# Patient Record
Sex: Male | Born: 1945 | Marital: Married | Smoking: Never smoker
Health system: Southern US, Community
[De-identification: ages and names within clinical notes are randomized; demographics above are authoritative.]

## PROBLEM LIST (undated history)

## (undated) DIAGNOSIS — D689 Coagulation defect, unspecified: Secondary | ICD-10-CM

## (undated) DIAGNOSIS — E119 Type 2 diabetes mellitus without complications: Secondary | ICD-10-CM

## (undated) DIAGNOSIS — H9190 Unspecified hearing loss, unspecified ear: Secondary | ICD-10-CM

## (undated) DIAGNOSIS — N186 End stage renal disease: Secondary | ICD-10-CM

## (undated) DIAGNOSIS — N189 Chronic kidney disease, unspecified: Secondary | ICD-10-CM

## (undated) DIAGNOSIS — E785 Hyperlipidemia, unspecified: Secondary | ICD-10-CM

## (undated) DIAGNOSIS — E039 Hypothyroidism, unspecified: Secondary | ICD-10-CM

## (undated) DIAGNOSIS — I10 Essential (primary) hypertension: Secondary | ICD-10-CM

## (undated) DIAGNOSIS — E079 Disorder of thyroid, unspecified: Secondary | ICD-10-CM

## (undated) DIAGNOSIS — K219 Gastro-esophageal reflux disease without esophagitis: Secondary | ICD-10-CM

## (undated) HISTORY — DX: Coagulation defect, unspecified: D68.9

## (undated) HISTORY — DX: Type 2 diabetes mellitus without complications: E11.9

## (undated) HISTORY — DX: Disorder of thyroid, unspecified: E07.9

## (undated) HISTORY — DX: Hyperlipidemia, unspecified: E78.5

## (undated) HISTORY — DX: Essential (primary) hypertension: I10

---

## 2004-02-25 ENCOUNTER — Emergency Department: Payer: Self-pay | Admitting: General Practice

## 2004-08-19 ENCOUNTER — Emergency Department: Payer: Self-pay | Admitting: Emergency Medicine

## 2004-09-29 ENCOUNTER — Ambulatory Visit: Payer: Self-pay | Admitting: General Surgery

## 2004-09-30 ENCOUNTER — Ambulatory Visit: Payer: Self-pay | Admitting: Internal Medicine

## 2005-12-30 ENCOUNTER — Ambulatory Visit: Payer: Self-pay | Admitting: Internal Medicine

## 2007-12-24 ENCOUNTER — Emergency Department: Payer: Self-pay | Admitting: Emergency Medicine

## 2010-01-07 ENCOUNTER — Ambulatory Visit: Payer: Self-pay | Admitting: Podiatry

## 2012-06-27 ENCOUNTER — Ambulatory Visit: Payer: Self-pay | Admitting: Ophthalmology

## 2012-07-05 ENCOUNTER — Ambulatory Visit: Payer: Self-pay | Admitting: Ophthalmology

## 2012-07-19 ENCOUNTER — Ambulatory Visit: Payer: Self-pay | Admitting: Ophthalmology

## 2012-08-02 ENCOUNTER — Ambulatory Visit: Payer: Self-pay | Admitting: Ophthalmology

## 2013-09-11 ENCOUNTER — Inpatient Hospital Stay: Payer: Self-pay | Admitting: Internal Medicine

## 2013-09-11 LAB — CBC WITH DIFFERENTIAL/PLATELET
BASOS PCT: 0.6 %
Basophil #: 0.1 10*3/uL (ref 0.0–0.1)
EOS PCT: 1 %
Eosinophil #: 0.2 10*3/uL (ref 0.0–0.7)
HCT: 41.7 % (ref 40.0–52.0)
HGB: 13.4 g/dL (ref 13.0–18.0)
Lymphocyte #: 1.5 10*3/uL (ref 1.0–3.6)
Lymphocyte %: 9.8 %
MCH: 27.2 pg (ref 26.0–34.0)
MCHC: 32.1 g/dL (ref 32.0–36.0)
MCV: 85 fL (ref 80–100)
MONOS PCT: 3.3 %
Monocyte #: 0.5 x10 3/mm (ref 0.2–1.0)
Neutrophil #: 13.3 10*3/uL — ABNORMAL HIGH (ref 1.4–6.5)
Neutrophil %: 85.3 %
PLATELETS: 280 10*3/uL (ref 150–440)
RBC: 4.92 10*6/uL (ref 4.40–5.90)
RDW: 15.2 % — AB (ref 11.5–14.5)
WBC: 15.6 10*3/uL — AB (ref 3.8–10.6)

## 2013-09-11 LAB — TROPONIN I
Troponin-I: <0.02 ng/mL
Troponin-I: <0.02 ng/mL

## 2013-09-11 LAB — COMPREHENSIVE METABOLIC PANEL
ALT: 39 U/L (ref 12–78)
ANION GAP: 5 — AB (ref 7–16)
Albumin: 3.7 g/dL (ref 3.4–5.0)
Alkaline Phosphatase: 134 U/L — ABNORMAL HIGH
BUN: 31 mg/dL — ABNORMAL HIGH (ref 7–18)
Bilirubin,Total: 0.4 mg/dL (ref 0.2–1.0)
CALCIUM: 9.4 mg/dL (ref 8.5–10.1)
CHLORIDE: 95 mmol/L — AB (ref 98–107)
CO2: 30 mmol/L (ref 21–32)
Creatinine: 2.64 mg/dL — ABNORMAL HIGH (ref 0.60–1.30)
EGFR (Non-African Amer.): 24 — ABNORMAL LOW
GFR CALC AF AMER: 28 — AB
Glucose: 407 mg/dL — ABNORMAL HIGH (ref 65–99)
OSMOLALITY: 284 (ref 275–301)
Potassium: 4 mmol/L (ref 3.5–5.1)
SGOT(AST): 32 U/L (ref 15–37)
SODIUM: 130 mmol/L — AB (ref 136–145)
TOTAL PROTEIN: 8.1 g/dL (ref 6.4–8.2)

## 2013-09-11 LAB — URINALYSIS, COMPLETE
Bacteria: NONE SEEN
Bilirubin,UR: NEGATIVE
Blood: NEGATIVE
Ketone: NEGATIVE
LEUKOCYTE ESTERASE: NEGATIVE
NITRITE: NEGATIVE
Ph: 7 (ref 4.5–8.0)
RBC, UR: NONE SEEN /HPF (ref 0–5)
SQUAMOUS EPITHELIAL: NONE SEEN
Specific Gravity: 1.016 (ref 1.003–1.030)
WBC UR: <1 /HPF (ref 0–5)

## 2013-09-11 LAB — CK TOTAL AND CKMB (NOT AT ARMC)
CK, TOTAL: 62 U/L
CK, Total: 53 U/L
CK-MB: 1.1 ng/mL (ref 0.5–3.6)
CK-MB: 1.3 ng/mL (ref 0.5–3.6)

## 2013-09-11 LAB — HEMOGLOBIN A1C: Hemoglobin A1C: 13.1 % — ABNORMAL HIGH (ref 4.2–6.3)

## 2013-09-12 DIAGNOSIS — N179 Acute kidney failure, unspecified: Secondary | ICD-10-CM

## 2013-09-12 LAB — BASIC METABOLIC PANEL
Anion Gap: 5 — ABNORMAL LOW (ref 7–16)
BUN: 28 mg/dL — AB (ref 7–18)
Calcium, Total: 8.7 mg/dL (ref 8.5–10.1)
Chloride: 102 mmol/L (ref 98–107)
Co2: 27 mmol/L (ref 21–32)
Creatinine: 2.23 mg/dL — ABNORMAL HIGH (ref 0.60–1.30)
EGFR (African American): 34 — ABNORMAL LOW
EGFR (Non-African Amer.): 29 — ABNORMAL LOW
Glucose: 369 mg/dL — ABNORMAL HIGH (ref 65–99)
Osmolality: 289 (ref 275–301)
Potassium: 4.4 mmol/L (ref 3.5–5.1)
Sodium: 134 mmol/L — ABNORMAL LOW (ref 136–145)

## 2013-09-12 LAB — CBC WITH DIFFERENTIAL/PLATELET
BASOS ABS: 0 10*3/uL (ref 0.0–0.1)
BASOS PCT: 0.4 %
EOS PCT: 0.1 %
Eosinophil #: 0 10*3/uL (ref 0.0–0.7)
HCT: 36.5 % — AB (ref 40.0–52.0)
HGB: 12 g/dL — AB (ref 13.0–18.0)
LYMPHS PCT: 12.7 %
Lymphocyte #: 1.2 10*3/uL (ref 1.0–3.6)
MCH: 27.6 pg (ref 26.0–34.0)
MCHC: 32.8 g/dL (ref 32.0–36.0)
MCV: 84 fL (ref 80–100)
MONOS PCT: 5.1 %
Monocyte #: 0.5 x10 3/mm (ref 0.2–1.0)
Neutrophil #: 7.7 10*3/uL — ABNORMAL HIGH (ref 1.4–6.5)
Neutrophil %: 81.7 %
Platelet: 241 10*3/uL (ref 150–440)
RBC: 4.34 10*6/uL — ABNORMAL LOW (ref 4.40–5.90)
RDW: 15.4 % — ABNORMAL HIGH (ref 11.5–14.5)
WBC: 9.4 10*3/uL (ref 3.8–10.6)

## 2013-09-12 LAB — CK TOTAL AND CKMB (NOT AT ARMC)
CK, Total: 51 U/L
CK-MB: 1 ng/mL (ref 0.5–3.6)

## 2013-09-12 LAB — URIC ACID: URIC ACID: 6.4 mg/dL (ref 3.5–7.2)

## 2013-09-12 LAB — HEMOGLOBIN A1C: Hemoglobin A1C: 13.2 % — ABNORMAL HIGH (ref 4.2–6.3)

## 2013-09-12 LAB — TROPONIN I: Troponin-I: <0.02 ng/mL

## 2013-09-13 LAB — PROTEIN / CREATININE RATIO, URINE
Creatinine, Urine: 120.1 mg/dL (ref 30.0–125.0)
PROTEIN, RANDOM URINE: 131 mg/dL — AB (ref 0–12)
Protein/Creat. Ratio: 1091 mg/gCREAT — ABNORMAL HIGH (ref 0–200)

## 2013-09-13 LAB — RENAL FUNCTION PANEL
Albumin: 2.8 g/dL — ABNORMAL LOW (ref 3.4–5.0)
Anion Gap: 4 — ABNORMAL LOW (ref 7–16)
BUN: 20 mg/dL — AB (ref 7–18)
Calcium, Total: 8.3 mg/dL — ABNORMAL LOW (ref 8.5–10.1)
Chloride: 110 mmol/L — ABNORMAL HIGH (ref 98–107)
Co2: 26 mmol/L (ref 21–32)
Creatinine: 1.84 mg/dL — ABNORMAL HIGH (ref 0.60–1.30)
EGFR (Non-African Amer.): 37 — ABNORMAL LOW
GFR CALC AF AMER: 43 — AB
Glucose: 148 mg/dL — ABNORMAL HIGH (ref 65–99)
OSMOLALITY: 285 (ref 275–301)
Phosphorus: 2.6 mg/dL (ref 2.5–4.9)
Potassium: 4.1 mmol/L (ref 3.5–5.1)
Sodium: 140 mmol/L (ref 136–145)

## 2013-09-13 LAB — TSH: Thyroid Stimulating Horm: 0.525 u[IU]/mL

## 2013-09-14 LAB — BASIC METABOLIC PANEL
Anion Gap: 5 — ABNORMAL LOW (ref 7–16)
BUN: 17 mg/dL (ref 7–18)
CALCIUM: 8.7 mg/dL (ref 8.5–10.1)
CHLORIDE: 108 mmol/L — AB (ref 98–107)
Co2: 26 mmol/L (ref 21–32)
Creatinine: 1.71 mg/dL — ABNORMAL HIGH (ref 0.60–1.30)
EGFR (Non-African Amer.): 41 — ABNORMAL LOW
GFR CALC AF AMER: 47 — AB
GLUCOSE: 146 mg/dL — AB (ref 65–99)
Osmolality: 282 (ref 275–301)
Potassium: 4.3 mmol/L (ref 3.5–5.1)
SODIUM: 139 mmol/L (ref 136–145)

## 2013-09-14 LAB — UR PROT ELECTROPHORESIS, URINE RANDOM

## 2013-10-03 ENCOUNTER — Ambulatory Visit: Payer: Self-pay | Admitting: Unknown Physician Specialty

## 2013-10-14 DIAGNOSIS — K5900 Constipation, unspecified: Secondary | ICD-10-CM | POA: Insufficient documentation

## 2014-03-15 ENCOUNTER — Ambulatory Visit: Payer: Self-pay | Admitting: Unknown Physician Specialty

## 2014-05-28 ENCOUNTER — Ambulatory Visit: Payer: Self-pay | Admitting: Internal Medicine

## 2014-05-31 DIAGNOSIS — G301 Alzheimer's disease with late onset: Secondary | ICD-10-CM | POA: Insufficient documentation

## 2014-08-13 ENCOUNTER — Ambulatory Visit: Payer: Self-pay | Admitting: Internal Medicine

## 2014-09-13 DIAGNOSIS — I2699 Other pulmonary embolism without acute cor pulmonale: Secondary | ICD-10-CM | POA: Insufficient documentation

## 2014-09-14 NOTE — Op Note (Signed)
PATIENT NAME:  Joseph Mcpherson, Joseph Mcpherson MR#:  [id number] DATE OF BIRTH:  20-Mar-1946  DATE OF PROCEDURE:  08/02/2012  PREOPERATIVE DIAGNOSIS: Visually significant cataract of the right eye.   POSTOPERATIVE DIAGNOSIS: Visually significant cataract of the right eye.   OPERATIVE PROCEDURE: Cataract extraction by phacoemulsification with implant of intraocular lens to right eye.   SURGEON: Birder Robson, MD.   ANESTHESIA:  1. Managed anesthesia care.  2. Topical tetracaine drops followed by 2% Xylocaine jelly applied in the preoperative holding area.   COMPLICATIONS: None.   TECHNIQUE:  Stop and chop.    DESCRIPTION OF PROCEDURE: The patient was examined and consented in the preoperative holding area where the aforementioned topical anesthesia was applied to the right eye and then brought back to the Operating Room where the right eye was prepped and draped in the usual sterile ophthalmic fashion and a lid speculum was placed. A paracentesis was created with the side port blade and the anterior chamber was filled with viscoelastic. A near clear corneal incision was performed with the steel keratome. A continuous curvilinear capsulorrhexis was performed with a cystotome followed by the capsulorrhexis forceps. Hydrodissection and hydrodelineation were carried out with BSS on a blunt cannula. The lens was removed in a stop and chop technique and the remaining cortical material was removed with the irrigation-aspiration handpiece. The capsular bag was inflated with viscoelastic and the Tecnis ZCB00 20.0-diopter lens, serial [id number] was placed in the capsular bag without complication. The remaining viscoelastic was removed from the eye with the irrigation-aspiration handpiece. The wounds were hydrated. The anterior chamber was flushed with Miostat and the eye was inflated to physiologic pressure. 0.1 mL of cefuroxime concentration 10 mg/mL was placed in the anterior chamber. The wounds were found to be water  tight. The eye was dressed with Vigamox. The patient was given protective glasses to wear throughout the day and a shield with which to sleep tonight. The patient was also given drops with which to begin a drop regimen today and will follow-up with me in one day.     ____________________________ Livingston Diones. Porfilio, MD wlp:cc D: 08/02/2012 21:55:02 ET T: 08/02/2012 23:45:29 ET JOB#: [id number]  cc: William L. Porfilio, MD, <Dictator> Livingston Diones PORFILIO MD ELECTRONICALLY SIGNED 08/05/2012 17:10

## 2014-09-14 NOTE — Op Note (Signed)
PATIENT NAME:  Joseph Mcpherson, Joseph Mcpherson MR#:  [id number] DATE OF BIRTH:  03/23/1946  DATE OF PROCEDURE:  07/05/2012  PREOPERATIVE DIAGNOSIS: Visually significant cataract of the left eye.   POSTOPERATIVE DIAGNOSIS: Visually significant cataract of the left eye.   OPERATIVE PROCEDURE: Cataract extraction by phacoemulsification with implant of intraocular lens to left eye.   SURGEON: Birder Robson, MD.   ANESTHESIA:  1. Managed anesthesia care.  2. Topical tetracaine drops followed by 2% Xylocaine jelly applied in the preoperative holding area.   COMPLICATIONS: None.   TECHNIQUE:  Stop and chop.   DESCRIPTION OF PROCEDURE: The patient was examined and consented in the preoperative holding area where the aforementioned topical anesthesia was applied to the left eye and then brought back to the Operating Room where the left eye was prepped and draped in the usual sterile ophthalmic fashion and a lid speculum was placed. A paracentesis was created with the side port blade and the anterior chamber was filled with viscoelastic. A near clear corneal incision was performed with the steel keratome. A continuous curvilinear capsulorrhexis was performed with a cystotome followed by the capsulorrhexis forceps. Hydrodissection and hydrodelineation were carried out with BSS on a blunt cannula. The lens was removed in a stop and chop technique and the remaining cortical material was removed with the irrigation-aspiration handpiece. The capsular bag was inflated with viscoelastic and the Alcon SN60WF Tecnis ZCB00 20.0-diopter lens, serial number [id number] was placed in the capsular bag without complication. The remaining viscoelastic was removed from the eye with the irrigation-aspiration handpiece. The wounds were hydrated. The anterior chamber was flushed with Miostat and the eye was inflated to physiologic pressure. 0.1 mL of cefuroxime concentration 10 mg/mL was placed in the anterior chamber. The wounds were found to  be water tight. The eye was dressed with Vigamox. The patient was given protective glasses to wear throughout the day and a shield with which to sleep tonight. The patient was also given drops with which to begin a drop regimen today and will follow-up with me in one day.      ____________________________ Livingston Diones. Dalonte Hardage, MD wlp:ea D: 07/05/2012 22:52:51 ET T: 07/06/2012 04:12:17 ET JOB#: [id number]  cc: Avedis Bevis L. Tyeisha Dinan, MD, <Dictator> Livingston Diones Amado Andal MD ELECTRONICALLY SIGNED 07/18/2012 11:53

## 2014-09-15 NOTE — Discharge Summary (Signed)
Dates of Admission and Diagnosis:  Date of Admission 11-Sep-2013   Date of Discharge 14-Sep-2013   Admitting Diagnosis Uncontrolled DM   Final Diagnosis Uncontrolled DM Hypertensive Urgency ARF due to dehydration nausea and vomiting    Chief Complaint/History of Present Illness The patient is a 69 year old male with a history of diabetes mellitus, insulin-dependent; hypertension; hyperlipidemia presented to the Emergency Department with the complaints of dizziness. The patient was feeling well last night. Woke up this morning, started to experience dizziness. Tried to walk to the kitchen, extremely wobbly. In the kitchen, fell forward.  He started experiencing nausea. Had 2 episodes of vomiting.  We checked his blood sugars.  It was found to be 109.  As the patient continued to have these symptoms, he came to the Emergency Department.   WORKUP IN THE EMERGENCY DEPARTMENT:  Patient is found to have multiple abnormalities.  Patient's blood pressure is 200/95. Blood sugars of 409. The patient is also found to have acute renal failure with a creatinine of 2.64, sodium of 130. The patient does not know patient's baseline creatinine.  We do not have any labs from any previous visits.  CT head without contrast: No acute intracranial abnormality. The patient denies having any fever.  Patient was found to have a white blood cell count of 15.6 with a left shift; however, no obvious signs of infection are noted. Chest x-ray does not show any infiltrates. Urinalysis: Negative for any urinary tract infection. The patient is afebrile in the Emergency Department.   Allergies:  No Known Allergies:   Hepatic:  20-Apr-15 16:07   Albumin, Serum 3.7  Bilirubin, Total 0.4  Alkaline Phosphatase  134 (45-117 NOTE: New Reference Range 04/14/13)  SGPT (ALT) 39  SGOT (AST) 32  Total Protein, Serum 8.1  Cardiology:  21-Apr-15 08:15   Echo Doppler REASON FOR EXAM:     COMMENTS:     PROCEDURE: [place] - ECHO  DOPPLER COMPLETE(TRANSTHOR)  - Sep 12 2013  8:15AM   RESULT: Echocardiogram Report  Patient Name:   Joseph Mcpherson Date of Exam: 09/12/2013 Medical Rec #:  [id number]      Custom1: Date of Birth:  06-17-1945    Height:       66.0 in Patient Age:    32 years    Weight:       150.0 lb Patient Gender: M           BSA:          1.77 m??  Indications: CVA Sonographer:    Sherrie Sport RDCS Referring Phys: Monica Becton  Summary:  1. No source of CVA noted.  2. Left ventricular ejection fraction, by visual estimation, is 60 to  65%.  3. Normal global left ventricular systolic function.  4. Impaired relaxation pattern of LV diastolic filling.  5. Normal right ventricular size andsystolic function.  6. Normal RVSP 2D AND M-MODE MEASUREMENTS (normal ranges within parentheses): Left Ventricle:          Normal IVSd (2D):      1.18 cm (0.7-1.1) LVPWd (2D):     1.29 cm (0.7-1.1) Aorta/LA:                  Normal LVIDd (2D):   3.77 cm (3.4-5.7) Aortic Root (2D): 2.90 cm (2.4-3.7) LVIDs (2D):     2.37 cm           Left Atrium (2D): 3.30 cm (1.9-4.0) LV FS (2D):     37.1 %   (>  25%) LV EF (2D):     67.9 %   (>50%)                                   Right Ventricle:                               RVd (2D):        1.61 cm LV DIASTOLIC FUNCTION: MV Peak E: 0.79 m/s E/e' Ratio: 9.20 MV Peak A: 1.08 m/s Decel Time: 227 msec E/A Ratio: 0.73 SPECTRAL DOPPLER ANALYSIS (where applicable): Mitral Valve: MV P1/2 Time: 65.83 msec MV Area, PHT: 3.34 cm?? Aortic Valve: AoV Max Vel: 1.12 m/s AoV Peak PG: 5.0 mmHg AoV Mean PG: LVOT Vmax: 0.90 m/s LVOT VTI:  LVOT Diameter: 2.00 cm AoV Area, Vmax: 2.52 cm?? AoV Area, VTI:  AoV Area, Vmn: Tricuspid Valve and PA/RV Systolic Pressure: TR Max Velocity: 1.82 m/s RA  Pressure: 5 mmHg RVSP/PASP: 18.2 mmHg Pulmonic Valve: PV Max Velocity: 1.10 m/s PV Max PG: 4.8 mmHg PV Mean PG:  PHYSICIAN INTERPRETATION: Left Ventricle: The left ventricular internal cavity size was  normal. LV  posterior wall thickness was normal. No left ventricular hypertrophy.  Global LV systolic function was normal. Left ventricular ejection  fraction, by visual estimation, is 60 to 65%. Spectral Doppler shows  impaired relaxation pattern of LV diastolic filling. Right Ventricle: Normal right ventricular size, wall thickness, and  systolic function. The right ventricular size is normal. Global RV  systolic function is normal. Left Atrium: The left atrium is normal in size. Right Atrium: The right atrium is normal in size. Pericardium: There is no evidence of pericardial effusion. Mitral Valve: The mitral valve is normal in structure. Trace mitral valve  regurgitation is seen. Tricuspid Valve: The tricuspid valve is normal. Trivial tricuspid  regurgitation is visualized. The tricuspid regurgitant velocity is 1.82  m/s, and with an assumed right atrial pressure of 5 mmHg, the estimated  right ventricular systolic pressure is normal at 18.2 mmHg. Pulmonic Valve: The pulmonic valve is normal. Trace pulmonic valve  regurgitation.  [id number] Ida Rogue MD Electronically signed by [id number] Ida Rogue MD Signature Date/Time: 09/12/2013/4:50:38 PM *** Final ***  IMPRESSION: .    Verified By: Minna Merritts, M.D., MD  Routine Chem:  20-Apr-15 16:07   Glucose, Serum  407  BUN  31  Creatinine (comp)  2.64  Sodium, Serum  130  Potassium, Serum 4.0  Chloride, Serum  95  CO2, Serum 30  Calcium (Total), Serum 9.4  Anion Gap  5  Osmolality (calc) 284  eGFR (African American)  28  eGFR (Non-African American)  24 (eGFR values <58m/min/1.73 m2 may be an indication of chronic kidney disease (CKD). Calculated eGFR is useful in patients with stable renal function. The eGFR calculation will not be reliable in acutely ill patients when serum creatinine is changing rapidly. It is not useful in  patients on dialysis. The eGFR calculation may not be applicable to patients at the low  and high extremes of body sizes, pregnant women, and vegetarians.)  Hemoglobin A1c ([facility])  13.1 (The American Diabetes Association recommends that a primary goal of therapy should be <7% and that physicians should reevaluate the treatment regimen in patients with HbA1c values consistently >8%.)  23-Apr-15 05:34   Glucose, Serum  146  BUN 17  Creatinine (comp)  1.71  Sodium, Serum 139  Potassium, Serum 4.3  Chloride, Serum  108  CO2, Serum 26  Calcium (Total), Serum 8.7  Anion Gap  5  Osmolality (calc) 282  eGFR (African American)  47  eGFR (Non-African American)  41 (eGFR values <46m/min/1.73 m2 may be an indication of chronic kidney disease (CKD). Calculated eGFR is useful in patients with stable renal function. The eGFR calculation will not be reliable in acutely ill patients when serum creatinine is changing rapidly. It is not useful in  patients on dialysis. The eGFR calculation may not be applicable to patients at the low and high extremes of body sizes, pregnant women, and vegetarians.)  Cardiac:  20-Apr-15 16:07   CK, Total 62 (39-308 NOTE: NEW REFERENCE RANGE  06/26/2013)  CPK-MB, Serum 1.1 (Result(s) reported on 11 Sep 2013 at 11:02PM.)  Troponin I < 0.02 (0.00-0.05 0.05 ng/mL or less: NEGATIVE  Repeat testing in 3-6 hrs  if clinically indicated. >0.05 ng/mL: POTENTIAL  MYOCARDIAL INJURY. Repeat  testing in 3-6 hrs if  clinically indicated. NOTE: An increase or decrease  of 30% or Mcpherson on serial  testing suggests a  clinically important change)  Routine Hem:  20-Apr-15 16:07   WBC (CBC)  15.6  RBC (CBC) 4.92  Hemoglobin (CBC) 13.4  Hematocrit (CBC) 41.7  Platelet Count (CBC) 280  MCV 85  MCH 27.2  MCHC 32.1  RDW  15.2  Neutrophil % 85.3  Lymphocyte % 9.8  Monocyte % 3.3  Eosinophil % 1.0  Basophil % 0.6  Neutrophil #  13.3  Lymphocyte # 1.5  Monocyte # 0.5  Eosinophil # 0.2  Basophil # 0.1 (Result(s) reported on 11 Sep 2013 at 04:42PM.)    PERTINENT RADIOLOGY STUDIES: [place]    21-Apr-15 09:30, [place]  [place]   REASON FOR EXAM:    TIA  COMMENTS:       PROCEDURE: [place] - [place] DOPPL[place] BILATERAL  - Sep 12 2013  9:30AM     CLINICAL DATA:  TIA.    EXAM:  BILATERAL CAROTID DUPLEX ULTRASOUND    TECHNIQUE:  GPearline Cablesscale imaging, color Doppler and duplex ultrasound were  performed of bilateral carotid and vertebral arteries in the neck.    COMPARISON:  None.  FINDINGS:  Criteria: Quantification of carotid stenosis is based on velocity  parameters that correlate the residual internal carotid diameter  with NASCET-based stenosis levels, using the diameter of the distal  internal carotid lumen as the denominator for stenosis measurement.    The following velocity measurements were obtained:    RIGHT    ICA:  124/31 cm/sec    CCA:  1321/2cm/sec    SYSTOLIC ICA/CCA RATIO:  1.2  DIASTOLIC ICA/CCA RATIO:  3.6    ECA:  88 cm/sec    LEFT    ICA:  107/27 cm/sec    CCA:  924/82cm/sec    SYSTOLIC ICA/CCA RATIO:  1.1    DIASTOLIC ICA/CCA RATIO:  2.1    ECA:  95 cm/sec  RIGHT CAROTID ARTERY: Minimal calcified plaqueright carotid  bifurcation. No significant stenosis.    RIGHT VERTEBRAL ARTERY:  Patent with antegrade flow.    LEFT CAROTID ARTERY: Minimal calcified plaque left carotid  bifurcation. No significant stenosis .    LEFT VERTEBRAL ARTERY:  Patent with antegrade flow.     IMPRESSION:  1. Minimal bilateral carotid atherosclerotic vascular disease, no  flow limiting stenosis.  2. Vertebrals are patent with antegrade flow.  Electronically Signed    By: Marcello Moores  Register    On: 09/12/2013 09:58         Verified By: Osa Craver, M.D., MD    21-Apr-15 15:23, US Kidney Bilateral  US Kidney Bilateral   REASON FOR EXAM:    ARF, CKD  COMMENTS:       PROCEDURE: [place]  - US KIDNEY  - Sep 12 2013  3:23PM     CLINICAL DATA:  Acute renal  failure.    EXAM:  RENAL/URINARY TRACT ULTRASOUND COMPLETE    COMPARISON:  None.    FINDINGS:  Right Kidney:  Length: 9.3 cm. There is increased cortical echogenicity. No stone,  mass or hydronephrosis.    Left Kidney:    Length: 9.9 cm. There is increased cortical echogenicity. No stone,  mass or hydronephrosis    Bladder:    Appears normal for degree of bladder distention.     IMPRESSION:  Findings compatible with medical renal disease. Negative for  hydronephrosis or other acute abnormality.  Electronically Signed    By: Inge Rise M.D.    On: 09/12/2013 16:40         Verified By: Ramond Dial, M.D.,  CT:    20-Apr-15 23:29, CT Head Without Contrast  CT Head Without Contrast   REASON FOR EXAM:    dizziness  COMMENTS:       PROCEDURE: CT  - CT HEAD WITHOUT CONTRAST  - Sep 11 2013 11:29PM     CLINICAL DATA:  Dizziness, hyperglycemia, vomiting.    EXAM:  CT HEAD WITHOUT CONTRAST    TECHNIQUE:  Contiguous axial images were obtained from the base of the skull  through the vertex without intravenous contrast.    COMPARISON:  None.  FINDINGS:  Moderate ventriculomegaly, likely on the basis of global parenchymal  brain volume loss as there is overall commensurate enlargement of  the cerebral sulci and cerebellar folia. No intraparenchymal  hemorrhage, mass effect nor midline shift. Patchy supratentorial  white matter hypodensities are within normal range for patient's age  and though non-specific suggest sequelae of chronic small vessel  ischemic disease. No acute large vascular territory infarcts. Small  area of right mesial occipital lobe cortical/subcortical  encephalomalacia.    No abnormal extra-axial fluid collections. Probable subcentimeter  pineal cyst. Basal cisterns are patent. Moderate calcific  atherosclerosis of the carotid siphons and included vertebral  arteries.  No skull fracture. The included ocular globes and orbital  contents  are non-suspicious. While left anterior ethmoid mucosal thickening  without paranasal sinus air-fluid levels. The mastoid air cells are  well aerated.     IMPRESSION:  No acute intracranial process. 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    Moderate global brain atrophy. Mild white matter changes suggest  chronic small vessel ischemic disease.      Electronically Signed    By: Elon Alas    On: 09/12/2013 02:43         Verified By: Ricky Ala, M.D.,   Pertinent Past History:  Pertinent Past History 1. Hypertension.  2. Hyperlipidemia.  3. Diabetes mellitus, insulin-dependent, poorly controlled with a hemoglobin A1c of 13.1.  4. Gout. 5. Hypothyroidism.   Hospital Course:  Hospital Course ASSESSMENT AND PLAN: This patient is a 69 year old male with history of diabetes mellitus, poorly controlled; hypertension who comes to the Emergency Department with poorly controlled diabetes mellitus, accelerated hypertension, and renal insufficiency.  1. Dizziness. 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 feels better now. 2. Acute renal failure. continued ivf - slow improvement- appreciated Nephrology help. follow in clinic. 3. Diabetes mellitus, insulin-dependent.  poor control on multiple meds levemir, on levmir 35 untis, needs diabetic teaching, endo consult appreciated- started on Aspart 5 units TID- BS was 54 in one morning- so - decreased Levemit to 25 units at night. and finally discharged with only one time aspart 6 units every day with biggest meal- Dinner.  advised pt about keeping record of blood sugar 4 times a day and regular folow up with Dr. Gabriel Carina. 4. Hypertension, accelerated.  on verapmil bp was still elevated added clonidine- later on reasonabaly controlled. added  hydralazine. 5. Hypothyroidism. Continued the Synthroid.   Condition on Discharge Stable   Code Status:  Code Status Full Code   DISCHARGE INSTRUCTIONS HOME MEDS:  Medication Reconciliation: Patient's Home Medications at Discharge:     Medication Instructions  simvastatin 20 mg oral tablet  1 tab(s) orally once a day (at bedtime) for cholesterol   allopurinol 100 mg oral tablet  1 tab(s) orally once a day    levothyroxine 75 mcg (0.075 mg) oral tablet  1 tab(s) orally once a day (in the morning)   omeprazole 20 mg oral delayed release capsule  1 cap(s) orally once a day   metoprolol succinate 50 mg oral tablet, extended release  1 tab(s) orally once a day   verapamil 240 mg/12 hours oral tablet, extended release  1 tab(s) orally once a day   glipizide 10 mg oral tablet  1 tab(s) orally once a day (in the evening)   januvia 100 mg oral tablet  1 tab(s) orally once a day (in the evening)   levemir flextouch 100 units/ml subcutaneous solution  28 unit(s) subcutaneous once a day (in the evening)   clonidine 0.2 mg oral tablet  1 tab(s) orally 2 times a day   novolog flexpen 100 units/ml subcutaneous solution  6 unit(s) subcutaneous once a day- before dinner   amlodipine 10 mg oral tablet  1 tab(s) orally once a day   hydralazine 10 mg oral tablet  1 tab(s) orally every 12 hours    STOP TAKING THE FOLLOWING MEDICATION(S):    furosemide 20 mg oral tablet: 1 tab(s) orally once a day hydrochlorothiazide-valsartan: 1 tab(s) orally once a day hydrochlorothiazide-valsartan 12.5 mg-160 mg oral tablet: 1 tab(s) orally once a day  Physician's Instructions:  Diet Low Sodium  Carbohydrate Controlled (ADA) Diet   Activity Limitations As tolerated   Return to Work Not Applicable   Time frame for Follow Up Appointment 1-2 weeks  Dr. Gabriel Carina   Other Comments 1 week in Dr. Valda Favia clinic. 2-3 weeks appointment in Dr. Keturah Barre office. Keep a record of blood sugar daily for 3-4 times and take it  to Dr. Gabriel Carina.   Electronic Signatures: Vaughan Basta (MD)  (Signed 25-Apr-15 18:49)  Authored: ADMISSION DATE AND DIAGNOSIS, CHIEF COMPLAINT/HPI, Allergies, PERTINENT LABS, PERTINENT RADIOLOGY STUDIES, PERTINENT PAST HISTORY, HOSPITAL COURSE, DISCHARGE INSTRUCTIONS HOME MEDS, PATIENT INSTRUCTIONS   Last Updated: 25-Apr-15 18:49 by Vaughan Basta (MD)

## 2014-09-15 NOTE — H&P (Signed)
PATIENT NAMEMAMON, Joseph Mcpherson MR#:  [id number] DATE OF BIRTH:  August 16, 1945  DATE OF ADMISSION:  09/11/2013  PRIMARY CARE PHYSICIAN: Dr. [name].   REFERRING PHYSICIAN: Dr. Harvest Dark.   CHIEF COMPLAINT: Dizziness.   HISTORY OF PRESENT ILLNESS: The patient is a 69 year old male with a history of diabetes mellitus, insulin-dependent; hypertension; hyperlipidemia presented to the Emergency Department with the complaints of dizziness. The patient was feeling well last night. Woke up this morning, started to experience dizziness. Tried to walk to the kitchen, extremely wobbly. In the kitchen, fell forward.  He started experiencing nausea. Had 2 episodes of vomiting.  We checked his blood sugars.  It was found to be 109.  As the patient continued to have these symptoms, he came to the Emergency Department.   WORKUP IN THE EMERGENCY DEPARTMENT:  Patient is found to have multiple abnormalities.  Patient's blood pressure is 200/95. Blood sugars of 409. The patient is also found to have acute renal failure with a creatinine of 2.64, sodium of 130. The patient does not know patient's baseline creatinine.  We do not have any labs from any previous visits.  CT head without contrast: No acute intracranial abnormality. The patient denies having any fever.  Patient was found to have a white blood cell count of 15.6 with a left shift; however, no obvious signs of infection are noted. Chest x-ray does not show any infiltrates. Urinalysis: Negative for any urinary tract infection. The patient is afebrile in the Emergency Department.   PAST MEDICAL HISTORY:  1. Hypertension.  2. Hyperlipidemia.  3. Diabetes mellitus, insulin-dependent, poorly controlled with a hemoglobin A1c of 13.1.  4. Gout. 5. Hypothyroidism.   ALLERGIES:  No known drug allergies.   HOME MEDICATIONS:  1. Verapamil 240 mg every 12 hours once a day.  2. Simvastatin 20 mg once a day.  3. Omeprazole 20 mg once a day. 4. Toprol-XL 50 mg  once a day.  5. Levothyroxine 75 mg once a day. 6. Levemir 15 units at bedtime.  7. Januvia 100 mg once a day.  8. Hydrochlorothiazide losartan 12.5/  mg once a day.  9. Glipizide 10 mg once a day.  10. Lasix 20 mg once a day.  11. Allopurinol 100 mg once a day.   SOCIAL HISTORY: No history of smoking. Drank alcohol in the past. Denies using any illicit drugs. Married, lives with his wife.   FAMILY HISTORY: Diabetes mellitus and heart problems.   REVIEW OF SYSTEMS:  CONSTITUTIONAL: Denies any generalized weakness.  EYES: No change in vision.  ENT: No change in hearing. RESPIRATORY: No cough, shortness of breath.  CARDIOVASCULAR: No chest pain, palpations.  GASTROINTESTINAL: Was nauseated this morning. No diarrhea. GENITOURINARY:  No dysuria or hematuria.  ENDOCRINE: Diagnosis of diabetes mellitus.  HEMATOLOGIC: No easy bruising or bleeding.  SKIN: No rashes or lesions.  MUSCULOSKELETAL: No joint pains and aches.  NEUROLOGIC: No weakness or numbness in any part of the body. Experienced dizziness.   PHYSICAL EXAMINATION:  GENERAL: This is a well-built and well-nourished age-appropriate male lying down in the bed, not in distress.  VITAL SIGNS: Temperature 97.6, pulse 98, blood pressure 183/99, respiratory rate of 16, oxygen saturation is 97% on room air.  HEENT: Head normocephalic, atraumatic.  No scleral icterus. Conjunctivae normal. Pupils equal and react to light. Extraocular movements are intact. Mucous membranes are moist. No pharyngeal erythema.  NECK: Supple. No lymphadenopathy. No JVD. No carotid bruit.  CHEST: Has no focal tenderness.  LUNGS: Bilaterally clear to auscultation.  HEART: S1, S2 regular. No murmurs are heard.  ABDOMEN: Bowel sounds present. Soft, nontender, nondistended. No hepatosplenomegaly.  EXTREMITIES: No pedal edema. Pulses 2+.  SKIN: No rash or lesions.  MUSCULOSKELETAL: Good range of motion in all the extremities.  NEUROLOGICAL:  The patient is  alert, oriented to place, person, and time. Cranial nerves II-XII are intact.  Motor 5/5 in upper and lower extremities.   LABORATORY DATA: UA negative for nitrites and leukocyte esterase. Chest x-ray, PA and lateral, no acute cardiopulmonary disease. Glucose 409, hemoglobin A1c 13.1. CMP: BUN 31 and a creatinine of 2.64. Sodium of 130, WBC 15.6, hemoglobin 13.4, platelet count of 280,000. Troponin is less than 0.02.   ASSESSMENT AND PLAN: This patient is a 69 year old male with history of diabetes mellitus, poorly controlled; hypertension who comes to the Emergency Department with poorly controlled diabetes mellitus, accelerated hypertension, and renal insufficiency.  1. Dizziness. Considering the patient's risk factors, we will rule out a stroke with a CT head without contrast and MRI of the brain.  We will also obtain carotid Dopplers and echocardiogram. Keep the patient on aspirin.  2. Acute renal failure. We do not have the patient's baseline.  We will hold hydrochlorothiazide and losartan. Continue with IV fluids and followup.  3. Diabetes mellitus, insulin-dependent. The patient has poorly controlled diabetes mellitus. Hemoglobin A1c is 13.1.  We are concerned about noncompliant with the medication. We will hold all oral medications for now.  4. Hypertension, accelerated. Again concern about patient maybe having poorly controlled diabetes mellitus. Continue with Verapamil.  Also added Norvasc.  We had to hold Lasix and valsartan. We will also keep the patient on p.r.n. medications for systolic blood pressure greater than 160 with labetalol and hydralazine.  5. Hypothyroidism. Continue the Synthroid. Check the TSH level.  6. Keep the patient on deep vein thrombosis prophylaxis with heparin.   TIME SPENT: 50 minutes.     ____________________________ Monica Becton, MD pv:dd D: 09/11/2013 22:43:54 ET T: 09/11/2013 23:50:37 ET JOB#: [id number]  cc: Monica Becton, MD, <Dictator> [name], MD  Monica Becton MD ELECTRONICALLY SIGNED 09/21/2013 21:27

## 2014-09-15 NOTE — Consult Note (Signed)
Allergies:  No Known Allergies:   Assessment/Plan:  Assessment/Plan 69 yo M admitted yesterday with dizziness and nausea seen in consultation for uncontrolled diabetes.  He was seen, examined, and chart reviewed. Interview pt and family at bedside.   Hgb A1c is 13%. Sugars yesterday in the 400s, better today and in the 170-210 range. No acute complaints.  A/ Chronically severely uncontrolled diabetes  P/ Agree with current diabetes regimen. In addition to home meds, NovoLog 6 units tid AC + SSI was added today. Needs diabetes education. No changes recommended at this time. Will follow along with you and arrange for out-patient follow-up.   Case Discussed With patient, family   Electronic Signatures: Judi Cong (MD)  (Signed 21-Apr-15 17:06)  Authored: ALLERGIES, Assessment/Plan   Last Updated: 21-Apr-15 17:06 by Judi Cong (MD)

## 2014-09-15 NOTE — Consult Note (Signed)
PATIENT NAME:  Joseph Mcpherson, Joseph Mcpherson MR#:  [id number] DATE OF BIRTH:  10/30/1945  DATE OF CONSULTATION:  09/12/2013  REFERRING PHYSICIAN:  Shreyang H. Posey Pronto, MD CONSULTING PHYSICIAN:  A. Lavone Orn, MD  CHIEF COMPLAINT: Uncontrolled diabetes.   HISTORY OF PRESENT ILLNESS: This is a 69 year old male with type 2 diabetes, hypertension, hyperlipidemia, gout, who presented yesterday to the ED with dizziness. The patient also has nausea with vomiting, was found to have elevated blood pressure of 200/95, elevated blood sugar of 409, and acute renal failure with a creatinine of 2.64 and hyponatremia with a sodium of 130. He had mild leukocytosis with a WBC of 15.6. No abnormalities noted on chest x-ray. The patient was admitted and initiated on IV fluids. His diuretic was held. His outpatient medications for diabetes including Januvia, glipizide, and Lantus were continued. Today, NovoLog 6 units t.i.d. in addition to NovoLog sliding scale were added. He was found to have a hemoglobin A1c of 13.1% consistent with uncontrolled diabetes. Blood pressure agents were adjusted. Today, blood sugars are much improved and in the 170 to 213 range. The patient's family at the bedside were interviewed and provided greater than 50% of the history. They indicate he has not been eating well. Only thing to eat most of the day today was Jell-O. The patient denies poor appetite. He reports nausea is improved. He reports when he is resting, he has no further dizziness.   He claims to have had a history of diabetes for at least 20 years. Diabetes is managed by his primary care provider, Dr. Lavera Guise, although he admits he has not been in to see Dr. Lavera Guise in quite some time. Outpatient diabetes medications include Levemir 35 units at bedtime, Januvia 100 mg daily, glipizide 10 mg daily. He does not check blood sugars. He does have a glucometer. He denies numbness or tingling in the feet. He denies blurred vision. He denies polyuria or  increased thirst. He denies recent weight loss.   PAST MEDICAL HISTORY: 1.  Type 2 diabetes.  2.  Hypertension.  3.  Hyperlipidemia.  4.  Gout.  5.  Hypothyroidism.   ALLERGIES: No known drug allergies.   CURRENT MEDICATIONS: 1.  Levemir 35 units at bedtime.  2.  NovoLog 6 units t.i.d. a.c.  3.  Allopurinol 100 mg daily.  4.  Amlodipine 10 mg daily.  5.  Metoprolol ER 50 mg daily.  6.  Verapamil ER 240 mg daily.  7.  Aspirin 81 mg daily.  8.  Heparin 5000 units subcutaneous q.8 hours.  9.  Glipizide 10 mg daily.  10.  0.9% normal saline at 100 mL/hour.   SOCIAL HISTORY: The patient lives with his wife. Denies use of tobacco and alcohol.   FAMILY HISTORY: Positive for diabetes.   REVIEW OF SYSTEMS:    GENERAL: Denies weight loss. Denies fever.  HEENT: Denies blurred vision. Denies sore throat.  NECK: Denies neck pain. Denies dysphagia.  CARDIAC: Denies chest pain or palpitation.  PULMONARY: Denies cough or shortness of breath.  ABDOMEN: Nausea has largely resolved. Denies recent change in bowel habits. Reports good appetite.  EXTREMITIES: Denies leg swelling. Reports no lower extremity weakness.  SKIN: Denies rash or recent skin changes. ENDOCRINE: Denies heat or cold intolerance.  NEUROLOGIC: Again, he has had dizziness. Denies recent falls.  HEMATOLOGIC: Denies easy bruisability or recent bleeding.   PHYSICAL EXAMINATION: VITAL SIGNS: Height 65.9 inches, weight 153.3 pounds. Temperature 98.2, pulse 61, respiratory rate 20, blood pressure 137/81.  GENERAL: This  is an [place] male lying in bed in no acute distress, appears stated age.  HEENT: EOMI. Oropharynx is clear. No proptosis or lid lag. Mucous membranes moist.  NECK: Supple. No thyromegaly. No neck tenderness to palpation.  CARDIAC: Regular rate and rhythm. No carotid bruit.  LUNGS: Clear to auscultation bilaterally. No wheeze. Good inspiratory effort.  ABDOMEN: Diffusely soft, nontender.  EXTREMITIES: No  peripheral edema is present.  SKIN: No rash or dermatopathy is present. A bilateral barefoot exam shows no calluses or lesions. No ulcerations.  NEUROLOGIC: No sensory deficits noted.  PSYCHIATRIC: Alert and oriented, calm and cooperative.   LABORATORY DATA: Glucose 369, BUN 28, creatinine 2.2, sodium 134, potassium 4.4, chloride 102, bicarbonate 27, eGFR 34. Hemoglobin A1c 13.2%. Troponin I less than 0.02 x 3. Hematocrit 36.5%. WBC 9.4, platelets 241.   ASSESSMENT: A 69 year old male with uncontrolled diabetes and renal insufficiency.   RECOMMENDATIONS: 1.  Agree with current medications to include Januvia, glipizide, Lantus, and NovoLog insulin. We may need to consider reducing Januvia to be renally dosed if his eGFR does not improve. No changes are to be made at this time. I suspect the addition of mealtime insulin will be very beneficial. We will need further time and resumption of normal eating habits to see if his regimen needs to be adjusted further.  2.  Agree that patient and  family would benefit from diabetes education. The clinical diabetes educators have been consulted.  3.  Recommended to patient that he monitor his blood sugars regularly. Recommend blood sugar checks 3 times daily. Discussed blood sugar goals.  4.  Will follow along with you and will arrange for outpatient clinic followup as well.   Thank you for the kind request for consultation.   ____________________________ A. Lavone Orn, MD ams:jcm D: 09/12/2013 17:16:16 ET T: 09/12/2013 18:53:24 ET JOB#: [id number]  cc: A. Lavone Orn, MD, <Dictator> Sherlon Handing MD ELECTRONICALLY SIGNED 09/21/2013 12:58

## 2014-11-19 DIAGNOSIS — Z794 Long term (current) use of insulin: Secondary | ICD-10-CM | POA: Insufficient documentation

## 2014-11-19 DIAGNOSIS — E119 Type 2 diabetes mellitus without complications: Secondary | ICD-10-CM | POA: Insufficient documentation

## 2014-11-27 DIAGNOSIS — I10 Essential (primary) hypertension: Secondary | ICD-10-CM | POA: Insufficient documentation

## 2014-12-28 LAB — HM COLONOSCOPY

## 2015-03-13 DIAGNOSIS — R4189 Other symptoms and signs involving cognitive functions and awareness: Secondary | ICD-10-CM | POA: Insufficient documentation

## 2015-03-13 DIAGNOSIS — R29818 Other symptoms and signs involving the nervous system: Secondary | ICD-10-CM | POA: Insufficient documentation

## 2015-03-13 DIAGNOSIS — E1140 Type 2 diabetes mellitus with diabetic neuropathy, unspecified: Secondary | ICD-10-CM | POA: Insufficient documentation

## 2015-03-13 DIAGNOSIS — E1142 Type 2 diabetes mellitus with diabetic polyneuropathy: Secondary | ICD-10-CM | POA: Insufficient documentation

## 2015-03-13 DIAGNOSIS — F0150 Vascular dementia without behavioral disturbance: Secondary | ICD-10-CM | POA: Insufficient documentation

## 2015-03-19 DIAGNOSIS — H9190 Unspecified hearing loss, unspecified ear: Secondary | ICD-10-CM | POA: Insufficient documentation

## 2015-03-19 DIAGNOSIS — IMO0001 Reserved for inherently not codable concepts without codable children: Secondary | ICD-10-CM | POA: Insufficient documentation

## 2015-03-19 DIAGNOSIS — H9193 Unspecified hearing loss, bilateral: Secondary | ICD-10-CM | POA: Insufficient documentation

## 2015-04-15 ENCOUNTER — Ambulatory Visit
Admission: RE | Admit: 2015-04-15 | Discharge: 2015-04-15 | Disposition: A | Discharge status: Home / Self Care | Payer: Medicare Other | Source: Ambulatory Visit | Attending: Cardiology | Admitting: Cardiology

## 2015-04-15 ENCOUNTER — Ambulatory Visit
Admission: RE | Admit: 2015-04-15 | Discharge: 2015-04-15 | Disposition: A | Discharge status: Home / Self Care | Payer: Medicare Other | Source: Ambulatory Visit | Attending: Internal Medicine | Admitting: Internal Medicine

## 2015-04-15 ENCOUNTER — Other Ambulatory Visit: Payer: Self-pay | Admitting: Internal Medicine

## 2015-04-15 DIAGNOSIS — R1084 Generalized abdominal pain: Secondary | ICD-10-CM | POA: Insufficient documentation

## 2015-06-04 DIAGNOSIS — K5909 Other constipation: Secondary | ICD-10-CM | POA: Diagnosis not present

## 2015-06-04 DIAGNOSIS — R109 Unspecified abdominal pain: Secondary | ICD-10-CM | POA: Diagnosis not present

## 2015-06-11 DIAGNOSIS — H903 Sensorineural hearing loss, bilateral: Secondary | ICD-10-CM | POA: Diagnosis not present

## 2015-06-14 DIAGNOSIS — R05 Cough: Secondary | ICD-10-CM | POA: Diagnosis not present

## 2015-06-14 DIAGNOSIS — E1151 Type 2 diabetes mellitus with diabetic peripheral angiopathy without gangrene: Secondary | ICD-10-CM | POA: Diagnosis not present

## 2015-06-14 DIAGNOSIS — E119 Type 2 diabetes mellitus without complications: Secondary | ICD-10-CM | POA: Diagnosis not present

## 2015-06-14 DIAGNOSIS — E8881 Metabolic syndrome: Secondary | ICD-10-CM | POA: Diagnosis not present

## 2015-07-14 DIAGNOSIS — E039 Hypothyroidism, unspecified: Secondary | ICD-10-CM | POA: Insufficient documentation

## 2015-07-15 DIAGNOSIS — E1122 Type 2 diabetes mellitus with diabetic chronic kidney disease: Secondary | ICD-10-CM | POA: Diagnosis not present

## 2015-07-15 DIAGNOSIS — E039 Hypothyroidism, unspecified: Secondary | ICD-10-CM | POA: Diagnosis not present

## 2015-07-15 DIAGNOSIS — E1165 Type 2 diabetes mellitus with hyperglycemia: Secondary | ICD-10-CM | POA: Diagnosis not present

## 2015-07-15 DIAGNOSIS — E782 Mixed hyperlipidemia: Secondary | ICD-10-CM | POA: Insufficient documentation

## 2015-07-15 DIAGNOSIS — E1142 Type 2 diabetes mellitus with diabetic polyneuropathy: Secondary | ICD-10-CM | POA: Diagnosis not present

## 2015-07-15 DIAGNOSIS — Z794 Long term (current) use of insulin: Secondary | ICD-10-CM | POA: Diagnosis not present

## 2015-07-15 DIAGNOSIS — I129 Hypertensive chronic kidney disease with stage 1 through stage 4 chronic kidney disease, or unspecified chronic kidney disease: Secondary | ICD-10-CM | POA: Diagnosis not present

## 2015-07-15 DIAGNOSIS — I10 Essential (primary) hypertension: Secondary | ICD-10-CM | POA: Diagnosis not present

## 2015-07-15 DIAGNOSIS — N183 Chronic kidney disease, stage 3 (moderate): Secondary | ICD-10-CM | POA: Diagnosis not present

## 2015-08-27 DIAGNOSIS — G301 Alzheimer's disease with late onset: Secondary | ICD-10-CM | POA: Diagnosis not present

## 2015-08-27 DIAGNOSIS — R69 Illness, unspecified: Secondary | ICD-10-CM | POA: Diagnosis not present

## 2016-01-02 DIAGNOSIS — M159 Polyosteoarthritis, unspecified: Secondary | ICD-10-CM | POA: Diagnosis not present

## 2016-01-02 DIAGNOSIS — E8881 Metabolic syndrome: Secondary | ICD-10-CM | POA: Diagnosis not present

## 2016-01-02 DIAGNOSIS — E669 Obesity, unspecified: Secondary | ICD-10-CM | POA: Diagnosis not present

## 2016-01-02 DIAGNOSIS — E1140 Type 2 diabetes mellitus with diabetic neuropathy, unspecified: Secondary | ICD-10-CM | POA: Diagnosis not present

## 2016-01-17 DIAGNOSIS — G301 Alzheimer's disease with late onset: Secondary | ICD-10-CM | POA: Diagnosis not present

## 2016-01-17 DIAGNOSIS — R69 Illness, unspecified: Secondary | ICD-10-CM | POA: Diagnosis not present

## 2016-02-17 DIAGNOSIS — N2581 Secondary hyperparathyroidism of renal origin: Secondary | ICD-10-CM | POA: Diagnosis not present

## 2016-02-17 DIAGNOSIS — N189 Chronic kidney disease, unspecified: Secondary | ICD-10-CM | POA: Diagnosis not present

## 2016-02-24 DIAGNOSIS — R69 Illness, unspecified: Secondary | ICD-10-CM | POA: Diagnosis not present

## 2016-02-24 DIAGNOSIS — I129 Hypertensive chronic kidney disease with stage 1 through stage 4 chronic kidney disease, or unspecified chronic kidney disease: Secondary | ICD-10-CM | POA: Diagnosis not present

## 2016-02-24 DIAGNOSIS — D631 Anemia in chronic kidney disease: Secondary | ICD-10-CM | POA: Diagnosis not present

## 2016-02-24 DIAGNOSIS — E1140 Type 2 diabetes mellitus with diabetic neuropathy, unspecified: Secondary | ICD-10-CM | POA: Diagnosis not present

## 2016-02-24 DIAGNOSIS — N184 Chronic kidney disease, stage 4 (severe): Secondary | ICD-10-CM | POA: Diagnosis not present

## 2016-02-24 DIAGNOSIS — N2581 Secondary hyperparathyroidism of renal origin: Secondary | ICD-10-CM | POA: Diagnosis not present

## 2016-02-27 DIAGNOSIS — E1122 Type 2 diabetes mellitus with diabetic chronic kidney disease: Secondary | ICD-10-CM | POA: Diagnosis not present

## 2016-02-27 DIAGNOSIS — E1142 Type 2 diabetes mellitus with diabetic polyneuropathy: Secondary | ICD-10-CM | POA: Diagnosis not present

## 2016-02-27 DIAGNOSIS — Z794 Long term (current) use of insulin: Secondary | ICD-10-CM | POA: Diagnosis not present

## 2016-02-27 DIAGNOSIS — N183 Chronic kidney disease, stage 3 (moderate): Secondary | ICD-10-CM | POA: Diagnosis not present

## 2016-02-27 DIAGNOSIS — I129 Hypertensive chronic kidney disease with stage 1 through stage 4 chronic kidney disease, or unspecified chronic kidney disease: Secondary | ICD-10-CM | POA: Diagnosis not present

## 2016-02-27 DIAGNOSIS — E1165 Type 2 diabetes mellitus with hyperglycemia: Secondary | ICD-10-CM | POA: Diagnosis not present

## 2016-02-27 DIAGNOSIS — E039 Hypothyroidism, unspecified: Secondary | ICD-10-CM | POA: Diagnosis not present

## 2016-02-27 DIAGNOSIS — E782 Mixed hyperlipidemia: Secondary | ICD-10-CM | POA: Diagnosis not present

## 2016-03-16 ENCOUNTER — Encounter (INDEPENDENT_AMBULATORY_CARE_PROVIDER_SITE_OTHER): Payer: Self-pay | Admitting: Vascular Surgery

## 2016-03-16 ENCOUNTER — Ambulatory Visit (INDEPENDENT_AMBULATORY_CARE_PROVIDER_SITE_OTHER): Payer: Medicare HMO | Admitting: Vascular Surgery

## 2016-03-16 DIAGNOSIS — M79604 Pain in right leg: Secondary | ICD-10-CM

## 2016-03-16 DIAGNOSIS — E782 Mixed hyperlipidemia: Secondary | ICD-10-CM | POA: Diagnosis not present

## 2016-03-16 DIAGNOSIS — I739 Peripheral vascular disease, unspecified: Secondary | ICD-10-CM | POA: Diagnosis not present

## 2016-03-16 DIAGNOSIS — R519 Headache, unspecified: Secondary | ICD-10-CM | POA: Insufficient documentation

## 2016-03-16 DIAGNOSIS — E0849 Diabetes mellitus due to underlying condition with other diabetic neurological complication: Secondary | ICD-10-CM

## 2016-03-16 DIAGNOSIS — N184 Chronic kidney disease, stage 4 (severe): Secondary | ICD-10-CM | POA: Diagnosis not present

## 2016-03-16 DIAGNOSIS — M79609 Pain in unspecified limb: Secondary | ICD-10-CM | POA: Insufficient documentation

## 2016-03-16 DIAGNOSIS — M79605 Pain in left leg: Secondary | ICD-10-CM | POA: Diagnosis not present

## 2016-03-16 DIAGNOSIS — E119 Type 2 diabetes mellitus without complications: Secondary | ICD-10-CM

## 2016-03-16 DIAGNOSIS — E1169 Type 2 diabetes mellitus with other specified complication: Secondary | ICD-10-CM | POA: Diagnosis not present

## 2016-03-16 DIAGNOSIS — N185 Chronic kidney disease, stage 5: Secondary | ICD-10-CM | POA: Insufficient documentation

## 2016-03-16 DIAGNOSIS — E785 Hyperlipidemia, unspecified: Secondary | ICD-10-CM

## 2016-03-16 DIAGNOSIS — I10 Essential (primary) hypertension: Secondary | ICD-10-CM | POA: Diagnosis not present

## 2016-03-16 DIAGNOSIS — E0840 Diabetes mellitus due to underlying condition with diabetic neuropathy, unspecified: Secondary | ICD-10-CM | POA: Insufficient documentation

## 2016-03-16 NOTE — Progress Notes (Deleted)
[facility] Admission History & Physical  MRN : [id number]  Joseph Mcpherson is a 70 y.o. (1946-02-24) male who presents with chief complaint of  Chief Complaint  Patient presents with  . New Evaluation    Burning sensation in both feet  .  History of Present Illness: ***  Current Meds  Medication Sig  . allopurinol (ZYLOPRIM) 100 MG tablet once.  Marland Kitchen aspirin EC 81 MG tablet Take by mouth.  . Cholecalciferol (VITAMIN D3) 5000 units TABS Take by mouth.  . diltiazem (CARDIZEM SR) 60 MG 12 hr capsule TAKE 1 CAPSULE(60 MG) BY MOUTH DAILY  . docusate sodium (COLACE) 100 MG capsule Take by mouth.  . donepezil (ARICEPT) 10 MG tablet Take by mouth.  . hydrALAZINE (APRESOLINE) 100 MG tablet   . insulin aspart protamine- aspart (NOVOLOG MIX 70/30) (70-30) 100 UNIT/ML injection Inject into the skin.  Marland Kitchen insulin detemir (LEVEMIR) 100 UNIT/ML injection Inject into the skin at bedtime.  Marland Kitchen levothyroxine (SYNTHROID, LEVOTHROID) 112 MCG tablet TAKE 1 TABLET(112 MCG) BY MOUTH DAILY  . metoprolol succinate (TOPROL-XL) 50 MG 24 hr tablet   . omeprazole (PRILOSEC) 20 MG capsule Take by mouth.  . simvastatin (ZOCOR) 20 MG tablet Take by mouth.  . sitaGLIPtin (JANUVIA) 100 MG tablet Take by mouth.    Past Medical History:  Diagnosis Date  . Diabetes mellitus without complication ([place])   . Hypertension     No past surgical history on file.  Social History Social History  Substance Use Topics  . Smoking status: Never Smoker  . Smokeless tobacco: Never Used  . Alcohol use No    Family History Family History  Problem Relation Age of Onset  . Diabetes Mother   . Hyperlipidemia Mother   . Hypertension Father   ***  No Known Allergies   REVIEW OF SYSTEMS (Negative unless checked)  Constitutional: [] Weight loss  [] Fever  [] Chills Cardiac: [] Chest pain   [] Chest pressure   [] Palpitations   [] Shortness of breath when laying flat   [] Shortness of breath with  exertion. Vascular:  [] Pain in legs with walking   [] Pain in legs at rest  [] History of DVT   [] Phlebitis   [] Swelling in legs   [] Varicose veins   [] Non-healing ulcers Pulmonary:   [] Uses home oxygen   [] Productive cough   [] Hemoptysis   [] Wheeze  [] COPD   [] Asthma Neurologic:  [] Dizziness   [] Seizures   [] History of stroke   [] History of TIA  [] Aphasia   [] Vissual changes   [] Weakness or numbness in arm   [] Weakness or numbness in leg Musculoskeletal:   [] Joint swelling   [] Joint pain   [] Low back pain Hematologic:  [] Easy bruising  [] Easy bleeding   [] Hypercoagulable state   [] Anemic Gastrointestinal:  [] Diarrhea   [] Vomiting  [] Gastroesophageal reflux/heartburn   [] Difficulty swallowing. Genitourinary:  [] Chronic kidney disease   [] Difficult urination  [] Frequent urination   [] Blood in urine Skin:  [] Rashes   [] Ulcers  Psychological:  [] History of anxiety   []  History of major depression.  Physical Examination  Vitals:   03/16/16 0935  BP: (!) 167/69  Pulse: 66  Resp: 17  Weight: 151 lb (68.5 kg)  Height: 5' 3.5" (1.613 m)   Body mass index is 26.33 kg/m. Gen: WD/WN, NAD Head: [place]/AT, No temporalis wasting.  Ear/Nose/Throat: Hearing grossly intact, nares w/o erythema or drainage, poor dentition Eyes: PER, EOMI, sclera nonicteric.  Neck: Supple, no masses.  No bruit or JVD.  Pulmonary:  Good air movement, clear to auscultation bilaterally, no use of accessory muscles.  Cardiac: RRR, normal S1, S2, no Murmurs. Vascular: *** Vessel Right Left  Radial Palpable Palpable  Ulnar Palpable Palpable  Brachial Palpable Palpable  Carotid Palpable Palpable  Femoral Palpable Palpable  Popliteal Palpable Palpable  PT Palpable Palpable  DP Palpable Palpable   Gastrointestinal: soft, non-distended. No guarding/no peritoneal signs.  Musculoskeletal: M/S 5/5 throughout.  No deformity or atrophy.  Neurologic: CN 2-12 intact. Pain and light touch intact in extremities.  Symmetrical.  Speech  is fluent. Motor exam as listed above. Psychiatric: Judgment intact, Mood & affect appropriate for pt's clinical situation. Dermatologic: No rashes or ulcers noted.  No changes consistent with cellulitis. Lymph : No Cervical lymphadenopathy, no lichenification or skin changes of chronic lymphedema.  CBC Lab Results  Component Value Date   WBC 9.4 09/12/2013   HGB 12.0 (L) 09/12/2013   HCT 36.5 (L) 09/12/2013   MCV 84 09/12/2013   PLT 241 09/12/2013    BMET    Component Value Date/Time   NA 139 09/14/2013 0534   K 4.3 09/14/2013 0534   CL 108 (H) 09/14/2013 0534   CO2 26 09/14/2013 0534   GLUCOSE 146 (H) 09/14/2013 0534   BUN 17 09/14/2013 0534   CREATININE 1.71 (H) 09/14/2013 0534   CALCIUM 8.7 09/14/2013 0534   GFRNONAA 41 (L) 09/14/2013 0534   GFRAA 47 (L) 09/14/2013 0534   CrCl cannot be calculated (Patient's most recent lab result is older than the maximum 21 days allowed.).  COAG No results found for: INR, PROTIME  Radiology No results found.  Outside Studies/Documentation *** pages of outside documents were reviewed.  They showed ***.  Assessment/Plan 1. Pain in both lower extremities ***  2. PAD (peripheral artery disease) (HCC) ***  3. Chronic renal insufficiency, stage 4 (severe) (HCC) ***  4. Essential hypertension ***  5. Type 2 diabetes mellitus with other specified complication, without long-term current use of insulin (HCC) ***  6. 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

## 2016-04-20 DIAGNOSIS — N2581 Secondary hyperparathyroidism of renal origin: Secondary | ICD-10-CM | POA: Diagnosis not present

## 2016-04-20 DIAGNOSIS — N184 Chronic kidney disease, stage 4 (severe): Secondary | ICD-10-CM | POA: Diagnosis not present

## 2016-04-23 DIAGNOSIS — E1121 Type 2 diabetes mellitus with diabetic nephropathy: Secondary | ICD-10-CM | POA: Diagnosis not present

## 2016-04-23 DIAGNOSIS — N184 Chronic kidney disease, stage 4 (severe): Secondary | ICD-10-CM | POA: Diagnosis not present

## 2016-04-23 DIAGNOSIS — H903 Sensorineural hearing loss, bilateral: Secondary | ICD-10-CM | POA: Diagnosis not present

## 2016-04-23 DIAGNOSIS — R69 Illness, unspecified: Secondary | ICD-10-CM | POA: Diagnosis not present

## 2016-04-23 DIAGNOSIS — I10 Essential (primary) hypertension: Secondary | ICD-10-CM | POA: Diagnosis not present

## 2016-04-23 DIAGNOSIS — Z7189 Other specified counseling: Secondary | ICD-10-CM | POA: Diagnosis not present

## 2016-04-23 DIAGNOSIS — G308 Other Alzheimer's disease: Secondary | ICD-10-CM | POA: Diagnosis not present

## 2016-04-23 DIAGNOSIS — R800 Isolated proteinuria: Secondary | ICD-10-CM | POA: Diagnosis not present

## 2016-04-23 DIAGNOSIS — Z01818 Encounter for other preprocedural examination: Secondary | ICD-10-CM | POA: Diagnosis not present

## 2016-04-27 DIAGNOSIS — I129 Hypertensive chronic kidney disease with stage 1 through stage 4 chronic kidney disease, or unspecified chronic kidney disease: Secondary | ICD-10-CM | POA: Diagnosis not present

## 2016-04-27 DIAGNOSIS — E1129 Type 2 diabetes mellitus with other diabetic kidney complication: Secondary | ICD-10-CM | POA: Diagnosis not present

## 2016-04-27 DIAGNOSIS — D631 Anemia in chronic kidney disease: Secondary | ICD-10-CM | POA: Diagnosis not present

## 2016-04-27 DIAGNOSIS — N2581 Secondary hyperparathyroidism of renal origin: Secondary | ICD-10-CM | POA: Diagnosis not present

## 2016-04-27 DIAGNOSIS — N184 Chronic kidney disease, stage 4 (severe): Secondary | ICD-10-CM | POA: Diagnosis not present

## 2016-04-30 ENCOUNTER — Ambulatory Visit (INDEPENDENT_AMBULATORY_CARE_PROVIDER_SITE_OTHER): Payer: Medicare Other | Admitting: Vascular Surgery

## 2016-04-30 ENCOUNTER — Encounter (INDEPENDENT_AMBULATORY_CARE_PROVIDER_SITE_OTHER): Payer: Medicare Other

## 2016-05-01 NOTE — Progress Notes (Signed)
MRN : [id number]  Joseph Mcpherson is a 70 y.o. ([birth year]) male who presents with chief complaint of  Chief Complaint  Patient presents with  . New Evaluation    Burning sensation in both feet  .  History of Present Illness: The patient is seen for evaluation of painful lower extremities. Patient notes the pain is variable and not always associated with activity.  The pain is somewhat consistent day to day occurring on most days. The patient notes the pain also occurs with standing and routinely seems worse as the day wears on. The pain has been progressive over the past several years. The patient states these symptoms are causing  a profound negative impact on quality of life and daily activities.  He also notes he has worsening renal dysfunction.  He denies uremic symptoms  The patient denies rest pain or dangling of an extremity off the side of the bed during the night for relief. No open wounds or sores at this time. No history of DVT or phlebitis. No prior interventions or surgeries.  There is a  history of back problems and DJD of the lumbar and sacral spine.    Current Meds  Medication Sig  . allopurinol (ZYLOPRIM) 100 MG tablet once.  Marland Kitchen aspirin EC 81 MG tablet Take by mouth.  . Cholecalciferol (VITAMIN D3) 5000 units TABS Take by mouth.  . diltiazem (CARDIZEM SR) 60 MG 12 hr capsule TAKE 1 CAPSULE(60 MG) BY MOUTH DAILY  . docusate sodium (COLACE) 100 MG capsule Take by mouth.  . donepezil (ARICEPT) 10 MG tablet Take by mouth.  . hydrALAZINE (APRESOLINE) 100 MG tablet   . insulin aspart protamine- aspart (NOVOLOG MIX 70/30) (70-30) 100 UNIT/ML injection Inject into the skin.  Marland Kitchen insulin detemir (LEVEMIR) 100 UNIT/ML injection Inject into the skin at bedtime.  Marland Kitchen levothyroxine (SYNTHROID, LEVOTHROID) 112 MCG tablet TAKE 1 TABLET(112 MCG) BY MOUTH DAILY  . metoprolol succinate (TOPROL-XL) 50 MG 24 hr tablet   . omeprazole (PRILOSEC) 20 MG capsule Take by mouth.  . simvastatin  (ZOCOR) 20 MG tablet Take by mouth.  . sitaGLIPtin (JANUVIA) 100 MG tablet Take by mouth.    Past Medical History:  Diagnosis Date  . Diabetes mellitus without complication ([place])   . Hypertension     No past surgical history on file.  Social History Social History  Substance Use Topics  . Smoking status: Never Smoker  . Smokeless tobacco: Never Used  . Alcohol use No    Family History Family History  Problem Relation Age of Onset  . Diabetes Mother   . Hyperlipidemia Mother   . Hypertension Father   No family history of bleeding/clotting disorders, porphyria or autoimmune disease   No Known Allergies   REVIEW OF SYSTEMS (Negative unless checked)  Constitutional: [] Weight loss  [] Fever  [] Chills Cardiac: [] Chest pain   [] Chest pressure   [] Palpitations   [] Shortness of breath when laying flat   [] Shortness of breath with exertion. Vascular:  [] Pain in legs with walking   [x] Pain in legs at rest  [] History of DVT   [] Phlebitis   [x] Swelling in legs   [] Varicose veins   [] Non-healing ulcers Pulmonary:   [] Uses home oxygen   [] Productive cough   [] Hemoptysis   [] Wheeze  [] COPD   [] Asthma Neurologic:  [] Dizziness   [] Seizures   [] History of stroke   [] History of TIA  [] Aphasia   [] Vissual changes   [] Weakness or numbness in arm   [x] Weakness or  numbness in leg Musculoskeletal:   [] Joint swelling   [] Joint pain   [] Low back pain Hematologic:  [] Easy bruising  [] Easy bleeding   [] Hypercoagulable state   [] Anemic Gastrointestinal:  [] Diarrhea   [] Vomiting  [] Gastroesophageal reflux/heartburn   [] Difficulty swallowing. Genitourinary:  [x] Chronic kidney disease   [] Difficult urination  [] Frequent urination   [] Blood in urine Skin:  [] Rashes   [] Ulcers  Psychological:  [] History of anxiety   []  History of major depression.  Physical Examination  Vitals:   03/16/16 0935  BP: (!) 167/69  Pulse: 66  Resp: 17  Weight: 151 lb (68.5 kg)  Height: 5' 3.5" (1.613 m)   Body mass  index is 26.33 kg/m. Gen: WD/WN, NAD Head: [place]/AT, No temporalis wasting.  Ear/Nose/Throat: Hearing grossly intact, nares w/o erythema or drainage, poor dentition Eyes: PER, EOMI, sclera nonicteric.  Neck: Supple, no masses.  No bruit or JVD.  Pulmonary:  Good air movement, clear to auscultation bilaterally, no use of accessory muscles.  Cardiac: RRR, normal S1, S2, no Murmurs. Vascular: 2+ pitting edema bilaterally with mild venous changes to the ankles Vessel Right Left  Radial Palpable Palpable  Ulnar Palpable Palpable  Brachial Palpable Palpable  Carotid Palpable Palpable  Femoral Palpable Palpable  Popliteal Not alpable Not Palpable  PT Not Palpable Not Palpable  DP Not Palpable Not Palpable   Gastrointestinal: soft, non-distended. No guarding/no peritoneal signs.  Musculoskeletal: M/S 5/5 throughout.  No deformity or atrophy.  Neurologic: CN 2-12 intact. Pain and light touch intact in extremities.  Symmetrical.  Speech is fluent. Motor exam as listed above. Psychiatric: Judgment intact, Mood & affect appropriate for pt's clinical situation. Dermatologic: No rashes or ulcers noted.  No changes consistent with cellulitis. Lymph : No Cervical lymphadenopathy, no lichenification or skin changes of chronic lymphedema.  CBC Lab Results  Component Value Date   WBC 9.4 09/12/2013   HGB 12.0 (L) 09/12/2013   HCT 36.5 (L) 09/12/2013   MCV 84 09/12/2013   PLT 241 09/12/2013    BMET    Component Value Date/Time   NA 139 09/14/2013 0534   K 4.3 09/14/2013 0534   CL 108 (H) 09/14/2013 0534   CO2 26 09/14/2013 0534   GLUCOSE 146 (H) 09/14/2013 0534   BUN 17 09/14/2013 0534   CREATININE 1.71 (H) 09/14/2013 0534   CALCIUM 8.7 09/14/2013 0534   GFRNONAA 41 (L) 09/14/2013 0534   GFRAA 47 (L) 09/14/2013 0534   CrCl cannot be calculated (Patient's most recent lab result is older than the maximum 21 days allowed.).  COAG No results found for: INR, PROTIME  Radiology No results  found.  Assessment/Plan 1. Pain in both lower extremities  Recommend:  The patient has atypical pain symptoms for pure atherosclerotic disease. However, on physical exam there is evidence of mixed venous and arterial disease, given the diminished pulses and the edema associated with venous changes of the legs.  Noninvasive studies including ABI's and arterial ultrasound of the legs will be obtained and the patient will follow up with me to review these studies.  The patient should continue walking and begin a more formal exercise program. The patient should continue his antiplatelet therapy and aggressive treatment of the lipid abnormalities.  The patient should begin wearing graduated compression socks 15-20 mmHg strength to control edema.   - VAS [place] LOWER EXTREMITY VENOUS (DVT); Future  2. PAD (peripheral artery disease) ([place]) See #1 - VAS [place] ABI WITH/WO TBI; Future  3. Chronic renal insufficiency, stage  4 (severe) (HCC) Given his renal disease vein mapping will be arranged and if a fistula is possible further discussion with the patient will be made - VAS [place] UPPER EXTREMITY VENOUS DUPLEX; Future  4. Essential hypertension Continue antihypertensive medications as already ordered and reviewed, no changes at this time.  5. Type 2 diabetes mellitus with other specified complication, without long-term current use of insulin (HCC) Continue Hypoglycemic medications as already ordered and reviewed, no changes at this time.  6. Mixed hyperlipidemia Continue statin as ordered and reviewed, no changes at this time  7. 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

## 2016-06-09 ENCOUNTER — Ambulatory Visit (INDEPENDENT_AMBULATORY_CARE_PROVIDER_SITE_OTHER): Payer: Medicare HMO | Admitting: Cardiovascular Disease

## 2016-06-09 ENCOUNTER — Encounter: Payer: Self-pay | Admitting: Cardiovascular Disease

## 2016-06-09 VITALS — BP 168/60 | HR 81 | Ht 64.0 in | Wt 147.2 lb

## 2016-06-09 DIAGNOSIS — E0849 Diabetes mellitus due to underlying condition with other diabetic neurological complication: Secondary | ICD-10-CM

## 2016-06-09 DIAGNOSIS — E1169 Type 2 diabetes mellitus with other specified complication: Secondary | ICD-10-CM | POA: Diagnosis not present

## 2016-06-09 DIAGNOSIS — E782 Mixed hyperlipidemia: Secondary | ICD-10-CM

## 2016-06-09 DIAGNOSIS — N184 Chronic kidney disease, stage 4 (severe): Secondary | ICD-10-CM

## 2016-06-09 DIAGNOSIS — R9431 Abnormal electrocardiogram [ECG] [EKG]: Secondary | ICD-10-CM

## 2016-06-09 DIAGNOSIS — I10 Essential (primary) hypertension: Secondary | ICD-10-CM

## 2016-06-09 DIAGNOSIS — R079 Chest pain, unspecified: Secondary | ICD-10-CM

## 2016-06-09 MED ORDER — AMLODIPINE BESYLATE 10 MG PO TABS: 10.0000 mg | ORAL_TABLET | Freq: Every day | ORAL | 11 refills | Status: DC

## 2016-06-09 NOTE — Patient Instructions (Addendum)
We will obtain the EKG from 08/2014 from [facility]   Medication Instructions:   Please restart amlodipine one a day for blood rpessure Please monitor blood pressure at home  Goal for Blood pressure <135 on top  Number for diabetes Bottom <90  Labwork:  No new labs needed  Testing/Procedures:  We will order a CT coronary calcium score for chest pain There is a one-time fee of $150 due at the time of your procedure [address], [address] [place], [place]  I recommend watching educational videos on topics of interest to you at:       [email or web address]  Enter code: HEARTCARE    Follow-Up: It was a pleasure seeing you in the office today. Please call us if you have new issues that need to be addressed before your next appt.  [phone]  Your physician wants you to follow-up in: as needed  If you need a refill on your cardiac medications before your next appointment, please call your pharmacy.    Coronary Calcium Scan A coronary calcium scan is an imaging test used to look for deposits of calcium and other fatty materials (plaques) in the inner lining of the blood vessels of your heart (coronary arteries). These deposits of calcium and plaques can partly clog and narrow the coronary arteries without producing any symptoms or warning signs. This puts you at risk for a heart attack. This test can detect these deposits before symptoms develop.  LET [facility] CARE PROVIDER KNOW ABOUT:  Any allergies you have.  All medicines you are taking, including vitamins, herbs, eye drops, creams, and over-the-counter medicines.  Previous problems you or members of your family have had with the use of anesthetics.  Any blood disorders you have.  Previous surgeries you have had.  Medical conditions you have.  Possibility of pregnancy, if this applies. RISKS AND COMPLICATIONS Generally, this is a safe procedure. However, as with any procedure, complications can occur. This test  involves the use of radiation. Radiation exposure can be dangerous to a pregnant woman and her unborn baby. If you are pregnant, you should not have this procedure done.  BEFORE THE PROCEDURE There is no special preparation for the procedure. PROCEDURE  You will need to undress and put on a hospital gown. You will need to remove any jewelry around your neck or chest.  Sticky electrodes are placed on your chest and are connected to an electrocardiogram (EKG or electrocardiography) machine to recorda tracing of the electrical activity of your heart.  A CT scanner will take pictures of your heart. During this time, you will be asked to lie still and hold your breath for 2-3 seconds while a picture is being taken of your heart. AFTER THE PROCEDURE   You will be allowed to get dressed.  You can return to your normal activities after the scan is done. This information is not intended to replace advice given to you by your health care provider. Make sure you discuss any questions you have with your health care provider. Document Released: 11/07/2007 Document Revised: 05/16/2013 Document Reviewed: 01/16/2013 Elsevier Interactive Patient Education  2017 [employer].

## 2016-06-09 NOTE — Progress Notes (Addendum)
Cardiology Office Note  Date:  06/09/2016   ID:  Joseph Mcpherson, DOB 02-05-1946, MRN [id number]  PCP:  Joseph Mcpherson   Chief Complaint  Patient presents with  . OTHER    C/o chest pain, sob and burning of feet at night. Meds reviewed verbally with pt.    HPI:  Joseph Mcpherson is a pleasant 71 year old gentleman who presents by referral/consultation from Joseph Mcpherson for evaluation of chest pain. Primary care physician is Joseph Mcpherson Notes indicating history of chronic renal failure, DVT and PE, long history of poor control diabetes type 2, essential hypertension, dementia, neuropathy  For the past 3 months he reports having pain under his left lower ribs, upper epigastric area. Pain is present when he sleeps at night, also when he is sitting at rest. Sometimes presents when he is moving. Some reproducible component when he pushes under the ribs on the left, sometimes makes it feel better.  Pain is 5 out of 10, constant at times. The last short or long periods of time. Denies any change in his bowel movements, denies constipation or diarrhea. Denies any pain on deep inspiration or when he moves his upper body. Denies any association with heavy exertion.  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   EKG on today's visit shows normal sinus rhythm with rate 81 bpm, ST and T wave abnormality V5, V6, 2, 3, aVF. Previous EKG from [facility] 2016 has been requested. No ST or T-wave abnormality noted on the report from [facility] indicating possible new abnormality in the past 2 years  PMH:   has a past medical history  of Clotting disorder ([place]); Diabetes mellitus without complication ([place]); Hyperlipidemia; Hypertension; and Thyroid disease.  PSH:   History reviewed. No pertinent surgical history.  Current Outpatient Prescriptions  Medication Sig Dispense Refill  . allopurinol (ZYLOPRIM) 100 MG tablet Take 100 mg by mouth daily.     Marland Kitchen aspirin EC 81 MG tablet Take by mouth.    . Cholecalciferol (VITAMIN D3) 5000 units TABS Take by mouth.    . docusate sodium (COLACE) 100 MG capsule Take 100 mg by mouth daily.     Marland Kitchen donepezil (ARICEPT) 10 MG tablet Take 10 mg by mouth at bedtime.     . furosemide (LASIX) 20 MG tablet Take 20 mg by mouth daily.    Marland Kitchen gabapentin (NEURONTIN) 100 MG capsule Take 100 mg by mouth at bedtime.    . hydrALAZINE (APRESOLINE) 100 MG tablet Take 100 mg by mouth 3 (three) times daily.     . insulin aspart protamine- aspart (NOVOLOG MIX 70/30) (70-30) 100 UNIT/ML injection Inject into the skin.    Marland Kitchen insulin detemir (LEVEMIR) 100 UNIT/ML injection Inject into the skin at bedtime.    Marland Kitchen levothyroxine (SYNTHROID, LEVOTHROID) 112 MCG tablet TAKE 1 TABLET(112 MCG) BY MOUTH DAILY    . metoprolol succinate (TOPROL-XL) 50 MG 24 hr tablet Take 50 mg by mouth daily.     Marland Kitchen omeprazole (PRILOSEC) 20 MG capsule Take 20 mg by mouth every 12 (twelve) hours.     . simvastatin (ZOCOR) 20 MG tablet Take 20 mg by mouth daily.     . sitaGLIPtin (  JANUVIA) 100 MG tablet Take 100 mg by mouth daily.      No current facility-administered medications for this visit.      Allergies:   Patient has no known allergies.   Social History:  The patient  reports that he has never smoked. He has never used smokeless tobacco. He reports that he does not drink alcohol or use drugs.   Family History:   family history includes Diabetes in his mother; Hyperlipidemia in his mother; Hypertension in his father.    Review of Systems: Review of Systems  Constitutional: Negative.   Respiratory: Negative.   Cardiovascular:  Positive for chest pain.       Pain under left side lower ribs  Gastrointestinal: Negative.   Musculoskeletal: Negative.   Neurological: Negative.   Psychiatric/Behavioral: Negative.   All other systems reviewed and are negative.    PHYSICAL EXAM: VS:  BP (!) 168/60 (BP Location: Right Arm, Patient Position: Sitting, Cuff Size: Normal)   Pulse 81   Ht 5\' 4"  (1.626 m)   Wt 147 lb 4 oz (66.8 kg)   BMI 25.28 kg/m  , BMI Body mass index is 25.28 kg/m. GEN: Well nourished, well developed, in no acute distress  HEENT: normal  Neck: no JVD, carotid bruits, or masses Cardiac: RRR; no murmurs, rubs, or gallops,no edema  Respiratory:  clear to auscultation bilaterally, normal work of breathing GI: soft, nontender, nondistended, + BS MS: no deformity or atrophy  Skin: warm and dry, no rash Neuro:  Strength and sensation are intact Psych: euthymic mood, full affect    Recent Labs: No results found for requested labs within last 8760 hours.    Lipid Panel No results found for: CHOL, HDL, LDLCALC, TRIG    Wt Readings from Last 3 Encounters:  06/09/16 147 lb 4 oz (66.8 kg)  03/16/16 151 lb (68.5 kg)       ASSESSMENT AND PLAN:  Chest pain, unspecified type - Plan: EKG 12-Lead Somewhat atypical pain left side lower ribs, upper epigastric area on the left. Not associated with exertion, pain going over bumps in the car is atypical. He does have abnormal EKG, with new change compared to 2 years ago He does have risk factors for coronary disease including long history of diabetes, hyperlipidemia, after long discussion with patient and family in the room, CT coronary calcium score has been ordered for risk medication, also to potentially image the area in question.  If no clear etiology determined, potentially could order left upper quadrant ultrasound  Essential hypertension Review of previous notes from endocrinology indicate he was taking diltiazem and amlodipine. He does not have  either of these medications with him today, only metoprolol and hydralazine. Most notes reviewed show systolic pressure typically greater than 160. Recommended he start amlodipine 10 mg daily, close to monitor blood pressure at home. Family will help him as he has trouble putting the blood pressure cuff in place  Mixed hyperlipidemia Encouraged him to stay on his simvastatin  Type 2 diabetes mellitus with other specified complication, without long-term current use of insulin ([place]) Stressed importance of aggressive diabetes control. 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 He has neuropathy, takes low-dose gabapentin. Recommended he talk with primary care  about increasing his gabapentin if symptoms get worse  Abnormal EKG Findings concerning for inferolateral ischemia He is asymptomatic apart from atypical left lower rib pain Plan as above with CT coronary calcium scoring, stress test if calcium scores elevated  Alzheimer's Followed by neurology Patient was conversant, appropriate on today's visit    Total encounter time more than 60 minutes  Greater than 50% was spent in counseling and coordination of care with the patient  Disposition:   F/U  6 months   Orders Placed This Encounter  Procedures  . EKG 12-Lead     Signed, Esmond Plants, M.D., Ph.D. 06/09/2016  [place], [place]

## 2016-06-11 ENCOUNTER — Inpatient Hospital Stay: Admission: RE | Admit: 2016-06-11 | Payer: Medicare HMO | Source: Ambulatory Visit

## 2016-06-17 ENCOUNTER — Ambulatory Visit (INDEPENDENT_AMBULATORY_CARE_PROVIDER_SITE_OTHER)
Admission: RE | Admit: 2016-06-17 | Discharge: 2016-06-17 | Disposition: A | Discharge status: Home / Self Care | Payer: Self-pay | Source: Ambulatory Visit | Attending: Cardiovascular Disease | Admitting: Cardiovascular Disease

## 2016-06-17 DIAGNOSIS — E782 Mixed hyperlipidemia: Secondary | ICD-10-CM

## 2016-06-17 DIAGNOSIS — R079 Chest pain, unspecified: Secondary | ICD-10-CM

## 2016-06-17 DIAGNOSIS — I10 Essential (primary) hypertension: Secondary | ICD-10-CM

## 2016-06-17 DIAGNOSIS — E1169 Type 2 diabetes mellitus with other specified complication: Secondary | ICD-10-CM

## 2016-06-17 DIAGNOSIS — E0849 Diabetes mellitus due to underlying condition with other diabetic neurological complication: Secondary | ICD-10-CM

## 2016-06-17 DIAGNOSIS — R9431 Abnormal electrocardiogram [ECG] [EKG]: Secondary | ICD-10-CM

## 2016-06-17 DIAGNOSIS — N184 Chronic kidney disease, stage 4 (severe): Secondary | ICD-10-CM

## 2016-06-19 ENCOUNTER — Other Ambulatory Visit: Payer: Self-pay

## 2016-06-19 DIAGNOSIS — R931 Abnormal findings on diagnostic imaging of heart and coronary circulation: Secondary | ICD-10-CM

## 2016-06-19 NOTE — Progress Notes (Signed)
METO

## 2016-07-02 ENCOUNTER — Encounter
Admission: RE | Admit: 2016-07-02 | Discharge: 2016-07-02 | Disposition: A | Discharge status: Home / Self Care | Payer: Medicare HMO | Source: Ambulatory Visit | Attending: Cardiovascular Disease | Admitting: Cardiovascular Disease

## 2016-07-02 DIAGNOSIS — R931 Abnormal findings on diagnostic imaging of heart and coronary circulation: Secondary | ICD-10-CM | POA: Insufficient documentation

## 2016-07-02 LAB — NM MYOCAR MULTI W/SPECT W/WALL MOTION / EF
CHL CUP MPHR: 150 {beats}/min
CHL CUP NUCLEAR SDS: 0
CHL CUP NUCLEAR SRS: 0
CHL CUP NUCLEAR SSS: 1
CHL CUP STRESS STAGE 1 GRADE: 0 %
CHL CUP STRESS STAGE 1 HR: 75 {beats}/min
CHL CUP STRESS STAGE 3 GRADE: 0 %
CHL CUP STRESS STAGE 3 SPEED: 0 mph
CHL CUP STRESS STAGE 4 HR: 89 {beats}/min
CSEPED: 0 min
CSEPEDS: 0 s
CSEPHR: 67 %
CSEPPMHR: 63 %
Estimated workload: 1 METS
LV dias vol: 74 mL (ref 62–150)
LVSYSVOL: 34 mL
Peak HR: 95 {beats}/min
Rest HR: 72 {beats}/min
Stage 1 Speed: 0 mph
Stage 2 Grade: 0 %
Stage 2 HR: 75 {beats}/min
Stage 2 Speed: 0 mph
Stage 3 HR: 95 {beats}/min
Stage 4 DBP: 61 mmHg
Stage 4 Grade: 0 %
Stage 4 SBP: 154 mmHg
Stage 4 Speed: 0 mph
TID: 1

## 2016-07-02 MED ORDER — REGADENOSON 0.4 MG/5ML IV SOLN
0.4000 mg | Freq: Once | INTRAVENOUS | Status: AC
  Administered 2016-07-02: 0.4 mg via INTRAVENOUS

## 2016-07-02 MED ORDER — TECHNETIUM TC 99M TETROFOSMIN IV KIT
32.9400 | PACK | Freq: Once | INTRAVENOUS | Status: AC | PRN
  Administered 2016-07-02: 32.94 via INTRAVENOUS

## 2016-07-02 MED ORDER — TECHNETIUM TC 99M TETROFOSMIN IV KIT
12.0000 | PACK | Freq: Once | INTRAVENOUS | Status: AC | PRN
  Administered 2016-07-02: 12.55 via INTRAVENOUS

## 2016-07-03 ENCOUNTER — Telehealth: Payer: Self-pay | Admitting: Cardiovascular Disease

## 2016-07-03 NOTE — Telephone Encounter (Signed)
Patient daughter would like the results from yesterdays stress test.  Please call.

## 2016-07-03 NOTE — Telephone Encounter (Signed)
Test has not been resulted yet. Awaiting MD review and recommendations.

## 2016-07-06 ENCOUNTER — Encounter: Payer: Self-pay | Admitting: Cardiovascular Disease

## 2016-07-24 DIAGNOSIS — E1365 Other specified diabetes mellitus with hyperglycemia: Secondary | ICD-10-CM | POA: Diagnosis not present

## 2016-07-24 DIAGNOSIS — H35049 Retinal micro-aneurysms, unspecified, unspecified eye: Secondary | ICD-10-CM | POA: Diagnosis not present

## 2016-07-24 DIAGNOSIS — E11319 Type 2 diabetes mellitus with unspecified diabetic retinopathy without macular edema: Secondary | ICD-10-CM | POA: Diagnosis not present

## 2016-07-24 DIAGNOSIS — E1140 Type 2 diabetes mellitus with diabetic neuropathy, unspecified: Secondary | ICD-10-CM | POA: Diagnosis not present

## 2016-07-28 DIAGNOSIS — N2581 Secondary hyperparathyroidism of renal origin: Secondary | ICD-10-CM | POA: Diagnosis not present

## 2016-07-28 DIAGNOSIS — D631 Anemia in chronic kidney disease: Secondary | ICD-10-CM | POA: Diagnosis not present

## 2016-07-28 DIAGNOSIS — E1129 Type 2 diabetes mellitus with other diabetic kidney complication: Secondary | ICD-10-CM | POA: Diagnosis not present

## 2016-07-28 DIAGNOSIS — N184 Chronic kidney disease, stage 4 (severe): Secondary | ICD-10-CM | POA: Diagnosis not present

## 2016-07-28 DIAGNOSIS — I129 Hypertensive chronic kidney disease with stage 1 through stage 4 chronic kidney disease, or unspecified chronic kidney disease: Secondary | ICD-10-CM | POA: Diagnosis not present

## 2016-07-28 DIAGNOSIS — M7541 Impingement syndrome of right shoulder: Secondary | ICD-10-CM | POA: Diagnosis not present

## 2016-07-28 DIAGNOSIS — N189 Chronic kidney disease, unspecified: Secondary | ICD-10-CM | POA: Diagnosis not present

## 2016-08-06 ENCOUNTER — Telehealth: Payer: Self-pay | Admitting: Cardiovascular Disease

## 2016-08-06 NOTE — Telephone Encounter (Signed)
Rec'd records forward 28 pages to Dr. Ida Rogue

## 2016-08-10 ENCOUNTER — Ambulatory Visit
Admission: RE | Admit: 2016-08-10 | Discharge: 2016-08-10 | Disposition: A | Discharge status: Home / Self Care | Payer: Medicare HMO | Source: Ambulatory Visit | Attending: Nephrology | Admitting: Nephrology

## 2016-08-10 DIAGNOSIS — D631 Anemia in chronic kidney disease: Secondary | ICD-10-CM | POA: Insufficient documentation

## 2016-08-10 DIAGNOSIS — N189 Chronic kidney disease, unspecified: Secondary | ICD-10-CM | POA: Diagnosis present

## 2016-08-10 MED ORDER — SODIUM CHLORIDE FLUSH 0.9 % IV SOLN
INTRAVENOUS | Status: AC
  Administered 2016-08-10: 10 mL
  Filled 2016-08-10: qty 10

## 2016-08-10 MED ORDER — SODIUM CHLORIDE 0.9 % IV SOLN
510.0000 mg | Freq: Once | INTRAVENOUS | Status: AC
  Administered 2016-08-10: 510 mg via INTRAVENOUS
  Filled 2016-08-10: qty 17

## 2016-08-17 ENCOUNTER — Ambulatory Visit
Admission: RE | Admit: 2016-08-17 | Discharge: 2016-08-17 | Disposition: A | Discharge status: Home / Self Care | Payer: Medicare HMO | Source: Ambulatory Visit | Attending: Nephrology | Admitting: Nephrology

## 2016-08-17 DIAGNOSIS — N189 Chronic kidney disease, unspecified: Secondary | ICD-10-CM | POA: Insufficient documentation

## 2016-08-17 DIAGNOSIS — D631 Anemia in chronic kidney disease: Secondary | ICD-10-CM | POA: Diagnosis not present

## 2016-08-17 MED ORDER — SODIUM CHLORIDE FLUSH 0.9 % IV SOLN
INTRAVENOUS | Status: AC
  Administered 2016-08-17: 15:00:00
  Filled 2016-08-17: qty 10

## 2016-08-17 MED ORDER — SODIUM CHLORIDE 0.9 % IV SOLN
510.0000 mg | Freq: Once | INTRAVENOUS | Status: AC
  Administered 2016-08-17: 510 mg via INTRAVENOUS
  Filled 2016-08-17: qty 17

## 2016-08-26 NOTE — Telephone Encounter (Signed)
error 

## 2016-10-21 DIAGNOSIS — N184 Chronic kidney disease, stage 4 (severe): Secondary | ICD-10-CM | POA: Diagnosis not present

## 2016-10-21 DIAGNOSIS — N2581 Secondary hyperparathyroidism of renal origin: Secondary | ICD-10-CM | POA: Diagnosis not present

## 2016-10-21 DIAGNOSIS — N189 Chronic kidney disease, unspecified: Secondary | ICD-10-CM | POA: Diagnosis not present

## 2016-10-23 DIAGNOSIS — E669 Obesity, unspecified: Secondary | ICD-10-CM | POA: Diagnosis not present

## 2016-10-23 DIAGNOSIS — E11319 Type 2 diabetes mellitus with unspecified diabetic retinopathy without macular edema: Secondary | ICD-10-CM | POA: Diagnosis not present

## 2016-10-23 DIAGNOSIS — H35049 Retinal micro-aneurysms, unspecified, unspecified eye: Secondary | ICD-10-CM | POA: Diagnosis not present

## 2016-10-23 DIAGNOSIS — M159 Polyosteoarthritis, unspecified: Secondary | ICD-10-CM | POA: Diagnosis not present

## 2016-10-27 DIAGNOSIS — E1129 Type 2 diabetes mellitus with other diabetic kidney complication: Secondary | ICD-10-CM | POA: Diagnosis not present

## 2016-10-27 DIAGNOSIS — I120 Hypertensive chronic kidney disease with stage 5 chronic kidney disease or end stage renal disease: Secondary | ICD-10-CM | POA: Diagnosis not present

## 2016-10-27 DIAGNOSIS — N185 Chronic kidney disease, stage 5: Secondary | ICD-10-CM | POA: Diagnosis not present

## 2016-10-27 DIAGNOSIS — N2581 Secondary hyperparathyroidism of renal origin: Secondary | ICD-10-CM | POA: Diagnosis not present

## 2016-10-27 DIAGNOSIS — D631 Anemia in chronic kidney disease: Secondary | ICD-10-CM | POA: Diagnosis not present

## 2016-10-29 ENCOUNTER — Encounter (INDEPENDENT_AMBULATORY_CARE_PROVIDER_SITE_OTHER): Payer: Self-pay | Admitting: Vascular Surgery

## 2016-10-29 ENCOUNTER — Ambulatory Visit (INDEPENDENT_AMBULATORY_CARE_PROVIDER_SITE_OTHER): Payer: Medicare HMO | Admitting: Vascular Surgery

## 2016-10-29 VITALS — BP 126/65 | HR 72 | Resp 16 | Wt 144.0 lb

## 2016-10-29 DIAGNOSIS — I10 Essential (primary) hypertension: Secondary | ICD-10-CM | POA: Diagnosis not present

## 2016-10-29 DIAGNOSIS — E782 Mixed hyperlipidemia: Secondary | ICD-10-CM

## 2016-10-29 DIAGNOSIS — I739 Peripheral vascular disease, unspecified: Secondary | ICD-10-CM | POA: Diagnosis not present

## 2016-10-29 DIAGNOSIS — N185 Chronic kidney disease, stage 5: Secondary | ICD-10-CM | POA: Diagnosis not present

## 2016-10-29 DIAGNOSIS — E1169 Type 2 diabetes mellitus with other specified complication: Secondary | ICD-10-CM | POA: Diagnosis not present

## 2016-10-29 NOTE — Progress Notes (Signed)
MRN : [id number]  Joseph Mcpherson is a 71 y.o. (20-Jul-1945) male who presents with chief complaint of  Chief Complaint  Patient presents with  . Follow-up  .  History of Present Illness: The patient is seen for evaluation for dialysis access. The patient has chronic renal insufficiency stage V secondary to hypertension and diabetes. The patient's most recent creatinine clearance is about 15. The patient volume status has not yet become an issue. Patient's blood pressures been relatively well controlled. There are mild uremic symptoms which appear to be relatively well tolerated at this time. The patient is right-handed.  The patient has been considering the various methods of dialysis and wishes to proceed with hemodialysis and therefore creation of AV access.  The patient denies amaurosis fugax or recent TIA symptoms. There are no recent neurological changes noted. The patient denies claudication symptoms or rest pain symptoms. The patient denies history of DVT, PE or superficial thrombophlebitis. The patient denies recent episodes of angina or shortness of breath.   Current Meds  Medication Sig  . allopurinol (ZYLOPRIM) 100 MG tablet Take 100 mg by mouth daily.   Marland Kitchen amLODipine (NORVASC) 10 MG tablet Take 1 tablet (10 mg total) by mouth daily.  Marland Kitchen aspirin EC 81 MG tablet Take by mouth.  . Cholecalciferol (VITAMIN D3) 5000 units TABS Take by mouth.  . colchicine 0.6 MG tablet Take 0.6 mg by mouth 2 (two) times daily.  Marland Kitchen docusate sodium (COLACE) 100 MG capsule Take 100 mg by mouth daily.   Marland Kitchen donepezil (ARICEPT) 10 MG tablet Take 10 mg by mouth at bedtime.   . furosemide (LASIX) 20 MG tablet Take 20 mg by mouth daily.  Marland Kitchen gabapentin (NEURONTIN) 100 MG capsule Take 100 mg by mouth at bedtime.  . hydrALAZINE (APRESOLINE) 100 MG tablet Take 100 mg by mouth 3 (three) times daily.   . insulin aspart protamine- aspart (NOVOLOG MIX 70/30) (70-30) 100 UNIT/ML injection Inject into the skin.  Marland Kitchen  insulin detemir (LEVEMIR) 100 UNIT/ML injection Inject into the skin at bedtime.  Marland Kitchen levothyroxine (SYNTHROID, LEVOTHROID) 112 MCG tablet TAKE 1 TABLET(112 MCG) BY MOUTH DAILY  . metoprolol succinate (TOPROL-XL) 50 MG 24 hr tablet Take 50 mg by mouth daily.   . Omega-3 Fatty Acids (FISH OIL) 1000 MG CAPS Take by mouth daily.  Marland Kitchen omeprazole (PRILOSEC) 20 MG capsule Take 20 mg by mouth every 12 (twelve) hours.   . sitaGLIPtin (JANUVIA) 100 MG tablet Take 100 mg by mouth daily.   . vitamin B-12 (CYANOCOBALAMIN) 1000 MCG tablet Take 1,000 mcg by mouth daily.    Past Medical History:  Diagnosis Date  . Clotting disorder ([place])   . Diabetes mellitus without complication ([place])   . Hyperlipidemia   . Hypertension   . Thyroid disease     No past surgical history on file.  Social History Social History  Substance Use Topics  . Smoking status: Never Smoker  . Smokeless tobacco: Never Used  . Alcohol use No    Family History Family History  Problem Relation Age of Onset  . Diabetes Mother   . Hyperlipidemia Mother   . Hypertension Father     No Known Allergies   REVIEW OF SYSTEMS (Negative unless checked)  Constitutional: [] Weight loss  [] Fever  [] Chills Cardiac: [] Chest pain   [] Chest pressure   [] Palpitations   [] Shortness of breath when laying flat   [] Shortness of breath with exertion. Vascular:  [x] Pain in legs with walking   [] Pain  in legs at rest  [] History of DVT   [] Phlebitis   [x] Swelling in legs   [] Varicose veins   [] Non-healing ulcers Pulmonary:   [] Uses home oxygen   [] Productive cough   [] Hemoptysis   [] Wheeze  [] COPD   [] Asthma Neurologic:  [] Dizziness   [] Seizures   [] History of stroke   [] History of TIA  [] Aphasia   [] Vissual changes   [] Weakness or numbness in arm   [] Weakness or numbness in leg Musculoskeletal:   [] Joint swelling   [] Joint pain   [] Low back pain Hematologic:  [] Easy bruising  [] Easy bleeding   [] Hypercoagulable state   [] Anemic Gastrointestinal:   [] Diarrhea   [] Vomiting  [] Gastroesophageal reflux/heartburn   [] Difficulty swallowing. Genitourinary:  [x] Chronic kidney disease   [] Difficult urination  [] Frequent urination   [] Blood in urine Skin:  [] Rashes   [] Ulcers  Psychological:  [] History of anxiety   []  History of major depression.  Physical Examination  Vitals:   10/29/16 0922  BP: 126/65  Pulse: 72  Resp: 16  Weight: 144 lb (65.3 kg)   Body mass index is 27.21 kg/m. Gen: WD/WN, NAD Head: [place]/AT, No temporalis wasting.  Ear/Nose/Throat: Hearing grossly intact, nares w/o erythema or drainage Eyes: PER, EOMI, sclera nonicteric.  Neck: Supple, no large masses.   Pulmonary:  Good air movement, no audible wheezing bilaterally, no use of accessory muscles.  Cardiac: RRR, no JVD Vascular: 1+ pitting edema bilaterally Vessel Right Left  Radial Palpable Palpable  Ulnar Palpable Palpable  Brachial Palpable Palpable  PT Not Palpable Not Palpable  DP Not Palpable Not Palpable  Gastrointestinal: Non-distended. No guarding/no peritoneal signs.  Musculoskeletal: M/S 5/5 throughout.  No deformity or atrophy.  Neurologic: CN 2-12 intact. Symmetrical.  Speech is fluent. Motor exam as listed above. Psychiatric: Judgment intact, Mood & affect appropriate for pt's clinical situation. Dermatologic: No rashes or ulcers noted.  No changes consistent with cellulitis. Lymph : No lichenification or skin changes of chronic lymphedema.  CBC Lab Results  Component Value Date   WBC 9.4 09/12/2013   HGB 12.0 (L) 09/12/2013   HCT 36.5 (L) 09/12/2013   MCV 84 09/12/2013   PLT 241 09/12/2013    BMET    Component Value Date/Time   NA 139 09/14/2013 0534   K 4.3 09/14/2013 0534   CL 108 (H) 09/14/2013 0534   CO2 26 09/14/2013 0534   GLUCOSE 146 (H) 09/14/2013 0534   BUN 17 09/14/2013 0534   CREATININE 1.71 (H) 09/14/2013 0534   CALCIUM 8.7 09/14/2013 0534   GFRNONAA 41 (L) 09/14/2013 0534   GFRAA 47 (L) 09/14/2013 0534   CrCl  cannot be calculated (Patient's most recent lab result is older than the maximum 21 days allowed.).  COAG No results found for: INR, PROTIME  Radiology No results found.  Assessment/Plan 1. Chronic renal insufficiency, stage 5 (HCC) Recommend:  At this time the patient does not have appropriate extremity access for dialysis  Patient should have vein mapping to see if a fistula is an option.  Per Dr Serita Grit note if he will require a graft then we will hold off for now  The risks, benefits and alternative therapies were reviewed in detail with the patient.  All questions were answered.  The patient agrees to proceed with surgery.   - VAS [place] UPPER EXTREMITY VEIN MAPPING; Future  2. PAD (peripheral artery disease) (HCC)  Recommend:  The patient has evidence of atherosclerosis of the lower extremities with claudication.  The patient does  not voice lifestyle limiting changes at this point in time.  Noninvasive studies do not suggest clinically significant change.  No invasive studies, angiography or surgery at this time The patient should continue walking and begin a more formal exercise program.  The patient should continue antiplatelet therapy and aggressive treatment of the lipid abnormalities  No changes in the patient's medications at this time  The patient should continue wearing graduated compression socks 10-15 mmHg strength to control the mild edema.    3. Essential hypertension Continue antihypertensive medications as already ordered, these medications have been reviewed and there are no changes at this time.    4. Type 2 diabetes mellitus with other specified complication, without long-term current use of insulin (HCC) Continue hypoglycemic medications as already ordered, these medications have been reviewed and there are no changes at this time.  Hgb A1C to be monitored as already arranged by primary service   5. Mixed hyperlipidemia Continue statin as ordered and  reviewed, no changes at this time     Hortencia Pilar, MD  10/29/2016 12:54 PM

## 2016-11-03 DIAGNOSIS — E1140 Type 2 diabetes mellitus with diabetic neuropathy, unspecified: Secondary | ICD-10-CM | POA: Diagnosis not present

## 2016-11-03 DIAGNOSIS — T17308A Unspecified foreign body in larynx causing other injury, initial encounter: Secondary | ICD-10-CM | POA: Diagnosis not present

## 2016-11-03 DIAGNOSIS — E119 Type 2 diabetes mellitus without complications: Secondary | ICD-10-CM | POA: Diagnosis not present

## 2016-11-04 DIAGNOSIS — R69 Illness, unspecified: Secondary | ICD-10-CM | POA: Diagnosis not present

## 2016-11-10 DIAGNOSIS — R69 Illness, unspecified: Secondary | ICD-10-CM | POA: Diagnosis not present

## 2016-11-10 DIAGNOSIS — M7541 Impingement syndrome of right shoulder: Secondary | ICD-10-CM | POA: Diagnosis not present

## 2016-11-10 DIAGNOSIS — M5412 Radiculopathy, cervical region: Secondary | ICD-10-CM | POA: Diagnosis not present

## 2016-12-07 DIAGNOSIS — M5412 Radiculopathy, cervical region: Secondary | ICD-10-CM | POA: Diagnosis not present

## 2016-12-07 DIAGNOSIS — M7541 Impingement syndrome of right shoulder: Secondary | ICD-10-CM | POA: Diagnosis not present

## 2016-12-10 ENCOUNTER — Ambulatory Visit (INDEPENDENT_AMBULATORY_CARE_PROVIDER_SITE_OTHER): Payer: Medicare HMO

## 2016-12-10 ENCOUNTER — Encounter (INDEPENDENT_AMBULATORY_CARE_PROVIDER_SITE_OTHER): Payer: Self-pay | Admitting: Vascular Surgery

## 2016-12-10 ENCOUNTER — Ambulatory Visit (INDEPENDENT_AMBULATORY_CARE_PROVIDER_SITE_OTHER): Payer: Medicare HMO | Admitting: Vascular Surgery

## 2016-12-10 VITALS — BP 134/64 | HR 70 | Resp 16 | Wt 145.0 lb

## 2016-12-10 DIAGNOSIS — N2889 Other specified disorders of kidney and ureter: Secondary | ICD-10-CM

## 2016-12-10 DIAGNOSIS — I739 Peripheral vascular disease, unspecified: Secondary | ICD-10-CM

## 2016-12-10 DIAGNOSIS — N185 Chronic kidney disease, stage 5: Secondary | ICD-10-CM

## 2016-12-10 DIAGNOSIS — E1169 Type 2 diabetes mellitus with other specified complication: Secondary | ICD-10-CM

## 2016-12-10 DIAGNOSIS — I10 Essential (primary) hypertension: Secondary | ICD-10-CM | POA: Diagnosis not present

## 2016-12-10 DIAGNOSIS — E782 Mixed hyperlipidemia: Secondary | ICD-10-CM

## 2016-12-13 NOTE — Progress Notes (Signed)
MRN : [id number]  Joseph Mcpherson is a 71 y.o. (December 21, 1945) male who presents with chief complaint of  Chief Complaint  Patient presents with  . ultrasound follow up  .  History of Present Illness: The patient is seen for evaluation for dialysis access. The patient has chronic renal insufficiency stage IV secondary to hypertension and diabetes. The patient's most recent creatinine clearance is less than 35. The patient volume status has not yet become an issue. Patient's blood pressures been relatively well controlled. There are no uremic symptoms which appear to be relatively well tolerated at this time. The patient is right-handed.  Venous mapping shows there is no vein adequate for a fistula  Current Meds  Medication Sig  . allopurinol (ZYLOPRIM) 100 MG tablet Take 100 mg by mouth daily.   Marland Kitchen amLODipine (NORVASC) 10 MG tablet Take 1 tablet (10 mg total) by mouth daily.  Marland Kitchen aspirin EC 81 MG tablet Take by mouth.  . Cholecalciferol (VITAMIN D3) 5000 units TABS Take by mouth.  . colchicine 0.6 MG tablet Take 0.6 mg by mouth 2 (two) times daily.  Marland Kitchen docusate sodium (COLACE) 100 MG capsule Take 100 mg by mouth daily.   Marland Kitchen donepezil (ARICEPT) 10 MG tablet Take 10 mg by mouth at bedtime.   . furosemide (LASIX) 20 MG tablet Take 20 mg by mouth daily.  Marland Kitchen gabapentin (NEURONTIN) 100 MG capsule Take 100 mg by mouth at bedtime.  . hydrALAZINE (APRESOLINE) 100 MG tablet Take 100 mg by mouth 3 (three) times daily.   . insulin aspart protamine- aspart (NOVOLOG MIX 70/30) (70-30) 100 UNIT/ML injection Inject into the skin.  Marland Kitchen insulin detemir (LEVEMIR) 100 UNIT/ML injection Inject into the skin at bedtime.  Marland Kitchen levothyroxine (SYNTHROID, LEVOTHROID) 112 MCG tablet TAKE 1 TABLET(112 MCG) BY MOUTH DAILY  . metoprolol succinate (TOPROL-XL) 50 MG 24 hr tablet Take 50 mg by mouth daily.   . Omega-3 Fatty Acids (FISH OIL) 1000 MG CAPS Take by mouth daily.  Marland Kitchen omeprazole (PRILOSEC) 20 MG capsule Take 20 mg by mouth  every 12 (twelve) hours.   . simvastatin (ZOCOR) 20 MG tablet Take 20 mg by mouth daily.   . sitaGLIPtin (JANUVIA) 100 MG tablet Take 100 mg by mouth daily.   . vitamin B-12 (CYANOCOBALAMIN) 1000 MCG tablet Take 1,000 mcg by mouth daily.    Past Medical History:  Diagnosis Date  . Clotting disorder ([place])   . Diabetes mellitus without complication ([place])   . Hyperlipidemia   . Hypertension   . Thyroid disease     No past surgical history on file.  Social History Social History  Substance Use Topics  . Smoking status: Never Smoker  . Smokeless tobacco: Never Used  . Alcohol use No    Family History Family History  Problem Relation Age of Onset  . Diabetes Mother   . Hyperlipidemia Mother   . Hypertension Father     No Known Allergies   REVIEW OF SYSTEMS (Negative unless checked)  Constitutional: [] Weight loss  [] Fever  [] Chills Cardiac: [] Chest pain   [] Chest pressure   [] Palpitations   [] Shortness of breath when laying flat   [] Shortness of breath with exertion. Vascular:  [] Pain in legs with walking   [] Pain in legs at rest  [] History of DVT   [] Phlebitis   [] Swelling in legs   [] Varicose veins   [] Non-healing ulcers Pulmonary:   [] Uses home oxygen   [] Productive cough   [] Hemoptysis   [] Wheeze  [] COPD   []   Asthma Neurologic:  [] Dizziness   [] Seizures   [] History of stroke   [] History of TIA  [] Aphasia   [] Vissual changes   [] Weakness or numbness in arm   [] Weakness or numbness in leg Musculoskeletal:   [] Joint swelling   [] Joint pain   [] Low back pain Hematologic:  [] Easy bruising  [] Easy bleeding   [] Hypercoagulable state   [] Anemic Gastrointestinal:  [] Diarrhea   [] Vomiting  [] Gastroesophageal reflux/heartburn   [] Difficulty swallowing. Genitourinary:  [x] Chronic kidney disease   [] Difficult urination  [] Frequent urination   [] Blood in urine Skin:  [] Rashes   [] Ulcers  Psychological:  [] History of anxiety   []  History of major depression.  Physical  Examination  Vitals:   12/10/16 1637  BP: 134/64  Pulse: 70  Resp: 16  Weight: 145 lb (65.8 kg)   Body mass index is 27.4 kg/m. Gen: WD/WN, NAD Head: /AT, No temporalis wasting.  Ear/Nose/Throat: Hearing grossly intact, nares w/o erythema or drainage Eyes: PER, EOMI, sclera nonicteric.  Neck: Supple, no large masses.   Pulmonary:  Good air movement, no audible wheezing bilaterally, no use of accessory muscles.  Cardiac: RRR, no JVD Vascular:  Vessel Right Left  Radial Palpable Palpable  Ulnar Palpable Palpable  Brachial Palpable Palpable  Carotid Palpable Palpable  Femoral Palpable Palpable  Popliteal Not Palpable Not Palpable  PT Not Palpable Not Palpable  DP Not Palpable Not Palpable  Gastrointestinal: Non-distended. No guarding/no peritoneal signs.  Musculoskeletal: M/S 5/5 throughout.  No deformity or atrophy.  Neurologic: CN 2-12 intact. Symmetrical.  Speech is fluent. Motor exam as listed above. Psychiatric: Judgment intact, Mood & affect appropriate for pt's clinical situation. Dermatologic: No rashes or ulcers noted.  No changes consistent with cellulitis. Lymph : No lichenification or skin changes of chronic lymphedema.  CBC Lab Results  Component Value Date   WBC 9.4 09/12/2013   HGB 12.0 (L) 09/12/2013   HCT 36.5 (L) 09/12/2013   MCV 84 09/12/2013   PLT 241 09/12/2013    BMET    Component Value Date/Time   NA 139 09/14/2013 0534   K 4.3 09/14/2013 0534   CL 108 (H) 09/14/2013 0534   CO2 26 09/14/2013 0534   GLUCOSE 146 (H) 09/14/2013 0534   BUN 17 09/14/2013 0534   CREATININE 1.71 (H) 09/14/2013 0534   CALCIUM 8.7 09/14/2013 0534   GFRNONAA 41 (L) 09/14/2013 0534   GFRAA 47 (L) 09/14/2013 0534   CrCl cannot be calculated (Patient's most recent lab result is older than the maximum 21 days allowed.).  COAG No results found for: INR, PROTIME  Radiology No results found.   Assessment/Plan 1. Chronic renal insufficiency, stage 5 (HCC) No  surgery for now we will continue to follow his renal function and when he nears dialysis we will plan for a left  Arm brachial axillary graft  2. PAD (peripheral artery disease) (HCC)  Recommend:  The patient has evidence of atherosclerosis of the lower extremities with claudication.  The patient does not voice lifestyle limiting changes at this point in time.  Noninvasive studies do not suggest clinically significant change.  No invasive studies, angiography or surgery at this time The patient should continue walking and begin a more formal exercise program.  The patient should continue antiplatelet therapy and aggressive treatment of the lipid abnormalities  No changes in the patient's medications at this time  The patient should continue wearing graduated compression socks 10-15 mmHg strength to control the mild edema.    3. Essential hypertension  Continue antihypertensive medications as already ordered, these medications have been reviewed and there are no changes at this time.   4. Type 2 diabetes mellitus with other specified complication, without long-term current use of insulin (HCC) Continue hypoglycemic medications as already ordered, these medications have been reviewed and there are no changes at this time.  Hgb A1C to be monitored as already arranged by primary service   5. Mixed hyperlipidemia Continue statin as ordered and reviewed, no changes at this time     Hortencia Pilar, MD  12/13/2016 2:17 PM

## 2016-12-15 DIAGNOSIS — E11319 Type 2 diabetes mellitus with unspecified diabetic retinopathy without macular edema: Secondary | ICD-10-CM | POA: Diagnosis not present

## 2016-12-15 DIAGNOSIS — L03031 Cellulitis of right toe: Secondary | ICD-10-CM | POA: Diagnosis not present

## 2016-12-15 DIAGNOSIS — M159 Polyosteoarthritis, unspecified: Secondary | ICD-10-CM | POA: Diagnosis not present

## 2016-12-15 DIAGNOSIS — H35049 Retinal micro-aneurysms, unspecified, unspecified eye: Secondary | ICD-10-CM | POA: Diagnosis not present

## 2016-12-17 DIAGNOSIS — M25511 Pain in right shoulder: Secondary | ICD-10-CM | POA: Diagnosis not present

## 2016-12-17 DIAGNOSIS — M5412 Radiculopathy, cervical region: Secondary | ICD-10-CM | POA: Diagnosis not present

## 2016-12-22 DIAGNOSIS — M5412 Radiculopathy, cervical region: Secondary | ICD-10-CM | POA: Diagnosis not present

## 2016-12-22 DIAGNOSIS — M25511 Pain in right shoulder: Secondary | ICD-10-CM | POA: Diagnosis not present

## 2017-03-12 DIAGNOSIS — D631 Anemia in chronic kidney disease: Secondary | ICD-10-CM | POA: Diagnosis not present

## 2017-03-12 DIAGNOSIS — N2581 Secondary hyperparathyroidism of renal origin: Secondary | ICD-10-CM | POA: Diagnosis not present

## 2017-03-12 DIAGNOSIS — N185 Chronic kidney disease, stage 5: Secondary | ICD-10-CM | POA: Diagnosis not present

## 2017-03-15 DIAGNOSIS — I10 Essential (primary) hypertension: Secondary | ICD-10-CM | POA: Diagnosis not present

## 2017-03-15 DIAGNOSIS — E119 Type 2 diabetes mellitus without complications: Secondary | ICD-10-CM | POA: Diagnosis not present

## 2017-03-15 DIAGNOSIS — Z23 Encounter for immunization: Secondary | ICD-10-CM | POA: Diagnosis not present

## 2017-03-15 DIAGNOSIS — E8881 Metabolic syndrome: Secondary | ICD-10-CM | POA: Diagnosis not present

## 2017-03-16 DIAGNOSIS — E1129 Type 2 diabetes mellitus with other diabetic kidney complication: Secondary | ICD-10-CM | POA: Diagnosis not present

## 2017-03-16 DIAGNOSIS — I120 Hypertensive chronic kidney disease with stage 5 chronic kidney disease or end stage renal disease: Secondary | ICD-10-CM | POA: Diagnosis not present

## 2017-03-16 DIAGNOSIS — N2581 Secondary hyperparathyroidism of renal origin: Secondary | ICD-10-CM | POA: Diagnosis not present

## 2017-03-16 DIAGNOSIS — N185 Chronic kidney disease, stage 5: Secondary | ICD-10-CM | POA: Diagnosis not present

## 2017-03-16 DIAGNOSIS — D631 Anemia in chronic kidney disease: Secondary | ICD-10-CM | POA: Diagnosis not present

## 2017-03-22 DIAGNOSIS — R69 Illness, unspecified: Secondary | ICD-10-CM | POA: Diagnosis not present

## 2017-03-29 DIAGNOSIS — R69 Illness, unspecified: Secondary | ICD-10-CM | POA: Diagnosis not present

## 2017-05-07 ENCOUNTER — Other Ambulatory Visit: Payer: Self-pay | Admitting: Cardiovascular Disease

## 2017-06-02 DIAGNOSIS — R69 Illness, unspecified: Secondary | ICD-10-CM | POA: Diagnosis not present

## 2017-08-02 DIAGNOSIS — D631 Anemia in chronic kidney disease: Secondary | ICD-10-CM | POA: Diagnosis not present

## 2017-08-02 DIAGNOSIS — N185 Chronic kidney disease, stage 5: Secondary | ICD-10-CM | POA: Diagnosis not present

## 2017-08-10 DIAGNOSIS — R42 Dizziness and giddiness: Secondary | ICD-10-CM | POA: Diagnosis not present

## 2017-08-10 DIAGNOSIS — E119 Type 2 diabetes mellitus without complications: Secondary | ICD-10-CM | POA: Diagnosis not present

## 2017-08-10 DIAGNOSIS — E8881 Metabolic syndrome: Secondary | ICD-10-CM | POA: Diagnosis not present

## 2017-08-10 DIAGNOSIS — R0602 Shortness of breath: Secondary | ICD-10-CM | POA: Diagnosis not present

## 2017-08-10 DIAGNOSIS — E1140 Type 2 diabetes mellitus with diabetic neuropathy, unspecified: Secondary | ICD-10-CM | POA: Diagnosis not present

## 2017-08-12 DIAGNOSIS — R5381 Other malaise: Secondary | ICD-10-CM | POA: Diagnosis not present

## 2017-08-12 DIAGNOSIS — E119 Type 2 diabetes mellitus without complications: Secondary | ICD-10-CM | POA: Diagnosis not present

## 2017-08-12 DIAGNOSIS — E7849 Other hyperlipidemia: Secondary | ICD-10-CM | POA: Diagnosis not present

## 2017-08-12 DIAGNOSIS — I10 Essential (primary) hypertension: Secondary | ICD-10-CM | POA: Diagnosis not present

## 2017-08-17 DIAGNOSIS — J00 Acute nasopharyngitis [common cold]: Secondary | ICD-10-CM | POA: Diagnosis not present

## 2017-08-17 DIAGNOSIS — E1140 Type 2 diabetes mellitus with diabetic neuropathy, unspecified: Secondary | ICD-10-CM | POA: Diagnosis not present

## 2017-08-17 DIAGNOSIS — R42 Dizziness and giddiness: Secondary | ICD-10-CM | POA: Diagnosis not present

## 2017-08-17 DIAGNOSIS — R0602 Shortness of breath: Secondary | ICD-10-CM | POA: Diagnosis not present

## 2017-08-23 DIAGNOSIS — N185 Chronic kidney disease, stage 5: Secondary | ICD-10-CM | POA: Diagnosis not present

## 2017-08-23 DIAGNOSIS — N2581 Secondary hyperparathyroidism of renal origin: Secondary | ICD-10-CM | POA: Diagnosis not present

## 2017-08-23 DIAGNOSIS — I120 Hypertensive chronic kidney disease with stage 5 chronic kidney disease or end stage renal disease: Secondary | ICD-10-CM | POA: Diagnosis not present

## 2017-08-23 DIAGNOSIS — D631 Anemia in chronic kidney disease: Secondary | ICD-10-CM | POA: Diagnosis not present

## 2017-08-25 ENCOUNTER — Other Ambulatory Visit: Payer: Self-pay

## 2017-08-25 DIAGNOSIS — N185 Chronic kidney disease, stage 5: Secondary | ICD-10-CM

## 2017-08-28 DIAGNOSIS — R69 Illness, unspecified: Secondary | ICD-10-CM | POA: Diagnosis not present

## 2017-08-30 ENCOUNTER — Encounter: Payer: Self-pay | Admitting: Podiatry

## 2017-08-30 ENCOUNTER — Ambulatory Visit (INDEPENDENT_AMBULATORY_CARE_PROVIDER_SITE_OTHER): Payer: Medicare HMO | Admitting: Podiatry

## 2017-08-30 VITALS — BP 184/68 | HR 65

## 2017-08-30 DIAGNOSIS — L600 Ingrowing nail: Secondary | ICD-10-CM

## 2017-08-30 DIAGNOSIS — M79675 Pain in left toe(s): Secondary | ICD-10-CM | POA: Diagnosis not present

## 2017-08-30 DIAGNOSIS — B351 Tinea unguium: Secondary | ICD-10-CM

## 2017-08-30 DIAGNOSIS — L03032 Cellulitis of left toe: Secondary | ICD-10-CM | POA: Diagnosis not present

## 2017-08-30 NOTE — Progress Notes (Addendum)
This patient presents to the office with chief complaint of a painful ingrown toenail on the outside border of the left big toe.  Patient states that this outside border is painful walking and wearing his shoes.  He says he had the same problem with the big toe on the right foot and surgery was performed.patient states that this pain has been increasing in severity and he presents the office today for definitive evaluation and treatment of this painful  ingrown toenail. This patient is type 2 diabetic.  General Appearance  Alert, conversant and in no acute stress.  Vascular  Dorsalis pedis and posterior tibial  pulses are palpable  bilaterally.  Capillary return is within normal limits  bilaterally. Temperature is within normal limits  bilaterally.  Neurologic  Senn-Weinstein monofilament wire test within normal limits  bilaterally. Muscle power within normal limits bilaterally.  Nails  Marked incurvation noted along the lateral border of the left great toe.  There is evidence of desquamation noted along the lateral border of the left great toe.  Thick disfigured discolored hallux  toenails bilaterally.  Orthopedic  No limitations of motion of motion feet .  No crepitus or effusions noted.  HAV  B/L  Skin  normotropic skin with no porokeratosis noted bilaterally.  No signs of infections or ulcers noted.   Ingrown Toenail lateral border left great toenail.  Onychomycosis  Hallux  B/L   IE>  Nail surgery.  Treatment options and alternatives discussed.  Recommended permanent phenol matrixectomy and patient agreed.  Left hallux  was prepped with alcohol and a toe block of 3cc of 2% lidocaine plain was administered in a digital toe block. .  The toe was then prepped with betadine solution .  The offending nail border was then excised and matrix tissue exposed.  Phenol was then applied to the matrix tissue followed by an alcohol wash.  Antibiotic ointment and a dry sterile dressing was applied.  The  patient was dispensed instructions for aftercare.  RTC 10 days.     Gardiner Barefoot DPM

## 2017-09-06 ENCOUNTER — Ambulatory Visit: Payer: Medicare HMO | Admitting: Podiatry

## 2017-09-22 ENCOUNTER — Ambulatory Visit (HOSPITAL_COMMUNITY)
Admission: RE | Admit: 2017-09-22 | Discharge: 2017-09-22 | Disposition: A | Discharge status: Home / Self Care | Payer: Medicare HMO | Source: Ambulatory Visit | Attending: Vascular Surgery | Admitting: Vascular Surgery

## 2017-09-22 ENCOUNTER — Other Ambulatory Visit: Payer: Self-pay

## 2017-09-22 ENCOUNTER — Ambulatory Visit (INDEPENDENT_AMBULATORY_CARE_PROVIDER_SITE_OTHER): Payer: Medicare HMO | Admitting: Vascular Surgery

## 2017-09-22 ENCOUNTER — Encounter: Payer: Self-pay | Admitting: Vascular Surgery

## 2017-09-22 VITALS — BP 150/66 | HR 58 | Temp 97.0°F | Resp 16 | Ht 63.0 in | Wt 143.0 lb

## 2017-09-22 DIAGNOSIS — N185 Chronic kidney disease, stage 5: Secondary | ICD-10-CM | POA: Insufficient documentation

## 2017-09-22 DIAGNOSIS — Z01818 Encounter for other preprocedural examination: Secondary | ICD-10-CM | POA: Diagnosis not present

## 2017-09-22 NOTE — H&P (View-Only) (Signed)
Requested by:  Elmarie Shiley, MD [place], [place] [address]  Reason for consultation: New access   History of Present Illness   Joseph Mcpherson is a 72 y.o. ([birth year]) male who presents for evaluation for permanent access.  The patient is right hand dominant.  The patient has not had previous access procedures.  Previous central venous cannulation procedures include: none.  The patient has never had a PPM placed.  Renal disease is felt to be related his diabetes.  Past Medical History:  Diagnosis Date  . Clotting disorder ([place])   . Diabetes mellitus without complication ([place])   . Hyperlipidemia   . Hypertension   . Thyroid disease    Past Surgical History: none noted   Social History   Socioeconomic History  . Marital status: Married    Spouse name: Not on file  . Number of children: Not on file  . Years of education: Not on file  . Highest education level: Not on file  Occupational History  . Not on file  Social Needs  . Financial resource strain: Not on file  . Food insecurity:    Worry: Not on file    Inability: Not on file  . Transportation needs:    Medical: Not on file    Non-medical: Not on file  Tobacco Use  . Smoking status: Never Smoker  . Smokeless tobacco: Never Used  Substance and Sexual Activity  . Alcohol use: No  . Drug use: No  . Sexual activity: Not on file  Lifestyle  . Physical activity:    Days per week: Not on file    Minutes per session: Not on file  . Stress: Not on file  Relationships  . Social connections:    Talks on phone: Not on file    Gets together: Not on file    Attends religious service: Not on file    Active member of club or organization: Not on file    Attends meetings of clubs or organizations: Not on file    Relationship status: Not on file  . Intimate partner violence:    Fear of current or ex partner: Not on file    Emotionally abused: Not on file    Physically abused: Not on file    Forced sexual activity:  Not on file  Other Topics Concern  . Not on file  Social History Narrative  . Not on file    Family History  Problem Relation Age of Onset  . Diabetes Mother   . Hyperlipidemia Mother   . Hypertension Father     Current Outpatient Medications  Medication Sig Dispense Refill  . allopurinol (ZYLOPRIM) 100 MG tablet Take 100 mg by mouth daily.     Marland Kitchen amLODipine (NORVASC) 10 MG tablet TAKE 1 TABLET (10 MG TOTAL) BY MOUTH DAILY. 30 tablet 6  . aspirin EC 81 MG tablet Take by mouth.    . Cholecalciferol (VITAMIN D3) 5000 units TABS Take by mouth.    . donepezil (ARICEPT) 10 MG tablet Take 10 mg by mouth at bedtime.     . furosemide (LASIX) 20 MG tablet Take 20 mg by mouth daily.    Marland Kitchen gabapentin (NEURONTIN) 100 MG capsule Take 100 mg by mouth at bedtime.    . hydrALAZINE (APRESOLINE) 100 MG tablet Take 100 mg by mouth 3 (three) times daily.     . insulin aspart protamine- aspart (NOVOLOG MIX 70/30) (70-30) 100 UNIT/ML injection Inject into the skin.    Marland Kitchen  insulin detemir (LEVEMIR) 100 UNIT/ML injection Inject into the skin at bedtime.    Marland Kitchen levothyroxine (SYNTHROID, LEVOTHROID) 112 MCG tablet TAKE 1 TABLET(112 MCG) BY MOUTH DAILY    . metoprolol succinate (TOPROL-XL) 50 MG 24 hr tablet Take 50 mg by mouth daily.     . Omega-3 Fatty Acids (FISH OIL) 1000 MG CAPS Take by mouth daily.    Marland Kitchen omeprazole (PRILOSEC) 20 MG capsule Take 20 mg by mouth every 12 (twelve) hours.     . simvastatin (ZOCOR) 20 MG tablet Take 20 mg by mouth daily.     . sitaGLIPtin (JANUVIA) 100 MG tablet Take 100 mg by mouth daily.     . vitamin B-12 (CYANOCOBALAMIN) 1000 MCG tablet Take 1,000 mcg by mouth daily.    . colchicine 0.6 MG tablet Take 0.6 mg by mouth 2 (two) times daily.    Marland Kitchen docusate sodium (COLACE) 100 MG capsule Take 100 mg by mouth daily.      No current facility-administered medications for this visit.     No Known Allergies  REVIEW OF SYSTEMS (negative unless checked):   Cardiac:  []  Chest pain  or chest pressure? []  Shortness of breath upon activity? []  Shortness of breath when lying flat? []  Irregular heart rhythm?  Vascular:  []  Pain in calf, thigh, or hip brought on by walking? []  Pain in feet at night that wakes you up from your sleep? []  Blood clot in your veins? [x]  Leg swelling?  Pulmonary:  []  Oxygen at home? []  Productive cough? []  Wheezing?  Neurologic:  []  Sudden weakness in arms or legs? []  Sudden numbness in arms or legs? []  Sudden onset of difficult speaking or slurred speech? []  Temporary loss of vision in one eye? []  Problems with dizziness?  Gastrointestinal:  []  Blood in stool? []  Vomited blood?  Genitourinary:  []  Burning when urinating? []  Blood in urine?  Psychiatric:  []  Major depression  Hematologic:  []  Bleeding problems? []  Problems with blood clotting?  Dermatologic:  []  Rashes or ulcers?  Constitutional:  []  Fever or chills?  Ear/Nose/Throat:  []  Change in hearing? []  Nose bleeds? []  Sore throat?  Musculoskeletal:  []  Back pain? []  Joint pain? []  Muscle pain?   Physical Examination     Vitals:   09/22/17 1309 09/22/17 1312  BP: (!) 144/66 (!) 150/66  Pulse: (!) 58 (!) 58  Resp: 16   Temp: (!) 97 F (36.1 C)   TempSrc: Oral   SpO2: 100%   Weight: 143 lb (64.9 kg)   Height: 5\' 3"  (1.6 m)    Body mass index is 25.33 kg/m.  General Alert, O x 3, WD, NAD  Head [place]/AT,    Ear/Nose/ Throat Hearing grossly intact, nares without erythema or drainage, oropharynx without Erythema or Exudate, Mallampati score: 3,   Eyes PERRLA, EOMI,    Neck Supple, mid-line trachea,    Pulmonary Sym exp, good B air movt, CTA B  Cardiac RRR, Nl S1, S2, no Murmurs, No rubs, No S3,S4  Vascular Vessel Right Left  Radial Palpable Palpable  Brachial Palpable Palpable  Carotid Palpable, No Bruit Palpable, No Bruit  Aorta Not palpable N/A  Femoral Palpable Palpable  Popliteal Not palpable Not palpable  PT Not palpable Not  palpable  DP Palpable Palpable    Gastro- intestinal soft, non-distended, non-tender to palpation, No guarding or rebound, no HSM, no masses, no CVAT B, No palpable prominent aortic pulse,    Musculo- skeletal M/S 5/5 throughout  ,  Extremities without ischemic changes  , No edema present, No visible varicosities , No Lipodermatosclerosis present, R brachial vein: 2.5-3.0 mm, Distal cephalic vein appears to drain into a cubital vein: 3.0 mm  Neurologic Cranial nerves grossly intact , Pain and light touch intact in extremities , Motor exam as listed above  Psychiatric Judgement intact, Mood & affect appropriate for pt's clinical situation  Dermatologic See M/S exam for extremity exam, No rashes otherwise noted  Lymphatic  Palpable lymph nodes: None     Non-invasive Vascular Imaging   BUE Vein Mapping  (Date: 09/22/2017):   R arm: acceptable vein conduits include marginal upper arm cephalic  L arm: acceptable vein conduits include none  BUE Doppler (Date: 09/22/2017):   R arm:   Brachial: bi, 3.8 mm  Radial: bi, 1.8 mm  Ulnar: bi, 1.4 mm  Diffuse plaque is present in all arteries  L arm:   Brachial: bi, 4.3 mm  Radial: bi, 1.8 mm  Ulnar: bi, 1.3 mm  Diffuse plaque is present in all arteries   Outside Studies/Documentation   8 pages of outside documents were reviewed including: outpatient nephrology chart.   Medical Decision Making   Deagen Krass is a 72 y.o. male who presents with chronic kidney disease stage V   Based on vein mapping and examination, this patient's permanent access options include: R BC AVF vs R 1st stage BrVT.  I would first proceed with right brachiocephalic arteriovenous fistula .  I had an extensive discussion with this patient in regards to the nature of access surgery, including risk, benefits, and alternatives.    The patient is aware that the risks of access surgery include but are not limited to: bleeding, infection, steal syndrome,  nerve damage, ischemic monomelic neuropathy, failure of access to mature, complications related to venous hypertension, and possible need for additional access procedures in the future.  The patient has NOT agreed to proceed with the above procedure.  Patient wants to talk it over with his kids prior to proceeding.   Adele Barthel, MD, FACS [facility] Office: [phone] Pager: [phone]  09/22/2017, 1:17 PM

## 2017-09-22 NOTE — Progress Notes (Addendum)
Requested by:  Elmarie Shiley, MD [place], [place] [address]  Reason for consultation: New access   History of Present Illness   Joseph Mcpherson is a 72 y.o. (09/06/1945) male who presents for evaluation for permanent access.  The patient is right hand dominant.  The patient has not had previous access procedures.  Previous central venous cannulation procedures include: none.  The patient has never had a PPM placed.  Renal disease is felt to be related his diabetes.  Past Medical History:  Diagnosis Date  . Clotting disorder ([place])   . Diabetes mellitus without complication ([place])   . Hyperlipidemia   . Hypertension   . Thyroid disease    Past Surgical History: none noted   Social History   Socioeconomic History  . Marital status: Married    Spouse name: Not on file  . Number of children: Not on file  . Years of education: Not on file  . Highest education level: Not on file  Occupational History  . Not on file  Social Needs  . Financial resource strain: Not on file  . Food insecurity:    Worry: Not on file    Inability: Not on file  . Transportation needs:    Medical: Not on file    Non-medical: Not on file  Tobacco Use  . Smoking status: Never Smoker  . Smokeless tobacco: Never Used  Substance and Sexual Activity  . Alcohol use: No  . Drug use: No  . Sexual activity: Not on file  Lifestyle  . Physical activity:    Days per week: Not on file    Minutes per session: Not on file  . Stress: Not on file  Relationships  . Social connections:    Talks on phone: Not on file    Gets together: Not on file    Attends religious service: Not on file    Active member of club or organization: Not on file    Attends meetings of clubs or organizations: Not on file    Relationship status: Not on file  . Intimate partner violence:    Fear of current or ex partner: Not on file    Emotionally abused: Not on file    Physically abused: Not on file    Forced sexual activity:  Not on file  Other Topics Concern  . Not on file  Social History Narrative  . Not on file    Family History  Problem Relation Age of Onset  . Diabetes Mother   . Hyperlipidemia Mother   . Hypertension Father     Current Outpatient Medications  Medication Sig Dispense Refill  . allopurinol (ZYLOPRIM) 100 MG tablet Take 100 mg by mouth daily.     Marland Kitchen amLODipine (NORVASC) 10 MG tablet TAKE 1 TABLET (10 MG TOTAL) BY MOUTH DAILY. 30 tablet 6  . aspirin EC 81 MG tablet Take by mouth.    . Cholecalciferol (VITAMIN D3) 5000 units TABS Take by mouth.    . donepezil (ARICEPT) 10 MG tablet Take 10 mg by mouth at bedtime.     . furosemide (LASIX) 20 MG tablet Take 20 mg by mouth daily.    Marland Kitchen gabapentin (NEURONTIN) 100 MG capsule Take 100 mg by mouth at bedtime.    . hydrALAZINE (APRESOLINE) 100 MG tablet Take 100 mg by mouth 3 (three) times daily.     . insulin aspart protamine- aspart (NOVOLOG MIX 70/30) (70-30) 100 UNIT/ML injection Inject into the skin.    Marland Kitchen  insulin detemir (LEVEMIR) 100 UNIT/ML injection Inject into the skin at bedtime.    Marland Kitchen levothyroxine (SYNTHROID, LEVOTHROID) 112 MCG tablet TAKE 1 TABLET(112 MCG) BY MOUTH DAILY    . metoprolol succinate (TOPROL-XL) 50 MG 24 hr tablet Take 50 mg by mouth daily.     . Omega-3 Fatty Acids (FISH OIL) 1000 MG CAPS Take by mouth daily.    Marland Kitchen omeprazole (PRILOSEC) 20 MG capsule Take 20 mg by mouth every 12 (twelve) hours.     . simvastatin (ZOCOR) 20 MG tablet Take 20 mg by mouth daily.     . sitaGLIPtin (JANUVIA) 100 MG tablet Take 100 mg by mouth daily.     . vitamin B-12 (CYANOCOBALAMIN) 1000 MCG tablet Take 1,000 mcg by mouth daily.    . colchicine 0.6 MG tablet Take 0.6 mg by mouth 2 (two) times daily.    Marland Kitchen docusate sodium (COLACE) 100 MG capsule Take 100 mg by mouth daily.      No current facility-administered medications for this visit.     No Known Allergies  REVIEW OF SYSTEMS (negative unless checked):   Cardiac:  []  Chest pain  or chest pressure? []  Shortness of breath upon activity? []  Shortness of breath when lying flat? []  Irregular heart rhythm?  Vascular:  []  Pain in calf, thigh, or hip brought on by walking? []  Pain in feet at night that wakes you up from your sleep? []  Blood clot in your veins? [x]  Leg swelling?  Pulmonary:  []  Oxygen at home? []  Productive cough? []  Wheezing?  Neurologic:  []  Sudden weakness in arms or legs? []  Sudden numbness in arms or legs? []  Sudden onset of difficult speaking or slurred speech? []  Temporary loss of vision in one eye? []  Problems with dizziness?  Gastrointestinal:  []  Blood in stool? []  Vomited blood?  Genitourinary:  []  Burning when urinating? []  Blood in urine?  Psychiatric:  []  Major depression  Hematologic:  []  Bleeding problems? []  Problems with blood clotting?  Dermatologic:  []  Rashes or ulcers?  Constitutional:  []  Fever or chills?  Ear/Nose/Throat:  []  Change in hearing? []  Nose bleeds? []  Sore throat?  Musculoskeletal:  []  Back pain? []  Joint pain? []  Muscle pain?   Physical Examination     Vitals:   09/22/17 1309 09/22/17 1312  BP: (!) 144/66 (!) 150/66  Pulse: (!) 58 (!) 58  Resp: 16   Temp: (!) 97 F (36.1 C)   TempSrc: Oral   SpO2: 100%   Weight: 143 lb (64.9 kg)   Height: 5\' 3"  (1.6 m)    Body mass index is 25.33 kg/m.  General Alert, O x 3, WD, NAD  Head [place]/AT,    Ear/Nose/ Throat Hearing grossly intact, nares without erythema or drainage, oropharynx without Erythema or Exudate, Mallampati score: 3,   Eyes PERRLA, EOMI,    Neck Supple, mid-line trachea,    Pulmonary Sym exp, good B air movt, CTA B  Cardiac RRR, Nl S1, S2, no Murmurs, No rubs, No S3,S4  Vascular Vessel Right Left  Radial Palpable Palpable  Brachial Palpable Palpable  Carotid Palpable, No Bruit Palpable, No Bruit  Aorta Not palpable N/A  Femoral Palpable Palpable  Popliteal Not palpable Not palpable  PT Not palpable Not  palpable  DP Palpable Palpable    Gastro- intestinal soft, non-distended, non-tender to palpation, No guarding or rebound, no HSM, no masses, no CVAT B, No palpable prominent aortic pulse,    Musculo- skeletal M/S 5/5 throughout  ,  Extremities without ischemic changes  , No edema present, No visible varicosities , No Lipodermatosclerosis present, R brachial vein: 2.5-3.0 mm, Distal cephalic vein appears to drain into a cubital vein: 3.0 mm  Neurologic Cranial nerves grossly intact , Pain and light touch intact in extremities , Motor exam as listed above  Psychiatric Judgement intact, Mood & affect appropriate for pt's clinical situation  Dermatologic See M/S exam for extremity exam, No rashes otherwise noted  Lymphatic  Palpable lymph nodes: None     Non-invasive Vascular Imaging   BUE Vein Mapping  (Date: 09/22/2017):   R arm: acceptable vein conduits include marginal upper arm cephalic  L arm: acceptable vein conduits include none  BUE Doppler (Date: 09/22/2017):   R arm:   Brachial: bi, 3.8 mm  Radial: bi, 1.8 mm  Ulnar: bi, 1.4 mm  Diffuse plaque is present in all arteries  L arm:   Brachial: bi, 4.3 mm  Radial: bi, 1.8 mm  Ulnar: bi, 1.3 mm  Diffuse plaque is present in all arteries   Outside Studies/Documentation   8 pages of outside documents were reviewed including: outpatient nephrology chart.   Medical Decision Making   Joseph Mcpherson is a 72 y.o. male who presents with chronic kidney disease stage V   Based on vein mapping and examination, this patient's permanent access options include: R BC AVF vs R 1st stage BrVT.  I would first proceed with right brachiocephalic arteriovenous fistula .  I had an extensive discussion with this patient in regards to the nature of access surgery, including risk, benefits, and alternatives.    The patient is aware that the risks of access surgery include but are not limited to: bleeding, infection, steal syndrome,  nerve damage, ischemic monomelic neuropathy, failure of access to mature, complications related to venous hypertension, and possible need for additional access procedures in the future.  The patient has NOT agreed to proceed with the above procedure.  Patient wants to talk it over with his kids prior to proceeding.   Adele Barthel, MD, FACS [facility] Office: [phone] Pager: [phone]  09/22/2017, 1:17 PM

## 2017-09-22 NOTE — Progress Notes (Signed)
Vitals:   09/22/17 1309  BP: (!) 144/66  Pulse: (!) 58  Resp: 16  Temp: (!) 97 F (36.1 C)  TempSrc: Oral  SpO2: 100%  Weight: 143 lb (64.9 kg)  Height: 5\' 3"  (1.6 m)

## 2017-09-24 ENCOUNTER — Other Ambulatory Visit: Payer: Self-pay | Admitting: *Deleted

## 2017-10-21 ENCOUNTER — Other Ambulatory Visit: Payer: Self-pay

## 2017-10-21 ENCOUNTER — Encounter (HOSPITAL_COMMUNITY): Payer: Self-pay

## 2017-10-21 NOTE — Progress Notes (Signed)
PCP - Dr. Posey Pronto Cardiologist - patient denies Endocrinologist - Dr. Ralene Cork  Chest x-ray - n/a EKG - DOS Stress Test - 07/12/2016 ECHO - 09/14/2014 Cardiac Cath - patient denies  Sleep Study - patient denies CPAP -   Fasting Blood Sugar - unsure Checks Blood Sugar 0 times a day  Aspirin Instructions: patient's last dose was 10/11/2017  Anesthesia review: n/a  Patient denies shortness of breath, fever, cough and chest pain at PAT appointment   Patient verbalized understanding of instructions that were given to them at the PAT appointment. Patient was also instructed that they will need to review over the PAT instructions again at home before surgery.

## 2017-10-22 ENCOUNTER — Telehealth: Payer: Self-pay | Admitting: Vascular Surgery

## 2017-10-22 ENCOUNTER — Encounter (HOSPITAL_COMMUNITY)
Admission: RE | Disposition: A | Discharge status: Home / Self Care | Payer: Self-pay | Source: Ambulatory Visit | Attending: Vascular Surgery

## 2017-10-22 ENCOUNTER — Ambulatory Visit (HOSPITAL_COMMUNITY)
Admission: RE | Admit: 2017-10-22 | Discharge: 2017-10-22 | Disposition: A | Discharge status: Home / Self Care | Payer: Medicare HMO | Source: Ambulatory Visit | Attending: Vascular Surgery | Admitting: Vascular Surgery

## 2017-10-22 ENCOUNTER — Ambulatory Visit (HOSPITAL_COMMUNITY): Payer: Medicare HMO | Admitting: Certified Registered"

## 2017-10-22 ENCOUNTER — Encounter (HOSPITAL_COMMUNITY): Payer: Self-pay | Admitting: *Deleted

## 2017-10-22 DIAGNOSIS — Z794 Long term (current) use of insulin: Secondary | ICD-10-CM | POA: Diagnosis not present

## 2017-10-22 DIAGNOSIS — Z79899 Other long term (current) drug therapy: Secondary | ICD-10-CM | POA: Diagnosis not present

## 2017-10-22 DIAGNOSIS — E785 Hyperlipidemia, unspecified: Secondary | ICD-10-CM | POA: Insufficient documentation

## 2017-10-22 DIAGNOSIS — I120 Hypertensive chronic kidney disease with stage 5 chronic kidney disease or end stage renal disease: Secondary | ICD-10-CM | POA: Diagnosis not present

## 2017-10-22 DIAGNOSIS — N184 Chronic kidney disease, stage 4 (severe): Secondary | ICD-10-CM | POA: Insufficient documentation

## 2017-10-22 DIAGNOSIS — Z7982 Long term (current) use of aspirin: Secondary | ICD-10-CM | POA: Diagnosis not present

## 2017-10-22 DIAGNOSIS — I129 Hypertensive chronic kidney disease with stage 1 through stage 4 chronic kidney disease, or unspecified chronic kidney disease: Secondary | ICD-10-CM | POA: Insufficient documentation

## 2017-10-22 DIAGNOSIS — E1151 Type 2 diabetes mellitus with diabetic peripheral angiopathy without gangrene: Secondary | ICD-10-CM | POA: Diagnosis not present

## 2017-10-22 DIAGNOSIS — E1122 Type 2 diabetes mellitus with diabetic chronic kidney disease: Secondary | ICD-10-CM | POA: Insufficient documentation

## 2017-10-22 DIAGNOSIS — N186 End stage renal disease: Secondary | ICD-10-CM | POA: Diagnosis not present

## 2017-10-22 HISTORY — DX: Hypothyroidism, unspecified: E03.9

## 2017-10-22 HISTORY — DX: Chronic kidney disease, unspecified: N18.9

## 2017-10-22 HISTORY — PX: AV FISTULA PLACEMENT: SHX1204

## 2017-10-22 HISTORY — DX: Gastro-esophageal reflux disease without esophagitis: K21.9

## 2017-10-22 LAB — POCT I-STAT 4, (NA,K, GLUC, HGB,HCT)
Glucose, Bld: 113 mg/dL — ABNORMAL HIGH (ref 65–99)
HEMATOCRIT: 24 % — AB (ref 39.0–52.0)
HEMOGLOBIN: 8.2 g/dL — AB (ref 13.0–17.0)
POTASSIUM: 4.3 mmol/L (ref 3.5–5.1)
Sodium: 137 mmol/L (ref 135–145)

## 2017-10-22 LAB — GLUCOSE, CAPILLARY
Glucose-Capillary: 105 mg/dL — ABNORMAL HIGH (ref 65–99)
Glucose-Capillary: 123 mg/dL — ABNORMAL HIGH (ref 65–99)

## 2017-10-22 SURGERY — ARTERIOVENOUS (AV) FISTULA CREATION
Anesthesia: Monitor Anesthesia Care | Site: Arm Upper | Laterality: Right

## 2017-10-22 MED ORDER — FENTANYL CITRATE (PF) 100 MCG/2ML IJ SOLN
INTRAMUSCULAR | Status: DC | PRN
  Administered 2017-10-22: 50 ug via INTRAVENOUS

## 2017-10-22 MED ORDER — LIDOCAINE HCL (PF) 1 % IJ SOLN
INTRAMUSCULAR | Status: AC
  Filled 2017-10-22: qty 30

## 2017-10-22 MED ORDER — CEFAZOLIN SODIUM-DEXTROSE 2-4 GM/100ML-% IV SOLN
2.0000 g | INTRAVENOUS | Status: AC
  Administered 2017-10-22: 2 g via INTRAVENOUS
  Filled 2017-10-22: qty 100

## 2017-10-22 MED ORDER — ONDANSETRON HCL 4 MG/2ML IJ SOLN
INTRAMUSCULAR | Status: DC | PRN
  Administered 2017-10-22: 4 mg via INTRAVENOUS

## 2017-10-22 MED ORDER — PROTAMINE SULFATE 10 MG/ML IV SOLN
INTRAVENOUS | Status: AC
  Filled 2017-10-22: qty 5

## 2017-10-22 MED ORDER — 0.9 % SODIUM CHLORIDE (POUR BTL) OPTIME
TOPICAL | Status: DC | PRN
  Administered 2017-10-22: 1000 mL

## 2017-10-22 MED ORDER — OXYCODONE-ACETAMINOPHEN 5-325 MG PO TABS: 1.0000 | ORAL_TABLET | Freq: Four times a day (QID) | ORAL | 0 refills | Status: DC | PRN

## 2017-10-22 MED ORDER — SODIUM CHLORIDE 0.9 % IV SOLN
INTRAVENOUS | Status: DC
  Administered 2017-10-22: 07:00:00 via INTRAVENOUS

## 2017-10-22 MED ORDER — PROPOFOL 500 MG/50ML IV EMUL
INTRAVENOUS | Status: DC | PRN
  Administered 2017-10-22: 75 ug/kg/min via INTRAVENOUS

## 2017-10-22 MED ORDER — CHLORHEXIDINE GLUCONATE 4 % EX LIQD: 60.0000 mL | Freq: Once | CUTANEOUS | Status: DC

## 2017-10-22 MED ORDER — DEXAMETHASONE SODIUM PHOSPHATE 10 MG/ML IJ SOLN
INTRAMUSCULAR | Status: DC | PRN
  Administered 2017-10-22: 5 mg via INTRAVENOUS

## 2017-10-22 MED ORDER — FENTANYL CITRATE (PF) 250 MCG/5ML IJ SOLN
INTRAMUSCULAR | Status: AC
  Filled 2017-10-22: qty 5

## 2017-10-22 MED ORDER — LIDOCAINE 2% (20 MG/ML) 5 ML SYRINGE
INTRAMUSCULAR | Status: AC
  Filled 2017-10-22: qty 5

## 2017-10-22 MED ORDER — PROPOFOL 10 MG/ML IV BOLUS
INTRAVENOUS | Status: DC | PRN
  Administered 2017-10-22: 10 mg via INTRAVENOUS
  Administered 2017-10-22: 20 mg via INTRAVENOUS

## 2017-10-22 MED ORDER — MIDAZOLAM HCL 2 MG/2ML IJ SOLN
INTRAMUSCULAR | Status: AC
  Filled 2017-10-22: qty 2

## 2017-10-22 MED ORDER — PROPOFOL 1000 MG/100ML IV EMUL
INTRAVENOUS | Status: AC
  Filled 2017-10-22: qty 100

## 2017-10-22 MED ORDER — SODIUM CHLORIDE 0.9 % IV SOLN
INTRAVENOUS | Status: DC | PRN
  Administered 2017-10-22: 07:00:00

## 2017-10-22 MED ORDER — LIDOCAINE HCL (PF) 1 % IJ SOLN
INTRAMUSCULAR | Status: DC | PRN
  Administered 2017-10-22: 30 mL

## 2017-10-22 MED ORDER — MIDAZOLAM HCL 5 MG/5ML IJ SOLN
INTRAMUSCULAR | Status: DC | PRN
  Administered 2017-10-22 (×2): 1 mg via INTRAVENOUS

## 2017-10-22 MED ORDER — ONDANSETRON HCL 4 MG/2ML IJ SOLN
INTRAMUSCULAR | Status: AC
  Filled 2017-10-22: qty 2

## 2017-10-22 MED ORDER — SODIUM CHLORIDE 0.9 % IV SOLN
INTRAVENOUS | Status: AC
  Filled 2017-10-22: qty 1.2

## 2017-10-22 MED ORDER — PROPOFOL 10 MG/ML IV BOLUS
INTRAVENOUS | Status: AC
  Filled 2017-10-22: qty 20

## 2017-10-22 SURGICAL SUPPLY — 30 items
ARMBAND PINK RESTRICT EXTREMIT (MISCELLANEOUS) ×4 IMPLANT
CANISTER SUCT 3000ML PPV (MISCELLANEOUS) ×2 IMPLANT
CLIP VESOCCLUDE MED 6/CT (CLIP) ×2 IMPLANT
CLIP VESOCCLUDE SM WIDE 6/CT (CLIP) ×2 IMPLANT
COVER PROBE W GEL 5X96 (DRAPES) ×2 IMPLANT
DECANTER SPIKE VIAL GLASS SM (MISCELLANEOUS) ×2 IMPLANT
DERMABOND ADVANCED (GAUZE/BANDAGES/DRESSINGS) ×1
DERMABOND ADVANCED .7 DNX12 (GAUZE/BANDAGES/DRESSINGS) ×1 IMPLANT
ELECT REM PT RETURN 9FT ADLT (ELECTROSURGICAL) ×2
ELECTRODE REM PT RTRN 9FT ADLT (ELECTROSURGICAL) ×1 IMPLANT
GLOVE BIO SURGEON STRL SZ7 (GLOVE) ×2 IMPLANT
GLOVE BIOGEL PI IND STRL 6.5 (GLOVE) ×2 IMPLANT
GLOVE BIOGEL PI IND STRL 7.5 (GLOVE) ×1 IMPLANT
GLOVE BIOGEL PI INDICATOR 6.5 (GLOVE) ×2
GLOVE BIOGEL PI INDICATOR 7.5 (GLOVE) ×1
GLOVE ECLIPSE 6.5 STRL STRAW (GLOVE) ×2 IMPLANT
GOWN STRL REUS W/ TWL LRG LVL3 (GOWN DISPOSABLE) ×4 IMPLANT
GOWN STRL REUS W/TWL LRG LVL3 (GOWN DISPOSABLE) ×4
KIT BASIN OR (CUSTOM PROCEDURE TRAY) ×2 IMPLANT
KIT TURNOVER KIT B (KITS) ×2 IMPLANT
NS IRRIG 1000ML POUR BTL (IV SOLUTION) ×2 IMPLANT
PACK CV ACCESS (CUSTOM PROCEDURE TRAY) ×2 IMPLANT
PAD ARMBOARD 7.5X6 YLW CONV (MISCELLANEOUS) ×4 IMPLANT
SUT MNCRL AB 4-0 PS2 18 (SUTURE) ×2 IMPLANT
SUT PROLENE 7 0 BV 1 (SUTURE) ×2 IMPLANT
SUT VIC AB 3-0 SH 27 (SUTURE) ×1
SUT VIC AB 3-0 SH 27X BRD (SUTURE) ×1 IMPLANT
TOWEL GREEN STERILE (TOWEL DISPOSABLE) ×2 IMPLANT
UNDERPAD 30X30 (UNDERPADS AND DIAPERS) ×2 IMPLANT
WATER STERILE IRR 1000ML POUR (IV SOLUTION) ×2 IMPLANT

## 2017-10-22 NOTE — Discharge Instructions (Signed)
° °  [facility] ° °Discharge Instructions ° °AV Fistula or Graft Surgery for Dialysis Access ° °Please refer to the following instructions for your post-procedure care. Your surgeon or physician assistant will discuss any changes with you. ° °Activity ° °You may drive the day following your surgery, if you are comfortable and no longer taking prescription pain medication. Resume full activity as the soreness in your incision resolves. ° °Bathing/Showering ° °You may shower after you go home. Keep your incision dry for 48 hours. Do not soak in a bathtub, hot tub, or swim until the incision heals completely. You may not shower if you have a hemodialysis catheter. ° °Incision Care ° °Clean your incision with mild soap and water after 48 hours. Pat the area dry with a clean towel. You do not need a bandage unless otherwise instructed. Do not apply any ointments or creams to your incision. You may have skin glue on your incision. Do not peel it off. It will come off on its own in about one week. Your arm may swell a bit after surgery. To reduce swelling use pillows to elevate your arm so it is above your heart. Your doctor will tell you if you need to lightly wrap your arm with an ACE bandage. ° °Diet ° °Resume your normal diet. There are not special food restrictions following this procedure. In order to heal from your surgery, it is CRITICAL to get adequate nutrition. Your body requires vitamins, minerals, and protein. Vegetables are the best source of vitamins and minerals. Vegetables also provide the perfect balance of protein. Processed food has little nutritional value, so try to avoid this. ° °Medications ° °Resume taking all of your medications. If your incision is causing pain, you may take over-the counter pain relievers such as acetaminophen (Tylenol). If you were prescribed a stronger pain medication, please be aware these medications can cause nausea and constipation. Prevent  nausea by taking the medication with a snack or meal. Avoid constipation by drinking plenty of fluids and eating foods with high amount of fiber, such as fruits, vegetables, and grains. Do not take Tylenol if you are taking prescription pain medications. ° ° ° ° °Follow up °Your surgeon may want to see you in the office following your access surgery. If so, this will be arranged at the time of your surgery. ° °Please call us immediately for any of the following conditions: ° °Increased pain, redness, drainage (pus) from your incision site °Fever of 101 degrees or higher °Severe or worsening pain at your incision site °Hand pain or numbness. ° °Reduce your risk of vascular disease: ° °Stop smoking. If you would like help, call [facility] at 1-800-QUIT-NOW (1-800-784-8669) or [facility] at 336-586-4000 ° °Manage your cholesterol °Maintain a desired weight °Control your diabetes °Keep your blood pressure down ° °Dialysis ° °It will take several weeks to several months for your new dialysis access to be ready for use. Your surgeon will determine when it is OK to use it. Your nephrologist will continue to direct your dialysis. You can continue to use your Permcath until your new access is ready for use. ° °If you have any questions, please call the office at [phone]. ° °

## 2017-10-22 NOTE — Telephone Encounter (Signed)
sch appt lvm 12/01/17 130pm p/o PA s/p R BC AVF placement per stf msg

## 2017-10-22 NOTE — Transfer of Care (Signed)
Immediate Anesthesia Transfer of Care Note  Patient: Joseph Mcpherson  Procedure(s) Performed: ARTERIOVENOUS (AV) BRACHIOCEPHALIC FISTULA CREATION RIGHT UPPER ARM (Right Arm Upper)  Patient Location: PACU  Anesthesia Type:MAC  Level of Consciousness: sedated  Airway & Oxygen Therapy: Patient Spontanous Breathing and Patient connected to nasal cannula oxygen  Post-op Assessment: Report given to RN, Post -op Vital signs reviewed and stable and Patient moving all extremities  Post vital signs: Reviewed and stable  Last Vitals:  Vitals Value Taken Time  BP 120/55 10/22/2017  8:47 AM  Temp    Pulse 50 10/22/2017  8:47 AM  Resp 11 10/22/2017  8:47 AM  SpO2 100 % 10/22/2017  8:47 AM  Vitals shown include unvalidated device data.  Last Pain:  Vitals:   10/22/17 0604  TempSrc:   PainSc: 0-No pain      Patients Stated Pain Goal: 3 (94/80/16 5537)  Complications: No apparent anesthesia complications

## 2017-10-22 NOTE — Anesthesia Postprocedure Evaluation (Signed)
Anesthesia Post Note  Patient: Abisai Deer  Procedure(s) Performed: ARTERIOVENOUS (AV) BRACHIOCEPHALIC FISTULA CREATION RIGHT UPPER ARM (Right Arm Upper)     Patient location during evaluation: PACU Anesthesia Type: MAC Level of consciousness: awake and alert Pain management: pain level controlled Vital Signs Assessment: post-procedure vital signs reviewed and stable Respiratory status: spontaneous breathing, nonlabored ventilation, respiratory function stable and patient connected to nasal cannula oxygen Cardiovascular status: stable and blood pressure returned to baseline Postop Assessment: no apparent nausea or vomiting Anesthetic complications: no    Last Vitals:  Vitals:   10/22/17 1005 10/22/17 1014  BP:  (!) 153/67  Pulse:  60  Resp:  14  Temp: 36.6 C   SpO2:  99%    Last Pain:  Vitals:   10/22/17 1014  TempSrc:   PainSc: 0-No pain                 Leonel Mccollum DAVID

## 2017-10-22 NOTE — Anesthesia Preprocedure Evaluation (Addendum)
Anesthesia Evaluation  Patient identified by MRN, date of birth, ID band Patient awake    Reviewed: Allergy & Precautions, NPO status , Patient's Chart, lab work & pertinent test results  Airway Mallampati: II  TM Distance: >3 FB Neck ROM: Full    Dental  (+) Teeth Intact, Dental Advisory Given   Pulmonary    Pulmonary exam normal        Cardiovascular hypertension, Pt. on medications Normal cardiovascular exam     Neuro/Psych    GI/Hepatic   Endo/Other  diabetes, Type 2, Insulin Dependent, Oral Hypoglycemic Agents  Renal/GU ESRFRenal disease     Musculoskeletal   Abdominal   Peds  Hematology   Anesthesia Other Findings   Reproductive/Obstetrics                            Anesthesia Physical Anesthesia Plan  ASA: III  Anesthesia Plan: MAC   Post-op Pain Management:    Induction: Intravenous  PONV Risk Score and Plan: 1 and Ondansetron and Treatment may vary due to age or medical condition  Airway Management Planned: Nasal Cannula and Simple Face Mask  Additional Equipment: None  Intra-op Plan:   Post-operative Plan:   Informed Consent: I have reviewed the patients History and Physical, chart, labs and discussed the procedure including the risks, benefits and alternatives for the proposed anesthesia with the patient or authorized representative who has indicated his/her understanding and acceptance.   Dental advisory given  Plan Discussed with: CRNA and Surgeon  Anesthesia Plan Comments:        Anesthesia Quick Evaluation

## 2017-10-22 NOTE — Progress Notes (Signed)
Dr. Ermalene Postin made aware of pt's current BP and showed EKG tracing. Pt did take all of his home BP meds this AM.  Denies any headache, N/V. 212/57-->201/53-->197/59. No new orders given.

## 2017-10-22 NOTE — Interval H&P Note (Signed)
   History and Physical Update  The patient was interviewed and re-examined.  The patient's previous History and Physical has been reviewed and is unchanged from my consult.  There is no change in the plan of care: R BC AVF vs staged BrVT.   Risk, benefits, and alternatives to access surgery were discussed.    The patient is aware the risks include but are not limited to: bleeding, infection, steal syndrome, nerve damage, ischemic monomelic neuropathy, thrombosis, failure to mature, complications related to venous hypertension, need for additional procedures, death and stroke.    The patient agrees to proceed forward with the procedure.   Adele Barthel, MD, FACS [facility] [place] Office: [phone] Pager: [phone]  10/22/2017, 7:05 AM

## 2017-10-22 NOTE — Anesthesia Procedure Notes (Signed)
Procedure Name: MAC Date/Time: 10/22/2017 7:45 AM Performed by: Moshe Salisbury, CRNA Pre-anesthesia Checklist: Patient identified, Emergency Drugs available, Suction available, Patient being monitored and Timeout performed Patient Re-evaluated:Patient Re-evaluated prior to induction Oxygen Delivery Method: Nasal cannula

## 2017-10-22 NOTE — Op Note (Signed)
OPERATIVE NOTE   PROCEDURE: right brachiocephalic arteriovenous fistula placement  PRE-OPERATIVE DIAGNOSIS: chronic kidney disease stage IV  POST-OPERATIVE DIAGNOSIS: same as above   SURGEON: Adele Barthel, MD  ASSISTANT(S): Laurence Slate, PAC   ANESTHESIA: local and MAC  ESTIMATED BLOOD LOSS: 50 cc  FINDING(S): 1.  Cephalic vein: 3 mm, acceptable 2.  Brachial artery: 3 mm, thickened arterial wall 3.  Venous outflow: palpable thrill  4.  Radial flow: palpable radial pulse  SPECIMEN(S):  none  INDICATIONS:   Joseph Mcpherson is a 72 y.o. male who presents with chronic kidney disease stage IV.  The patient is scheduled for right brachiocephalic arteriovenous fistula placement.  The patient is aware the risks include but are not limited to: bleeding, infection, steal syndrome, nerve damage, ischemic monomelic neuropathy, failure to mature, and need for additional procedures.  The patient is aware of the risks of the procedure and elects to proceed forward.   DESCRIPTION: After full informed written consent was obtained from the patient, the patient was brought back to the operating room and placed supine upon the operating table.  Prior to induction, the patient received IV antibiotics.   After obtaining adequate anesthesia, the patient was then prepped and draped in the standard fashion for a right arm access procedure.  I turned my attention first to identifying the patient's cephalic vein and brachial artery.  Using SonoSite guidance, the location of these vessels were marked out on the skin.     At this point, I injected local anesthetic to obtain a field block of the antecubitum.  In total, I injected about 5 mL of 1% lidocaine without epinephrine.  I made a transverse incision at the level of the antecubitum and dissected through the subcutaneous tissue and fascia to gain exposure of the brachial artery.  This was noted to be 3 mm in diameter externally.  This was dissected out  proximally and distally and controlled with vessel loops .  I then dissected out the cephalic vein.  This was noted to be 3 mm in diameter externally.  The distal segment of the vein was ligated with a  2-0 silk, and the vein was transected.  The proximal segment was interrogated with serial dilators.  The vein accepted up to a 3 mm dilator without any difficulty.  I then instilled the heparinized saline into the vein and clamped it.  At this point, I reset my exposure of the brachial artery and placed the artery under tension proximally and distally.  I made an arteriotomy with a #11 blade, and then I extended the arteriotomy with a Potts scissor.  I injected heparinized saline proximal and distal to this arteriotomy.  The vein was then sewn to the artery in an end-to-side configuration with a running stitch of 7-0 Prolene.  Prior to completing this anastomosis, I allowed the vein and artery to backbleed.  There was no evidence of clot from any vessels.  I completed the anastomosis in the usual fashion and then released all vessel loops and clamps.    There was a palpable thrill in the venous outflow, and there was a palpable radial pulse.  At this point, I irrigated out the surgical wound.  There was no further active bleeding.  The subcutaneous tissue was reapproximated with a running stitch of 3-0 Vicryl.  The skin was then reapproximated with a running subcuticular stitch of 4-0 Vicryl.  The skin was then cleaned, dried, and reinforced with Dermabond.  The patient tolerated  this procedure well.    COMPLICATIONS: none  CONDITION: stable   Adele Barthel, MD, [facility] [place] Office: [phone] Pager: [phone]  10/22/2017, 8:31 AM

## 2017-10-23 ENCOUNTER — Encounter (HOSPITAL_COMMUNITY): Payer: Self-pay | Admitting: Vascular Surgery

## 2017-10-31 DIAGNOSIS — R0600 Dyspnea, unspecified: Secondary | ICD-10-CM | POA: Diagnosis not present

## 2017-10-31 DIAGNOSIS — R748 Abnormal levels of other serum enzymes: Secondary | ICD-10-CM | POA: Diagnosis not present

## 2017-10-31 DIAGNOSIS — I21A1 Myocardial infarction type 2: Secondary | ICD-10-CM | POA: Diagnosis not present

## 2017-10-31 DIAGNOSIS — I129 Hypertensive chronic kidney disease with stage 1 through stage 4 chronic kidney disease, or unspecified chronic kidney disease: Secondary | ICD-10-CM | POA: Diagnosis not present

## 2017-10-31 DIAGNOSIS — I509 Heart failure, unspecified: Secondary | ICD-10-CM | POA: Diagnosis not present

## 2017-10-31 DIAGNOSIS — I132 Hypertensive heart and chronic kidney disease with heart failure and with stage 5 chronic kidney disease, or end stage renal disease: Secondary | ICD-10-CM | POA: Diagnosis not present

## 2017-10-31 DIAGNOSIS — R0689 Other abnormalities of breathing: Secondary | ICD-10-CM | POA: Diagnosis not present

## 2017-10-31 DIAGNOSIS — R7989 Other specified abnormal findings of blood chemistry: Secondary | ICD-10-CM | POA: Diagnosis not present

## 2017-10-31 DIAGNOSIS — D729 Disorder of white blood cells, unspecified: Secondary | ICD-10-CM | POA: Diagnosis not present

## 2017-10-31 DIAGNOSIS — E1142 Type 2 diabetes mellitus with diabetic polyneuropathy: Secondary | ICD-10-CM | POA: Diagnosis not present

## 2017-10-31 DIAGNOSIS — N189 Chronic kidney disease, unspecified: Secondary | ICD-10-CM | POA: Diagnosis not present

## 2017-10-31 DIAGNOSIS — N185 Chronic kidney disease, stage 5: Secondary | ICD-10-CM | POA: Diagnosis not present

## 2017-10-31 DIAGNOSIS — R0602 Shortness of breath: Secondary | ICD-10-CM | POA: Diagnosis not present

## 2017-10-31 DIAGNOSIS — I5033 Acute on chronic diastolic (congestive) heart failure: Secondary | ICD-10-CM | POA: Diagnosis not present

## 2017-10-31 DIAGNOSIS — E1121 Type 2 diabetes mellitus with diabetic nephropathy: Secondary | ICD-10-CM | POA: Diagnosis not present

## 2017-10-31 DIAGNOSIS — J9601 Acute respiratory failure with hypoxia: Secondary | ICD-10-CM | POA: Diagnosis not present

## 2017-10-31 DIAGNOSIS — E1122 Type 2 diabetes mellitus with diabetic chronic kidney disease: Secondary | ICD-10-CM | POA: Diagnosis not present

## 2017-10-31 DIAGNOSIS — J984 Other disorders of lung: Secondary | ICD-10-CM | POA: Diagnosis not present

## 2017-10-31 DIAGNOSIS — I519 Heart disease, unspecified: Secondary | ICD-10-CM | POA: Diagnosis not present

## 2017-10-31 DIAGNOSIS — D631 Anemia in chronic kidney disease: Secondary | ICD-10-CM | POA: Diagnosis not present

## 2017-10-31 DIAGNOSIS — J156 Pneumonia due to other aerobic Gram-negative bacteria: Secondary | ICD-10-CM | POA: Diagnosis not present

## 2017-10-31 DIAGNOSIS — J90 Pleural effusion, not elsewhere classified: Secondary | ICD-10-CM | POA: Diagnosis not present

## 2017-10-31 DIAGNOSIS — J811 Chronic pulmonary edema: Secondary | ICD-10-CM | POA: Diagnosis not present

## 2017-10-31 DIAGNOSIS — D649 Anemia, unspecified: Secondary | ICD-10-CM | POA: Diagnosis not present

## 2017-10-31 DIAGNOSIS — E872 Acidosis: Secondary | ICD-10-CM | POA: Diagnosis not present

## 2017-10-31 DIAGNOSIS — R918 Other nonspecific abnormal finding of lung field: Secondary | ICD-10-CM | POA: Diagnosis not present

## 2017-10-31 DIAGNOSIS — J189 Pneumonia, unspecified organism: Secondary | ICD-10-CM | POA: Diagnosis not present

## 2017-10-31 DIAGNOSIS — R0609 Other forms of dyspnea: Secondary | ICD-10-CM | POA: Diagnosis not present

## 2017-10-31 DIAGNOSIS — I10 Essential (primary) hypertension: Secondary | ICD-10-CM | POA: Diagnosis not present

## 2017-10-31 DIAGNOSIS — J9600 Acute respiratory failure, unspecified whether with hypoxia or hypercapnia: Secondary | ICD-10-CM | POA: Diagnosis not present

## 2017-10-31 DIAGNOSIS — Z9889 Other specified postprocedural states: Secondary | ICD-10-CM | POA: Diagnosis not present

## 2017-10-31 DIAGNOSIS — I340 Nonrheumatic mitral (valve) insufficiency: Secondary | ICD-10-CM | POA: Diagnosis not present

## 2017-10-31 DIAGNOSIS — I361 Nonrheumatic tricuspid (valve) insufficiency: Secondary | ICD-10-CM | POA: Diagnosis not present

## 2017-11-10 DIAGNOSIS — I120 Hypertensive chronic kidney disease with stage 5 chronic kidney disease or end stage renal disease: Secondary | ICD-10-CM | POA: Diagnosis not present

## 2017-11-10 DIAGNOSIS — D631 Anemia in chronic kidney disease: Secondary | ICD-10-CM | POA: Diagnosis not present

## 2017-11-10 DIAGNOSIS — N2581 Secondary hyperparathyroidism of renal origin: Secondary | ICD-10-CM | POA: Diagnosis not present

## 2017-11-10 DIAGNOSIS — N185 Chronic kidney disease, stage 5: Secondary | ICD-10-CM | POA: Diagnosis not present

## 2017-12-01 ENCOUNTER — Other Ambulatory Visit: Payer: Self-pay

## 2017-12-01 ENCOUNTER — Ambulatory Visit (INDEPENDENT_AMBULATORY_CARE_PROVIDER_SITE_OTHER): Payer: Self-pay | Admitting: Physician Assistant

## 2017-12-01 VITALS — BP 155/66 | HR 71 | Temp 100.0°F | Resp 14 | Ht 64.0 in | Wt 145.0 lb

## 2017-12-01 DIAGNOSIS — N390 Urinary tract infection, site not specified: Secondary | ICD-10-CM | POA: Diagnosis not present

## 2017-12-01 DIAGNOSIS — N185 Chronic kidney disease, stage 5: Secondary | ICD-10-CM

## 2017-12-01 DIAGNOSIS — D631 Anemia in chronic kidney disease: Secondary | ICD-10-CM | POA: Diagnosis not present

## 2017-12-01 NOTE — Progress Notes (Signed)
    Postoperative Access Visit   History of Present Illness   Joseph Mcpherson is a 72 y.o. year old male who presents for postoperative follow-up for: right brachiocephalic arteriovenous fistula by Dr. Bridgett Larsson (Date: 10/22/17).  The patient's wounds are healed.  The patient denies steal symptoms.  The patient is  able to complete their activities of daily living.  He is not yet on hemodialysis however is complaining of feeling the urge to pee however only urinating very little when he tries to go to the bathroom.  He also has had some BLE edema and lack of appetite.  He denies SOB, confusion, or other uremic symptoms.     Physical Examination   Vitals:   12/01/17 1325 12/01/17 1329  BP: (!) 166/65 (!) 155/66  Pulse: 71 71  Resp: 14   Temp: 100 F (37.8 C)   TempSrc: Oral   SpO2: 100%   Weight: 145 lb (65.8 kg)   Height: 5\' 4"  (1.626 m)    Body mass index is 24.89 kg/m.  right arm Incision is healed, hand grip is 5/5, sensation in digits is intact, palpable thrill, bruit can be auscultated   Ultrasound performed at bedside and demonstrates a patent R arm brachiocephalic fistula ranging in diameter from 0.48cm near axilla to 0.53cm in distal upper arm.  Fistula is less than 0.6cm throughout length of upper arm    Medical Decision Making   Joseph Mcpherson is a 72 y.o. year old male who presents s/p right brachiocephalic arteriovenous fistula   The patient's access will be ready for use at the 12 week mark (01/14/18)  I recommended patient and wife to call Nephrologist to discuss decrease urine output and swelling BLE and they agreed to do so; I do not believe that the patient is uremic based on our office visit today  If patient is started on dialysis before 12 week mark he will need Lynn placed   Dagoberto Ligas PA-C [facility] [place] Office: [phone]

## 2017-12-01 NOTE — Progress Notes (Signed)
Vitals:   12/01/17 1325  BP: (!) 166/65  Pulse: 71  Resp: 14  Temp: 100 F (37.8 C)  TempSrc: Oral  SpO2: 100%  Weight: 145 lb (65.8 kg)  Height: 5\' 4"  (1.626 m)

## 2017-12-02 ENCOUNTER — Other Ambulatory Visit: Payer: Self-pay | Admitting: Cardiovascular Disease

## 2017-12-13 ENCOUNTER — Encounter (INDEPENDENT_AMBULATORY_CARE_PROVIDER_SITE_OTHER): Payer: Medicare HMO

## 2017-12-13 ENCOUNTER — Ambulatory Visit (INDEPENDENT_AMBULATORY_CARE_PROVIDER_SITE_OTHER): Payer: Medicare HMO | Admitting: Vascular Surgery

## 2017-12-14 DIAGNOSIS — R0602 Shortness of breath: Secondary | ICD-10-CM | POA: Diagnosis not present

## 2017-12-14 DIAGNOSIS — E8881 Metabolic syndrome: Secondary | ICD-10-CM | POA: Diagnosis not present

## 2017-12-14 DIAGNOSIS — Z0000 Encounter for general adult medical examination without abnormal findings: Secondary | ICD-10-CM | POA: Diagnosis not present

## 2017-12-14 DIAGNOSIS — R0989 Other specified symptoms and signs involving the circulatory and respiratory systems: Secondary | ICD-10-CM | POA: Diagnosis not present

## 2017-12-14 DIAGNOSIS — R413 Other amnesia: Secondary | ICD-10-CM | POA: Diagnosis not present

## 2017-12-20 DIAGNOSIS — R0602 Shortness of breath: Secondary | ICD-10-CM | POA: Diagnosis not present

## 2017-12-20 DIAGNOSIS — R413 Other amnesia: Secondary | ICD-10-CM | POA: Diagnosis not present

## 2017-12-20 DIAGNOSIS — M159 Polyosteoarthritis, unspecified: Secondary | ICD-10-CM | POA: Diagnosis not present

## 2017-12-20 DIAGNOSIS — R0989 Other specified symptoms and signs involving the circulatory and respiratory systems: Secondary | ICD-10-CM | POA: Diagnosis not present

## 2017-12-22 ENCOUNTER — Ambulatory Visit
Admission: RE | Admit: 2017-12-22 | Discharge: 2017-12-22 | Disposition: A | Discharge status: Home / Self Care | Payer: Medicare HMO | Source: Ambulatory Visit | Attending: Internal Medicine | Admitting: Internal Medicine

## 2017-12-22 ENCOUNTER — Encounter (HOSPITAL_COMMUNITY): Payer: Medicare HMO

## 2017-12-22 ENCOUNTER — Ambulatory Visit: Payer: Medicare HMO

## 2017-12-22 DIAGNOSIS — Z0000 Encounter for general adult medical examination without abnormal findings: Secondary | ICD-10-CM | POA: Diagnosis not present

## 2017-12-22 DIAGNOSIS — E11319 Type 2 diabetes mellitus with unspecified diabetic retinopathy without macular edema: Secondary | ICD-10-CM | POA: Diagnosis not present

## 2017-12-22 DIAGNOSIS — R413 Other amnesia: Secondary | ICD-10-CM | POA: Diagnosis not present

## 2017-12-22 DIAGNOSIS — E669 Obesity, unspecified: Secondary | ICD-10-CM | POA: Diagnosis not present

## 2017-12-22 DIAGNOSIS — H35049 Retinal micro-aneurysms, unspecified, unspecified eye: Secondary | ICD-10-CM | POA: Diagnosis not present

## 2017-12-22 MED ORDER — SODIUM CHLORIDE FLUSH 0.9 % IV SOLN
INTRAVENOUS | Status: AC
  Filled 2017-12-22: qty 10

## 2017-12-22 MED ORDER — SODIUM CHLORIDE 0.9 % IV SOLN
510.0000 mg | Freq: Once | INTRAVENOUS | Status: DC
  Filled 2017-12-22: qty 17

## 2017-12-22 MED ORDER — PENTAFLUOROPROP-TETRAFLUOROETH EX AERO
INHALATION_SPRAY | CUTANEOUS | Status: DC
Start: 2017-12-22 — End: 2017-12-23
  Filled 2017-12-22: qty 103.5

## 2017-12-22 NOTE — Progress Notes (Signed)
Unable to obtain IV access on patient. Stacy at Dr Serita Grit office notified. Patient is going out of town tomorrow 12/23/17 and will return 01/11/18. Marzetta Board will speak with Dr Posey Pronto and inform patient of next action. Patient notified of this plan.

## 2017-12-23 ENCOUNTER — Ambulatory Visit: Payer: Medicare HMO

## 2018-01-07 ENCOUNTER — Other Ambulatory Visit: Payer: Self-pay | Admitting: Cardiovascular Disease

## 2018-01-19 ENCOUNTER — Other Ambulatory Visit: Payer: Self-pay | Admitting: Internal Medicine

## 2018-01-19 DIAGNOSIS — R69 Illness, unspecified: Secondary | ICD-10-CM | POA: Diagnosis not present

## 2018-01-19 DIAGNOSIS — E1140 Type 2 diabetes mellitus with diabetic neuropathy, unspecified: Secondary | ICD-10-CM | POA: Diagnosis not present

## 2018-01-19 DIAGNOSIS — R0602 Shortness of breath: Secondary | ICD-10-CM

## 2018-01-19 DIAGNOSIS — M159 Polyosteoarthritis, unspecified: Secondary | ICD-10-CM | POA: Diagnosis not present

## 2018-01-19 DIAGNOSIS — H35049 Retinal micro-aneurysms, unspecified, unspecified eye: Secondary | ICD-10-CM | POA: Diagnosis not present

## 2018-01-19 DIAGNOSIS — E11319 Type 2 diabetes mellitus with unspecified diabetic retinopathy without macular edema: Secondary | ICD-10-CM | POA: Diagnosis not present

## 2018-01-19 DIAGNOSIS — E8881 Metabolic syndrome: Secondary | ICD-10-CM | POA: Diagnosis not present

## 2018-01-19 DIAGNOSIS — E669 Obesity, unspecified: Secondary | ICD-10-CM | POA: Diagnosis not present

## 2018-01-21 ENCOUNTER — Ambulatory Visit
Admission: RE | Admit: 2018-01-21 | Discharge: 2018-01-21 | Disposition: A | Discharge status: Home / Self Care | Payer: Medicare HMO | Source: Ambulatory Visit | Attending: Internal Medicine | Admitting: Internal Medicine

## 2018-01-21 DIAGNOSIS — J9811 Atelectasis: Secondary | ICD-10-CM | POA: Insufficient documentation

## 2018-01-21 DIAGNOSIS — E119 Type 2 diabetes mellitus without complications: Secondary | ICD-10-CM | POA: Insufficient documentation

## 2018-01-21 DIAGNOSIS — I10 Essential (primary) hypertension: Secondary | ICD-10-CM | POA: Insufficient documentation

## 2018-01-21 DIAGNOSIS — J90 Pleural effusion, not elsewhere classified: Secondary | ICD-10-CM | POA: Insufficient documentation

## 2018-01-21 DIAGNOSIS — R0602 Shortness of breath: Secondary | ICD-10-CM

## 2018-01-26 DIAGNOSIS — D631 Anemia in chronic kidney disease: Secondary | ICD-10-CM | POA: Diagnosis not present

## 2018-01-26 DIAGNOSIS — N185 Chronic kidney disease, stage 5: Secondary | ICD-10-CM | POA: Diagnosis not present

## 2018-02-03 ENCOUNTER — Other Ambulatory Visit: Payer: Self-pay | Admitting: Cardiovascular Disease

## 2018-02-03 DIAGNOSIS — N185 Chronic kidney disease, stage 5: Secondary | ICD-10-CM | POA: Diagnosis not present

## 2018-02-04 ENCOUNTER — Other Ambulatory Visit: Payer: Self-pay | Admitting: Cardiovascular Disease

## 2018-02-04 ENCOUNTER — Telehealth: Payer: Self-pay | Admitting: Cardiovascular Disease

## 2018-02-04 DIAGNOSIS — R69 Illness, unspecified: Secondary | ICD-10-CM | POA: Diagnosis not present

## 2018-02-04 NOTE — Telephone Encounter (Signed)
-----   Message from Anselm Pancoast, [place] sent at 02/04/2018  3:50 PM EDT ----- Please contact patient for a follow up. The patient was told to follow up as needed but we need to see at least once a year to refill meds.  Thanks, Ivin Booty

## 2018-02-04 NOTE — Telephone Encounter (Signed)
No ans no vm   °

## 2018-02-07 DIAGNOSIS — I120 Hypertensive chronic kidney disease with stage 5 chronic kidney disease or end stage renal disease: Secondary | ICD-10-CM | POA: Diagnosis not present

## 2018-02-07 DIAGNOSIS — N185 Chronic kidney disease, stage 5: Secondary | ICD-10-CM | POA: Diagnosis not present

## 2018-02-07 DIAGNOSIS — D631 Anemia in chronic kidney disease: Secondary | ICD-10-CM | POA: Diagnosis not present

## 2018-02-07 DIAGNOSIS — N2581 Secondary hyperparathyroidism of renal origin: Secondary | ICD-10-CM | POA: Diagnosis not present

## 2018-02-08 NOTE — Telephone Encounter (Signed)
No ans no vm   °

## 2018-02-09 DIAGNOSIS — L299 Pruritus, unspecified: Secondary | ICD-10-CM | POA: Insufficient documentation

## 2018-02-09 DIAGNOSIS — D631 Anemia in chronic kidney disease: Secondary | ICD-10-CM | POA: Insufficient documentation

## 2018-02-09 DIAGNOSIS — N189 Chronic kidney disease, unspecified: Secondary | ICD-10-CM | POA: Insufficient documentation

## 2018-02-09 DIAGNOSIS — N2581 Secondary hyperparathyroidism of renal origin: Secondary | ICD-10-CM | POA: Insufficient documentation

## 2018-02-09 DIAGNOSIS — D509 Iron deficiency anemia, unspecified: Secondary | ICD-10-CM | POA: Insufficient documentation

## 2018-02-09 DIAGNOSIS — N186 End stage renal disease: Secondary | ICD-10-CM | POA: Insufficient documentation

## 2018-02-11 NOTE — Telephone Encounter (Signed)
Patient asked for a call back Monday  Will call him than

## 2018-02-12 DIAGNOSIS — N186 End stage renal disease: Secondary | ICD-10-CM | POA: Diagnosis not present

## 2018-02-12 DIAGNOSIS — N2581 Secondary hyperparathyroidism of renal origin: Secondary | ICD-10-CM | POA: Diagnosis not present

## 2018-02-12 DIAGNOSIS — D689 Coagulation defect, unspecified: Secondary | ICD-10-CM | POA: Diagnosis not present

## 2018-02-15 DIAGNOSIS — D689 Coagulation defect, unspecified: Secondary | ICD-10-CM | POA: Diagnosis not present

## 2018-02-15 DIAGNOSIS — N2581 Secondary hyperparathyroidism of renal origin: Secondary | ICD-10-CM | POA: Diagnosis not present

## 2018-02-15 DIAGNOSIS — N186 End stage renal disease: Secondary | ICD-10-CM | POA: Diagnosis not present

## 2018-02-17 DIAGNOSIS — N2581 Secondary hyperparathyroidism of renal origin: Secondary | ICD-10-CM | POA: Diagnosis not present

## 2018-02-17 DIAGNOSIS — N186 End stage renal disease: Secondary | ICD-10-CM | POA: Diagnosis not present

## 2018-02-17 DIAGNOSIS — D689 Coagulation defect, unspecified: Secondary | ICD-10-CM | POA: Diagnosis not present

## 2018-02-19 ENCOUNTER — Other Ambulatory Visit: Payer: Self-pay | Admitting: Nephrology

## 2018-02-19 ENCOUNTER — Other Ambulatory Visit
Admission: RE | Admit: 2018-02-19 | Discharge: 2018-02-19 | Disposition: A | Discharge status: Home / Self Care | Payer: Medicare HMO | Source: Ambulatory Visit | Attending: Nephrology | Admitting: Nephrology

## 2018-02-19 DIAGNOSIS — E875 Hyperkalemia: Secondary | ICD-10-CM | POA: Diagnosis not present

## 2018-02-19 DIAGNOSIS — N2581 Secondary hyperparathyroidism of renal origin: Secondary | ICD-10-CM | POA: Diagnosis not present

## 2018-02-19 DIAGNOSIS — D689 Coagulation defect, unspecified: Secondary | ICD-10-CM | POA: Diagnosis not present

## 2018-02-19 DIAGNOSIS — N186 End stage renal disease: Secondary | ICD-10-CM | POA: Diagnosis not present

## 2018-02-19 LAB — BASIC METABOLIC PANEL
Anion gap: 8 (ref 5–15)
BUN: 30 mg/dL — ABNORMAL HIGH (ref 8–23)
CO2: 27 mmol/L (ref 22–32)
Calcium: 8.2 mg/dL — ABNORMAL LOW (ref 8.9–10.3)
Chloride: 106 mmol/L (ref 98–111)
Creatinine, Ser: 7.05 mg/dL — ABNORMAL HIGH (ref 0.61–1.24)
GFR calc Af Amer: 8 mL/min — ABNORMAL LOW (ref >60)
GFR calc non Af Amer: 7 mL/min — ABNORMAL LOW (ref >60)
Glucose, Bld: 133 mg/dL — ABNORMAL HIGH (ref 70–99)
Potassium: 4.6 mmol/L (ref 3.5–5.1)
Sodium: 141 mmol/L (ref 135–145)

## 2018-02-21 DIAGNOSIS — T82898A Other specified complication of vascular prosthetic devices, implants and grafts, initial encounter: Secondary | ICD-10-CM | POA: Diagnosis not present

## 2018-02-21 DIAGNOSIS — E1122 Type 2 diabetes mellitus with diabetic chronic kidney disease: Secondary | ICD-10-CM | POA: Diagnosis not present

## 2018-02-21 DIAGNOSIS — Z992 Dependence on renal dialysis: Secondary | ICD-10-CM | POA: Diagnosis not present

## 2018-02-21 DIAGNOSIS — N186 End stage renal disease: Secondary | ICD-10-CM | POA: Diagnosis not present

## 2018-02-22 DIAGNOSIS — D689 Coagulation defect, unspecified: Secondary | ICD-10-CM | POA: Diagnosis not present

## 2018-02-22 DIAGNOSIS — D631 Anemia in chronic kidney disease: Secondary | ICD-10-CM | POA: Diagnosis not present

## 2018-02-22 DIAGNOSIS — E1129 Type 2 diabetes mellitus with other diabetic kidney complication: Secondary | ICD-10-CM | POA: Diagnosis not present

## 2018-02-22 DIAGNOSIS — N2581 Secondary hyperparathyroidism of renal origin: Secondary | ICD-10-CM | POA: Diagnosis not present

## 2018-02-22 DIAGNOSIS — N186 End stage renal disease: Secondary | ICD-10-CM | POA: Diagnosis not present

## 2018-02-22 DIAGNOSIS — D509 Iron deficiency anemia, unspecified: Secondary | ICD-10-CM | POA: Diagnosis not present

## 2018-02-22 DIAGNOSIS — E1140 Type 2 diabetes mellitus with diabetic neuropathy, unspecified: Secondary | ICD-10-CM | POA: Diagnosis not present

## 2018-02-22 NOTE — Telephone Encounter (Signed)
No answer no vm

## 2018-02-24 DIAGNOSIS — D509 Iron deficiency anemia, unspecified: Secondary | ICD-10-CM | POA: Diagnosis not present

## 2018-02-24 DIAGNOSIS — D689 Coagulation defect, unspecified: Secondary | ICD-10-CM | POA: Diagnosis not present

## 2018-02-24 DIAGNOSIS — E1140 Type 2 diabetes mellitus with diabetic neuropathy, unspecified: Secondary | ICD-10-CM | POA: Diagnosis not present

## 2018-02-24 DIAGNOSIS — D631 Anemia in chronic kidney disease: Secondary | ICD-10-CM | POA: Diagnosis not present

## 2018-02-24 DIAGNOSIS — N2581 Secondary hyperparathyroidism of renal origin: Secondary | ICD-10-CM | POA: Diagnosis not present

## 2018-02-24 DIAGNOSIS — N186 End stage renal disease: Secondary | ICD-10-CM | POA: Diagnosis not present

## 2018-02-24 DIAGNOSIS — E1129 Type 2 diabetes mellitus with other diabetic kidney complication: Secondary | ICD-10-CM | POA: Diagnosis not present

## 2018-02-26 DIAGNOSIS — N186 End stage renal disease: Secondary | ICD-10-CM | POA: Diagnosis not present

## 2018-02-26 DIAGNOSIS — D689 Coagulation defect, unspecified: Secondary | ICD-10-CM | POA: Diagnosis not present

## 2018-02-26 DIAGNOSIS — D509 Iron deficiency anemia, unspecified: Secondary | ICD-10-CM | POA: Diagnosis not present

## 2018-02-26 DIAGNOSIS — D631 Anemia in chronic kidney disease: Secondary | ICD-10-CM | POA: Diagnosis not present

## 2018-02-26 DIAGNOSIS — E1129 Type 2 diabetes mellitus with other diabetic kidney complication: Secondary | ICD-10-CM | POA: Diagnosis not present

## 2018-02-26 DIAGNOSIS — N2581 Secondary hyperparathyroidism of renal origin: Secondary | ICD-10-CM | POA: Diagnosis not present

## 2018-02-26 DIAGNOSIS — E1140 Type 2 diabetes mellitus with diabetic neuropathy, unspecified: Secondary | ICD-10-CM | POA: Diagnosis not present

## 2018-02-28 DIAGNOSIS — D689 Coagulation defect, unspecified: Secondary | ICD-10-CM | POA: Diagnosis not present

## 2018-02-28 DIAGNOSIS — D509 Iron deficiency anemia, unspecified: Secondary | ICD-10-CM | POA: Diagnosis not present

## 2018-02-28 DIAGNOSIS — N186 End stage renal disease: Secondary | ICD-10-CM | POA: Diagnosis not present

## 2018-02-28 DIAGNOSIS — D631 Anemia in chronic kidney disease: Secondary | ICD-10-CM | POA: Diagnosis not present

## 2018-02-28 DIAGNOSIS — N2581 Secondary hyperparathyroidism of renal origin: Secondary | ICD-10-CM | POA: Diagnosis not present

## 2018-02-28 DIAGNOSIS — E1140 Type 2 diabetes mellitus with diabetic neuropathy, unspecified: Secondary | ICD-10-CM | POA: Diagnosis not present

## 2018-02-28 DIAGNOSIS — E1129 Type 2 diabetes mellitus with other diabetic kidney complication: Secondary | ICD-10-CM | POA: Diagnosis not present

## 2018-03-01 ENCOUNTER — Encounter: Payer: Self-pay | Admitting: Cardiovascular Disease

## 2018-03-01 NOTE — Telephone Encounter (Signed)
Sent letter

## 2018-03-02 DIAGNOSIS — D689 Coagulation defect, unspecified: Secondary | ICD-10-CM | POA: Diagnosis not present

## 2018-03-02 DIAGNOSIS — N2581 Secondary hyperparathyroidism of renal origin: Secondary | ICD-10-CM | POA: Diagnosis not present

## 2018-03-02 DIAGNOSIS — D631 Anemia in chronic kidney disease: Secondary | ICD-10-CM | POA: Diagnosis not present

## 2018-03-02 DIAGNOSIS — N186 End stage renal disease: Secondary | ICD-10-CM | POA: Diagnosis not present

## 2018-03-02 DIAGNOSIS — E1129 Type 2 diabetes mellitus with other diabetic kidney complication: Secondary | ICD-10-CM | POA: Diagnosis not present

## 2018-03-02 DIAGNOSIS — E1140 Type 2 diabetes mellitus with diabetic neuropathy, unspecified: Secondary | ICD-10-CM | POA: Diagnosis not present

## 2018-03-02 DIAGNOSIS — D509 Iron deficiency anemia, unspecified: Secondary | ICD-10-CM | POA: Diagnosis not present

## 2018-03-04 DIAGNOSIS — D509 Iron deficiency anemia, unspecified: Secondary | ICD-10-CM | POA: Diagnosis not present

## 2018-03-04 DIAGNOSIS — N2581 Secondary hyperparathyroidism of renal origin: Secondary | ICD-10-CM | POA: Diagnosis not present

## 2018-03-04 DIAGNOSIS — D631 Anemia in chronic kidney disease: Secondary | ICD-10-CM | POA: Diagnosis not present

## 2018-03-04 DIAGNOSIS — E1129 Type 2 diabetes mellitus with other diabetic kidney complication: Secondary | ICD-10-CM | POA: Diagnosis not present

## 2018-03-04 DIAGNOSIS — E1140 Type 2 diabetes mellitus with diabetic neuropathy, unspecified: Secondary | ICD-10-CM | POA: Diagnosis not present

## 2018-03-04 DIAGNOSIS — N186 End stage renal disease: Secondary | ICD-10-CM | POA: Diagnosis not present

## 2018-03-04 DIAGNOSIS — D689 Coagulation defect, unspecified: Secondary | ICD-10-CM | POA: Diagnosis not present

## 2018-03-07 DIAGNOSIS — D631 Anemia in chronic kidney disease: Secondary | ICD-10-CM | POA: Diagnosis not present

## 2018-03-07 DIAGNOSIS — T82858A Stenosis of vascular prosthetic devices, implants and grafts, initial encounter: Secondary | ICD-10-CM | POA: Diagnosis not present

## 2018-03-07 DIAGNOSIS — E1129 Type 2 diabetes mellitus with other diabetic kidney complication: Secondary | ICD-10-CM | POA: Diagnosis not present

## 2018-03-07 DIAGNOSIS — E1140 Type 2 diabetes mellitus with diabetic neuropathy, unspecified: Secondary | ICD-10-CM | POA: Diagnosis not present

## 2018-03-07 DIAGNOSIS — Z992 Dependence on renal dialysis: Secondary | ICD-10-CM | POA: Diagnosis not present

## 2018-03-07 DIAGNOSIS — D509 Iron deficiency anemia, unspecified: Secondary | ICD-10-CM | POA: Diagnosis not present

## 2018-03-07 DIAGNOSIS — I871 Compression of vein: Secondary | ICD-10-CM | POA: Diagnosis not present

## 2018-03-07 DIAGNOSIS — N186 End stage renal disease: Secondary | ICD-10-CM | POA: Diagnosis not present

## 2018-03-07 DIAGNOSIS — N2581 Secondary hyperparathyroidism of renal origin: Secondary | ICD-10-CM | POA: Diagnosis not present

## 2018-03-07 DIAGNOSIS — D689 Coagulation defect, unspecified: Secondary | ICD-10-CM | POA: Diagnosis not present

## 2018-03-09 DIAGNOSIS — E1129 Type 2 diabetes mellitus with other diabetic kidney complication: Secondary | ICD-10-CM | POA: Diagnosis not present

## 2018-03-09 DIAGNOSIS — N186 End stage renal disease: Secondary | ICD-10-CM | POA: Diagnosis not present

## 2018-03-09 DIAGNOSIS — D509 Iron deficiency anemia, unspecified: Secondary | ICD-10-CM | POA: Diagnosis not present

## 2018-03-09 DIAGNOSIS — D689 Coagulation defect, unspecified: Secondary | ICD-10-CM | POA: Diagnosis not present

## 2018-03-09 DIAGNOSIS — N2581 Secondary hyperparathyroidism of renal origin: Secondary | ICD-10-CM | POA: Diagnosis not present

## 2018-03-09 DIAGNOSIS — E1140 Type 2 diabetes mellitus with diabetic neuropathy, unspecified: Secondary | ICD-10-CM | POA: Diagnosis not present

## 2018-03-09 DIAGNOSIS — D631 Anemia in chronic kidney disease: Secondary | ICD-10-CM | POA: Diagnosis not present

## 2018-03-11 DIAGNOSIS — D631 Anemia in chronic kidney disease: Secondary | ICD-10-CM | POA: Diagnosis not present

## 2018-03-11 DIAGNOSIS — D509 Iron deficiency anemia, unspecified: Secondary | ICD-10-CM | POA: Diagnosis not present

## 2018-03-11 DIAGNOSIS — N186 End stage renal disease: Secondary | ICD-10-CM | POA: Diagnosis not present

## 2018-03-11 DIAGNOSIS — E1140 Type 2 diabetes mellitus with diabetic neuropathy, unspecified: Secondary | ICD-10-CM | POA: Diagnosis not present

## 2018-03-11 DIAGNOSIS — D689 Coagulation defect, unspecified: Secondary | ICD-10-CM | POA: Diagnosis not present

## 2018-03-11 DIAGNOSIS — E1129 Type 2 diabetes mellitus with other diabetic kidney complication: Secondary | ICD-10-CM | POA: Diagnosis not present

## 2018-03-11 DIAGNOSIS — N2581 Secondary hyperparathyroidism of renal origin: Secondary | ICD-10-CM | POA: Diagnosis not present

## 2018-03-14 DIAGNOSIS — N2581 Secondary hyperparathyroidism of renal origin: Secondary | ICD-10-CM | POA: Diagnosis not present

## 2018-03-14 DIAGNOSIS — I871 Compression of vein: Secondary | ICD-10-CM | POA: Diagnosis not present

## 2018-03-14 DIAGNOSIS — D509 Iron deficiency anemia, unspecified: Secondary | ICD-10-CM | POA: Diagnosis not present

## 2018-03-14 DIAGNOSIS — E1129 Type 2 diabetes mellitus with other diabetic kidney complication: Secondary | ICD-10-CM | POA: Diagnosis not present

## 2018-03-14 DIAGNOSIS — Z992 Dependence on renal dialysis: Secondary | ICD-10-CM | POA: Diagnosis not present

## 2018-03-14 DIAGNOSIS — N186 End stage renal disease: Secondary | ICD-10-CM | POA: Diagnosis not present

## 2018-03-14 DIAGNOSIS — D689 Coagulation defect, unspecified: Secondary | ICD-10-CM | POA: Diagnosis not present

## 2018-03-14 DIAGNOSIS — T82858A Stenosis of vascular prosthetic devices, implants and grafts, initial encounter: Secondary | ICD-10-CM | POA: Diagnosis not present

## 2018-03-14 DIAGNOSIS — E1140 Type 2 diabetes mellitus with diabetic neuropathy, unspecified: Secondary | ICD-10-CM | POA: Diagnosis not present

## 2018-03-14 DIAGNOSIS — D631 Anemia in chronic kidney disease: Secondary | ICD-10-CM | POA: Diagnosis not present

## 2018-03-16 DIAGNOSIS — D631 Anemia in chronic kidney disease: Secondary | ICD-10-CM | POA: Diagnosis not present

## 2018-03-16 DIAGNOSIS — N186 End stage renal disease: Secondary | ICD-10-CM | POA: Diagnosis not present

## 2018-03-16 DIAGNOSIS — N2581 Secondary hyperparathyroidism of renal origin: Secondary | ICD-10-CM | POA: Diagnosis not present

## 2018-03-16 DIAGNOSIS — E1129 Type 2 diabetes mellitus with other diabetic kidney complication: Secondary | ICD-10-CM | POA: Diagnosis not present

## 2018-03-16 DIAGNOSIS — D509 Iron deficiency anemia, unspecified: Secondary | ICD-10-CM | POA: Diagnosis not present

## 2018-03-16 DIAGNOSIS — E1140 Type 2 diabetes mellitus with diabetic neuropathy, unspecified: Secondary | ICD-10-CM | POA: Diagnosis not present

## 2018-03-16 DIAGNOSIS — D689 Coagulation defect, unspecified: Secondary | ICD-10-CM | POA: Diagnosis not present

## 2018-03-18 DIAGNOSIS — E1140 Type 2 diabetes mellitus with diabetic neuropathy, unspecified: Secondary | ICD-10-CM | POA: Diagnosis not present

## 2018-03-18 DIAGNOSIS — D631 Anemia in chronic kidney disease: Secondary | ICD-10-CM | POA: Diagnosis not present

## 2018-03-18 DIAGNOSIS — N2581 Secondary hyperparathyroidism of renal origin: Secondary | ICD-10-CM | POA: Diagnosis not present

## 2018-03-18 DIAGNOSIS — N186 End stage renal disease: Secondary | ICD-10-CM | POA: Diagnosis not present

## 2018-03-18 DIAGNOSIS — E1129 Type 2 diabetes mellitus with other diabetic kidney complication: Secondary | ICD-10-CM | POA: Diagnosis not present

## 2018-03-18 DIAGNOSIS — D689 Coagulation defect, unspecified: Secondary | ICD-10-CM | POA: Diagnosis not present

## 2018-03-18 DIAGNOSIS — D509 Iron deficiency anemia, unspecified: Secondary | ICD-10-CM | POA: Diagnosis not present

## 2018-03-21 DIAGNOSIS — N2581 Secondary hyperparathyroidism of renal origin: Secondary | ICD-10-CM | POA: Diagnosis not present

## 2018-03-21 DIAGNOSIS — N186 End stage renal disease: Secondary | ICD-10-CM | POA: Diagnosis not present

## 2018-03-21 DIAGNOSIS — D509 Iron deficiency anemia, unspecified: Secondary | ICD-10-CM | POA: Diagnosis not present

## 2018-03-21 DIAGNOSIS — E1129 Type 2 diabetes mellitus with other diabetic kidney complication: Secondary | ICD-10-CM | POA: Diagnosis not present

## 2018-03-21 DIAGNOSIS — D689 Coagulation defect, unspecified: Secondary | ICD-10-CM | POA: Diagnosis not present

## 2018-03-21 DIAGNOSIS — E1140 Type 2 diabetes mellitus with diabetic neuropathy, unspecified: Secondary | ICD-10-CM | POA: Diagnosis not present

## 2018-03-21 DIAGNOSIS — D631 Anemia in chronic kidney disease: Secondary | ICD-10-CM | POA: Diagnosis not present

## 2018-03-23 DIAGNOSIS — E1140 Type 2 diabetes mellitus with diabetic neuropathy, unspecified: Secondary | ICD-10-CM | POA: Diagnosis not present

## 2018-03-23 DIAGNOSIS — D689 Coagulation defect, unspecified: Secondary | ICD-10-CM | POA: Diagnosis not present

## 2018-03-23 DIAGNOSIS — N186 End stage renal disease: Secondary | ICD-10-CM | POA: Diagnosis not present

## 2018-03-23 DIAGNOSIS — N2581 Secondary hyperparathyroidism of renal origin: Secondary | ICD-10-CM | POA: Diagnosis not present

## 2018-03-23 DIAGNOSIS — D631 Anemia in chronic kidney disease: Secondary | ICD-10-CM | POA: Diagnosis not present

## 2018-03-23 DIAGNOSIS — E1129 Type 2 diabetes mellitus with other diabetic kidney complication: Secondary | ICD-10-CM | POA: Diagnosis not present

## 2018-03-23 DIAGNOSIS — D509 Iron deficiency anemia, unspecified: Secondary | ICD-10-CM | POA: Diagnosis not present

## 2018-03-24 DIAGNOSIS — Z992 Dependence on renal dialysis: Secondary | ICD-10-CM | POA: Diagnosis not present

## 2018-03-24 DIAGNOSIS — N186 End stage renal disease: Secondary | ICD-10-CM | POA: Diagnosis not present

## 2018-03-24 DIAGNOSIS — E1122 Type 2 diabetes mellitus with diabetic chronic kidney disease: Secondary | ICD-10-CM | POA: Diagnosis not present

## 2018-03-25 DIAGNOSIS — D509 Iron deficiency anemia, unspecified: Secondary | ICD-10-CM | POA: Diagnosis not present

## 2018-03-25 DIAGNOSIS — N186 End stage renal disease: Secondary | ICD-10-CM | POA: Diagnosis not present

## 2018-03-25 DIAGNOSIS — N2581 Secondary hyperparathyroidism of renal origin: Secondary | ICD-10-CM | POA: Diagnosis not present

## 2018-03-25 DIAGNOSIS — E1140 Type 2 diabetes mellitus with diabetic neuropathy, unspecified: Secondary | ICD-10-CM | POA: Diagnosis not present

## 2018-03-25 DIAGNOSIS — E1129 Type 2 diabetes mellitus with other diabetic kidney complication: Secondary | ICD-10-CM | POA: Diagnosis not present

## 2018-03-25 DIAGNOSIS — D689 Coagulation defect, unspecified: Secondary | ICD-10-CM | POA: Diagnosis not present

## 2018-03-25 DIAGNOSIS — D631 Anemia in chronic kidney disease: Secondary | ICD-10-CM | POA: Diagnosis not present

## 2018-04-01 DIAGNOSIS — E113393 Type 2 diabetes mellitus with moderate nonproliferative diabetic retinopathy without macular edema, bilateral: Secondary | ICD-10-CM | POA: Diagnosis not present

## 2018-04-12 DIAGNOSIS — Z992 Dependence on renal dialysis: Secondary | ICD-10-CM | POA: Diagnosis not present

## 2018-04-12 DIAGNOSIS — N186 End stage renal disease: Secondary | ICD-10-CM | POA: Diagnosis not present

## 2018-04-12 DIAGNOSIS — I871 Compression of vein: Secondary | ICD-10-CM | POA: Diagnosis not present

## 2018-04-12 DIAGNOSIS — T82858A Stenosis of vascular prosthetic devices, implants and grafts, initial encounter: Secondary | ICD-10-CM | POA: Diagnosis not present

## 2018-04-23 DIAGNOSIS — Z992 Dependence on renal dialysis: Secondary | ICD-10-CM | POA: Diagnosis not present

## 2018-04-23 DIAGNOSIS — N186 End stage renal disease: Secondary | ICD-10-CM | POA: Diagnosis not present

## 2018-04-23 DIAGNOSIS — E1122 Type 2 diabetes mellitus with diabetic chronic kidney disease: Secondary | ICD-10-CM | POA: Diagnosis not present

## 2018-04-25 DIAGNOSIS — N186 End stage renal disease: Secondary | ICD-10-CM | POA: Diagnosis not present

## 2018-04-25 DIAGNOSIS — D509 Iron deficiency anemia, unspecified: Secondary | ICD-10-CM | POA: Diagnosis not present

## 2018-04-25 DIAGNOSIS — D689 Coagulation defect, unspecified: Secondary | ICD-10-CM | POA: Diagnosis not present

## 2018-04-25 DIAGNOSIS — N2581 Secondary hyperparathyroidism of renal origin: Secondary | ICD-10-CM | POA: Diagnosis not present

## 2018-04-25 DIAGNOSIS — E1140 Type 2 diabetes mellitus with diabetic neuropathy, unspecified: Secondary | ICD-10-CM | POA: Diagnosis not present

## 2018-04-25 DIAGNOSIS — E1129 Type 2 diabetes mellitus with other diabetic kidney complication: Secondary | ICD-10-CM | POA: Diagnosis not present

## 2018-04-27 DIAGNOSIS — D509 Iron deficiency anemia, unspecified: Secondary | ICD-10-CM | POA: Diagnosis not present

## 2018-04-27 DIAGNOSIS — N2581 Secondary hyperparathyroidism of renal origin: Secondary | ICD-10-CM | POA: Diagnosis not present

## 2018-04-27 DIAGNOSIS — N186 End stage renal disease: Secondary | ICD-10-CM | POA: Diagnosis not present

## 2018-04-27 DIAGNOSIS — E1129 Type 2 diabetes mellitus with other diabetic kidney complication: Secondary | ICD-10-CM | POA: Diagnosis not present

## 2018-04-27 DIAGNOSIS — E1140 Type 2 diabetes mellitus with diabetic neuropathy, unspecified: Secondary | ICD-10-CM | POA: Diagnosis not present

## 2018-04-27 DIAGNOSIS — D689 Coagulation defect, unspecified: Secondary | ICD-10-CM | POA: Diagnosis not present

## 2018-04-28 DIAGNOSIS — E11319 Type 2 diabetes mellitus with unspecified diabetic retinopathy without macular edema: Secondary | ICD-10-CM | POA: Diagnosis not present

## 2018-04-28 DIAGNOSIS — Z0000 Encounter for general adult medical examination without abnormal findings: Secondary | ICD-10-CM | POA: Diagnosis not present

## 2018-04-28 DIAGNOSIS — H35049 Retinal micro-aneurysms, unspecified, unspecified eye: Secondary | ICD-10-CM | POA: Diagnosis not present

## 2018-04-28 DIAGNOSIS — M159 Polyosteoarthritis, unspecified: Secondary | ICD-10-CM | POA: Diagnosis not present

## 2018-04-28 DIAGNOSIS — R0602 Shortness of breath: Secondary | ICD-10-CM | POA: Diagnosis not present

## 2018-04-29 DIAGNOSIS — E1129 Type 2 diabetes mellitus with other diabetic kidney complication: Secondary | ICD-10-CM | POA: Diagnosis not present

## 2018-04-29 DIAGNOSIS — D689 Coagulation defect, unspecified: Secondary | ICD-10-CM | POA: Diagnosis not present

## 2018-04-29 DIAGNOSIS — E1140 Type 2 diabetes mellitus with diabetic neuropathy, unspecified: Secondary | ICD-10-CM | POA: Diagnosis not present

## 2018-04-29 DIAGNOSIS — N2581 Secondary hyperparathyroidism of renal origin: Secondary | ICD-10-CM | POA: Diagnosis not present

## 2018-04-29 DIAGNOSIS — N186 End stage renal disease: Secondary | ICD-10-CM | POA: Diagnosis not present

## 2018-04-29 DIAGNOSIS — D509 Iron deficiency anemia, unspecified: Secondary | ICD-10-CM | POA: Diagnosis not present

## 2018-05-02 DIAGNOSIS — E1140 Type 2 diabetes mellitus with diabetic neuropathy, unspecified: Secondary | ICD-10-CM | POA: Diagnosis not present

## 2018-05-02 DIAGNOSIS — D509 Iron deficiency anemia, unspecified: Secondary | ICD-10-CM | POA: Diagnosis not present

## 2018-05-02 DIAGNOSIS — N186 End stage renal disease: Secondary | ICD-10-CM | POA: Diagnosis not present

## 2018-05-02 DIAGNOSIS — E1129 Type 2 diabetes mellitus with other diabetic kidney complication: Secondary | ICD-10-CM | POA: Diagnosis not present

## 2018-05-02 DIAGNOSIS — N2581 Secondary hyperparathyroidism of renal origin: Secondary | ICD-10-CM | POA: Diagnosis not present

## 2018-05-02 DIAGNOSIS — D689 Coagulation defect, unspecified: Secondary | ICD-10-CM | POA: Diagnosis not present

## 2018-05-03 DIAGNOSIS — Z452 Encounter for adjustment and management of vascular access device: Secondary | ICD-10-CM | POA: Diagnosis not present

## 2018-05-04 DIAGNOSIS — D689 Coagulation defect, unspecified: Secondary | ICD-10-CM | POA: Diagnosis not present

## 2018-05-04 DIAGNOSIS — N2581 Secondary hyperparathyroidism of renal origin: Secondary | ICD-10-CM | POA: Diagnosis not present

## 2018-05-04 DIAGNOSIS — E1129 Type 2 diabetes mellitus with other diabetic kidney complication: Secondary | ICD-10-CM | POA: Diagnosis not present

## 2018-05-04 DIAGNOSIS — E1140 Type 2 diabetes mellitus with diabetic neuropathy, unspecified: Secondary | ICD-10-CM | POA: Diagnosis not present

## 2018-05-04 DIAGNOSIS — D509 Iron deficiency anemia, unspecified: Secondary | ICD-10-CM | POA: Diagnosis not present

## 2018-05-04 DIAGNOSIS — N186 End stage renal disease: Secondary | ICD-10-CM | POA: Diagnosis not present

## 2018-05-06 DIAGNOSIS — D689 Coagulation defect, unspecified: Secondary | ICD-10-CM | POA: Diagnosis not present

## 2018-05-06 DIAGNOSIS — N186 End stage renal disease: Secondary | ICD-10-CM | POA: Diagnosis not present

## 2018-05-06 DIAGNOSIS — E1129 Type 2 diabetes mellitus with other diabetic kidney complication: Secondary | ICD-10-CM | POA: Diagnosis not present

## 2018-05-06 DIAGNOSIS — N2581 Secondary hyperparathyroidism of renal origin: Secondary | ICD-10-CM | POA: Diagnosis not present

## 2018-05-06 DIAGNOSIS — D509 Iron deficiency anemia, unspecified: Secondary | ICD-10-CM | POA: Diagnosis not present

## 2018-05-06 DIAGNOSIS — E1140 Type 2 diabetes mellitus with diabetic neuropathy, unspecified: Secondary | ICD-10-CM | POA: Diagnosis not present

## 2018-05-09 DIAGNOSIS — N186 End stage renal disease: Secondary | ICD-10-CM | POA: Diagnosis not present

## 2018-05-09 DIAGNOSIS — D689 Coagulation defect, unspecified: Secondary | ICD-10-CM | POA: Diagnosis not present

## 2018-05-09 DIAGNOSIS — N2581 Secondary hyperparathyroidism of renal origin: Secondary | ICD-10-CM | POA: Diagnosis not present

## 2018-05-09 DIAGNOSIS — D509 Iron deficiency anemia, unspecified: Secondary | ICD-10-CM | POA: Diagnosis not present

## 2018-05-09 DIAGNOSIS — E1129 Type 2 diabetes mellitus with other diabetic kidney complication: Secondary | ICD-10-CM | POA: Diagnosis not present

## 2018-05-09 DIAGNOSIS — E1140 Type 2 diabetes mellitus with diabetic neuropathy, unspecified: Secondary | ICD-10-CM | POA: Diagnosis not present

## 2018-05-11 DIAGNOSIS — D689 Coagulation defect, unspecified: Secondary | ICD-10-CM | POA: Diagnosis not present

## 2018-05-11 DIAGNOSIS — E1129 Type 2 diabetes mellitus with other diabetic kidney complication: Secondary | ICD-10-CM | POA: Diagnosis not present

## 2018-05-11 DIAGNOSIS — D509 Iron deficiency anemia, unspecified: Secondary | ICD-10-CM | POA: Diagnosis not present

## 2018-05-11 DIAGNOSIS — E1140 Type 2 diabetes mellitus with diabetic neuropathy, unspecified: Secondary | ICD-10-CM | POA: Diagnosis not present

## 2018-05-11 DIAGNOSIS — N2581 Secondary hyperparathyroidism of renal origin: Secondary | ICD-10-CM | POA: Diagnosis not present

## 2018-05-11 DIAGNOSIS — N186 End stage renal disease: Secondary | ICD-10-CM | POA: Diagnosis not present

## 2018-05-13 DIAGNOSIS — E1129 Type 2 diabetes mellitus with other diabetic kidney complication: Secondary | ICD-10-CM | POA: Diagnosis not present

## 2018-05-13 DIAGNOSIS — E1140 Type 2 diabetes mellitus with diabetic neuropathy, unspecified: Secondary | ICD-10-CM | POA: Diagnosis not present

## 2018-05-13 DIAGNOSIS — N2581 Secondary hyperparathyroidism of renal origin: Secondary | ICD-10-CM | POA: Diagnosis not present

## 2018-05-13 DIAGNOSIS — D689 Coagulation defect, unspecified: Secondary | ICD-10-CM | POA: Diagnosis not present

## 2018-05-13 DIAGNOSIS — D509 Iron deficiency anemia, unspecified: Secondary | ICD-10-CM | POA: Diagnosis not present

## 2018-05-13 DIAGNOSIS — N186 End stage renal disease: Secondary | ICD-10-CM | POA: Diagnosis not present

## 2018-05-15 DIAGNOSIS — E1140 Type 2 diabetes mellitus with diabetic neuropathy, unspecified: Secondary | ICD-10-CM | POA: Diagnosis not present

## 2018-05-15 DIAGNOSIS — N186 End stage renal disease: Secondary | ICD-10-CM | POA: Diagnosis not present

## 2018-05-15 DIAGNOSIS — D689 Coagulation defect, unspecified: Secondary | ICD-10-CM | POA: Diagnosis not present

## 2018-05-15 DIAGNOSIS — D509 Iron deficiency anemia, unspecified: Secondary | ICD-10-CM | POA: Diagnosis not present

## 2018-05-15 DIAGNOSIS — E1129 Type 2 diabetes mellitus with other diabetic kidney complication: Secondary | ICD-10-CM | POA: Diagnosis not present

## 2018-05-15 DIAGNOSIS — N2581 Secondary hyperparathyroidism of renal origin: Secondary | ICD-10-CM | POA: Diagnosis not present

## 2018-05-17 DIAGNOSIS — E1129 Type 2 diabetes mellitus with other diabetic kidney complication: Secondary | ICD-10-CM | POA: Diagnosis not present

## 2018-05-17 DIAGNOSIS — N2581 Secondary hyperparathyroidism of renal origin: Secondary | ICD-10-CM | POA: Diagnosis not present

## 2018-05-17 DIAGNOSIS — D689 Coagulation defect, unspecified: Secondary | ICD-10-CM | POA: Diagnosis not present

## 2018-05-17 DIAGNOSIS — E1140 Type 2 diabetes mellitus with diabetic neuropathy, unspecified: Secondary | ICD-10-CM | POA: Diagnosis not present

## 2018-05-17 DIAGNOSIS — N186 End stage renal disease: Secondary | ICD-10-CM | POA: Diagnosis not present

## 2018-05-17 DIAGNOSIS — D509 Iron deficiency anemia, unspecified: Secondary | ICD-10-CM | POA: Diagnosis not present

## 2018-05-20 DIAGNOSIS — D509 Iron deficiency anemia, unspecified: Secondary | ICD-10-CM | POA: Diagnosis not present

## 2018-05-20 DIAGNOSIS — D689 Coagulation defect, unspecified: Secondary | ICD-10-CM | POA: Diagnosis not present

## 2018-05-20 DIAGNOSIS — N186 End stage renal disease: Secondary | ICD-10-CM | POA: Diagnosis not present

## 2018-05-20 DIAGNOSIS — E1140 Type 2 diabetes mellitus with diabetic neuropathy, unspecified: Secondary | ICD-10-CM | POA: Diagnosis not present

## 2018-05-20 DIAGNOSIS — E1129 Type 2 diabetes mellitus with other diabetic kidney complication: Secondary | ICD-10-CM | POA: Diagnosis not present

## 2018-05-20 DIAGNOSIS — N2581 Secondary hyperparathyroidism of renal origin: Secondary | ICD-10-CM | POA: Diagnosis not present

## 2018-05-23 DIAGNOSIS — N186 End stage renal disease: Secondary | ICD-10-CM | POA: Diagnosis not present

## 2018-05-23 DIAGNOSIS — N2581 Secondary hyperparathyroidism of renal origin: Secondary | ICD-10-CM | POA: Diagnosis not present

## 2018-05-23 DIAGNOSIS — E1140 Type 2 diabetes mellitus with diabetic neuropathy, unspecified: Secondary | ICD-10-CM | POA: Diagnosis not present

## 2018-05-23 DIAGNOSIS — D509 Iron deficiency anemia, unspecified: Secondary | ICD-10-CM | POA: Diagnosis not present

## 2018-05-23 DIAGNOSIS — D689 Coagulation defect, unspecified: Secondary | ICD-10-CM | POA: Diagnosis not present

## 2018-05-23 DIAGNOSIS — E1129 Type 2 diabetes mellitus with other diabetic kidney complication: Secondary | ICD-10-CM | POA: Diagnosis not present

## 2018-05-24 DIAGNOSIS — E1129 Type 2 diabetes mellitus with other diabetic kidney complication: Secondary | ICD-10-CM | POA: Diagnosis not present

## 2018-05-24 DIAGNOSIS — E1140 Type 2 diabetes mellitus with diabetic neuropathy, unspecified: Secondary | ICD-10-CM | POA: Diagnosis not present

## 2018-05-24 DIAGNOSIS — N2581 Secondary hyperparathyroidism of renal origin: Secondary | ICD-10-CM | POA: Diagnosis not present

## 2018-05-24 DIAGNOSIS — E1122 Type 2 diabetes mellitus with diabetic chronic kidney disease: Secondary | ICD-10-CM | POA: Diagnosis not present

## 2018-05-24 DIAGNOSIS — N186 End stage renal disease: Secondary | ICD-10-CM | POA: Diagnosis not present

## 2018-05-24 DIAGNOSIS — D509 Iron deficiency anemia, unspecified: Secondary | ICD-10-CM | POA: Diagnosis not present

## 2018-05-24 DIAGNOSIS — Z992 Dependence on renal dialysis: Secondary | ICD-10-CM | POA: Diagnosis not present

## 2018-05-24 DIAGNOSIS — D689 Coagulation defect, unspecified: Secondary | ICD-10-CM | POA: Diagnosis not present

## 2018-05-27 DIAGNOSIS — N2581 Secondary hyperparathyroidism of renal origin: Secondary | ICD-10-CM | POA: Diagnosis not present

## 2018-05-27 DIAGNOSIS — E1129 Type 2 diabetes mellitus with other diabetic kidney complication: Secondary | ICD-10-CM | POA: Diagnosis not present

## 2018-05-27 DIAGNOSIS — N186 End stage renal disease: Secondary | ICD-10-CM | POA: Diagnosis not present

## 2018-05-27 DIAGNOSIS — E1140 Type 2 diabetes mellitus with diabetic neuropathy, unspecified: Secondary | ICD-10-CM | POA: Diagnosis not present

## 2018-05-27 DIAGNOSIS — D689 Coagulation defect, unspecified: Secondary | ICD-10-CM | POA: Diagnosis not present

## 2018-05-27 DIAGNOSIS — D631 Anemia in chronic kidney disease: Secondary | ICD-10-CM | POA: Diagnosis not present

## 2018-06-13 DIAGNOSIS — N186 End stage renal disease: Secondary | ICD-10-CM | POA: Diagnosis not present

## 2018-06-24 DIAGNOSIS — Z992 Dependence on renal dialysis: Secondary | ICD-10-CM | POA: Diagnosis not present

## 2018-06-24 DIAGNOSIS — N186 End stage renal disease: Secondary | ICD-10-CM | POA: Diagnosis not present

## 2018-06-24 DIAGNOSIS — E1122 Type 2 diabetes mellitus with diabetic chronic kidney disease: Secondary | ICD-10-CM | POA: Diagnosis not present

## 2018-06-27 DIAGNOSIS — E1140 Type 2 diabetes mellitus with diabetic neuropathy, unspecified: Secondary | ICD-10-CM | POA: Diagnosis not present

## 2018-06-27 DIAGNOSIS — D631 Anemia in chronic kidney disease: Secondary | ICD-10-CM | POA: Diagnosis not present

## 2018-06-27 DIAGNOSIS — N186 End stage renal disease: Secondary | ICD-10-CM | POA: Diagnosis not present

## 2018-06-27 DIAGNOSIS — E1129 Type 2 diabetes mellitus with other diabetic kidney complication: Secondary | ICD-10-CM | POA: Diagnosis not present

## 2018-06-27 DIAGNOSIS — D689 Coagulation defect, unspecified: Secondary | ICD-10-CM | POA: Diagnosis not present

## 2018-06-27 DIAGNOSIS — N2581 Secondary hyperparathyroidism of renal origin: Secondary | ICD-10-CM | POA: Diagnosis not present

## 2018-06-27 DIAGNOSIS — D509 Iron deficiency anemia, unspecified: Secondary | ICD-10-CM | POA: Diagnosis not present

## 2018-07-14 DIAGNOSIS — E119 Type 2 diabetes mellitus without complications: Secondary | ICD-10-CM | POA: Diagnosis not present

## 2018-07-14 DIAGNOSIS — N186 End stage renal disease: Secondary | ICD-10-CM | POA: Diagnosis not present

## 2018-07-14 DIAGNOSIS — I10 Essential (primary) hypertension: Secondary | ICD-10-CM | POA: Diagnosis not present

## 2018-07-14 DIAGNOSIS — K5909 Other constipation: Secondary | ICD-10-CM | POA: Diagnosis not present

## 2018-07-19 DIAGNOSIS — Z992 Dependence on renal dialysis: Secondary | ICD-10-CM | POA: Diagnosis not present

## 2018-07-19 DIAGNOSIS — I871 Compression of vein: Secondary | ICD-10-CM | POA: Diagnosis not present

## 2018-07-19 DIAGNOSIS — N186 End stage renal disease: Secondary | ICD-10-CM | POA: Diagnosis not present

## 2018-07-23 DIAGNOSIS — N186 End stage renal disease: Secondary | ICD-10-CM | POA: Diagnosis not present

## 2018-07-23 DIAGNOSIS — Z992 Dependence on renal dialysis: Secondary | ICD-10-CM | POA: Diagnosis not present

## 2018-07-23 DIAGNOSIS — E1122 Type 2 diabetes mellitus with diabetic chronic kidney disease: Secondary | ICD-10-CM | POA: Diagnosis not present

## 2018-07-25 DIAGNOSIS — N2581 Secondary hyperparathyroidism of renal origin: Secondary | ICD-10-CM | POA: Diagnosis not present

## 2018-07-25 DIAGNOSIS — N186 End stage renal disease: Secondary | ICD-10-CM | POA: Diagnosis not present

## 2018-07-25 DIAGNOSIS — E1129 Type 2 diabetes mellitus with other diabetic kidney complication: Secondary | ICD-10-CM | POA: Diagnosis not present

## 2018-07-25 DIAGNOSIS — D631 Anemia in chronic kidney disease: Secondary | ICD-10-CM | POA: Diagnosis not present

## 2018-07-25 DIAGNOSIS — D689 Coagulation defect, unspecified: Secondary | ICD-10-CM | POA: Diagnosis not present

## 2018-07-25 DIAGNOSIS — D509 Iron deficiency anemia, unspecified: Secondary | ICD-10-CM | POA: Diagnosis not present

## 2018-07-25 DIAGNOSIS — E1140 Type 2 diabetes mellitus with diabetic neuropathy, unspecified: Secondary | ICD-10-CM | POA: Diagnosis not present

## 2018-07-27 DIAGNOSIS — N186 End stage renal disease: Secondary | ICD-10-CM | POA: Diagnosis not present

## 2018-07-27 DIAGNOSIS — D509 Iron deficiency anemia, unspecified: Secondary | ICD-10-CM | POA: Diagnosis not present

## 2018-07-27 DIAGNOSIS — E1140 Type 2 diabetes mellitus with diabetic neuropathy, unspecified: Secondary | ICD-10-CM | POA: Diagnosis not present

## 2018-07-27 DIAGNOSIS — E1129 Type 2 diabetes mellitus with other diabetic kidney complication: Secondary | ICD-10-CM | POA: Diagnosis not present

## 2018-07-27 DIAGNOSIS — N2581 Secondary hyperparathyroidism of renal origin: Secondary | ICD-10-CM | POA: Diagnosis not present

## 2018-07-27 DIAGNOSIS — D631 Anemia in chronic kidney disease: Secondary | ICD-10-CM | POA: Diagnosis not present

## 2018-07-27 DIAGNOSIS — D689 Coagulation defect, unspecified: Secondary | ICD-10-CM | POA: Diagnosis not present

## 2018-07-29 DIAGNOSIS — E1140 Type 2 diabetes mellitus with diabetic neuropathy, unspecified: Secondary | ICD-10-CM | POA: Diagnosis not present

## 2018-07-29 DIAGNOSIS — N186 End stage renal disease: Secondary | ICD-10-CM | POA: Diagnosis not present

## 2018-07-29 DIAGNOSIS — N2581 Secondary hyperparathyroidism of renal origin: Secondary | ICD-10-CM | POA: Diagnosis not present

## 2018-07-29 DIAGNOSIS — D689 Coagulation defect, unspecified: Secondary | ICD-10-CM | POA: Diagnosis not present

## 2018-07-29 DIAGNOSIS — D509 Iron deficiency anemia, unspecified: Secondary | ICD-10-CM | POA: Diagnosis not present

## 2018-07-29 DIAGNOSIS — E1129 Type 2 diabetes mellitus with other diabetic kidney complication: Secondary | ICD-10-CM | POA: Diagnosis not present

## 2018-07-29 DIAGNOSIS — D631 Anemia in chronic kidney disease: Secondary | ICD-10-CM | POA: Diagnosis not present

## 2018-08-01 DIAGNOSIS — N2581 Secondary hyperparathyroidism of renal origin: Secondary | ICD-10-CM | POA: Diagnosis not present

## 2018-08-01 DIAGNOSIS — D509 Iron deficiency anemia, unspecified: Secondary | ICD-10-CM | POA: Diagnosis not present

## 2018-08-01 DIAGNOSIS — E1129 Type 2 diabetes mellitus with other diabetic kidney complication: Secondary | ICD-10-CM | POA: Diagnosis not present

## 2018-08-01 DIAGNOSIS — D689 Coagulation defect, unspecified: Secondary | ICD-10-CM | POA: Diagnosis not present

## 2018-08-01 DIAGNOSIS — N186 End stage renal disease: Secondary | ICD-10-CM | POA: Diagnosis not present

## 2018-08-01 DIAGNOSIS — E1140 Type 2 diabetes mellitus with diabetic neuropathy, unspecified: Secondary | ICD-10-CM | POA: Diagnosis not present

## 2018-08-01 DIAGNOSIS — D631 Anemia in chronic kidney disease: Secondary | ICD-10-CM | POA: Diagnosis not present

## 2018-08-03 DIAGNOSIS — N2581 Secondary hyperparathyroidism of renal origin: Secondary | ICD-10-CM | POA: Diagnosis not present

## 2018-08-03 DIAGNOSIS — E1140 Type 2 diabetes mellitus with diabetic neuropathy, unspecified: Secondary | ICD-10-CM | POA: Diagnosis not present

## 2018-08-03 DIAGNOSIS — D689 Coagulation defect, unspecified: Secondary | ICD-10-CM | POA: Diagnosis not present

## 2018-08-03 DIAGNOSIS — E1129 Type 2 diabetes mellitus with other diabetic kidney complication: Secondary | ICD-10-CM | POA: Diagnosis not present

## 2018-08-03 DIAGNOSIS — N186 End stage renal disease: Secondary | ICD-10-CM | POA: Diagnosis not present

## 2018-08-03 DIAGNOSIS — D509 Iron deficiency anemia, unspecified: Secondary | ICD-10-CM | POA: Diagnosis not present

## 2018-08-03 DIAGNOSIS — D631 Anemia in chronic kidney disease: Secondary | ICD-10-CM | POA: Diagnosis not present

## 2018-08-05 DIAGNOSIS — E1140 Type 2 diabetes mellitus with diabetic neuropathy, unspecified: Secondary | ICD-10-CM | POA: Diagnosis not present

## 2018-08-05 DIAGNOSIS — E1129 Type 2 diabetes mellitus with other diabetic kidney complication: Secondary | ICD-10-CM | POA: Diagnosis not present

## 2018-08-05 DIAGNOSIS — N2581 Secondary hyperparathyroidism of renal origin: Secondary | ICD-10-CM | POA: Diagnosis not present

## 2018-08-05 DIAGNOSIS — D689 Coagulation defect, unspecified: Secondary | ICD-10-CM | POA: Diagnosis not present

## 2018-08-05 DIAGNOSIS — N186 End stage renal disease: Secondary | ICD-10-CM | POA: Diagnosis not present

## 2018-08-05 DIAGNOSIS — D509 Iron deficiency anemia, unspecified: Secondary | ICD-10-CM | POA: Diagnosis not present

## 2018-08-05 DIAGNOSIS — D631 Anemia in chronic kidney disease: Secondary | ICD-10-CM | POA: Diagnosis not present

## 2018-08-08 DIAGNOSIS — D509 Iron deficiency anemia, unspecified: Secondary | ICD-10-CM | POA: Diagnosis not present

## 2018-08-08 DIAGNOSIS — E1129 Type 2 diabetes mellitus with other diabetic kidney complication: Secondary | ICD-10-CM | POA: Diagnosis not present

## 2018-08-08 DIAGNOSIS — D689 Coagulation defect, unspecified: Secondary | ICD-10-CM | POA: Diagnosis not present

## 2018-08-08 DIAGNOSIS — D631 Anemia in chronic kidney disease: Secondary | ICD-10-CM | POA: Diagnosis not present

## 2018-08-08 DIAGNOSIS — E1140 Type 2 diabetes mellitus with diabetic neuropathy, unspecified: Secondary | ICD-10-CM | POA: Diagnosis not present

## 2018-08-08 DIAGNOSIS — N186 End stage renal disease: Secondary | ICD-10-CM | POA: Diagnosis not present

## 2018-08-08 DIAGNOSIS — N2581 Secondary hyperparathyroidism of renal origin: Secondary | ICD-10-CM | POA: Diagnosis not present

## 2018-08-10 DIAGNOSIS — D509 Iron deficiency anemia, unspecified: Secondary | ICD-10-CM | POA: Diagnosis not present

## 2018-08-10 DIAGNOSIS — N186 End stage renal disease: Secondary | ICD-10-CM | POA: Diagnosis not present

## 2018-08-10 DIAGNOSIS — D689 Coagulation defect, unspecified: Secondary | ICD-10-CM | POA: Diagnosis not present

## 2018-08-10 DIAGNOSIS — E1140 Type 2 diabetes mellitus with diabetic neuropathy, unspecified: Secondary | ICD-10-CM | POA: Diagnosis not present

## 2018-08-10 DIAGNOSIS — N2581 Secondary hyperparathyroidism of renal origin: Secondary | ICD-10-CM | POA: Diagnosis not present

## 2018-08-10 DIAGNOSIS — D631 Anemia in chronic kidney disease: Secondary | ICD-10-CM | POA: Diagnosis not present

## 2018-08-10 DIAGNOSIS — E1129 Type 2 diabetes mellitus with other diabetic kidney complication: Secondary | ICD-10-CM | POA: Diagnosis not present

## 2018-08-12 DIAGNOSIS — N2581 Secondary hyperparathyroidism of renal origin: Secondary | ICD-10-CM | POA: Diagnosis not present

## 2018-08-12 DIAGNOSIS — E1129 Type 2 diabetes mellitus with other diabetic kidney complication: Secondary | ICD-10-CM | POA: Diagnosis not present

## 2018-08-12 DIAGNOSIS — D509 Iron deficiency anemia, unspecified: Secondary | ICD-10-CM | POA: Diagnosis not present

## 2018-08-12 DIAGNOSIS — D689 Coagulation defect, unspecified: Secondary | ICD-10-CM | POA: Diagnosis not present

## 2018-08-12 DIAGNOSIS — E1140 Type 2 diabetes mellitus with diabetic neuropathy, unspecified: Secondary | ICD-10-CM | POA: Diagnosis not present

## 2018-08-12 DIAGNOSIS — D631 Anemia in chronic kidney disease: Secondary | ICD-10-CM | POA: Diagnosis not present

## 2018-08-12 DIAGNOSIS — N186 End stage renal disease: Secondary | ICD-10-CM | POA: Diagnosis not present

## 2018-08-15 DIAGNOSIS — D509 Iron deficiency anemia, unspecified: Secondary | ICD-10-CM | POA: Diagnosis not present

## 2018-08-15 DIAGNOSIS — E1140 Type 2 diabetes mellitus with diabetic neuropathy, unspecified: Secondary | ICD-10-CM | POA: Diagnosis not present

## 2018-08-15 DIAGNOSIS — N2581 Secondary hyperparathyroidism of renal origin: Secondary | ICD-10-CM | POA: Diagnosis not present

## 2018-08-15 DIAGNOSIS — E1129 Type 2 diabetes mellitus with other diabetic kidney complication: Secondary | ICD-10-CM | POA: Diagnosis not present

## 2018-08-15 DIAGNOSIS — N186 End stage renal disease: Secondary | ICD-10-CM | POA: Diagnosis not present

## 2018-08-15 DIAGNOSIS — D631 Anemia in chronic kidney disease: Secondary | ICD-10-CM | POA: Diagnosis not present

## 2018-08-15 DIAGNOSIS — D689 Coagulation defect, unspecified: Secondary | ICD-10-CM | POA: Diagnosis not present

## 2018-08-17 DIAGNOSIS — D509 Iron deficiency anemia, unspecified: Secondary | ICD-10-CM | POA: Diagnosis not present

## 2018-08-17 DIAGNOSIS — E1140 Type 2 diabetes mellitus with diabetic neuropathy, unspecified: Secondary | ICD-10-CM | POA: Diagnosis not present

## 2018-08-17 DIAGNOSIS — E1129 Type 2 diabetes mellitus with other diabetic kidney complication: Secondary | ICD-10-CM | POA: Diagnosis not present

## 2018-08-17 DIAGNOSIS — D631 Anemia in chronic kidney disease: Secondary | ICD-10-CM | POA: Diagnosis not present

## 2018-08-17 DIAGNOSIS — N2581 Secondary hyperparathyroidism of renal origin: Secondary | ICD-10-CM | POA: Diagnosis not present

## 2018-08-17 DIAGNOSIS — D689 Coagulation defect, unspecified: Secondary | ICD-10-CM | POA: Diagnosis not present

## 2018-08-17 DIAGNOSIS — N186 End stage renal disease: Secondary | ICD-10-CM | POA: Diagnosis not present

## 2018-08-19 DIAGNOSIS — E1129 Type 2 diabetes mellitus with other diabetic kidney complication: Secondary | ICD-10-CM | POA: Diagnosis not present

## 2018-08-19 DIAGNOSIS — E1140 Type 2 diabetes mellitus with diabetic neuropathy, unspecified: Secondary | ICD-10-CM | POA: Diagnosis not present

## 2018-08-19 DIAGNOSIS — D689 Coagulation defect, unspecified: Secondary | ICD-10-CM | POA: Diagnosis not present

## 2018-08-19 DIAGNOSIS — N186 End stage renal disease: Secondary | ICD-10-CM | POA: Diagnosis not present

## 2018-08-19 DIAGNOSIS — D509 Iron deficiency anemia, unspecified: Secondary | ICD-10-CM | POA: Diagnosis not present

## 2018-08-19 DIAGNOSIS — N2581 Secondary hyperparathyroidism of renal origin: Secondary | ICD-10-CM | POA: Diagnosis not present

## 2018-08-19 DIAGNOSIS — D631 Anemia in chronic kidney disease: Secondary | ICD-10-CM | POA: Diagnosis not present

## 2018-08-22 DIAGNOSIS — E1140 Type 2 diabetes mellitus with diabetic neuropathy, unspecified: Secondary | ICD-10-CM | POA: Diagnosis not present

## 2018-08-22 DIAGNOSIS — N186 End stage renal disease: Secondary | ICD-10-CM | POA: Diagnosis not present

## 2018-08-22 DIAGNOSIS — D631 Anemia in chronic kidney disease: Secondary | ICD-10-CM | POA: Diagnosis not present

## 2018-08-22 DIAGNOSIS — D509 Iron deficiency anemia, unspecified: Secondary | ICD-10-CM | POA: Diagnosis not present

## 2018-08-22 DIAGNOSIS — N2581 Secondary hyperparathyroidism of renal origin: Secondary | ICD-10-CM | POA: Diagnosis not present

## 2018-08-22 DIAGNOSIS — D689 Coagulation defect, unspecified: Secondary | ICD-10-CM | POA: Diagnosis not present

## 2018-08-22 DIAGNOSIS — E1129 Type 2 diabetes mellitus with other diabetic kidney complication: Secondary | ICD-10-CM | POA: Diagnosis not present

## 2018-08-23 DIAGNOSIS — E1122 Type 2 diabetes mellitus with diabetic chronic kidney disease: Secondary | ICD-10-CM | POA: Diagnosis not present

## 2018-08-23 DIAGNOSIS — Z992 Dependence on renal dialysis: Secondary | ICD-10-CM | POA: Diagnosis not present

## 2018-08-23 DIAGNOSIS — N186 End stage renal disease: Secondary | ICD-10-CM | POA: Diagnosis not present

## 2018-08-24 DIAGNOSIS — N2581 Secondary hyperparathyroidism of renal origin: Secondary | ICD-10-CM | POA: Diagnosis not present

## 2018-08-24 DIAGNOSIS — E1129 Type 2 diabetes mellitus with other diabetic kidney complication: Secondary | ICD-10-CM | POA: Diagnosis not present

## 2018-08-24 DIAGNOSIS — N186 End stage renal disease: Secondary | ICD-10-CM | POA: Diagnosis not present

## 2018-08-24 DIAGNOSIS — E1140 Type 2 diabetes mellitus with diabetic neuropathy, unspecified: Secondary | ICD-10-CM | POA: Diagnosis not present

## 2018-08-24 DIAGNOSIS — D631 Anemia in chronic kidney disease: Secondary | ICD-10-CM | POA: Diagnosis not present

## 2018-08-24 DIAGNOSIS — D689 Coagulation defect, unspecified: Secondary | ICD-10-CM | POA: Diagnosis not present

## 2018-09-07 DIAGNOSIS — Z992 Dependence on renal dialysis: Secondary | ICD-10-CM | POA: Diagnosis not present

## 2018-09-07 DIAGNOSIS — T8249XA Other complication of vascular dialysis catheter, initial encounter: Secondary | ICD-10-CM | POA: Diagnosis not present

## 2018-09-07 DIAGNOSIS — N186 End stage renal disease: Secondary | ICD-10-CM | POA: Diagnosis not present

## 2018-09-16 DIAGNOSIS — N2581 Secondary hyperparathyroidism of renal origin: Secondary | ICD-10-CM | POA: Diagnosis not present

## 2018-09-16 DIAGNOSIS — E1129 Type 2 diabetes mellitus with other diabetic kidney complication: Secondary | ICD-10-CM | POA: Diagnosis not present

## 2018-09-16 DIAGNOSIS — N186 End stage renal disease: Secondary | ICD-10-CM | POA: Diagnosis not present

## 2018-09-16 DIAGNOSIS — E1140 Type 2 diabetes mellitus with diabetic neuropathy, unspecified: Secondary | ICD-10-CM | POA: Diagnosis not present

## 2018-09-16 DIAGNOSIS — D631 Anemia in chronic kidney disease: Secondary | ICD-10-CM | POA: Diagnosis not present

## 2018-09-16 DIAGNOSIS — D689 Coagulation defect, unspecified: Secondary | ICD-10-CM | POA: Diagnosis not present

## 2018-09-22 DIAGNOSIS — N186 End stage renal disease: Secondary | ICD-10-CM | POA: Diagnosis not present

## 2018-09-22 DIAGNOSIS — E1122 Type 2 diabetes mellitus with diabetic chronic kidney disease: Secondary | ICD-10-CM | POA: Diagnosis not present

## 2018-09-22 DIAGNOSIS — Z992 Dependence on renal dialysis: Secondary | ICD-10-CM | POA: Diagnosis not present

## 2018-09-23 DIAGNOSIS — E1129 Type 2 diabetes mellitus with other diabetic kidney complication: Secondary | ICD-10-CM | POA: Diagnosis not present

## 2018-09-23 DIAGNOSIS — N186 End stage renal disease: Secondary | ICD-10-CM | POA: Diagnosis not present

## 2018-09-23 DIAGNOSIS — N2581 Secondary hyperparathyroidism of renal origin: Secondary | ICD-10-CM | POA: Diagnosis not present

## 2018-09-23 DIAGNOSIS — D631 Anemia in chronic kidney disease: Secondary | ICD-10-CM | POA: Diagnosis not present

## 2018-09-23 DIAGNOSIS — D689 Coagulation defect, unspecified: Secondary | ICD-10-CM | POA: Diagnosis not present

## 2018-09-23 DIAGNOSIS — E1140 Type 2 diabetes mellitus with diabetic neuropathy, unspecified: Secondary | ICD-10-CM | POA: Diagnosis not present

## 2018-10-23 DIAGNOSIS — N186 End stage renal disease: Secondary | ICD-10-CM | POA: Diagnosis not present

## 2018-10-23 DIAGNOSIS — E1122 Type 2 diabetes mellitus with diabetic chronic kidney disease: Secondary | ICD-10-CM | POA: Diagnosis not present

## 2018-10-23 DIAGNOSIS — Z992 Dependence on renal dialysis: Secondary | ICD-10-CM | POA: Diagnosis not present

## 2018-10-24 DIAGNOSIS — D689 Coagulation defect, unspecified: Secondary | ICD-10-CM | POA: Diagnosis not present

## 2018-10-24 DIAGNOSIS — E1129 Type 2 diabetes mellitus with other diabetic kidney complication: Secondary | ICD-10-CM | POA: Diagnosis not present

## 2018-10-24 DIAGNOSIS — D509 Iron deficiency anemia, unspecified: Secondary | ICD-10-CM | POA: Diagnosis not present

## 2018-10-24 DIAGNOSIS — D631 Anemia in chronic kidney disease: Secondary | ICD-10-CM | POA: Diagnosis not present

## 2018-10-24 DIAGNOSIS — N186 End stage renal disease: Secondary | ICD-10-CM | POA: Diagnosis not present

## 2018-10-24 DIAGNOSIS — N2581 Secondary hyperparathyroidism of renal origin: Secondary | ICD-10-CM | POA: Diagnosis not present

## 2018-10-24 DIAGNOSIS — E1140 Type 2 diabetes mellitus with diabetic neuropathy, unspecified: Secondary | ICD-10-CM | POA: Diagnosis not present

## 2018-11-01 DIAGNOSIS — Z0000 Encounter for general adult medical examination without abnormal findings: Secondary | ICD-10-CM | POA: Diagnosis not present

## 2018-11-01 DIAGNOSIS — M159 Polyosteoarthritis, unspecified: Secondary | ICD-10-CM | POA: Diagnosis not present

## 2018-11-01 DIAGNOSIS — E11319 Type 2 diabetes mellitus with unspecified diabetic retinopathy without macular edema: Secondary | ICD-10-CM | POA: Diagnosis not present

## 2018-11-01 DIAGNOSIS — H35049 Retinal micro-aneurysms, unspecified, unspecified eye: Secondary | ICD-10-CM | POA: Diagnosis not present

## 2018-11-01 DIAGNOSIS — E1140 Type 2 diabetes mellitus with diabetic neuropathy, unspecified: Secondary | ICD-10-CM | POA: Diagnosis not present

## 2018-11-10 ENCOUNTER — Telehealth: Payer: Medicare HMO | Admitting: Cardiovascular Disease

## 2018-11-14 DIAGNOSIS — D631 Anemia in chronic kidney disease: Secondary | ICD-10-CM | POA: Diagnosis not present

## 2018-11-14 DIAGNOSIS — N186 End stage renal disease: Secondary | ICD-10-CM | POA: Diagnosis not present

## 2018-11-14 DIAGNOSIS — D509 Iron deficiency anemia, unspecified: Secondary | ICD-10-CM | POA: Diagnosis not present

## 2018-11-14 DIAGNOSIS — D689 Coagulation defect, unspecified: Secondary | ICD-10-CM | POA: Diagnosis not present

## 2018-11-14 DIAGNOSIS — N2581 Secondary hyperparathyroidism of renal origin: Secondary | ICD-10-CM | POA: Diagnosis not present

## 2018-11-14 DIAGNOSIS — E1140 Type 2 diabetes mellitus with diabetic neuropathy, unspecified: Secondary | ICD-10-CM | POA: Diagnosis not present

## 2018-11-14 DIAGNOSIS — E1129 Type 2 diabetes mellitus with other diabetic kidney complication: Secondary | ICD-10-CM | POA: Diagnosis not present

## 2018-11-22 DIAGNOSIS — N186 End stage renal disease: Secondary | ICD-10-CM | POA: Diagnosis not present

## 2018-11-22 DIAGNOSIS — E1122 Type 2 diabetes mellitus with diabetic chronic kidney disease: Secondary | ICD-10-CM | POA: Diagnosis not present

## 2018-11-22 DIAGNOSIS — Z992 Dependence on renal dialysis: Secondary | ICD-10-CM | POA: Diagnosis not present

## 2018-11-23 DIAGNOSIS — N186 End stage renal disease: Secondary | ICD-10-CM | POA: Diagnosis not present

## 2018-11-23 DIAGNOSIS — E1129 Type 2 diabetes mellitus with other diabetic kidney complication: Secondary | ICD-10-CM | POA: Diagnosis not present

## 2018-11-23 DIAGNOSIS — N2581 Secondary hyperparathyroidism of renal origin: Secondary | ICD-10-CM | POA: Diagnosis not present

## 2018-11-23 DIAGNOSIS — E1140 Type 2 diabetes mellitus with diabetic neuropathy, unspecified: Secondary | ICD-10-CM | POA: Diagnosis not present

## 2018-11-23 DIAGNOSIS — D509 Iron deficiency anemia, unspecified: Secondary | ICD-10-CM | POA: Diagnosis not present

## 2018-11-23 DIAGNOSIS — D631 Anemia in chronic kidney disease: Secondary | ICD-10-CM | POA: Diagnosis not present

## 2018-11-23 DIAGNOSIS — D689 Coagulation defect, unspecified: Secondary | ICD-10-CM | POA: Diagnosis not present

## 2018-12-12 DIAGNOSIS — D689 Coagulation defect, unspecified: Secondary | ICD-10-CM | POA: Diagnosis not present

## 2018-12-12 DIAGNOSIS — N186 End stage renal disease: Secondary | ICD-10-CM | POA: Diagnosis not present

## 2018-12-12 DIAGNOSIS — N2581 Secondary hyperparathyroidism of renal origin: Secondary | ICD-10-CM | POA: Diagnosis not present

## 2018-12-12 DIAGNOSIS — E1140 Type 2 diabetes mellitus with diabetic neuropathy, unspecified: Secondary | ICD-10-CM | POA: Diagnosis not present

## 2018-12-12 DIAGNOSIS — E1129 Type 2 diabetes mellitus with other diabetic kidney complication: Secondary | ICD-10-CM | POA: Diagnosis not present

## 2018-12-12 DIAGNOSIS — D631 Anemia in chronic kidney disease: Secondary | ICD-10-CM | POA: Diagnosis not present

## 2018-12-12 DIAGNOSIS — D509 Iron deficiency anemia, unspecified: Secondary | ICD-10-CM | POA: Diagnosis not present

## 2018-12-23 DIAGNOSIS — Z992 Dependence on renal dialysis: Secondary | ICD-10-CM | POA: Diagnosis not present

## 2018-12-23 DIAGNOSIS — N186 End stage renal disease: Secondary | ICD-10-CM | POA: Diagnosis not present

## 2018-12-23 DIAGNOSIS — E1122 Type 2 diabetes mellitus with diabetic chronic kidney disease: Secondary | ICD-10-CM | POA: Diagnosis not present

## 2018-12-26 DIAGNOSIS — E1140 Type 2 diabetes mellitus with diabetic neuropathy, unspecified: Secondary | ICD-10-CM | POA: Diagnosis not present

## 2018-12-26 DIAGNOSIS — D509 Iron deficiency anemia, unspecified: Secondary | ICD-10-CM | POA: Diagnosis not present

## 2018-12-26 DIAGNOSIS — D689 Coagulation defect, unspecified: Secondary | ICD-10-CM | POA: Diagnosis not present

## 2018-12-26 DIAGNOSIS — E1129 Type 2 diabetes mellitus with other diabetic kidney complication: Secondary | ICD-10-CM | POA: Diagnosis not present

## 2018-12-26 DIAGNOSIS — N2581 Secondary hyperparathyroidism of renal origin: Secondary | ICD-10-CM | POA: Diagnosis not present

## 2018-12-26 DIAGNOSIS — Z992 Dependence on renal dialysis: Secondary | ICD-10-CM | POA: Diagnosis not present

## 2018-12-26 DIAGNOSIS — N186 End stage renal disease: Secondary | ICD-10-CM | POA: Diagnosis not present

## 2018-12-27 DIAGNOSIS — K5909 Other constipation: Secondary | ICD-10-CM | POA: Diagnosis not present

## 2018-12-27 DIAGNOSIS — K219 Gastro-esophageal reflux disease without esophagitis: Secondary | ICD-10-CM | POA: Diagnosis not present

## 2018-12-27 DIAGNOSIS — R079 Chest pain, unspecified: Secondary | ICD-10-CM | POA: Diagnosis not present

## 2018-12-27 DIAGNOSIS — Z86010 Personal history of colonic polyps: Secondary | ICD-10-CM | POA: Diagnosis not present

## 2018-12-27 DIAGNOSIS — N185 Chronic kidney disease, stage 5: Secondary | ICD-10-CM | POA: Diagnosis not present

## 2018-12-27 DIAGNOSIS — K2930 Chronic superficial gastritis without bleeding: Secondary | ICD-10-CM | POA: Diagnosis not present

## 2018-12-27 DIAGNOSIS — R0789 Other chest pain: Secondary | ICD-10-CM | POA: Diagnosis not present

## 2019-01-03 DIAGNOSIS — R079 Chest pain, unspecified: Secondary | ICD-10-CM | POA: Diagnosis not present

## 2019-01-05 ENCOUNTER — Ambulatory Visit: Payer: Medicare HMO | Admitting: Internal Medicine

## 2019-01-16 DIAGNOSIS — Z992 Dependence on renal dialysis: Secondary | ICD-10-CM | POA: Diagnosis not present

## 2019-01-16 DIAGNOSIS — E1129 Type 2 diabetes mellitus with other diabetic kidney complication: Secondary | ICD-10-CM | POA: Diagnosis not present

## 2019-01-16 DIAGNOSIS — N186 End stage renal disease: Secondary | ICD-10-CM | POA: Diagnosis not present

## 2019-01-16 DIAGNOSIS — D509 Iron deficiency anemia, unspecified: Secondary | ICD-10-CM | POA: Diagnosis not present

## 2019-01-16 DIAGNOSIS — N2581 Secondary hyperparathyroidism of renal origin: Secondary | ICD-10-CM | POA: Diagnosis not present

## 2019-01-16 DIAGNOSIS — E1140 Type 2 diabetes mellitus with diabetic neuropathy, unspecified: Secondary | ICD-10-CM | POA: Diagnosis not present

## 2019-01-16 DIAGNOSIS — D689 Coagulation defect, unspecified: Secondary | ICD-10-CM | POA: Diagnosis not present

## 2019-01-19 ENCOUNTER — Ambulatory Visit (INDEPENDENT_AMBULATORY_CARE_PROVIDER_SITE_OTHER): Payer: Medicare HMO | Admitting: Nurse Practitioner

## 2019-01-19 ENCOUNTER — Ambulatory Visit (INDEPENDENT_AMBULATORY_CARE_PROVIDER_SITE_OTHER): Payer: Medicare HMO

## 2019-01-19 ENCOUNTER — Encounter (INDEPENDENT_AMBULATORY_CARE_PROVIDER_SITE_OTHER): Payer: Self-pay | Admitting: Nurse Practitioner

## 2019-01-19 ENCOUNTER — Other Ambulatory Visit (INDEPENDENT_AMBULATORY_CARE_PROVIDER_SITE_OTHER): Payer: Self-pay | Admitting: Vascular Surgery

## 2019-01-19 ENCOUNTER — Other Ambulatory Visit: Payer: Self-pay

## 2019-01-19 ENCOUNTER — Encounter (INDEPENDENT_AMBULATORY_CARE_PROVIDER_SITE_OTHER): Payer: Self-pay

## 2019-01-19 VITALS — BP 145/74 | HR 62 | Resp 10 | Ht 62.0 in | Wt 149.0 lb

## 2019-01-19 DIAGNOSIS — N186 End stage renal disease: Secondary | ICD-10-CM

## 2019-01-19 DIAGNOSIS — I10 Essential (primary) hypertension: Secondary | ICD-10-CM | POA: Diagnosis not present

## 2019-01-19 DIAGNOSIS — Z992 Dependence on renal dialysis: Secondary | ICD-10-CM

## 2019-01-19 DIAGNOSIS — R69 Illness, unspecified: Secondary | ICD-10-CM | POA: Diagnosis not present

## 2019-01-19 DIAGNOSIS — F0390 Unspecified dementia without behavioral disturbance: Secondary | ICD-10-CM

## 2019-01-19 DIAGNOSIS — E1169 Type 2 diabetes mellitus with other specified complication: Secondary | ICD-10-CM | POA: Diagnosis not present

## 2019-01-19 DIAGNOSIS — F0150 Vascular dementia without behavioral disturbance: Secondary | ICD-10-CM

## 2019-01-19 NOTE — Progress Notes (Signed)
SUBJECTIVE:  Patient ID: Joseph Mcpherson, male    DOB: 07-30-1945, 73 y.o.   MRN: [id number] Chief Complaint  Patient presents with  . New Patient (Initial Visit)    HPI  Joseph Mcpherson is a 73 y.o. male  The patient is seen for evaluation of dialysis access.  The patient was seen in our office approximately 2 years ago for evaluation of dialysis access.  At that time the patient was not quite ready for dialysis based on his renal function.  Since that time he had a right brachiocephalic AV fistula.  The patient has a history of failed brachiocephalic AV fistula.  The cephalic vein is occluded just distal to the arterial anastomosis site.  Current access is via a catheter which is functioning without issue.    The patient denies amaurosis fugax or recent TIA symptoms. There are no recent neurological changes noted. The patient denies claudication symptoms or rest pain symptoms. The patient denies history of DVT, PE or superficial thrombophlebitis. The patient denies recent episodes of angina or shortness of breath.   Based on noninvasive studies the patient currently has access for a left brachial axillary graft placement. Past Medical History:  Diagnosis Date  . Chronic kidney disease   . Clotting disorder ([place])    per daughter, "when patient travels, his feet swell"  . Diabetes mellitus without complication ([place])   . GERD (gastroesophageal reflux disease)   . Hyperlipidemia   . Hypertension   . Hypothyroidism   . Thyroid disease     Past Surgical History:  Procedure Laterality Date  . AV FISTULA PLACEMENT Right 10/22/2017   Procedure: ARTERIOVENOUS (AV) BRACHIOCEPHALIC FISTULA CREATION RIGHT UPPER ARM;  Surgeon: Conrad Riddle, MD;  Location: [facility];  Service: Vascular;  Laterality: Right;    Social History   Socioeconomic History  . Marital status: Married    Spouse name: Not on file  . Number of children: Not on file  . Years of education: Not on file  . Highest education  level: Not on file  Occupational History  . Not on file  Social Needs  . Financial resource strain: Not on file  . Food insecurity    Worry: Not on file    Inability: Not on file  . Transportation needs    Medical: Not on file    Non-medical: Not on file  Tobacco Use  . Smoking status: Never Smoker  . Smokeless tobacco: Current User    Types: Snuff  Substance and Sexual Activity  . Alcohol use: No  . Drug use: No  . Sexual activity: Not on file  Lifestyle  . Physical activity    Days per week: Not on file    Minutes per session: Not on file  . Stress: Not on file  Relationships  . Social Herbalist on phone: Not on file    Gets together: Not on file    Attends religious service: Not on file    Active member of club or organization: Not on file    Attends meetings of clubs or organizations: Not on file    Relationship status: Not on file  . Intimate partner violence    Fear of current or ex partner: Not on file    Emotionally abused: Not on file    Physically abused: Not on file    Forced sexual activity: Not on file  Other Topics Concern  . Not on file  Social History Narrative  .  Not on file    Family History  Problem Relation Age of Onset  . Diabetes Mother   . Hyperlipidemia Mother   . Hypertension Father     No Known Allergies   Review of Systems   Review of Systems: Negative Unless Checked Constitutional: [] Weight loss  [] Fever  [] Chills Cardiac: [] Chest pain   []  Atrial Fibrillation  [] Palpitations   [] Shortness of breath when laying flat   [] Shortness of breath with exertion. [] Shortness of breath at rest Vascular:  [] Pain in legs with walking   [] Pain in legs with standing [] Pain in legs when laying flat   [] Claudication    [] Pain in feet when laying flat    [] History of DVT   [] Phlebitis   [x] Swelling in legs   [] Varicose veins   [] Non-healing ulcers Pulmonary:   [] Uses home oxygen   [] Productive cough   [] Hemoptysis   [] Wheeze  [] COPD    [] Asthma Neurologic:  [] Dizziness   [] Seizures  [] Blackouts [] History of stroke   [] History of TIA  [] Aphasia   [] Temporary Blindness   [] Weakness or numbness in arm   [] Weakness or numbness in leg Musculoskeletal:   [] Joint swelling   [] Joint pain   [] Low back pain  []  History of Knee Replacement [] Arthritis [] back Surgeries  []  Spinal Stenosis    Hematologic:  [] Easy bruising  [] Easy bleeding   [] Hypercoagulable state   [x] Anemic Gastrointestinal:  [] Diarrhea   [] Vomiting  [x] Gastroesophageal reflux/heartburn   [] Difficulty swallowing. [] Abdominal pain Genitourinary:  [x] Chronic kidney disease   [] Difficult urination  [] Anuric   [] Blood in urine [] Frequent urination  [] Burning with urination   [] Hematuria Skin:  [] Rashes   [] Ulcers [] Wounds Psychological:  [] History of anxiety   []  History of major depression  [x]  Memory Difficulties      OBJECTIVE:   Physical Exam  BP (!) 145/74 (BP Location: Left Arm, Patient Position: Sitting, Cuff Size: Normal)   Pulse 62   Resp 10   Ht 5\' 2"  (1.575 m)   Wt 149 lb (67.6 kg)   BMI 27.25 kg/m   Gen: WD/WN, NAD Head: /AT, No temporalis wasting.  Ear/Nose/Throat: Hearing grossly intact, nares w/o erythema or drainage Eyes: PER, EOMI, sclera nonicteric.  Neck: Supple, no masses.  No JVD.  Pulmonary:  Good air movement, no use of accessory muscles.  Cardiac: RRR Vascular:  Vessel Right Left  Radial Palpable Palpable   Gastrointestinal: soft, non-distended. No guarding/no peritoneal signs.  Musculoskeletal: M/S 5/5 throughout.  No deformity or atrophy.  Neurologic: Pain and light touch intact in extremities.  Symmetrical.  Speech is fluent. Motor exam as listed above. Psychiatric: Judgment intact, Mood & affect appropriate for pt's clinical situation. Dermatologic: No Venous rashes. No Ulcers Noted.  No changes consistent with cellulitis. Lymph : No Cervical lymphadenopathy, no lichenification or skin changes of chronic lymphedema.        ASSESSMENT AND PLAN:  1. ESRD on dialysis [facility]) Recommend:  At this time the patient does not have appropriate extremity access for dialysis  Patient should have a left brachial axillary graft placement.  The risks, benefits and alternative therapies were reviewed in detail with the patient.  All questions were answered.  The patient agrees to proceed with surgery.    2. Type 2 diabetes mellitus with other specified complication, without long-term current use of insulin (HCC) Continue hypoglycemic medications as already ordered, these medications have been reviewed and there are no changes at this time.  Hgb A1C to be monitored as  already arranged by primary service   3. Major neurocognitive disorder [facility]) Patient readily admitted that he had difficulty with understanding.  His wife is present in order to help consent and make decisions about his medical care.  The patient's wife does consent as well.  4. Essential hypertension Continue antihypertensive medications as already ordered, these medications have been reviewed and there are no changes at this time.    Current Outpatient Medications on File Prior to Visit  Medication Sig Dispense Refill  . aspirin EC 81 MG tablet Take 81 mg by mouth daily.     . B Complex-C-Folic Acid (RENA-VITE RX) 1 MG TABS TAKE 1 TABLET BY MOUTH EVERY DAY IN THE MORNING    . docusate sodium (COLACE) 100 MG capsule Take 200 mg by mouth daily.     Marland Kitchen donepezil (ARICEPT) 10 MG tablet Take 10 mg by mouth at bedtime.     . gabapentin (NEURONTIN) 100 MG capsule Take 100 mg by mouth at bedtime.    . hydrALAZINE (APRESOLINE) 50 MG tablet Take 100 mg by mouth 2 (two) times daily.     . insulin aspart (NOVOLOG FLEXPEN) 100 UNIT/ML FlexPen Inject 10 Units into the skin 2 (two) times daily.     . insulin detemir (LEVEMIR) 100 UNIT/ML injection Inject 20 Units into the skin at bedtime.     Marland Kitchen levothyroxine (SYNTHROID, LEVOTHROID) 137 MCG tablet Take 137 mcg by mouth  daily before breakfast.    . losartan (COZAAR) 25 MG tablet TAKE 1 TABLET BY MOUTH EVERY DAY AT BEDTIME AS DIRECTED    . metoprolol succinate (TOPROL-XL) 50 MG 24 hr tablet Take 50 mg by mouth daily.     Marland Kitchen omeprazole (PRILOSEC) 20 MG capsule Take 20 mg by mouth daily.     . simvastatin (ZOCOR) 20 MG tablet Take 20 mg by mouth daily.     . sitaGLIPtin (JANUVIA) 50 MG tablet Take 50 mg by mouth daily.     Marland Kitchen amLODipine (NORVASC) 10 MG tablet TAKE 1 TABLET BY MOUTH EVERY DAY (Patient not taking: Reported on 01/19/2019) 90 tablet 0  . Cholecalciferol (VITAMIN D3) 5000 units TABS Take 1 tablet by mouth daily.     . furosemide (LASIX) 40 MG tablet Take 40 mg by mouth daily.     . Omega-3 Fatty Acids (FISH OIL) 1000 MG CAPS Take by mouth daily.    Marland Kitchen oxyCODONE-acetaminophen (PERCOCET/ROXICET) 5-325 MG tablet Take 1 tablet by mouth every 6 (six) hours as needed. (Patient not taking: Reported on 01/19/2019) 6 tablet 0   No current facility-administered medications on file prior to visit.     There are no Patient Instructions on file for this visit. No follow-ups on file.   Kris Hartmann, NP  This note was completed with Sales executive.  Any errors are purely unintentional.

## 2019-01-20 ENCOUNTER — Encounter (INDEPENDENT_AMBULATORY_CARE_PROVIDER_SITE_OTHER): Payer: Self-pay

## 2019-01-20 ENCOUNTER — Telehealth (INDEPENDENT_AMBULATORY_CARE_PROVIDER_SITE_OTHER): Payer: Self-pay

## 2019-01-20 NOTE — Telephone Encounter (Signed)
Spoke with the patients relative due to language barrier to schedule the patient for a surgery with Dr. Delana Meyer. Patient is now scheduled for 02/03/2019 surgery. Patient will do his pre-op on 01/26/2019 at 11:30 am and his Covid testing on 01/31/2019 between 12:30-2:30 pm at the [facility]. Pre-surgical instructions will be mailed to the patient.

## 2019-01-23 DIAGNOSIS — E1122 Type 2 diabetes mellitus with diabetic chronic kidney disease: Secondary | ICD-10-CM | POA: Diagnosis not present

## 2019-01-23 DIAGNOSIS — N186 End stage renal disease: Secondary | ICD-10-CM | POA: Diagnosis not present

## 2019-01-23 DIAGNOSIS — Z992 Dependence on renal dialysis: Secondary | ICD-10-CM | POA: Diagnosis not present

## 2019-01-25 ENCOUNTER — Other Ambulatory Visit (INDEPENDENT_AMBULATORY_CARE_PROVIDER_SITE_OTHER): Payer: Self-pay | Admitting: Nurse Practitioner

## 2019-01-25 DIAGNOSIS — N186 End stage renal disease: Secondary | ICD-10-CM | POA: Diagnosis not present

## 2019-01-25 DIAGNOSIS — Z992 Dependence on renal dialysis: Secondary | ICD-10-CM | POA: Diagnosis not present

## 2019-01-25 DIAGNOSIS — D689 Coagulation defect, unspecified: Secondary | ICD-10-CM | POA: Diagnosis not present

## 2019-01-25 DIAGNOSIS — E1140 Type 2 diabetes mellitus with diabetic neuropathy, unspecified: Secondary | ICD-10-CM | POA: Diagnosis not present

## 2019-01-25 DIAGNOSIS — D631 Anemia in chronic kidney disease: Secondary | ICD-10-CM | POA: Diagnosis not present

## 2019-01-25 DIAGNOSIS — E1129 Type 2 diabetes mellitus with other diabetic kidney complication: Secondary | ICD-10-CM | POA: Diagnosis not present

## 2019-01-25 DIAGNOSIS — N2581 Secondary hyperparathyroidism of renal origin: Secondary | ICD-10-CM | POA: Diagnosis not present

## 2019-01-26 ENCOUNTER — Encounter
Admission: RE | Admit: 2019-01-26 | Discharge: 2019-01-26 | Disposition: A | Discharge status: Home / Self Care | Payer: Medicare HMO | Source: Ambulatory Visit | Attending: Vascular Surgery | Admitting: Vascular Surgery

## 2019-01-26 ENCOUNTER — Other Ambulatory Visit: Payer: Self-pay

## 2019-01-26 DIAGNOSIS — Z0184 Encounter for antibody response examination: Secondary | ICD-10-CM | POA: Insufficient documentation

## 2019-01-26 DIAGNOSIS — Z01812 Encounter for preprocedural laboratory examination: Secondary | ICD-10-CM | POA: Insufficient documentation

## 2019-01-26 DIAGNOSIS — Z01810 Encounter for preprocedural cardiovascular examination: Secondary | ICD-10-CM | POA: Insufficient documentation

## 2019-01-26 LAB — CBC WITH DIFFERENTIAL/PLATELET
Abs Immature Granulocytes: 0.14 10*3/uL — ABNORMAL HIGH (ref 0.00–0.07)
Basophils Absolute: 0.1 10*3/uL (ref 0.0–0.1)
Basophils Relative: 0 %
Eosinophils Absolute: 0.3 10*3/uL (ref 0.0–0.5)
Eosinophils Relative: 2 %
HCT: 37.6 % — ABNORMAL LOW (ref 39.0–52.0)
Hemoglobin: 12.4 g/dL — ABNORMAL LOW (ref 13.0–17.0)
Immature Granulocytes: 1 %
Lymphocytes Relative: 14 %
Lymphs Abs: 1.9 10*3/uL (ref 0.7–4.0)
MCH: 30.9 pg (ref 26.0–34.0)
MCHC: 33 g/dL (ref 30.0–36.0)
MCV: 93.8 fL (ref 80.0–100.0)
Monocytes Absolute: 0.7 10*3/uL (ref 0.1–1.0)
Monocytes Relative: 5 %
Neutro Abs: 10.9 10*3/uL — ABNORMAL HIGH (ref 1.7–7.7)
Neutrophils Relative %: 78 %
Platelets: 241 10*3/uL (ref 150–400)
RBC: 4.01 MIL/uL — ABNORMAL LOW (ref 4.22–5.81)
RDW: 13.2 % (ref 11.5–15.5)
WBC: 14 10*3/uL — ABNORMAL HIGH (ref 4.0–10.5)
nRBC: 0 % (ref 0.0–0.2)

## 2019-01-26 LAB — BASIC METABOLIC PANEL
Anion gap: 18 — ABNORMAL HIGH (ref 5–15)
BUN: 34 mg/dL — ABNORMAL HIGH (ref 8–23)
CO2: 24 mmol/L (ref 22–32)
Calcium: 9.6 mg/dL (ref 8.9–10.3)
Chloride: 94 mmol/L — ABNORMAL LOW (ref 98–111)
Creatinine, Ser: 6.7 mg/dL — ABNORMAL HIGH (ref 0.61–1.24)
GFR calc Af Amer: 9 mL/min — ABNORMAL LOW (ref >60)
GFR calc non Af Amer: 7 mL/min — ABNORMAL LOW (ref >60)
Glucose, Bld: 299 mg/dL — ABNORMAL HIGH (ref 70–99)
Potassium: 4.4 mmol/L (ref 3.5–5.1)
Sodium: 136 mmol/L (ref 135–145)

## 2019-01-26 LAB — TYPE AND SCREEN
ABO/RH(D): B POS
Antibody Screen: NEGATIVE

## 2019-01-26 LAB — PROTIME-INR
INR: 0.9 (ref 0.8–1.2)
Prothrombin Time: 12.4 seconds (ref 11.4–15.2)

## 2019-01-26 LAB — APTT: aPTT: 30 seconds (ref 24–36)

## 2019-01-26 NOTE — Patient Instructions (Signed)
Your procedure is scheduled on: Friday 02/03/19  Report to [facility]. To find out your arrival time please call [phone] between 1PM - 3PM on Thursday 02/02/19.   Remember: Instructions that are not followed completely may result in serious medical risk, up to and including death, or upon the discretion of your surgeon and anesthesiologist your surgery may need to be rescheduled.      _X__ 1. Do not eat food after midnight the night before your procedure.                 No gum chewing or hard candies. You may drink water up to 2 hours                 before you are scheduled to arrive for your surgery- DO NOT drink water                 liquids within 2 hours of the start of your surgery.                   __X__2.  On the morning of surgery brush your teeth with toothpaste and water, you may rinse your mouth with mouthwash if you wish.  Do not swallow any toothpaste or mouthwash.       _X__ 3.  Do Not Smoke or use e-cigarettes For 24 Hours Prior to Your Surgery.                 Do not use any chewable tobacco products for at least 6 hours prior to                 surgery.    __X__4.  Notify your doctor if there is any change in your medical condition      (cold, fever, infections).     Do not wear jewelry, make-up, hairpins, clips or nail polish. Do not wear lotions, powders, or perfumes.  Do not shave 48 hours prior to surgery. Men may shave face and neck. Do not bring valuables to the hospital.     [facility] is not responsible for any belongings or valuables.   Contacts, dentures/partials or body piercings may not be worn into surgery. Bring a case for your contacts, glasses or hearing aids, a denture cup will be supplied. .   Patients discharged the day of surgery will not be allowed to drive home.      __X__ Take these medicines the morning of surgery with A SIP OF WATER:     1. levothyroxine (SYNTHROID,  LEVOTHROID) 137 MCG tablet  2. metoprolol succinate (TOPROL-XL) 50 MG 24 hr tablet  3. omeprazole (PRILOSEC) 40 MG capsule       __X__ Use CHG Soap as directed     __X__ Take 1/2 of usual insulin dose the night before surgery. No insulin the morning          of surgery.   __X__ Stop Blood Thinners Coumadin/Plavix/Xarelto/Pleta/Pradaxa/Eliquis/Effient/Aspirin   __X__ Stop Anti-inflammatories 7 days before surgery such as Advil, Ibuprofen, Motrin, BC or Goodies Powder, Naprosyn, Naproxen, Aleve, Aspirin, Meloxicam. May take Tylenol if needed for pain or discomfort.   __X__ Stop the following herbal supplements: fish oil.

## 2019-01-27 NOTE — Pre-Procedure Instructions (Signed)
Met B & CBC sent to Dr. Delana Meyer and Anesthesia for review.  Also requested H&P from Dr. Delana Meyer.

## 2019-01-31 ENCOUNTER — Other Ambulatory Visit
Admission: RE | Admit: 2019-01-31 | Discharge: 2019-01-31 | Disposition: A | Discharge status: Home / Self Care | Payer: Medicare HMO | Source: Ambulatory Visit | Attending: Vascular Surgery | Admitting: Vascular Surgery

## 2019-01-31 ENCOUNTER — Other Ambulatory Visit: Payer: Self-pay

## 2019-01-31 DIAGNOSIS — Z20828 Contact with and (suspected) exposure to other viral communicable diseases: Secondary | ICD-10-CM | POA: Diagnosis not present

## 2019-01-31 DIAGNOSIS — Z01812 Encounter for preprocedural laboratory examination: Secondary | ICD-10-CM | POA: Insufficient documentation

## 2019-02-01 LAB — SARS CORONAVIRUS 2 (TAT 6-24 HRS): SARS Coronavirus 2: NEGATIVE

## 2019-02-02 MED ORDER — CEFAZOLIN SODIUM-DEXTROSE 1-4 GM/50ML-% IV SOLN
1.0000 g | INTRAVENOUS | Status: AC
  Administered 2019-02-03: 1 g via INTRAVENOUS

## 2019-02-03 ENCOUNTER — Encounter
Admission: RE | Disposition: A | Discharge status: Home / Self Care | Payer: Self-pay | Source: Home / Self Care | Attending: Vascular Surgery

## 2019-02-03 ENCOUNTER — Ambulatory Visit
Admission: RE | Admit: 2019-02-03 | Discharge: 2019-02-03 | Disposition: A | Discharge status: Home / Self Care | Payer: Medicare HMO | Attending: Vascular Surgery | Admitting: Vascular Surgery

## 2019-02-03 ENCOUNTER — Ambulatory Visit: Payer: Medicare HMO | Admitting: Certified Registered Nurse Anesthetist

## 2019-02-03 ENCOUNTER — Other Ambulatory Visit: Payer: Self-pay

## 2019-02-03 ENCOUNTER — Encounter: Payer: Self-pay | Admitting: *Deleted

## 2019-02-03 DIAGNOSIS — Z7982 Long term (current) use of aspirin: Secondary | ICD-10-CM | POA: Insufficient documentation

## 2019-02-03 DIAGNOSIS — E1122 Type 2 diabetes mellitus with diabetic chronic kidney disease: Secondary | ICD-10-CM | POA: Insufficient documentation

## 2019-02-03 DIAGNOSIS — Z79890 Hormone replacement therapy (postmenopausal): Secondary | ICD-10-CM | POA: Insufficient documentation

## 2019-02-03 DIAGNOSIS — Z992 Dependence on renal dialysis: Secondary | ICD-10-CM | POA: Insufficient documentation

## 2019-02-03 DIAGNOSIS — F0390 Unspecified dementia without behavioral disturbance: Secondary | ICD-10-CM | POA: Insufficient documentation

## 2019-02-03 DIAGNOSIS — Z794 Long term (current) use of insulin: Secondary | ICD-10-CM | POA: Insufficient documentation

## 2019-02-03 DIAGNOSIS — N186 End stage renal disease: Secondary | ICD-10-CM | POA: Diagnosis not present

## 2019-02-03 DIAGNOSIS — K219 Gastro-esophageal reflux disease without esophagitis: Secondary | ICD-10-CM | POA: Diagnosis not present

## 2019-02-03 DIAGNOSIS — E039 Hypothyroidism, unspecified: Secondary | ICD-10-CM | POA: Insufficient documentation

## 2019-02-03 DIAGNOSIS — E1151 Type 2 diabetes mellitus with diabetic peripheral angiopathy without gangrene: Secondary | ICD-10-CM | POA: Insufficient documentation

## 2019-02-03 DIAGNOSIS — E785 Hyperlipidemia, unspecified: Secondary | ICD-10-CM | POA: Insufficient documentation

## 2019-02-03 DIAGNOSIS — Z79899 Other long term (current) drug therapy: Secondary | ICD-10-CM | POA: Insufficient documentation

## 2019-02-03 DIAGNOSIS — I120 Hypertensive chronic kidney disease with stage 5 chronic kidney disease or end stage renal disease: Secondary | ICD-10-CM | POA: Diagnosis not present

## 2019-02-03 DIAGNOSIS — N185 Chronic kidney disease, stage 5: Secondary | ICD-10-CM | POA: Diagnosis not present

## 2019-02-03 DIAGNOSIS — E782 Mixed hyperlipidemia: Secondary | ICD-10-CM | POA: Diagnosis not present

## 2019-02-03 HISTORY — PX: AV FISTULA PLACEMENT: SHX1204

## 2019-02-03 LAB — POCT I-STAT 4, (NA,K, GLUC, HGB,HCT)
Glucose, Bld: 213 mg/dL — ABNORMAL HIGH (ref 70–99)
HCT: 30 % — ABNORMAL LOW (ref 39.0–52.0)
Hemoglobin: 10.2 g/dL — ABNORMAL LOW (ref 13.0–17.0)
Potassium: 4.1 mmol/L (ref 3.5–5.1)
Sodium: 134 mmol/L — ABNORMAL LOW (ref 135–145)

## 2019-02-03 LAB — GLUCOSE, CAPILLARY
Glucose-Capillary: 275 mg/dL — ABNORMAL HIGH (ref 70–99)
Glucose-Capillary: 271 mg/dL — ABNORMAL HIGH (ref 70–99)

## 2019-02-03 LAB — ABO/RH: ABO/RH(D): B POS

## 2019-02-03 SURGERY — INSERTION OF ARTERIOVENOUS (AV) GORE-TEX GRAFT ARM
Anesthesia: General | Laterality: Left

## 2019-02-03 MED ORDER — DEXAMETHASONE SODIUM PHOSPHATE 10 MG/ML IJ SOLN
INTRAMUSCULAR | Status: AC
  Filled 2019-02-03: qty 1

## 2019-02-03 MED ORDER — SODIUM CHLORIDE 0.9 % IV SOLN
INTRAVENOUS | Status: DC
  Administered 2019-02-03: 09:00:00 via INTRAVENOUS

## 2019-02-03 MED ORDER — BUPIVACAINE LIPOSOME 1.3 % IJ SUSP
INTRAMUSCULAR | Status: AC
  Filled 2019-02-03: qty 20

## 2019-02-03 MED ORDER — SUCCINYLCHOLINE CHLORIDE 20 MG/ML IJ SOLN
INTRAMUSCULAR | Status: DC | PRN
  Administered 2019-02-03: 100 mg via INTRAVENOUS

## 2019-02-03 MED ORDER — HYDROCODONE-ACETAMINOPHEN 5-325 MG PO TABS: 1.0000 | ORAL_TABLET | Freq: Four times a day (QID) | ORAL | 0 refills | Status: DC | PRN

## 2019-02-03 MED ORDER — ONDANSETRON HCL 4 MG/2ML IJ SOLN
INTRAMUSCULAR | Status: DC | PRN
  Administered 2019-02-03: 4 mg via INTRAVENOUS

## 2019-02-03 MED ORDER — ROCURONIUM BROMIDE 100 MG/10ML IV SOLN
INTRAVENOUS | Status: DC | PRN
  Administered 2019-02-03: 40 mg via INTRAVENOUS

## 2019-02-03 MED ORDER — GLYCOPYRROLATE 0.2 MG/ML IJ SOLN
INTRAMUSCULAR | Status: DC | PRN
  Administered 2019-02-03: 0.2 mg via INTRAVENOUS

## 2019-02-03 MED ORDER — HEPARIN SODIUM (PORCINE) 5000 UNIT/ML IJ SOLN
INTRAMUSCULAR | Status: AC
  Filled 2019-02-03: qty 1

## 2019-02-03 MED ORDER — EPHEDRINE SULFATE 50 MG/ML IJ SOLN
INTRAMUSCULAR | Status: DC | PRN
  Administered 2019-02-03 (×6): 5 mg via INTRAVENOUS

## 2019-02-03 MED ORDER — BUPIVACAINE HCL (PF) 0.5 % IJ SOLN
INTRAMUSCULAR | Status: AC
  Filled 2019-02-03: qty 30

## 2019-02-03 MED ORDER — BUPIVACAINE LIPOSOME 1.3 % IJ SUSP
INTRAMUSCULAR | Status: DC | PRN
  Administered 2019-02-03: 35 mL

## 2019-02-03 MED ORDER — PROPOFOL 10 MG/ML IV BOLUS
INTRAVENOUS | Status: DC | PRN
  Administered 2019-02-03: 120 mg via INTRAVENOUS

## 2019-02-03 MED ORDER — PHENYLEPHRINE HCL (PRESSORS) 10 MG/ML IV SOLN
INTRAVENOUS | Status: AC
  Filled 2019-02-03: qty 1

## 2019-02-03 MED ORDER — LIDOCAINE HCL (CARDIAC) PF 100 MG/5ML IV SOSY
PREFILLED_SYRINGE | INTRAVENOUS | Status: DC | PRN
  Administered 2019-02-03: 100 mg via INTRAVENOUS

## 2019-02-03 MED ORDER — FENTANYL CITRATE (PF) 100 MCG/2ML IJ SOLN
INTRAMUSCULAR | Status: DC | PRN
  Administered 2019-02-03: 50 ug via INTRAVENOUS

## 2019-02-03 MED ORDER — ONDANSETRON HCL 4 MG/2ML IJ SOLN: 4.0000 mg | Freq: Once | INTRAMUSCULAR | Status: DC | PRN

## 2019-02-03 MED ORDER — FENTANYL CITRATE (PF) 100 MCG/2ML IJ SOLN
INTRAMUSCULAR | Status: AC
  Filled 2019-02-03: qty 2

## 2019-02-03 MED ORDER — EPHEDRINE SULFATE 50 MG/ML IJ SOLN
INTRAMUSCULAR | Status: AC
  Filled 2019-02-03: qty 1

## 2019-02-03 MED ORDER — PROPOFOL 10 MG/ML IV BOLUS
INTRAVENOUS | Status: AC
  Filled 2019-02-03: qty 20

## 2019-02-03 MED ORDER — CEFAZOLIN SODIUM-DEXTROSE 1-4 GM/50ML-% IV SOLN
INTRAVENOUS | Status: AC
  Filled 2019-02-03: qty 50

## 2019-02-03 MED ORDER — DEXAMETHASONE SODIUM PHOSPHATE 10 MG/ML IJ SOLN
INTRAMUSCULAR | Status: DC | PRN
  Administered 2019-02-03: 4 mg via INTRAVENOUS

## 2019-02-03 MED ORDER — CHLORHEXIDINE GLUCONATE CLOTH 2 % EX PADS: 6.0000 | MEDICATED_PAD | Freq: Once | CUTANEOUS | Status: DC

## 2019-02-03 MED ORDER — LIDOCAINE HCL (PF) 2 % IJ SOLN
INTRAMUSCULAR | Status: AC
  Filled 2019-02-03: qty 10

## 2019-02-03 MED ORDER — ONDANSETRON HCL 4 MG/2ML IJ SOLN
INTRAMUSCULAR | Status: AC
  Filled 2019-02-03: qty 2

## 2019-02-03 MED ORDER — SUGAMMADEX SODIUM 200 MG/2ML IV SOLN
INTRAVENOUS | Status: DC | PRN
  Administered 2019-02-03: 200 mg via INTRAVENOUS

## 2019-02-03 MED ORDER — FENTANYL CITRATE (PF) 100 MCG/2ML IJ SOLN: 25.0000 ug | INTRAMUSCULAR | Status: DC | PRN

## 2019-02-03 MED ORDER — SUCCINYLCHOLINE CHLORIDE 20 MG/ML IJ SOLN
INTRAMUSCULAR | Status: AC
  Filled 2019-02-03: qty 1

## 2019-02-03 SURGICAL SUPPLY — 57 items
APPLIER CLIP 11 MED OPEN (CLIP)
APPLIER CLIP 9.375 SM OPEN (CLIP) ×2
BAG COUNTER SPONGE EZ (MISCELLANEOUS) ×2 IMPLANT
BAG DECANTER FOR FLEXI CONT (MISCELLANEOUS) ×2 IMPLANT
BLADE SURG SZ11 CARB STEEL (BLADE) ×2 IMPLANT
BOOT SUTURE AID YELLOW STND (SUTURE) ×2 IMPLANT
BRUSH SCRUB EZ  4% CHG (MISCELLANEOUS) ×1
BRUSH SCRUB EZ 4% CHG (MISCELLANEOUS) ×1 IMPLANT
CANISTER SUCT 1200ML W/VALVE (MISCELLANEOUS) ×2 IMPLANT
CHLORAPREP W/TINT 26 (MISCELLANEOUS) ×2 IMPLANT
CLIP APPLIE 11 MED OPEN (CLIP) IMPLANT
CLIP APPLIE 9.375 SM OPEN (CLIP) ×1 IMPLANT
COVER WAND RF STERILE (DRAPES) IMPLANT
DERMABOND ADVANCED (GAUZE/BANDAGES/DRESSINGS) ×1
DERMABOND ADVANCED .7 DNX12 (GAUZE/BANDAGES/DRESSINGS) ×1 IMPLANT
DRESSING SURGICEL FIBRLLR 1X2 (HEMOSTASIS) ×1 IMPLANT
DRSG SURGICEL FIBRILLAR 1X2 (HEMOSTASIS) ×2
ELECT CAUTERY BLADE 6.4 (BLADE) ×2 IMPLANT
ELECT REM PT RETURN 9FT ADLT (ELECTROSURGICAL) ×2
ELECTRODE REM PT RTRN 9FT ADLT (ELECTROSURGICAL) ×1 IMPLANT
GLOVE BIO SURGEON STRL SZ7 (GLOVE) ×2 IMPLANT
GLOVE INDICATOR 7.5 STRL GRN (GLOVE) ×2 IMPLANT
GLOVE SURG SYN 8.0 (GLOVE) ×2 IMPLANT
GOWN STRL REUS W/ TWL LRG LVL3 (GOWN DISPOSABLE) ×2 IMPLANT
GOWN STRL REUS W/ TWL XL LVL3 (GOWN DISPOSABLE) ×1 IMPLANT
GOWN STRL REUS W/TWL LRG LVL3 (GOWN DISPOSABLE) ×2
GOWN STRL REUS W/TWL XL LVL3 (GOWN DISPOSABLE) ×1
GRAFT PROPATEN STD WALL 4 7X45 (Vascular Products) ×2 IMPLANT
IV NS 500ML (IV SOLUTION) ×1
IV NS 500ML BAXH (IV SOLUTION) ×1 IMPLANT
KIT TURNOVER KIT A (KITS) ×2 IMPLANT
LABEL OR SOLS (LABEL) ×2 IMPLANT
LOOP RED MAXI  1X406MM (MISCELLANEOUS) ×1
LOOP VESSEL MAXI 1X406 RED (MISCELLANEOUS) ×1 IMPLANT
LOOP VESSEL MINI 0.8X406 BLUE (MISCELLANEOUS) ×2 IMPLANT
LOOPS BLUE MINI 0.8X406MM (MISCELLANEOUS) ×2
NEEDLE FILTER BLUNT 18X 1/2SAF (NEEDLE) ×1
NEEDLE FILTER BLUNT 18X1 1/2 (NEEDLE) ×1 IMPLANT
NS IRRIG 500ML POUR BTL (IV SOLUTION) ×2 IMPLANT
PACK EXTREMITY ARMC (MISCELLANEOUS) ×2 IMPLANT
PAD PREP 24X41 OB/GYN DISP (PERSONAL CARE ITEMS) ×2 IMPLANT
STOCKINETTE STRL 4IN 9604848 (GAUZE/BANDAGES/DRESSINGS) ×2 IMPLANT
SUT GTX CV-6 30 (SUTURE) ×4 IMPLANT
SUT MNCRL+ 5-0 UNDYED PC-3 (SUTURE) ×1 IMPLANT
SUT MONOCRYL 5-0 (SUTURE) ×1
SUT PROLENE 6 0 BV (SUTURE) ×4 IMPLANT
SUT SILK 2 0 (SUTURE) ×1
SUT SILK 2 0 SH (SUTURE) ×2 IMPLANT
SUT SILK 2-0 18XBRD TIE 12 (SUTURE) ×1 IMPLANT
SUT SILK 3 0 (SUTURE) ×1
SUT SILK 3-0 18XBRD TIE 12 (SUTURE) ×1 IMPLANT
SUT SILK 4 0 (SUTURE) ×1
SUT SILK 4-0 18XBRD TIE 12 (SUTURE) ×1 IMPLANT
SUT VIC AB 3-0 SH 27 (SUTURE) ×2
SUT VIC AB 3-0 SH 27X BRD (SUTURE) ×2 IMPLANT
SYR 20ML LL LF (SYRINGE) ×2 IMPLANT
SYR 3ML LL SCALE MARK (SYRINGE) ×2 IMPLANT

## 2019-02-03 NOTE — Anesthesia Preprocedure Evaluation (Signed)
Anesthesia Evaluation  Patient identified by MRN, date of birth, ID band Patient awake    Reviewed: Allergy & Precautions, H&P , NPO status , Patient's Chart, lab work & pertinent test results, reviewed documented beta blocker date and time   History of Anesthesia Complications Negative for: history of anesthetic complications  Airway Mallampati: III  TM Distance: >3 FB Neck ROM: full    Dental  (+) Dental Advidsory Given, Teeth Intact   Pulmonary neg pulmonary ROS,    Pulmonary exam normal        Cardiovascular Exercise Tolerance: Good hypertension, (-) angina+ Peripheral Vascular Disease  (-) Past MI and (-) Cardiac Stents Normal cardiovascular exam(-) dysrhythmias (-) Valvular Problems/Murmurs     Neuro/Psych PSYCHIATRIC DISORDERS Dementia negative neurological ROS     GI/Hepatic Neg liver ROS, GERD  ,  Endo/Other  diabetesHypothyroidism   Renal/GU ESRF and DialysisRenal disease  negative genitourinary   Musculoskeletal   Abdominal   Peds  Hematology negative hematology ROS (+)   Anesthesia Other Findings Past Medical History: No date: Chronic kidney disease No date: Clotting disorder (HCC)     Comment:  per daughter, "when patient travels, his feet swell" No date: Diabetes mellitus without complication (HCC) No date: GERD (gastroesophageal reflux disease) No date: Hyperlipidemia No date: Hypertension No date: Hypothyroidism No date: Thyroid disease   Reproductive/Obstetrics negative OB ROS                             Anesthesia Physical Anesthesia Plan  ASA: IV  Anesthesia Plan: General   Post-op Pain Management:    Induction: Intravenous  PONV Risk Score and Plan: 2 and Ondansetron, Dexamethasone, Midazolam and Treatment may vary due to age or medical condition  Airway Management Planned: LMA and Oral ETT  Additional Equipment:   Intra-op Plan:   Post-operative  Plan: Extubation in OR  Informed Consent: I have reviewed the patients History and Physical, chart, labs and discussed the procedure including the risks, benefits and alternatives for the proposed anesthesia with the patient or authorized representative who has indicated his/her understanding and acceptance.     Dental Advisory Given  Plan Discussed with: Anesthesiologist, CRNA and Surgeon  Anesthesia Plan Comments:         Anesthesia Quick Evaluation

## 2019-02-03 NOTE — Discharge Instructions (Signed)

## 2019-02-03 NOTE — Transfer of Care (Signed)
Immediate Anesthesia Transfer of Care Note  Patient: Joseph Mcpherson  Procedure(s) Performed: INSERTION OF ARTERIOVENOUS (AV) GORE-TEX GRAFT ARM (BRACHIAL AXILLARY ) (Left )  Patient Location: PACU  Anesthesia Type:General  Level of Consciousness: awake, drowsy and patient cooperative  Airway & Oxygen Therapy: Patient Spontanous Breathing and Patient connected to face mask oxygen  Post-op Assessment: Report given to RN and Post -op Vital signs reviewed and stable  Post vital signs: Reviewed and stable  Last Vitals:  Vitals Value Taken Time  BP 129/81 02/03/19 1233  Temp 36.2 C 02/03/19 1232  Pulse 73 02/03/19 1234  Resp 20 02/03/19 1234  SpO2 100 % 02/03/19 1234  Vitals shown include unvalidated device data.  Last Pain:  Vitals:   02/03/19 0841  TempSrc: Temporal  PainSc: 0-No pain         Complications: No apparent anesthesia complications

## 2019-02-03 NOTE — Anesthesia Postprocedure Evaluation (Signed)
Anesthesia Post Note  Patient: Joseph Mcpherson  Procedure(s) Performed: INSERTION OF ARTERIOVENOUS (AV) GORE-TEX GRAFT ARM (BRACHIAL AXILLARY ) (Left )  Patient location during evaluation: PACU Anesthesia Type: General Level of consciousness: awake and alert Pain management: pain level controlled Vital Signs Assessment: post-procedure vital signs reviewed and stable Respiratory status: spontaneous breathing, nonlabored ventilation, respiratory function stable and patient connected to nasal cannula oxygen Cardiovascular status: blood pressure returned to baseline and stable Postop Assessment: no apparent nausea or vomiting Anesthetic complications: no     Last Vitals:  Vitals:   02/03/19 1331 02/03/19 1351  BP: (!) 153/44 (!) 143/40  Pulse: 71 70  Resp: 16 16  Temp: (!) 36.1 C   SpO2: 100% 100%    Last Pain:  Vitals:   02/03/19 1351  TempSrc:   PainSc: 0-No pain                 Martha Clan

## 2019-02-03 NOTE — Op Note (Signed)
OPERATIVE NOTE   PROCEDURE: left brachial axillary arteriovenous graft placement  PRE-OPERATIVE DIAGNOSIS: End Stage Renal Disease  POST-OPERATIVE DIAGNOSIS: End Stage Renal Disease  SURGEON: Hortencia Pilar  ASSISTANT(S): Ms. Hezzie Bump  ANESTHESIA: general  ESTIMATED BLOOD LOSS: <50 cc  FINDING(S): 4 mm brachial artery with a 6 mm axillary vein  SPECIMEN(S):  none  INDICATIONS:   Joseph Mcpherson is a 73 y.o. male who presents with end stage renal disease.  The patient is scheduled for left brachial axillary AV graft placement.  The patient is aware the risks include but are not limited to: bleeding, infection, steal syndrome, nerve damage, ischemic monomelic neuropathy, failure to mature, and need for additional procedures.  The patient is aware of the risks of the procedure and elects to proceed forward.  DESCRIPTION: After full informed written consent was obtained from the patient, the patient was brought back to the operating room and placed supine upon the operating table.  Prior to induction, the patient received IV antibiotics.   After obtaining adequate anesthesia, the patient was then prepped and draped in the standard fashion for a left arm access procedure.   A first assistant was required to provide a safe and appropriate environment for executing the surgery.  The assistant was integral in providing retraction, exposure, running suture providing suction and in the closing process.   A linear incision was then created along the medial border of the biceps muscle just proximal to the antecubital crease and the brachial artery which was exposed through. The brachial artery was then looped proximally and distally with Silastic Vesseloops. Side branches were controlled with 4-0 silk ties.  Attention was then turned to the exposure of the axillary vein. Linear incision was then created medial to the proximal portion of the biceps at the level of the anterior axillary  crease. The axillary vein was exposed and again looped proximally and distally with Silastic vessel loops. Associated tributaries were also controlled with Silastic Vesseloops.  The Gore tunneler was then delivered onto the field and a subcutaneous path was made from the arterial incision to the venous incision. A 4-7 tapered PTFE propatent graft by Simeon Craft was then pulled through the subcutaneous tunnel. The arterial 4 mm portion was then approximated to the brachial artery. Brachial artery was controlled proximally and distally with the Silastic Vesseloops. Arteriotomy was made with an 11 blade scalpel and extended with Potts scissors and a 6-0 Prolene stay suture was placed. End graft to side brachial artery anastomosis was then fashioned with running CV 6 suture. Flushing maneuvers were performed suture line was hemostatic and the graft was then assessed for proper position and ease of future cannulation. Heparinized saline was infused into the vein and the graft was clamped with a vascular clamp. With the graft pressurized it was approximated to the axillary vein in its native bed and then marked with a surgical marker. The vein was then delivered into the surgical field and controlled with the Silastic vessel loops. Venotomy was then made with an 11 blade scalpel and extended with Potts scissors and a 6-0 Prolene suture was used as stay suture. The the graft was then sewn to the vein in an end graft to side vein fashion using running CV 6 suture.  Flushing maneuvers were performed and the artery was allowed to forward and back bleed.  Flow was then established through the AV graft  There was good  thrill in the venous outflow, and there was 1+ palpable  radial pulse.  At this point, I irrigated out the surgical wounds.  There was no further active bleeding.  The subcutaneous tissue was reapproximated with a running stitch of 3-0 Vicryl.  The skin was then reapproximated with a running subcuticular stitch of  4-0 Vicryl.  The skin was then cleaned, dried, and reinforced with Dermabond.    The patient tolerated this procedure well.   COMPLICATIONS: None  CONDITION: Joseph Mcpherson [facility]  Office: [phone]   02/03/2019, 12:19 PM

## 2019-02-03 NOTE — Anesthesia Post-op Follow-up Note (Signed)
Anesthesia QCDR form completed.        

## 2019-02-03 NOTE — H&P (Signed)
[facility] History & Physical Update  The patient was interviewed and re-examined.  The patient's previous History and Physical has been reviewed and is unchanged.  There is no change in the plan of care. We plan to proceed with the scheduled procedure.  Hortencia Pilar, MD  02/03/2019, 9:53 AM

## 2019-02-03 NOTE — Anesthesia Procedure Notes (Addendum)
Procedure Name: Intubation Date/Time: 02/03/2019 10:23 AM Performed by: Lowry Bowl, CRNA Pre-anesthesia Checklist: Patient identified, Emergency Drugs available, Suction available and Patient being monitored Patient Re-evaluated:Patient Re-evaluated prior to induction Oxygen Delivery Method: Circle system utilized Preoxygenation: Pre-oxygenation with 100% oxygen Induction Type: IV induction and Cricoid Pressure applied Ventilation: Mask ventilation without difficulty Laryngoscope Size: McGraph and 4 Grade View: Grade II Tube type: Oral Tube size: 7.5 mm Number of attempts: 1 Airway Equipment and Method: Stylet and Video-laryngoscopy Placement Confirmation: ETT inserted through vocal cords under direct vision,  positive ETCO2 and breath sounds checked- equal and bilateral Secured at: 22 cm Tube secured with: Tape Dental Injury: Teeth and Oropharynx as per pre-operative assessment

## 2019-02-09 DIAGNOSIS — E119 Type 2 diabetes mellitus without complications: Secondary | ICD-10-CM | POA: Diagnosis not present

## 2019-02-09 DIAGNOSIS — Z992 Dependence on renal dialysis: Secondary | ICD-10-CM | POA: Diagnosis not present

## 2019-02-09 DIAGNOSIS — M159 Polyosteoarthritis, unspecified: Secondary | ICD-10-CM | POA: Diagnosis not present

## 2019-02-09 DIAGNOSIS — N186 End stage renal disease: Secondary | ICD-10-CM | POA: Diagnosis not present

## 2019-02-09 DIAGNOSIS — Z23 Encounter for immunization: Secondary | ICD-10-CM | POA: Diagnosis not present

## 2019-02-17 DIAGNOSIS — E1129 Type 2 diabetes mellitus with other diabetic kidney complication: Secondary | ICD-10-CM | POA: Diagnosis not present

## 2019-02-17 DIAGNOSIS — Z992 Dependence on renal dialysis: Secondary | ICD-10-CM | POA: Diagnosis not present

## 2019-02-17 DIAGNOSIS — E1140 Type 2 diabetes mellitus with diabetic neuropathy, unspecified: Secondary | ICD-10-CM | POA: Diagnosis not present

## 2019-02-17 DIAGNOSIS — N2581 Secondary hyperparathyroidism of renal origin: Secondary | ICD-10-CM | POA: Diagnosis not present

## 2019-02-17 DIAGNOSIS — N186 End stage renal disease: Secondary | ICD-10-CM | POA: Diagnosis not present

## 2019-02-17 DIAGNOSIS — D689 Coagulation defect, unspecified: Secondary | ICD-10-CM | POA: Diagnosis not present

## 2019-02-17 DIAGNOSIS — D631 Anemia in chronic kidney disease: Secondary | ICD-10-CM | POA: Diagnosis not present

## 2019-02-22 ENCOUNTER — Other Ambulatory Visit (INDEPENDENT_AMBULATORY_CARE_PROVIDER_SITE_OTHER): Payer: Self-pay | Admitting: Vascular Surgery

## 2019-02-22 DIAGNOSIS — Z992 Dependence on renal dialysis: Secondary | ICD-10-CM | POA: Diagnosis not present

## 2019-02-22 DIAGNOSIS — Z9889 Other specified postprocedural states: Secondary | ICD-10-CM

## 2019-02-22 DIAGNOSIS — E1122 Type 2 diabetes mellitus with diabetic chronic kidney disease: Secondary | ICD-10-CM | POA: Diagnosis not present

## 2019-02-22 DIAGNOSIS — Z95828 Presence of other vascular implants and grafts: Secondary | ICD-10-CM

## 2019-02-22 DIAGNOSIS — N186 End stage renal disease: Secondary | ICD-10-CM | POA: Diagnosis not present

## 2019-02-23 ENCOUNTER — Encounter: Payer: Self-pay | Admitting: Podiatry

## 2019-02-23 ENCOUNTER — Other Ambulatory Visit: Payer: Self-pay

## 2019-02-23 ENCOUNTER — Ambulatory Visit: Payer: Medicare HMO | Admitting: Podiatry

## 2019-02-23 ENCOUNTER — Ambulatory Visit (INDEPENDENT_AMBULATORY_CARE_PROVIDER_SITE_OTHER): Payer: Medicare HMO

## 2019-02-23 ENCOUNTER — Ambulatory Visit (INDEPENDENT_AMBULATORY_CARE_PROVIDER_SITE_OTHER): Payer: Medicare HMO | Admitting: Nurse Practitioner

## 2019-02-23 DIAGNOSIS — L600 Ingrowing nail: Secondary | ICD-10-CM | POA: Diagnosis not present

## 2019-02-23 DIAGNOSIS — M79675 Pain in left toe(s): Secondary | ICD-10-CM

## 2019-02-23 DIAGNOSIS — B351 Tinea unguium: Secondary | ICD-10-CM

## 2019-02-23 MED ORDER — CEPHALEXIN 500 MG PO CAPS: 500.0000 mg | ORAL_CAPSULE | Freq: Two times a day (BID) | ORAL | 0 refills | Status: DC

## 2019-02-23 NOTE — Progress Notes (Signed)
This patient presents the office for diabetic foot exam as well as stating he has pain on the inside and outside borders of the big toe left foot.  He says that the ingrown toenails have been painful for the last 3 to 5 months when he wear shoes.  He previously was seen 1 year ago and had nail surgery for the correction of ingrowing toenails inside and outside borders  right great toe.  He says they have healed well and no pain is noted.  Patient presents the office today with his wife for an evaluation and treatment.  He presents the office today for diabetic foot exam and requests nail surgery for the removal of the inside and outside borders of the left big toe. His wife says his sugar was 98 this morning.  Vascular  Dorsalis pedis and posterior tibial pulses are palpable  B/L.  Capillary return  WNL.  Temperature gradient is  WNL.  Skin turgor  WNL  Sensorium  Senn Weinstein monofilament wire  WNL. Normal tactile sensation.  Nail Exam  Patient has normal nails with no evidence of bacterial or fungal infection 2-5  B/L.  Marked incurvation noted medial and lateral borders left great toe.  Examination of right hallux reveals absence of medial and lateral borders right great toenail.  Orthopedic  Exam  Muscle tone and muscle strength  WNL.  No limitations of motion feet  B/L.  No crepitus or joint effusion noted.  Foot type is unremarkable and digits show no abnormalities.  Bony prominences are unremarkable.  Skin  No open lesions.  Normal skin texture and turgor.  Ingrown Toenail Medial and lateral border left great toe.  Diabetic foot exam.  ROV.  Diabetic exam reveals no vascular or neurological pathology.  Nail surgery.  Treatment options and alternatives discussed.  Recommended permanent phenol matrixectomy and patient agreed.  Left hallux  was prepped with alcohol and a toe block of 3cc of 2% lidocaine plain was administered in a digital toe block. .  The toe was then prepped with betadine  solution . A tourniquet was applied to toe. The offending nail borders were  then excised and matrix tissue exposed.  Phenol was then applied to the matrix tissue followed by an alcohol wash.  Antibiotic ointment and a dry sterile dressing was applied.  The patient was dispensed instructions for aftercare. Prescribe cephalexin prophlactically.  RTC 1 week.     Gardiner Barefoot DPM

## 2019-02-23 NOTE — Patient Instructions (Signed)

## 2019-02-24 DIAGNOSIS — D689 Coagulation defect, unspecified: Secondary | ICD-10-CM | POA: Diagnosis not present

## 2019-02-24 DIAGNOSIS — E1129 Type 2 diabetes mellitus with other diabetic kidney complication: Secondary | ICD-10-CM | POA: Diagnosis not present

## 2019-02-24 DIAGNOSIS — D509 Iron deficiency anemia, unspecified: Secondary | ICD-10-CM | POA: Diagnosis not present

## 2019-02-24 DIAGNOSIS — D631 Anemia in chronic kidney disease: Secondary | ICD-10-CM | POA: Diagnosis not present

## 2019-02-24 DIAGNOSIS — N186 End stage renal disease: Secondary | ICD-10-CM | POA: Diagnosis not present

## 2019-02-24 DIAGNOSIS — N2581 Secondary hyperparathyroidism of renal origin: Secondary | ICD-10-CM | POA: Diagnosis not present

## 2019-02-24 DIAGNOSIS — E1140 Type 2 diabetes mellitus with diabetic neuropathy, unspecified: Secondary | ICD-10-CM | POA: Diagnosis not present

## 2019-02-24 DIAGNOSIS — Z992 Dependence on renal dialysis: Secondary | ICD-10-CM | POA: Diagnosis not present

## 2019-03-06 ENCOUNTER — Telehealth (INDEPENDENT_AMBULATORY_CARE_PROVIDER_SITE_OTHER): Payer: Self-pay

## 2019-03-07 ENCOUNTER — Encounter (INDEPENDENT_AMBULATORY_CARE_PROVIDER_SITE_OTHER): Payer: Medicare HMO

## 2019-03-07 ENCOUNTER — Ambulatory Visit (INDEPENDENT_AMBULATORY_CARE_PROVIDER_SITE_OTHER): Payer: Medicare HMO | Admitting: Nurse Practitioner

## 2019-03-07 NOTE — Telephone Encounter (Signed)
Patient call and left a message for a return call. I attempted to contact the patient and a message was left for a return call.

## 2019-03-09 ENCOUNTER — Ambulatory Visit: Payer: Medicare HMO | Admitting: Podiatry

## 2019-03-09 ENCOUNTER — Encounter: Payer: Self-pay | Admitting: Podiatry

## 2019-03-09 ENCOUNTER — Other Ambulatory Visit: Payer: Self-pay

## 2019-03-09 DIAGNOSIS — Z09 Encounter for follow-up examination after completed treatment for conditions other than malignant neoplasm: Secondary | ICD-10-CM

## 2019-03-09 NOTE — Progress Notes (Signed)
This patient returns to the office following nail surgery two weeks  ago.  The patient says toe has been soaked and bandaged as directed.  There has been improvement of the toe since the surgery has been performed. The patient presents for continued evaluation and treatment. Patient says he has had only one painful day in two weeks.  GENERAL APPEARANCE: Alert, conversant. Appropriately groomed. No acute distress.  VASCULAR: Pedal pulses palpable at  [facility] and PT bilateral.  Capillary refill time is immediate to all digits,  Normal temperature gradient.    NEUROLOGIC: sensation is normal to 5.07 monofilament at 5/5 sites bilateral.  Light touch is intact bilateral, Muscle strength normal.  MUSCULOSKELETAL: acceptable muscle strength, tone and stability bilateral.  Intrinsic muscluature intact bilateral.  Rectus appearance of foot and digits noted bilateral.   DERMATOLOGIC: skin color, texture, and turgor are within normal limits.  No preulcerative lesions or ulcers  are seen, no interdigital maceration noted.   NAILS  There is necrotic tissue along the nail groove  In the absence of redness swelling and pain.  DX  S/p nail surgery  ROV  Home instructions were discussed.  Patient to call the office if there are any questions or concerns.   Gardiner Barefoot DPM

## 2019-03-17 DIAGNOSIS — E113393 Type 2 diabetes mellitus with moderate nonproliferative diabetic retinopathy without macular edema, bilateral: Secondary | ICD-10-CM | POA: Diagnosis not present

## 2019-03-25 DIAGNOSIS — Z992 Dependence on renal dialysis: Secondary | ICD-10-CM | POA: Diagnosis not present

## 2019-03-25 DIAGNOSIS — N186 End stage renal disease: Secondary | ICD-10-CM | POA: Diagnosis not present

## 2019-03-25 DIAGNOSIS — E1122 Type 2 diabetes mellitus with diabetic chronic kidney disease: Secondary | ICD-10-CM | POA: Diagnosis not present

## 2019-03-27 DIAGNOSIS — D631 Anemia in chronic kidney disease: Secondary | ICD-10-CM | POA: Diagnosis not present

## 2019-03-27 DIAGNOSIS — E1140 Type 2 diabetes mellitus with diabetic neuropathy, unspecified: Secondary | ICD-10-CM | POA: Diagnosis not present

## 2019-03-27 DIAGNOSIS — D509 Iron deficiency anemia, unspecified: Secondary | ICD-10-CM | POA: Diagnosis not present

## 2019-03-27 DIAGNOSIS — D689 Coagulation defect, unspecified: Secondary | ICD-10-CM | POA: Diagnosis not present

## 2019-03-27 DIAGNOSIS — N2581 Secondary hyperparathyroidism of renal origin: Secondary | ICD-10-CM | POA: Diagnosis not present

## 2019-03-27 DIAGNOSIS — E1129 Type 2 diabetes mellitus with other diabetic kidney complication: Secondary | ICD-10-CM | POA: Diagnosis not present

## 2019-03-27 DIAGNOSIS — Z992 Dependence on renal dialysis: Secondary | ICD-10-CM | POA: Diagnosis not present

## 2019-03-27 DIAGNOSIS — N186 End stage renal disease: Secondary | ICD-10-CM | POA: Diagnosis not present

## 2019-04-04 ENCOUNTER — Telehealth (INDEPENDENT_AMBULATORY_CARE_PROVIDER_SITE_OTHER): Payer: Self-pay

## 2019-04-04 ENCOUNTER — Other Ambulatory Visit (INDEPENDENT_AMBULATORY_CARE_PROVIDER_SITE_OTHER): Payer: Self-pay | Admitting: Nurse Practitioner

## 2019-04-04 NOTE — Telephone Encounter (Signed)
Patient had surgery on 02/03/2019 and Sheryl called to see when they start cannulating  the patient for dialysis. I spoke with Eulogio Ditch NP and she recommended for the patient to reschedule missed appointment for his follow up after procedure . Sheryl was made aware with information and will inform the patient to call the office.

## 2019-04-18 DIAGNOSIS — N2581 Secondary hyperparathyroidism of renal origin: Secondary | ICD-10-CM | POA: Diagnosis not present

## 2019-04-18 DIAGNOSIS — E1140 Type 2 diabetes mellitus with diabetic neuropathy, unspecified: Secondary | ICD-10-CM | POA: Diagnosis not present

## 2019-04-18 DIAGNOSIS — D631 Anemia in chronic kidney disease: Secondary | ICD-10-CM | POA: Diagnosis not present

## 2019-04-18 DIAGNOSIS — Z992 Dependence on renal dialysis: Secondary | ICD-10-CM | POA: Diagnosis not present

## 2019-04-18 DIAGNOSIS — D689 Coagulation defect, unspecified: Secondary | ICD-10-CM | POA: Diagnosis not present

## 2019-04-18 DIAGNOSIS — N186 End stage renal disease: Secondary | ICD-10-CM | POA: Diagnosis not present

## 2019-04-18 DIAGNOSIS — E1129 Type 2 diabetes mellitus with other diabetic kidney complication: Secondary | ICD-10-CM | POA: Diagnosis not present

## 2019-04-18 DIAGNOSIS — D509 Iron deficiency anemia, unspecified: Secondary | ICD-10-CM | POA: Diagnosis not present

## 2019-04-24 DIAGNOSIS — N186 End stage renal disease: Secondary | ICD-10-CM | POA: Diagnosis not present

## 2019-04-24 DIAGNOSIS — E1122 Type 2 diabetes mellitus with diabetic chronic kidney disease: Secondary | ICD-10-CM | POA: Diagnosis not present

## 2019-04-24 DIAGNOSIS — Z992 Dependence on renal dialysis: Secondary | ICD-10-CM | POA: Diagnosis not present

## 2019-04-26 DIAGNOSIS — E1129 Type 2 diabetes mellitus with other diabetic kidney complication: Secondary | ICD-10-CM | POA: Diagnosis not present

## 2019-04-26 DIAGNOSIS — D689 Coagulation defect, unspecified: Secondary | ICD-10-CM | POA: Diagnosis not present

## 2019-04-26 DIAGNOSIS — E1140 Type 2 diabetes mellitus with diabetic neuropathy, unspecified: Secondary | ICD-10-CM | POA: Diagnosis not present

## 2019-04-26 DIAGNOSIS — N2581 Secondary hyperparathyroidism of renal origin: Secondary | ICD-10-CM | POA: Diagnosis not present

## 2019-04-26 DIAGNOSIS — D631 Anemia in chronic kidney disease: Secondary | ICD-10-CM | POA: Diagnosis not present

## 2019-04-26 DIAGNOSIS — N186 End stage renal disease: Secondary | ICD-10-CM | POA: Diagnosis not present

## 2019-04-26 DIAGNOSIS — Z992 Dependence on renal dialysis: Secondary | ICD-10-CM | POA: Diagnosis not present

## 2019-04-26 DIAGNOSIS — D509 Iron deficiency anemia, unspecified: Secondary | ICD-10-CM | POA: Diagnosis not present

## 2019-04-27 ENCOUNTER — Encounter (INDEPENDENT_AMBULATORY_CARE_PROVIDER_SITE_OTHER): Payer: Medicare HMO

## 2019-04-27 ENCOUNTER — Ambulatory Visit (INDEPENDENT_AMBULATORY_CARE_PROVIDER_SITE_OTHER): Payer: Medicare HMO | Admitting: Nurse Practitioner

## 2019-04-27 ENCOUNTER — Other Ambulatory Visit: Payer: Self-pay

## 2019-04-27 ENCOUNTER — Ambulatory Visit (INDEPENDENT_AMBULATORY_CARE_PROVIDER_SITE_OTHER): Payer: Medicare HMO | Admitting: Vascular Surgery

## 2019-04-27 ENCOUNTER — Encounter (INDEPENDENT_AMBULATORY_CARE_PROVIDER_SITE_OTHER): Payer: Self-pay | Admitting: Vascular Surgery

## 2019-04-27 VITALS — BP 130/73 | HR 65 | Resp 18 | Ht 66.0 in | Wt 150.0 lb

## 2019-04-27 DIAGNOSIS — T829XXS Unspecified complication of cardiac and vascular prosthetic device, implant and graft, sequela: Secondary | ICD-10-CM

## 2019-04-27 DIAGNOSIS — N186 End stage renal disease: Secondary | ICD-10-CM

## 2019-04-27 DIAGNOSIS — T829XXA Unspecified complication of cardiac and vascular prosthetic device, implant and graft, initial encounter: Secondary | ICD-10-CM | POA: Insufficient documentation

## 2019-04-27 NOTE — Progress Notes (Signed)
Patient ID: Joseph Mcpherson, male   DOB: 26-Jul-1945, 73 y.o.   MRN: [id number]  Chief Complaint  Patient presents with  . Follow-up    HPI Joseph Mcpherson is a 73 y.o. male.    The patient returns to the office for followup of their dialysis access. The function of the access has been stable.  The patient denies hand pain or other symptoms consistent with steal phenomena.  No significant arm swelling.  The patient denies redness or swelling at the access site. The patient denies fever or chills at home or while on dialysis.  The patient denies amaurosis fugax or recent TIA symptoms. There are no recent neurological changes noted. The patient denies claudication symptoms or rest pain symptoms. The patient denies history of DVT, PE or superficial thrombophlebitis. The patient denies recent episodes of angina or shortness of breath.      Past Medical History:  Diagnosis Date  . Chronic kidney disease   . Clotting disorder ([place])    per daughter, "when patient travels, his feet swell"  . Diabetes mellitus without complication ([place])   . GERD (gastroesophageal reflux disease)   . Hyperlipidemia   . Hypertension   . Hypothyroidism   . Thyroid disease     Past Surgical History:  Procedure Laterality Date  . AV FISTULA PLACEMENT Right 10/22/2017   Procedure: ARTERIOVENOUS (AV) BRACHIOCEPHALIC FISTULA CREATION RIGHT UPPER ARM;  Surgeon: Conrad Hiddenite, MD;  Location: [facility];  Service: Vascular;  Laterality: Right;  . AV FISTULA PLACEMENT Left 02/03/2019   Procedure: INSERTION OF ARTERIOVENOUS (AV) GORE-TEX GRAFT ARM (BRACHIAL AXILLARY );  Surgeon: [name], MD;  Location: [facility];  Service: Vascular;  Laterality: Left;      No Known Allergies  Current Outpatient Medications  Medication Sig Dispense Refill  . aspirin EC 81 MG tablet Take 81 mg by mouth daily.     . B Complex-C-Folic Acid (RENA-VITE RX) 1 MG TABS Take 1 tablet by mouth daily.     . Cholecalciferol (VITAMIN D3)  5000 units TABS Take 5,000 Units by mouth daily.     Marland Kitchen docusate sodium (COLACE) 100 MG capsule Take 100 mg by mouth 2 (two) times daily.     Marland Kitchen donepezil (ARICEPT) 10 MG tablet Take 10 mg by mouth at bedtime.     . gabapentin (NEURONTIN) 300 MG capsule Take 300 mg by mouth at bedtime.     . hydrALAZINE (APRESOLINE) 50 MG tablet Take 50 mg by mouth 3 (three) times daily.     Marland Kitchen HYDROcodone-acetaminophen (NORCO) 5-325 MG tablet Take 1-2 tablets by mouth every 6 (six) hours as needed for moderate pain or severe pain. 25 tablet 0  . insulin aspart (NOVOLOG FLEXPEN) 100 UNIT/ML FlexPen Inject 8 Units into the skin 2 (two) times daily.     . insulin detemir (LEVEMIR) 100 UNIT/ML injection Inject 16 Units into the skin at bedtime.     Marland Kitchen levothyroxine (SYNTHROID, LEVOTHROID) 137 MCG tablet Take 137 mcg by mouth daily before breakfast.    . lidocaine-prilocaine (EMLA) cream EMLA 2.5-2.5%    . losartan (COZAAR) 25 MG tablet Take 25 mg by mouth every evening.     . metoprolol succinate (TOPROL-XL) 50 MG 24 hr tablet Take 50 mg by mouth daily.     Marland Kitchen omeprazole (PRILOSEC) 40 MG capsule Take 40 mg by mouth daily.     . simvastatin (ZOCOR) 20 MG tablet Take 20 mg by mouth every evening.     Marland Kitchen  sitaGLIPtin (JANUVIA) 50 MG tablet Take 50 mg by mouth every evening.      No current facility-administered medications for this visit.         Physical Exam BP 130/73 (BP Location: Right Arm)   Pulse 65   Resp 18   Ht 5\' 6"  (1.676 m)   Wt 150 lb (68 kg)   BMI 24.21 kg/m  Gen:  WD/WN, NAD Skin: incision C/D/I; left arm good thrill good bruit     Assessment/Plan: 1. End stage renal disease ([place]) At the present time the patient has adequate dialysis access.  Continue hemodialysis as ordered without interruption.  Avoid nephrotoxic medications and dehydration.  Further plans per nephrology  2. Complication of vascular access for dialysis, sequela Recommend:  The patient is doing well and currently  has adequate dialysis access. The patient's dialysis center is not reporting any access issues. Flow pattern is stable when compared to the prior ultrasound.  The patient should have a duplex ultrasound of the dialysis access in 6 months. The patient will follow-up with me in the office after each ultrasound    - DIALYSIS ACCESS (AVF; Future      Hortencia Pilar 04/27/2019, 10:58 AM   This note was created with Dragon medical transcription system.  Any errors from dictation are unintentional.

## 2019-05-04 ENCOUNTER — Inpatient Hospital Stay: Payer: Medicare HMO

## 2019-05-04 ENCOUNTER — Other Ambulatory Visit: Payer: Self-pay

## 2019-05-04 ENCOUNTER — Emergency Department: Payer: Medicare HMO

## 2019-05-04 ENCOUNTER — Inpatient Hospital Stay
Admission: EM | Admit: 2019-05-04 | Discharge: 2019-05-06 | DRG: 064 | Disposition: A | Discharge status: Home Health | Payer: Medicare HMO | Attending: Internal Medicine | Admitting: Internal Medicine

## 2019-05-04 ENCOUNTER — Encounter: Payer: Self-pay | Admitting: Intensive Care

## 2019-05-04 DIAGNOSIS — Z79890 Hormone replacement therapy (postmenopausal): Secondary | ICD-10-CM

## 2019-05-04 DIAGNOSIS — Z794 Long term (current) use of insulin: Secondary | ICD-10-CM

## 2019-05-04 DIAGNOSIS — F0280 Dementia in other diseases classified elsewhere without behavioral disturbance: Secondary | ICD-10-CM | POA: Diagnosis present

## 2019-05-04 DIAGNOSIS — Z992 Dependence on renal dialysis: Secondary | ICD-10-CM | POA: Diagnosis not present

## 2019-05-04 DIAGNOSIS — E039 Hypothyroidism, unspecified: Secondary | ICD-10-CM | POA: Diagnosis present

## 2019-05-04 DIAGNOSIS — E785 Hyperlipidemia, unspecified: Secondary | ICD-10-CM | POA: Diagnosis present

## 2019-05-04 DIAGNOSIS — Z833 Family history of diabetes mellitus: Secondary | ICD-10-CM | POA: Diagnosis not present

## 2019-05-04 DIAGNOSIS — Z8349 Family history of other endocrine, nutritional and metabolic diseases: Secondary | ICD-10-CM | POA: Diagnosis not present

## 2019-05-04 DIAGNOSIS — Z20828 Contact with and (suspected) exposure to other viral communicable diseases: Secondary | ICD-10-CM | POA: Diagnosis present

## 2019-05-04 DIAGNOSIS — Z8673 Personal history of transient ischemic attack (TIA), and cerebral infarction without residual deficits: Secondary | ICD-10-CM | POA: Diagnosis not present

## 2019-05-04 DIAGNOSIS — E782 Mixed hyperlipidemia: Secondary | ICD-10-CM | POA: Diagnosis present

## 2019-05-04 DIAGNOSIS — H9193 Unspecified hearing loss, bilateral: Secondary | ICD-10-CM | POA: Diagnosis present

## 2019-05-04 DIAGNOSIS — D631 Anemia in chronic kidney disease: Secondary | ICD-10-CM | POA: Diagnosis not present

## 2019-05-04 DIAGNOSIS — I120 Hypertensive chronic kidney disease with stage 5 chronic kidney disease or end stage renal disease: Secondary | ICD-10-CM | POA: Diagnosis present

## 2019-05-04 DIAGNOSIS — G301 Alzheimer's disease with late onset: Secondary | ICD-10-CM | POA: Diagnosis present

## 2019-05-04 DIAGNOSIS — I639 Cerebral infarction, unspecified: Secondary | ICD-10-CM | POA: Diagnosis not present

## 2019-05-04 DIAGNOSIS — R29702 NIHSS score 2: Secondary | ICD-10-CM | POA: Diagnosis present

## 2019-05-04 DIAGNOSIS — N186 End stage renal disease: Secondary | ICD-10-CM | POA: Diagnosis not present

## 2019-05-04 DIAGNOSIS — R42 Dizziness and giddiness: Secondary | ICD-10-CM | POA: Diagnosis not present

## 2019-05-04 DIAGNOSIS — N2581 Secondary hyperparathyroidism of renal origin: Secondary | ICD-10-CM | POA: Diagnosis not present

## 2019-05-04 DIAGNOSIS — Z7982 Long term (current) use of aspirin: Secondary | ICD-10-CM | POA: Diagnosis not present

## 2019-05-04 DIAGNOSIS — H518 Other specified disorders of binocular movement: Secondary | ICD-10-CM | POA: Diagnosis not present

## 2019-05-04 DIAGNOSIS — IMO0001 Reserved for inherently not codable concepts without codable children: Secondary | ICD-10-CM

## 2019-05-04 DIAGNOSIS — E1122 Type 2 diabetes mellitus with diabetic chronic kidney disease: Secondary | ICD-10-CM | POA: Diagnosis present

## 2019-05-04 DIAGNOSIS — Z8249 Family history of ischemic heart disease and other diseases of the circulatory system: Secondary | ICD-10-CM | POA: Diagnosis not present

## 2019-05-04 DIAGNOSIS — R419 Unspecified symptoms and signs involving cognitive functions and awareness: Secondary | ICD-10-CM | POA: Diagnosis present

## 2019-05-04 DIAGNOSIS — E119 Type 2 diabetes mellitus without complications: Secondary | ICD-10-CM | POA: Diagnosis not present

## 2019-05-04 DIAGNOSIS — H532 Diplopia: Secondary | ICD-10-CM | POA: Diagnosis present

## 2019-05-04 DIAGNOSIS — R29818 Other symptoms and signs involving the nervous system: Secondary | ICD-10-CM | POA: Diagnosis present

## 2019-05-04 DIAGNOSIS — E1151 Type 2 diabetes mellitus with diabetic peripheral angiopathy without gangrene: Secondary | ICD-10-CM | POA: Diagnosis not present

## 2019-05-04 DIAGNOSIS — R4189 Other symptoms and signs involving cognitive functions and awareness: Secondary | ICD-10-CM | POA: Diagnosis present

## 2019-05-04 DIAGNOSIS — I6389 Other cerebral infarction: Secondary | ICD-10-CM | POA: Diagnosis not present

## 2019-05-04 DIAGNOSIS — I63233 Cerebral infarction due to unspecified occlusion or stenosis of bilateral carotid arteries: Secondary | ICD-10-CM | POA: Diagnosis not present

## 2019-05-04 DIAGNOSIS — Z03818 Encounter for observation for suspected exposure to other biological agents ruled out: Secondary | ICD-10-CM | POA: Diagnosis not present

## 2019-05-04 DIAGNOSIS — K219 Gastro-esophageal reflux disease without esophagitis: Secondary | ICD-10-CM | POA: Diagnosis present

## 2019-05-04 DIAGNOSIS — H9190 Unspecified hearing loss, unspecified ear: Secondary | ICD-10-CM | POA: Diagnosis present

## 2019-05-04 LAB — BASIC METABOLIC PANEL
Anion gap: 15 (ref 5–15)
BUN: 35 mg/dL — ABNORMAL HIGH (ref 8–23)
CO2: 26 mmol/L (ref 22–32)
Calcium: 9 mg/dL (ref 8.9–10.3)
Chloride: 95 mmol/L — ABNORMAL LOW (ref 98–111)
Creatinine, Ser: 7.42 mg/dL — ABNORMAL HIGH (ref 0.61–1.24)
GFR calc Af Amer: 8 mL/min — ABNORMAL LOW (ref >60)
GFR calc non Af Amer: 7 mL/min — ABNORMAL LOW (ref >60)
Glucose, Bld: 190 mg/dL — ABNORMAL HIGH (ref 70–99)
Potassium: 5.2 mmol/L — ABNORMAL HIGH (ref 3.5–5.1)
Sodium: 136 mmol/L (ref 135–145)

## 2019-05-04 LAB — CBC WITH DIFFERENTIAL/PLATELET
Abs Immature Granulocytes: 0.08 10*3/uL — ABNORMAL HIGH (ref 0.00–0.07)
Basophils Absolute: 0.1 10*3/uL (ref 0.0–0.1)
Basophils Relative: 0 %
Eosinophils Absolute: 0.2 10*3/uL (ref 0.0–0.5)
Eosinophils Relative: 1 %
HCT: 35.7 % — ABNORMAL LOW (ref 39.0–52.0)
Hemoglobin: 11.5 g/dL — ABNORMAL LOW (ref 13.0–17.0)
Immature Granulocytes: 1 %
Lymphocytes Relative: 16 %
Lymphs Abs: 2.3 10*3/uL (ref 0.7–4.0)
MCH: 30.9 pg (ref 26.0–34.0)
MCHC: 32.2 g/dL (ref 30.0–36.0)
MCV: 96 fL (ref 80.0–100.0)
Monocytes Absolute: 1 10*3/uL (ref 0.1–1.0)
Monocytes Relative: 7 %
Neutro Abs: 10.5 10*3/uL — ABNORMAL HIGH (ref 1.7–7.7)
Neutrophils Relative %: 75 %
Platelets: 228 10*3/uL (ref 150–400)
RBC: 3.72 MIL/uL — ABNORMAL LOW (ref 4.22–5.81)
RDW: 13.6 % (ref 11.5–15.5)
WBC: 14.2 10*3/uL — ABNORMAL HIGH (ref 4.0–10.5)
nRBC: 0 % (ref 0.0–0.2)

## 2019-05-04 LAB — CREATININE, SERUM
Creatinine, Ser: 7.68 mg/dL — ABNORMAL HIGH (ref 0.61–1.24)
GFR calc Af Amer: 7 mL/min — ABNORMAL LOW (ref >60)
GFR calc non Af Amer: 6 mL/min — ABNORMAL LOW (ref >60)

## 2019-05-04 LAB — CBC
HCT: 33.2 % — ABNORMAL LOW (ref 39.0–52.0)
Hemoglobin: 10.8 g/dL — ABNORMAL LOW (ref 13.0–17.0)
MCH: 30.9 pg (ref 26.0–34.0)
MCHC: 32.5 g/dL (ref 30.0–36.0)
MCV: 95.1 fL (ref 80.0–100.0)
Platelets: 236 10*3/uL (ref 150–400)
RBC: 3.49 MIL/uL — ABNORMAL LOW (ref 4.22–5.81)
RDW: 13.7 % (ref 11.5–15.5)
WBC: 11.8 10*3/uL — ABNORMAL HIGH (ref 4.0–10.5)
nRBC: 0 % (ref 0.0–0.2)

## 2019-05-04 LAB — GLUCOSE, CAPILLARY
Glucose-Capillary: 154 mg/dL — ABNORMAL HIGH (ref 70–99)
Glucose-Capillary: 130 mg/dL — ABNORMAL HIGH (ref 70–99)

## 2019-05-04 MED ORDER — HYDRALAZINE HCL 50 MG PO TABS
50.0000 mg | ORAL_TABLET | Freq: Three times a day (TID) | ORAL | Status: DC
  Administered 2019-05-05 – 2019-05-06 (×5): 50 mg via ORAL
  Filled 2019-05-04 (×5): qty 1

## 2019-05-04 MED ORDER — SIMVASTATIN 20 MG PO TABS
40.0000 mg | ORAL_TABLET | Freq: Every day | ORAL | Status: DC
  Administered 2019-05-05 (×2): 40 mg via ORAL
  Filled 2019-05-04 (×2): qty 4

## 2019-05-04 MED ORDER — VITAMIN D 25 MCG (1000 UNIT) PO TABS
5000.0000 [IU] | ORAL_TABLET | Freq: Every day | ORAL | Status: DC
  Administered 2019-05-05 – 2019-05-06 (×2): 5000 [IU] via ORAL
  Filled 2019-05-04 (×2): qty 5

## 2019-05-04 MED ORDER — ASPIRIN 81 MG PO CHEW
324.0000 mg | CHEWABLE_TABLET | Freq: Once | ORAL | Status: AC
  Administered 2019-05-05: 324 mg via ORAL
  Filled 2019-05-04: qty 4

## 2019-05-04 MED ORDER — STROKE: EARLY STAGES OF RECOVERY BOOK
Freq: Once | Status: AC
  Administered 2019-05-05

## 2019-05-04 MED ORDER — RENA-VITE PO TABS
1.0000 | ORAL_TABLET | Freq: Every day | ORAL | Status: DC
  Administered 2019-05-05 – 2019-05-06 (×2): 1 via ORAL
  Filled 2019-05-04 (×2): qty 1

## 2019-05-04 MED ORDER — DOCUSATE SODIUM 100 MG PO CAPS
100.0000 mg | ORAL_CAPSULE | Freq: Two times a day (BID) | ORAL | Status: DC
  Administered 2019-05-05 – 2019-05-06 (×4): 100 mg via ORAL
  Filled 2019-05-04 (×4): qty 1

## 2019-05-04 MED ORDER — CLOPIDOGREL BISULFATE 75 MG PO TABS
75.0000 mg | ORAL_TABLET | Freq: Every day | ORAL | Status: DC
  Administered 2019-05-05 (×2): 75 mg via ORAL
  Filled 2019-05-04 (×2): qty 1

## 2019-05-04 MED ORDER — LEVOTHYROXINE SODIUM 137 MCG PO TABS
137.0000 ug | ORAL_TABLET | Freq: Every day | ORAL | Status: DC
  Administered 2019-05-05 – 2019-05-06 (×2): 137 ug via ORAL
  Filled 2019-05-04 (×2): qty 1

## 2019-05-04 MED ORDER — ACETAMINOPHEN 160 MG/5ML PO SOLN
650.0000 mg | ORAL | Status: DC | PRN
  Filled 2019-05-04: qty 20.3

## 2019-05-04 MED ORDER — INSULIN DETEMIR 100 UNIT/ML ~~LOC~~ SOLN
16.0000 [IU] | Freq: Every day | SUBCUTANEOUS | Status: DC
  Administered 2019-05-05 – 2019-05-06 (×2): 16 [IU] via SUBCUTANEOUS
  Filled 2019-05-04 (×4): qty 0.16

## 2019-05-04 MED ORDER — INSULIN ASPART 100 UNIT/ML ~~LOC~~ SOLN
0.0000 [IU] | SUBCUTANEOUS | Status: DC
  Administered 2019-05-05: 21:00:00 1 [IU] via SUBCUTANEOUS
  Administered 2019-05-05: 05:00:00 2 [IU] via SUBCUTANEOUS
  Administered 2019-05-05: 17:00:00 1 [IU] via SUBCUTANEOUS
  Filled 2019-05-04 (×3): qty 1

## 2019-05-04 MED ORDER — SEVELAMER CARBONATE 2.4 G PO PACK
2.4000 g | PACK | Freq: Three times a day (TID) | ORAL | Status: DC
  Administered 2019-05-05 – 2019-05-06 (×4): 2.4 g via ORAL
  Filled 2019-05-04 (×6): qty 1

## 2019-05-04 MED ORDER — LOSARTAN POTASSIUM 25 MG PO TABS
25.0000 mg | ORAL_TABLET | Freq: Every evening | ORAL | Status: DC
  Administered 2019-05-05: 25 mg via ORAL
  Filled 2019-05-04: qty 1

## 2019-05-04 MED ORDER — ACETAMINOPHEN 650 MG RE SUPP: 650.0000 mg | RECTAL | Status: DC | PRN

## 2019-05-04 MED ORDER — PANTOPRAZOLE SODIUM 40 MG PO TBEC
40.0000 mg | DELAYED_RELEASE_TABLET | Freq: Every day | ORAL | Status: DC
  Administered 2019-05-05 – 2019-05-06 (×2): 40 mg via ORAL
  Filled 2019-05-04 (×2): qty 1

## 2019-05-04 MED ORDER — ACETAMINOPHEN 325 MG PO TABS: 650.0000 mg | ORAL_TABLET | ORAL | Status: DC | PRN

## 2019-05-04 MED ORDER — HEPARIN SODIUM (PORCINE) 5000 UNIT/ML IJ SOLN
5000.0000 [IU] | Freq: Three times a day (TID) | INTRAMUSCULAR | Status: DC
  Administered 2019-05-05 – 2019-05-06 (×4): 5000 [IU] via SUBCUTANEOUS
  Filled 2019-05-04 (×5): qty 1

## 2019-05-04 MED ORDER — SENNOSIDES-DOCUSATE SODIUM 8.6-50 MG PO TABS
1.0000 | ORAL_TABLET | Freq: Every evening | ORAL | Status: DC | PRN
  Administered 2019-05-05: 21:00:00 1 via ORAL
  Filled 2019-05-04: qty 1

## 2019-05-04 MED ORDER — DONEPEZIL HCL 5 MG PO TABS
10.0000 mg | ORAL_TABLET | Freq: Every day | ORAL | Status: DC
  Administered 2019-05-05 (×2): 10 mg via ORAL
  Filled 2019-05-04 (×2): qty 2

## 2019-05-04 MED ORDER — METOPROLOL SUCCINATE ER 50 MG PO TB24
50.0000 mg | ORAL_TABLET | Freq: Every day | ORAL | Status: DC
  Administered 2019-05-05 – 2019-05-06 (×2): 50 mg via ORAL
  Filled 2019-05-04 (×2): qty 1

## 2019-05-04 NOTE — ED Provider Notes (Signed)
[facility] Emergency Department Provider Note   ____________________________________________   First MD Initiated Contact with Patient 05/04/19 1854     (approximate)  I have reviewed the triage vital signs and the nursing notes.   HISTORY  Chief Complaint Diplopia (started 05/03/19)    HPI Joseph Mcpherson is a 73 y.o. male patient reports yesterday he had onset of diplopia and trouble focusing.  Today he and his son went to see the eye doctor who sent him here.  MRI here shows no acute stroke.  Patient reports his symptoms are completely resolved.  His blood pressure was high is coming down slowly current reading is 350 systolic.         Past Medical History:  Diagnosis Date  . Chronic kidney disease   . Clotting disorder ([place])    per daughter, "when patient travels, his feet swell"  . Diabetes mellitus without complication ([place])   . GERD (gastroesophageal reflux disease)   . Hyperlipidemia   . Hypertension   . Hypothyroidism   . Thyroid disease     Patient Active Problem List   Diagnosis Date Noted  . Complication of vascular access for dialysis 04/27/2019  . Surgery follow-up examination 03/09/2019  . End stage renal disease ([name]) 02/09/2018  . Anemia in chronic kidney disease 02/09/2018  . Iron deficiency anemia, unspecified 02/09/2018  . Pruritus, unspecified 02/09/2018  . Secondary hyperparathyroidism of renal origin ([name]) 02/09/2018  . Hypertensive chronic kidney disease with stage 1 through stage 4 chronic kidney disease, or unspecified chronic kidney disease 03/16/2016  . Peripheral vascular disease ([place]) 03/16/2016  . Headache, unspecified 03/16/2016  . Diabetic neuropathy associated with diabetes mellitus due to underlying condition ([name]) 03/16/2016  . Mixed hyperlipidemia 07/15/2015  . Acquired hypothyroidism 07/14/2015  . Hearing loss associated with syndrome of both ears 03/19/2015  . Type 2 diabetes mellitus with diabetic  neuropathy, unspecified ([name]) 03/13/2015  . Major neurocognitive disorder ([name]) 03/13/2015  . Essential hypertension 11/27/2014  . Type 2 diabetes mellitus with other diabetic kidney complication ([name]) 09/38/1829  . Pulmonary embolism ([name]) 09/13/2014  . 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[name]) 05/31/2014  . Constipation 10/14/2013    Past Surgical History:  Procedure Laterality Date  . AV FISTULA PLACEMENT Right 10/22/2017   Procedure: ARTERIOVENOUS (AV) BRACHIOCEPHALIC FISTULA CREATION RIGHT UPPER ARM;  Surgeon: Conrad Crook, MD;  Location: [place];  Service: Vascular;  Laterality: Right;  . AV FISTULA PLACEMENT Left 02/03/2019   Procedure: INSERTION OF ARTERIOVENOUS (AV) GORE-TEX GRAFT ARM (BRACHIAL AXILLARY );  Surgeon: [name], MD;  Location: [facility];  Service: Vascular;  Laterality: Left;    Prior to Admission medications   Medication Sig Start Date End Date Taking? Authorizing Provider  aspirin EC 81 MG tablet Take 81 mg by mouth daily.    Yes [provider]  B Complex-C-Folic Acid (RENA-VITE RX) 1 MG TABS Take 1 tablet by mouth daily.  12/07/18  Yes [provider]  Cholecalciferol (VITAMIN D3) 5000 units TABS Take 5,000 Units by mouth daily.    Yes [provider]  docusate sodium (COLACE) 100 MG capsule Take 100 mg by mouth 2 (two) times daily.    Yes [provider]  donepezil (ARICEPT) 10 MG tablet Take 10 mg by mouth at bedtime.  09/26/15  Yes [provider]  gabapentin (NEURONTIN) 300 MG capsule Take 300 mg by mouth at bedtime.    Yes [provider]  hydrALAZINE (APRESOLINE) 50  MG tablet Take 50 mg by mouth 3 (three) times daily.  12/15/15  Yes [provider]  insulin aspart (NOVOLOG FLEXPEN) 100 UNIT/ML FlexPen Inject 8 Units into the skin 2 (two) times daily.    Yes [provider]  insulin detemir (LEVEMIR) 100 UNIT/ML injection Inject 16 Units into the skin at bedtime.    Yes  [provider]  levothyroxine (SYNTHROID, LEVOTHROID) 137 MCG tablet Take 137 mcg by mouth daily before breakfast.   Yes [provider]  losartan (COZAAR) 25 MG tablet Take 25 mg by mouth every evening.  12/11/18  Yes [provider]  metoprolol succinate (TOPROL-XL) 50 MG 24 hr tablet Take 50 mg by mouth daily.  01/07/16  Yes [provider]  omeprazole (PRILOSEC) 40 MG capsule Take 40 mg by mouth daily.    Yes [provider]  sevelamer carbonate (RENVELA) 2.4 g PACK Take 2.4 g by mouth 3 (three) times daily. 04/26/19  Yes [provider]  simvastatin (ZOCOR) 20 MG tablet Take 20 mg by mouth every evening.    Yes [provider]  sitaGLIPtin (JANUVIA) 50 MG tablet Take 50 mg by mouth every evening.    Yes [provider]  HYDROcodone-acetaminophen (NORCO) 5-325 MG tablet Take 1-2 tablets by mouth every 6 (six) hours as needed for moderate pain or severe pain. Patient not taking: Reported on 05/04/2019 02/03/19   Schnier, [name], MD    Allergies Patient has no known allergies.  Family History  Problem Relation Age of Onset  . Diabetes Mother   . Hyperlipidemia Mother   . Hypertension Father     Social History Social History   Tobacco Use  . Smoking status: Never Smoker  . Smokeless tobacco: Former Systems developer    Types: Snuff  Substance Use Topics  . Alcohol use: No  . Drug use: No    Review of Systems  Constitutional: No fever/chills Eyes: No visual changes. ENT: No sore throat. Cardiovascular: Denies chest pain. Respiratory: Denies shortness of breath. Gastrointestinal: No abdominal pain.  No nausea, no vomiting.  No diarrhea.  No constipation. Genitourinary: Negative for dysuria. Musculoskeletal: Negative for back pain. Skin: Negative for rash. Neurological: Negative for headaches, focal weakness   ____________________________________________   PHYSICAL EXAM:  VITAL SIGNS: ED Triage Vitals  [05/04/19 1354]  Enc Vitals Group     BP (!) 152/50     Pulse Rate (!) 59     Resp 16     Temp 98.3 F (36.8 C)     Temp Source Oral     SpO2 100 %     Weight 154 lb (69.9 kg)     Height 5\' 4"  (1.626 m)     Head Circumference      Peak Flow      Pain Score 0     Pain Loc      Pain Edu?      Excl. in [employer]?     Constitutional: Alert and oriented. Well appearing and in no acute distress. Eyes: Conjunctivae are normal. PERRL. EOMI. Head: Atraumatic. Nose: No congestion/rhinnorhea. Mouth/Throat: Mucous membranes are moist.  Oropharynx non-erythematous. Neck: No stridor.  Cardiovascular: Normal rate, regular rhythm. Grossly normal heart sounds.  Good peripheral circulation. Respiratory: Normal respiratory effort.  No retractions. Lungs CTAB. Gastrointestinal: Soft and nontender. No distention. No abdominal bruits. No CVA tenderness. Musculoskeletal: No lower extremity tenderness    Neurologic:  Normal speech and language. No gross focal neurologic deficits are appreciated.  Cranial nerves II through XII appear to be intact although visual fields are not checked.  Cerebellar finger-to-nose and rapid alternating movements are slightly slow bilaterally.  There is no actual ataxia though.  Motor strength is 5/5 throughout and patient does not report any numbness. Skin:  Skin is warm, dry and intact. No rash noted.   ____________________________________________   LABS (all labs ordered are listed, but only abnormal results are displayed)  Labs Reviewed  BASIC METABOLIC PANEL - Abnormal; Notable for the following components:      Result Value   Potassium 5.2 (*)    Chloride 95 (*)    Glucose, Bld 190 (*)    BUN 35 (*)    Creatinine, Ser 7.42 (*)    GFR calc non Af Amer 7 (*)    GFR calc Af Amer 8 (*)    All other components within normal limits  CBC WITH DIFFERENTIAL/PLATELET - Abnormal; Notable for the following components:   WBC 14.2 (*)    RBC 3.72 (*)    Hemoglobin 11.5 (*)     HCT 35.7 (*)    Neutro Abs 10.5 (*)    Abs Immature Granulocytes 0.08 (*)    All other components within normal limits  SARS CORONAVIRUS 2 (TAT 6-24 HRS)   ____________________________________________  EKG   ____________________________________________  RADIOLOGY  ED MD interpretation: MRI read by radiology as acute stroke which would explain patient's symptoms.  Official radiology report(s): MR Brain Wo Contrast (neuro protocol)  Result Date: 05/04/2019 CLINICAL DATA:  Double vision EXAM: MRI HEAD AND ORBITS WITHOUT CONTRAST TECHNIQUE: Multiplanar, multiecho pulse sequences of the brain and surrounding structures were obtained without intravenous contrast. Multiplanar, multiecho pulse sequences of the orbits and surrounding structures were obtained including fat saturation techniques, without intravenous contrast administration. COMPARISON:  09/12/2013 FINDINGS: MRI HEAD FINDINGS Brain: Acute punctate infarction in the medial longitudinal fasciculus region of the dorsal pons on the left could certainly explain double vision. No other acute brain infarction. Old small 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 Mild chronic small-vessel ischemic changes affect the cerebral hemispheric white matter elsewhere. No other large vessel territory infarction. No mass lesion, hemorrhage, hydrocephalus or extra-axial collection. Vascular: Major vessels at the base of the brain show flow. Skull and upper cervical spine: Negative Other: Retention cysts of the maxillary sinuses. MRI ORBITS FINDINGS Orbits: Both globes appear normal. Previous lens implants. Optic nerves are normal. Extra-ocular muscles are normal. Orbital fat is normal. Lacrimal glands are normal. Visualized sinuses: Retention cysts as noted above. Soft tissues: Otherwise negative Limited intracranial: See results of brain study above. IMPRESSION: Acute  infarction of the medial longitudinal fasciculus region of the dorsal pons on the left which could certainly explain double vision. 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 Mild chronic small-vessel change elsewhere affecting the brain as described above. No primary orbital pathology seen. Electronically Signed   By: Nelson Chimes M.D.   On: 05/04/2019 17:12   MR ORBITS WO CONTRAST  Result Date: 05/04/2019 CLINICAL DATA:  Double vision EXAM: MRI HEAD AND ORBITS WITHOUT CONTRAST TECHNIQUE: Multiplanar, multiecho pulse sequences of the brain and surrounding structures were obtained without intravenous contrast. Multiplanar, multiecho pulse sequences of the orbits and surrounding structures were obtained including fat saturation techniques, without intravenous contrast administration. COMPARISON:  09/12/2013 FINDINGS: MRI HEAD FINDINGS Brain: Acute punctate infarction in the medial longitudinal fasciculus region of the dorsal pons on the left could certainly  explain double vision. No other acute brain infarction. Old small 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 Mild chronic small-vessel ischemic changes affect the cerebral hemispheric white matter elsewhere. No other large vessel territory infarction. No mass lesion, hemorrhage, hydrocephalus or extra-axial collection. Vascular: Major vessels at the base of the brain show flow. Skull and upper cervical spine: Negative Other: Retention cysts of the maxillary sinuses. MRI ORBITS FINDINGS Orbits: Both globes appear normal. Previous lens implants. Optic nerves are normal. Extra-ocular muscles are normal. Orbital fat is normal. Lacrimal glands are normal. Visualized sinuses: Retention cysts as noted above. Soft tissues: Otherwise negative Limited intracranial: See results of brain study above. IMPRESSION: Acute infarction of the medial longitudinal fasciculus region of the dorsal pons on the  left which could certainly explain double vision. 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 Mild chronic small-vessel change elsewhere affecting the brain as described above. No primary orbital pathology seen. Electronically Signed   By: Nelson Chimes M.D.   On: 05/04/2019 17:12    ____________________________________________   PROCEDURES  Procedure(s) performed (including Critical Care):  Procedures   ____________________________________________   INITIAL IMPRESSION / ASSESSMENT AND PLAN / ED COURSE  Patient initially wanted to go home I think I talked him into staying at least so we can complete her stroke work-up.  I would not want him to have another stroke as the MRI also showed an old stroke.     Auther Lyerly was evaluated in Emergency Department on 05/04/2019 for the symptoms described in the history of present illness. He was evaluated in the context of the global COVID-19 pandemic, which necessitated consideration that the patient might be at risk for infection with the SARS-CoV-2 virus that causes COVID-19. Institutional protocols and algorithms that pertain to the evaluation of patients at risk for COVID-19 are in a state of rapid change based on information released by regulatory bodies including the CDC and federal and state organizations. These policies and algorithms were followed during the patient's care in the ED.         ____________________________________________   FINAL CLINICAL IMPRESSION(S) / ED DIAGNOSES  Final diagnoses:  Cerebrovascular accident (CVA), unspecified mechanism [facility])     ED Discharge Orders    None       Note:  This document was prepared using Dragon voice recognition software and may include unintentional dictation errors.    Nena Polio, MD 05/04/19 [phone]

## 2019-05-04 NOTE — ED Triage Notes (Addendum)
Presents with double vision that started yesterday 05/03/19. Patient sent here by Benay Pillow MD for MRI brain. Dialysis patient M,W,F. Access on left arm. Patient hard of hearing. Wife present answering triage questions

## 2019-05-04 NOTE — ED Notes (Signed)
Pt wife out to nurses station. Asking if pt can have something to eat. Instructed to wait until swallow study has been completed. Pt continues to deny pain and follows instructions / commands without difficulty.

## 2019-05-04 NOTE — ED Notes (Signed)
Report given to [facility], 1C. Pt going to room 109 instead of 155.

## 2019-05-04 NOTE — ED Notes (Signed)
Pt resting on stretcher with wife at the bedside. Pt denies pain at this time. Pt alert and calm. Will continue to assess.

## 2019-05-04 NOTE — ED Notes (Signed)
IV team for difficult stick at bedside

## 2019-05-04 NOTE — ED Notes (Signed)
This RN apologized for delay. Pt and wife states understanding. Pt denies any needs. Will continue to monitor for further patient needs.

## 2019-05-04 NOTE — ED Notes (Signed)
Attempted to call report, placed on hold

## 2019-05-04 NOTE — ED Notes (Signed)
Pt to xray

## 2019-05-04 NOTE — H&P (Signed)
History and Physical    [name] DUK:[id number] DOB: 1946-03-02 DOA: 05/04/2019  PCP: [name], MD  Patient coming from:home I have personally briefly reviewed patient's old medical records in [place]  Chief Complaint: Double vision.  Abnormal MRI  HPI: Joseph Mcpherson is a 73 y.o. 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  He was seen at a routine visit by his vascular surgeon earlier today where he complained of a 1 day history of double vision.  An MRI was ordered stroke in the pons which probably explains the reason for his visual disturbance.  Most of the history is given by his wife at the bedside as patient is hearing impaired and has cognitive dysfunction.  She states that outside of the double vision he is in his usual state of health.  He has had no difficulty speaking, swallowing, ambulating and does not appear to be weak in the extremities nor has he complained of numbness or tingling.  He has had no headache has had no chest pain or shortness of breath. ED Course: In the emergency room his initial blood pressure was 177/64 with otherwise normal vitals.  Aspirin was ordered.  Review of Systems: As per HPI otherwise 10 point review of systems negative.  Review of systems done through conversation with wife at bedside.   Past Medical History:  Diagnosis Date  . Chronic kidney disease   . Clotting disorder ([place])    per daughter, "when patient travels, his feet swell"  . Diabetes mellitus without complication ([place])   . GERD (gastroesophageal reflux disease)   . Hyperlipidemia   . Hypertension   . Hypothyroidism   . Thyroid disease     Past Surgical History:  Procedure Laterality Date  . AV FISTULA PLACEMENT Right 10/22/2017   Procedure:  ARTERIOVENOUS (AV) BRACHIOCEPHALIC FISTULA CREATION RIGHT UPPER ARM;  Surgeon: Conrad Oketo, MD;  Location: [place];  Service: Vascular;  Laterality: Right;  . AV FISTULA PLACEMENT Left 02/03/2019   Procedure: INSERTION OF ARTERIOVENOUS (AV) GORE-TEX GRAFT ARM (BRACHIAL AXILLARY );  Surgeon: [name], MD;  Location: [facility];  Service: Vascular;  Laterality: Left;     reports that he has never smoked. He has quit using smokeless tobacco.  His smokeless tobacco use included snuff. He reports that he does not drink alcohol or use drugs.  No Known Allergies  Family History  Problem Relation Age of Onset  . Diabetes Mother   . Hyperlipidemia Mother   . Hypertension Father      Prior to Admission medications   Medication Sig Start Date End Date Taking? Authorizing Provider  aspirin EC 81 MG tablet Take 81 mg by mouth daily.    Yes [provider]  B Complex-C-Folic Acid (RENA-VITE RX) 1 MG TABS Take 1 tablet by mouth daily.  12/07/18  Yes [provider]  Cholecalciferol (VITAMIN D3) 5000 units TABS Take 5,000 Units by mouth daily.    Yes [provider]  docusate sodium (COLACE) 100 MG capsule Take 100 mg by mouth 2 (two) times daily.    Yes [provider]  donepezil (ARICEPT) 10 MG tablet Take 10 mg by mouth at bedtime.  09/26/15  Yes [provider]  gabapentin (NEURONTIN) 300 MG capsule Take 300 mg by mouth at bedtime.  Yes [provider]  hydrALAZINE (APRESOLINE) 50 MG tablet Take 50 mg by mouth 3 (three) times daily.  12/15/15  Yes [provider]  insulin aspart (NOVOLOG FLEXPEN) 100 UNIT/ML FlexPen Inject 8 Units into the skin 2 (two) times daily.    Yes [provider]  insulin detemir (LEVEMIR) 100 UNIT/ML injection Inject 16 Units into the skin at bedtime.    Yes [provider]  levothyroxine (SYNTHROID, LEVOTHROID) 137 MCG tablet Take 137 mcg by mouth daily before breakfast.   Yes [provider]  losartan (COZAAR) 25 MG tablet Take 25 mg by mouth every evening.  12/11/18  Yes [provider]  metoprolol succinate (TOPROL-XL) 50 MG 24 hr tablet Take 50 mg by mouth daily.  01/07/16  Yes [provider]  omeprazole (PRILOSEC) 40 MG capsule Take 40 mg by mouth daily.    Yes [provider]  sevelamer carbonate (RENVELA) 2.4 g PACK Take 2.4 g by mouth 3 (three) times daily. 04/26/19  Yes [provider]  simvastatin (ZOCOR) 20 MG tablet Take 20 mg by mouth every evening.    Yes [provider]  sitaGLIPtin (JANUVIA) 50 MG tablet Take 50 mg by mouth every evening.    Yes [provider]  HYDROcodone-acetaminophen (NORCO) 5-325 MG tablet Take 1-2 tablets by mouth every 6 (six) hours as needed for moderate pain or severe pain. Patient not taking: Reported on 05/04/2019 02/03/19   Schnier, [name], MD    Physical Exam: Vitals:   05/04/19 1354 05/04/19 1726  BP: (!) 152/50 (!) 182/53  Pulse: (!) 59 64  Resp: 16 18  Temp: 98.3 F (36.8 C)   TempSrc: Oral   SpO2: 100% 100%  Weight: 69.9 kg   Height: 5\' 4"  (1.626 m)     Constitutional: NAD, calm, comfortable Vitals:   05/04/19 1354 05/04/19 1726  BP: (!) 152/50 (!) 182/53  Pulse: (!) 59 64  Resp: 16 18  Temp: 98.3 F (36.8 C)   TempSrc: Oral   SpO2: 100% 100%  Weight: 69.9 kg   Height: 5\' 4"  (1.626 m)    Eyes: PERRL, lids and conjunctivae normal ENMT: Mucous membranes are moist. Posterior pharynx clear of any exudate or lesions.patient is hearing impaired Neck: normal, supple, no masses, no thyromegaly Respiratory: clear to auscultation bilaterally, no wheezing, no crackles. Normal respiratory effort. No accessory muscle use.  Cardiovascular: Regular rate and rhythm, no murmurs / rubs / gallops. No extremity edema. 2+ pedal pulses. No carotid bruits.  Abdomen: no tenderness, no masses palpated. No hepatosplenomegaly. Bowel sounds positive.  Musculoskeletal: no  clubbing / cyanosis. No joint deformity upper and lower extremities. Good ROM, no contractures. Normal muscle tone.  Skin: no rashes, lesions, ulcers. No induration Neurologic: No gross deficits appreciated.  CN 2-12 grossly intact. Sensation intact, DTR normal. Strength 5/5 in all 4.  Psychiatric: Alert and oriented x 2. Normal mood.     Labs on Admission: I have personally reviewed following labs and imaging studies  CBC: Recent Labs  Lab 05/04/19 1413  WBC 14.2*  NEUTROABS 10.5*  HGB 11.5*  HCT 35.7*  MCV 96.0  PLT 329   Basic Metabolic Panel: Recent Labs  Lab 05/04/19 1413  NA 136  K 5.2*  CL 95*  CO2 26  GLUCOSE 190*  BUN 35*  CREATININE 7.42*  CALCIUM 9.0   GFR: Estimated Creatinine Clearance: 7.4 mL/min (A) (by C-G formula based on SCr of 7.42 mg/dL (H)). Liver Function Tests:  No results for input(s): AST, ALT, ALKPHOS, BILITOT, PROT, ALBUMIN in the last 168 hours. No results for input(s): LIPASE, AMYLASE in the last 168 hours. No results for input(s): AMMONIA in the last 168 hours. Coagulation Profile: No results for input(s): INR, PROTIME in the last 168 hours. Cardiac Enzymes: No results for input(s): CKTOTAL, CKMB, CKMBINDEX, TROPONINI in the last 168 hours. BNP (last 3 results) No results for input(s): PROBNP in the last 8760 hours. HbA1C: No results for input(s): HGBA1C in the last 72 hours. CBG: No results for input(s): GLUCAP in the last 168 hours. Lipid Profile: No results for input(s): CHOL, HDL, LDLCALC, TRIG, CHOLHDL, LDLDIRECT in the last 72 hours. Thyroid Function Tests: No results for input(s): TSH, T4TOTAL, FREET4, T3FREE, THYROIDAB in the last 72 hours. Anemia Panel: No results for input(s): VITAMINB12, FOLATE, FERRITIN, TIBC, IRON, RETICCTPCT in the last 72 hours. Urine analysis:    Component Value Date/Time   COLORURINE Straw 09/11/2013 1933   APPEARANCEUR Clear 09/11/2013 1933   LABSPEC 1.016 09/11/2013 1933   PHURINE 7.0  09/11/2013 1933   GLUCOSEU >=500 09/11/2013 1933   HGBUR Negative 09/11/2013 1933   BILIRUBINUR Negative 09/11/2013 1933   KETONESUR Negative 09/11/2013 1933   PROTEINUR 100 mg/dL 09/11/2013 1933   NITRITE Negative 09/11/2013 1933   LEUKOCYTESUR Negative 09/11/2013 1933    Radiological Exams on Admission: MR Brain Wo Contrast (neuro protocol)  Result Date: 05/04/2019 CLINICAL DATA:  Double vision EXAM: MRI HEAD AND ORBITS WITHOUT CONTRAST TECHNIQUE: Multiplanar, multiecho pulse sequences of the brain and surrounding structures were obtained without intravenous contrast. Multiplanar, multiecho pulse sequences of the orbits and surrounding structures were obtained including fat saturation techniques, without intravenous contrast administration. COMPARISON:  09/12/2013 FINDINGS: MRI HEAD FINDINGS Brain: Acute punctate infarction in the medial longitudinal fasciculus region of the dorsal pons on the left could certainly explain double vision. No other acute brain infarction. Old small 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 Mild chronic small-vessel ischemic changes affect the cerebral hemispheric white matter elsewhere. No other large vessel territory infarction. No mass lesion, hemorrhage, hydrocephalus or extra-axial collection. Vascular: Major vessels at the base of the brain show flow. Skull and upper cervical spine: Negative Other: Retention cysts of the maxillary sinuses. MRI ORBITS FINDINGS Orbits: Both globes appear normal. Previous lens implants. Optic nerves are normal. Extra-ocular muscles are normal. Orbital fat is normal. Lacrimal glands are normal. Visualized sinuses: Retention cysts as noted above. Soft tissues: Otherwise negative Limited intracranial: See results of brain study above. IMPRESSION: Acute infarction of the medial longitudinal fasciculus region of the dorsal pons on the left which could  certainly explain double vision. 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 Mild chronic small-vessel change elsewhere affecting the brain as described above. No primary orbital pathology seen. Electronically Signed   By: Nelson Chimes M.D.   On: 05/04/2019 17:12   MR ORBITS WO CONTRAST  Result Date: 05/04/2019 CLINICAL DATA:  Double vision EXAM: MRI HEAD AND ORBITS WITHOUT CONTRAST TECHNIQUE: Multiplanar, multiecho pulse sequences of the brain and surrounding structures were obtained without intravenous contrast. Multiplanar, multiecho pulse sequences of the orbits and surrounding structures were obtained including fat saturation techniques, without intravenous contrast administration. COMPARISON:  09/12/2013 FINDINGS: MRI HEAD FINDINGS Brain: Acute punctate infarction in the medial longitudinal fasciculus region of the dorsal pons on the left could certainly explain double vision. No other acute brain infarction. Old small vessel infarction in the right thalamus.  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 Mild chronic small-vessel ischemic changes affect the cerebral hemispheric white matter elsewhere. No other large vessel territory infarction. No mass lesion, hemorrhage, hydrocephalus or extra-axial collection. Vascular: Major vessels at the base of the brain show flow. Skull and upper cervical spine: Negative Other: Retention cysts of the maxillary sinuses. MRI ORBITS FINDINGS Orbits: Both globes appear normal. Previous lens implants. Optic nerves are normal. Extra-ocular muscles are normal. Orbital fat is normal. Lacrimal glands are normal. Visualized sinuses: Retention cysts as noted above. Soft tissues: Otherwise negative Limited intracranial: See results of brain study above. IMPRESSION: Acute infarction of the medial longitudinal fasciculus region of the dorsal pons on the left which could certainly explain double vision. 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 Mild chronic  small-vessel change elsewhere affecting the brain as described above. No primary orbital pathology seen. Electronically Signed   By: Nelson Chimes M.D.   On: 05/04/2019 17:12     Assessment/Plan Principal Problem:   Acute CVA (cerebrovascular accident) [facility]) --Patient presented 24 hours later and outside of TPA window --MRI findings as above showing acute CVA --Patient was previously on a baby aspirin and simvastatin 20 mg daily --Add Plavix to her regimen.  Intensify simvastatin to 40 mg daily --Carotid Doppler, echocardiogram, continuous cardiac monitoring --Speech, physical and Occupational Therapy consults per routine --Patient has history of remote CVA without residual deficits he was also shown on MRI   Insulin dependent type 2 diabetes mellitus ([place]) --Oral hypoglycemics --Continue basal insulin.  Supplemental insulin coverage --A1c in the a.m.   Hypothyroidism (acquired) --Continue home levothyroxine   Hearing impaired --Increase nursing assistance for communication   Neurocognitive deficits --Continue Aricept   Hemodialysis status [facility]) --Nephrology consult for continuation of dialysis   History of CVA (cerebrovascular accident)       DVT prophylaxis: heparincode Status: full Family Communication: spoke with wife. Tamera Reason and explained plan of care. Athena Masse MD [facility]   If 7PM-7AM, please contact night-coverage [email or web address] Password [email or web address]  05/04/2019, 7:45 PM

## 2019-05-05 ENCOUNTER — Inpatient Hospital Stay
Admit: 2019-05-05 | Discharge: 2019-05-05 | Disposition: A | Discharge status: Home / Self Care | Payer: Medicare HMO | Attending: Internal Medicine | Admitting: Internal Medicine

## 2019-05-05 ENCOUNTER — Inpatient Hospital Stay: Payer: Medicare HMO

## 2019-05-05 DIAGNOSIS — N186 End stage renal disease: Secondary | ICD-10-CM

## 2019-05-05 DIAGNOSIS — I639 Cerebral infarction, unspecified: Principal | ICD-10-CM

## 2019-05-05 LAB — ECHOCARDIOGRAM COMPLETE
Height: 64 in
Weight: 2464 oz

## 2019-05-05 LAB — MRSA PCR SCREENING: MRSA by PCR: NEGATIVE

## 2019-05-05 LAB — LIPID PANEL
Cholesterol: 144 mg/dL (ref 0–200)
HDL: 29 mg/dL — ABNORMAL LOW (ref >40)
LDL Cholesterol: 55 mg/dL (ref 0–99)
Total CHOL/HDL Ratio: 5 RATIO
Triglycerides: 301 mg/dL — ABNORMAL HIGH (ref <150)
VLDL: 60 mg/dL — ABNORMAL HIGH (ref 0–40)

## 2019-05-05 LAB — GLUCOSE, CAPILLARY
Glucose-Capillary: 121 mg/dL — ABNORMAL HIGH (ref 70–99)
Glucose-Capillary: 145 mg/dL — ABNORMAL HIGH (ref 70–99)
Glucose-Capillary: 151 mg/dL — ABNORMAL HIGH (ref 70–99)
Glucose-Capillary: 176 mg/dL — ABNORMAL HIGH (ref 70–99)
Glucose-Capillary: 205 mg/dL — ABNORMAL HIGH (ref 70–99)
Glucose-Capillary: 97 mg/dL (ref 70–99)

## 2019-05-05 LAB — HEMOGLOBIN A1C
Hgb A1c MFr Bld: 6.7 % — ABNORMAL HIGH (ref 4.8–5.6)
Mean Plasma Glucose: 145.59 mg/dL

## 2019-05-05 LAB — SARS CORONAVIRUS 2 (TAT 6-24 HRS): SARS Coronavirus 2: NEGATIVE

## 2019-05-05 MED ORDER — ASPIRIN EC 325 MG PO TBEC
325.0000 mg | DELAYED_RELEASE_TABLET | Freq: Every day | ORAL | Status: DC
  Administered 2019-05-05 – 2019-05-06 (×2): 325 mg via ORAL
  Filled 2019-05-05 (×2): qty 1

## 2019-05-05 MED ORDER — CHLORHEXIDINE GLUCONATE CLOTH 2 % EX PADS
6.0000 | MEDICATED_PAD | Freq: Every day | CUTANEOUS | Status: DC
  Administered 2019-05-05 – 2019-05-06 (×2): 6 via TOPICAL

## 2019-05-05 NOTE — Progress Notes (Signed)
Pre HD    05/05/19 1200  Vital Signs  Temp (!) 97.5 F (36.4 C)  Temp Source Oral  Pulse Rate (!) 56  Pulse Rate Source Dinamap  Resp 16  BP (!) 153/83  BP Location Right Arm  BP Method Automatic  Patient Position (if appropriate) Lying  Oxygen Therapy  SpO2 100 %  O2 Device Room Air  Pain Assessment  Pain Scale 0-10  Pain Score 0  Time-Out for Hemodialysis  What Procedure? HD   Pt Identifiers(min of two) First/Last Name;MRN/Account#;Pt's DOB(use if MRN/Acct# not available  Correct Site? Yes  Correct Side? Yes  Correct Procedure? Yes  Consents Verified? Yes  Rad Studies Available? N/A  Safety Precautions Reviewed? Yes  Engineer, civil (consulting) Number [id number]  Station Number 3  UF/Alarm Test Passed  Conductivity: Meter 14  Conductivity: Machine  13.8  pH 7.2  Reverse Osmosis main  Normal Saline Lot Number [id number]  Dialyzer Lot Number [id number]  Disposable Set Lot Number [id number]  Machine Temperature 98.6 F (37 C)  Musician and Audible Yes  Blood Lines Intact and Secured Yes  Pre Treatment Patient Checks  Vascular access used during treatment Catheter  HD catheter dressing before treatment WDL  Patient is receiving dialysis in a chair  (no)  Hepatitis B Surface Antigen Results Negative  Date Hepatitis B Surface Antigen Drawn 04/26/19  Isolation Initiated  (no)  Hepatitis B Surface Antibody  (>253)  Date Hepatitis B Surface Antibody Drawn 01/25/19  Hemodialysis Consent Verified Yes  Hemodialysis Standing Orders Initiated Yes  ECG (Telemetry) Monitor On Yes  Prime Ordered Normal Saline  Length of  DialysisTreatment -hour(s) 3.5 Hour(s)  Dialysis Treatment Comments  (Na 140)  Dialyzer Elisio 17H NR  Dialysate 2K;2.5 Ca  Dialysate Flow Ordered 600  Blood Flow Rate Ordered 400 mL/min  Ultrafiltration Goal 1 Liters  Dialysis Blood Pressure Support Ordered Normal Saline  Education / Care Plan  Dialysis Education Provided Yes  Documented Education in Care  Plan Yes  Outpatient Plan of Care Reviewed and on Chart Yes  Fistula / Graft Right Upper arm Arteriovenous fistula  Placement Date/Time: 10/22/17 0815   Placed prior to admission: No  Orientation: Right  Access Location: Upper arm  Access Type: Arteriovenous fistula  Site Condition No complications  Fistula / Graft Assessment Bruit;Thrill;Present  Status Other (Comment) (assessed)  Drainage Description None  Hemodialysis Catheter Right Subclavian Double lumen Permanent (Tunneled)  No Placement Date or Time found.   Placed prior to admission: Yes  Orientation: Right  Access Location: Subclavian  Hemodialysis Catheter Type: Double lumen Permanent (Tunneled)  Site Condition No complications  Blue Lumen Status Blood return noted  Red Lumen Status Blood return noted  Purple Lumen Status N/A  Dressing Type Biopatch;Occlusive  Dressing Status Clean;Dry;Intact  Drainage Description None

## 2019-05-05 NOTE — Progress Notes (Signed)
  Post HD     05/05/19 1530  Vital Signs  Temp 98.6 F (37 C)  Temp Source Oral  Pulse Rate 64  Pulse Rate Source Dinamap  Resp 12  BP (!) 133/53  BP Location Right Arm  BP Method Automatic  Patient Position (if appropriate) Lying  Pain Assessment  Pain Scale 0-10  Post-Hemodialysis Assessment  Rinseback Volume (mL) 250 mL  KECN 74.5 V  Dialyzer Clearance Lightly streaked  Duration of HD Treatment -hour(s) 3.5 hour(s)  Hemodialysis Intake (mL) 500 mL  UF Total -Machine (mL) 1416 mL  Net UF (mL) 916 mL  Tolerated HD Treatment No (Comment) (Pt request to end tx 12 minutes early)  AVG/AVF Arterial Site Held (minutes)  (n/a)  AVG/AVF Venous Site Held (minutes)  (n/a)  Hemodialysis Catheter Right Subclavian Double lumen Permanent (Tunneled)  No Placement Date or Time found.   Placed prior to admission: Yes  Orientation: Right  Access Location: Subclavian  Hemodialysis Catheter Type: Double lumen Permanent (Tunneled)  Site Condition No complications  Blue Lumen Status Heparin locked  Red Lumen Status Heparin locked  Purple Lumen Status N/A  Catheter fill solution Heparin 1000 units/ml  Catheter fill volume (Arterial) 1.6 cc  Catheter fill volume (Venous) 1.6  Dressing Type Biopatch;Occlusive  Dressing Status Clean;Dry;Intact  Drainage Description None  Post treatment catheter status Capped and Clamped

## 2019-05-05 NOTE — Progress Notes (Signed)
*  PRELIMINARY RESULTS* Echocardiogram 2D Echocardiogram has been performed.  Sherrie Sport 05/05/2019, 9:14 AM

## 2019-05-05 NOTE — Consult Note (Signed)
Reason for Consult: blurry vision  Referring Physician: Dr. Clementeen Graham   CC: blurry vision   HPI: Joseph Mcpherson is an 73 y.o. 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  Pt states vision is worse when looking to the right side.  Pt has as stroke on L side MLF.   Past Medical History:  Diagnosis Date  . Chronic kidney disease   . Clotting disorder ([place])    per daughter, "when patient travels, his feet swell"  . Diabetes mellitus without complication ([place])   . GERD (gastroesophageal reflux disease)   . Hyperlipidemia   . Hypertension   . Hypothyroidism   . Thyroid disease     Past Surgical History:  Procedure Laterality Date  . AV FISTULA PLACEMENT Right 10/22/2017   Procedure: ARTERIOVENOUS (AV) BRACHIOCEPHALIC FISTULA CREATION RIGHT UPPER ARM;  Surgeon: Conrad [place], MD;  Location: [facility] OR;  Service: Vascular;  Laterality: Right;  . AV FISTULA PLACEMENT Left 02/03/2019   Procedure: INSERTION OF ARTERIOVENOUS (AV) GORE-TEX GRAFT ARM (BRACHIAL AXILLARY );  Surgeon: [name], MD;  Location: [facility] ORS;  Service: Vascular;  Laterality: Left;    Family History  Problem Relation Age of Onset  . Diabetes Mother   . Hyperlipidemia Mother   . Hypertension Father     Social History:  reports that he has never smoked. He has quit using smokeless tobacco.  His smokeless tobacco use included snuff. He reports that he does not drink alcohol or use drugs.  No Known Allergies  Medications: I have reviewed the patient's current medications.  ROS: History obtained from the patient  General ROS: negative for - chills, fatigue, fever, night sweats, weight gain or weight loss Psychological ROS: negative for - behavioral disorder, hallucinations, memory difficulties,  mood swings or suicidal ideation Ophthalmic ROS: negative for - blurry vision, double vision, eye pain or loss of vision ENT ROS: negative for - epistaxis, nasal discharge, oral lesions, sore throat, tinnitus or vertigo Allergy and Immunology ROS: negative for - hives or itchy/watery eyes Hematological and Lymphatic ROS: negative for - bleeding problems, bruising or swollen lymph nodes Endocrine ROS: negative for - galactorrhea, hair pattern changes, polydipsia/polyuria or temperature intolerance Respiratory ROS: negative for - cough, hemoptysis, shortness of breath or wheezing Cardiovascular ROS: negative for - chest pain, dyspnea on exertion, edema or irregular heartbeat Gastrointestinal ROS: negative for - abdominal pain, diarrhea, hematemesis, nausea/vomiting or stool incontinence Genito-Urinary ROS: negative for - dysuria, hematuria, incontinence or urinary frequency/urgency Musculoskeletal ROS: negative for - joint swelling or muscular weakness Neurological ROS: as noted in HPI Dermatological ROS: negative for rash and skin lesion changes  Physical Examination: Blood pressure (!) 127/52, pulse (!) 57, temperature 98.8 F (37.1 C), temperature source Oral, resp. rate 16, height 5\' 4"  (1.626 m), weight 69.9 kg, SpO2 98 %.   Neurological Examination   Mental Status: Alert, oriented, thought content appropriate.  Speech fluent without evidence of aphasia.  Able to follow 3 step commands without difficulty. Cranial Nerves: II: Discs flat bilaterally Monocular diplopia when looking to R side.  III,IV, VI: ptosis not present, extra-ocular motions intact bilaterally V,VII: smile symmetric, facial light touch sensation normal bilaterally VIII: hearing normal bilaterally IX,X: gag reflex present XI: bilateral shoulder shrug XII: midline tongue extension Motor: Right : Upper extremity   5/5  Left:     Upper extremity   5/5  Lower extremity   5/5     Lower extremity   5/5 Tone and  bulk:normal tone throughout; no atrophy noted Sensory: Pinprick and light touch intact throughout, bilaterally Deep Tendon Reflexes: 1+ and symmetric throughout Plantars: Right: downgoing   Left: downgoing Cerebellar: normal finger-to-nose      Laboratory Studies:   Basic Metabolic Panel: Recent Labs  Lab 05/04/19 1413 05/04/19 2135  NA 136  --   K 5.2*  --   CL 95*  --   CO2 26  --   GLUCOSE 190*  --   BUN 35*  --   CREATININE 7.42* 7.68*  CALCIUM 9.0  --     Liver Function Tests: No results for input(s): AST, ALT, ALKPHOS, BILITOT, PROT, ALBUMIN in the last 168 hours. No results for input(s): LIPASE, AMYLASE in the last 168 hours. No results for input(s): AMMONIA in the last 168 hours.  CBC: Recent Labs  Lab 05/04/19 1413 05/04/19 2135  WBC 14.2* 11.8*  NEUTROABS 10.5*  --   HGB 11.5* 10.8*  HCT 35.7* 33.2*  MCV 96.0 95.1  PLT 228 236    Cardiac Enzymes: No results for input(s): CKTOTAL, CKMB, CKMBINDEX, TROPONINI in the last 168 hours.  BNP: Invalid input(s): POCBNP  CBG: Recent Labs  Lab 05/04/19 2133 05/04/19 2239 05/05/19 0421 05/05/19 0912  GLUCAP 154* 130* 205* 56    Microbiology: Results for orders placed or performed during the hospital encounter of 05/04/19  SARS CORONAVIRUS 2 (TAT 6-24 HRS) Nasopharyngeal Nasopharyngeal Swab     Status: None   Collection Time: 05/04/19  9:35 PM   Specimen: Nasopharyngeal Swab  Result Value Ref Range Status   SARS Coronavirus 2 NEGATIVE NEGATIVE Final    Comment: (NOTE) SARS-CoV-2 target nucleic acids are NOT DETECTED. The SARS-CoV-2 RNA is generally detectable in upper and lower respiratory specimens during the acute phase of infection. Negative results do not preclude SARS-CoV-2 infection, do not rule out co-infections with other pathogens, and should not be used as the sole basis for treatment or other patient management decisions. Negative results must be combined with clinical  observations, patient history, and epidemiological information. The expected result is Negative. Fact Sheet for Patients: SugarRoll.be Fact Sheet for Healthcare Providers: https://www.woods-mathews.com/ This test is not yet approved or cleared by the [place] FDA and  has been authorized for detection and/or diagnosis of SARS-CoV-2 by FDA under an Emergency Use Authorization (EUA). This EUA will remain  in effect (meaning this test can be used) for the duration of the COVID-19 declaration under Section 56 4(b)(1) of the Act, 21 U.S.C. section 360bbb-3(b)(1), unless the authorization is terminated or revoked sooner. Performed at [facility], [place] [address] [place], [place] [address]   MRSA PCR Screening     Status: None   Collection Time: 05/05/19 12:41 AM   Specimen: Nasopharyngeal  Result Value Ref Range Status   MRSA by PCR NEGATIVE NEGATIVE Final    Comment:        The GeneXpert MRSA Assay (FDA approved for NASAL specimens only), is one component of a comprehensive MRSA colonization surveillance program. It is not intended to diagnose MRSA infection nor to guide or monitor treatment for MRSA infections. Performed at [facility], [address] [place], [place] [address]     Coagulation Studies: No results for input(s): LABPROT, INR in the last 72 hours.  Urinalysis: No results for input(s): COLORURINE,  LABSPEC, PHURINE, GLUCOSEU, HGBUR, BILIRUBINUR, KETONESUR, PROTEINUR, UROBILINOGEN, NITRITE, LEUKOCYTESUR in the last 168 hours.  Invalid input(s): APPERANCEUR  Lipid Panel:     Component Value Date/Time   CHOL 144 05/05/2019 0634   TRIG 301 (H) 05/05/2019 0634   HDL 29 (L) 05/05/2019 0634   CHOLHDL 5.0 05/05/2019 0634   VLDL 60 (H) 05/05/2019 0634   LDLCALC 55 05/05/2019 0634    HgbA1C:  Lab Results  Component Value Date   HGBA1C 13.2 (H) 09/12/2013    Urine Drug Screen:  No results found  for: LABOPIA, COCAINSCRNUR, LABBENZ, AMPHETMU, THCU, LABBARB  Alcohol Level: No results for input(s): ETH in the last 168 hours.  Other results: EKG: normal EKG, normal sinus rhythm, unchanged from previous tracings.  Imaging: DG Chest 2 View  Result Date: 05/04/2019 CLINICAL DATA:  Dizziness. EXAM: CHEST - 2 VIEW COMPARISON:  01/21/2018 FINDINGS: There is a well-positioned tunneled dialysis catheter on the right. The heart size is stable from prior study. There is no pneumothorax or significant pleural effusion. No large focal infiltrate. There is no acute osseous abnormality. IMPRESSION: No acute cardiopulmonary process. Electronically Signed   By: Constance Holster M.D.   On: 05/04/2019 20:05   MR Brain Wo Contrast (neuro protocol)  Result Date: 05/04/2019 CLINICAL DATA:  Double vision EXAM: MRI HEAD AND ORBITS WITHOUT CONTRAST TECHNIQUE: Multiplanar, multiecho pulse sequences of the brain and surrounding structures were obtained without intravenous contrast. Multiplanar, multiecho pulse sequences of the orbits and surrounding structures were obtained including fat saturation techniques, without intravenous contrast administration. COMPARISON:  09/12/2013 FINDINGS: MRI HEAD FINDINGS Brain: Acute punctate infarction in the medial longitudinal fasciculus region of the dorsal pons on the left could certainly explain double vision. No other acute brain infarction. Old small 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 Mild chronic small-vessel ischemic changes affect the cerebral hemispheric white matter elsewhere. No other large vessel territory infarction. No mass lesion, hemorrhage, hydrocephalus or extra-axial collection. Vascular: Major vessels at the base of the brain show flow. Skull and upper cervical spine: Negative Other: Retention cysts of the maxillary sinuses. MRI ORBITS FINDINGS Orbits: Both globes appear  normal. Previous lens implants. Optic nerves are normal. Extra-ocular muscles are normal. Orbital fat is normal. Lacrimal glands are normal. Visualized sinuses: Retention cysts as noted above. Soft tissues: Otherwise negative Limited intracranial: See results of brain study above. IMPRESSION: Acute infarction of the medial longitudinal fasciculus region of the dorsal pons on the left which could certainly explain double vision. 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 Mild chronic small-vessel change elsewhere affecting the brain as described above. No primary orbital pathology seen. Electronically Signed   By: Nelson Chimes M.D.   On: 05/04/2019 17:12   US Carotid Bilateral (at [facility] and [facility] only)  Result Date: 05/05/2019 CLINICAL DATA:  73 year old male with a stroke EXAM: BILATERAL CAROTID DUPLEX ULTRASOUND TECHNIQUE: Pearline Cables scale imaging, color Doppler and duplex ultrasound were performed of bilateral carotid and vertebral arteries in the neck. COMPARISON:  09/12/2013 FINDINGS: Criteria: Quantification of carotid stenosis is based on velocity parameters that correlate the residual internal carotid diameter with NASCET-based stenosis levels, using the diameter of the distal internal carotid lumen as the denominator for stenosis measurement. The following velocity measurements were obtained: RIGHT ICA:  Systolic 009 cm/sec, Diastolic 23 cm/sec CCA:  97 cm/sec SYSTOLIC ICA/CCA RATIO:  1.0 ECA:  152 cm/sec LEFT ICA:  Systolic 77 cm/sec, Diastolic 18 cm/sec CCA:  87 cm/sec SYSTOLIC ICA/CCA RATIO:  0.9 ECA:  137 cm/sec Right Brachial SBP: Not acquired Left Brachial SBP: Not acquired RIGHT CAROTID ARTERY: No significant calcifications of the right common carotid artery. Intermediate waveform maintained. Heterogeneous and partially calcified plaque at the right carotid bifurcation. No significant lumen shadowing. Low resistance waveform of the right ICA. Mild tortuosity RIGHT VERTEBRAL ARTERY: Antegrade flow with low resistance  waveform. LEFT CAROTID ARTERY: No significant calcifications of the left common carotid artery. Intermediate waveform maintained. Heterogeneous and partially calcified plaque at the left carotid bifurcation without significant lumen shadowing. Low resistance waveform of the left ICA. Mild tortuosity LEFT VERTEBRAL ARTERY:  Antegrade flow with low resistance waveform. IMPRESSION: Color duplex indicates minimal heterogeneous and calcified plaque, with no hemodynamically significant stenosis by duplex criteria in the extracranial cerebrovascular circulation. Signed, Dulcy Fanny. Dellia Nims, RPVI [facility] Electronically Signed   By: Corrie Mckusick D.O.   On: 05/05/2019 09:09   MR ORBITS WO CONTRAST  Result Date: 05/04/2019 CLINICAL DATA:  Double vision EXAM: MRI HEAD AND ORBITS WITHOUT CONTRAST TECHNIQUE: Multiplanar, multiecho pulse sequences of the brain and surrounding structures were obtained without intravenous contrast. Multiplanar, multiecho pulse sequences of the orbits and surrounding structures were obtained including fat saturation techniques, without intravenous contrast administration. COMPARISON:  09/12/2013 FINDINGS: MRI HEAD FINDINGS Brain: Acute punctate infarction in the medial longitudinal fasciculus region of the dorsal pons on the left could certainly explain double vision. No other acute brain infarction. Old small 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 Mild chronic small-vessel ischemic changes affect the cerebral hemispheric white matter elsewhere. No other large vessel territory infarction. No mass lesion, hemorrhage, hydrocephalus or extra-axial collection. Vascular: Major vessels at the base of the brain show flow. Skull and upper cervical spine: Negative Other: Retention cysts of the maxillary sinuses. MRI ORBITS FINDINGS Orbits: Both globes  appear normal. Previous lens implants. Optic nerves are normal. Extra-ocular muscles are normal. Orbital fat is normal. Lacrimal glands are normal. Visualized sinuses: Retention cysts as noted above. Soft tissues: Otherwise negative Limited intracranial: See results of brain study above. IMPRESSION: Acute infarction of the medial longitudinal fasciculus region of the dorsal pons on the left which could certainly explain double vision. 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 Mild chronic small-vessel change elsewhere affecting the brain as described above. No primary orbital pathology seen. Electronically Signed   By: Nelson Chimes M.D.   On: 05/04/2019 17:12     Assessment/Plan: 73 y.o. 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  Pt states vision is worse when looking to the right side.  Pt has as stroke on L side MLF.  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   - ASA 81 mg on admission - Increase ASA to 325 on d/c - Statin - pt/ot - d/c planning - call with questions.  05/05/2019, 10:23 AM

## 2019-05-05 NOTE — Progress Notes (Signed)
HD Initiated   05/05/19 1201  Vital Signs  Temp (!) 97.5 F (36.4 C)  Temp Source Oral  Pulse Rate (!) 56  Pulse Rate Source Dinamap  Resp 16  BP (!) 153/83  BP Location Right Arm  BP Method Automatic  Patient Position (if appropriate) Lying  Oxygen Therapy  SpO2 100 %  O2 Device Room Air  Pain Assessment  Pain Scale 0-10  Pain Score 0  During Hemodialysis Assessment  Blood Flow Rate (mL/min) 400 mL/min  Arterial Pressure (mmHg) -150 mmHg  Venous Pressure (mmHg) 120 mmHg  Transmembrane Pressure (mmHg) 70 mmHg  Ultrafiltration Rate (mL/min) 430 mL/min  Dialysate Flow Rate (mL/min) 600 ml/min  Conductivity: Machine  13.8  HD Safety Checks Performed Yes  Dialysis Fluid Bolus Normal Saline  Bolus Amount (mL) 250 mL  Intra-Hemodialysis Comments Tx initiated

## 2019-05-05 NOTE — Progress Notes (Signed)
HD Complete   05/05/19 1529  Vital Signs  Temp 98.6 F (37 C)  Temp Source Oral  Pulse Rate 66  Pulse Rate Source Dinamap  Resp 15  BP (!) 139/57  BP Location Right Arm  BP Method Automatic  Patient Position (if appropriate) Lying  Oxygen Therapy  SpO2 100 %  Pain Assessment  Pain Scale 0-10  During Hemodialysis Assessment  KECN 74.5 KECN  Dialysis Fluid Bolus Normal Saline  Bolus Amount (mL) 250 mL  Intra-Hemodialysis Comments Tx completed (End Tx now per pt statement)

## 2019-05-05 NOTE — Progress Notes (Signed)
SLP Cancellation Note  Patient Details Name: Joseph Mcpherson MRN: [id number] DOB: 04/08/1946   Cancelled treatment:       Reason Eval/Treat Not Completed: SLP screened, no needs identified, will sign off(chart reviewed; consulted NSG ) Pt denies any difficulty swallowing and is currently on a regular diet; tolerates swallowing pills w/ water per NSG. Pt converses at conversational level w/out deficits noted; pt and NSG denied any speech-language deficits. Pt was admitted for evaluation of double vision w/ No other c/o speech/swallowing deficits reported.  No further skilled ST services indicated as pt appears at his baseline. Pt agreed. NSG to reconsult if any change in status.     Orinda Kenner, [place], CCC-SLP Navaeh Kehres 05/05/2019, 1:07 PM

## 2019-05-05 NOTE — Consult Note (Signed)
[address] [place], [place] [address] Phone [phone]. Fax [phone]  Date: 05/05/2019                  Patient Name:  Joseph Mcpherson  MRN: [id number]  DOB: 1946-03-09  Age / Sex: 73 y.o., male         PCP: [name], MD                 Service Requesting Consult: IM/ Dhungel, Flonnie Overman, MD                 Reason for Consult: ESRD            History of Present Illness: Patient is a 73 y.o. male with medical problems of HTN, DM, stroke, ESRD, who was admitted to [facility] on 05/04/2019 for evaluation of double vision.  Dx with stroke Nephrology consult for ESRD mgmt Outpatient prescription Order Date/Time January 23, 2019  Frequency 3X Week  Treatment Days MonWedFri  Dialyzer 180NRe Optiflux  Treatment Time (Total Minutes) 225 min  Blood Flow Rate (mL/min) 350 mL/min  Dialysate Flow Rate Autoflow 1.5  Estimated Dry Weight 67.0 kg  Dialysate Concentrate 2.0 K, 2.5 Ca, 1.0 Mg, 100 Dextrose (N2251)  Sodium (mEq/L) 137 meq/L  Bicarb Machine Setting (mEq/L) 35 meq/L  Dialysis Access Hemodialysis-CV Catheter-Non Tunneled, Chest      Medications: Outpatient medications: Medications Prior to Admission  Medication Sig Dispense Refill Last Dose  . aspirin EC 81 MG tablet Take 81 mg by mouth daily.    05/04/2019 at Unknown time  . B Complex-C-Folic Acid (RENA-VITE RX) 1 MG TABS Take 1 tablet by mouth daily.    05/04/2019 at Unknown time  . Cholecalciferol (VITAMIN D3) 5000 units TABS Take 5,000 Units by mouth daily.    05/04/2019 at Unknown time  . docusate sodium (COLACE) 100 MG capsule Take 100 mg by mouth 2 (two) times daily.    05/04/2019 at Unknown time  . donepezil (ARICEPT) 10 MG tablet Take 10 mg by mouth at bedtime.    05/03/2019 at Unknown time  . gabapentin (NEURONTIN) 300 MG capsule Take 300 mg by mouth at bedtime.    05/03/2019 at Unknown time  . hydrALAZINE (APRESOLINE) 50 MG tablet Take 50 mg by mouth 3 (three) times daily.    05/04/2019 at Unknown time   . insulin aspart (NOVOLOG FLEXPEN) 100 UNIT/ML FlexPen Inject 8 Units into the skin 2 (two) times daily.    05/04/2019 at Unknown time  . insulin detemir (LEVEMIR) 100 UNIT/ML injection Inject 16 Units into the skin at bedtime.    05/03/2019 at Unknown time  . levothyroxine (SYNTHROID, LEVOTHROID) 137 MCG tablet Take 137 mcg by mouth daily before breakfast.   05/04/2019 at Unknown time  . losartan (COZAAR) 25 MG tablet Take 25 mg by mouth every evening.    05/04/2019 at Unknown time  . metoprolol succinate (TOPROL-XL) 50 MG 24 hr tablet Take 50 mg by mouth daily.    05/04/2019 at Unknown time  . omeprazole (PRILOSEC) 40 MG capsule Take 40 mg by mouth daily.    05/04/2019 at Unknown time  . sevelamer carbonate (RENVELA) 2.4 g PACK Take 2.4 g by mouth 3 (three) times daily.   05/04/2019 at Unknown time  . simvastatin (ZOCOR) 20 MG tablet Take 20 mg by mouth every evening.    05/03/2019 at Unknown time  . sitaGLIPtin (JANUVIA) 50 MG tablet Take 50 mg by mouth every evening.    05/03/2019  at Unknown time  . HYDROcodone-acetaminophen (NORCO) 5-325 MG tablet Take 1-2 tablets by mouth every 6 (six) hours as needed for moderate pain or severe pain. (Patient not taking: Reported on 05/04/2019) 25 tablet 0 Not Taking at Unknown time    Current medications: Current Facility-Administered Medications  Medication Dose Route Frequency Provider Last Rate Last Admin  . acetaminophen (TYLENOL) tablet 650 mg  650 mg Oral Q4H PRN Athena Masse, MD       Or  . acetaminophen (TYLENOL) 160 MG/5ML solution 650 mg  650 mg Per Tube Q4H PRN Athena Masse, MD       Or  . acetaminophen (TYLENOL) suppository 650 mg  650 mg Rectal Q4H PRN Athena Masse, MD      . Chlorhexidine Gluconate Cloth 2 % PADS 6 each  6 each Topical Q0600 Murlean Iba, MD      . cholecalciferol (VITAMIN D3) tablet 5,000 Units  5,000 Units Oral Daily Athena Masse, MD   5,000 Units at 05/05/19 [phone]  . clopidogrel (PLAVIX) tablet 75 mg  75 mg  Oral Daily Athena Masse, MD   75 mg at 05/05/19 0926  . docusate sodium (COLACE) capsule 100 mg  100 mg Oral BID Athena Masse, MD   100 mg at 05/05/19 3220  . donepezil (ARICEPT) tablet 10 mg  10 mg Oral QHS Judd Gaudier V, MD   10 mg at 05/05/19 0014  . heparin injection 5,000 Units  5,000 Units Subcutaneous Q8H Athena Masse, MD   5,000 Units at 05/05/19 2542  . hydrALAZINE (APRESOLINE) tablet 50 mg  50 mg Oral TID Athena Masse, MD   50 mg at 05/05/19 7062  . insulin aspart (novoLOG) injection 0-6 Units  0-6 Units Subcutaneous Q4H Athena Masse, MD   2 Units at 05/05/19 [phone]  . insulin detemir (LEVEMIR) injection 16 Units  16 Units Subcutaneous QHS Athena Masse, MD   16 Units at 05/05/19 0014  . levothyroxine (SYNTHROID) tablet 137 mcg  137 mcg Oral Q0600 Athena Masse, MD   137 mcg at 05/05/19 [phone]  . losartan (COZAAR) tablet 25 mg  25 mg Oral QPM Judd Gaudier V, MD      . metoprolol succinate (TOPROL-XL) 24 hr tablet 50 mg  50 mg Oral Daily Athena Masse, MD   50 mg at 05/05/19 0927  . multivitamin (RENA-VIT) tablet 1 tablet  1 tablet Oral Daily Athena Masse, MD   1 tablet at 05/05/19 [phone]  . pantoprazole (PROTONIX) EC tablet 40 mg  40 mg Oral Daily Athena Masse, MD   40 mg at 05/05/19 0929  . senna-docusate (Senokot-S) tablet 1 tablet  1 tablet Oral QHS PRN Athena Masse, MD      . sevelamer carbonate (RENVELA) powder PACK 2.4 g  2.4 g Oral TID WC Athena Masse, MD   2.4 g at 05/05/19 0928  . simvastatin (ZOCOR) tablet 40 mg  40 mg Oral q1800 Athena Masse, MD   40 mg at 05/05/19 0014      Allergies: No Known Allergies    Past Medical History: Past Medical History:  Diagnosis Date  . Chronic kidney disease   . Clotting disorder ([place])    per daughter, "when patient travels, his feet swell"  . Diabetes mellitus without complication ([place])   . GERD (gastroesophageal reflux disease)   . Hyperlipidemia   . Hypertension   . Hypothyroidism   . Thyroid  disease      Past Surgical History: Past Surgical History:  Procedure Laterality Date  . AV FISTULA PLACEMENT Right 10/22/2017   Procedure: ARTERIOVENOUS (AV) BRACHIOCEPHALIC FISTULA CREATION RIGHT UPPER ARM;  Surgeon: Conrad , MD;  Location: [facility] OR;  Service: Vascular;  Laterality: Right;  . AV FISTULA PLACEMENT Left 02/03/2019   Procedure: INSERTION OF ARTERIOVENOUS (AV) GORE-TEX GRAFT ARM (BRACHIAL AXILLARY );  Surgeon: [name], MD;  Location: [facility] ORS;  Service: Vascular;  Laterality: Left;     Family History: Family History  Problem Relation Age of Onset  . Diabetes Mother   . Hyperlipidemia Mother   . Hypertension Father      Social History: Social History   Socioeconomic History  . Marital status: Married    Spouse name: Not on file  . Number of children: Not on file  . Years of education: Not on file  . Highest education level: Not on file  Occupational History  . Not on file  Tobacco Use  . Smoking status: Never Smoker  . Smokeless tobacco: Former Systems developer    Types: Snuff  Substance and Sexual Activity  . Alcohol use: No  . Drug use: No  . Sexual activity: Not Currently  Other Topics Concern  . Not on file  Social History Narrative  . Not on file   Social Determinants of Health   Financial Resource Strain:   . Difficulty of Paying Living Expenses: Not on file  Food Insecurity:   . Worried About Charity fundraiser in the Last Year: Not on file  . Ran Out of Food in the Last Year: Not on file  Transportation Needs:   . Lack of Transportation (Medical): Not on file  . Lack of Transportation (Non-Medical): Not on file  Physical Activity:   . Days of Exercise per Week: Not on file  . Minutes of Exercise per Session: Not on file  Stress:   . Feeling of Stress : Not on file  Social Connections:   . Frequency of Communication with Friends and Family: Not on file  . Frequency of Social Gatherings with Friends and Family: Not on file  .  Attends Religious Services: Not on file  . Active Member of Clubs or Organizations: Not on file  . Attends Archivist Meetings: Not on file  . Marital Status: Not on file  Intimate Partner Violence:   . Fear of Current or Ex-Partner: Not on file  . Emotionally Abused: Not on file  . Physically Abused: Not on file  . Sexually Abused: Not on file    Gen: Denies any fevers or chills HEENT: double vision CV: No chest pain or shortness of breath Resp: No cough or sputum production GI: No nausea, vomiting or diarrhea.  No blood in the stool GU : No problems with voiding.  No hematuria.  MS: Ambulatory.  Denies any acute joint pain or swelling Derm:   No complaints Psych: No complaints Heme: No complaints Neuro: Diagnosed with stroke. Endocrine: No complaints  Vital Signs: Blood pressure (!) 118/55, pulse 60, temperature 98.5 F (36.9 C), temperature source Oral, resp. rate 16, height 5\' 4"  (1.626 m), weight 69.9 kg, SpO2 100 %.   Intake/Output Summary (Last 24 hours) at 05/05/2019 1135 Last data filed at 05/05/2019 1003 Gross per 24 hour  Intake --  Output 1 ml  Net -1 ml    Weight trends: Filed Weights   05/04/19 1354  Weight: 69.9 kg  Physical Exam: General:  No acute distress, laying in the bed  HEENT  anicteric, moist oral mucous membranes  Lungs:  Clear to auscultation bilaterally  Heart::  Regular rhythm, no rub  Abdomen:  Soft, nontender  Extremities:  No peripheral edema  Neurologic:  Alert, oriented  Skin:  No acute rashes  Access:  Left upper arm AV graft, right IJ PermCath    Lab results: Basic Metabolic Panel: Recent Labs  Lab 05/04/19 1413 05/04/19 2135  NA 136  --   K 5.2*  --   CL 95*  --   CO2 26  --   GLUCOSE 190*  --   BUN 35*  --   CREATININE 7.42* 7.68*  CALCIUM 9.0  --     Liver Function Tests: No results for input(s): AST, ALT, ALKPHOS, BILITOT, PROT, ALBUMIN in the last 168 hours. No results for input(s): LIPASE,  AMYLASE in the last 168 hours. No results for input(s): AMMONIA in the last 168 hours.  CBC: Recent Labs  Lab 05/04/19 1413 05/04/19 2135  WBC 14.2* 11.8*  NEUTROABS 10.5*  --   HGB 11.5* 10.8*  HCT 35.7* 33.2*  MCV 96.0 95.1  PLT 228 236    Cardiac Enzymes: No results for input(s): CKTOTAL, TROPONINI in the last 168 hours.  BNP: Invalid input(s): POCBNP  CBG: Recent Labs  Lab 05/04/19 2133 05/04/19 2239 05/05/19 0421 05/05/19 0912  GLUCAP 154* 130* 205* 97    Microbiology: Recent Results (from the past 720 hour(s))  SARS CORONAVIRUS 2 (TAT 6-24 HRS) Nasopharyngeal Nasopharyngeal Swab     Status: None   Collection Time: 05/04/19  9:35 PM   Specimen: Nasopharyngeal Swab  Result Value Ref Range Status   SARS Coronavirus 2 NEGATIVE NEGATIVE Final    Comment: (NOTE) SARS-CoV-2 target nucleic acids are NOT DETECTED. The SARS-CoV-2 RNA is generally detectable in upper and lower respiratory specimens during the acute phase of infection. Negative results do not preclude SARS-CoV-2 infection, do not rule out co-infections with other pathogens, and should not be used as the sole basis for treatment or other patient management decisions. Negative results must be combined with clinical observations, patient history, and epidemiological information. The expected result is Negative. Fact Sheet for Patients: SugarRoll.be Fact Sheet for Healthcare Providers: https://www.woods-mathews.com/ This test is not yet approved or cleared by the [place] FDA and  has been authorized for detection and/or diagnosis of SARS-CoV-2 by FDA under an Emergency Use Authorization (EUA). This EUA will remain  in effect (meaning this test can be used) for the duration of the COVID-19 declaration under Section 56 4(b)(1) of the Act, 21 U.S.C. section 360bbb-3(b)(1), unless the authorization is terminated or revoked sooner. Performed at [facility], [place] [address], [place], [name] [address]   MRSA PCR Screening     Status: None   Collection Time: 05/05/19 12:41 AM   Specimen: Nasopharyngeal  Result Value Ref Range Status   MRSA by PCR NEGATIVE NEGATIVE Final    Comment:        The GeneXpert MRSA Assay (FDA approved for NASAL specimens only), is one component of a comprehensive MRSA colonization surveillance program. It is not intended to diagnose MRSA infection nor to guide or monitor treatment for MRSA infections. Performed at [facility], [address] [place], [place] [address]      Coagulation Studies: No results for input(s): LABPROT, INR in the last 72 hours.  Urinalysis: No results for input(s): COLORURINE, LABSPEC, [place],  GLUCOSEU, HGBUR, BILIRUBINUR, KETONESUR, PROTEINUR, UROBILINOGEN, NITRITE, LEUKOCYTESUR in the last 72 hours.  Invalid input(s): APPERANCEUR      Imaging: DG Chest 2 View  Result Date: 05/04/2019 CLINICAL DATA:  Dizziness. EXAM: CHEST - 2 VIEW COMPARISON:  01/21/2018 FINDINGS: There is a well-positioned tunneled dialysis catheter on the right. The heart size is stable from prior study. There is no pneumothorax or significant pleural effusion. No large focal infiltrate. There is no acute osseous abnormality. IMPRESSION: No acute cardiopulmonary process. Electronically Signed   By: Constance Holster M.D.   On: 05/04/2019 20:05   MR Brain Wo Contrast (neuro protocol)  Result Date: 05/04/2019 CLINICAL DATA:  Double vision EXAM: MRI HEAD AND ORBITS WITHOUT CONTRAST TECHNIQUE: Multiplanar, multiecho pulse sequences of the brain and surrounding structures were obtained without intravenous contrast. Multiplanar, multiecho pulse sequences of the orbits and surrounding structures were obtained including fat saturation techniques, without intravenous contrast administration. COMPARISON:  09/12/2013 FINDINGS: MRI HEAD FINDINGS Brain: Acute punctate infarction in the medial  longitudinal fasciculus region of the dorsal pons on the left could certainly explain double vision. No other acute brain infarction. Old small 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 Mild chronic small-vessel ischemic changes affect the cerebral hemispheric white matter elsewhere. No other large vessel territory infarction. No mass lesion, hemorrhage, hydrocephalus or extra-axial collection. Vascular: Major vessels at the base of the brain show flow. Skull and upper cervical spine: Negative Other: Retention cysts of the maxillary sinuses. MRI ORBITS FINDINGS Orbits: Both globes appear normal. Previous lens implants. Optic nerves are normal. Extra-ocular muscles are normal. Orbital fat is normal. Lacrimal glands are normal. Visualized sinuses: Retention cysts as noted above. Soft tissues: Otherwise negative Limited intracranial: See results of brain study above. IMPRESSION: Acute infarction of the medial longitudinal fasciculus region of the dorsal pons on the left which could certainly explain double vision. 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 Mild chronic small-vessel change elsewhere affecting the brain as described above. No primary orbital pathology seen. Electronically Signed   By: Nelson Chimes M.D.   On: 05/04/2019 17:12   US Carotid Bilateral (at [facility] and [facility] only)  Result Date: 05/05/2019 CLINICAL DATA:  73 year old male with a stroke EXAM: BILATERAL CAROTID DUPLEX ULTRASOUND TECHNIQUE: Pearline Cables scale imaging, color Doppler and duplex ultrasound were performed of bilateral carotid and vertebral arteries in the neck. COMPARISON:  09/12/2013 FINDINGS: Criteria: Quantification of carotid stenosis is based on velocity parameters that correlate the residual internal carotid diameter with NASCET-based stenosis levels, using the diameter of the distal internal carotid lumen as the denominator for stenosis measurement. The following  velocity measurements were obtained: RIGHT ICA:  Systolic 751 cm/sec, Diastolic 23 cm/sec CCA:  97 cm/sec SYSTOLIC ICA/CCA RATIO:  1.0 ECA:  152 cm/sec LEFT ICA:  Systolic 77 cm/sec, Diastolic 18 cm/sec CCA:  87 cm/sec SYSTOLIC ICA/CCA RATIO:  0.9 ECA:  137 cm/sec Right Brachial SBP: Not acquired Left Brachial SBP: Not acquired RIGHT CAROTID ARTERY: No significant calcifications of the right common carotid artery. Intermediate waveform maintained. Heterogeneous and partially calcified plaque at the right carotid bifurcation. No significant lumen shadowing. Low resistance waveform of the right ICA. Mild tortuosity RIGHT VERTEBRAL ARTERY: Antegrade flow with low resistance waveform. LEFT CAROTID ARTERY: No significant calcifications of the left common carotid artery. Intermediate waveform maintained. Heterogeneous and partially calcified plaque at the left carotid bifurcation without significant lumen shadowing. Low resistance waveform of the left ICA. Mild tortuosity  LEFT VERTEBRAL ARTERY:  Antegrade flow with low resistance waveform. IMPRESSION: Color duplex indicates minimal heterogeneous and calcified plaque, with no hemodynamically significant stenosis by duplex criteria in the extracranial cerebrovascular circulation. Signed, Dulcy Fanny. Dellia Nims, RPVI [facility] Electronically Signed   By: Corrie Mckusick D.O.   On: 05/05/2019 09:09   ECHOCARDIOGRAM COMPLETE  Result Date: 05/05/2019   ECHOCARDIOGRAM REPORT   Patient Name:   DAEVION NAVARETTE Date of Exam: 05/05/2019 Medical Rec #:  [id number]   Height:       64.0 in Accession #:    [id number]  Weight:       154.0 lb Date of Birth:  1946-04-06    BSA:          1.75 m Patient Age:    61 years    BP:           127/52 mmHg Patient Gender: M           HR:           57 bpm. Exam Location:  [facility] Procedure: 2D Echo, Cardiac Doppler and Color Doppler Indications:     Stroke 434.91  History:         Patient has no  prior history of Echocardiogram examinations.                  Risk Factors:Hypertension and Diabetes.  Sonographer:     Sherrie Sport RDCS (AE) Referring Phys:  [id number] Athena Masse Diagnosing Phys: Bartholome Bill MD IMPRESSIONS  1. Left ventricular ejection fraction, by visual estimation, is 60 to 65%. The left ventricle has normal function. Left ventricular septal wall thickness was mildly increased. Mildly increased left ventricular posterior wall thickness. There is borderline left ventricular hypertrophy.  2. Global right ventricle has normal systolic function.The right ventricular size is mildly enlarged. No increase in right ventricular wall thickness.  3. Left atrial size was mildly dilated.  4. Right atrial size was mildly dilated.  5. The mitral valve is grossly normal. Trivial mitral valve regurgitation.  6. The tricuspid valve is grossly normal. Tricuspid valve regurgitation is mild.  7. The aortic valve was not well visualized. Aortic valve regurgitation is not visualized.  8. The pulmonic valve was not well visualized. Pulmonic valve regurgitation is trivial.  9. Mildly elevated pulmonary artery systolic pressure. 10. The atrial septum is grossly normal. FINDINGS  Left Ventricle: Left ventricular ejection fraction, by visual estimation, is 60 to 65%. The left ventricle has normal function. The left ventricle is not well visualized. Mildly increased left ventricular posterior wall thickness. There is borderline left ventricular hypertrophy. Left ventricular diastolic parameters were normal. Right Ventricle: The right ventricular size is mildly enlarged. No increase in right ventricular wall thickness. Global RV systolic function is has normal systolic function. The tricuspid regurgitant velocity is 2.36 m/s, and with an assumed right atrial  pressure of 10 mmHg, the estimated right ventricular systolic pressure is mildly elevated at 32.3 mmHg. Left Atrium: Left atrial size was mildly dilated. Right  Atrium: Right atrial size was mildly dilated Pericardium: There is no evidence of pericardial effusion. Mitral Valve: The mitral valve is grossly normal. Trivial mitral valve regurgitation. Tricuspid Valve: The tricuspid valve is grossly normal. Tricuspid valve regurgitation is mild. Aortic Valve: The aortic valve was not well visualized. Aortic valve regurgitation is not visualized. Aortic valve mean gradient measures 4.0 mmHg. Aortic valve peak gradient measures 6.9 mmHg. Aortic valve area, by VTI measures  2.77 cm. Pulmonic Valve: The pulmonic valve was not well visualized. Pulmonic valve regurgitation is trivial. Pulmonic regurgitation is trivial. Aorta: The aortic root is normal in size and structure. IAS/Shunts: The atrial septum is grossly normal.  LEFT VENTRICLE PLAX 2D LVIDd:         4.21 cm  Diastology LVIDs:         2.41 cm  LV e' lateral:   6.74 cm/s LV PW:         1.18 cm  LV E/e' lateral: 15.0 LV IVS:        1.06 cm  LV e' medial:    5.22 cm/s LVOT diam:     2.00 cm  LV E/e' medial:  19.3 LV SV:         59 ml LV SV Index:   32.73 LVOT Area:     3.14 cm  RIGHT VENTRICLE RV Basal diam:  2.65 cm RV S prime:     10.10 cm/s TAPSE (M-mode): 3.2 cm LEFT ATRIUM             Index       RIGHT ATRIUM          Index LA diam:        3.80 cm 2.17 cm/m  RA Area:     7.76 cm LA Vol (A2C):   41.0 ml 23.42 ml/m RA Volume:   11.70 ml 6.68 ml/m LA Vol (A4C):   47.1 ml 26.90 ml/m LA Biplane Vol: 45.6 ml 26.05 ml/m  AORTIC VALVE                   PULMONIC VALVE AV Area (Vmax):    2.35 cm    PV Vmax:        1.04 m/s AV Area (Vmean):   2.32 cm    PV Peak grad:   4.3 mmHg AV Area (VTI):     2.77 cm    RVOT Peak grad: 4 mmHg AV Vmax:           131.50 cm/s AV Vmean:          91.450 cm/s AV VTI:            0.306 m AV Peak Grad:      6.9 mmHg AV Mean Grad:      4.0 mmHg LVOT Vmax:         98.30 cm/s LVOT Vmean:        67.400 cm/s LVOT VTI:          0.269 m LVOT/AV VTI ratio: 0.88  AORTA Ao Root diam: 2.60 cm MITRAL VALVE                          TRICUSPID VALVE MV Area (PHT): 3.60 cm              TR Peak grad:   22.3 mmHg MV PHT:        61.19 msec            TR Vmax:        236.00 cm/s MV Decel Time: 211 msec MV E velocity: 101.00 cm/s 103 cm/s  SHUNTS MV A velocity: 94.10 cm/s  70.3 cm/s Systemic VTI:  0.27 m MV E/A ratio:  1.07        1.5       Systemic Diam: 2.00 cm  Bartholome Bill MD Electronically signed by Bartholome Bill MD Signature Date/Time: 05/05/2019/11:14:35 AM  Final    MR ORBITS WO CONTRAST  Result Date: 05/04/2019 CLINICAL DATA:  Double vision EXAM: MRI HEAD AND ORBITS WITHOUT CONTRAST TECHNIQUE: Multiplanar, multiecho pulse sequences of the brain and surrounding structures were obtained without intravenous contrast. Multiplanar, multiecho pulse sequences of the orbits and surrounding structures were obtained including fat saturation techniques, without intravenous contrast administration. COMPARISON:  09/12/2013 FINDINGS: MRI HEAD FINDINGS Brain: Acute punctate infarction in the medial longitudinal fasciculus region of the dorsal pons on the left could certainly explain double vision. No other acute brain infarction. Old small 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 Mild chronic small-vessel ischemic changes affect the cerebral hemispheric white matter elsewhere. No other large vessel territory infarction. No mass lesion, hemorrhage, hydrocephalus or extra-axial collection. Vascular: Major vessels at the base of the brain show flow. Skull and upper cervical spine: Negative Other: Retention cysts of the maxillary sinuses. MRI ORBITS FINDINGS Orbits: Both globes appear normal. Previous lens implants. Optic nerves are normal. Extra-ocular muscles are normal. Orbital fat is normal. Lacrimal glands are normal. Visualized sinuses: Retention cysts as noted above. Soft tissues: Otherwise negative Limited intracranial: See results of  brain study above. IMPRESSION: Acute infarction of the medial longitudinal fasciculus region of the dorsal pons on the left which could certainly explain double vision. 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 Mild chronic small-vessel change elsewhere affecting the brain as described above. No primary orbital pathology seen. Electronically Signed   By: Nelson Chimes M.D.   On: 05/04/2019 17:12      Assessment & Plan: Pt is a 73 y.o.   male with HTN, IDDM type 2, ESRD on HD, hypothyroidism,stroke, was admitted on 05/04/2019 with stroke in pons.    Order Date/Time January 23, 2019  Frequency 3X Week  Treatment Days MonWedFri  Dialyzer 180NRe Optiflux  Treatment Time (Total Minutes) 225 min  Blood Flow Rate (mL/min) 350 mL/min  Dialysate Flow Rate Autoflow 1.5  Estimated Dry Weight 67.0 kg  Dialysate Concentrate 2.0 K, 2.5 Ca, 1.0 Mg, 100 Dextrose (N2251)  Sodium (mEq/L) 137 meq/L  Bicarb Machine Setting (mEq/L) 35 meq/L  Dialysis Access Hemodialysis-CV Catheter-Non Tunneled, Chest     # ESRD, MWF Dialysis today  # AOCKD - hold EPO due to recent stroke  # Secondary HPTH Home dose of binders sevelamer 2.4 g TID   # recent stroke, pons Double vision Antiplatelet therapy as per neurology recommendations     LOS: 1 Shirl Ludington 12/11/202011:35 AM    Note: This note was prepared with Dragon dictation. Any transcription errors are unintentional

## 2019-05-05 NOTE — Progress Notes (Signed)
Post HD Assessment   05/05/19 1559  Neurological  Level of Consciousness Alert  Orientation Level Oriented X4  Respiratory  Respiratory Pattern Regular  Bilateral Breath Sounds Diminished  Cardiac  Pulse Regular  Heart Sounds S1, S2  Cardiac Rhythm NSR  Vascular  R Radial Pulse +2  L Radial Pulse +2  Integumentary  Integumentary (WDL) X  Musculoskeletal  Musculoskeletal (WDL) X  GU Assessment  Genitourinary (WDL) X  Psychosocial  Psychosocial (WDL) WDL

## 2019-05-05 NOTE — Progress Notes (Signed)
PROGRESS NOTE                                                                                                                                                                                                             Patient Demographics:    Joseph Mcpherson, is a 73 y.o. male, DOB - 04-04-1946, XNA:[id number]  Admit date - 05/04/2019   Admitting Physician Athena Masse, MD  Outpatient Primary MD for the patient is [name], MD  LOS - 1  Outpatient Specialists:  Dr. Jennings Books (neurology) Dr. Posey Pronto (renal)  Chief Complaint  Patient presents with  . 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  He was sent to the ED.  Patient denies any headache, dizziness, nausea, vomiting, weakness or numbness of his extremities, slurred speech or word finding difficulty.  Denies any chest pain, palpitations, fevers, chills, bowel or urinary symptoms.  He reports that he was fine until 2 days back. In the ED blood pressure was elevated 177/64 mmHg.  Patient given full dose aspirin.  MRI of the brain showed acute infarct of the medial longitudinal fasciculus region of the dorsal pons on the left. Telemetry neurologist was consulted by ED physician.  Patient was already out of therapeutic window for TPA and was not given.  Patient admitted to hospital service.    Subjective:   Patient denies double vision but reports impaired vision when looking on the right.   Assessment  & Plan :    Principal Problem:   Acute CVA (cerebrovascular accident) ([place]) Left medial longitudinal fasciculus stroke which contributes to his impaired  right lateral vision and double vision.  Likely contributed by small vessel disease secondary to hypertension and diabetes mellitus. Patient on baby aspirin at home, increased to full dose aspirin.  LDL of 55.  Continue statin. Seen by PT who recommends home health. Patient and family instructed that he cannot drive until vision has improved and cleared by ophthalmologist or his neurologist as outpatient. Check carotid Doppler and 2D echo.  Active Problems:   Insulin dependent type 2 diabetes mellitus (HCC) CBG stable.  Continue Levemir and sliding scale coverage.  ESRD on hemodialysis Getting scheduled dialysis today.  Essential hypertension Resume home meds, allow permissive blood pressure.  Hypothyroidism Continue Synthroid  Neurocognitive deficit Continue Aricept    Code Status : Full code  Family Communication  : Wife at bedside.  Updated son on the phone.  Disposition Plan  : Home pending work-up and clinical improvement.  Possibly tomorrow  Barriers For Discharge : Active symptoms  Consults  : Neurology  Procedures  : MRI brain, ultrasound carotid and 2D echo  DVT Prophylaxis  : Subcu heparin  Lab Results  Component Value Date   PLT 236 05/04/2019    Antibiotics    Anti-infectives (From admission, onward)   None        Objective:   Vitals:   05/05/19 1200 05/05/19 1201 05/05/19 1215 05/05/19 1230  BP: (!) 153/83 (!) 153/83 (!) 140/52 (!) 147/50  Pulse: (!) 56 (!) 56 (!) 53 (!) 56  Resp: 16 16 11 11   Temp: (!) 97.5 F (36.4 C) (!) 97.5 F (36.4 C)    TempSrc: Oral Oral    SpO2: 100% 100% 99% 100%  Weight: 68.5 kg     Height:        Wt Readings from Last 3 Encounters:  05/05/19 68.5 kg  04/27/19 68 kg  01/26/19 67.5 kg     Intake/Output Summary (Last 24 hours) at 05/05/2019 1411 Last data filed at 05/05/2019 1003 Gross per 24 hour  Intake --  Output 1 ml  Net -1 ml     Physical Exam  Gen: not in distress HEENT: moist mucosa,  supple neck Chest: clear b/l, no added sounds CVS: N S1&S2, no murmurs, rubs or gallop GI: soft, NT, ND, BS+ Musculoskeletal: warm, no edema, left upper extremity AV fistula CNS: Alert and oriented, impaired vision on the right upon lateral gauge, normal power tone and reflex in bilateral extremity, normal sensation,   Data Review:    CBC Recent Labs  Lab 05/04/19 1413 05/04/19 2135  WBC 14.2* 11.8*  HGB 11.5* 10.8*  HCT 35.7* 33.2*  PLT 228 236  MCV 96.0 95.1  MCH 30.9 30.9  MCHC 32.2 32.5  RDW 13.6 13.7  LYMPHSABS 2.3  --   MONOABS 1.0  --   EOSABS 0.2  --   BASOSABS 0.1  --     Chemistries  Recent Labs  Lab 05/04/19 1413 05/04/19 2135  NA 136  --   K 5.2*  --   CL 95*  --   CO2 26  --   GLUCOSE 190*  --   BUN 35*  --   CREATININE 7.42* 7.68*  CALCIUM 9.0  --    ------------------------------------------------------------------------------------------------------------------ Recent Labs    05/05/19 0634  CHOL 144  HDL 29*  LDLCALC 55  TRIG 301*  CHOLHDL 5.0    Lab Results  Component Value Date   HGBA1C 6.7 (H) 05/05/2019   ------------------------------------------------------------------------------------------------------------------ No results for input(s): TSH, T4TOTAL, T3FREE, THYROIDAB in the last 72 hours.  Invalid input(s): FREET3 ------------------------------------------------------------------------------------------------------------------ No results for input(s): VITAMINB12, FOLATE, FERRITIN, TIBC, IRON, RETICCTPCT in the last 72 hours.  Coagulation profile No results for input(s): INR, PROTIME in the last 168 hours.  No results for input(s): DDIMER in the last 72 hours.  Cardiac Enzymes No results for input(s): CKMB, TROPONINI, MYOGLOBIN in the last 168 hours.  Invalid input(s): CK ------------------------------------------------------------------------------------------------------------------ No results  found for:  BNP  Inpatient Medications  Scheduled Meds: . aspirin EC  325 mg Oral Daily  . Chlorhexidine Gluconate Cloth  6 each Topical Q0600  . cholecalciferol  5,000 Units Oral Daily  . docusate sodium  100 mg Oral BID  . donepezil  10 mg Oral QHS  . heparin  5,000 Units Subcutaneous Q8H  . hydrALAZINE  50 mg Oral TID  . insulin aspart  0-6 Units Subcutaneous Q4H  . insulin detemir  16 Units Subcutaneous QHS  . levothyroxine  137 mcg Oral Q0600  . losartan  25 mg Oral QPM  . metoprolol succinate  50 mg Oral Daily  . multivitamin  1 tablet Oral Daily  . pantoprazole  40 mg Oral Daily  . sevelamer carbonate  2.4 g Oral TID WC  . simvastatin  40 mg Oral q1800   Continuous Infusions: PRN Meds:.acetaminophen **OR** acetaminophen (TYLENOL) oral liquid 160 mg/5 mL **OR** acetaminophen, senna-docusate  Micro Results Recent Results (from the past 240 hour(s))  SARS CORONAVIRUS 2 (TAT 6-24 HRS) Nasopharyngeal Nasopharyngeal Swab     Status: None   Collection Time: 05/04/19  9:35 PM   Specimen: Nasopharyngeal Swab  Result Value Ref Range Status   SARS Coronavirus 2 NEGATIVE NEGATIVE Final    Comment: (NOTE) SARS-CoV-2 target nucleic acids are NOT DETECTED. The SARS-CoV-2 RNA is generally detectable in upper and lower respiratory specimens during the acute phase of infection. Negative results do not preclude SARS-CoV-2 infection, do not rule out co-infections with other pathogens, and should not be used as the sole basis for treatment or other patient management decisions. Negative results must be combined with clinical observations, patient history, and epidemiological information. The expected result is Negative. Fact Sheet for Patients: SugarRoll.be Fact Sheet for Healthcare Providers: https://www.woods-mathews.com/ This test is not yet approved or cleared by the [place] FDA and  has been authorized for detection and/or diagnosis of  SARS-CoV-2 by FDA under an Emergency Use Authorization (EUA). This EUA will remain  in effect (meaning this test can be used) for the duration of the COVID-19 declaration under Section 56 4(b)(1) of the Act, 21 U.S.C. section 360bbb-3(b)(1), unless the authorization is terminated or revoked sooner. Performed at [name], [place] [address], [place], [place] [address]   MRSA PCR Screening     Status: None   Collection Time: 05/05/19 12:41 AM   Specimen: Nasopharyngeal  Result Value Ref Range Status   MRSA by PCR NEGATIVE NEGATIVE Final    Comment:        The GeneXpert MRSA Assay (FDA approved for NASAL specimens only), is one component of a comprehensive MRSA colonization surveillance program. It is not intended to diagnose MRSA infection nor to guide or monitor treatment for MRSA infections. Performed at [facility], [address] [place], [place] [address]     Radiology Reports DG Chest 2 View  Result Date: 05/04/2019 CLINICAL DATA:  Dizziness. EXAM: CHEST - 2 VIEW COMPARISON:  01/21/2018 FINDINGS: There is a well-positioned tunneled dialysis catheter on the right. The heart size is stable from prior study. There is no pneumothorax or significant pleural effusion. No large focal infiltrate. There is no acute osseous abnormality. IMPRESSION: No acute cardiopulmonary process. Electronically Signed   By: Constance Holster M.D.   On: 05/04/2019 20:05   MR Brain Wo Contrast (neuro protocol)  Result Date: 05/04/2019 CLINICAL DATA:  Double vision EXAM: MRI HEAD AND ORBITS WITHOUT CONTRAST TECHNIQUE: Multiplanar, multiecho pulse sequences of the  brain and surrounding structures were obtained without intravenous contrast. Multiplanar, multiecho pulse sequences of the orbits and surrounding structures were obtained including fat saturation techniques, without intravenous contrast administration. COMPARISON:  09/12/2013 FINDINGS: MRI HEAD FINDINGS Brain: Acute punctate  infarction in the medial longitudinal fasciculus region of the dorsal pons on the left could certainly explain double vision. No other acute brain infarction. Old small 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 Mild chronic small-vessel ischemic changes affect the cerebral hemispheric white matter elsewhere. No other large vessel territory infarction. No mass lesion, hemorrhage, hydrocephalus or extra-axial collection. Vascular: Major vessels at the base of the brain show flow. Skull and upper cervical spine: Negative Other: Retention cysts of the maxillary sinuses. MRI ORBITS FINDINGS Orbits: Both globes appear normal. Previous lens implants. Optic nerves are normal. Extra-ocular muscles are normal. Orbital fat is normal. Lacrimal glands are normal. Visualized sinuses: Retention cysts as noted above. Soft tissues: Otherwise negative Limited intracranial: See results of brain study above. IMPRESSION: Acute infarction of the medial longitudinal fasciculus region of the dorsal pons on the left which could certainly explain double vision. 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 Mild chronic small-vessel change elsewhere affecting the brain as described above. No primary orbital pathology seen. Electronically Signed   By: Nelson Chimes M.D.   On: 05/04/2019 17:12   US Carotid Bilateral (at [facility] and [facility] only)  Result Date: 05/05/2019 CLINICAL DATA:  74 year old male with a stroke EXAM: BILATERAL CAROTID DUPLEX ULTRASOUND TECHNIQUE: Pearline Cables scale imaging, color Doppler and duplex ultrasound were performed of bilateral carotid and vertebral arteries in the neck. COMPARISON:  09/12/2013 FINDINGS: Criteria: Quantification of carotid stenosis is based on velocity parameters that correlate the residual internal carotid diameter with NASCET-based stenosis levels, using the diameter of the distal internal carotid lumen as the denominator for stenosis  measurement. The following velocity measurements were obtained: RIGHT ICA:  Systolic 782 cm/sec, Diastolic 23 cm/sec CCA:  97 cm/sec SYSTOLIC ICA/CCA RATIO:  1.0 ECA:  152 cm/sec LEFT ICA:  Systolic 77 cm/sec, Diastolic 18 cm/sec CCA:  87 cm/sec SYSTOLIC ICA/CCA RATIO:  0.9 ECA:  137 cm/sec Right Brachial SBP: Not acquired Left Brachial SBP: Not acquired RIGHT CAROTID ARTERY: No significant calcifications of the right common carotid artery. Intermediate waveform maintained. Heterogeneous and partially calcified plaque at the right carotid bifurcation. No significant lumen shadowing. Low resistance waveform of the right ICA. Mild tortuosity RIGHT VERTEBRAL ARTERY: Antegrade flow with low resistance waveform. LEFT CAROTID ARTERY: No significant calcifications of the left common carotid artery. Intermediate waveform maintained. Heterogeneous and partially calcified plaque at the left carotid bifurcation without significant lumen shadowing. Low resistance waveform of the left ICA. Mild tortuosity LEFT VERTEBRAL ARTERY:  Antegrade flow with low resistance waveform. IMPRESSION: Color duplex indicates minimal heterogeneous and calcified plaque, with no hemodynamically significant stenosis by duplex criteria in the extracranial cerebrovascular circulation. Signed, Dulcy Fanny. Dellia Nims, RPVI [facility] Radiology Electronically Signed   By: Corrie Mckusick D.O.   On: 05/05/2019 09:09   ECHOCARDIOGRAM COMPLETE  Result Date: 05/05/2019   ECHOCARDIOGRAM REPORT   Patient Name:   ASAAD GULLEY Date of Exam: 05/05/2019 Medical Rec #:  [id number]   Height:       64.0 in Accession #:    [id number]  Weight:       154.0 lb Date of Birth:  05-Oct-1945    BSA:  1.75 m Patient Age:    23 years    BP:           127/52 mmHg Patient Gender: M           HR:           57 bpm. Exam Location:  [facility] Procedure: 2D Echo, Cardiac Doppler and Color Doppler Indications:     Stroke 434.91  History:          Patient has no prior history of Echocardiogram examinations.                  Risk Factors:Hypertension and Diabetes.  Sonographer:     Sherrie Sport RDCS (AE) Referring Phys:  [id number] Athena Masse Diagnosing Phys: Bartholome Bill MD IMPRESSIONS  1. Left ventricular ejection fraction, by visual estimation, is 60 to 65%. The left ventricle has normal function. Left ventricular septal wall thickness was mildly increased. Mildly increased left ventricular posterior wall thickness. There is borderline left ventricular hypertrophy.  2. Global right ventricle has normal systolic function.The right ventricular size is mildly enlarged. No increase in right ventricular wall thickness.  3. Left atrial size was mildly dilated.  4. Right atrial size was mildly dilated.  5. The mitral valve is grossly normal. Trivial mitral valve regurgitation.  6. The tricuspid valve is grossly normal. Tricuspid valve regurgitation is mild.  7. The aortic valve was not well visualized. Aortic valve regurgitation is not visualized.  8. The pulmonic valve was not well visualized. Pulmonic valve regurgitation is trivial.  9. Mildly elevated pulmonary artery systolic pressure. 10. The atrial septum is grossly normal. FINDINGS  Left Ventricle: Left ventricular ejection fraction, by visual estimation, is 60 to 65%. The left ventricle has normal function. The left ventricle is not well visualized. Mildly increased left ventricular posterior wall thickness. There is borderline left ventricular hypertrophy. Left ventricular diastolic parameters were normal. Right Ventricle: The right ventricular size is mildly enlarged. No increase in right ventricular wall thickness. Global RV systolic function is has normal systolic function. The tricuspid regurgitant velocity is 2.36 m/s, and with an assumed right atrial  pressure of 10 mmHg, the estimated right ventricular systolic pressure is mildly elevated at 32.3 mmHg. Left Atrium: Left atrial size was  mildly dilated. Right Atrium: Right atrial size was mildly dilated Pericardium: There is no evidence of pericardial effusion. Mitral Valve: The mitral valve is grossly normal. Trivial mitral valve regurgitation. Tricuspid Valve: The tricuspid valve is grossly normal. Tricuspid valve regurgitation is mild. Aortic Valve: The aortic valve was not well visualized. Aortic valve regurgitation is not visualized. Aortic valve mean gradient measures 4.0 mmHg. Aortic valve peak gradient measures 6.9 mmHg. Aortic valve area, by VTI measures 2.77 cm. Pulmonic Valve: The pulmonic valve was not well visualized. Pulmonic valve regurgitation is trivial. Pulmonic regurgitation is trivial. Aorta: The aortic root is normal in size and structure. IAS/Shunts: The atrial septum is grossly normal.  LEFT VENTRICLE PLAX 2D LVIDd:         4.21 cm  Diastology LVIDs:         2.41 cm  LV e' lateral:   6.74 cm/s LV PW:         1.18 cm  LV E/e' lateral: 15.0 LV IVS:        1.06 cm  LV e' medial:    5.22 cm/s LVOT diam:     2.00 cm  LV E/e' medial:  19.3 LV SV:  59 ml LV SV Index:   32.73 LVOT Area:     3.14 cm  RIGHT VENTRICLE RV Basal diam:  2.65 cm RV S prime:     10.10 cm/s TAPSE (M-mode): 3.2 cm LEFT ATRIUM             Index       RIGHT ATRIUM          Index LA diam:        3.80 cm 2.17 cm/m  RA Area:     7.76 cm LA Vol (A2C):   41.0 ml 23.42 ml/m RA Volume:   11.70 ml 6.68 ml/m LA Vol (A4C):   47.1 ml 26.90 ml/m LA Biplane Vol: 45.6 ml 26.05 ml/m  AORTIC VALVE                   PULMONIC VALVE AV Area (Vmax):    2.35 cm    PV Vmax:        1.04 m/s AV Area (Vmean):   2.32 cm    PV Peak grad:   4.3 mmHg AV Area (VTI):     2.77 cm    RVOT Peak grad: 4 mmHg AV Vmax:           131.50 cm/s AV Vmean:          91.450 cm/s AV VTI:            0.306 m AV Peak Grad:      6.9 mmHg AV Mean Grad:      4.0 mmHg LVOT Vmax:         98.30 cm/s LVOT Vmean:        67.400 cm/s LVOT VTI:          0.269 m LVOT/AV VTI ratio: 0.88  AORTA Ao Root  diam: 2.60 cm MITRAL VALVE                         TRICUSPID VALVE MV Area (PHT): 3.60 cm              TR Peak grad:   22.3 mmHg MV PHT:        61.19 msec            TR Vmax:        236.00 cm/s MV Decel Time: 211 msec MV E velocity: 101.00 cm/s 103 cm/s  SHUNTS MV A velocity: 94.10 cm/s  70.3 cm/s Systemic VTI:  0.27 m MV E/A ratio:  1.07        1.5       Systemic Diam: 2.00 cm  Bartholome Bill MD Electronically signed by Bartholome Bill MD Signature Date/Time: 05/05/2019/11:14:35 AM    Final    MR ORBITS WO CONTRAST  Result Date: 05/04/2019 CLINICAL DATA:  Double vision EXAM: MRI HEAD AND ORBITS WITHOUT CONTRAST TECHNIQUE: Multiplanar, multiecho pulse sequences of the brain and surrounding structures were obtained without intravenous contrast. Multiplanar, multiecho pulse sequences of the orbits and surrounding structures were obtained including fat saturation techniques, without intravenous contrast administration. COMPARISON:  09/12/2013 FINDINGS: MRI HEAD FINDINGS Brain: Acute punctate infarction in the medial longitudinal fasciculus region of the dorsal pons on the left could certainly explain double vision. No other acute brain infarction. Old small 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 Mild chronic small-vessel ischemic changes affect the cerebral hemispheric white matter elsewhere. No other large vessel territory  infarction. No mass lesion, hemorrhage, hydrocephalus or extra-axial collection. Vascular: Major vessels at the base of the brain show flow. Skull and upper cervical spine: Negative Other: Retention cysts of the maxillary sinuses. MRI ORBITS FINDINGS Orbits: Both globes appear normal. Previous lens implants. Optic nerves are normal. Extra-ocular muscles are normal. Orbital fat is normal. Lacrimal glands are normal. Visualized sinuses: Retention cysts as noted above. Soft tissues: Otherwise negative Limited  intracranial: See results of brain study above. IMPRESSION: Acute infarction of the medial longitudinal fasciculus region of the dorsal pons on the left which could certainly explain double vision. 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 Mild chronic small-vessel change elsewhere affecting the brain as described above. No primary orbital pathology seen. Electronically Signed   By: Nelson Chimes M.D.   On: 05/04/2019 17:12    Time Spent in minutes 35   Rodarius Kichline M.D on 05/05/2019 at 2:11 PM  Between 7am to 7pm - Pager - [phone]  After 7pm go to [email or web address] - password [facility]  [facility] -  Office  [phone]

## 2019-05-05 NOTE — Progress Notes (Signed)
Pre HD Assessment   05/05/19 1159  Neurological  Level of Consciousness Alert  Orientation Level Oriented X4  Respiratory  Respiratory Pattern Regular  Bilateral Breath Sounds Diminished  Cardiac  Pulse Regular  Heart Sounds S1, S2  Cardiac Rhythm NSR  Vascular  R Radial Pulse +2  L Radial Pulse +2  Integumentary  Integumentary (WDL) X  Musculoskeletal  Musculoskeletal (WDL) X  GU Assessment  Genitourinary (WDL) X  Psychosocial  Psychosocial (WDL) WDL

## 2019-05-05 NOTE — Plan of Care (Signed)
  Problem: Education: Goal: Knowledge of disease or condition will improve Outcome: Progressing Goal: Knowledge of secondary prevention will improve Outcome: Progressing Goal: Knowledge of patient specific risk factors addressed and post discharge goals established will improve Outcome: Progressing   Problem: Self-Care: Goal: Ability to participate in self-care as condition permits will improve Outcome: Progressing   Problem: Ischemic Stroke/TIA Tissue Perfusion: Goal: Complications of ischemic stroke/TIA will be minimized Outcome: Progressing   Problem: Education: Goal: Knowledge of General Education information will improve Description: Including pain rating scale, medication(s)/side effects and non-pharmacologic comfort measures Outcome: Progressing   Problem: Nutrition: Goal: Adequate nutrition will be maintained Outcome: Progressing   Problem: Pain Managment: Goal: General experience of comfort will improve Outcome: Progressing   Problem: Safety: Goal: Ability to remain free from injury will improve Outcome: Progressing   Problem: Skin Integrity: Goal: Risk for impaired skin integrity will decrease Outcome: Progressing

## 2019-05-05 NOTE — Progress Notes (Signed)
Hemodialysis patient known at [facility] MWF 11:20, patient self transports. Please contact me with any dialysis placement concerns.  Elvera Bicker Dialysis Coordinator [phone]

## 2019-05-05 NOTE — Progress Notes (Signed)
OT Cancellation Note  Patient Details Name: Joseph Mcpherson MRN: [id number] DOB: 1946/01/11   Cancelled Treatment:    Reason Eval/Treat Not Completed: Patient at procedure or test/ unavailable. Thank you for the OT consult. Order received and chart reviewed. Pt noted to be off the floor for hemodialysis. Will hold OT evaluation at this time and re-attempt at a later time/date as available and pt medically appropriate for OT evaluation.   Shara Blazing, M.S., OTR/L Ascom: [phone] 05/05/19, 1:30 PM

## 2019-05-05 NOTE — Progress Notes (Deleted)
Pre HD  

## 2019-05-05 NOTE — Evaluation (Addendum)
Physical Therapy Evaluation Patient Details Name: Joseph Mcpherson MRN: [id number] DOB: 07-09-1945 Today's Date: 05/05/2019   History of Present Illness  From Neuro MD note: Pt is a 73 y.o. 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  Pt states vision is worse when looking to the right side.  Pt has as stroke on L side MLF.    Clinical Impression  Of note pt's Ka 5.2 with MD giving verbal order for pt OK to participate with PT without restrictions.  Pt presented with minor deficits in strength, transfers, mobility, gait, and balance, and with moderate deficits in activity tolerance. Pt reported that double vision had resolved with no other stroke-like symptoms noted during the evaluation.  Pt reported that he has had gradually progressing difficulty with ambulation community distances secondary to poor activity tolerance/fatigue and would like to be able to walk farther before needing to rest.  Pt was only able to amb a maximum of 150' this session before requiring to return to sitting with SpO2 and HR WNL. Pt also declined to attempt ascending/descending stairs this session secondary to general fatigue.   Pt will benefit from HHPT services upon discharge to safely address above deficits for decreased caregiver assistance and eventual return to PLOF.      Follow Up Recommendations Home health PT;Supervision - Intermittent    Equipment Recommendations  None recommended by PT    Recommendations for Other Services       Precautions / Restrictions Precautions Precautions: Fall Restrictions Weight Bearing Restrictions: No      Mobility  Bed Mobility Overal bed mobility: Modified Independent             General bed mobility comments: Extra time and effort and use of  bed rail required  Transfers Overall transfer level: Needs assistance Equipment used: None Transfers: Sit to/from Stand Sit to Stand: Supervision         General transfer comment: Good eccentric and concentric control  Ambulation/Gait Ambulation/Gait assistance: Supervision Gait Distance (Feet): 150 Feet Assistive device: None Gait Pattern/deviations: Step-through pattern;Decreased step length - right;Decreased step length - left;Drifts right/left Gait velocity: decreased   General Gait Details: Decreased cadence and bilateral step length with minor drifting left/right but pt able to self-correct  Stairs Stairs: (pt declined to attempt secondary to fatigue)          Wheelchair Mobility    Modified Rankin (Stroke Patients Only)       Balance Overall balance assessment: Mild deficits observed, not formally tested                                           Pertinent Vitals/Pain Pain Assessment: No/denies pain    Home Living Family/patient expects to be discharged to:: Private residence Living Arrangements: Spouse/significant other Available Help at Discharge: Family;Available 24 hours/day Type of Home: House Home Access: Stairs to enter Entrance Stairs-Rails: Right;Can reach both;Left Entrance Stairs-Number of Steps: 4 Home Layout: Two level;Able to live on main level with bedroom/bathroom Home Equipment: Grab bars - toilet;Grab bars - tub/shower      Prior Function Level of Independence: Independent         Comments: Ind amb limited community distances without an AD, no fall history,  Ind with ADLs     Hand Dominance   Dominant Hand: Right    Extremity/Trunk Assessment   Upper Extremity Assessment Upper Extremity Assessment: RUE deficits/detail;LUE deficits/detail RUE Deficits / Details: Elbow flex/ext 4+/5, grip strength WNL, finger to nose WNL RUE Sensation: WNL RUE Coordination: WNL LUE Deficits / Details: Elbow flex/ext  4+/5, grip strength WNL, finger to nose WNL LUE Sensation: WNL LUE Coordination: WNL    Lower Extremity Assessment Lower Extremity Assessment: Generalized weakness;RLE deficits/detail;LLE deficits/detail RLE Deficits / Details: RLE strength grossly 4/5 and equal left/right, no ataxia noted, light touch and proprioception grossly intact RLE Sensation: WNL RLE Coordination: WNL LLE Deficits / Details: LLE strength grossly 4/5 and equal left/right, no ataxia noted, light touch and proprioception grossly intact LLE Sensation: WNL LLE Coordination: WNL       Communication   Communication: No difficulties  Cognition Arousal/Alertness: Awake/alert Behavior During Therapy: WFL for tasks assessed/performed Overall Cognitive Status: Within Functional Limits for tasks assessed                                        General Comments      Exercises Total Joint Exercises Ankle Circles/Pumps: Strengthening;Both;10 reps Quad Sets: Strengthening;Both;10 reps Towel Squeeze: Strengthening;Both;10 reps Hip ABduction/ADduction: Strengthening;Both;10 reps Straight Leg Raises: Strengthening;Both;10 reps Long Arc Quad: Strengthening;Both;10 reps Knee Flexion: Strengthening;Both;10 reps Other Exercises Other Exercises: Static standing balance training with feet apart and together with head still and head turns with fair stability   Assessment/Plan    PT Assessment Patient needs continued PT services  PT Problem List Decreased strength;Decreased activity tolerance;Decreased balance;Decreased mobility       PT Treatment Interventions DME instruction;Gait training;Neuromuscular re-education;Stair training;Functional mobility training;Therapeutic activities;Therapeutic exercise;Balance training;Patient/family education    PT Goals (Current goals can be found in the Care Plan section)  Acute Rehab PT Goals Patient Stated Goal: To be able to walk far enough to travel PT Goal  Formulation: With patient Time For Goal Achievement: 05/18/19 Potential to Achieve Goals: Good    Frequency Min 2X/week   Barriers to discharge        Co-evaluation               AM-PAC PT "6 Clicks" Mobility  Outcome Measure Help needed turning from your back to your side while in a flat bed without using bedrails?: A Little Help needed moving from lying on your back to sitting on the side of a flat bed without using bedrails?: A Little Help needed moving to and from a bed to a chair (including a wheelchair)?: A Little Help needed standing up from a chair using your arms (e.g., wheelchair or bedside chair)?: A Little Help needed to walk in hospital room?: A Little Help needed climbing 3-5 steps with a railing? : A Little 6 Click Score: 18    End of Session Equipment Utilized During Treatment: Gait belt Activity Tolerance: Patient tolerated treatment well Patient left: with call bell/phone within reach;in chair;with chair alarm set Nurse Communication: Mobility status PT Visit Diagnosis: Unsteadiness on feet (R26.81);Difficulty in walking, not elsewhere classified (R26.2);Muscle weakness (generalized) (M62.81)    Time: 3710-6269 PT Time Calculation (min) (ACUTE ONLY): 30 min   Charges:   PT Evaluation $PT Eval Moderate Complexity: 1 Mod PT Treatments $Therapeutic Exercise: 8-22 mins        D. Scott Kweku Stankey PT, DPT 05/05/19, 11:30 AM

## 2019-05-06 DIAGNOSIS — E119 Type 2 diabetes mellitus without complications: Secondary | ICD-10-CM

## 2019-05-06 DIAGNOSIS — Z794 Long term (current) use of insulin: Secondary | ICD-10-CM

## 2019-05-06 DIAGNOSIS — Z992 Dependence on renal dialysis: Secondary | ICD-10-CM

## 2019-05-06 LAB — GLUCOSE, CAPILLARY
Glucose-Capillary: 125 mg/dL — ABNORMAL HIGH (ref 70–99)
Glucose-Capillary: 110 mg/dL — ABNORMAL HIGH (ref 70–99)
Glucose-Capillary: 135 mg/dL — ABNORMAL HIGH (ref 70–99)

## 2019-05-06 MED ORDER — ASPIRIN 325 MG PO TBEC: 325.0000 mg | DELAYED_RELEASE_TABLET | Freq: Every day | ORAL | 3 refills | Status: DC

## 2019-05-06 MED ORDER — POLYETHYLENE GLYCOL 3350 17 G PO PACK
17.0000 g | PACK | Freq: Every day | ORAL | Status: DC
  Administered 2019-05-06: 17 g via ORAL
  Filled 2019-05-06: qty 1

## 2019-05-06 MED ORDER — MAGNESIUM HYDROXIDE 400 MG/5ML PO SUSP: 30.0000 mL | Freq: Every day | ORAL | Status: DC | PRN

## 2019-05-06 NOTE — TOC Transition Note (Signed)
Transition of Care [facility]) - CM/SW Discharge Note   Patient Details  Name: Joseph Mcpherson MRN: [id number] Date of Birth: 08/25/1945  Transition of Care [facility]) CM/SW Contact:  Marshell Garfinkel, RN Phone Number: 05/06/2019, 11:36 AM   Clinical Narrative:     Spoke with wife by phone and all agree to discharge to home today followed by [facility]. Corene Cornea with [facility] updated. No further RNCM needs. He will ride in private vehicle with wife.   Final next level of care: Home w Home Health Services Barriers to Discharge: No Barriers Identified   Patient Goals and CMS Choice Patient states their goals for this hospitalization and ongoing recovery are:: Per Wife- home with home health CMS Medicare.gov Compare Post Acute Care list provided to:: Patient Represenative (must comment)    Discharge Placement                       Discharge Plan and Services                          HH Arranged: PT [place] Agency: Maish Vaya ([facility]) Date [place]: 05/06/19 Time [place]: [id number] Representative spoke with at [place]: [place] ([place]) Interventions     Readmission Risk Interventions No flowsheet data found.

## 2019-05-06 NOTE — Progress Notes (Signed)
Patient discharged home with Home Health. Prescription given to patient. All discharge instructions given and all questions answered.

## 2019-05-06 NOTE — Plan of Care (Signed)
Pt has reported that he needs to have bowel movement. MD is aware. Pt was given colace and senna during shift but still no bowel movement. Will continue to monitor.

## 2019-05-06 NOTE — Discharge Summary (Signed)
Physician Discharge Summary  Joseph Mcpherson BTD:[id number] DOB: [birth year] DOA: 05/04/2019  PCP: [name], MD  Admit date: 05/04/2019 Discharge date: 05/06/2019  Admitted From: Home Disposition: Home  Recommendations for Outpatient Follow-up:  1. Follow-up with neurologist in 2 weeks.  Patient being discharged on full dose aspirin. 2. He is instructed not to drive until he is cleared by his neurologist or outpatient ophthalmologist regarding his vision.  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  He was sent to the ED.  Patient denies any headache, dizziness, nausea, vomiting, weakness or numbness of his extremities, slurred speech or word finding difficulty.  Denies any chest pain, palpitations, fevers, chills, bowel or urinary symptoms.  He reports that he was fine until 2 days back. In the ED blood pressure was elevated 177/64 mmHg.  Patient given full dose aspirin.  MRI of the brain showed acute infarct of the medial longitudinal fasciculus region of the dorsal pons on the left.  Tele- neurologist was consulted by ED physician.  Patient was already out of therapeutic window for TPA and was not given.  Patient admitted to hospital service.  Hospital course  Principal Problem:   Acute  CVA (cerebrovascular accident) ([place]) Left medial longitudinal fasciculus stroke which contributes to his impaired right lateral vision and double vision.  Likely contributed by small vessel disease secondary to hypertension and diabetes mellitus. Patient on baby aspirin at home, increased to full dose aspirin.  LDL of 55.  Continue home dose statin.  2D echo with normal EF and no wall motion abnormality or source of cardiac emboli.  Carotid ultrasound without hemodynamically significant stenosis.  Seen by PT who recommends home health. Patient reports his vision to be better today and no diplopia.  Patient and family instructed that he cannot drive until vision has improved and cleared by ophthalmologist or his neurologist as outpatient.  I have messaged his neurologist Dr. Manuella Ghazi that he needs to be seen in the office in the next 2 weeks.   Active Problems:   Insulin dependent type 2 diabetes mellitus (HCC) BG stable.  Continue Levemir.  ESRD on hemodialysis Received scheduled dialysis on 12/11  Essential hypertension Blood pressure stable.  Home meds resumed.  Hypothyroidism Continue Synthroid  Neurocognitive deficit Continue Aricept      Family Communication  :  Updated wife and son  Disposition Plan  : Home    Discharge Instructions   Allergies as of 05/06/2019   No Known Allergies     Medication List    STOP taking these medications   HYDROcodone-acetaminophen 5-325 MG tablet Commonly known as: Norco     TAKE these medications   aspirin 325 MG EC tablet Take 1 tablet (325 mg total) by mouth daily. What changed:   medication strength  how much to take   docusate sodium 100 MG capsule Commonly known as: COLACE  Take 100 mg by mouth 2 (two) times daily.   donepezil 10 MG tablet Commonly known as: ARICEPT Take 10 mg by mouth at bedtime.   gabapentin 300 MG capsule Commonly known as: NEURONTIN Take 300 mg by mouth at bedtime.    hydrALAZINE 50 MG tablet Commonly known as: APRESOLINE Take 50 mg by mouth 3 (three) times daily.   Levemir 100 UNIT/ML injection Generic drug: insulin detemir Inject 16 Units into the skin at bedtime.   levothyroxine 137 MCG tablet Commonly known as: SYNTHROID Take 137 mcg by mouth daily before breakfast.   losartan 25 MG tablet Commonly known as: COZAAR Take 25 mg by mouth every evening.   metoprolol succinate 50 MG 24 hr tablet Commonly known as: TOPROL-XL Take 50 mg by mouth daily.   NovoLOG FlexPen 100 UNIT/ML FlexPen Generic drug: insulin aspart Inject 8 Units into the skin 2 (two) times daily.   omeprazole 40 MG capsule Commonly known as: PRILOSEC Take 40 mg by mouth daily.   Rena-Vite Rx 1 MG Tabs Take 1 tablet by mouth daily.   sevelamer carbonate 2.4 g Pack Commonly known as: RENVELA Take 2.4 g by mouth 3 (three) times daily.   simvastatin 20 MG tablet Commonly known as: ZOCOR Take 20 mg by mouth every evening.   sitaGLIPtin 50 MG tablet Commonly known as: JANUVIA Take 50 mg by mouth every evening.   Vitamin D3 125 MCG (5000 UT) Tabs Take 5,000 Units by mouth daily.      Follow-up Information    [name], MD. Schedule an appointment as soon as possible for a visit in 1 week(s).   Specialty: Internal Medicine Contact information: [place] [address] [phone]        Vladimir Crofts, MD. Schedule an appointment as soon as possible for a visit in 2 week(s).   Specialty: Neurology Contact information: [facility] [place] [address] [phone]          No Known Allergies      Procedures/Studies: DG Chest 2 View  Result Date: 05/04/2019 CLINICAL DATA:  Dizziness. EXAM: CHEST - 2 VIEW COMPARISON:  01/21/2018 FINDINGS: There is a well-positioned tunneled dialysis catheter on the right. The heart size is stable from prior study. There is no pneumothorax or  significant pleural effusion. No large focal infiltrate. There is no acute osseous abnormality. IMPRESSION: No acute cardiopulmonary process. Electronically Signed   By: Constance Holster M.D.   On: 05/04/2019 20:05   MR Brain Wo Contrast (neuro protocol)  Result Date: 05/04/2019 CLINICAL DATA:  Double vision EXAM: MRI HEAD AND ORBITS WITHOUT CONTRAST TECHNIQUE: Multiplanar, multiecho pulse sequences of the brain and surrounding structures were obtained without intravenous contrast. Multiplanar, multiecho pulse sequences of the orbits and surrounding structures were obtained including fat saturation techniques, without intravenous contrast administration. COMPARISON:  09/12/2013 FINDINGS: MRI HEAD FINDINGS Brain: Acute punctate infarction in the medial longitudinal fasciculus region of the dorsal pons on the left could certainly explain double vision. No other acute brain infarction. Old small 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 Mild chronic small-vessel ischemic changes affect the cerebral hemispheric white matter elsewhere. No other large vessel territory infarction. No mass lesion, hemorrhage, hydrocephalus or extra-axial collection. Vascular: Major vessels at the base of the brain show flow. Skull and upper cervical spine: Negative Other: Retention cysts of the maxillary sinuses. MRI ORBITS FINDINGS Orbits: Both  globes appear normal. Previous lens implants. Optic nerves are normal. Extra-ocular muscles are normal. Orbital fat is normal. Lacrimal glands are normal. Visualized sinuses: Retention cysts as noted above. Soft tissues: Otherwise negative Limited intracranial: See results of brain study above. IMPRESSION: Acute infarction of the medial longitudinal fasciculus region of the dorsal pons on the left which could certainly explain double vision. 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 Mild chronic small-vessel change  elsewhere affecting the brain as described above. No primary orbital pathology seen. Electronically Signed   By: Nelson Chimes M.D.   On: 05/04/2019 17:12   US Carotid Bilateral (at [facility] and [facility] only)  Result Date: 05/05/2019 CLINICAL DATA:  73 year old male with a stroke EXAM: BILATERAL CAROTID DUPLEX ULTRASOUND TECHNIQUE: Pearline Cables scale imaging, color Doppler and duplex ultrasound were performed of bilateral carotid and vertebral arteries in the neck. COMPARISON:  09/12/2013 FINDINGS: Criteria: Quantification of carotid stenosis is based on velocity parameters that correlate the residual internal carotid diameter with NASCET-based stenosis levels, using the diameter of the distal internal carotid lumen as the denominator for stenosis measurement. The following velocity measurements were obtained: RIGHT ICA:  Systolic 008 cm/sec, Diastolic 23 cm/sec CCA:  97 cm/sec SYSTOLIC ICA/CCA RATIO:  1.0 ECA:  152 cm/sec LEFT ICA:  Systolic 77 cm/sec, Diastolic 18 cm/sec CCA:  87 cm/sec SYSTOLIC ICA/CCA RATIO:  0.9 ECA:  137 cm/sec Right Brachial SBP: Not acquired Left Brachial SBP: Not acquired RIGHT CAROTID ARTERY: No significant calcifications of the right common carotid artery. Intermediate waveform maintained. Heterogeneous and partially calcified plaque at the right carotid bifurcation. No significant lumen shadowing. Low resistance waveform of the right ICA. Mild tortuosity RIGHT VERTEBRAL ARTERY: Antegrade flow with low resistance waveform. LEFT CAROTID ARTERY: No significant calcifications of the left common carotid artery. Intermediate waveform maintained. Heterogeneous and partially calcified plaque at the left carotid bifurcation without significant lumen shadowing. Low resistance waveform of the left ICA. Mild tortuosity LEFT VERTEBRAL ARTERY:  Antegrade flow with low resistance waveform. IMPRESSION: Color duplex indicates minimal heterogeneous and calcified plaque, with no hemodynamically significant stenosis by  duplex criteria in the extracranial cerebrovascular circulation. Signed, Dulcy Fanny. Dellia Nims, RPVI [facility] Electronically Signed   By: Corrie Mckusick D.O.   On: 05/05/2019 09:09   ECHOCARDIOGRAM COMPLETE  Result Date: 05/05/2019   ECHOCARDIOGRAM REPORT   Patient Name:   Joseph Mcpherson Date of Exam: 05/05/2019 Medical Rec #:  [id number]   Height:       64.0 in Accession #:    [id number]  Weight:       154.0 lb Date of Birth:  1945-09-19    BSA:          1.75 m Patient Age:    61 years    BP:           127/52 mmHg Patient Gender: M           HR:           57 bpm. Exam Location:  [facility] Procedure: 2D Echo, Cardiac Doppler and Color Doppler Indications:     Stroke 434.91  History:         Patient has no prior history of Echocardiogram examinations.                  Risk Factors:Hypertension and Diabetes.  Sonographer:     Sherrie Sport RDCS (AE) Referring Phys:  [id number] Athena Masse Diagnosing Phys: Bartholome Bill MD IMPRESSIONS  1. Left ventricular  ejection fraction, by visual estimation, is 60 to 65%. The left ventricle has normal function. Left ventricular septal wall thickness was mildly increased. Mildly increased left ventricular posterior wall thickness. There is borderline left ventricular hypertrophy.  2. Global right ventricle has normal systolic function.The right ventricular size is mildly enlarged. No increase in right ventricular wall thickness.  3. Left atrial size was mildly dilated.  4. Right atrial size was mildly dilated.  5. The mitral valve is grossly normal. Trivial mitral valve regurgitation.  6. The tricuspid valve is grossly normal. Tricuspid valve regurgitation is mild.  7. The aortic valve was not well visualized. Aortic valve regurgitation is not visualized.  8. The pulmonic valve was not well visualized. Pulmonic valve regurgitation is trivial.  9. Mildly elevated pulmonary artery systolic pressure. 10. The atrial septum is grossly  normal. FINDINGS  Left Ventricle: Left ventricular ejection fraction, by visual estimation, is 60 to 65%. The left ventricle has normal function. The left ventricle is not well visualized. Mildly increased left ventricular posterior wall thickness. There is borderline left ventricular hypertrophy. Left ventricular diastolic parameters were normal. Right Ventricle: The right ventricular size is mildly enlarged. No increase in right ventricular wall thickness. Global RV systolic function is has normal systolic function. The tricuspid regurgitant velocity is 2.36 m/s, and with an assumed right atrial  pressure of 10 mmHg, the estimated right ventricular systolic pressure is mildly elevated at 32.3 mmHg. Left Atrium: Left atrial size was mildly dilated. Right Atrium: Right atrial size was mildly dilated Pericardium: There is no evidence of pericardial effusion. Mitral Valve: The mitral valve is grossly normal. Trivial mitral valve regurgitation. Tricuspid Valve: The tricuspid valve is grossly normal. Tricuspid valve regurgitation is mild. Aortic Valve: The aortic valve was not well visualized. Aortic valve regurgitation is not visualized. Aortic valve mean gradient measures 4.0 mmHg. Aortic valve peak gradient measures 6.9 mmHg. Aortic valve area, by VTI measures 2.77 cm. Pulmonic Valve: The pulmonic valve was not well visualized. Pulmonic valve regurgitation is trivial. Pulmonic regurgitation is trivial. Aorta: The aortic root is normal in size and structure. IAS/Shunts: The atrial septum is grossly normal.  LEFT VENTRICLE PLAX 2D LVIDd:         4.21 cm  Diastology LVIDs:         2.41 cm  LV e' lateral:   6.74 cm/s LV PW:         1.18 cm  LV E/e' lateral: 15.0 LV IVS:        1.06 cm  LV e' medial:    5.22 cm/s LVOT diam:     2.00 cm  LV E/e' medial:  19.3 LV SV:         59 ml LV SV Index:   32.73 LVOT Area:     3.14 cm  RIGHT VENTRICLE RV Basal diam:  2.65 cm RV S prime:     10.10 cm/s TAPSE (M-mode): 3.2 cm LEFT  ATRIUM             Index       RIGHT ATRIUM          Index LA diam:        3.80 cm 2.17 cm/m  RA Area:     7.76 cm LA Vol (A2C):   41.0 ml 23.42 ml/m RA Volume:   11.70 ml 6.68 ml/m LA Vol (A4C):   47.1 ml 26.90 ml/m LA Biplane Vol: 45.6 ml 26.05 ml/m  AORTIC VALVE  PULMONIC VALVE AV Area (Vmax):    2.35 cm    PV Vmax:        1.04 m/s AV Area (Vmean):   2.32 cm    PV Peak grad:   4.3 mmHg AV Area (VTI):     2.77 cm    RVOT Peak grad: 4 mmHg AV Vmax:           131.50 cm/s AV Vmean:          91.450 cm/s AV VTI:            0.306 m AV Peak Grad:      6.9 mmHg AV Mean Grad:      4.0 mmHg LVOT Vmax:         98.30 cm/s LVOT Vmean:        67.400 cm/s LVOT VTI:          0.269 m LVOT/AV VTI ratio: 0.88  AORTA Ao Root diam: 2.60 cm MITRAL VALVE                         TRICUSPID VALVE MV Area (PHT): 3.60 cm              TR Peak grad:   22.3 mmHg MV PHT:        61.19 msec            TR Vmax:        236.00 cm/s MV Decel Time: 211 msec MV E velocity: 101.00 cm/s 103 cm/s  SHUNTS MV A velocity: 94.10 cm/s  70.3 cm/s Systemic VTI:  0.27 m MV E/A ratio:  1.07        1.5       Systemic Diam: 2.00 cm  Bartholome Bill MD Electronically signed by Bartholome Bill MD Signature Date/Time: 05/05/2019/11:14:35 AM    Final    MR ORBITS WO CONTRAST  Result Date: 05/04/2019 CLINICAL DATA:  Double vision EXAM: MRI HEAD AND ORBITS WITHOUT CONTRAST TECHNIQUE: Multiplanar, multiecho pulse sequences of the brain and surrounding structures were obtained without intravenous contrast. Multiplanar, multiecho pulse sequences of the orbits and surrounding structures were obtained including fat saturation techniques, without intravenous contrast administration. COMPARISON:  09/12/2013 FINDINGS: MRI HEAD FINDINGS Brain: Acute punctate infarction in the medial longitudinal fasciculus region of the dorsal pons on the left could certainly explain double vision. No other acute brain infarction. Old small vessel infarction in the right  thalamus. 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 Mild chronic small-vessel ischemic changes affect the cerebral hemispheric white matter elsewhere. No other large vessel territory infarction. No mass lesion, hemorrhage, hydrocephalus or extra-axial collection. Vascular: Major vessels at the base of the brain show flow. Skull and upper cervical spine: Negative Other: Retention cysts of the maxillary sinuses. MRI ORBITS FINDINGS Orbits: Both globes appear normal. Previous lens implants. Optic nerves are normal. Extra-ocular muscles are normal. Orbital fat is normal. Lacrimal glands are normal. Visualized sinuses: Retention cysts as noted above. Soft tissues: Otherwise negative Limited intracranial: See results of brain study above. IMPRESSION: Acute infarction of the medial longitudinal fasciculus region of the dorsal pons on the left which could certainly explain double vision. 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 Mild chronic small-vessel change elsewhere affecting the brain as described above. No primary orbital pathology seen. Electronically Signed   By: Nelson Chimes M.D.   On: 05/04/2019 17:12       Subjective: Reports some impaired right lateral  field of vision.  Denies diplopia.  Discharge Exam: Vitals:   05/06/19 0457 05/06/19 0744  BP: (!) 130/53 (!) 146/63  Pulse: 63 (!) 59  Resp: 20 18  Temp: 98.1 F (36.7 C) 98.8 F (37.1 C)  SpO2: 100% 100%   Vitals:   05/05/19 2114 05/06/19 0020 05/06/19 0457 05/06/19 0744  BP: (!) 139/50 (!) 147/52 (!) 130/53 (!) 146/63  Pulse: 62 61 63 (!) 59  Resp: 16 16 20 18   Temp: 98.6 F (37 C) 98.4 F (36.9 C) 98.1 F (36.7 C) 98.8 F (37.1 C)  TempSrc: Oral Oral Oral Oral  SpO2: 99% 100% 100% 100%  Weight:      Height:        General: Elderly male not in distress HEENT: Moist mucosa, supple neck Chest: Clear CVs: Normal S1-S2 GI: Soft, nondistended, nontender Musculoskeletal: Warm, no edema, left  AV fistula CNS: Alert and oriented, impaired right peripheral vision, nonfocal    The results of significant diagnostics from this hospitalization (including imaging, microbiology, ancillary and laboratory) are listed below for reference.     Microbiology: Recent Results (from the past 240 hour(s))  SARS CORONAVIRUS 2 (TAT 6-24 HRS) Nasopharyngeal Nasopharyngeal Swab     Status: None   Collection Time: 05/04/19  9:35 PM   Specimen: Nasopharyngeal Swab  Result Value Ref Range Status   SARS Coronavirus 2 NEGATIVE NEGATIVE Final    Comment: (NOTE) SARS-CoV-2 target nucleic acids are NOT DETECTED. The SARS-CoV-2 RNA is generally detectable in upper and lower respiratory specimens during the acute phase of infection. Negative results do not preclude SARS-CoV-2 infection, do not rule out co-infections with other pathogens, and should not be used as the sole basis for treatment or other patient management decisions. Negative results must be combined with clinical observations, patient history, and epidemiological information. The expected result is Negative. Fact Sheet for Patients: SugarRoll.be Fact Sheet for Healthcare Providers: https://www.woods-mathews.com/ This test is not yet approved or cleared by the [place] FDA and  has been authorized for detection and/or diagnosis of SARS-CoV-2 by FDA under an Emergency Use Authorization (EUA). This EUA will remain  in effect (meaning this test can be used) for the duration of the COVID-19 declaration under Section 56 4(b)(1) of the Act, 21 U.S.C. section 360bbb-3(b)(1), unless the authorization is terminated or revoked sooner. Performed at [facility], [place] [address], [place], [place] [address]   MRSA PCR Screening     Status: None   Collection Time: 05/05/19 12:41 AM   Specimen: Nasopharyngeal  Result Value Ref Range Status   MRSA by PCR NEGATIVE NEGATIVE Final    Comment:         The GeneXpert MRSA Assay (FDA approved for NASAL specimens only), is one component of a comprehensive MRSA colonization surveillance program. It is not intended to diagnose MRSA infection nor to guide or monitor treatment for MRSA infections. Performed at [facility], [address] [place], [place] [address]      Labs: BNP (last 3 results) No results for input(s): BNP in the last 8760 hours. Basic Metabolic Panel: Recent Labs  Lab 05/04/19 1413 05/04/19 2135  NA 136  --   K 5.2*  --   CL 95*  --   CO2 26  --   GLUCOSE 190*  --   BUN 35*  --   CREATININE 7.42* 7.68*  CALCIUM 9.0  --    Liver Function Tests: No results for input(s): AST, ALT, ALKPHOS,  BILITOT, PROT, ALBUMIN in the last 168 hours. No results for input(s): LIPASE, AMYLASE in the last 168 hours. No results for input(s): AMMONIA in the last 168 hours. CBC: Recent Labs  Lab 05/04/19 1413 05/04/19 2135  WBC 14.2* 11.8*  NEUTROABS 10.5*  --   HGB 11.5* 10.8*  HCT 35.7* 33.2*  MCV 96.0 95.1  PLT 228 236   Cardiac Enzymes: No results for input(s): CKTOTAL, CKMB, CKMBINDEX, TROPONINI in the last 168 hours. BNP: Invalid input(s): POCBNP CBG: Recent Labs  Lab 05/05/19 1642 05/05/19 2037 05/05/19 2342 05/06/19 0409 05/06/19 0743  GLUCAP 151* 176* 145* 125* 110*   D-Dimer No results for input(s): DDIMER in the last 72 hours. Hgb A1c Recent Labs    05/05/19 0634  HGBA1C 6.7*   Lipid Profile Recent Labs    05/05/19 0634  CHOL 144  HDL 29*  LDLCALC 55  TRIG 301*  CHOLHDL 5.0   Thyroid function studies No results for input(s): TSH, T4TOTAL, T3FREE, THYROIDAB in the last 72 hours.  Invalid input(s): FREET3 Anemia work up No results for input(s): VITAMINB12, FOLATE, FERRITIN, TIBC, IRON, RETICCTPCT in the last 72 hours. Urinalysis    Component Value Date/Time   COLORURINE Straw 09/11/2013 1933   APPEARANCEUR Clear 09/11/2013 1933   LABSPEC 1.016 09/11/2013 1933    PHURINE 7.0 09/11/2013 1933   GLUCOSEU >=500 09/11/2013 1933   HGBUR Negative 09/11/2013 1933   BILIRUBINUR Negative 09/11/2013 1933   KETONESUR Negative 09/11/2013 1933   PROTEINUR 100 mg/dL 09/11/2013 1933   NITRITE Negative 09/11/2013 1933   LEUKOCYTESUR Negative 09/11/2013 1933   Sepsis Labs Invalid input(s): PROCALCITONIN,  WBC,  LACTICIDVEN Microbiology Recent Results (from the past 240 hour(s))  SARS CORONAVIRUS 2 (TAT 6-24 HRS) Nasopharyngeal Nasopharyngeal Swab     Status: None   Collection Time: 05/04/19  9:35 PM   Specimen: Nasopharyngeal Swab  Result Value Ref Range Status   SARS Coronavirus 2 NEGATIVE NEGATIVE Final    Comment: (NOTE) SARS-CoV-2 target nucleic acids are NOT DETECTED. The SARS-CoV-2 RNA is generally detectable in upper and lower respiratory specimens during the acute phase of infection. Negative results do not preclude SARS-CoV-2 infection, do not rule out co-infections with other pathogens, and should not be used as the sole basis for treatment or other patient management decisions. Negative results must be combined with clinical observations, patient history, and epidemiological information. The expected result is Negative. Fact Sheet for Patients: SugarRoll.be Fact Sheet for Healthcare Providers: https://www.woods-mathews.com/ This test is not yet approved or cleared by the [place] FDA and  has been authorized for detection and/or diagnosis of SARS-CoV-2 by FDA under an Emergency Use Authorization (EUA). This EUA will remain  in effect (meaning this test can be used) for the duration of the COVID-19 declaration under Section 56 4(b)(1) of the Act, 21 U.S.C. section 360bbb-3(b)(1), unless the authorization is terminated or revoked sooner. Performed at [facility], [place] [address], [place], [place] [address]   MRSA PCR Screening     Status: None   Collection Time: 05/05/19 12:41 AM    Specimen: Nasopharyngeal  Result Value Ref Range Status   MRSA by PCR NEGATIVE NEGATIVE Final    Comment:        The GeneXpert MRSA Assay (FDA approved for NASAL specimens only), is one component of a comprehensive MRSA colonization surveillance program. It is not intended to diagnose MRSA infection nor to guide or monitor treatment for MRSA infections. Performed at [facility], [phone]  [address], [place], [place] [address]      Time coordinating discharge: 35 minutes  SIGNED:   Louellen Molder, MD  [facility] 05/06/2019, 10:36 AM Pager   If 7PM-7AM, please contact night-coverage [email or web address] Password [email or web address]

## 2019-05-06 NOTE — Consult Note (Signed)
Subjective: No new findings.    Past Medical History:  Diagnosis Date  . Chronic kidney disease   . Clotting disorder ([place])    per daughter, "when patient travels, his feet swell"  . Diabetes mellitus without complication ([place])   . GERD (gastroesophageal reflux disease)   . Hyperlipidemia   . Hypertension   . Hypothyroidism   . Thyroid disease     Past Surgical History:  Procedure Laterality Date  . AV FISTULA PLACEMENT Right 10/22/2017   Procedure: ARTERIOVENOUS (AV) BRACHIOCEPHALIC FISTULA CREATION RIGHT UPPER ARM;  Surgeon: Conrad Sunnyvale, MD;  Location: [facility];  Service: Vascular;  Laterality: Right;  . AV FISTULA PLACEMENT Left 02/03/2019   Procedure: INSERTION OF ARTERIOVENOUS (AV) GORE-TEX GRAFT ARM (BRACHIAL AXILLARY );  Surgeon: [name], MD;  Location: [facility]S;  Service: Vascular;  Laterality: Left;    Family History  Problem Relation Age of Onset  . Diabetes Mother   . Hyperlipidemia Mother   . Hypertension Father     Social History:  reports that he has never smoked. He has quit using smokeless tobacco.  His smokeless tobacco use included snuff. He reports that he does not drink alcohol or use drugs.  No Known Allergies  Medications: I have reviewed the patient's current medications.   Physical Examination: Blood pressure (!) 146/63, pulse (!) 59, temperature 98.8 F (37.1 C), temperature source Oral, resp. rate 18, height 5\' 4"  (1.626 m), weight 68.5 kg, SpO2 100 %.   Neurological Examination   Mental Status: Alert, oriented, thought content appropriate.  Speech fluent without evidence of aphasia.  Able to follow 3 step commands without difficulty. Cranial Nerves: II: Discs flat bilaterally Monocular diplopia when looking to R side.  III,IV, VI: ptosis not present, extra-ocular motions intact bilaterally V,VII: smile symmetric, facial light touch sensation normal bilaterally VIII: hearing normal bilaterally IX,X: gag reflex present XI: bilateral  shoulder shrug XII: midline tongue extension Motor: Right : Upper extremity   5/5    Left:     Upper extremity   5/5  Lower extremity   5/5     Lower extremity   5/5 Tone and bulk:normal tone throughout; no atrophy noted Sensory: Pinprick and light touch intact throughout, bilaterally Deep Tendon Reflexes: 1+ and symmetric throughout Plantars: Right: downgoing   Left: downgoing Cerebellar: normal finger-to-nose      Laboratory Studies:   Basic Metabolic Panel: Recent Labs  Lab 05/04/19 1413 05/04/19 2135  NA 136  --   K 5.2*  --   CL 95*  --   CO2 26  --   GLUCOSE 190*  --   BUN 35*  --   CREATININE 7.42* 7.68*  CALCIUM 9.0  --     Liver Function Tests: No results for input(s): AST, ALT, ALKPHOS, BILITOT, PROT, ALBUMIN in the last 168 hours. No results for input(s): LIPASE, AMYLASE in the last 168 hours. No results for input(s): AMMONIA in the last 168 hours.  CBC: Recent Labs  Lab 05/04/19 1413 05/04/19 2135  WBC 14.2* 11.8*  NEUTROABS 10.5*  --   HGB 11.5* 10.8*  HCT 35.7* 33.2*  MCV 96.0 95.1  PLT 228 236    Cardiac Enzymes: No results for input(s): CKTOTAL, CKMB, CKMBINDEX, TROPONINI in the last 168 hours.  BNP: Invalid input(s): POCBNP  CBG: Recent Labs  Lab 05/05/19 2037 05/05/19 2342 05/06/19 0409 05/06/19 0743 05/06/19 1136  GLUCAP 176* 145* 125* 110* 135*    Microbiology: Results for orders placed  or performed during the hospital encounter of 05/04/19  SARS CORONAVIRUS 2 (TAT 6-24 HRS) Nasopharyngeal Nasopharyngeal Swab     Status: None   Collection Time: 05/04/19  9:35 PM   Specimen: Nasopharyngeal Swab  Result Value Ref Range Status   SARS Coronavirus 2 NEGATIVE NEGATIVE Final    Comment: (NOTE) SARS-CoV-2 target nucleic acids are NOT DETECTED. The SARS-CoV-2 RNA is generally detectable in upper and lower respiratory specimens during the acute phase of infection. Negative results do not preclude SARS-CoV-2 infection, do not rule  out co-infections with other pathogens, and should not be used as the sole basis for treatment or other patient management decisions. Negative results must be combined with clinical observations, patient history, and epidemiological information. The expected result is Negative. Fact Sheet for Patients: SugarRoll.be Fact Sheet for Healthcare Providers: https://www.woods-mathews.com/ This test is not yet approved or cleared by the [place] FDA and  has been authorized for detection and/or diagnosis of SARS-CoV-2 by FDA under an Emergency Use Authorization (EUA). This EUA will remain  in effect (meaning this test can be used) for the duration of the COVID-19 declaration under Section 56 4(b)(1) of the Act, 21 U.S.C. section 360bbb-3(b)(1), unless the authorization is terminated or revoked sooner. Performed at [facility], [place] [address] [place], [place] [address]   MRSA PCR Screening     Status: None   Collection Time: 05/05/19 12:41 AM   Specimen: Nasopharyngeal  Result Value Ref Range Status   MRSA by PCR NEGATIVE NEGATIVE Final    Comment:        The GeneXpert MRSA Assay (FDA approved for NASAL specimens only), is one component of a comprehensive MRSA colonization surveillance program. It is not intended to diagnose MRSA infection nor to guide or monitor treatment for MRSA infections. Performed at [facility], [address] [place], [place] [address]     Coagulation Studies: No results for input(s): LABPROT, INR in the last 72 hours.  Urinalysis: No results for input(s): COLORURINE, LABSPEC, PHURINE, GLUCOSEU, HGBUR, BILIRUBINUR, KETONESUR, PROTEINUR, UROBILINOGEN, NITRITE, LEUKOCYTESUR in the last 168 hours.  Invalid input(s): APPERANCEUR  Lipid Panel:     Component Value Date/Time   CHOL 144 05/05/2019 0634   TRIG 301 (H) 05/05/2019 0634   HDL 29 (L) 05/05/2019 0634   CHOLHDL 5.0 05/05/2019 0634    VLDL 60 (H) 05/05/2019 0634   LDLCALC 55 05/05/2019 0634    HgbA1C:  Lab Results  Component Value Date   HGBA1C 6.7 (H) 05/05/2019    Urine Drug Screen:  No results found for: LABOPIA, COCAINSCRNUR, LABBENZ, AMPHETMU, THCU, LABBARB  Alcohol Level: No results for input(s): ETH in the last 168 hours.  Other results: EKG: normal EKG, normal sinus rhythm, unchanged from previous tracings.  Imaging: DG Chest 2 View  Result Date: 05/04/2019 CLINICAL DATA:  Dizziness. EXAM: CHEST - 2 VIEW COMPARISON:  01/21/2018 FINDINGS: There is a well-positioned tunneled dialysis catheter on the right. The heart size is stable from prior study. There is no pneumothorax or significant pleural effusion. No large focal infiltrate. There is no acute osseous abnormality. IMPRESSION: No acute cardiopulmonary process. Electronically Signed   By: Constance Holster M.D.   On: 05/04/2019 20:05   MR Brain Wo Contrast (neuro protocol)  Result Date: 05/04/2019 CLINICAL DATA:  Double vision EXAM: MRI HEAD AND ORBITS WITHOUT CONTRAST TECHNIQUE: Multiplanar, multiecho pulse sequences of the brain and surrounding structures were obtained without intravenous contrast. Multiplanar, multiecho pulse sequences of the orbits  and surrounding structures were obtained including fat saturation techniques, without intravenous contrast administration. COMPARISON:  09/12/2013 FINDINGS: MRI HEAD FINDINGS Brain: Acute punctate infarction in the medial longitudinal fasciculus region of the dorsal pons on the left could certainly explain double vision. No other acute brain infarction. Old small 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 Mild chronic small-vessel ischemic changes affect the cerebral hemispheric white matter elsewhere. No other large vessel territory infarction. No mass lesion, hemorrhage, hydrocephalus or extra-axial collection. Vascular: Major  vessels at the base of the brain show flow. Skull and upper cervical spine: Negative Other: Retention cysts of the maxillary sinuses. MRI ORBITS FINDINGS Orbits: Both globes appear normal. Previous lens implants. Optic nerves are normal. Extra-ocular muscles are normal. Orbital fat is normal. Lacrimal glands are normal. Visualized sinuses: Retention cysts as noted above. Soft tissues: Otherwise negative Limited intracranial: See results of brain study above. IMPRESSION: Acute infarction of the medial longitudinal fasciculus region of the dorsal pons on the left which could certainly explain double vision. 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 Mild chronic small-vessel change elsewhere affecting the brain as described above. No primary orbital pathology seen. Electronically Signed   By: Nelson Chimes M.D.   On: 05/04/2019 17:12   US Carotid Bilateral (at [facility] and [facility] only)  Result Date: 05/05/2019 CLINICAL DATA:  73 year old male with a stroke EXAM: BILATERAL CAROTID DUPLEX ULTRASOUND TECHNIQUE: Pearline Cables scale imaging, color Doppler and duplex ultrasound were performed of bilateral carotid and vertebral arteries in the neck. COMPARISON:  09/12/2013 FINDINGS: Criteria: Quantification of carotid stenosis is based on velocity parameters that correlate the residual internal carotid diameter with NASCET-based stenosis levels, using the diameter of the distal internal carotid lumen as the denominator for stenosis measurement. The following velocity measurements were obtained: RIGHT ICA:  Systolic 973 cm/sec, Diastolic 23 cm/sec CCA:  97 cm/sec SYSTOLIC ICA/CCA RATIO:  1.0 ECA:  152 cm/sec LEFT ICA:  Systolic 77 cm/sec, Diastolic 18 cm/sec CCA:  87 cm/sec SYSTOLIC ICA/CCA RATIO:  0.9 ECA:  137 cm/sec Right Brachial SBP: Not acquired Left Brachial SBP: Not acquired RIGHT CAROTID ARTERY: No significant calcifications of the right common carotid artery. Intermediate waveform maintained. Heterogeneous and partially calcified plaque at  the right carotid bifurcation. No significant lumen shadowing. Low resistance waveform of the right ICA. Mild tortuosity RIGHT VERTEBRAL ARTERY: Antegrade flow with low resistance waveform. LEFT CAROTID ARTERY: No significant calcifications of the left common carotid artery. Intermediate waveform maintained. Heterogeneous and partially calcified plaque at the left carotid bifurcation without significant lumen shadowing. Low resistance waveform of the left ICA. Mild tortuosity LEFT VERTEBRAL ARTERY:  Antegrade flow with low resistance waveform. IMPRESSION: Color duplex indicates minimal heterogeneous and calcified plaque, with no hemodynamically significant stenosis by duplex criteria in the extracranial cerebrovascular circulation. Signed, Dulcy Fanny. Dellia Nims, RPVI [facility] Electronically Signed   By: Corrie Mckusick D.O.   On: 05/05/2019 09:09   ECHOCARDIOGRAM COMPLETE  Result Date: 05/05/2019   ECHOCARDIOGRAM REPORT   Patient Name:   JAIMERE FEUTZ Date of Exam: 05/05/2019 Medical Rec #:  [id number]   Height:       64.0 in Accession #:    [id number]  Weight:       154.0 lb Date of Birth:  [birth year]    BSA:          1.75 m Patient Age:    32 years    BP:  127/52 mmHg Patient Gender: M           HR:           57 bpm. Exam Location:  [facility] Procedure: 2D Echo, Cardiac Doppler and Color Doppler Indications:     Stroke 434.91  History:         Patient has no prior history of Echocardiogram examinations.                  Risk Factors:Hypertension and Diabetes.  Sonographer:     Sherrie Sport RDCS (AE) Referring Phys:  [id number] Athena Masse Diagnosing Phys: Bartholome Bill MD IMPRESSIONS  1. Left ventricular ejection fraction, by visual estimation, is 60 to 65%. The left ventricle has normal function. Left ventricular septal wall thickness was mildly increased. Mildly increased left ventricular posterior wall thickness. There is borderline left ventricular  hypertrophy.  2. Global right ventricle has normal systolic function.The right ventricular size is mildly enlarged. No increase in right ventricular wall thickness.  3. Left atrial size was mildly dilated.  4. Right atrial size was mildly dilated.  5. The mitral valve is grossly normal. Trivial mitral valve regurgitation.  6. The tricuspid valve is grossly normal. Tricuspid valve regurgitation is mild.  7. The aortic valve was not well visualized. Aortic valve regurgitation is not visualized.  8. The pulmonic valve was not well visualized. Pulmonic valve regurgitation is trivial.  9. Mildly elevated pulmonary artery systolic pressure. 10. The atrial septum is grossly normal. FINDINGS  Left Ventricle: Left ventricular ejection fraction, by visual estimation, is 60 to 65%. The left ventricle has normal function. The left ventricle is not well visualized. Mildly increased left ventricular posterior wall thickness. There is borderline left ventricular hypertrophy. Left ventricular diastolic parameters were normal. Right Ventricle: The right ventricular size is mildly enlarged. No increase in right ventricular wall thickness. Global RV systolic function is has normal systolic function. The tricuspid regurgitant velocity is 2.36 m/s, and with an assumed right atrial  pressure of 10 mmHg, the estimated right ventricular systolic pressure is mildly elevated at 32.3 mmHg. Left Atrium: Left atrial size was mildly dilated. Right Atrium: Right atrial size was mildly dilated Pericardium: There is no evidence of pericardial effusion. Mitral Valve: The mitral valve is grossly normal. Trivial mitral valve regurgitation. Tricuspid Valve: The tricuspid valve is grossly normal. Tricuspid valve regurgitation is mild. Aortic Valve: The aortic valve was not well visualized. Aortic valve regurgitation is not visualized. Aortic valve mean gradient measures 4.0 mmHg. Aortic valve peak gradient measures 6.9 mmHg. Aortic valve area, by VTI  measures 2.77 cm. Pulmonic Valve: The pulmonic valve was not well visualized. Pulmonic valve regurgitation is trivial. Pulmonic regurgitation is trivial. Aorta: The aortic root is normal in size and structure. IAS/Shunts: The atrial septum is grossly normal.  LEFT VENTRICLE PLAX 2D LVIDd:         4.21 cm  Diastology LVIDs:         2.41 cm  LV e' lateral:   6.74 cm/s LV PW:         1.18 cm  LV E/e' lateral: 15.0 LV IVS:        1.06 cm  LV e' medial:    5.22 cm/s LVOT diam:     2.00 cm  LV E/e' medial:  19.3 LV SV:         59 ml LV SV Index:   32.73 LVOT Area:     3.14 cm  RIGHT VENTRICLE RV Basal  diam:  2.65 cm RV S prime:     10.10 cm/s TAPSE (M-mode): 3.2 cm LEFT ATRIUM             Index       RIGHT ATRIUM          Index LA diam:        3.80 cm 2.17 cm/m  RA Area:     7.76 cm LA Vol (A2C):   41.0 ml 23.42 ml/m RA Volume:   11.70 ml 6.68 ml/m LA Vol (A4C):   47.1 ml 26.90 ml/m LA Biplane Vol: 45.6 ml 26.05 ml/m  AORTIC VALVE                   PULMONIC VALVE AV Area (Vmax):    2.35 cm    PV Vmax:        1.04 m/s AV Area (Vmean):   2.32 cm    PV Peak grad:   4.3 mmHg AV Area (VTI):     2.77 cm    RVOT Peak grad: 4 mmHg AV Vmax:           131.50 cm/s AV Vmean:          91.450 cm/s AV VTI:            0.306 m AV Peak Grad:      6.9 mmHg AV Mean Grad:      4.0 mmHg LVOT Vmax:         98.30 cm/s LVOT Vmean:        67.400 cm/s LVOT VTI:          0.269 m LVOT/AV VTI ratio: 0.88  AORTA Ao Root diam: 2.60 cm MITRAL VALVE                         TRICUSPID VALVE MV Area (PHT): 3.60 cm              TR Peak grad:   22.3 mmHg MV PHT:        61.19 msec            TR Vmax:        236.00 cm/s MV Decel Time: 211 msec MV E velocity: 101.00 cm/s 103 cm/s  SHUNTS MV A velocity: 94.10 cm/s  70.3 cm/s Systemic VTI:  0.27 m MV E/A ratio:  1.07        1.5       Systemic Diam: 2.00 cm  Bartholome Bill MD Electronically signed by Bartholome Bill MD Signature Date/Time: 05/05/2019/11:14:35 AM    Final    MR ORBITS WO CONTRAST  Result  Date: 05/04/2019 CLINICAL DATA:  Double vision EXAM: MRI HEAD AND ORBITS WITHOUT CONTRAST TECHNIQUE: Multiplanar, multiecho pulse sequences of the brain and surrounding structures were obtained without intravenous contrast. Multiplanar, multiecho pulse sequences of the orbits and surrounding structures were obtained including fat saturation techniques, without intravenous contrast administration. COMPARISON:  09/12/2013 FINDINGS: MRI HEAD FINDINGS Brain: Acute punctate infarction in the medial longitudinal fasciculus region of the dorsal pons on the left could certainly explain double vision. No other acute brain infarction. Old small 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 Mild chronic small-vessel ischemic changes affect the cerebral hemispheric white matter elsewhere. No other large vessel territory infarction. No mass lesion, hemorrhage, hydrocephalus or extra-axial collection. Vascular: Major vessels at the base of the brain show flow. Skull  and upper cervical spine: Negative Other: Retention cysts of the maxillary sinuses. MRI ORBITS FINDINGS Orbits: Both globes appear normal. Previous lens implants. Optic nerves are normal. Extra-ocular muscles are normal. Orbital fat is normal. Lacrimal glands are normal. Visualized sinuses: Retention cysts as noted above. Soft tissues: Otherwise negative Limited intracranial: See results of brain study above. IMPRESSION: Acute infarction of the medial longitudinal fasciculus region of the dorsal pons on the left which could certainly explain double vision. 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 Mild chronic small-vessel change elsewhere affecting the brain as described above. No primary orbital pathology seen. Electronically Signed   By: Nelson Chimes M.D.   On: 05/04/2019 17:12     Assessment/Plan: 73 y.o. 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  Pt states vision is worse when looking to the right side.  Pt has as stroke on L side MLF.  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   - ASA 81 mg on admission - ASA full dose on D/c - Statin - d/w wife who will be doing all the driving - neurology follow up as out patient .  05/06/2019, 12:35 PM

## 2019-05-06 NOTE — Evaluation (Signed)
Occupational Therapy Evaluation Patient Details Name: Joseph Mcpherson MRN: [id number] DOB: [birth year] Today's Date: 05/06/2019    History of Present Illness From Neuro MD note: Pt is a 73 y.o. 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  Pt states vision is worse when looking to the right side.  Pt has as stroke on L side MLF.- but symptoms cleared since yesterday   Clinical Impression   Upon OT eval , pt coming out of bathroom, functional mobility Independent with supervision - no LOB and good safety. Pt while standing was able to read from L to R , no issues with tracking visually and crossing midline. No coordination issues with hands. AROM and strength in bilateral UE WFL- no pain reported - per pt getting ready to be discharge today - no OT needs     Follow Up Recommendations  No OT follow up    Equipment Recommendations       Recommendations for Other Services       Precautions / Restrictions        Mobility Bed Mobility                  Transfers                 General transfer comment: Pt independent with supervision to bathroom , in and out of bed, and at sink - no LOB    Balance                                           ADL either performed or assessed with clinical judgement   ADL Overall ADL's : Independent                                             Vision Baseline Vision/History: No visual deficits Vision Assessment?: Yes Additional Comments: had cataract surgy in past - could read for L to R - no issues - and track cross midline and up /down; no issues vision in all 4 quadrants     Perception     Praxis      Pertinent Vitals/Pain Pain Assessment: No/denies pain     Hand  Dominance Right   Extremity/Trunk Assessment Upper Extremity Assessment Upper Extremity Assessment: Overall WFL for tasks assessed           Communication Communication Communication: No difficulties   Cognition Arousal/Alertness: Awake/alert Behavior During Therapy: WFL for tasks assessed/performed Overall Cognitive Status: Within Functional Limits for tasks assessed                                     General Comments  only issue was one leg stand , tandem stand - had to hold on    Exercises     Shoulder Instructions      Home Living Family/patient expects to be discharged to:: Private residence Living Arrangements: Spouse/significant other Available Help at Discharge: Family;Available 24 hours/day Type of Home: House       Home Layout: Two level;Able to live on  main level with bedroom/bathroom Alternate Level Stairs-Number of Steps: Per pt he don't go upstairs   Bathroom Shower/Tub: Astronomer Accessibility: Yes   Home Equipment: Grab bars - toilet;Grab bars - tub/shower          Prior Functioning/Environment Level of Independence: Independent        Comments: Independent - driving        OT Problem List:        OT Treatment/Interventions:      OT Goals(Current goals can be found in the care plan section) Acute Rehab OT Goals Patient Stated Goal: go home OT Goal Formulation: With patient Time For Goal Achievement: 05/06/19 Potential to Achieve Goals: Good  OT Frequency:     Barriers to D/C:            Co-evaluation              AM-PAC OT "6 Clicks" Daily Activity     Outcome Measure Help from another person eating meals?: None Help from another person taking care of personal grooming?: None Help from another person toileting, which includes using toliet, bedpan, or urinal?: None Help from another person bathing (including washing, rinsing, drying)?: None Help from another person to put on and  taking off regular upper body clothing?: None Help from another person to put on and taking off regular lower body clothing?: None 6 Click Score: 24   End of Session Nurse Communication: Mobility status  Activity Tolerance: Patient tolerated treatment well Patient left: in bed;with call bell/phone within reach;with bed alarm set                   Time: 3790-2409 OT Time Calculation (min): 18 min Charges:  OT General Charges $OT Visit: 1 Visit OT Evaluation $OT Eval Low Complexity: 1 Low    Marvell Stavola OTR/L,CLT 05/06/2019, 12:30 PM

## 2019-05-06 NOTE — Discharge Instructions (Signed)

## 2019-05-07 DIAGNOSIS — I120 Hypertensive chronic kidney disease with stage 5 chronic kidney disease or end stage renal disease: Secondary | ICD-10-CM | POA: Diagnosis not present

## 2019-05-07 DIAGNOSIS — N186 End stage renal disease: Secondary | ICD-10-CM | POA: Diagnosis not present

## 2019-05-07 DIAGNOSIS — K219 Gastro-esophageal reflux disease without esophagitis: Secondary | ICD-10-CM | POA: Diagnosis not present

## 2019-05-07 DIAGNOSIS — E039 Hypothyroidism, unspecified: Secondary | ICD-10-CM | POA: Diagnosis not present

## 2019-05-07 DIAGNOSIS — E785 Hyperlipidemia, unspecified: Secondary | ICD-10-CM | POA: Diagnosis not present

## 2019-05-07 DIAGNOSIS — D689 Coagulation defect, unspecified: Secondary | ICD-10-CM | POA: Diagnosis not present

## 2019-05-07 DIAGNOSIS — I69398 Other sequelae of cerebral infarction: Secondary | ICD-10-CM | POA: Diagnosis not present

## 2019-05-07 DIAGNOSIS — R419 Unspecified symptoms and signs involving cognitive functions and awareness: Secondary | ICD-10-CM | POA: Diagnosis not present

## 2019-05-07 DIAGNOSIS — E1122 Type 2 diabetes mellitus with diabetic chronic kidney disease: Secondary | ICD-10-CM | POA: Diagnosis not present

## 2019-05-07 DIAGNOSIS — H532 Diplopia: Secondary | ICD-10-CM | POA: Diagnosis not present

## 2019-05-09 DIAGNOSIS — Z992 Dependence on renal dialysis: Secondary | ICD-10-CM | POA: Diagnosis not present

## 2019-05-09 DIAGNOSIS — I10 Essential (primary) hypertension: Secondary | ICD-10-CM | POA: Diagnosis not present

## 2019-05-09 DIAGNOSIS — N186 End stage renal disease: Secondary | ICD-10-CM | POA: Diagnosis not present

## 2019-05-09 DIAGNOSIS — M159 Polyosteoarthritis, unspecified: Secondary | ICD-10-CM | POA: Diagnosis not present

## 2019-05-11 DIAGNOSIS — N186 End stage renal disease: Secondary | ICD-10-CM | POA: Diagnosis not present

## 2019-05-11 DIAGNOSIS — D689 Coagulation defect, unspecified: Secondary | ICD-10-CM | POA: Diagnosis not present

## 2019-05-11 DIAGNOSIS — I120 Hypertensive chronic kidney disease with stage 5 chronic kidney disease or end stage renal disease: Secondary | ICD-10-CM | POA: Diagnosis not present

## 2019-05-11 DIAGNOSIS — E785 Hyperlipidemia, unspecified: Secondary | ICD-10-CM | POA: Diagnosis not present

## 2019-05-11 DIAGNOSIS — E039 Hypothyroidism, unspecified: Secondary | ICD-10-CM | POA: Diagnosis not present

## 2019-05-11 DIAGNOSIS — K219 Gastro-esophageal reflux disease without esophagitis: Secondary | ICD-10-CM | POA: Diagnosis not present

## 2019-05-11 DIAGNOSIS — R419 Unspecified symptoms and signs involving cognitive functions and awareness: Secondary | ICD-10-CM | POA: Diagnosis not present

## 2019-05-11 DIAGNOSIS — E1122 Type 2 diabetes mellitus with diabetic chronic kidney disease: Secondary | ICD-10-CM | POA: Diagnosis not present

## 2019-05-11 DIAGNOSIS — H532 Diplopia: Secondary | ICD-10-CM | POA: Diagnosis not present

## 2019-05-11 DIAGNOSIS — I69398 Other sequelae of cerebral infarction: Secondary | ICD-10-CM | POA: Diagnosis not present

## 2019-05-16 DIAGNOSIS — E785 Hyperlipidemia, unspecified: Secondary | ICD-10-CM | POA: Diagnosis not present

## 2019-05-16 DIAGNOSIS — I69398 Other sequelae of cerebral infarction: Secondary | ICD-10-CM | POA: Diagnosis not present

## 2019-05-16 DIAGNOSIS — G301 Alzheimer's disease with late onset: Secondary | ICD-10-CM | POA: Diagnosis not present

## 2019-05-16 DIAGNOSIS — R419 Unspecified symptoms and signs involving cognitive functions and awareness: Secondary | ICD-10-CM | POA: Diagnosis not present

## 2019-05-16 DIAGNOSIS — K219 Gastro-esophageal reflux disease without esophagitis: Secondary | ICD-10-CM | POA: Diagnosis not present

## 2019-05-16 DIAGNOSIS — N186 End stage renal disease: Secondary | ICD-10-CM | POA: Diagnosis not present

## 2019-05-16 DIAGNOSIS — I120 Hypertensive chronic kidney disease with stage 5 chronic kidney disease or end stage renal disease: Secondary | ICD-10-CM | POA: Diagnosis not present

## 2019-05-16 DIAGNOSIS — E039 Hypothyroidism, unspecified: Secondary | ICD-10-CM | POA: Diagnosis not present

## 2019-05-16 DIAGNOSIS — D689 Coagulation defect, unspecified: Secondary | ICD-10-CM | POA: Diagnosis not present

## 2019-05-16 DIAGNOSIS — R69 Illness, unspecified: Secondary | ICD-10-CM | POA: Diagnosis not present

## 2019-05-16 DIAGNOSIS — I6381 Other cerebral infarction due to occlusion or stenosis of small artery: Secondary | ICD-10-CM | POA: Diagnosis not present

## 2019-05-16 DIAGNOSIS — H532 Diplopia: Secondary | ICD-10-CM | POA: Diagnosis not present

## 2019-05-16 DIAGNOSIS — E1122 Type 2 diabetes mellitus with diabetic chronic kidney disease: Secondary | ICD-10-CM | POA: Diagnosis not present

## 2019-05-24 DIAGNOSIS — D689 Coagulation defect, unspecified: Secondary | ICD-10-CM | POA: Diagnosis not present

## 2019-05-24 DIAGNOSIS — N186 End stage renal disease: Secondary | ICD-10-CM | POA: Diagnosis not present

## 2019-05-24 DIAGNOSIS — Z992 Dependence on renal dialysis: Secondary | ICD-10-CM | POA: Diagnosis not present

## 2019-05-24 DIAGNOSIS — E1140 Type 2 diabetes mellitus with diabetic neuropathy, unspecified: Secondary | ICD-10-CM | POA: Diagnosis not present

## 2019-05-24 DIAGNOSIS — N2581 Secondary hyperparathyroidism of renal origin: Secondary | ICD-10-CM | POA: Diagnosis not present

## 2019-05-24 DIAGNOSIS — E1129 Type 2 diabetes mellitus with other diabetic kidney complication: Secondary | ICD-10-CM | POA: Diagnosis not present

## 2019-05-24 DIAGNOSIS — D509 Iron deficiency anemia, unspecified: Secondary | ICD-10-CM | POA: Diagnosis not present

## 2019-05-24 DIAGNOSIS — D631 Anemia in chronic kidney disease: Secondary | ICD-10-CM | POA: Diagnosis not present

## 2019-05-25 DIAGNOSIS — Z992 Dependence on renal dialysis: Secondary | ICD-10-CM | POA: Diagnosis not present

## 2019-05-25 DIAGNOSIS — E1122 Type 2 diabetes mellitus with diabetic chronic kidney disease: Secondary | ICD-10-CM | POA: Diagnosis not present

## 2019-05-25 DIAGNOSIS — N186 End stage renal disease: Secondary | ICD-10-CM | POA: Diagnosis not present

## 2019-05-27 DIAGNOSIS — N186 End stage renal disease: Secondary | ICD-10-CM | POA: Diagnosis not present

## 2019-05-27 DIAGNOSIS — Z992 Dependence on renal dialysis: Secondary | ICD-10-CM | POA: Diagnosis not present

## 2019-05-27 DIAGNOSIS — N2581 Secondary hyperparathyroidism of renal origin: Secondary | ICD-10-CM | POA: Diagnosis not present

## 2019-05-29 DIAGNOSIS — Z992 Dependence on renal dialysis: Secondary | ICD-10-CM | POA: Diagnosis not present

## 2019-05-29 DIAGNOSIS — N186 End stage renal disease: Secondary | ICD-10-CM | POA: Diagnosis not present

## 2019-05-29 DIAGNOSIS — N2581 Secondary hyperparathyroidism of renal origin: Secondary | ICD-10-CM | POA: Diagnosis not present

## 2019-05-31 DIAGNOSIS — N2581 Secondary hyperparathyroidism of renal origin: Secondary | ICD-10-CM | POA: Diagnosis not present

## 2019-05-31 DIAGNOSIS — N186 End stage renal disease: Secondary | ICD-10-CM | POA: Diagnosis not present

## 2019-05-31 DIAGNOSIS — Z992 Dependence on renal dialysis: Secondary | ICD-10-CM | POA: Diagnosis not present

## 2019-05-31 DIAGNOSIS — I6381 Other cerebral infarction due to occlusion or stenosis of small artery: Secondary | ICD-10-CM | POA: Insufficient documentation

## 2019-06-02 DIAGNOSIS — Z992 Dependence on renal dialysis: Secondary | ICD-10-CM | POA: Diagnosis not present

## 2019-06-02 DIAGNOSIS — N2581 Secondary hyperparathyroidism of renal origin: Secondary | ICD-10-CM | POA: Diagnosis not present

## 2019-06-02 DIAGNOSIS — N186 End stage renal disease: Secondary | ICD-10-CM | POA: Diagnosis not present

## 2019-06-05 DIAGNOSIS — N2581 Secondary hyperparathyroidism of renal origin: Secondary | ICD-10-CM | POA: Diagnosis not present

## 2019-06-05 DIAGNOSIS — N186 End stage renal disease: Secondary | ICD-10-CM | POA: Diagnosis not present

## 2019-06-05 DIAGNOSIS — Z992 Dependence on renal dialysis: Secondary | ICD-10-CM | POA: Diagnosis not present

## 2019-06-06 DIAGNOSIS — Z452 Encounter for adjustment and management of vascular access device: Secondary | ICD-10-CM | POA: Diagnosis not present

## 2019-06-07 DIAGNOSIS — Z992 Dependence on renal dialysis: Secondary | ICD-10-CM | POA: Diagnosis not present

## 2019-06-07 DIAGNOSIS — N2581 Secondary hyperparathyroidism of renal origin: Secondary | ICD-10-CM | POA: Diagnosis not present

## 2019-06-07 DIAGNOSIS — N186 End stage renal disease: Secondary | ICD-10-CM | POA: Diagnosis not present

## 2019-06-08 DIAGNOSIS — I871 Compression of vein: Secondary | ICD-10-CM | POA: Diagnosis not present

## 2019-06-08 DIAGNOSIS — Z992 Dependence on renal dialysis: Secondary | ICD-10-CM | POA: Diagnosis not present

## 2019-06-08 DIAGNOSIS — T82858A Stenosis of vascular prosthetic devices, implants and grafts, initial encounter: Secondary | ICD-10-CM | POA: Diagnosis not present

## 2019-06-08 DIAGNOSIS — E113393 Type 2 diabetes mellitus with moderate nonproliferative diabetic retinopathy without macular edema, bilateral: Secondary | ICD-10-CM | POA: Diagnosis not present

## 2019-06-08 DIAGNOSIS — N186 End stage renal disease: Secondary | ICD-10-CM | POA: Diagnosis not present

## 2019-06-09 DIAGNOSIS — N186 End stage renal disease: Secondary | ICD-10-CM | POA: Diagnosis not present

## 2019-06-09 DIAGNOSIS — Z992 Dependence on renal dialysis: Secondary | ICD-10-CM | POA: Diagnosis not present

## 2019-06-09 DIAGNOSIS — N2581 Secondary hyperparathyroidism of renal origin: Secondary | ICD-10-CM | POA: Diagnosis not present

## 2019-06-12 DIAGNOSIS — Z992 Dependence on renal dialysis: Secondary | ICD-10-CM | POA: Diagnosis not present

## 2019-06-12 DIAGNOSIS — N2581 Secondary hyperparathyroidism of renal origin: Secondary | ICD-10-CM | POA: Diagnosis not present

## 2019-06-12 DIAGNOSIS — N186 End stage renal disease: Secondary | ICD-10-CM | POA: Diagnosis not present

## 2019-06-14 DIAGNOSIS — Z992 Dependence on renal dialysis: Secondary | ICD-10-CM | POA: Diagnosis not present

## 2019-06-14 DIAGNOSIS — N186 End stage renal disease: Secondary | ICD-10-CM | POA: Diagnosis not present

## 2019-06-14 DIAGNOSIS — N2581 Secondary hyperparathyroidism of renal origin: Secondary | ICD-10-CM | POA: Diagnosis not present

## 2019-06-16 DIAGNOSIS — Z992 Dependence on renal dialysis: Secondary | ICD-10-CM | POA: Diagnosis not present

## 2019-06-16 DIAGNOSIS — N2581 Secondary hyperparathyroidism of renal origin: Secondary | ICD-10-CM | POA: Diagnosis not present

## 2019-06-16 DIAGNOSIS — N186 End stage renal disease: Secondary | ICD-10-CM | POA: Diagnosis not present

## 2019-06-19 DIAGNOSIS — N186 End stage renal disease: Secondary | ICD-10-CM | POA: Diagnosis not present

## 2019-06-19 DIAGNOSIS — N2581 Secondary hyperparathyroidism of renal origin: Secondary | ICD-10-CM | POA: Diagnosis not present

## 2019-06-19 DIAGNOSIS — Z992 Dependence on renal dialysis: Secondary | ICD-10-CM | POA: Diagnosis not present

## 2019-06-21 DIAGNOSIS — Z992 Dependence on renal dialysis: Secondary | ICD-10-CM | POA: Diagnosis not present

## 2019-06-21 DIAGNOSIS — N186 End stage renal disease: Secondary | ICD-10-CM | POA: Diagnosis not present

## 2019-06-21 DIAGNOSIS — N2581 Secondary hyperparathyroidism of renal origin: Secondary | ICD-10-CM | POA: Diagnosis not present

## 2019-06-23 DIAGNOSIS — Z992 Dependence on renal dialysis: Secondary | ICD-10-CM | POA: Diagnosis not present

## 2019-06-23 DIAGNOSIS — N2581 Secondary hyperparathyroidism of renal origin: Secondary | ICD-10-CM | POA: Diagnosis not present

## 2019-06-23 DIAGNOSIS — N186 End stage renal disease: Secondary | ICD-10-CM | POA: Diagnosis not present

## 2019-06-25 DIAGNOSIS — E1122 Type 2 diabetes mellitus with diabetic chronic kidney disease: Secondary | ICD-10-CM | POA: Diagnosis not present

## 2019-06-25 DIAGNOSIS — N186 End stage renal disease: Secondary | ICD-10-CM | POA: Diagnosis not present

## 2019-06-25 DIAGNOSIS — Z992 Dependence on renal dialysis: Secondary | ICD-10-CM | POA: Diagnosis not present

## 2019-06-26 DIAGNOSIS — N186 End stage renal disease: Secondary | ICD-10-CM | POA: Diagnosis not present

## 2019-06-26 DIAGNOSIS — N2581 Secondary hyperparathyroidism of renal origin: Secondary | ICD-10-CM | POA: Diagnosis not present

## 2019-06-26 DIAGNOSIS — Z992 Dependence on renal dialysis: Secondary | ICD-10-CM | POA: Diagnosis not present

## 2019-06-28 DIAGNOSIS — Z992 Dependence on renal dialysis: Secondary | ICD-10-CM | POA: Diagnosis not present

## 2019-06-28 DIAGNOSIS — N2581 Secondary hyperparathyroidism of renal origin: Secondary | ICD-10-CM | POA: Diagnosis not present

## 2019-06-28 DIAGNOSIS — N186 End stage renal disease: Secondary | ICD-10-CM | POA: Diagnosis not present

## 2019-06-30 DIAGNOSIS — N2581 Secondary hyperparathyroidism of renal origin: Secondary | ICD-10-CM | POA: Diagnosis not present

## 2019-06-30 DIAGNOSIS — N186 End stage renal disease: Secondary | ICD-10-CM | POA: Diagnosis not present

## 2019-06-30 DIAGNOSIS — Z992 Dependence on renal dialysis: Secondary | ICD-10-CM | POA: Diagnosis not present

## 2019-07-03 DIAGNOSIS — N186 End stage renal disease: Secondary | ICD-10-CM | POA: Diagnosis not present

## 2019-07-03 DIAGNOSIS — N2581 Secondary hyperparathyroidism of renal origin: Secondary | ICD-10-CM | POA: Diagnosis not present

## 2019-07-03 DIAGNOSIS — Z992 Dependence on renal dialysis: Secondary | ICD-10-CM | POA: Diagnosis not present

## 2019-07-05 DIAGNOSIS — Z992 Dependence on renal dialysis: Secondary | ICD-10-CM | POA: Diagnosis not present

## 2019-07-05 DIAGNOSIS — N2581 Secondary hyperparathyroidism of renal origin: Secondary | ICD-10-CM | POA: Diagnosis not present

## 2019-07-05 DIAGNOSIS — N186 End stage renal disease: Secondary | ICD-10-CM | POA: Diagnosis not present

## 2019-07-07 DIAGNOSIS — N2581 Secondary hyperparathyroidism of renal origin: Secondary | ICD-10-CM | POA: Diagnosis not present

## 2019-07-07 DIAGNOSIS — N186 End stage renal disease: Secondary | ICD-10-CM | POA: Diagnosis not present

## 2019-07-07 DIAGNOSIS — Z992 Dependence on renal dialysis: Secondary | ICD-10-CM | POA: Diagnosis not present

## 2019-07-10 DIAGNOSIS — N186 End stage renal disease: Secondary | ICD-10-CM | POA: Diagnosis not present

## 2019-07-10 DIAGNOSIS — Z992 Dependence on renal dialysis: Secondary | ICD-10-CM | POA: Diagnosis not present

## 2019-07-10 DIAGNOSIS — N2581 Secondary hyperparathyroidism of renal origin: Secondary | ICD-10-CM | POA: Diagnosis not present

## 2019-07-12 DIAGNOSIS — N186 End stage renal disease: Secondary | ICD-10-CM | POA: Diagnosis not present

## 2019-07-12 DIAGNOSIS — N2581 Secondary hyperparathyroidism of renal origin: Secondary | ICD-10-CM | POA: Diagnosis not present

## 2019-07-12 DIAGNOSIS — Z992 Dependence on renal dialysis: Secondary | ICD-10-CM | POA: Diagnosis not present

## 2019-07-14 DIAGNOSIS — Z992 Dependence on renal dialysis: Secondary | ICD-10-CM | POA: Diagnosis not present

## 2019-07-14 DIAGNOSIS — N2581 Secondary hyperparathyroidism of renal origin: Secondary | ICD-10-CM | POA: Diagnosis not present

## 2019-07-14 DIAGNOSIS — N186 End stage renal disease: Secondary | ICD-10-CM | POA: Diagnosis not present

## 2019-07-17 DIAGNOSIS — N186 End stage renal disease: Secondary | ICD-10-CM | POA: Diagnosis not present

## 2019-07-17 DIAGNOSIS — Z992 Dependence on renal dialysis: Secondary | ICD-10-CM | POA: Diagnosis not present

## 2019-07-17 DIAGNOSIS — N2581 Secondary hyperparathyroidism of renal origin: Secondary | ICD-10-CM | POA: Diagnosis not present

## 2019-07-19 DIAGNOSIS — N186 End stage renal disease: Secondary | ICD-10-CM | POA: Diagnosis not present

## 2019-07-19 DIAGNOSIS — N2581 Secondary hyperparathyroidism of renal origin: Secondary | ICD-10-CM | POA: Diagnosis not present

## 2019-07-19 DIAGNOSIS — Z992 Dependence on renal dialysis: Secondary | ICD-10-CM | POA: Diagnosis not present

## 2019-07-21 DIAGNOSIS — N186 End stage renal disease: Secondary | ICD-10-CM | POA: Diagnosis not present

## 2019-07-21 DIAGNOSIS — Z992 Dependence on renal dialysis: Secondary | ICD-10-CM | POA: Diagnosis not present

## 2019-07-21 DIAGNOSIS — N2581 Secondary hyperparathyroidism of renal origin: Secondary | ICD-10-CM | POA: Diagnosis not present

## 2019-07-23 DIAGNOSIS — Z992 Dependence on renal dialysis: Secondary | ICD-10-CM | POA: Diagnosis not present

## 2019-07-23 DIAGNOSIS — N186 End stage renal disease: Secondary | ICD-10-CM | POA: Diagnosis not present

## 2019-07-23 DIAGNOSIS — E1122 Type 2 diabetes mellitus with diabetic chronic kidney disease: Secondary | ICD-10-CM | POA: Diagnosis not present

## 2019-07-24 DIAGNOSIS — N186 End stage renal disease: Secondary | ICD-10-CM | POA: Diagnosis not present

## 2019-07-24 DIAGNOSIS — Z992 Dependence on renal dialysis: Secondary | ICD-10-CM | POA: Diagnosis not present

## 2019-07-24 DIAGNOSIS — N2581 Secondary hyperparathyroidism of renal origin: Secondary | ICD-10-CM | POA: Diagnosis not present

## 2019-07-25 DIAGNOSIS — D485 Neoplasm of uncertain behavior of skin: Secondary | ICD-10-CM | POA: Diagnosis not present

## 2019-07-25 DIAGNOSIS — B081 Molluscum contagiosum: Secondary | ICD-10-CM | POA: Diagnosis not present

## 2019-07-25 DIAGNOSIS — E119 Type 2 diabetes mellitus without complications: Secondary | ICD-10-CM | POA: Diagnosis not present

## 2019-07-26 DIAGNOSIS — N2581 Secondary hyperparathyroidism of renal origin: Secondary | ICD-10-CM | POA: Diagnosis not present

## 2019-07-26 DIAGNOSIS — N186 End stage renal disease: Secondary | ICD-10-CM | POA: Diagnosis not present

## 2019-07-26 DIAGNOSIS — Z992 Dependence on renal dialysis: Secondary | ICD-10-CM | POA: Diagnosis not present

## 2019-07-28 DIAGNOSIS — N2581 Secondary hyperparathyroidism of renal origin: Secondary | ICD-10-CM | POA: Diagnosis not present

## 2019-07-28 DIAGNOSIS — N186 End stage renal disease: Secondary | ICD-10-CM | POA: Diagnosis not present

## 2019-07-28 DIAGNOSIS — Z992 Dependence on renal dialysis: Secondary | ICD-10-CM | POA: Diagnosis not present

## 2019-07-31 DIAGNOSIS — N2581 Secondary hyperparathyroidism of renal origin: Secondary | ICD-10-CM | POA: Diagnosis not present

## 2019-07-31 DIAGNOSIS — Z992 Dependence on renal dialysis: Secondary | ICD-10-CM | POA: Diagnosis not present

## 2019-07-31 DIAGNOSIS — N186 End stage renal disease: Secondary | ICD-10-CM | POA: Diagnosis not present

## 2019-08-02 DIAGNOSIS — N186 End stage renal disease: Secondary | ICD-10-CM | POA: Diagnosis not present

## 2019-08-02 DIAGNOSIS — Z992 Dependence on renal dialysis: Secondary | ICD-10-CM | POA: Diagnosis not present

## 2019-08-02 DIAGNOSIS — N2581 Secondary hyperparathyroidism of renal origin: Secondary | ICD-10-CM | POA: Diagnosis not present

## 2019-08-03 DIAGNOSIS — B081 Molluscum contagiosum: Secondary | ICD-10-CM | POA: Diagnosis not present

## 2019-08-07 DIAGNOSIS — N2581 Secondary hyperparathyroidism of renal origin: Secondary | ICD-10-CM | POA: Diagnosis not present

## 2019-08-07 DIAGNOSIS — N186 End stage renal disease: Secondary | ICD-10-CM | POA: Diagnosis not present

## 2019-08-07 DIAGNOSIS — Z992 Dependence on renal dialysis: Secondary | ICD-10-CM | POA: Diagnosis not present

## 2019-08-09 DIAGNOSIS — Z992 Dependence on renal dialysis: Secondary | ICD-10-CM | POA: Diagnosis not present

## 2019-08-09 DIAGNOSIS — N186 End stage renal disease: Secondary | ICD-10-CM | POA: Diagnosis not present

## 2019-08-09 DIAGNOSIS — N2581 Secondary hyperparathyroidism of renal origin: Secondary | ICD-10-CM | POA: Diagnosis not present

## 2019-08-11 DIAGNOSIS — Z992 Dependence on renal dialysis: Secondary | ICD-10-CM | POA: Diagnosis not present

## 2019-08-11 DIAGNOSIS — N186 End stage renal disease: Secondary | ICD-10-CM | POA: Diagnosis not present

## 2019-08-11 DIAGNOSIS — N2581 Secondary hyperparathyroidism of renal origin: Secondary | ICD-10-CM | POA: Diagnosis not present

## 2019-08-14 DIAGNOSIS — N186 End stage renal disease: Secondary | ICD-10-CM | POA: Diagnosis not present

## 2019-08-14 DIAGNOSIS — N2581 Secondary hyperparathyroidism of renal origin: Secondary | ICD-10-CM | POA: Diagnosis not present

## 2019-08-14 DIAGNOSIS — Z992 Dependence on renal dialysis: Secondary | ICD-10-CM | POA: Diagnosis not present

## 2019-08-16 DIAGNOSIS — N2581 Secondary hyperparathyroidism of renal origin: Secondary | ICD-10-CM | POA: Diagnosis not present

## 2019-08-16 DIAGNOSIS — N186 End stage renal disease: Secondary | ICD-10-CM | POA: Diagnosis not present

## 2019-08-16 DIAGNOSIS — Z992 Dependence on renal dialysis: Secondary | ICD-10-CM | POA: Diagnosis not present

## 2019-08-18 DIAGNOSIS — Z992 Dependence on renal dialysis: Secondary | ICD-10-CM | POA: Diagnosis not present

## 2019-08-18 DIAGNOSIS — N2581 Secondary hyperparathyroidism of renal origin: Secondary | ICD-10-CM | POA: Diagnosis not present

## 2019-08-18 DIAGNOSIS — N186 End stage renal disease: Secondary | ICD-10-CM | POA: Diagnosis not present

## 2019-08-21 DIAGNOSIS — N2581 Secondary hyperparathyroidism of renal origin: Secondary | ICD-10-CM | POA: Diagnosis not present

## 2019-08-21 DIAGNOSIS — Z992 Dependence on renal dialysis: Secondary | ICD-10-CM | POA: Diagnosis not present

## 2019-08-21 DIAGNOSIS — N186 End stage renal disease: Secondary | ICD-10-CM | POA: Diagnosis not present

## 2019-08-23 DIAGNOSIS — E1122 Type 2 diabetes mellitus with diabetic chronic kidney disease: Secondary | ICD-10-CM | POA: Diagnosis not present

## 2019-08-23 DIAGNOSIS — Z992 Dependence on renal dialysis: Secondary | ICD-10-CM | POA: Diagnosis not present

## 2019-08-23 DIAGNOSIS — N186 End stage renal disease: Secondary | ICD-10-CM | POA: Diagnosis not present

## 2019-08-23 DIAGNOSIS — N2581 Secondary hyperparathyroidism of renal origin: Secondary | ICD-10-CM | POA: Diagnosis not present

## 2019-08-25 DIAGNOSIS — N186 End stage renal disease: Secondary | ICD-10-CM | POA: Diagnosis not present

## 2019-08-25 DIAGNOSIS — N2581 Secondary hyperparathyroidism of renal origin: Secondary | ICD-10-CM | POA: Diagnosis not present

## 2019-08-25 DIAGNOSIS — Z992 Dependence on renal dialysis: Secondary | ICD-10-CM | POA: Diagnosis not present

## 2019-08-28 DIAGNOSIS — Z992 Dependence on renal dialysis: Secondary | ICD-10-CM | POA: Diagnosis not present

## 2019-08-28 DIAGNOSIS — N2581 Secondary hyperparathyroidism of renal origin: Secondary | ICD-10-CM | POA: Diagnosis not present

## 2019-08-28 DIAGNOSIS — N186 End stage renal disease: Secondary | ICD-10-CM | POA: Diagnosis not present

## 2019-08-30 DIAGNOSIS — N2581 Secondary hyperparathyroidism of renal origin: Secondary | ICD-10-CM | POA: Diagnosis not present

## 2019-08-30 DIAGNOSIS — Z992 Dependence on renal dialysis: Secondary | ICD-10-CM | POA: Diagnosis not present

## 2019-08-30 DIAGNOSIS — N186 End stage renal disease: Secondary | ICD-10-CM | POA: Diagnosis not present

## 2019-09-01 DIAGNOSIS — N186 End stage renal disease: Secondary | ICD-10-CM | POA: Diagnosis not present

## 2019-09-01 DIAGNOSIS — N2581 Secondary hyperparathyroidism of renal origin: Secondary | ICD-10-CM | POA: Diagnosis not present

## 2019-09-01 DIAGNOSIS — Z992 Dependence on renal dialysis: Secondary | ICD-10-CM | POA: Diagnosis not present

## 2019-09-04 DIAGNOSIS — N186 End stage renal disease: Secondary | ICD-10-CM | POA: Diagnosis not present

## 2019-09-04 DIAGNOSIS — N2581 Secondary hyperparathyroidism of renal origin: Secondary | ICD-10-CM | POA: Diagnosis not present

## 2019-09-04 DIAGNOSIS — Z992 Dependence on renal dialysis: Secondary | ICD-10-CM | POA: Diagnosis not present

## 2019-09-05 ENCOUNTER — Encounter (INDEPENDENT_AMBULATORY_CARE_PROVIDER_SITE_OTHER): Payer: Medicare HMO

## 2019-09-05 ENCOUNTER — Other Ambulatory Visit (INDEPENDENT_AMBULATORY_CARE_PROVIDER_SITE_OTHER): Payer: Self-pay | Admitting: Vascular Surgery

## 2019-09-05 DIAGNOSIS — M79602 Pain in left arm: Secondary | ICD-10-CM

## 2019-09-05 DIAGNOSIS — T829XXA Unspecified complication of cardiac and vascular prosthetic device, implant and graft, initial encounter: Secondary | ICD-10-CM

## 2019-09-05 DIAGNOSIS — R200 Anesthesia of skin: Secondary | ICD-10-CM

## 2019-09-06 DIAGNOSIS — Z992 Dependence on renal dialysis: Secondary | ICD-10-CM | POA: Diagnosis not present

## 2019-09-06 DIAGNOSIS — N2581 Secondary hyperparathyroidism of renal origin: Secondary | ICD-10-CM | POA: Diagnosis not present

## 2019-09-06 DIAGNOSIS — N186 End stage renal disease: Secondary | ICD-10-CM | POA: Diagnosis not present

## 2019-09-07 ENCOUNTER — Ambulatory Visit (INDEPENDENT_AMBULATORY_CARE_PROVIDER_SITE_OTHER): Payer: Medicare HMO

## 2019-09-07 ENCOUNTER — Other Ambulatory Visit: Payer: Self-pay

## 2019-09-07 ENCOUNTER — Encounter (INDEPENDENT_AMBULATORY_CARE_PROVIDER_SITE_OTHER): Payer: Self-pay | Admitting: Vascular Surgery

## 2019-09-07 ENCOUNTER — Ambulatory Visit (INDEPENDENT_AMBULATORY_CARE_PROVIDER_SITE_OTHER): Payer: Medicare HMO | Admitting: Vascular Surgery

## 2019-09-07 VITALS — BP 163/73 | HR 67 | Ht 66.0 in | Wt 153.0 lb

## 2019-09-07 DIAGNOSIS — R200 Anesthesia of skin: Secondary | ICD-10-CM

## 2019-09-07 DIAGNOSIS — M79602 Pain in left arm: Secondary | ICD-10-CM

## 2019-09-07 DIAGNOSIS — E1122 Type 2 diabetes mellitus with diabetic chronic kidney disease: Secondary | ICD-10-CM | POA: Diagnosis not present

## 2019-09-07 DIAGNOSIS — T829XXA Unspecified complication of cardiac and vascular prosthetic device, implant and graft, initial encounter: Secondary | ICD-10-CM

## 2019-09-07 DIAGNOSIS — Z992 Dependence on renal dialysis: Secondary | ICD-10-CM | POA: Diagnosis not present

## 2019-09-07 DIAGNOSIS — I10 Essential (primary) hypertension: Secondary | ICD-10-CM

## 2019-09-07 DIAGNOSIS — I739 Peripheral vascular disease, unspecified: Secondary | ICD-10-CM | POA: Diagnosis not present

## 2019-09-07 DIAGNOSIS — Z794 Long term (current) use of insulin: Secondary | ICD-10-CM | POA: Diagnosis not present

## 2019-09-07 DIAGNOSIS — T829XXS Unspecified complication of cardiac and vascular prosthetic device, implant and graft, sequela: Secondary | ICD-10-CM | POA: Diagnosis not present

## 2019-09-07 DIAGNOSIS — N186 End stage renal disease: Secondary | ICD-10-CM

## 2019-09-07 DIAGNOSIS — E0849 Diabetes mellitus due to underlying condition with other diabetic neurological complication: Secondary | ICD-10-CM | POA: Diagnosis not present

## 2019-09-08 ENCOUNTER — Telehealth (INDEPENDENT_AMBULATORY_CARE_PROVIDER_SITE_OTHER): Payer: Self-pay

## 2019-09-08 DIAGNOSIS — N2581 Secondary hyperparathyroidism of renal origin: Secondary | ICD-10-CM | POA: Diagnosis not present

## 2019-09-08 DIAGNOSIS — N186 End stage renal disease: Secondary | ICD-10-CM | POA: Diagnosis not present

## 2019-09-08 DIAGNOSIS — Z992 Dependence on renal dialysis: Secondary | ICD-10-CM | POA: Diagnosis not present

## 2019-09-08 NOTE — Telephone Encounter (Signed)
I attempted to contact the patient's spouse and a message was left for a return call. 

## 2019-09-11 DIAGNOSIS — N186 End stage renal disease: Secondary | ICD-10-CM | POA: Diagnosis not present

## 2019-09-11 DIAGNOSIS — Z992 Dependence on renal dialysis: Secondary | ICD-10-CM | POA: Diagnosis not present

## 2019-09-11 DIAGNOSIS — N2581 Secondary hyperparathyroidism of renal origin: Secondary | ICD-10-CM | POA: Diagnosis not present

## 2019-09-12 ENCOUNTER — Ambulatory Visit: Payer: Medicare HMO | Admitting: Dermatology

## 2019-09-13 ENCOUNTER — Encounter (INDEPENDENT_AMBULATORY_CARE_PROVIDER_SITE_OTHER): Payer: Self-pay | Admitting: Vascular Surgery

## 2019-09-13 ENCOUNTER — Telehealth (INDEPENDENT_AMBULATORY_CARE_PROVIDER_SITE_OTHER): Payer: Self-pay

## 2019-09-13 DIAGNOSIS — N2581 Secondary hyperparathyroidism of renal origin: Secondary | ICD-10-CM | POA: Diagnosis not present

## 2019-09-13 DIAGNOSIS — Z992 Dependence on renal dialysis: Secondary | ICD-10-CM | POA: Diagnosis not present

## 2019-09-13 DIAGNOSIS — N186 End stage renal disease: Secondary | ICD-10-CM | POA: Diagnosis not present

## 2019-09-13 NOTE — Progress Notes (Signed)
MRN : [id number]  Joseph Mcpherson is a 74 y.o. (05/18/1946) male who presents with chief complaint of  Chief Complaint  Patient presents with  . Follow-up    Cold numbness during HD STEAL  .  History of Present Illness:   The patient returns to the office for follow up regarding problem with the dialysis access. Currently the patient is maintained via a left brachial axillary graft  The patient has had several failed upper extremity accesses.  The patient notes a significant increase in bleeding time after decannulation.  The patient has also been informed that there is increased recirculation.    The patient denies hand pain or other symptoms consistent with steal phenomena.  No significant arm swelling.  The patient denies redness or swelling at the access site. The patient denies fever or chills at home or while on dialysis.  The patient denies amaurosis fugax or recent TIA symptoms. There are no recent neurological changes noted. The patient denies claudication symptoms or rest pain symptoms. The patient denies history of DVT, PE or superficial thrombophlebitis. The patient denies recent episodes of angina or shortness of breath.      Current Meds  Medication Sig  . aspirin EC 325 MG EC tablet Take 1 tablet (325 mg total) by mouth daily.  . B Complex-C-Folic Acid (RENA-VITE RX) 1 MG TABS Take 1 tablet by mouth daily.   . Cholecalciferol (VITAMIN D3) 5000 units TABS Take 5,000 Units by mouth daily.   Marland Kitchen docusate sodium (COLACE) 100 MG capsule Take 100 mg by mouth 2 (two) times daily.   Marland Kitchen donepezil (ARICEPT) 10 MG tablet Take 10 mg by mouth at bedtime.   . gabapentin (NEURONTIN) 300 MG capsule Take 300 mg by mouth at bedtime.   . hydrALAZINE (APRESOLINE) 50 MG tablet Take 50 mg by mouth 3 (three) times daily.   . insulin aspart (NOVOLOG FLEXPEN) 100 UNIT/ML FlexPen Inject 8 Units into the skin 2 (two) times daily.   . insulin detemir (LEVEMIR) 100 UNIT/ML injection Inject 16 Units  into the skin at bedtime.   Marland Kitchen levothyroxine (SYNTHROID, LEVOTHROID) 137 MCG tablet Take 137 mcg by mouth daily before breakfast.  . losartan (COZAAR) 25 MG tablet Take 25 mg by mouth every evening.   . metoprolol succinate (TOPROL-XL) 50 MG 24 hr tablet Take 50 mg by mouth daily.   Marland Kitchen omeprazole (PRILOSEC) 40 MG capsule Take 40 mg by mouth daily.   . sevelamer carbonate (RENVELA) 2.4 g PACK Take 2.4 g by mouth 3 (three) times daily.  . simvastatin (ZOCOR) 20 MG tablet Take 20 mg by mouth every evening.   . sitaGLIPtin (JANUVIA) 50 MG tablet Take 50 mg by mouth every evening.     Past Medical History:  Diagnosis Date  . Chronic kidney disease   . Clotting disorder ([place])    per daughter, "when patient travels, his feet swell"  . Diabetes mellitus without complication ([place])   . GERD (gastroesophageal reflux disease)   . Hyperlipidemia   . Hypertension   . Hypothyroidism   . Thyroid disease     Past Surgical History:  Procedure Laterality Date  . AV FISTULA PLACEMENT Right 10/22/2017   Procedure: ARTERIOVENOUS (AV) BRACHIOCEPHALIC FISTULA CREATION RIGHT UPPER ARM;  Surgeon: Conrad Kalkaska, MD;  Location: [facility];  Service: Vascular;  Laterality: Right;  . AV FISTULA PLACEMENT Left 02/03/2019   Procedure: INSERTION OF ARTERIOVENOUS (AV) GORE-TEX GRAFT ARM (BRACHIAL AXILLARY );  Surgeon: [name],  MD;  Location: [facility];  Service: Vascular;  Laterality: Left;    Social History Social History   Tobacco Use  . Smoking status: Never Smoker  . Smokeless tobacco: Former Systems developer    Types: Snuff  Substance Use Topics  . Alcohol use: No  . Drug use: No    Family History Family History  Problem Relation Age of Onset  . Diabetes Mother   . Hyperlipidemia Mother   . Hypertension Father     No Known Allergies   REVIEW OF SYSTEMS (Negative unless checked)  Constitutional: [] Weight loss  [] Fever  [] Chills Cardiac: [] Chest pain   [] Chest pressure   [] Palpitations   [] Shortness  of breath when laying flat   [] Shortness of breath with exertion. Vascular:  [] Pain in legs with walking   [] Pain in legs at rest  [] History of DVT   [] Phlebitis   [] Swelling in legs   [] Varicose veins   [] Non-healing ulcers Pulmonary:   [] Uses home oxygen   [] Productive cough   [] Hemoptysis   [] Wheeze  [] COPD   [] Asthma Neurologic:  [] Dizziness   [] Seizures   [] History of stroke   [] History of TIA  [] Aphasia   [] Vissual changes   [] Weakness or numbness in arm   [] Weakness or numbness in leg Musculoskeletal:   [] Joint swelling   [] Joint pain   [] Low back pain Hematologic:  [] Easy bruising  [] Easy bleeding   [] Hypercoagulable state   [] Anemic Gastrointestinal:  [] Diarrhea   [] Vomiting  [] Gastroesophageal reflux/heartburn   [] Difficulty swallowing. Genitourinary:  [x] Chronic kidney disease   [] Difficult urination  [] Frequent urination   [] Blood in urine Skin:  [] Rashes   [] Ulcers  Psychological:  [] History of anxiety   []  History of major depression.  Physical Examination  Vitals:   09/07/19 0848  BP: (!) 163/73  Pulse: 67  Weight: 153 lb (69.4 kg)  Height: 5\' 6"  (1.676 m)   Body mass index is 24.69 kg/m. Gen: WD/WN, NAD Head: [place]/AT, No temporalis wasting.  Ear/Nose/Throat: Hearing grossly intact, nares w/o erythema or drainage Eyes: PER, EOMI, sclera nonicteric.  Neck: Supple, no large masses.   Pulmonary:  Good air movement, no audible wheezing bilaterally, no use of accessory muscles.  Cardiac: RRR, no JVD Vascular:  Left brachial axillary graft good thrill good bruit Vessel Right Left  Radial Palpable Not Palpable  Ulnar Palpable Not Palpable  Brachial Palpable Weakly Palpable  Gastrointestinal: Non-distended. No guarding/no peritoneal signs.  Musculoskeletal: M/S 5/5 throughout.  No deformity or atrophy.  Neurologic: CN 2-12 intact. Symmetrical.  Speech is fluent. Motor exam as listed above. Psychiatric: Judgment intact, Mood & affect appropriate for pt's clinical  situation. Dermatologic: No rashes or ulcers noted.  No changes consistent with cellulitis.  CBC Lab Results  Component Value Date   WBC 11.8 (H) 05/04/2019   HGB 10.8 (L) 05/04/2019   HCT 33.2 (L) 05/04/2019   MCV 95.1 05/04/2019   PLT 236 05/04/2019    BMET    Component Value Date/Time   NA 136 05/04/2019 1413   NA 139 09/14/2013 0534   K 5.2 (H) 05/04/2019 1413   K 4.3 09/14/2013 0534   CL 95 (L) 05/04/2019 1413   CL 108 (H) 09/14/2013 0534   CO2 26 05/04/2019 1413   CO2 26 09/14/2013 0534   GLUCOSE 190 (H) 05/04/2019 1413   GLUCOSE 146 (H) 09/14/2013 0534   BUN 35 (H) 05/04/2019 1413   BUN 17 09/14/2013 0534   CREATININE 7.68 (H) 05/04/2019 2135  CREATININE 1.71 (H) 09/14/2013 0534   CALCIUM 9.0 05/04/2019 1413   CALCIUM 8.7 09/14/2013 0534   GFRNONAA 6 (L) 05/04/2019 2135   GFRNONAA 41 (L) 09/14/2013 0534   GFRAA 7 (L) 05/04/2019 2135   GFRAA 47 (L) 09/14/2013 0534   CrCl cannot be calculated (Patient's most recent lab result is older than the maximum 21 days allowed.).  COAG Lab Results  Component Value Date   INR 0.9 01/26/2019    Radiology No results found.   Assessment/Plan 1. Complication of vascular access for dialysis, sequela Recommend:  The patient has evidence of left hand pain that is associated with preulcerative changes and impending tissue loss of the fingers.  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  This represents a limb threatening ischemia and places the patient at the risk for limb loss.  Patient should undergo angiography of the left upper extremity with the hope for intervention for limb salvage.  The risks and benefits as well as the alternative therapies was discussed in detail with the patient.  All questions were answered.  Patient agrees to proceed with left upper extremity angiography.  The patient will follow up with me in the office after the procedure.    2. End stage renal disease ([place]) At the  present time the patient has adequate dialysis access although his steal symptoms will need to be addressed.  Continue hemodialysis as ordered without interruption.  Avoid nephrotoxic medications and dehydration.  Further plans per nephrology  3. Peripheral vascular disease ([place]) This could be contributing to his steal angiography will be performed to ensure there is no proximal lesion that could be treated if not then banding  4. Essential hypertension Continue antihypertensive medications as already ordered, these medications have been reviewed and there are no changes at this time.   5. 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  Hgb A1C to be monitored as already arranged by primary service    Hortencia Pilar, MD  09/13/2019 12:52 PM

## 2019-09-13 NOTE — Telephone Encounter (Signed)
Spoke with the patient's wife and the patient is now scheduled for his left arm angio with Dr. Delana Meyer on 09/19/19 with a 10:00 am arrival time to the [facility]. Patient will do covid testing on 09/15/19 between 8-1 pm at the [facility]. I attempted to discuss pre-procedure instructions but was cut off. Instructions will be mailed to the patient.

## 2019-09-14 ENCOUNTER — Other Ambulatory Visit
Admission: RE | Admit: 2019-09-14 | Discharge: 2019-09-14 | Disposition: A | Discharge status: Home / Self Care | Payer: Medicare HMO | Source: Ambulatory Visit | Attending: Vascular Surgery | Admitting: Vascular Surgery

## 2019-09-14 ENCOUNTER — Other Ambulatory Visit: Payer: Self-pay

## 2019-09-14 DIAGNOSIS — G301 Alzheimer's disease with late onset: Secondary | ICD-10-CM | POA: Diagnosis not present

## 2019-09-14 DIAGNOSIS — F0280 Dementia in other diseases classified elsewhere without behavioral disturbance: Secondary | ICD-10-CM | POA: Diagnosis not present

## 2019-09-14 DIAGNOSIS — Z01812 Encounter for preprocedural laboratory examination: Secondary | ICD-10-CM | POA: Insufficient documentation

## 2019-09-14 DIAGNOSIS — Z20822 Contact with and (suspected) exposure to covid-19: Secondary | ICD-10-CM | POA: Diagnosis not present

## 2019-09-14 DIAGNOSIS — G939 Disorder of brain, unspecified: Secondary | ICD-10-CM | POA: Diagnosis not present

## 2019-09-14 DIAGNOSIS — E1142 Type 2 diabetes mellitus with diabetic polyneuropathy: Secondary | ICD-10-CM | POA: Diagnosis not present

## 2019-09-14 DIAGNOSIS — R200 Anesthesia of skin: Secondary | ICD-10-CM | POA: Diagnosis not present

## 2019-09-14 DIAGNOSIS — I6381 Other cerebral infarction due to occlusion or stenosis of small artery: Secondary | ICD-10-CM | POA: Diagnosis not present

## 2019-09-14 DIAGNOSIS — R202 Paresthesia of skin: Secondary | ICD-10-CM | POA: Diagnosis not present

## 2019-09-14 LAB — SARS CORONAVIRUS 2 (TAT 6-24 HRS): SARS Coronavirus 2: NEGATIVE

## 2019-09-15 ENCOUNTER — Other Ambulatory Visit: Admission: RE | Admit: 2019-09-15 | Payer: Medicare HMO | Source: Ambulatory Visit

## 2019-09-15 DIAGNOSIS — N186 End stage renal disease: Secondary | ICD-10-CM | POA: Diagnosis not present

## 2019-09-15 DIAGNOSIS — Z992 Dependence on renal dialysis: Secondary | ICD-10-CM | POA: Diagnosis not present

## 2019-09-15 DIAGNOSIS — N2581 Secondary hyperparathyroidism of renal origin: Secondary | ICD-10-CM | POA: Diagnosis not present

## 2019-09-18 ENCOUNTER — Other Ambulatory Visit (INDEPENDENT_AMBULATORY_CARE_PROVIDER_SITE_OTHER): Payer: Self-pay | Admitting: Nurse Practitioner

## 2019-09-18 DIAGNOSIS — Z992 Dependence on renal dialysis: Secondary | ICD-10-CM | POA: Diagnosis not present

## 2019-09-18 DIAGNOSIS — N186 End stage renal disease: Secondary | ICD-10-CM | POA: Diagnosis not present

## 2019-09-18 DIAGNOSIS — N2581 Secondary hyperparathyroidism of renal origin: Secondary | ICD-10-CM | POA: Diagnosis not present

## 2019-09-18 MED ORDER — CEFAZOLIN SODIUM-DEXTROSE 1-4 GM/50ML-% IV SOLN: 1.0000 g | Freq: Once | INTRAVENOUS | Status: AC

## 2019-09-19 ENCOUNTER — Other Ambulatory Visit: Payer: Self-pay

## 2019-09-19 ENCOUNTER — Ambulatory Visit
Admission: RE | Admit: 2019-09-19 | Discharge: 2019-09-19 | Disposition: A | Discharge status: Home / Self Care | Payer: Medicare HMO | Attending: Vascular Surgery | Admitting: Vascular Surgery

## 2019-09-19 ENCOUNTER — Encounter
Admission: RE | Disposition: A | Discharge status: Home / Self Care | Payer: Self-pay | Source: Home / Self Care | Attending: Vascular Surgery

## 2019-09-19 DIAGNOSIS — Z8249 Family history of ischemic heart disease and other diseases of the circulatory system: Secondary | ICD-10-CM | POA: Insufficient documentation

## 2019-09-19 DIAGNOSIS — Z79890 Hormone replacement therapy (postmenopausal): Secondary | ICD-10-CM | POA: Insufficient documentation

## 2019-09-19 DIAGNOSIS — E079 Disorder of thyroid, unspecified: Secondary | ICD-10-CM | POA: Diagnosis not present

## 2019-09-19 DIAGNOSIS — T82898A Other specified complication of vascular prosthetic devices, implants and grafts, initial encounter: Secondary | ICD-10-CM | POA: Diagnosis not present

## 2019-09-19 DIAGNOSIS — Z95828 Presence of other vascular implants and grafts: Secondary | ICD-10-CM | POA: Diagnosis not present

## 2019-09-19 DIAGNOSIS — I771 Stricture of artery: Secondary | ICD-10-CM | POA: Diagnosis not present

## 2019-09-19 DIAGNOSIS — E785 Hyperlipidemia, unspecified: Secondary | ICD-10-CM | POA: Insufficient documentation

## 2019-09-19 DIAGNOSIS — Z794 Long term (current) use of insulin: Secondary | ICD-10-CM | POA: Insufficient documentation

## 2019-09-19 DIAGNOSIS — M79642 Pain in left hand: Secondary | ICD-10-CM | POA: Insufficient documentation

## 2019-09-19 DIAGNOSIS — Z79899 Other long term (current) drug therapy: Secondary | ICD-10-CM | POA: Diagnosis not present

## 2019-09-19 DIAGNOSIS — N186 End stage renal disease: Secondary | ICD-10-CM

## 2019-09-19 DIAGNOSIS — E1122 Type 2 diabetes mellitus with diabetic chronic kidney disease: Secondary | ICD-10-CM | POA: Insufficient documentation

## 2019-09-19 DIAGNOSIS — Z7982 Long term (current) use of aspirin: Secondary | ICD-10-CM | POA: Diagnosis not present

## 2019-09-19 DIAGNOSIS — I70298 Other atherosclerosis of native arteries of extremities, other extremity: Secondary | ICD-10-CM | POA: Diagnosis not present

## 2019-09-19 DIAGNOSIS — Z87891 Personal history of nicotine dependence: Secondary | ICD-10-CM | POA: Diagnosis not present

## 2019-09-19 DIAGNOSIS — Z833 Family history of diabetes mellitus: Secondary | ICD-10-CM | POA: Diagnosis not present

## 2019-09-19 DIAGNOSIS — I120 Hypertensive chronic kidney disease with stage 5 chronic kidney disease or end stage renal disease: Secondary | ICD-10-CM | POA: Diagnosis not present

## 2019-09-19 DIAGNOSIS — Y832 Surgical operation with anastomosis, bypass or graft as the cause of abnormal reaction of the patient, or of later complication, without mention of misadventure at the time of the procedure: Secondary | ICD-10-CM | POA: Diagnosis not present

## 2019-09-19 DIAGNOSIS — Z992 Dependence on renal dialysis: Secondary | ICD-10-CM | POA: Diagnosis not present

## 2019-09-19 DIAGNOSIS — K219 Gastro-esophageal reflux disease without esophagitis: Secondary | ICD-10-CM | POA: Insufficient documentation

## 2019-09-19 HISTORY — PX: UPPER EXTREMITY ANGIOGRAPHY: CATH118270

## 2019-09-19 LAB — GLUCOSE, CAPILLARY
Glucose-Capillary: 106 mg/dL — ABNORMAL HIGH (ref 70–99)
Glucose-Capillary: 106 mg/dL — ABNORMAL HIGH (ref 70–99)

## 2019-09-19 LAB — POTASSIUM (ARMC VASCULAR LAB ONLY): Potassium (ARMC vascular lab): 4.4 (ref 3.5–5.1)

## 2019-09-19 SURGERY — UPPER EXTREMITY ANGIOGRAPHY
Anesthesia: Moderate Sedation | Laterality: Left

## 2019-09-19 MED ORDER — FENTANYL CITRATE (PF) 100 MCG/2ML IJ SOLN
INTRAMUSCULAR | Status: DC | PRN
  Administered 2019-09-19: 50 ug via INTRAVENOUS
  Administered 2019-09-19: 25 ug via INTRAVENOUS

## 2019-09-19 MED ORDER — SODIUM CHLORIDE 0.9 % IV SOLN: 250.0000 mL | INTRAVENOUS | Status: DC | PRN

## 2019-09-19 MED ORDER — SODIUM CHLORIDE 0.9 % IV SOLN: INTRAVENOUS | Status: DC

## 2019-09-19 MED ORDER — SODIUM CHLORIDE 0.9% FLUSH: 3.0000 mL | Freq: Two times a day (BID) | INTRAVENOUS | Status: DC

## 2019-09-19 MED ORDER — HEPARIN SODIUM (PORCINE) 1000 UNIT/ML IJ SOLN
INTRAMUSCULAR | Status: DC | PRN
  Administered 2019-09-19: 4000 [IU] via INTRAVENOUS

## 2019-09-19 MED ORDER — ONDANSETRON HCL 4 MG/2ML IJ SOLN: 4.0000 mg | Freq: Four times a day (QID) | INTRAMUSCULAR | Status: DC | PRN

## 2019-09-19 MED ORDER — NITROGLYCERIN 1 MG/10 ML FOR IR/CATH LAB
INTRA_ARTERIAL | Status: DC | PRN
  Administered 2019-09-19: 400 ug

## 2019-09-19 MED ORDER — MIDAZOLAM HCL 5 MG/5ML IJ SOLN
INTRAMUSCULAR | Status: AC
  Filled 2019-09-19: qty 5

## 2019-09-19 MED ORDER — DIPHENHYDRAMINE HCL 50 MG/ML IJ SOLN: 50.0000 mg | Freq: Once | INTRAMUSCULAR | Status: DC | PRN

## 2019-09-19 MED ORDER — HYDRALAZINE HCL 20 MG/ML IJ SOLN: 5.0000 mg | INTRAMUSCULAR | Status: DC | PRN

## 2019-09-19 MED ORDER — MIDAZOLAM HCL 2 MG/ML PO SYRP: 6.0000 mg | ORAL_SOLUTION | Freq: Once | ORAL | Status: DC

## 2019-09-19 MED ORDER — MIDAZOLAM HCL 2 MG/ML PO SYRP: 8.0000 mg | ORAL_SOLUTION | Freq: Once | ORAL | Status: DC | PRN

## 2019-09-19 MED ORDER — LABETALOL HCL 5 MG/ML IV SOLN: 10.0000 mg | INTRAVENOUS | Status: DC | PRN

## 2019-09-19 MED ORDER — CEFAZOLIN SODIUM-DEXTROSE 1-4 GM/50ML-% IV SOLN
INTRAVENOUS | Status: AC
  Administered 2019-09-19: 1 g via INTRAVENOUS
  Filled 2019-09-19: qty 50

## 2019-09-19 MED ORDER — HEPARIN SODIUM (PORCINE) 1000 UNIT/ML IJ SOLN
INTRAMUSCULAR | Status: AC
  Filled 2019-09-19: qty 1

## 2019-09-19 MED ORDER — SODIUM CHLORIDE 0.9% FLUSH: 3.0000 mL | INTRAVENOUS | Status: DC | PRN

## 2019-09-19 MED ORDER — METHYLPREDNISOLONE SODIUM SUCC 125 MG IJ SOLR: 125.0000 mg | Freq: Once | INTRAMUSCULAR | Status: DC | PRN

## 2019-09-19 MED ORDER — OXYCODONE HCL 5 MG PO TABS: 5.0000 mg | ORAL_TABLET | ORAL | Status: DC | PRN

## 2019-09-19 MED ORDER — MORPHINE SULFATE (PF) 4 MG/ML IV SOLN: 2.0000 mg | INTRAVENOUS | Status: DC | PRN

## 2019-09-19 MED ORDER — FAMOTIDINE 20 MG PO TABS: 40.0000 mg | ORAL_TABLET | Freq: Once | ORAL | Status: DC | PRN

## 2019-09-19 MED ORDER — MIDAZOLAM HCL 2 MG/2ML IJ SOLN
INTRAMUSCULAR | Status: DC | PRN
  Administered 2019-09-19: 0.5 mg via INTRAVENOUS
  Administered 2019-09-19: 2 mg via INTRAVENOUS

## 2019-09-19 MED ORDER — ACETAMINOPHEN 325 MG PO TABS: 650.0000 mg | ORAL_TABLET | ORAL | Status: DC | PRN

## 2019-09-19 MED ORDER — FENTANYL CITRATE (PF) 100 MCG/2ML IJ SOLN
INTRAMUSCULAR | Status: AC
  Filled 2019-09-19: qty 2

## 2019-09-19 MED ORDER — HYDROMORPHONE HCL 1 MG/ML IJ SOLN: 1.0000 mg | Freq: Once | INTRAMUSCULAR | Status: DC | PRN

## 2019-09-19 SURGICAL SUPPLY — 19 items
BALLN ULTRVRSE 2X300X150 (BALLOONS) ×2
BALLOON ULTRVRSE 2X300X150 (BALLOONS) ×1 IMPLANT
CATH ANGIO 5F 100CM .035 PIG (CATHETERS) ×2 IMPLANT
CATH BEACON 5 .035 100 H1 TIP (CATHETERS) ×2 IMPLANT
CATH SEEKER .035X150CM (CATHETERS) ×2 IMPLANT
COVER PROBE U/S 5X48 (MISCELLANEOUS) ×2 IMPLANT
DEVICE PRESTO INFLATION (MISCELLANEOUS) ×2 IMPLANT
DEVICE STARCLOSE SE CLOSURE (Vascular Products) ×2 IMPLANT
DEVICE TORQUE .025-.038 (MISCELLANEOUS) ×2 IMPLANT
GLIDEWIRE ANGLED SS 035X260CM (WIRE) ×2 IMPLANT
GUIDEWIRE STR TIP .014X300X8 (WIRE) ×2 IMPLANT
NEEDLE ENTRY 21GA 7CM ECHOTIP (NEEDLE) ×2 IMPLANT
PACK ANGIOGRAPHY (CUSTOM PROCEDURE TRAY) ×2 IMPLANT
SET INTRO CAPELLA COAXIAL (SET/KITS/TRAYS/PACK) ×2 IMPLANT
SHEATH BRITE TIP 5FRX11 (SHEATH) ×2 IMPLANT
SHEATH SHUTTLE SELECT 6F (SHEATH) ×2 IMPLANT
TUBING CONTRAST HIGH PRESS 72 (TUBING) ×2 IMPLANT
VALVE CHECKFLO PERFORMER (SHEATH) ×2 IMPLANT
WIRE J 3MM .035X145CM (WIRE) ×2 IMPLANT

## 2019-09-19 NOTE — Op Note (Signed)
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  Arch aortogram  2.  Left upper extremity angiography third order catheter placement with additional third order  3.  Percutaneous transluminal angioplasty of the left ulnar artery to 2 mm  4.  Star close right common femoral   Anesthesia: Conscious sedation was administered by the interventional radiology RN under my direct supervision. IV Versed plus fentanyl were utilized. Continuous ECG, pulse oximetry and blood pressure was monitored throughout the entire procedure. Conscious sedation was administered for a total of 70 minutes.  Sheath: 6 French 90 cm shuttle sheath right common femoral artery retrograde  Contrast: 55 cc   Fluoroscopy Time: 5.7 minutes  Indications: Patient presented to the office for his routine follow-up evaluation noting increasing hand pain on the left.  Duplex ultrasound of the access demonstrated a high flow volume.  Concern for steal syndrome is now raised.  Angiography of the left upper extremity was recommended to correct any flow-limiting lesions.  Risks and benefits were reviewed all questions were answered patient has agreed to proceed.  He understands that if intervention at the time of angiography does not improve his symptoms he may need surgical banding of his graft.  Procedure:  Joseph Mcpherson a 74 y.o. male who was identified and appropriate procedural time out was performed.  The patient was then placed supine on the table and prepped and draped in the usual sterile fashion.  Ultrasound was used to evaluate the right common femoral artery.  It was echolucent  and pulsatile indicating it is patent .  An ultrasound image was acquired for the permanent record.  A micropuncture needle was used to access the right common femoral artery under direct ultrasound guidance.  The microwire was then advanced under fluoroscopic guidance without difficulty followed by the micro-sheath.  A 0.035 J wire was advanced without resistance and a 5Fr sheath was placed.    Pigtail catheter J-wire then advanced into the ascending aorta and an LAO projection of the arch was obtained.  Stiff angled Glidewire was reintroduced through the pigtail and the pigtail exchanged for an H1 catheter.  Subclavian was then selected and the wire catheter combination were advanced taking pictures with hand-injection as we moved more distally.    The aortic arch is widely patent.  Bovine arch anatomy is noted.  There are no ostial stenoses noted to the great vessels.  The left subclavian is widely patent as is the axillary artery and the brachial artery.  The brachial axillary graft is widely patent with rapid flow of contrast.  There is some contrast moving distally beyond the anastomosis of the AV graft when contrast is injected proximally but it is clearly a diminished volume.  This is consistent with moderate steal syndrome.  The trifurcation anatomy was then evaluated.  At this point imaging of the trifurcation was needed we switched from the H1 catheter to a 135 cm seeker catheter.  Glidewire was reintroduced and the seeker was advanced into the radial artery this represents the initial third order catheter placement.  Hand-injection of contrast demonstrated diffuse disease but there were no hemodynamically significant lesions.  The palmar arch is patent but it is diminutive beginning at the base of the third digit.  The seeker catheter and wire were then renegotiated into the ulnar artery representing additional third order catheter placement.  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  Even with this severe disease the ulnar artery is primary source filling the arteries of the fourth and fifth digits.  Based on this finding I elected to move forward with angioplasty of the ulnar artery.  4000 units of heparin was given and the 5 French Pinnacle sheath was exchanged for a 90 cm 6 French shuttle sheath.  Shuttle sheath was positioned in the distal brachial artery seeker catheter was readvanced into the ulnar artery and the 035 wire exchanged for a V 14 wire which was negotiated out to the base of the fourth digit.  A 2 mm x 30 cm ultra versed balloon was then advanced across the lesion inflation was to 10 atm for 1 minute.  Follow-up imaging now demonstrated some spasm in both the radial and ulnar arteries.  He was given 400 mcg of nitroglycerin and imaging repeated which now showed wide patency of the ulnar artery with less than 10% residual stenosis throughout its course.  The radial artery is preserved without change.  Having successfully treated the distal disease I elected to terminate the procedure the sheath was pulled back into the right external iliac artery oblique view was obtained a Star close device deployed.  There were no immediate complications.  Disposition: Patient was taken to the recovery room in stable condition having tolerated the procedure well.    09/19/2019,5:39 PM 

## 2019-09-19 NOTE — H&P (Signed)
[facility] History & Physical Update  The patient was interviewed and re-examined.  The patient's previous History and Physical has been reviewed and is unchanged.  There is no change in the plan of care. We plan to proceed with the scheduled procedure.  Hortencia Pilar, MD  09/19/2019, 10:44 AM

## 2019-09-20 ENCOUNTER — Encounter: Payer: Self-pay | Admitting: Cardiology

## 2019-09-20 DIAGNOSIS — N186 End stage renal disease: Secondary | ICD-10-CM | POA: Diagnosis not present

## 2019-09-20 DIAGNOSIS — N2581 Secondary hyperparathyroidism of renal origin: Secondary | ICD-10-CM | POA: Diagnosis not present

## 2019-09-20 DIAGNOSIS — Z992 Dependence on renal dialysis: Secondary | ICD-10-CM | POA: Diagnosis not present

## 2019-09-22 DIAGNOSIS — N186 End stage renal disease: Secondary | ICD-10-CM | POA: Diagnosis not present

## 2019-09-22 DIAGNOSIS — E1122 Type 2 diabetes mellitus with diabetic chronic kidney disease: Secondary | ICD-10-CM | POA: Diagnosis not present

## 2019-09-22 DIAGNOSIS — N2581 Secondary hyperparathyroidism of renal origin: Secondary | ICD-10-CM | POA: Diagnosis not present

## 2019-09-22 DIAGNOSIS — Z992 Dependence on renal dialysis: Secondary | ICD-10-CM | POA: Diagnosis not present

## 2019-09-25 DIAGNOSIS — N186 End stage renal disease: Secondary | ICD-10-CM | POA: Diagnosis not present

## 2019-09-25 DIAGNOSIS — Z992 Dependence on renal dialysis: Secondary | ICD-10-CM | POA: Diagnosis not present

## 2019-09-25 DIAGNOSIS — N2581 Secondary hyperparathyroidism of renal origin: Secondary | ICD-10-CM | POA: Diagnosis not present

## 2019-09-27 DIAGNOSIS — Z992 Dependence on renal dialysis: Secondary | ICD-10-CM | POA: Diagnosis not present

## 2019-09-27 DIAGNOSIS — N186 End stage renal disease: Secondary | ICD-10-CM | POA: Diagnosis not present

## 2019-09-27 DIAGNOSIS — N2581 Secondary hyperparathyroidism of renal origin: Secondary | ICD-10-CM | POA: Diagnosis not present

## 2019-09-29 DIAGNOSIS — N2581 Secondary hyperparathyroidism of renal origin: Secondary | ICD-10-CM | POA: Diagnosis not present

## 2019-09-29 DIAGNOSIS — Z992 Dependence on renal dialysis: Secondary | ICD-10-CM | POA: Diagnosis not present

## 2019-09-29 DIAGNOSIS — N186 End stage renal disease: Secondary | ICD-10-CM | POA: Diagnosis not present

## 2019-10-02 DIAGNOSIS — N186 End stage renal disease: Secondary | ICD-10-CM | POA: Diagnosis not present

## 2019-10-02 DIAGNOSIS — Z992 Dependence on renal dialysis: Secondary | ICD-10-CM | POA: Diagnosis not present

## 2019-10-02 DIAGNOSIS — N2581 Secondary hyperparathyroidism of renal origin: Secondary | ICD-10-CM | POA: Diagnosis not present

## 2019-10-04 DIAGNOSIS — N186 End stage renal disease: Secondary | ICD-10-CM | POA: Diagnosis not present

## 2019-10-04 DIAGNOSIS — Z992 Dependence on renal dialysis: Secondary | ICD-10-CM | POA: Diagnosis not present

## 2019-10-04 DIAGNOSIS — N2581 Secondary hyperparathyroidism of renal origin: Secondary | ICD-10-CM | POA: Diagnosis not present

## 2019-10-06 DIAGNOSIS — N2581 Secondary hyperparathyroidism of renal origin: Secondary | ICD-10-CM | POA: Diagnosis not present

## 2019-10-06 DIAGNOSIS — N186 End stage renal disease: Secondary | ICD-10-CM | POA: Diagnosis not present

## 2019-10-06 DIAGNOSIS — Z992 Dependence on renal dialysis: Secondary | ICD-10-CM | POA: Diagnosis not present

## 2019-10-09 DIAGNOSIS — N186 End stage renal disease: Secondary | ICD-10-CM | POA: Diagnosis not present

## 2019-10-09 DIAGNOSIS — Z992 Dependence on renal dialysis: Secondary | ICD-10-CM | POA: Diagnosis not present

## 2019-10-09 DIAGNOSIS — N2581 Secondary hyperparathyroidism of renal origin: Secondary | ICD-10-CM | POA: Diagnosis not present

## 2019-10-11 DIAGNOSIS — N2581 Secondary hyperparathyroidism of renal origin: Secondary | ICD-10-CM | POA: Diagnosis not present

## 2019-10-11 DIAGNOSIS — N186 End stage renal disease: Secondary | ICD-10-CM | POA: Diagnosis not present

## 2019-10-11 DIAGNOSIS — Z992 Dependence on renal dialysis: Secondary | ICD-10-CM | POA: Diagnosis not present

## 2019-10-13 DIAGNOSIS — Z992 Dependence on renal dialysis: Secondary | ICD-10-CM | POA: Diagnosis not present

## 2019-10-13 DIAGNOSIS — N186 End stage renal disease: Secondary | ICD-10-CM | POA: Diagnosis not present

## 2019-10-13 DIAGNOSIS — N2581 Secondary hyperparathyroidism of renal origin: Secondary | ICD-10-CM | POA: Diagnosis not present

## 2019-10-16 ENCOUNTER — Other Ambulatory Visit: Payer: Self-pay | Admitting: *Deleted

## 2019-10-16 DIAGNOSIS — N186 End stage renal disease: Secondary | ICD-10-CM | POA: Diagnosis not present

## 2019-10-16 DIAGNOSIS — N2581 Secondary hyperparathyroidism of renal origin: Secondary | ICD-10-CM | POA: Diagnosis not present

## 2019-10-16 DIAGNOSIS — Z992 Dependence on renal dialysis: Secondary | ICD-10-CM | POA: Diagnosis not present

## 2019-10-16 MED ORDER — HYDRALAZINE HCL 50 MG PO TABS: 50.0000 mg | ORAL_TABLET | Freq: Three times a day (TID) | ORAL | 6 refills | Status: DC

## 2019-10-18 DIAGNOSIS — N186 End stage renal disease: Secondary | ICD-10-CM | POA: Diagnosis not present

## 2019-10-18 DIAGNOSIS — N2581 Secondary hyperparathyroidism of renal origin: Secondary | ICD-10-CM | POA: Diagnosis not present

## 2019-10-18 DIAGNOSIS — Z992 Dependence on renal dialysis: Secondary | ICD-10-CM | POA: Diagnosis not present

## 2019-10-20 DIAGNOSIS — N186 End stage renal disease: Secondary | ICD-10-CM | POA: Diagnosis not present

## 2019-10-20 DIAGNOSIS — Z992 Dependence on renal dialysis: Secondary | ICD-10-CM | POA: Diagnosis not present

## 2019-10-20 DIAGNOSIS — N2581 Secondary hyperparathyroidism of renal origin: Secondary | ICD-10-CM | POA: Diagnosis not present

## 2019-10-23 DIAGNOSIS — E1122 Type 2 diabetes mellitus with diabetic chronic kidney disease: Secondary | ICD-10-CM | POA: Diagnosis not present

## 2019-10-23 DIAGNOSIS — Z992 Dependence on renal dialysis: Secondary | ICD-10-CM | POA: Diagnosis not present

## 2019-10-23 DIAGNOSIS — N2581 Secondary hyperparathyroidism of renal origin: Secondary | ICD-10-CM | POA: Diagnosis not present

## 2019-10-23 DIAGNOSIS — N186 End stage renal disease: Secondary | ICD-10-CM | POA: Diagnosis not present

## 2019-10-25 DIAGNOSIS — Z992 Dependence on renal dialysis: Secondary | ICD-10-CM | POA: Diagnosis not present

## 2019-10-25 DIAGNOSIS — N2581 Secondary hyperparathyroidism of renal origin: Secondary | ICD-10-CM | POA: Diagnosis not present

## 2019-10-25 DIAGNOSIS — N186 End stage renal disease: Secondary | ICD-10-CM | POA: Diagnosis not present

## 2019-10-27 DIAGNOSIS — N2581 Secondary hyperparathyroidism of renal origin: Secondary | ICD-10-CM | POA: Diagnosis not present

## 2019-10-27 DIAGNOSIS — Z992 Dependence on renal dialysis: Secondary | ICD-10-CM | POA: Diagnosis not present

## 2019-10-27 DIAGNOSIS — N186 End stage renal disease: Secondary | ICD-10-CM | POA: Diagnosis not present

## 2019-10-30 DIAGNOSIS — Z992 Dependence on renal dialysis: Secondary | ICD-10-CM | POA: Diagnosis not present

## 2019-10-30 DIAGNOSIS — N186 End stage renal disease: Secondary | ICD-10-CM | POA: Diagnosis not present

## 2019-10-30 DIAGNOSIS — N2581 Secondary hyperparathyroidism of renal origin: Secondary | ICD-10-CM | POA: Diagnosis not present

## 2019-11-01 ENCOUNTER — Other Ambulatory Visit (INDEPENDENT_AMBULATORY_CARE_PROVIDER_SITE_OTHER): Payer: Self-pay | Admitting: Nurse Practitioner

## 2019-11-01 ENCOUNTER — Other Ambulatory Visit (INDEPENDENT_AMBULATORY_CARE_PROVIDER_SITE_OTHER): Payer: Self-pay | Admitting: Vascular Surgery

## 2019-11-01 DIAGNOSIS — Z9862 Peripheral vascular angioplasty status: Secondary | ICD-10-CM

## 2019-11-01 DIAGNOSIS — Z992 Dependence on renal dialysis: Secondary | ICD-10-CM | POA: Diagnosis not present

## 2019-11-01 DIAGNOSIS — N186 End stage renal disease: Secondary | ICD-10-CM | POA: Diagnosis not present

## 2019-11-01 DIAGNOSIS — N2581 Secondary hyperparathyroidism of renal origin: Secondary | ICD-10-CM | POA: Diagnosis not present

## 2019-11-02 ENCOUNTER — Ambulatory Visit (INDEPENDENT_AMBULATORY_CARE_PROVIDER_SITE_OTHER): Payer: Medicare HMO | Admitting: Vascular Surgery

## 2019-11-02 ENCOUNTER — Encounter (INDEPENDENT_AMBULATORY_CARE_PROVIDER_SITE_OTHER): Payer: Medicare HMO

## 2019-11-03 DIAGNOSIS — N186 End stage renal disease: Secondary | ICD-10-CM | POA: Diagnosis not present

## 2019-11-03 DIAGNOSIS — N2581 Secondary hyperparathyroidism of renal origin: Secondary | ICD-10-CM | POA: Diagnosis not present

## 2019-11-03 DIAGNOSIS — Z992 Dependence on renal dialysis: Secondary | ICD-10-CM | POA: Diagnosis not present

## 2019-11-06 DIAGNOSIS — Z992 Dependence on renal dialysis: Secondary | ICD-10-CM | POA: Diagnosis not present

## 2019-11-06 DIAGNOSIS — N2581 Secondary hyperparathyroidism of renal origin: Secondary | ICD-10-CM | POA: Diagnosis not present

## 2019-11-06 DIAGNOSIS — N186 End stage renal disease: Secondary | ICD-10-CM | POA: Diagnosis not present

## 2019-11-08 DIAGNOSIS — N2581 Secondary hyperparathyroidism of renal origin: Secondary | ICD-10-CM | POA: Diagnosis not present

## 2019-11-08 DIAGNOSIS — Z992 Dependence on renal dialysis: Secondary | ICD-10-CM | POA: Diagnosis not present

## 2019-11-08 DIAGNOSIS — N186 End stage renal disease: Secondary | ICD-10-CM | POA: Diagnosis not present

## 2019-11-10 DIAGNOSIS — Z992 Dependence on renal dialysis: Secondary | ICD-10-CM | POA: Diagnosis not present

## 2019-11-10 DIAGNOSIS — N186 End stage renal disease: Secondary | ICD-10-CM | POA: Diagnosis not present

## 2019-11-10 DIAGNOSIS — N2581 Secondary hyperparathyroidism of renal origin: Secondary | ICD-10-CM | POA: Diagnosis not present

## 2019-11-13 DIAGNOSIS — Z992 Dependence on renal dialysis: Secondary | ICD-10-CM | POA: Diagnosis not present

## 2019-11-13 DIAGNOSIS — N2581 Secondary hyperparathyroidism of renal origin: Secondary | ICD-10-CM | POA: Diagnosis not present

## 2019-11-13 DIAGNOSIS — N186 End stage renal disease: Secondary | ICD-10-CM | POA: Diagnosis not present

## 2019-11-15 DIAGNOSIS — Z992 Dependence on renal dialysis: Secondary | ICD-10-CM | POA: Diagnosis not present

## 2019-11-15 DIAGNOSIS — N2581 Secondary hyperparathyroidism of renal origin: Secondary | ICD-10-CM | POA: Diagnosis not present

## 2019-11-15 DIAGNOSIS — N186 End stage renal disease: Secondary | ICD-10-CM | POA: Diagnosis not present

## 2019-11-17 DIAGNOSIS — N2581 Secondary hyperparathyroidism of renal origin: Secondary | ICD-10-CM | POA: Diagnosis not present

## 2019-11-17 DIAGNOSIS — Z992 Dependence on renal dialysis: Secondary | ICD-10-CM | POA: Diagnosis not present

## 2019-11-17 DIAGNOSIS — N186 End stage renal disease: Secondary | ICD-10-CM | POA: Diagnosis not present

## 2019-11-20 DIAGNOSIS — N2581 Secondary hyperparathyroidism of renal origin: Secondary | ICD-10-CM | POA: Diagnosis not present

## 2019-11-20 DIAGNOSIS — N186 End stage renal disease: Secondary | ICD-10-CM | POA: Diagnosis not present

## 2019-11-20 DIAGNOSIS — Z992 Dependence on renal dialysis: Secondary | ICD-10-CM | POA: Diagnosis not present

## 2019-11-22 DIAGNOSIS — E1122 Type 2 diabetes mellitus with diabetic chronic kidney disease: Secondary | ICD-10-CM | POA: Diagnosis not present

## 2019-11-22 DIAGNOSIS — N2581 Secondary hyperparathyroidism of renal origin: Secondary | ICD-10-CM | POA: Diagnosis not present

## 2019-11-22 DIAGNOSIS — N186 End stage renal disease: Secondary | ICD-10-CM | POA: Diagnosis not present

## 2019-11-22 DIAGNOSIS — Z992 Dependence on renal dialysis: Secondary | ICD-10-CM | POA: Diagnosis not present

## 2019-11-24 DIAGNOSIS — Z992 Dependence on renal dialysis: Secondary | ICD-10-CM | POA: Diagnosis not present

## 2019-11-24 DIAGNOSIS — N2581 Secondary hyperparathyroidism of renal origin: Secondary | ICD-10-CM | POA: Diagnosis not present

## 2019-11-24 DIAGNOSIS — N186 End stage renal disease: Secondary | ICD-10-CM | POA: Diagnosis not present

## 2019-11-27 DIAGNOSIS — N2581 Secondary hyperparathyroidism of renal origin: Secondary | ICD-10-CM | POA: Diagnosis not present

## 2019-11-27 DIAGNOSIS — Z992 Dependence on renal dialysis: Secondary | ICD-10-CM | POA: Diagnosis not present

## 2019-11-27 DIAGNOSIS — N186 End stage renal disease: Secondary | ICD-10-CM | POA: Diagnosis not present

## 2019-11-29 DIAGNOSIS — N2581 Secondary hyperparathyroidism of renal origin: Secondary | ICD-10-CM | POA: Diagnosis not present

## 2019-11-29 DIAGNOSIS — N186 End stage renal disease: Secondary | ICD-10-CM | POA: Diagnosis not present

## 2019-11-29 DIAGNOSIS — Z992 Dependence on renal dialysis: Secondary | ICD-10-CM | POA: Diagnosis not present

## 2019-12-01 DIAGNOSIS — N186 End stage renal disease: Secondary | ICD-10-CM | POA: Diagnosis not present

## 2019-12-01 DIAGNOSIS — Z992 Dependence on renal dialysis: Secondary | ICD-10-CM | POA: Diagnosis not present

## 2019-12-01 DIAGNOSIS — N2581 Secondary hyperparathyroidism of renal origin: Secondary | ICD-10-CM | POA: Diagnosis not present

## 2019-12-04 DIAGNOSIS — Z992 Dependence on renal dialysis: Secondary | ICD-10-CM | POA: Diagnosis not present

## 2019-12-04 DIAGNOSIS — N2581 Secondary hyperparathyroidism of renal origin: Secondary | ICD-10-CM | POA: Diagnosis not present

## 2019-12-04 DIAGNOSIS — N186 End stage renal disease: Secondary | ICD-10-CM | POA: Diagnosis not present

## 2019-12-06 DIAGNOSIS — N2581 Secondary hyperparathyroidism of renal origin: Secondary | ICD-10-CM | POA: Diagnosis not present

## 2019-12-06 DIAGNOSIS — N186 End stage renal disease: Secondary | ICD-10-CM | POA: Diagnosis not present

## 2019-12-06 DIAGNOSIS — Z992 Dependence on renal dialysis: Secondary | ICD-10-CM | POA: Diagnosis not present

## 2019-12-08 DIAGNOSIS — Z992 Dependence on renal dialysis: Secondary | ICD-10-CM | POA: Diagnosis not present

## 2019-12-08 DIAGNOSIS — N2581 Secondary hyperparathyroidism of renal origin: Secondary | ICD-10-CM | POA: Diagnosis not present

## 2019-12-08 DIAGNOSIS — N186 End stage renal disease: Secondary | ICD-10-CM | POA: Diagnosis not present

## 2019-12-11 DIAGNOSIS — N2581 Secondary hyperparathyroidism of renal origin: Secondary | ICD-10-CM | POA: Diagnosis not present

## 2019-12-11 DIAGNOSIS — N186 End stage renal disease: Secondary | ICD-10-CM | POA: Diagnosis not present

## 2019-12-11 DIAGNOSIS — Z992 Dependence on renal dialysis: Secondary | ICD-10-CM | POA: Diagnosis not present

## 2019-12-13 DIAGNOSIS — N186 End stage renal disease: Secondary | ICD-10-CM | POA: Diagnosis not present

## 2019-12-13 DIAGNOSIS — N2581 Secondary hyperparathyroidism of renal origin: Secondary | ICD-10-CM | POA: Diagnosis not present

## 2019-12-13 DIAGNOSIS — Z992 Dependence on renal dialysis: Secondary | ICD-10-CM | POA: Diagnosis not present

## 2019-12-14 ENCOUNTER — Other Ambulatory Visit: Payer: Self-pay | Admitting: *Deleted

## 2019-12-14 MED ORDER — DOCUSATE SODIUM 100 MG PO CAPS: 100.0000 mg | ORAL_CAPSULE | Freq: Two times a day (BID) | ORAL | 6 refills | Status: DC

## 2019-12-14 MED ORDER — METOPROLOL SUCCINATE ER 50 MG PO TB24: 50.0000 mg | ORAL_TABLET | Freq: Every day | ORAL | 6 refills | Status: DC

## 2019-12-15 DIAGNOSIS — N186 End stage renal disease: Secondary | ICD-10-CM | POA: Diagnosis not present

## 2019-12-15 DIAGNOSIS — Z992 Dependence on renal dialysis: Secondary | ICD-10-CM | POA: Diagnosis not present

## 2019-12-15 DIAGNOSIS — N2581 Secondary hyperparathyroidism of renal origin: Secondary | ICD-10-CM | POA: Diagnosis not present

## 2019-12-18 DIAGNOSIS — N186 End stage renal disease: Secondary | ICD-10-CM | POA: Diagnosis not present

## 2019-12-18 DIAGNOSIS — Z992 Dependence on renal dialysis: Secondary | ICD-10-CM | POA: Diagnosis not present

## 2019-12-18 DIAGNOSIS — N2581 Secondary hyperparathyroidism of renal origin: Secondary | ICD-10-CM | POA: Diagnosis not present

## 2019-12-20 DIAGNOSIS — Z992 Dependence on renal dialysis: Secondary | ICD-10-CM | POA: Diagnosis not present

## 2019-12-20 DIAGNOSIS — N186 End stage renal disease: Secondary | ICD-10-CM | POA: Diagnosis not present

## 2019-12-20 DIAGNOSIS — N2581 Secondary hyperparathyroidism of renal origin: Secondary | ICD-10-CM | POA: Diagnosis not present

## 2019-12-22 DIAGNOSIS — N186 End stage renal disease: Secondary | ICD-10-CM | POA: Diagnosis not present

## 2019-12-22 DIAGNOSIS — N2581 Secondary hyperparathyroidism of renal origin: Secondary | ICD-10-CM | POA: Diagnosis not present

## 2019-12-22 DIAGNOSIS — Z992 Dependence on renal dialysis: Secondary | ICD-10-CM | POA: Diagnosis not present

## 2019-12-23 DIAGNOSIS — N186 End stage renal disease: Secondary | ICD-10-CM | POA: Diagnosis not present

## 2019-12-23 DIAGNOSIS — Z992 Dependence on renal dialysis: Secondary | ICD-10-CM | POA: Diagnosis not present

## 2019-12-23 DIAGNOSIS — E1122 Type 2 diabetes mellitus with diabetic chronic kidney disease: Secondary | ICD-10-CM | POA: Diagnosis not present

## 2019-12-24 DIAGNOSIS — Z0100 Encounter for examination of eyes and vision without abnormal findings: Secondary | ICD-10-CM

## 2019-12-24 DIAGNOSIS — E119 Type 2 diabetes mellitus without complications: Secondary | ICD-10-CM

## 2019-12-24 HISTORY — DX: Type 2 diabetes mellitus without complications: Z01.00

## 2019-12-24 HISTORY — DX: Type 2 diabetes mellitus without complications: E11.9

## 2019-12-25 DIAGNOSIS — N186 End stage renal disease: Secondary | ICD-10-CM | POA: Diagnosis not present

## 2019-12-25 DIAGNOSIS — Z992 Dependence on renal dialysis: Secondary | ICD-10-CM | POA: Diagnosis not present

## 2019-12-25 DIAGNOSIS — N2581 Secondary hyperparathyroidism of renal origin: Secondary | ICD-10-CM | POA: Diagnosis not present

## 2019-12-27 DIAGNOSIS — N186 End stage renal disease: Secondary | ICD-10-CM | POA: Diagnosis not present

## 2019-12-27 DIAGNOSIS — N2581 Secondary hyperparathyroidism of renal origin: Secondary | ICD-10-CM | POA: Diagnosis not present

## 2019-12-27 DIAGNOSIS — Z992 Dependence on renal dialysis: Secondary | ICD-10-CM | POA: Diagnosis not present

## 2019-12-29 DIAGNOSIS — Z992 Dependence on renal dialysis: Secondary | ICD-10-CM | POA: Diagnosis not present

## 2019-12-29 DIAGNOSIS — N186 End stage renal disease: Secondary | ICD-10-CM | POA: Diagnosis not present

## 2019-12-29 DIAGNOSIS — N2581 Secondary hyperparathyroidism of renal origin: Secondary | ICD-10-CM | POA: Diagnosis not present

## 2020-01-01 DIAGNOSIS — Z992 Dependence on renal dialysis: Secondary | ICD-10-CM | POA: Diagnosis not present

## 2020-01-01 DIAGNOSIS — N186 End stage renal disease: Secondary | ICD-10-CM | POA: Diagnosis not present

## 2020-01-01 DIAGNOSIS — N2581 Secondary hyperparathyroidism of renal origin: Secondary | ICD-10-CM | POA: Diagnosis not present

## 2020-01-03 DIAGNOSIS — Z992 Dependence on renal dialysis: Secondary | ICD-10-CM | POA: Diagnosis not present

## 2020-01-03 DIAGNOSIS — N2581 Secondary hyperparathyroidism of renal origin: Secondary | ICD-10-CM | POA: Diagnosis not present

## 2020-01-03 DIAGNOSIS — N186 End stage renal disease: Secondary | ICD-10-CM | POA: Diagnosis not present

## 2020-01-05 DIAGNOSIS — N2581 Secondary hyperparathyroidism of renal origin: Secondary | ICD-10-CM | POA: Diagnosis not present

## 2020-01-05 DIAGNOSIS — Z992 Dependence on renal dialysis: Secondary | ICD-10-CM | POA: Diagnosis not present

## 2020-01-05 DIAGNOSIS — N186 End stage renal disease: Secondary | ICD-10-CM | POA: Diagnosis not present

## 2020-01-08 DIAGNOSIS — Z992 Dependence on renal dialysis: Secondary | ICD-10-CM | POA: Diagnosis not present

## 2020-01-08 DIAGNOSIS — N2581 Secondary hyperparathyroidism of renal origin: Secondary | ICD-10-CM | POA: Diagnosis not present

## 2020-01-08 DIAGNOSIS — N186 End stage renal disease: Secondary | ICD-10-CM | POA: Diagnosis not present

## 2020-01-10 DIAGNOSIS — N2581 Secondary hyperparathyroidism of renal origin: Secondary | ICD-10-CM | POA: Diagnosis not present

## 2020-01-10 DIAGNOSIS — Z992 Dependence on renal dialysis: Secondary | ICD-10-CM | POA: Diagnosis not present

## 2020-01-10 DIAGNOSIS — N186 End stage renal disease: Secondary | ICD-10-CM | POA: Diagnosis not present

## 2020-01-12 DIAGNOSIS — Z992 Dependence on renal dialysis: Secondary | ICD-10-CM | POA: Diagnosis not present

## 2020-01-12 DIAGNOSIS — N2581 Secondary hyperparathyroidism of renal origin: Secondary | ICD-10-CM | POA: Diagnosis not present

## 2020-01-12 DIAGNOSIS — N186 End stage renal disease: Secondary | ICD-10-CM | POA: Diagnosis not present

## 2020-01-15 DIAGNOSIS — N186 End stage renal disease: Secondary | ICD-10-CM | POA: Diagnosis not present

## 2020-01-15 DIAGNOSIS — N2581 Secondary hyperparathyroidism of renal origin: Secondary | ICD-10-CM | POA: Diagnosis not present

## 2020-01-15 DIAGNOSIS — Z992 Dependence on renal dialysis: Secondary | ICD-10-CM | POA: Diagnosis not present

## 2020-01-17 DIAGNOSIS — N2581 Secondary hyperparathyroidism of renal origin: Secondary | ICD-10-CM | POA: Diagnosis not present

## 2020-01-17 DIAGNOSIS — N186 End stage renal disease: Secondary | ICD-10-CM | POA: Diagnosis not present

## 2020-01-17 DIAGNOSIS — Z992 Dependence on renal dialysis: Secondary | ICD-10-CM | POA: Diagnosis not present

## 2020-01-19 DIAGNOSIS — N186 End stage renal disease: Secondary | ICD-10-CM | POA: Diagnosis not present

## 2020-01-19 DIAGNOSIS — N2581 Secondary hyperparathyroidism of renal origin: Secondary | ICD-10-CM | POA: Diagnosis not present

## 2020-01-19 DIAGNOSIS — Z992 Dependence on renal dialysis: Secondary | ICD-10-CM | POA: Diagnosis not present

## 2020-01-22 ENCOUNTER — Other Ambulatory Visit: Payer: Self-pay | Admitting: Internal Medicine

## 2020-01-22 ENCOUNTER — Other Ambulatory Visit: Payer: Self-pay | Admitting: *Deleted

## 2020-01-22 DIAGNOSIS — Z992 Dependence on renal dialysis: Secondary | ICD-10-CM | POA: Diagnosis not present

## 2020-01-22 DIAGNOSIS — N2581 Secondary hyperparathyroidism of renal origin: Secondary | ICD-10-CM | POA: Diagnosis not present

## 2020-01-22 DIAGNOSIS — N186 End stage renal disease: Secondary | ICD-10-CM | POA: Diagnosis not present

## 2020-01-22 MED ORDER — OMEPRAZOLE 40 MG PO CPDR: 40.0000 mg | DELAYED_RELEASE_CAPSULE | Freq: Every day | ORAL | 6 refills | Status: DC

## 2020-01-23 DIAGNOSIS — Z992 Dependence on renal dialysis: Secondary | ICD-10-CM | POA: Diagnosis not present

## 2020-01-23 DIAGNOSIS — E1122 Type 2 diabetes mellitus with diabetic chronic kidney disease: Secondary | ICD-10-CM | POA: Diagnosis not present

## 2020-01-23 DIAGNOSIS — N186 End stage renal disease: Secondary | ICD-10-CM | POA: Diagnosis not present

## 2020-01-24 DIAGNOSIS — N2581 Secondary hyperparathyroidism of renal origin: Secondary | ICD-10-CM | POA: Diagnosis not present

## 2020-01-24 DIAGNOSIS — Z992 Dependence on renal dialysis: Secondary | ICD-10-CM | POA: Diagnosis not present

## 2020-01-24 DIAGNOSIS — N186 End stage renal disease: Secondary | ICD-10-CM | POA: Diagnosis not present

## 2020-01-26 DIAGNOSIS — Z992 Dependence on renal dialysis: Secondary | ICD-10-CM | POA: Diagnosis not present

## 2020-01-26 DIAGNOSIS — N2581 Secondary hyperparathyroidism of renal origin: Secondary | ICD-10-CM | POA: Diagnosis not present

## 2020-01-26 DIAGNOSIS — N186 End stage renal disease: Secondary | ICD-10-CM | POA: Diagnosis not present

## 2020-01-29 DIAGNOSIS — N2581 Secondary hyperparathyroidism of renal origin: Secondary | ICD-10-CM | POA: Diagnosis not present

## 2020-01-29 DIAGNOSIS — Z992 Dependence on renal dialysis: Secondary | ICD-10-CM | POA: Diagnosis not present

## 2020-01-29 DIAGNOSIS — N186 End stage renal disease: Secondary | ICD-10-CM | POA: Diagnosis not present

## 2020-01-31 DIAGNOSIS — Z992 Dependence on renal dialysis: Secondary | ICD-10-CM | POA: Diagnosis not present

## 2020-01-31 DIAGNOSIS — N186 End stage renal disease: Secondary | ICD-10-CM | POA: Diagnosis not present

## 2020-01-31 DIAGNOSIS — N2581 Secondary hyperparathyroidism of renal origin: Secondary | ICD-10-CM | POA: Diagnosis not present

## 2020-02-02 DIAGNOSIS — N186 End stage renal disease: Secondary | ICD-10-CM | POA: Diagnosis not present

## 2020-02-02 DIAGNOSIS — N2581 Secondary hyperparathyroidism of renal origin: Secondary | ICD-10-CM | POA: Diagnosis not present

## 2020-02-02 DIAGNOSIS — Z992 Dependence on renal dialysis: Secondary | ICD-10-CM | POA: Diagnosis not present

## 2020-02-05 DIAGNOSIS — N186 End stage renal disease: Secondary | ICD-10-CM | POA: Diagnosis not present

## 2020-02-05 DIAGNOSIS — Z992 Dependence on renal dialysis: Secondary | ICD-10-CM | POA: Diagnosis not present

## 2020-02-05 DIAGNOSIS — N2581 Secondary hyperparathyroidism of renal origin: Secondary | ICD-10-CM | POA: Diagnosis not present

## 2020-02-07 DIAGNOSIS — Z992 Dependence on renal dialysis: Secondary | ICD-10-CM | POA: Diagnosis not present

## 2020-02-07 DIAGNOSIS — N2581 Secondary hyperparathyroidism of renal origin: Secondary | ICD-10-CM | POA: Diagnosis not present

## 2020-02-07 DIAGNOSIS — N186 End stage renal disease: Secondary | ICD-10-CM | POA: Diagnosis not present

## 2020-02-09 DIAGNOSIS — N2581 Secondary hyperparathyroidism of renal origin: Secondary | ICD-10-CM | POA: Diagnosis not present

## 2020-02-09 DIAGNOSIS — Z992 Dependence on renal dialysis: Secondary | ICD-10-CM | POA: Diagnosis not present

## 2020-02-09 DIAGNOSIS — N186 End stage renal disease: Secondary | ICD-10-CM | POA: Diagnosis not present

## 2020-02-12 DIAGNOSIS — Z992 Dependence on renal dialysis: Secondary | ICD-10-CM | POA: Diagnosis not present

## 2020-02-12 DIAGNOSIS — N186 End stage renal disease: Secondary | ICD-10-CM | POA: Diagnosis not present

## 2020-02-12 DIAGNOSIS — N2581 Secondary hyperparathyroidism of renal origin: Secondary | ICD-10-CM | POA: Diagnosis not present

## 2020-02-15 DIAGNOSIS — T82858A Stenosis of vascular prosthetic devices, implants and grafts, initial encounter: Secondary | ICD-10-CM | POA: Diagnosis not present

## 2020-02-15 DIAGNOSIS — N2581 Secondary hyperparathyroidism of renal origin: Secondary | ICD-10-CM | POA: Diagnosis not present

## 2020-02-15 DIAGNOSIS — T82868A Thrombosis of vascular prosthetic devices, implants and grafts, initial encounter: Secondary | ICD-10-CM | POA: Diagnosis not present

## 2020-02-15 DIAGNOSIS — N186 End stage renal disease: Secondary | ICD-10-CM | POA: Diagnosis not present

## 2020-02-15 DIAGNOSIS — Z992 Dependence on renal dialysis: Secondary | ICD-10-CM | POA: Diagnosis not present

## 2020-02-16 DIAGNOSIS — Z992 Dependence on renal dialysis: Secondary | ICD-10-CM | POA: Diagnosis not present

## 2020-02-16 DIAGNOSIS — N186 End stage renal disease: Secondary | ICD-10-CM | POA: Diagnosis not present

## 2020-02-16 DIAGNOSIS — N2581 Secondary hyperparathyroidism of renal origin: Secondary | ICD-10-CM | POA: Diagnosis not present

## 2020-02-19 DIAGNOSIS — Z992 Dependence on renal dialysis: Secondary | ICD-10-CM | POA: Diagnosis not present

## 2020-02-19 DIAGNOSIS — N2581 Secondary hyperparathyroidism of renal origin: Secondary | ICD-10-CM | POA: Diagnosis not present

## 2020-02-19 DIAGNOSIS — N186 End stage renal disease: Secondary | ICD-10-CM | POA: Diagnosis not present

## 2020-02-21 DIAGNOSIS — N2581 Secondary hyperparathyroidism of renal origin: Secondary | ICD-10-CM | POA: Diagnosis not present

## 2020-02-21 DIAGNOSIS — N186 End stage renal disease: Secondary | ICD-10-CM | POA: Diagnosis not present

## 2020-02-21 DIAGNOSIS — Z992 Dependence on renal dialysis: Secondary | ICD-10-CM | POA: Diagnosis not present

## 2020-02-22 DIAGNOSIS — Z992 Dependence on renal dialysis: Secondary | ICD-10-CM | POA: Diagnosis not present

## 2020-02-22 DIAGNOSIS — E1122 Type 2 diabetes mellitus with diabetic chronic kidney disease: Secondary | ICD-10-CM | POA: Diagnosis not present

## 2020-02-22 DIAGNOSIS — N186 End stage renal disease: Secondary | ICD-10-CM | POA: Diagnosis not present

## 2020-02-23 DIAGNOSIS — N186 End stage renal disease: Secondary | ICD-10-CM | POA: Diagnosis not present

## 2020-02-23 DIAGNOSIS — Z992 Dependence on renal dialysis: Secondary | ICD-10-CM | POA: Diagnosis not present

## 2020-02-23 DIAGNOSIS — N2581 Secondary hyperparathyroidism of renal origin: Secondary | ICD-10-CM | POA: Diagnosis not present

## 2020-02-26 DIAGNOSIS — N186 End stage renal disease: Secondary | ICD-10-CM | POA: Diagnosis not present

## 2020-02-26 DIAGNOSIS — N2581 Secondary hyperparathyroidism of renal origin: Secondary | ICD-10-CM | POA: Diagnosis not present

## 2020-02-26 DIAGNOSIS — Z992 Dependence on renal dialysis: Secondary | ICD-10-CM | POA: Diagnosis not present

## 2020-02-28 DIAGNOSIS — N2581 Secondary hyperparathyroidism of renal origin: Secondary | ICD-10-CM | POA: Diagnosis not present

## 2020-02-28 DIAGNOSIS — N186 End stage renal disease: Secondary | ICD-10-CM | POA: Diagnosis not present

## 2020-02-28 DIAGNOSIS — Z992 Dependence on renal dialysis: Secondary | ICD-10-CM | POA: Diagnosis not present

## 2020-03-01 DIAGNOSIS — N186 End stage renal disease: Secondary | ICD-10-CM | POA: Diagnosis not present

## 2020-03-01 DIAGNOSIS — N2581 Secondary hyperparathyroidism of renal origin: Secondary | ICD-10-CM | POA: Diagnosis not present

## 2020-03-01 DIAGNOSIS — Z992 Dependence on renal dialysis: Secondary | ICD-10-CM | POA: Diagnosis not present

## 2020-03-04 DIAGNOSIS — Z992 Dependence on renal dialysis: Secondary | ICD-10-CM | POA: Diagnosis not present

## 2020-03-04 DIAGNOSIS — N186 End stage renal disease: Secondary | ICD-10-CM | POA: Diagnosis not present

## 2020-03-04 DIAGNOSIS — N2581 Secondary hyperparathyroidism of renal origin: Secondary | ICD-10-CM | POA: Diagnosis not present

## 2020-03-06 DIAGNOSIS — Z992 Dependence on renal dialysis: Secondary | ICD-10-CM | POA: Diagnosis not present

## 2020-03-06 DIAGNOSIS — N2581 Secondary hyperparathyroidism of renal origin: Secondary | ICD-10-CM | POA: Diagnosis not present

## 2020-03-06 DIAGNOSIS — N186 End stage renal disease: Secondary | ICD-10-CM | POA: Diagnosis not present

## 2020-03-08 DIAGNOSIS — N186 End stage renal disease: Secondary | ICD-10-CM | POA: Diagnosis not present

## 2020-03-08 DIAGNOSIS — Z992 Dependence on renal dialysis: Secondary | ICD-10-CM | POA: Diagnosis not present

## 2020-03-08 DIAGNOSIS — N2581 Secondary hyperparathyroidism of renal origin: Secondary | ICD-10-CM | POA: Diagnosis not present

## 2020-03-11 DIAGNOSIS — N2581 Secondary hyperparathyroidism of renal origin: Secondary | ICD-10-CM | POA: Diagnosis not present

## 2020-03-11 DIAGNOSIS — N186 End stage renal disease: Secondary | ICD-10-CM | POA: Diagnosis not present

## 2020-03-11 DIAGNOSIS — Z992 Dependence on renal dialysis: Secondary | ICD-10-CM | POA: Diagnosis not present

## 2020-03-12 ENCOUNTER — Ambulatory Visit (INDEPENDENT_AMBULATORY_CARE_PROVIDER_SITE_OTHER): Payer: Medicare HMO | Admitting: Internal Medicine

## 2020-03-12 ENCOUNTER — Other Ambulatory Visit: Payer: Self-pay

## 2020-03-12 ENCOUNTER — Encounter: Payer: Self-pay | Admitting: Internal Medicine

## 2020-03-12 VITALS — BP 158/70 | HR 78 | Ht 63.0 in | Wt 152.3 lb

## 2020-03-12 DIAGNOSIS — I10 Essential (primary) hypertension: Secondary | ICD-10-CM

## 2020-03-12 DIAGNOSIS — D631 Anemia in chronic kidney disease: Secondary | ICD-10-CM

## 2020-03-12 DIAGNOSIS — N186 End stage renal disease: Secondary | ICD-10-CM

## 2020-03-12 DIAGNOSIS — Z992 Dependence on renal dialysis: Secondary | ICD-10-CM

## 2020-03-12 DIAGNOSIS — Z0000 Encounter for general adult medical examination without abnormal findings: Secondary | ICD-10-CM | POA: Diagnosis not present

## 2020-03-12 DIAGNOSIS — E0849 Diabetes mellitus due to underlying condition with other diabetic neurological complication: Secondary | ICD-10-CM

## 2020-03-12 MED ORDER — LEVOTHYROXINE SODIUM 137 MCG PO TABS: 137.0000 ug | ORAL_TABLET | Freq: Every day | ORAL | 3 refills | Status: DC

## 2020-03-12 MED ORDER — SIMVASTATIN 20 MG PO TABS: 20.0000 mg | ORAL_TABLET | Freq: Every evening | ORAL | 3 refills | Status: DC

## 2020-03-12 MED ORDER — LOSARTAN POTASSIUM 25 MG PO TABS: 25.0000 mg | ORAL_TABLET | Freq: Every evening | ORAL | 3 refills | Status: DC

## 2020-03-12 MED ORDER — METOPROLOL SUCCINATE ER 50 MG PO TB24: 50.0000 mg | ORAL_TABLET | Freq: Every day | ORAL | 3 refills | Status: DC

## 2020-03-13 DIAGNOSIS — Z992 Dependence on renal dialysis: Secondary | ICD-10-CM | POA: Diagnosis not present

## 2020-03-13 DIAGNOSIS — N186 End stage renal disease: Secondary | ICD-10-CM | POA: Diagnosis not present

## 2020-03-13 DIAGNOSIS — N2581 Secondary hyperparathyroidism of renal origin: Secondary | ICD-10-CM | POA: Diagnosis not present

## 2020-03-14 DIAGNOSIS — I6381 Other cerebral infarction due to occlusion or stenosis of small artery: Secondary | ICD-10-CM | POA: Diagnosis not present

## 2020-03-14 DIAGNOSIS — G939 Disorder of brain, unspecified: Secondary | ICD-10-CM | POA: Diagnosis not present

## 2020-03-14 DIAGNOSIS — R202 Paresthesia of skin: Secondary | ICD-10-CM | POA: Diagnosis not present

## 2020-03-14 DIAGNOSIS — E1142 Type 2 diabetes mellitus with diabetic polyneuropathy: Secondary | ICD-10-CM | POA: Diagnosis not present

## 2020-03-14 DIAGNOSIS — R4189 Other symptoms and signs involving cognitive functions and awareness: Secondary | ICD-10-CM | POA: Diagnosis not present

## 2020-03-14 DIAGNOSIS — R200 Anesthesia of skin: Secondary | ICD-10-CM | POA: Diagnosis not present

## 2020-03-14 DIAGNOSIS — N186 End stage renal disease: Secondary | ICD-10-CM | POA: Diagnosis not present

## 2020-03-15 DIAGNOSIS — N186 End stage renal disease: Secondary | ICD-10-CM | POA: Diagnosis not present

## 2020-03-15 DIAGNOSIS — N2581 Secondary hyperparathyroidism of renal origin: Secondary | ICD-10-CM | POA: Diagnosis not present

## 2020-03-15 DIAGNOSIS — Z992 Dependence on renal dialysis: Secondary | ICD-10-CM | POA: Diagnosis not present

## 2020-03-17 ENCOUNTER — Encounter: Payer: Self-pay | Admitting: Internal Medicine

## 2020-03-17 DIAGNOSIS — Z0000 Encounter for general adult medical examination without abnormal findings: Secondary | ICD-10-CM | POA: Insufficient documentation

## 2020-03-17 NOTE — Assessment & Plan Note (Signed)
-   Today, the patient's blood pressure is well managed on metoprolol. - The patient will continue the current treatment regimen.  - I encouraged the patient to eat a low-sodium diet to help control blood pressure. - I encouraged the patient to live an active lifestyle and complete activities that increases heart rate to 85% target heart rate at least 5 times per week for one hour.

## 2020-03-17 NOTE — Progress Notes (Signed)
New Patient Office Visit  Subjective:  Patient ID: Joseph Mcpherson, male    DOB: December 15, 1945  Age: 74 y.o. MRN: [id number]  CC:  Chief Complaint  Patient presents with  . Annual Exam    HPI Patient presents for annual visit and regular checkup  Past Medical History:  Diagnosis Date  . Chronic kidney disease   . Clotting disorder ([place])    per daughter, "when patient travels, his feet swell"  . Diabetes mellitus without complication ([place])   . Diabetic eye exam ([place]) 12/2019  . GERD (gastroesophageal reflux disease)   . Hyperlipidemia   . Hypertension   . Hypothyroidism   . Thyroid disease      Current Outpatient Medications:  .  aspirin EC 325 MG EC tablet, Take 1 tablet (325 mg total) by mouth daily., Disp: 30 tablet, Rfl: 3 .  B Complex-C-Folic Acid (RENA-VITE RX) 1 MG TABS, Take 1 tablet by mouth daily. , Disp: , Rfl:  .  Calcium Acetate 668 (169 Ca) MG TABS, Take 667 mg by mouth in the morning, at noon, and at bedtime., Disp: , Rfl:  .  calcium acetate, Phos Binder, (PHOSLYRA) 667 MG/5ML SOLN, Take by mouth 3 (three) times daily with meals., Disp: , Rfl:  .  Cholecalciferol (VITAMIN D3) 5000 units TABS, Take 5,000 Units by mouth daily. , Disp: , Rfl:  .  docusate sodium (COLACE) 100 MG capsule, Take 1 capsule (100 mg total) by mouth 2 (two) times daily., Disp: 10 capsule, Rfl: 6 .  donepezil (ARICEPT) 10 MG tablet, Take 10 mg by mouth at bedtime. , Disp: , Rfl:  .  gabapentin (NEURONTIN) 300 MG capsule, Take 300 mg by mouth at bedtime. , Disp: , Rfl:  .  hydrALAZINE (APRESOLINE) 50 MG tablet, Take 1 tablet (50 mg total) by mouth 3 (three) times daily. (Patient taking differently: Take 25 mg by mouth 3 (three) times daily. ), Disp: 90 tablet, Rfl: 6 .  insulin aspart (NOVOLOG FLEXPEN) 100 UNIT/ML FlexPen, Inject 8 Units into the skin 2 (two) times daily. , Disp: , Rfl:  .  insulin detemir (LEVEMIR) 100 UNIT/ML injection, Inject 16 Units into the skin at bedtime. , Disp: , Rfl:    .  levothyroxine (SYNTHROID) 137 MCG tablet, Take 1 tablet (137 mcg total) by mouth daily., Disp: 90 tablet, Rfl: 3 .  losartan (COZAAR) 25 MG tablet, Take 1 tablet (25 mg total) by mouth every evening., Disp: 90 tablet, Rfl: 3 .  metoprolol succinate (TOPROL-XL) 50 MG 24 hr tablet, Take 1 tablet (50 mg total) by mouth daily., Disp: 90 tablet, Rfl: 3 .  omeprazole (PRILOSEC) 40 MG capsule, Take 1 capsule (40 mg total) by mouth daily., Disp: 30 capsule, Rfl: 6 .  sevelamer carbonate (RENVELA) 2.4 g PACK, Take 2.4 g by mouth 3 (three) times daily., Disp: , Rfl:  .  simvastatin (ZOCOR) 20 MG tablet, Take 1 tablet (20 mg total) by mouth every evening., Disp: 90 tablet, Rfl: 3 .  sitaGLIPtin (JANUVIA) 50 MG tablet, Take 50 mg by mouth every evening. , Disp: , Rfl:    Past Surgical History:  Procedure Laterality Date  . AV FISTULA PLACEMENT Right 10/22/2017   Procedure: ARTERIOVENOUS (AV) BRACHIOCEPHALIC FISTULA CREATION RIGHT UPPER ARM;  Surgeon: Conrad Roeland Park, MD;  Location: [facility];  Service: Vascular;  Laterality: Right;  . AV FISTULA PLACEMENT Left 02/03/2019   Procedure: INSERTION OF ARTERIOVENOUS (AV) GORE-TEX GRAFT ARM (BRACHIAL AXILLARY );  Surgeon: Hortencia Pilar  G, MD;  Location: [facility];  Service: Vascular;  Laterality: Left;  . UPPER EXTREMITY ANGIOGRAPHY Left 09/19/2019   Procedure: UPPER EXTREMITY ANGIOGRAPHY;  Surgeon: [name], MD;  Location: [facility];  Service: Cardiovascular;  Laterality: Left;    Family History  Problem Relation Age of Onset  . Diabetes Mother   . Hyperlipidemia Mother   . Hypertension Father     Social History   Socioeconomic History  . Marital status: Married    Spouse name: Not on file  . Number of children: Not on file  . Years of education: Not on file  . Highest education level: Not on file  Occupational History  . Not on file  Tobacco Use  . Smoking status: Never Smoker  . Smokeless tobacco: Former Systems developer    Types: Snuff   Vaping Use  . Vaping Use: Never used  Substance and Sexual Activity  . Alcohol use: No  . Drug use: No  . Sexual activity: Not Currently  Other Topics Concern  . Not on file  Social History Narrative  . Not on file   Social Determinants of Health   Financial Resource Strain:   . Difficulty of Paying Living Expenses: Not on file  Food Insecurity:   . Worried About Charity fundraiser in the Last Year: Not on file  . Ran Out of Food in the Last Year: Not on file  Transportation Needs:   . Lack of Transportation (Medical): Not on file  . Lack of Transportation (Non-Medical): Not on file  Physical Activity:   . Days of Exercise per Week: Not on file  . Minutes of Exercise per Session: Not on file  Stress:   . Feeling of Stress : Not on file  Social Connections:   . Frequency of Communication with Friends and Family: Not on file  . Frequency of Social Gatherings with Friends and Family: Not on file  . Attends Religious Services: Not on file  . Active Member of Clubs or Organizations: Not on file  . Attends Archivist Meetings: Not on file  . Marital Status: Not on file  Intimate Partner Violence:   . Fear of Current or Ex-Partner: Not on file  . Emotionally Abused: Not on file  . Physically Abused: Not on file  . Sexually Abused: Not on file    ROS Review of Systems  Objective:   Today's Vitals: BP (!) 158/70   Pulse 78   Ht 5\' 3"  (1.6 m)   Wt 152 lb 4.8 oz (69.1 kg)   BMI 26.98 kg/m   Physical Exam  Assessment & Plan:   Problem List Items Addressed This Visit      Cardiovascular and Mediastinum   Essential hypertension - Primary    - Today, the patient's blood pressure is well managed on metoprolol. - The patient will continue the current treatment regimen.  - I encouraged the patient to eat a low-sodium diet to help control blood pressure. - I encouraged the patient to live an active lifestyle and complete activities that increases heart rate to  85% target heart rate at least 5 times per week for one hour.          Relevant Medications   metoprolol succinate (TOPROL-XL) 50 MG 24 hr tablet   simvastatin (ZOCOR) 20 MG tablet   losartan (COZAAR) 25 MG tablet     Endocrine   Diabetic neuropathy associated with diabetes mellitus due to underlying condition ([place])  Patient goes to the eye doctor on a regular basis.      Relevant Medications   simvastatin (ZOCOR) 20 MG tablet   losartan (COZAAR) 25 MG tablet     Genitourinary   End stage renal disease (HCC)    Patient is under the care of a nephrologist.  He also takes losartan 25 mg p.o. daily        Other   Anemia in chronic kidney disease    Patient has anemia of chronic disease.      Annual physical exam    Patient is known to have diabetes with diabetic nephropathy.  His reflux is under control.  He takes statin for dyslipidemia blood pressure is slightly high today but normally it is under control.  He has a mild Alzheimer disease and take Aricept for that.         Outpatient Encounter Medications as of 03/12/2020  Medication Sig  . aspirin EC 325 MG EC tablet Take 1 tablet (325 mg total) by mouth daily.  . B Complex-C-Folic Acid (RENA-VITE RX) 1 MG TABS Take 1 tablet by mouth daily.   . Calcium Acetate 668 (169 Ca) MG TABS Take 667 mg by mouth in the morning, at noon, and at bedtime.  . calcium acetate, Phos Binder, (PHOSLYRA) 667 MG/5ML SOLN Take by mouth 3 (three) times daily with meals.  . Cholecalciferol (VITAMIN D3) 5000 units TABS Take 5,000 Units by mouth daily.   Marland Kitchen docusate sodium (COLACE) 100 MG capsule Take 1 capsule (100 mg total) by mouth 2 (two) times daily.  Marland Kitchen donepezil (ARICEPT) 10 MG tablet Take 10 mg by mouth at bedtime.   . gabapentin (NEURONTIN) 300 MG capsule Take 300 mg by mouth at bedtime.   . hydrALAZINE (APRESOLINE) 50 MG tablet Take 1 tablet (50 mg total) by mouth 3 (three) times daily. (Patient taking differently: Take 25 mg by mouth  3 (three) times daily. )  . insulin aspart (NOVOLOG FLEXPEN) 100 UNIT/ML FlexPen Inject 8 Units into the skin 2 (two) times daily.   . insulin detemir (LEVEMIR) 100 UNIT/ML injection Inject 16 Units into the skin at bedtime.   Marland Kitchen levothyroxine (SYNTHROID) 137 MCG tablet Take 1 tablet (137 mcg total) by mouth daily.  Marland Kitchen losartan (COZAAR) 25 MG tablet Take 1 tablet (25 mg total) by mouth every evening.  . metoprolol succinate (TOPROL-XL) 50 MG 24 hr tablet Take 1 tablet (50 mg total) by mouth daily.  Marland Kitchen omeprazole (PRILOSEC) 40 MG capsule Take 1 capsule (40 mg total) by mouth daily.  . sevelamer carbonate (RENVELA) 2.4 g PACK Take 2.4 g by mouth 3 (three) times daily.  . simvastatin (ZOCOR) 20 MG tablet Take 1 tablet (20 mg total) by mouth every evening.  . sitaGLIPtin (JANUVIA) 50 MG tablet Take 50 mg by mouth every evening.   . [DISCONTINUED] levothyroxine (SYNTHROID) 137 MCG tablet TAKE 1 TABLET BY MOUTH EVERY DAY  . [DISCONTINUED] losartan (COZAAR) 25 MG tablet Take 25 mg by mouth every evening.   . [DISCONTINUED] metoprolol succinate (TOPROL-XL) 50 MG 24 hr tablet Take 1 tablet (50 mg total) by mouth daily.  . [DISCONTINUED] simvastatin (ZOCOR) 20 MG tablet Take 20 mg by mouth every evening.    No facility-administered encounter medications on file as of 03/12/2020.    Follow-up: No follow-ups on file.   [name], MD

## 2020-03-17 NOTE — Assessment & Plan Note (Signed)
Patient goes to the eye doctor on a regular basis.

## 2020-03-17 NOTE — Assessment & Plan Note (Signed)
Patient has anemia of chronic disease.

## 2020-03-17 NOTE — Assessment & Plan Note (Signed)
Patient is known to have diabetes with diabetic nephropathy.  His reflux is under control.  He takes statin for dyslipidemia blood pressure is slightly high today but normally it is under control.  He has a mild Alzheimer disease and take Aricept for that.

## 2020-03-17 NOTE — Assessment & Plan Note (Signed)
Patient is under the care of a nephrologist.  He also takes losartan 25 mg p.o. daily

## 2020-03-18 DIAGNOSIS — N186 End stage renal disease: Secondary | ICD-10-CM | POA: Diagnosis not present

## 2020-03-18 DIAGNOSIS — Z992 Dependence on renal dialysis: Secondary | ICD-10-CM | POA: Diagnosis not present

## 2020-03-18 DIAGNOSIS — N2581 Secondary hyperparathyroidism of renal origin: Secondary | ICD-10-CM | POA: Diagnosis not present

## 2020-03-20 DIAGNOSIS — Z992 Dependence on renal dialysis: Secondary | ICD-10-CM | POA: Diagnosis not present

## 2020-03-20 DIAGNOSIS — N2581 Secondary hyperparathyroidism of renal origin: Secondary | ICD-10-CM | POA: Diagnosis not present

## 2020-03-20 DIAGNOSIS — N186 End stage renal disease: Secondary | ICD-10-CM | POA: Diagnosis not present

## 2020-03-22 DIAGNOSIS — N186 End stage renal disease: Secondary | ICD-10-CM | POA: Diagnosis not present

## 2020-03-22 DIAGNOSIS — N2581 Secondary hyperparathyroidism of renal origin: Secondary | ICD-10-CM | POA: Diagnosis not present

## 2020-03-22 DIAGNOSIS — Z992 Dependence on renal dialysis: Secondary | ICD-10-CM | POA: Diagnosis not present

## 2020-03-24 DIAGNOSIS — Z992 Dependence on renal dialysis: Secondary | ICD-10-CM | POA: Diagnosis not present

## 2020-03-24 DIAGNOSIS — E1122 Type 2 diabetes mellitus with diabetic chronic kidney disease: Secondary | ICD-10-CM | POA: Diagnosis not present

## 2020-03-24 DIAGNOSIS — N186 End stage renal disease: Secondary | ICD-10-CM | POA: Diagnosis not present

## 2020-03-25 DIAGNOSIS — Z992 Dependence on renal dialysis: Secondary | ICD-10-CM | POA: Diagnosis not present

## 2020-03-25 DIAGNOSIS — N186 End stage renal disease: Secondary | ICD-10-CM | POA: Diagnosis not present

## 2020-03-25 DIAGNOSIS — N2581 Secondary hyperparathyroidism of renal origin: Secondary | ICD-10-CM | POA: Diagnosis not present

## 2020-03-27 DIAGNOSIS — Z992 Dependence on renal dialysis: Secondary | ICD-10-CM | POA: Diagnosis not present

## 2020-03-27 DIAGNOSIS — N186 End stage renal disease: Secondary | ICD-10-CM | POA: Diagnosis not present

## 2020-03-27 DIAGNOSIS — N2581 Secondary hyperparathyroidism of renal origin: Secondary | ICD-10-CM | POA: Diagnosis not present

## 2020-03-29 DIAGNOSIS — N186 End stage renal disease: Secondary | ICD-10-CM | POA: Diagnosis not present

## 2020-03-29 DIAGNOSIS — N2581 Secondary hyperparathyroidism of renal origin: Secondary | ICD-10-CM | POA: Diagnosis not present

## 2020-03-29 DIAGNOSIS — Z992 Dependence on renal dialysis: Secondary | ICD-10-CM | POA: Diagnosis not present

## 2020-04-01 DIAGNOSIS — Z992 Dependence on renal dialysis: Secondary | ICD-10-CM | POA: Diagnosis not present

## 2020-04-01 DIAGNOSIS — N2581 Secondary hyperparathyroidism of renal origin: Secondary | ICD-10-CM | POA: Diagnosis not present

## 2020-04-01 DIAGNOSIS — N186 End stage renal disease: Secondary | ICD-10-CM | POA: Diagnosis not present

## 2020-04-03 DIAGNOSIS — Z992 Dependence on renal dialysis: Secondary | ICD-10-CM | POA: Diagnosis not present

## 2020-04-03 DIAGNOSIS — N186 End stage renal disease: Secondary | ICD-10-CM | POA: Diagnosis not present

## 2020-04-03 DIAGNOSIS — N2581 Secondary hyperparathyroidism of renal origin: Secondary | ICD-10-CM | POA: Diagnosis not present

## 2020-04-05 DIAGNOSIS — N186 End stage renal disease: Secondary | ICD-10-CM | POA: Diagnosis not present

## 2020-04-05 DIAGNOSIS — N2581 Secondary hyperparathyroidism of renal origin: Secondary | ICD-10-CM | POA: Diagnosis not present

## 2020-04-05 DIAGNOSIS — Z992 Dependence on renal dialysis: Secondary | ICD-10-CM | POA: Diagnosis not present

## 2020-04-08 ENCOUNTER — Other Ambulatory Visit: Payer: Self-pay | Admitting: *Deleted

## 2020-04-08 DIAGNOSIS — N2581 Secondary hyperparathyroidism of renal origin: Secondary | ICD-10-CM | POA: Diagnosis not present

## 2020-04-08 DIAGNOSIS — Z992 Dependence on renal dialysis: Secondary | ICD-10-CM | POA: Diagnosis not present

## 2020-04-08 DIAGNOSIS — N186 End stage renal disease: Secondary | ICD-10-CM | POA: Diagnosis not present

## 2020-04-08 MED ORDER — FREESTYLE LIBRE 14 DAY SENSOR MISC: 1.0000 | 3 refills | Status: DC

## 2020-04-10 DIAGNOSIS — N2581 Secondary hyperparathyroidism of renal origin: Secondary | ICD-10-CM | POA: Diagnosis not present

## 2020-04-10 DIAGNOSIS — N186 End stage renal disease: Secondary | ICD-10-CM | POA: Diagnosis not present

## 2020-04-10 DIAGNOSIS — Z992 Dependence on renal dialysis: Secondary | ICD-10-CM | POA: Diagnosis not present

## 2020-04-12 DIAGNOSIS — N2581 Secondary hyperparathyroidism of renal origin: Secondary | ICD-10-CM | POA: Diagnosis not present

## 2020-04-12 DIAGNOSIS — Z992 Dependence on renal dialysis: Secondary | ICD-10-CM | POA: Diagnosis not present

## 2020-04-12 DIAGNOSIS — N186 End stage renal disease: Secondary | ICD-10-CM | POA: Diagnosis not present

## 2020-04-13 DIAGNOSIS — Z20822 Contact with and (suspected) exposure to covid-19: Secondary | ICD-10-CM | POA: Diagnosis not present

## 2020-04-15 DIAGNOSIS — Z992 Dependence on renal dialysis: Secondary | ICD-10-CM | POA: Diagnosis not present

## 2020-04-15 DIAGNOSIS — N186 End stage renal disease: Secondary | ICD-10-CM | POA: Diagnosis not present

## 2020-04-17 DIAGNOSIS — N186 End stage renal disease: Secondary | ICD-10-CM | POA: Diagnosis not present

## 2020-04-17 DIAGNOSIS — Z992 Dependence on renal dialysis: Secondary | ICD-10-CM | POA: Diagnosis not present

## 2020-04-19 DIAGNOSIS — Z992 Dependence on renal dialysis: Secondary | ICD-10-CM | POA: Diagnosis not present

## 2020-04-19 DIAGNOSIS — N186 End stage renal disease: Secondary | ICD-10-CM | POA: Diagnosis not present

## 2020-04-22 DIAGNOSIS — Z992 Dependence on renal dialysis: Secondary | ICD-10-CM | POA: Diagnosis not present

## 2020-04-22 DIAGNOSIS — N186 End stage renal disease: Secondary | ICD-10-CM | POA: Diagnosis not present

## 2020-04-23 DIAGNOSIS — E1122 Type 2 diabetes mellitus with diabetic chronic kidney disease: Secondary | ICD-10-CM | POA: Diagnosis not present

## 2020-04-23 DIAGNOSIS — N186 End stage renal disease: Secondary | ICD-10-CM | POA: Diagnosis not present

## 2020-04-23 DIAGNOSIS — Z992 Dependence on renal dialysis: Secondary | ICD-10-CM | POA: Diagnosis not present

## 2020-04-24 DIAGNOSIS — Z992 Dependence on renal dialysis: Secondary | ICD-10-CM | POA: Diagnosis not present

## 2020-04-24 DIAGNOSIS — N186 End stage renal disease: Secondary | ICD-10-CM | POA: Diagnosis not present

## 2020-04-26 DIAGNOSIS — Z992 Dependence on renal dialysis: Secondary | ICD-10-CM | POA: Diagnosis not present

## 2020-04-26 DIAGNOSIS — N186 End stage renal disease: Secondary | ICD-10-CM | POA: Diagnosis not present

## 2020-04-29 DIAGNOSIS — N186 End stage renal disease: Secondary | ICD-10-CM | POA: Diagnosis not present

## 2020-04-29 DIAGNOSIS — Z992 Dependence on renal dialysis: Secondary | ICD-10-CM | POA: Diagnosis not present

## 2020-05-01 DIAGNOSIS — Z992 Dependence on renal dialysis: Secondary | ICD-10-CM | POA: Diagnosis not present

## 2020-05-01 DIAGNOSIS — N186 End stage renal disease: Secondary | ICD-10-CM | POA: Diagnosis not present

## 2020-05-03 DIAGNOSIS — Z992 Dependence on renal dialysis: Secondary | ICD-10-CM | POA: Diagnosis not present

## 2020-05-03 DIAGNOSIS — N186 End stage renal disease: Secondary | ICD-10-CM | POA: Diagnosis not present

## 2020-05-07 DIAGNOSIS — T82898A Other specified complication of vascular prosthetic devices, implants and grafts, initial encounter: Secondary | ICD-10-CM | POA: Diagnosis not present

## 2020-05-07 DIAGNOSIS — Z992 Dependence on renal dialysis: Secondary | ICD-10-CM | POA: Diagnosis not present

## 2020-05-07 DIAGNOSIS — T82591A Other mechanical complication of surgically created arteriovenous shunt, initial encounter: Secondary | ICD-10-CM | POA: Diagnosis not present

## 2020-05-07 DIAGNOSIS — T82868A Thrombosis of vascular prosthetic devices, implants and grafts, initial encounter: Secondary | ICD-10-CM | POA: Diagnosis not present

## 2020-05-07 DIAGNOSIS — N186 End stage renal disease: Secondary | ICD-10-CM | POA: Diagnosis not present

## 2020-05-08 DIAGNOSIS — Z992 Dependence on renal dialysis: Secondary | ICD-10-CM | POA: Diagnosis not present

## 2020-05-08 DIAGNOSIS — N186 End stage renal disease: Secondary | ICD-10-CM | POA: Diagnosis not present

## 2020-05-10 DIAGNOSIS — N186 End stage renal disease: Secondary | ICD-10-CM | POA: Diagnosis not present

## 2020-05-10 DIAGNOSIS — Z992 Dependence on renal dialysis: Secondary | ICD-10-CM | POA: Diagnosis not present

## 2020-05-13 DIAGNOSIS — N2581 Secondary hyperparathyroidism of renal origin: Secondary | ICD-10-CM | POA: Diagnosis not present

## 2020-05-13 DIAGNOSIS — N186 End stage renal disease: Secondary | ICD-10-CM | POA: Diagnosis not present

## 2020-05-13 DIAGNOSIS — Z992 Dependence on renal dialysis: Secondary | ICD-10-CM | POA: Diagnosis not present

## 2020-05-15 DIAGNOSIS — N186 End stage renal disease: Secondary | ICD-10-CM | POA: Diagnosis not present

## 2020-05-15 DIAGNOSIS — N2581 Secondary hyperparathyroidism of renal origin: Secondary | ICD-10-CM | POA: Diagnosis not present

## 2020-05-15 DIAGNOSIS — Z992 Dependence on renal dialysis: Secondary | ICD-10-CM | POA: Diagnosis not present

## 2020-05-17 DIAGNOSIS — N2581 Secondary hyperparathyroidism of renal origin: Secondary | ICD-10-CM | POA: Diagnosis not present

## 2020-05-17 DIAGNOSIS — Z992 Dependence on renal dialysis: Secondary | ICD-10-CM | POA: Diagnosis not present

## 2020-05-17 DIAGNOSIS — N186 End stage renal disease: Secondary | ICD-10-CM | POA: Diagnosis not present

## 2020-05-21 DIAGNOSIS — Z992 Dependence on renal dialysis: Secondary | ICD-10-CM | POA: Diagnosis not present

## 2020-05-21 DIAGNOSIS — N186 End stage renal disease: Secondary | ICD-10-CM | POA: Diagnosis not present

## 2020-05-21 DIAGNOSIS — T82858A Stenosis of vascular prosthetic devices, implants and grafts, initial encounter: Secondary | ICD-10-CM | POA: Diagnosis not present

## 2020-05-21 DIAGNOSIS — T82868A Thrombosis of vascular prosthetic devices, implants and grafts, initial encounter: Secondary | ICD-10-CM | POA: Diagnosis not present

## 2020-05-22 DIAGNOSIS — N2581 Secondary hyperparathyroidism of renal origin: Secondary | ICD-10-CM | POA: Diagnosis not present

## 2020-05-22 DIAGNOSIS — Z992 Dependence on renal dialysis: Secondary | ICD-10-CM | POA: Diagnosis not present

## 2020-05-22 DIAGNOSIS — N186 End stage renal disease: Secondary | ICD-10-CM | POA: Diagnosis not present

## 2020-05-23 DIAGNOSIS — N2581 Secondary hyperparathyroidism of renal origin: Secondary | ICD-10-CM | POA: Diagnosis not present

## 2020-05-23 DIAGNOSIS — N186 End stage renal disease: Secondary | ICD-10-CM | POA: Diagnosis not present

## 2020-05-23 DIAGNOSIS — Z992 Dependence on renal dialysis: Secondary | ICD-10-CM | POA: Diagnosis not present

## 2020-05-24 DIAGNOSIS — E1122 Type 2 diabetes mellitus with diabetic chronic kidney disease: Secondary | ICD-10-CM | POA: Diagnosis not present

## 2020-05-24 DIAGNOSIS — N186 End stage renal disease: Secondary | ICD-10-CM | POA: Diagnosis not present

## 2020-05-24 DIAGNOSIS — Z992 Dependence on renal dialysis: Secondary | ICD-10-CM | POA: Diagnosis not present

## 2020-05-27 DIAGNOSIS — N2581 Secondary hyperparathyroidism of renal origin: Secondary | ICD-10-CM | POA: Diagnosis not present

## 2020-05-27 DIAGNOSIS — N186 End stage renal disease: Secondary | ICD-10-CM | POA: Diagnosis not present

## 2020-05-27 DIAGNOSIS — Z992 Dependence on renal dialysis: Secondary | ICD-10-CM | POA: Diagnosis not present

## 2020-05-29 DIAGNOSIS — Z992 Dependence on renal dialysis: Secondary | ICD-10-CM | POA: Diagnosis not present

## 2020-05-29 DIAGNOSIS — N186 End stage renal disease: Secondary | ICD-10-CM | POA: Diagnosis not present

## 2020-05-29 DIAGNOSIS — N2581 Secondary hyperparathyroidism of renal origin: Secondary | ICD-10-CM | POA: Diagnosis not present

## 2020-05-31 DIAGNOSIS — N2581 Secondary hyperparathyroidism of renal origin: Secondary | ICD-10-CM | POA: Diagnosis not present

## 2020-05-31 DIAGNOSIS — N186 End stage renal disease: Secondary | ICD-10-CM | POA: Diagnosis not present

## 2020-05-31 DIAGNOSIS — Z992 Dependence on renal dialysis: Secondary | ICD-10-CM | POA: Diagnosis not present

## 2020-06-03 DIAGNOSIS — N186 End stage renal disease: Secondary | ICD-10-CM | POA: Diagnosis not present

## 2020-06-03 DIAGNOSIS — N2581 Secondary hyperparathyroidism of renal origin: Secondary | ICD-10-CM | POA: Diagnosis not present

## 2020-06-03 DIAGNOSIS — Z992 Dependence on renal dialysis: Secondary | ICD-10-CM | POA: Diagnosis not present

## 2020-06-05 DIAGNOSIS — N2581 Secondary hyperparathyroidism of renal origin: Secondary | ICD-10-CM | POA: Diagnosis not present

## 2020-06-05 DIAGNOSIS — Z992 Dependence on renal dialysis: Secondary | ICD-10-CM | POA: Diagnosis not present

## 2020-06-05 DIAGNOSIS — N186 End stage renal disease: Secondary | ICD-10-CM | POA: Diagnosis not present

## 2020-06-07 DIAGNOSIS — N2581 Secondary hyperparathyroidism of renal origin: Secondary | ICD-10-CM | POA: Diagnosis not present

## 2020-06-07 DIAGNOSIS — Z992 Dependence on renal dialysis: Secondary | ICD-10-CM | POA: Diagnosis not present

## 2020-06-07 DIAGNOSIS — N186 End stage renal disease: Secondary | ICD-10-CM | POA: Diagnosis not present

## 2020-06-10 DIAGNOSIS — N2581 Secondary hyperparathyroidism of renal origin: Secondary | ICD-10-CM | POA: Diagnosis not present

## 2020-06-10 DIAGNOSIS — Z992 Dependence on renal dialysis: Secondary | ICD-10-CM | POA: Diagnosis not present

## 2020-06-10 DIAGNOSIS — N186 End stage renal disease: Secondary | ICD-10-CM | POA: Diagnosis not present

## 2020-06-12 DIAGNOSIS — Z992 Dependence on renal dialysis: Secondary | ICD-10-CM | POA: Diagnosis not present

## 2020-06-12 DIAGNOSIS — N186 End stage renal disease: Secondary | ICD-10-CM | POA: Diagnosis not present

## 2020-06-12 DIAGNOSIS — N2581 Secondary hyperparathyroidism of renal origin: Secondary | ICD-10-CM | POA: Diagnosis not present

## 2020-06-14 DIAGNOSIS — Z992 Dependence on renal dialysis: Secondary | ICD-10-CM | POA: Diagnosis not present

## 2020-06-14 DIAGNOSIS — N186 End stage renal disease: Secondary | ICD-10-CM | POA: Diagnosis not present

## 2020-06-14 DIAGNOSIS — N2581 Secondary hyperparathyroidism of renal origin: Secondary | ICD-10-CM | POA: Diagnosis not present

## 2020-06-17 DIAGNOSIS — N186 End stage renal disease: Secondary | ICD-10-CM | POA: Diagnosis not present

## 2020-06-17 DIAGNOSIS — N2581 Secondary hyperparathyroidism of renal origin: Secondary | ICD-10-CM | POA: Diagnosis not present

## 2020-06-17 DIAGNOSIS — Z992 Dependence on renal dialysis: Secondary | ICD-10-CM | POA: Diagnosis not present

## 2020-06-19 DIAGNOSIS — Z992 Dependence on renal dialysis: Secondary | ICD-10-CM | POA: Diagnosis not present

## 2020-06-19 DIAGNOSIS — N186 End stage renal disease: Secondary | ICD-10-CM | POA: Diagnosis not present

## 2020-06-19 DIAGNOSIS — N2581 Secondary hyperparathyroidism of renal origin: Secondary | ICD-10-CM | POA: Diagnosis not present

## 2020-06-21 DIAGNOSIS — N2581 Secondary hyperparathyroidism of renal origin: Secondary | ICD-10-CM | POA: Diagnosis not present

## 2020-06-21 DIAGNOSIS — Z992 Dependence on renal dialysis: Secondary | ICD-10-CM | POA: Diagnosis not present

## 2020-06-21 DIAGNOSIS — N186 End stage renal disease: Secondary | ICD-10-CM | POA: Diagnosis not present

## 2020-06-24 DIAGNOSIS — E1122 Type 2 diabetes mellitus with diabetic chronic kidney disease: Secondary | ICD-10-CM | POA: Diagnosis not present

## 2020-06-24 DIAGNOSIS — Z992 Dependence on renal dialysis: Secondary | ICD-10-CM | POA: Diagnosis not present

## 2020-06-24 DIAGNOSIS — N186 End stage renal disease: Secondary | ICD-10-CM | POA: Diagnosis not present

## 2020-06-25 DIAGNOSIS — Z992 Dependence on renal dialysis: Secondary | ICD-10-CM | POA: Diagnosis not present

## 2020-06-25 DIAGNOSIS — N186 End stage renal disease: Secondary | ICD-10-CM | POA: Diagnosis not present

## 2020-06-25 DIAGNOSIS — N2581 Secondary hyperparathyroidism of renal origin: Secondary | ICD-10-CM | POA: Diagnosis not present

## 2020-06-27 DIAGNOSIS — N186 End stage renal disease: Secondary | ICD-10-CM | POA: Diagnosis not present

## 2020-06-27 DIAGNOSIS — Z992 Dependence on renal dialysis: Secondary | ICD-10-CM | POA: Diagnosis not present

## 2020-06-27 DIAGNOSIS — T82858A Stenosis of vascular prosthetic devices, implants and grafts, initial encounter: Secondary | ICD-10-CM | POA: Diagnosis not present

## 2020-06-27 DIAGNOSIS — T82868A Thrombosis of vascular prosthetic devices, implants and grafts, initial encounter: Secondary | ICD-10-CM | POA: Diagnosis not present

## 2020-06-28 DIAGNOSIS — N186 End stage renal disease: Secondary | ICD-10-CM | POA: Diagnosis not present

## 2020-06-28 DIAGNOSIS — N2581 Secondary hyperparathyroidism of renal origin: Secondary | ICD-10-CM | POA: Diagnosis not present

## 2020-06-28 DIAGNOSIS — Z992 Dependence on renal dialysis: Secondary | ICD-10-CM | POA: Diagnosis not present

## 2020-07-01 DIAGNOSIS — N186 End stage renal disease: Secondary | ICD-10-CM | POA: Diagnosis not present

## 2020-07-01 DIAGNOSIS — Z992 Dependence on renal dialysis: Secondary | ICD-10-CM | POA: Diagnosis not present

## 2020-07-01 DIAGNOSIS — N2581 Secondary hyperparathyroidism of renal origin: Secondary | ICD-10-CM | POA: Diagnosis not present

## 2020-07-03 DIAGNOSIS — Z992 Dependence on renal dialysis: Secondary | ICD-10-CM | POA: Diagnosis not present

## 2020-07-03 DIAGNOSIS — N186 End stage renal disease: Secondary | ICD-10-CM | POA: Diagnosis not present

## 2020-07-03 DIAGNOSIS — N2581 Secondary hyperparathyroidism of renal origin: Secondary | ICD-10-CM | POA: Diagnosis not present

## 2020-07-05 DIAGNOSIS — N186 End stage renal disease: Secondary | ICD-10-CM | POA: Diagnosis not present

## 2020-07-05 DIAGNOSIS — N2581 Secondary hyperparathyroidism of renal origin: Secondary | ICD-10-CM | POA: Diagnosis not present

## 2020-07-05 DIAGNOSIS — Z992 Dependence on renal dialysis: Secondary | ICD-10-CM | POA: Diagnosis not present

## 2020-07-08 DIAGNOSIS — Z992 Dependence on renal dialysis: Secondary | ICD-10-CM | POA: Diagnosis not present

## 2020-07-08 DIAGNOSIS — N2581 Secondary hyperparathyroidism of renal origin: Secondary | ICD-10-CM | POA: Diagnosis not present

## 2020-07-08 DIAGNOSIS — N186 End stage renal disease: Secondary | ICD-10-CM | POA: Diagnosis not present

## 2020-07-10 DIAGNOSIS — N2581 Secondary hyperparathyroidism of renal origin: Secondary | ICD-10-CM | POA: Diagnosis not present

## 2020-07-10 DIAGNOSIS — Z992 Dependence on renal dialysis: Secondary | ICD-10-CM | POA: Diagnosis not present

## 2020-07-10 DIAGNOSIS — N186 End stage renal disease: Secondary | ICD-10-CM | POA: Diagnosis not present

## 2020-07-12 DIAGNOSIS — T82858A Stenosis of vascular prosthetic devices, implants and grafts, initial encounter: Secondary | ICD-10-CM | POA: Diagnosis not present

## 2020-07-12 DIAGNOSIS — Z992 Dependence on renal dialysis: Secondary | ICD-10-CM | POA: Diagnosis not present

## 2020-07-12 DIAGNOSIS — T82868A Thrombosis of vascular prosthetic devices, implants and grafts, initial encounter: Secondary | ICD-10-CM | POA: Diagnosis not present

## 2020-07-12 DIAGNOSIS — N186 End stage renal disease: Secondary | ICD-10-CM | POA: Diagnosis not present

## 2020-07-15 DIAGNOSIS — N2581 Secondary hyperparathyroidism of renal origin: Secondary | ICD-10-CM | POA: Diagnosis not present

## 2020-07-15 DIAGNOSIS — N186 End stage renal disease: Secondary | ICD-10-CM | POA: Diagnosis not present

## 2020-07-15 DIAGNOSIS — Z992 Dependence on renal dialysis: Secondary | ICD-10-CM | POA: Diagnosis not present

## 2020-07-16 ENCOUNTER — Other Ambulatory Visit: Payer: Self-pay

## 2020-07-16 ENCOUNTER — Encounter: Payer: Self-pay | Admitting: Internal Medicine

## 2020-07-16 ENCOUNTER — Ambulatory Visit (INDEPENDENT_AMBULATORY_CARE_PROVIDER_SITE_OTHER): Payer: Medicare HMO | Admitting: Internal Medicine

## 2020-07-16 VITALS — BP 100/70 | HR 80 | Ht 64.0 in | Wt 155.9 lb

## 2020-07-16 DIAGNOSIS — G301 Alzheimer's disease with late onset: Secondary | ICD-10-CM

## 2020-07-16 DIAGNOSIS — Z794 Long term (current) use of insulin: Secondary | ICD-10-CM

## 2020-07-16 DIAGNOSIS — I120 Hypertensive chronic kidney disease with stage 5 chronic kidney disease or end stage renal disease: Secondary | ICD-10-CM

## 2020-07-16 DIAGNOSIS — E119 Type 2 diabetes mellitus without complications: Secondary | ICD-10-CM

## 2020-07-16 DIAGNOSIS — Z8673 Personal history of transient ischemic attack (TIA), and cerebral infarction without residual deficits: Secondary | ICD-10-CM

## 2020-07-16 DIAGNOSIS — I129 Hypertensive chronic kidney disease with stage 1 through stage 4 chronic kidney disease, or unspecified chronic kidney disease: Secondary | ICD-10-CM

## 2020-07-16 DIAGNOSIS — I10 Essential (primary) hypertension: Secondary | ICD-10-CM

## 2020-07-16 DIAGNOSIS — K2100 Gastro-esophageal reflux disease with esophagitis, without bleeding: Secondary | ICD-10-CM

## 2020-07-16 MED ORDER — PANTOPRAZOLE SODIUM 40 MG PO TBEC: 40.0000 mg | DELAYED_RELEASE_TABLET | Freq: Every day | ORAL | 3 refills | Status: DC

## 2020-07-16 NOTE — Progress Notes (Addendum)
Established Patient Office Visit  Subjective:  Patient ID: Joseph Mcpherson, male    DOB: 1946/01/06  Age: 75 y.o. MRN: [id number]  CC:  Chief Complaint  Patient presents with   Hypertension    HPI  Toan Mort presents for GERD he has been taking Nexium 40 mg p.o. twice a day.  I stopped the Nexium and started him on Protonix 40 mg p.o. daily.  He denies any chest pain shortness of breath.  Blood pressure is stable.  Past Medical History:  Diagnosis Date   Chronic kidney disease    Clotting disorder ([place])    per daughter, "when patient travels, his feet swell"   Diabetes mellitus without complication ([place])    Diabetic eye exam ([place]) 12/2019   GERD (gastroesophageal reflux disease)    Hyperlipidemia    Hypertension    Hypothyroidism    Thyroid disease     Past Surgical History:  Procedure Laterality Date   AV FISTULA PLACEMENT Right 10/22/2017   Procedure: ARTERIOVENOUS (AV) BRACHIOCEPHALIC FISTULA CREATION RIGHT UPPER ARM;  Surgeon: Conrad Edgewater, MD;  Location: [facility];  Service: Vascular;  Laterality: Right;   AV FISTULA PLACEMENT Left 02/03/2019   Procedure: INSERTION OF ARTERIOVENOUS (AV) GORE-TEX GRAFT ARM (BRACHIAL AXILLARY );  Surgeon: [name], MD;  Location: [facility];  Service: Vascular;  Laterality: Left;   UPPER EXTREMITY ANGIOGRAPHY Left 09/19/2019   Procedure: UPPER EXTREMITY ANGIOGRAPHY;  Surgeon: [name], MD;  Location: [facility];  Service: Cardiovascular;  Laterality: Left;    Family History  Problem Relation Age of Onset   Diabetes Mother    Hyperlipidemia Mother    Hypertension Father     Social History   Socioeconomic History   Marital status: Married    Spouse name: Not on file   Number of children: Not on file   Years of education: Not on file   Highest education level: Not on file  Occupational History   Not on file  Tobacco Use   Smoking status: Never Smoker   Smokeless tobacco: Former Systems developer     Types: Snuff  Vaping Use   Vaping Use: Never used  Substance and Sexual Activity   Alcohol use: No   Drug use: No   Sexual activity: Not Currently  Other Topics Concern   Not on file  Social History Narrative   Not on file   Social Determinants of Health   Financial Resource Strain: Not on file  Food Insecurity: Not on file  Transportation Needs: Not on file  Physical Activity: Not on file  Stress: Not on file  Social Connections: Not on file  Intimate Partner Violence: Not on file     Current Outpatient Medications:    aspirin EC 325 MG EC tablet, Take 1 tablet (325 mg total) by mouth daily., Disp: 30 tablet, Rfl: 3   B Complex-C-Folic Acid (RENA-VITE RX) 1 MG TABS, Take 1 tablet by mouth daily. , Disp: , Rfl:    Calcium Acetate 668 (169 Ca) MG TABS, Take 667 mg by mouth in the morning, at noon, and at bedtime., Disp: , Rfl:    calcium acetate, Phos Binder, (PHOSLYRA) 667 MG/5ML SOLN, Take by mouth 3 (three) times daily with meals., Disp: , Rfl:    Cholecalciferol (VITAMIN D3) 5000 units TABS, Take 5,000 Units by mouth daily. , Disp: , Rfl:    Continuous Blood Gluc Sensor (FREESTYLE LIBRE 14 DAY SENSOR) MISC, 1 each by Does not apply  route every 14 (fourteen) days., Disp: 3 each, Rfl: 3   docusate sodium (COLACE) 100 MG capsule, Take 1 capsule (100 mg total) by mouth 2 (two) times daily., Disp: 10 capsule, Rfl: 6   donepezil (ARICEPT) 10 MG tablet, Take 10 mg by mouth at bedtime. , Disp: , Rfl:    gabapentin (NEURONTIN) 300 MG capsule, Take 300 mg by mouth at bedtime. , Disp: , Rfl:    hydrALAZINE (APRESOLINE) 50 MG tablet, Take 1 tablet (50 mg total) by mouth 3 (three) times daily. (Patient taking differently: Take 25 mg by mouth 3 (three) times daily.), Disp: 90 tablet, Rfl: 6   insulin aspart (NOVOLOG) 100 UNIT/ML FlexPen, Inject 8 Units into the skin 2 (two) times daily. , Disp: , Rfl:    insulin detemir (LEVEMIR) 100 UNIT/ML injection, Inject 16 Units  into the skin at bedtime. , Disp: , Rfl:    levothyroxine (SYNTHROID) 137 MCG tablet, Take 1 tablet (137 mcg total) by mouth daily., Disp: 90 tablet, Rfl: 3   losartan (COZAAR) 25 MG tablet, Take 1 tablet (25 mg total) by mouth every evening., Disp: 90 tablet, Rfl: 3   metoprolol succinate (TOPROL-XL) 50 MG 24 hr tablet, Take 1 tablet (50 mg total) by mouth daily., Disp: 90 tablet, Rfl: 3   omeprazole (PRILOSEC) 40 MG capsule, Take 1 capsule (40 mg total) by mouth daily., Disp: 30 capsule, Rfl: 6   pantoprazole (PROTONIX) 40 MG tablet, Take 1 tablet (40 mg total) by mouth daily., Disp: 90 tablet, Rfl: 3   sevelamer carbonate (RENVELA) 2.4 g PACK, Take 2.4 g by mouth 3 (three) times daily., Disp: , Rfl:    simvastatin (ZOCOR) 20 MG tablet, Take 1 tablet (20 mg total) by mouth every evening., Disp: 90 tablet, Rfl: 3   sitaGLIPtin (JANUVIA) 50 MG tablet, Take 50 mg by mouth every evening. , Disp: , Rfl:    No Known Allergies  ROS Review of Systems  Constitutional: Negative.   HENT: Negative.   Eyes: Negative.   Respiratory: Negative.   Cardiovascular: Negative.   Gastrointestinal: Negative.   Endocrine: Negative.   Genitourinary: Negative.   Musculoskeletal: Negative.   Skin: Negative.   Allergic/Immunologic: Negative.   Neurological: Negative.   Hematological: Negative.   Psychiatric/Behavioral: Negative.   All other systems reviewed and are negative.     Objective:    Physical Exam Vitals reviewed.  Constitutional:      Appearance: Normal appearance.  HENT:     Mouth/Throat:     Mouth: Mucous membranes are moist.  Eyes:     Pupils: Pupils are equal, round, and reactive to light.  Neck:     Vascular: No carotid bruit.  Cardiovascular:     Rate and Rhythm: Normal rate and regular rhythm.     Pulses: Normal pulses.     Heart sounds: Normal heart sounds.  Pulmonary:     Effort: Pulmonary effort is normal.     Breath sounds: Normal breath sounds.  Abdominal:      General: Bowel sounds are normal.     Palpations: Abdomen is soft. There is no hepatomegaly, splenomegaly or mass.     Tenderness: There is no abdominal tenderness.     Hernia: No hernia is present.  Musculoskeletal:     Cervical back: Neck supple.     Right lower leg: No edema.     Left lower leg: No edema.  Skin:    Findings: No rash.  Neurological:  Mental Status: He is alert and oriented to person, place, and time.     Motor: No weakness.  Psychiatric:        Mood and Affect: Mood normal.        Behavior: Behavior normal.     BP 100/70    Pulse 80    Ht 5\' 4"  (1.626 m)    Wt 155 lb 14.4 oz (70.7 kg)    HC 98" (248.9 cm)    BMI 26.76 kg/m  Wt Readings from Last 3 Encounters:  07/17/20 155 lb 14.4 oz (70.7 kg)  03/12/20 152 lb 4.8 oz (69.1 kg)  09/19/19 150 lb (68 kg)     Health Maintenance Due  Topic Date Due   Hepatitis C Screening  Never done   OPHTHALMOLOGY EXAM  Never done   TETANUS/TDAP  Never done   COLONOSCOPY (Pts 45-42yrs Insurance coverage will need to be confirmed)  Never done   PNA vac Low Risk Adult (2 of 2 - PCV13) 11/19/2015   HEMOGLOBIN A1C  11/03/2019   INFLUENZA VACCINE  12/24/2019   FOOT EXAM  03/08/2020    There are no preventive care reminders to display for this patient.  Lab Results  Component Value Date   TSH 0.525 09/13/2013   Lab Results  Component Value Date   WBC 11.8 (H) 05/04/2019   HGB 10.8 (L) 05/04/2019   HCT 33.2 (L) 05/04/2019   MCV 95.1 05/04/2019   PLT 236 05/04/2019   Lab Results  Component Value Date   NA 136 05/04/2019   K 5.2 (H) 05/04/2019   CO2 26 05/04/2019   GLUCOSE 190 (H) 05/04/2019   BUN 35 (H) 05/04/2019   CREATININE 7.68 (H) 05/04/2019   BILITOT 0.4 09/11/2013   ALKPHOS 134 (H) 09/11/2013   AST 32 09/11/2013   ALT 39 09/11/2013   PROT 8.1 09/11/2013   ALBUMIN 2.8 (L) 09/13/2013   CALCIUM 9.0 05/04/2019   ANIONGAP 15 05/04/2019   Lab Results  Component Value Date   CHOL 144  05/05/2019   Lab Results  Component Value Date   HDL 29 (L) 05/05/2019   Lab Results  Component Value Date   LDLCALC 55 05/05/2019   Lab Results  Component Value Date   TRIG 301 (H) 05/05/2019   Lab Results  Component Value Date   CHOLHDL 5.0 05/05/2019   Lab Results  Component Value Date   HGBA1C 6.7 (H) 05/05/2019      Assessment & Plan:    Problem List Items Addressed This Visit      Digestive   Gastroesophageal reflux disease with esophagitis - Primary   Relevant Medications   pantoprazole (PROTONIX) 40 MG tablet     Endocrine   Insulin dependent type 2 diabetes mellitus ([place])    Patient has a 24-hour monitor to keep his blood sugar under control.  He says he follows his diet.        Nervous and Auditory   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[place])      stable.        Genitourinary   Malignant hypertensive renal disease    Patient is on dialysis and under care.  Nephrologist.        Other   History of CVA (cerebrovascular accident)    There is no recurrence of TIA.  Patient can walk without the help of a stick.       Patient reflux is not under control so he was started  on Protonix.  40 mg daily he was advised to stop taking it  Meds ordered this encounter  Medications   pantoprazole (PROTONIX) 40 MG tablet    Sig: Take 1 tablet (40 mg total) by mouth daily.    Dispense:  90 tablet    Refill:  3    Follow-up: No follow-ups on file.    [name], MD

## 2020-07-16 NOTE — Assessment & Plan Note (Signed)
Patient has a 24-hour monitor to keep his blood sugar under control.  He says he follows his diet.

## 2020-07-16 NOTE — Assessment & Plan Note (Signed)
Blood pressure stable on the present medication. 

## 2020-07-16 NOTE — Assessment & Plan Note (Addendum)
stable °

## 2020-07-16 NOTE — Assessment & Plan Note (Signed)
There is no recurrence of TIA.  Patient can walk without the help of a stick.

## 2020-07-17 ENCOUNTER — Encounter: Payer: Self-pay | Admitting: Internal Medicine

## 2020-07-17 DIAGNOSIS — N186 End stage renal disease: Secondary | ICD-10-CM | POA: Diagnosis not present

## 2020-07-17 DIAGNOSIS — Z992 Dependence on renal dialysis: Secondary | ICD-10-CM | POA: Diagnosis not present

## 2020-07-17 DIAGNOSIS — N2581 Secondary hyperparathyroidism of renal origin: Secondary | ICD-10-CM | POA: Diagnosis not present

## 2020-07-18 DIAGNOSIS — I129 Hypertensive chronic kidney disease with stage 1 through stage 4 chronic kidney disease, or unspecified chronic kidney disease: Secondary | ICD-10-CM | POA: Insufficient documentation

## 2020-07-18 DIAGNOSIS — K2100 Gastro-esophageal reflux disease with esophagitis, without bleeding: Secondary | ICD-10-CM | POA: Insufficient documentation

## 2020-07-18 NOTE — Assessment & Plan Note (Signed)
Patient is on dialysis and under care.  Nephrologist.

## 2020-07-18 NOTE — Assessment & Plan Note (Signed)
Patient is on dialysis and under the care of a nephrologist.

## 2020-07-19 DIAGNOSIS — Z992 Dependence on renal dialysis: Secondary | ICD-10-CM | POA: Diagnosis not present

## 2020-07-19 DIAGNOSIS — N2581 Secondary hyperparathyroidism of renal origin: Secondary | ICD-10-CM | POA: Diagnosis not present

## 2020-07-19 DIAGNOSIS — N186 End stage renal disease: Secondary | ICD-10-CM | POA: Diagnosis not present

## 2020-07-22 DIAGNOSIS — N2581 Secondary hyperparathyroidism of renal origin: Secondary | ICD-10-CM | POA: Diagnosis not present

## 2020-07-22 DIAGNOSIS — N186 End stage renal disease: Secondary | ICD-10-CM | POA: Diagnosis not present

## 2020-07-22 DIAGNOSIS — Z992 Dependence on renal dialysis: Secondary | ICD-10-CM | POA: Diagnosis not present

## 2020-07-22 DIAGNOSIS — E1122 Type 2 diabetes mellitus with diabetic chronic kidney disease: Secondary | ICD-10-CM | POA: Diagnosis not present

## 2020-07-24 DIAGNOSIS — Z992 Dependence on renal dialysis: Secondary | ICD-10-CM | POA: Diagnosis not present

## 2020-07-24 DIAGNOSIS — N186 End stage renal disease: Secondary | ICD-10-CM | POA: Diagnosis not present

## 2020-07-24 DIAGNOSIS — N2581 Secondary hyperparathyroidism of renal origin: Secondary | ICD-10-CM | POA: Diagnosis not present

## 2020-07-26 DIAGNOSIS — Z992 Dependence on renal dialysis: Secondary | ICD-10-CM | POA: Diagnosis not present

## 2020-07-26 DIAGNOSIS — N2581 Secondary hyperparathyroidism of renal origin: Secondary | ICD-10-CM | POA: Diagnosis not present

## 2020-07-26 DIAGNOSIS — N186 End stage renal disease: Secondary | ICD-10-CM | POA: Diagnosis not present

## 2020-07-29 DIAGNOSIS — N186 End stage renal disease: Secondary | ICD-10-CM | POA: Diagnosis not present

## 2020-07-29 DIAGNOSIS — N2581 Secondary hyperparathyroidism of renal origin: Secondary | ICD-10-CM | POA: Diagnosis not present

## 2020-07-29 DIAGNOSIS — Z992 Dependence on renal dialysis: Secondary | ICD-10-CM | POA: Diagnosis not present

## 2020-07-31 DIAGNOSIS — N186 End stage renal disease: Secondary | ICD-10-CM | POA: Diagnosis not present

## 2020-07-31 DIAGNOSIS — N2581 Secondary hyperparathyroidism of renal origin: Secondary | ICD-10-CM | POA: Diagnosis not present

## 2020-07-31 DIAGNOSIS — Z992 Dependence on renal dialysis: Secondary | ICD-10-CM | POA: Diagnosis not present

## 2020-08-02 DIAGNOSIS — N186 End stage renal disease: Secondary | ICD-10-CM | POA: Diagnosis not present

## 2020-08-02 DIAGNOSIS — Z992 Dependence on renal dialysis: Secondary | ICD-10-CM | POA: Diagnosis not present

## 2020-08-02 DIAGNOSIS — N2581 Secondary hyperparathyroidism of renal origin: Secondary | ICD-10-CM | POA: Diagnosis not present

## 2020-08-05 ENCOUNTER — Other Ambulatory Visit: Payer: Self-pay | Admitting: Internal Medicine

## 2020-08-05 DIAGNOSIS — Z992 Dependence on renal dialysis: Secondary | ICD-10-CM | POA: Diagnosis not present

## 2020-08-05 DIAGNOSIS — N186 End stage renal disease: Secondary | ICD-10-CM | POA: Diagnosis not present

## 2020-08-05 DIAGNOSIS — N2581 Secondary hyperparathyroidism of renal origin: Secondary | ICD-10-CM | POA: Diagnosis not present

## 2020-08-07 DIAGNOSIS — N2581 Secondary hyperparathyroidism of renal origin: Secondary | ICD-10-CM | POA: Diagnosis not present

## 2020-08-07 DIAGNOSIS — N186 End stage renal disease: Secondary | ICD-10-CM | POA: Diagnosis not present

## 2020-08-07 DIAGNOSIS — Z992 Dependence on renal dialysis: Secondary | ICD-10-CM | POA: Diagnosis not present

## 2020-08-09 DIAGNOSIS — Z992 Dependence on renal dialysis: Secondary | ICD-10-CM | POA: Diagnosis not present

## 2020-08-09 DIAGNOSIS — N2581 Secondary hyperparathyroidism of renal origin: Secondary | ICD-10-CM | POA: Diagnosis not present

## 2020-08-09 DIAGNOSIS — N186 End stage renal disease: Secondary | ICD-10-CM | POA: Diagnosis not present

## 2020-08-12 DIAGNOSIS — N186 End stage renal disease: Secondary | ICD-10-CM | POA: Diagnosis not present

## 2020-08-12 DIAGNOSIS — N2581 Secondary hyperparathyroidism of renal origin: Secondary | ICD-10-CM | POA: Diagnosis not present

## 2020-08-12 DIAGNOSIS — Z992 Dependence on renal dialysis: Secondary | ICD-10-CM | POA: Diagnosis not present

## 2020-08-14 ENCOUNTER — Other Ambulatory Visit: Payer: Self-pay | Admitting: Internal Medicine

## 2020-08-14 DIAGNOSIS — N2581 Secondary hyperparathyroidism of renal origin: Secondary | ICD-10-CM | POA: Diagnosis not present

## 2020-08-14 DIAGNOSIS — Z992 Dependence on renal dialysis: Secondary | ICD-10-CM | POA: Diagnosis not present

## 2020-08-14 DIAGNOSIS — N186 End stage renal disease: Secondary | ICD-10-CM | POA: Diagnosis not present

## 2020-08-16 DIAGNOSIS — N2581 Secondary hyperparathyroidism of renal origin: Secondary | ICD-10-CM | POA: Diagnosis not present

## 2020-08-16 DIAGNOSIS — N186 End stage renal disease: Secondary | ICD-10-CM | POA: Diagnosis not present

## 2020-08-16 DIAGNOSIS — Z992 Dependence on renal dialysis: Secondary | ICD-10-CM | POA: Diagnosis not present

## 2020-08-19 DIAGNOSIS — N2581 Secondary hyperparathyroidism of renal origin: Secondary | ICD-10-CM | POA: Diagnosis not present

## 2020-08-19 DIAGNOSIS — Z992 Dependence on renal dialysis: Secondary | ICD-10-CM | POA: Diagnosis not present

## 2020-08-19 DIAGNOSIS — N186 End stage renal disease: Secondary | ICD-10-CM | POA: Diagnosis not present

## 2020-08-21 DIAGNOSIS — N2581 Secondary hyperparathyroidism of renal origin: Secondary | ICD-10-CM | POA: Diagnosis not present

## 2020-08-21 DIAGNOSIS — Z992 Dependence on renal dialysis: Secondary | ICD-10-CM | POA: Diagnosis not present

## 2020-08-21 DIAGNOSIS — N186 End stage renal disease: Secondary | ICD-10-CM | POA: Diagnosis not present

## 2020-08-22 DIAGNOSIS — N186 End stage renal disease: Secondary | ICD-10-CM | POA: Diagnosis not present

## 2020-08-22 DIAGNOSIS — E1122 Type 2 diabetes mellitus with diabetic chronic kidney disease: Secondary | ICD-10-CM | POA: Diagnosis not present

## 2020-08-22 DIAGNOSIS — Z992 Dependence on renal dialysis: Secondary | ICD-10-CM | POA: Diagnosis not present

## 2020-08-23 DIAGNOSIS — Z992 Dependence on renal dialysis: Secondary | ICD-10-CM | POA: Diagnosis not present

## 2020-08-23 DIAGNOSIS — N186 End stage renal disease: Secondary | ICD-10-CM | POA: Diagnosis not present

## 2020-08-23 DIAGNOSIS — N2581 Secondary hyperparathyroidism of renal origin: Secondary | ICD-10-CM | POA: Diagnosis not present

## 2020-08-26 DIAGNOSIS — N2581 Secondary hyperparathyroidism of renal origin: Secondary | ICD-10-CM | POA: Diagnosis not present

## 2020-08-26 DIAGNOSIS — Z992 Dependence on renal dialysis: Secondary | ICD-10-CM | POA: Diagnosis not present

## 2020-08-26 DIAGNOSIS — N186 End stage renal disease: Secondary | ICD-10-CM | POA: Diagnosis not present

## 2020-08-28 DIAGNOSIS — N186 End stage renal disease: Secondary | ICD-10-CM | POA: Diagnosis not present

## 2020-08-28 DIAGNOSIS — Z992 Dependence on renal dialysis: Secondary | ICD-10-CM | POA: Diagnosis not present

## 2020-08-28 DIAGNOSIS — N2581 Secondary hyperparathyroidism of renal origin: Secondary | ICD-10-CM | POA: Diagnosis not present

## 2020-08-30 DIAGNOSIS — N186 End stage renal disease: Secondary | ICD-10-CM | POA: Diagnosis not present

## 2020-08-30 DIAGNOSIS — Z992 Dependence on renal dialysis: Secondary | ICD-10-CM | POA: Diagnosis not present

## 2020-08-30 DIAGNOSIS — N2581 Secondary hyperparathyroidism of renal origin: Secondary | ICD-10-CM | POA: Diagnosis not present

## 2020-09-02 DIAGNOSIS — N186 End stage renal disease: Secondary | ICD-10-CM | POA: Diagnosis not present

## 2020-09-02 DIAGNOSIS — Z992 Dependence on renal dialysis: Secondary | ICD-10-CM | POA: Diagnosis not present

## 2020-09-02 DIAGNOSIS — N2581 Secondary hyperparathyroidism of renal origin: Secondary | ICD-10-CM | POA: Diagnosis not present

## 2020-09-04 DIAGNOSIS — N186 End stage renal disease: Secondary | ICD-10-CM | POA: Diagnosis not present

## 2020-09-04 DIAGNOSIS — N2581 Secondary hyperparathyroidism of renal origin: Secondary | ICD-10-CM | POA: Diagnosis not present

## 2020-09-04 DIAGNOSIS — Z992 Dependence on renal dialysis: Secondary | ICD-10-CM | POA: Diagnosis not present

## 2020-09-06 DIAGNOSIS — Z992 Dependence on renal dialysis: Secondary | ICD-10-CM | POA: Diagnosis not present

## 2020-09-06 DIAGNOSIS — N2581 Secondary hyperparathyroidism of renal origin: Secondary | ICD-10-CM | POA: Diagnosis not present

## 2020-09-06 DIAGNOSIS — N186 End stage renal disease: Secondary | ICD-10-CM | POA: Diagnosis not present

## 2020-09-09 DIAGNOSIS — Z992 Dependence on renal dialysis: Secondary | ICD-10-CM | POA: Diagnosis not present

## 2020-09-09 DIAGNOSIS — N186 End stage renal disease: Secondary | ICD-10-CM | POA: Diagnosis not present

## 2020-09-09 DIAGNOSIS — N2581 Secondary hyperparathyroidism of renal origin: Secondary | ICD-10-CM | POA: Diagnosis not present

## 2020-09-10 DIAGNOSIS — G5622 Lesion of ulnar nerve, left upper limb: Secondary | ICD-10-CM | POA: Diagnosis not present

## 2020-09-11 DIAGNOSIS — N2581 Secondary hyperparathyroidism of renal origin: Secondary | ICD-10-CM | POA: Diagnosis not present

## 2020-09-11 DIAGNOSIS — N186 End stage renal disease: Secondary | ICD-10-CM | POA: Diagnosis not present

## 2020-09-11 DIAGNOSIS — Z992 Dependence on renal dialysis: Secondary | ICD-10-CM | POA: Diagnosis not present

## 2020-09-13 DIAGNOSIS — N2581 Secondary hyperparathyroidism of renal origin: Secondary | ICD-10-CM | POA: Diagnosis not present

## 2020-09-13 DIAGNOSIS — Z992 Dependence on renal dialysis: Secondary | ICD-10-CM | POA: Diagnosis not present

## 2020-09-13 DIAGNOSIS — N186 End stage renal disease: Secondary | ICD-10-CM | POA: Diagnosis not present

## 2020-09-16 DIAGNOSIS — N2581 Secondary hyperparathyroidism of renal origin: Secondary | ICD-10-CM | POA: Diagnosis not present

## 2020-09-16 DIAGNOSIS — N186 End stage renal disease: Secondary | ICD-10-CM | POA: Diagnosis not present

## 2020-09-16 DIAGNOSIS — Z992 Dependence on renal dialysis: Secondary | ICD-10-CM | POA: Diagnosis not present

## 2020-09-18 DIAGNOSIS — N2581 Secondary hyperparathyroidism of renal origin: Secondary | ICD-10-CM | POA: Diagnosis not present

## 2020-09-18 DIAGNOSIS — Z992 Dependence on renal dialysis: Secondary | ICD-10-CM | POA: Diagnosis not present

## 2020-09-18 DIAGNOSIS — N186 End stage renal disease: Secondary | ICD-10-CM | POA: Diagnosis not present

## 2020-09-20 DIAGNOSIS — Z992 Dependence on renal dialysis: Secondary | ICD-10-CM | POA: Diagnosis not present

## 2020-09-20 DIAGNOSIS — N186 End stage renal disease: Secondary | ICD-10-CM | POA: Diagnosis not present

## 2020-09-20 DIAGNOSIS — N2581 Secondary hyperparathyroidism of renal origin: Secondary | ICD-10-CM | POA: Diagnosis not present

## 2020-09-21 DIAGNOSIS — Z992 Dependence on renal dialysis: Secondary | ICD-10-CM | POA: Diagnosis not present

## 2020-09-21 DIAGNOSIS — N186 End stage renal disease: Secondary | ICD-10-CM | POA: Diagnosis not present

## 2020-09-21 DIAGNOSIS — E1122 Type 2 diabetes mellitus with diabetic chronic kidney disease: Secondary | ICD-10-CM | POA: Diagnosis not present

## 2020-09-23 DIAGNOSIS — N186 End stage renal disease: Secondary | ICD-10-CM | POA: Diagnosis not present

## 2020-09-23 DIAGNOSIS — Z992 Dependence on renal dialysis: Secondary | ICD-10-CM | POA: Diagnosis not present

## 2020-09-23 DIAGNOSIS — N2581 Secondary hyperparathyroidism of renal origin: Secondary | ICD-10-CM | POA: Diagnosis not present

## 2020-09-24 ENCOUNTER — Other Ambulatory Visit: Payer: Self-pay | Admitting: Internal Medicine

## 2020-09-25 DIAGNOSIS — Z992 Dependence on renal dialysis: Secondary | ICD-10-CM | POA: Diagnosis not present

## 2020-09-25 DIAGNOSIS — N186 End stage renal disease: Secondary | ICD-10-CM | POA: Diagnosis not present

## 2020-09-25 DIAGNOSIS — N2581 Secondary hyperparathyroidism of renal origin: Secondary | ICD-10-CM | POA: Diagnosis not present

## 2020-09-30 DIAGNOSIS — T82868A Thrombosis of vascular prosthetic devices, implants and grafts, initial encounter: Secondary | ICD-10-CM | POA: Diagnosis not present

## 2020-09-30 DIAGNOSIS — N186 End stage renal disease: Secondary | ICD-10-CM | POA: Diagnosis not present

## 2020-09-30 DIAGNOSIS — Z992 Dependence on renal dialysis: Secondary | ICD-10-CM | POA: Diagnosis not present

## 2020-09-30 DIAGNOSIS — T82858A Stenosis of vascular prosthetic devices, implants and grafts, initial encounter: Secondary | ICD-10-CM | POA: Diagnosis not present

## 2020-10-01 DIAGNOSIS — N2581 Secondary hyperparathyroidism of renal origin: Secondary | ICD-10-CM | POA: Diagnosis not present

## 2020-10-01 DIAGNOSIS — N186 End stage renal disease: Secondary | ICD-10-CM | POA: Diagnosis not present

## 2020-10-01 DIAGNOSIS — Z992 Dependence on renal dialysis: Secondary | ICD-10-CM | POA: Diagnosis not present

## 2020-10-02 DIAGNOSIS — Z992 Dependence on renal dialysis: Secondary | ICD-10-CM | POA: Diagnosis not present

## 2020-10-02 DIAGNOSIS — N186 End stage renal disease: Secondary | ICD-10-CM | POA: Diagnosis not present

## 2020-10-02 DIAGNOSIS — N2581 Secondary hyperparathyroidism of renal origin: Secondary | ICD-10-CM | POA: Diagnosis not present

## 2020-10-04 DIAGNOSIS — N2581 Secondary hyperparathyroidism of renal origin: Secondary | ICD-10-CM | POA: Diagnosis not present

## 2020-10-04 DIAGNOSIS — Z992 Dependence on renal dialysis: Secondary | ICD-10-CM | POA: Diagnosis not present

## 2020-10-04 DIAGNOSIS — N186 End stage renal disease: Secondary | ICD-10-CM | POA: Diagnosis not present

## 2020-10-07 DIAGNOSIS — N2581 Secondary hyperparathyroidism of renal origin: Secondary | ICD-10-CM | POA: Diagnosis not present

## 2020-10-07 DIAGNOSIS — Z992 Dependence on renal dialysis: Secondary | ICD-10-CM | POA: Diagnosis not present

## 2020-10-07 DIAGNOSIS — N186 End stage renal disease: Secondary | ICD-10-CM | POA: Diagnosis not present

## 2020-10-09 DIAGNOSIS — N186 End stage renal disease: Secondary | ICD-10-CM | POA: Diagnosis not present

## 2020-10-09 DIAGNOSIS — Z992 Dependence on renal dialysis: Secondary | ICD-10-CM | POA: Diagnosis not present

## 2020-10-09 DIAGNOSIS — N2581 Secondary hyperparathyroidism of renal origin: Secondary | ICD-10-CM | POA: Diagnosis not present

## 2020-10-11 DIAGNOSIS — N2581 Secondary hyperparathyroidism of renal origin: Secondary | ICD-10-CM | POA: Diagnosis not present

## 2020-10-11 DIAGNOSIS — N186 End stage renal disease: Secondary | ICD-10-CM | POA: Diagnosis not present

## 2020-10-11 DIAGNOSIS — Z992 Dependence on renal dialysis: Secondary | ICD-10-CM | POA: Diagnosis not present

## 2020-10-14 DIAGNOSIS — Z992 Dependence on renal dialysis: Secondary | ICD-10-CM | POA: Diagnosis not present

## 2020-10-14 DIAGNOSIS — N2581 Secondary hyperparathyroidism of renal origin: Secondary | ICD-10-CM | POA: Diagnosis not present

## 2020-10-14 DIAGNOSIS — N186 End stage renal disease: Secondary | ICD-10-CM | POA: Diagnosis not present

## 2020-10-16 DIAGNOSIS — Z992 Dependence on renal dialysis: Secondary | ICD-10-CM | POA: Diagnosis not present

## 2020-10-16 DIAGNOSIS — N186 End stage renal disease: Secondary | ICD-10-CM | POA: Diagnosis not present

## 2020-10-18 DIAGNOSIS — N186 End stage renal disease: Secondary | ICD-10-CM | POA: Diagnosis not present

## 2020-10-18 DIAGNOSIS — Z992 Dependence on renal dialysis: Secondary | ICD-10-CM | POA: Diagnosis not present

## 2020-10-21 DIAGNOSIS — N2581 Secondary hyperparathyroidism of renal origin: Secondary | ICD-10-CM | POA: Diagnosis not present

## 2020-10-21 DIAGNOSIS — N186 End stage renal disease: Secondary | ICD-10-CM | POA: Diagnosis not present

## 2020-10-21 DIAGNOSIS — Z992 Dependence on renal dialysis: Secondary | ICD-10-CM | POA: Diagnosis not present

## 2020-10-22 DIAGNOSIS — E1122 Type 2 diabetes mellitus with diabetic chronic kidney disease: Secondary | ICD-10-CM | POA: Diagnosis not present

## 2020-10-22 DIAGNOSIS — N186 End stage renal disease: Secondary | ICD-10-CM | POA: Diagnosis not present

## 2020-10-22 DIAGNOSIS — Z992 Dependence on renal dialysis: Secondary | ICD-10-CM | POA: Diagnosis not present

## 2020-10-23 DIAGNOSIS — Z992 Dependence on renal dialysis: Secondary | ICD-10-CM | POA: Diagnosis not present

## 2020-10-23 DIAGNOSIS — N186 End stage renal disease: Secondary | ICD-10-CM | POA: Diagnosis not present

## 2020-10-23 DIAGNOSIS — N2581 Secondary hyperparathyroidism of renal origin: Secondary | ICD-10-CM | POA: Diagnosis not present

## 2020-10-25 DIAGNOSIS — Z992 Dependence on renal dialysis: Secondary | ICD-10-CM | POA: Diagnosis not present

## 2020-10-25 DIAGNOSIS — N186 End stage renal disease: Secondary | ICD-10-CM | POA: Diagnosis not present

## 2020-10-25 DIAGNOSIS — N2581 Secondary hyperparathyroidism of renal origin: Secondary | ICD-10-CM | POA: Diagnosis not present

## 2020-10-28 ENCOUNTER — Ambulatory Visit (INDEPENDENT_AMBULATORY_CARE_PROVIDER_SITE_OTHER): Payer: Medicare HMO | Admitting: Internal Medicine

## 2020-10-28 ENCOUNTER — Other Ambulatory Visit: Payer: Self-pay

## 2020-10-28 ENCOUNTER — Encounter: Payer: Self-pay | Admitting: Internal Medicine

## 2020-10-28 VITALS — BP 156/63 | HR 63 | Ht 64.0 in | Wt 149.9 lb

## 2020-10-28 DIAGNOSIS — E0849 Diabetes mellitus due to underlying condition with other diabetic neurological complication: Secondary | ICD-10-CM

## 2020-10-28 DIAGNOSIS — K2100 Gastro-esophageal reflux disease with esophagitis, without bleeding: Secondary | ICD-10-CM | POA: Diagnosis not present

## 2020-10-28 DIAGNOSIS — E119 Type 2 diabetes mellitus without complications: Secondary | ICD-10-CM

## 2020-10-28 DIAGNOSIS — N186 End stage renal disease: Secondary | ICD-10-CM

## 2020-10-28 DIAGNOSIS — Z794 Long term (current) use of insulin: Secondary | ICD-10-CM | POA: Diagnosis not present

## 2020-10-28 DIAGNOSIS — G301 Alzheimer's disease with late onset: Secondary | ICD-10-CM | POA: Diagnosis not present

## 2020-10-28 DIAGNOSIS — T7840XA Allergy, unspecified, initial encounter: Secondary | ICD-10-CM | POA: Insufficient documentation

## 2020-10-28 DIAGNOSIS — I10 Essential (primary) hypertension: Secondary | ICD-10-CM

## 2020-10-28 DIAGNOSIS — E039 Hypothyroidism, unspecified: Secondary | ICD-10-CM | POA: Diagnosis not present

## 2020-10-28 DIAGNOSIS — N2581 Secondary hyperparathyroidism of renal origin: Secondary | ICD-10-CM | POA: Diagnosis not present

## 2020-10-28 DIAGNOSIS — E1149 Type 2 diabetes mellitus with other diabetic neurological complication: Secondary | ICD-10-CM | POA: Diagnosis not present

## 2020-10-28 DIAGNOSIS — E1122 Type 2 diabetes mellitus with diabetic chronic kidney disease: Secondary | ICD-10-CM | POA: Diagnosis not present

## 2020-10-28 DIAGNOSIS — Z992 Dependence on renal dialysis: Secondary | ICD-10-CM | POA: Diagnosis not present

## 2020-10-28 MED ORDER — LEVOCETIRIZINE DIHYDROCHLORIDE 5 MG PO TABS: 5.0000 mg | ORAL_TABLET | Freq: Every evening | ORAL | 1 refills | Status: DC

## 2020-10-28 MED ORDER — ALBUTEROL SULFATE (2.5 MG/3ML) 0.083% IN NEBU
2.5000 mg | INHALATION_SOLUTION | Freq: Four times a day (QID) | RESPIRATORY_TRACT | 1 refills | Status: DC | PRN
Start: 2020-10-28 — End: 2022-03-10

## 2020-10-28 NOTE — Assessment & Plan Note (Signed)
Stable at the present time, patient drives in the daytime only

## 2020-10-28 NOTE — Assessment & Plan Note (Signed)
Stable at the present time. 

## 2020-10-28 NOTE — Assessment & Plan Note (Signed)
-   The patient's GERD is stable on medication.  - Instructed the patient to avoid eating spicy and acidic foods, as well as foods high in fat. - Instructed the patient to avoid eating large meals or meals 2-3 hours prior to sleeping. 

## 2020-10-28 NOTE — Progress Notes (Signed)
Established Patient Office Visit  Subjective:  Patient ID: Joseph Mcpherson, male    DOB: [birth year]  Age: 75 y.o. MRN: [id number]  CC:  Chief Complaint  Patient presents with  . Follow-up    HPI  [name] presents for for cough and wheezing, he is known to have diabetic neuropathy.  He is also on dialysis.  Patient has a problem include hyperlipidemia hypertension with hypertensive cardiovascular disease and hypothyroidism  Past Medical History:  Diagnosis Date  . Chronic kidney disease   . Clotting disorder ([place])    per daughter, "when patient travels, his feet swell"  . Diabetes mellitus without complication ([place])   . Diabetic eye exam ([employer]) 12/2019  . GERD (gastroesophageal reflux disease)   . Hyperlipidemia   . Hypertension   . Hypothyroidism   . Thyroid disease     Past Surgical History:  Procedure Laterality Date  . AV FISTULA PLACEMENT Right 10/22/2017   Procedure: ARTERIOVENOUS (AV) BRACHIOCEPHALIC FISTULA CREATION RIGHT UPPER ARM;  Surgeon: Conrad Hartwell, MD;  Location: [facility];  Service: Vascular;  Laterality: Right;  . AV FISTULA PLACEMENT Left 02/03/2019   Procedure: INSERTION OF ARTERIOVENOUS (AV) GORE-TEX GRAFT ARM (BRACHIAL AXILLARY );  Surgeon: [name], MD;  Location: [facility];  Service: Vascular;  Laterality: Left;  . UPPER EXTREMITY ANGIOGRAPHY Left 09/19/2019   Procedure: UPPER EXTREMITY ANGIOGRAPHY;  Surgeon: [name], MD;  Location: [facility];  Service: Cardiovascular;  Laterality: Left;    Family History  Problem Relation Age of Onset  . Diabetes Mother   . Hyperlipidemia Mother   . Hypertension Father     Social History   Socioeconomic History  . Marital status: Married    Spouse name: Not on file  . Number of children: Not on file  . Years of education: Not on file  . Highest education level: Not on file  Occupational History  . Not on file  Tobacco Use  . Smoking status: Never Smoker  . Smokeless tobacco:  Former Systems developer    Types: Snuff  Vaping Use  . Vaping Use: Never used  Substance and Sexual Activity  . Alcohol use: No  . Drug use: No  . Sexual activity: Not Currently  Other Topics Concern  . Not on file  Social History Narrative  . Not on file   Social Determinants of Health   Financial Resource Strain: Not on file  Food Insecurity: Not on file  Transportation Needs: Not on file  Physical Activity: Not on file  Stress: Not on file  Social Connections: Not on file  Intimate Partner Violence: Not on file     Current Outpatient Medications:  .  albuterol (PROVENTIL) (2.5 MG/3ML) 0.083% nebulizer solution, Take 3 mLs (2.5 mg total) by nebulization every 6 (six) hours as needed for wheezing or shortness of breath., Disp: 150 mL, Rfl: 1 .  levocetirizine (XYZAL ALLERGY 24HR) 5 MG tablet, Take 1 tablet (5 mg total) by mouth every evening., Disp: 30 tablet, Rfl: 1 .  aspirin EC 325 MG EC tablet, Take 1 tablet (325 mg total) by mouth daily., Disp: 30 tablet, Rfl: 3 .  B Complex-C-Folic Acid (RENA-VITE RX) 1 MG TABS, Take 1 tablet by mouth daily. , Disp: , Rfl:  .  calcium acetate (PHOSLO) 667 MG capsule, TAKE 2 CAPSULES BY MOUTH 3 TIMES A DAY WITH MEAL, Disp: 180 capsule, Rfl: 4 .  Calcium Acetate 668 (169 Ca) MG TABS, Take 667 mg by  mouth in the morning, at noon, and at bedtime., Disp: , Rfl:  .  calcium acetate, Phos Binder, (PHOSLYRA) 667 MG/5ML SOLN, Take by mouth 3 (three) times daily with meals., Disp: , Rfl:  .  Cholecalciferol (VITAMIN D3) 5000 units TABS, Take 5,000 Units by mouth daily. , Disp: , Rfl:  .  Continuous Blood Gluc Sensor (FREESTYLE LIBRE 14 DAY SENSOR) MISC, 1 each by Does not apply route every 14 (fourteen) days., Disp: 3 each, Rfl: 3 .  docusate sodium (COLACE) 100 MG capsule, TAKE 1 CAPSULE BY MOUTH TWICE A DAY, Disp: 60 capsule, Rfl: 1 .  donepezil (ARICEPT) 10 MG tablet, Take 10 mg by mouth at bedtime. , Disp: , Rfl:  .  gabapentin (NEURONTIN) 300 MG capsule,  Take 300 mg by mouth at bedtime. , Disp: , Rfl:  .  hydrALAZINE (APRESOLINE) 50 MG tablet, Take 1 tablet (50 mg total) by mouth 3 (three) times daily. (Patient taking differently: Take 25 mg by mouth 3 (three) times daily.), Disp: 90 tablet, Rfl: 6 .  insulin aspart (NOVOLOG) 100 UNIT/ML FlexPen, Inject 8 Units into the skin 2 (two) times daily. , Disp: , Rfl:  .  insulin detemir (LEVEMIR) 100 UNIT/ML injection, Inject 16 Units into the skin at bedtime. , Disp: , Rfl:  .  levothyroxine (SYNTHROID) 137 MCG tablet, Take 1 tablet (137 mcg total) by mouth daily., Disp: 90 tablet, Rfl: 3 .  losartan (COZAAR) 25 MG tablet, Take 1 tablet (25 mg total) by mouth every evening., Disp: 90 tablet, Rfl: 3 .  metoprolol succinate (TOPROL-XL) 50 MG 24 hr tablet, Take 1 tablet (50 mg total) by mouth daily., Disp: 90 tablet, Rfl: 3 .  omeprazole (PRILOSEC) 40 MG capsule, TAKE 1 CAPSULE BY MOUTH EVERY DAY, Disp: 90 capsule, Rfl: 2 .  pantoprazole (PROTONIX) 40 MG tablet, Take 1 tablet (40 mg total) by mouth daily., Disp: 90 tablet, Rfl: 3 .  sevelamer carbonate (RENVELA) 2.4 g PACK, Take 2.4 g by mouth 3 (three) times daily., Disp: , Rfl:  .  simvastatin (ZOCOR) 20 MG tablet, Take 1 tablet (20 mg total) by mouth every evening., Disp: 90 tablet, Rfl: 3 .  sitaGLIPtin (JANUVIA) 50 MG tablet, Take 50 mg by mouth every evening. , Disp: , Rfl:    No Known Allergies  ROS Review of Systems  Respiratory: Positive for cough and wheezing. Negative for choking and chest tightness.   Cardiovascular: Negative for leg swelling.  Gastrointestinal: Negative for diarrhea.  Genitourinary: Negative for dysuria.  Musculoskeletal: Negative for gait problem.  Skin: Negative for color change.  Neurological: Negative for headaches.  Psychiatric/Behavioral: Negative for behavioral problems.      Objective:    Physical Exam Vitals reviewed.  Constitutional:      Appearance: Normal appearance.  HENT:     Mouth/Throat:      Mouth: Mucous membranes are moist.  Eyes:     Pupils: Pupils are equal, round, and reactive to light.  Neck:     Vascular: No carotid bruit.  Cardiovascular:     Rate and Rhythm: Normal rate and regular rhythm.     Pulses: Normal pulses.     Heart sounds: Normal heart sounds.  Pulmonary:     Effort: Pulmonary effort is normal.     Breath sounds: Normal breath sounds. No wheezing, rhonchi or rales.  Abdominal:     General: Bowel sounds are normal.     Palpations: Abdomen is soft. There is no hepatomegaly, splenomegaly or  mass.     Tenderness: There is no abdominal tenderness. There is no guarding.     Hernia: No hernia is present.  Musculoskeletal:     Cervical back: Neck supple.     Right lower leg: No edema.     Left lower leg: No edema.  Skin:    Findings: No rash.  Neurological:     Mental Status: He is alert and oriented to person, place, and time.     Motor: No weakness.  Psychiatric:        Mood and Affect: Mood normal.        Behavior: Behavior normal.     BP (!) 156/63   Pulse 63   Ht 5\' 4"  (1.626 m)   Wt 149 lb 14.4 oz (68 kg)   BMI 25.73 kg/m  Wt Readings from Last 3 Encounters:  10/28/20 149 lb 14.4 oz (68 kg)  07/17/20 155 lb 14.4 oz (70.7 kg)  03/12/20 152 lb 4.8 oz (69.1 kg)     Health Maintenance Due  Topic Date Due  . Pneumococcal Vaccine 1-53 Years old (1 of 4 - PCV13) Never done  . OPHTHALMOLOGY EXAM  Never done  . Hepatitis C Screening  Never done  . TETANUS/TDAP  Never done  . COLONOSCOPY (Pts 45-60yrs Insurance coverage will need to be confirmed)  Never done  . Zoster Vaccines- Shingrix (1 of 2) Never done  . PNA vac Low Risk Adult (2 of 2 - PCV13) 11/19/2015  . HEMOGLOBIN A1C  11/03/2019  . FOOT EXAM  03/08/2020    There are no preventive care reminders to display for this patient.  Lab Results  Component Value Date   TSH 0.525 09/13/2013   Lab Results  Component Value Date   WBC 11.8 (H) 05/04/2019   HGB 10.8 (L) 05/04/2019    HCT 33.2 (L) 05/04/2019   MCV 95.1 05/04/2019   PLT 236 05/04/2019   Lab Results  Component Value Date   NA 136 05/04/2019   K 5.2 (H) 05/04/2019   CO2 26 05/04/2019   GLUCOSE 190 (H) 05/04/2019   BUN 35 (H) 05/04/2019   CREATININE 7.68 (H) 05/04/2019   BILITOT 0.4 09/11/2013   ALKPHOS 134 (H) 09/11/2013   AST 32 09/11/2013   ALT 39 09/11/2013   PROT 8.1 09/11/2013   ALBUMIN 2.8 (L) 09/13/2013   CALCIUM 9.0 05/04/2019   ANIONGAP 15 05/04/2019   Lab Results  Component Value Date   CHOL 144 05/05/2019   Lab Results  Component Value Date   HDL 29 (L) 05/05/2019   Lab Results  Component Value Date   LDLCALC 55 05/05/2019   Lab Results  Component Value Date   TRIG 301 (H) 05/05/2019   Lab Results  Component Value Date   CHOLHDL 5.0 05/05/2019   Lab Results  Component Value Date   HGBA1C 6.7 (H) 05/05/2019      Assessment & Plan:   Problem List Items Addressed This Visit      Cardiovascular and Mediastinum   Essential hypertension - Primary    Patient blood pressure is normal patient denies any chest pain or shortness of breath there is no history of palpitation or paroxysmal nocturnal dyspnea   patient was advised to follow low-salt low-cholesterol diet    ideally I want to keep systolic blood pressure below 130 mmHg, patient was asked to check blood pressure one times a week and give me a report on that.  Patient will be follow-up  in 3 months  or earlier as needed, patient will call me back for any change in the cardiovascular symptoms Patient was advised to buy a book from local bookstore concerning blood pressure and read several chapters  every day.  This will be supplemented by some of the material we will give him from the office.  Patient should also utilize other resources like YouTube and Internet to learn more about the blood pressure and the diet.        Digestive   Gastroesophageal reflux disease with esophagitis    - The patient's GERD is stable  on medication.  - Instructed the patient to avoid eating spicy and acidic foods, as well as foods high in fat. - Instructed the patient to avoid eating large meals or meals 2-3 hours prior to sleeping.        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 albuterol (PROVENTIL) (2.5 MG/3ML) 0.083% nebulizer solution    Sig: Take 3 mLs (2.5 mg total) by nebulization every 6 (six) hours as needed for wheezing or shortness of breath.    Dispense:  150 mL    Refill:  1  . levocetirizine (XYZAL ALLERGY 24HR) 5 MG tablet    Sig: Take 1 tablet (5 mg total) by mouth every evening.    Dispense:  30 tablet    Refill:  1    Follow-up: No follow-ups on file.    [name], MD

## 2020-10-28 NOTE — Assessment & Plan Note (Signed)
Patient was advised to see the endocrine specialist for the control of his diabetes

## 2020-10-28 NOTE — Assessment & Plan Note (Signed)

## 2020-10-28 NOTE — Assessment & Plan Note (Signed)
Started on Xyzal 5 mg daily

## 2020-10-28 NOTE — Assessment & Plan Note (Signed)
Patient is on dialysis 3 times a week

## 2020-10-30 DIAGNOSIS — N186 End stage renal disease: Secondary | ICD-10-CM | POA: Diagnosis not present

## 2020-10-30 DIAGNOSIS — Z992 Dependence on renal dialysis: Secondary | ICD-10-CM | POA: Diagnosis not present

## 2020-10-30 DIAGNOSIS — N2581 Secondary hyperparathyroidism of renal origin: Secondary | ICD-10-CM | POA: Diagnosis not present

## 2020-11-01 DIAGNOSIS — N186 End stage renal disease: Secondary | ICD-10-CM | POA: Diagnosis not present

## 2020-11-01 DIAGNOSIS — N2581 Secondary hyperparathyroidism of renal origin: Secondary | ICD-10-CM | POA: Diagnosis not present

## 2020-11-01 DIAGNOSIS — Z992 Dependence on renal dialysis: Secondary | ICD-10-CM | POA: Diagnosis not present

## 2020-11-04 DIAGNOSIS — N2581 Secondary hyperparathyroidism of renal origin: Secondary | ICD-10-CM | POA: Diagnosis not present

## 2020-11-04 DIAGNOSIS — Z992 Dependence on renal dialysis: Secondary | ICD-10-CM | POA: Diagnosis not present

## 2020-11-04 DIAGNOSIS — N186 End stage renal disease: Secondary | ICD-10-CM | POA: Diagnosis not present

## 2020-11-06 DIAGNOSIS — N186 End stage renal disease: Secondary | ICD-10-CM | POA: Diagnosis not present

## 2020-11-06 DIAGNOSIS — Z992 Dependence on renal dialysis: Secondary | ICD-10-CM | POA: Diagnosis not present

## 2020-11-06 DIAGNOSIS — N2581 Secondary hyperparathyroidism of renal origin: Secondary | ICD-10-CM | POA: Diagnosis not present

## 2020-11-08 ENCOUNTER — Other Ambulatory Visit: Payer: Self-pay | Admitting: Internal Medicine

## 2020-11-08 DIAGNOSIS — N186 End stage renal disease: Secondary | ICD-10-CM | POA: Diagnosis not present

## 2020-11-08 DIAGNOSIS — N2581 Secondary hyperparathyroidism of renal origin: Secondary | ICD-10-CM | POA: Diagnosis not present

## 2020-11-08 DIAGNOSIS — Z992 Dependence on renal dialysis: Secondary | ICD-10-CM | POA: Diagnosis not present

## 2020-11-11 DIAGNOSIS — Z992 Dependence on renal dialysis: Secondary | ICD-10-CM | POA: Diagnosis not present

## 2020-11-11 DIAGNOSIS — N186 End stage renal disease: Secondary | ICD-10-CM | POA: Diagnosis not present

## 2020-11-11 DIAGNOSIS — N2581 Secondary hyperparathyroidism of renal origin: Secondary | ICD-10-CM | POA: Diagnosis not present

## 2020-11-12 ENCOUNTER — Ambulatory Visit: Payer: Medicare HMO | Admitting: Internal Medicine

## 2020-11-13 DIAGNOSIS — Z992 Dependence on renal dialysis: Secondary | ICD-10-CM | POA: Diagnosis not present

## 2020-11-13 DIAGNOSIS — N186 End stage renal disease: Secondary | ICD-10-CM | POA: Diagnosis not present

## 2020-11-13 DIAGNOSIS — N2581 Secondary hyperparathyroidism of renal origin: Secondary | ICD-10-CM | POA: Diagnosis not present

## 2020-11-15 DIAGNOSIS — Z992 Dependence on renal dialysis: Secondary | ICD-10-CM | POA: Diagnosis not present

## 2020-11-15 DIAGNOSIS — N2581 Secondary hyperparathyroidism of renal origin: Secondary | ICD-10-CM | POA: Diagnosis not present

## 2020-11-15 DIAGNOSIS — N186 End stage renal disease: Secondary | ICD-10-CM | POA: Diagnosis not present

## 2020-11-18 ENCOUNTER — Other Ambulatory Visit: Payer: Self-pay | Admitting: Internal Medicine

## 2020-11-18 DIAGNOSIS — N2581 Secondary hyperparathyroidism of renal origin: Secondary | ICD-10-CM | POA: Diagnosis not present

## 2020-11-18 DIAGNOSIS — Z992 Dependence on renal dialysis: Secondary | ICD-10-CM | POA: Diagnosis not present

## 2020-11-18 DIAGNOSIS — N186 End stage renal disease: Secondary | ICD-10-CM | POA: Diagnosis not present

## 2020-11-19 ENCOUNTER — Other Ambulatory Visit: Payer: Self-pay | Admitting: Internal Medicine

## 2020-11-20 ENCOUNTER — Other Ambulatory Visit: Payer: Self-pay | Admitting: *Deleted

## 2020-11-20 DIAGNOSIS — Z992 Dependence on renal dialysis: Secondary | ICD-10-CM | POA: Diagnosis not present

## 2020-11-20 DIAGNOSIS — N186 End stage renal disease: Secondary | ICD-10-CM | POA: Diagnosis not present

## 2020-11-20 DIAGNOSIS — N2581 Secondary hyperparathyroidism of renal origin: Secondary | ICD-10-CM | POA: Diagnosis not present

## 2020-11-21 DIAGNOSIS — Z992 Dependence on renal dialysis: Secondary | ICD-10-CM | POA: Diagnosis not present

## 2020-11-21 DIAGNOSIS — N186 End stage renal disease: Secondary | ICD-10-CM | POA: Diagnosis not present

## 2020-11-21 DIAGNOSIS — E1122 Type 2 diabetes mellitus with diabetic chronic kidney disease: Secondary | ICD-10-CM | POA: Diagnosis not present

## 2020-11-22 DIAGNOSIS — N186 End stage renal disease: Secondary | ICD-10-CM | POA: Diagnosis not present

## 2020-11-22 DIAGNOSIS — Z992 Dependence on renal dialysis: Secondary | ICD-10-CM | POA: Diagnosis not present

## 2020-11-22 DIAGNOSIS — N2581 Secondary hyperparathyroidism of renal origin: Secondary | ICD-10-CM | POA: Diagnosis not present

## 2020-11-25 DIAGNOSIS — Z992 Dependence on renal dialysis: Secondary | ICD-10-CM | POA: Diagnosis not present

## 2020-11-25 DIAGNOSIS — N186 End stage renal disease: Secondary | ICD-10-CM | POA: Diagnosis not present

## 2020-11-25 DIAGNOSIS — N2581 Secondary hyperparathyroidism of renal origin: Secondary | ICD-10-CM | POA: Diagnosis not present

## 2020-11-25 IMAGING — MR MR ORBITS W/O CM
16 series · 46 of 48 positions shown · non-contrast
Comparison: 09/12/2013

CLINICAL DATA: Double vision

EXAM:
MRI HEAD AND ORBITS WITHOUT CONTRAST
TECHNIQUE: Multiplanar, multiecho pulse sequences of the brain and surrounding
structures were obtained without intravenous contrast. Multiplanar,
multiecho pulse sequences of the orbits and surrounding structures
were obtained including fat saturation techniques, without
intravenous contrast administration.

[Series 2: cor dwi_tracew · coronal · 5.0mm · 1.31mm/px · 3 of 38 slices shown]
[im 1/38]
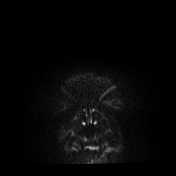
[im 19/38]
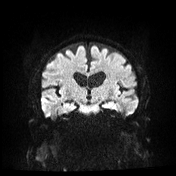
[im 38/38]
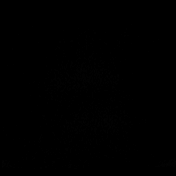

[Series 3: cor dwi_adc · coronal · 5.0mm · 1.31mm/px · 3 of 37 slices shown]
[im 1/37]
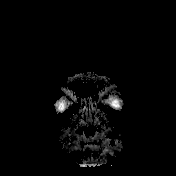
[im 19/37]
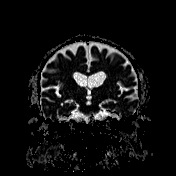
[im 37/37]
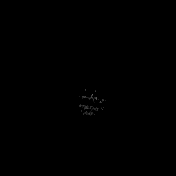

[Series 4: ax dwi_tracew · axial · 3.0mm · 1.31mm/px · z∈[-107,+42]mm · 5 of 47 slices shown]
[im 1/47]
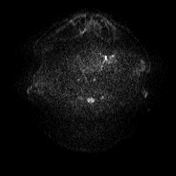
[im 12/47]
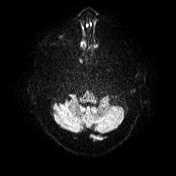
[im 24/47]
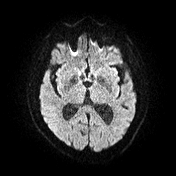
[im 35/47]
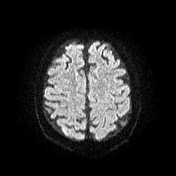
[im 47/47]
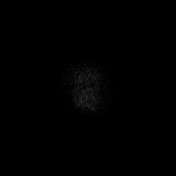

[Series 5: ax dwi_adc · axial · 3.0mm · 1.31mm/px · z∈[-107,+42]mm · 4 of 46 slices shown]
[im 1/46]
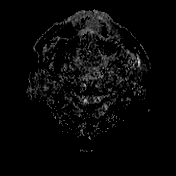
[im 16/46]
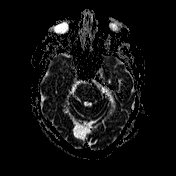
[im 31/46]
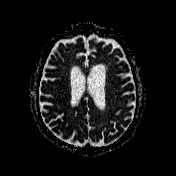
[im 46/46]
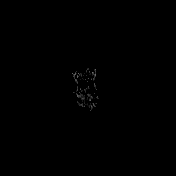

[Series 6: T1 · sagittal · 5.0mm · 0.94mm/px · 2 of 21 slices shown (1 of 4)]
[im 1/21]
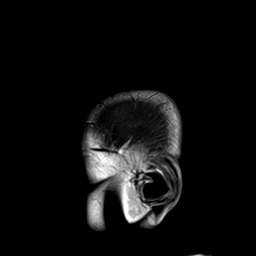
[im 21/21]
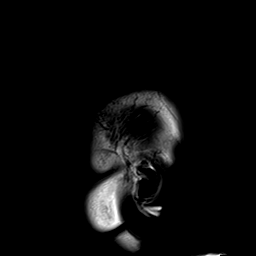

[Series 7: T2 · axial · 5.0mm · 0.45mm/px · z∈[-112,+41]mm · 3 of 27 slices shown (1 of 6)]
[im 1/27]
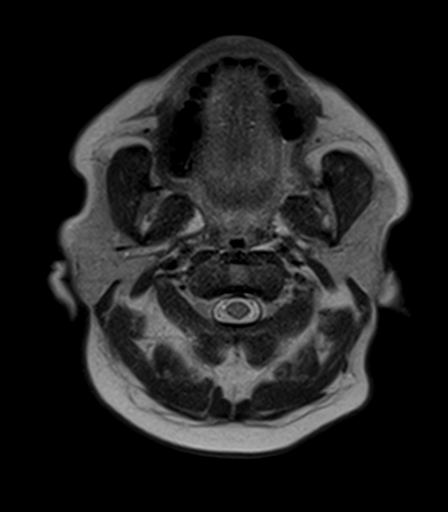
[im 14/27]
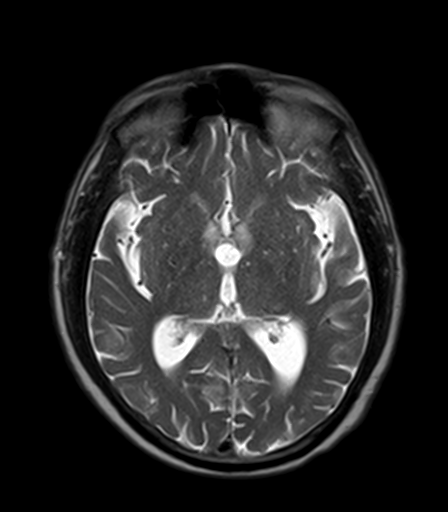
[im 27/27]
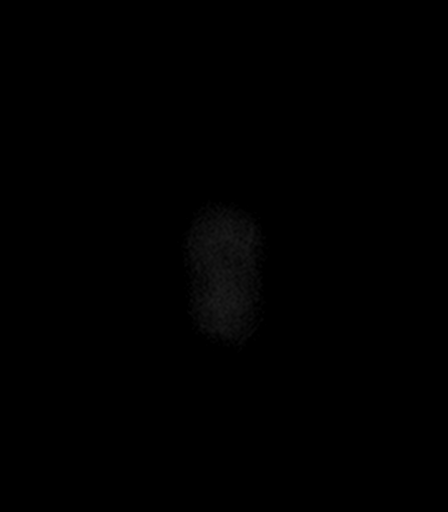

[Series 8: T2-star · axial · 5.0mm · 0.45mm/px · 1 of 27 slices shown]
[im 1/27]
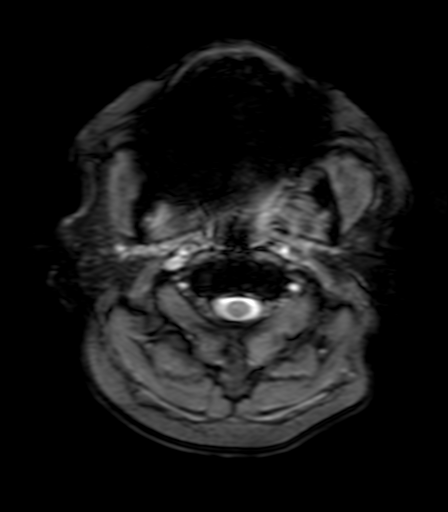

[Series 9: FLAIR · axial · 5.0mm · 1.20mm/px · z∈[-112,+41]mm · 3 of 27 slices shown]
[im 1/27]
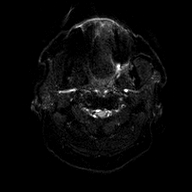
[im 14/27]
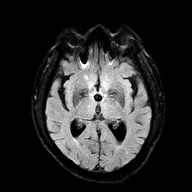
[im 27/27]
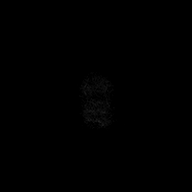

[Series 10: T1 · axial · 5.0mm · 0.90mm/px · z∈[-112,+41]mm · 3 of 27 slices shown (2 of 4)]
[im 1/27]
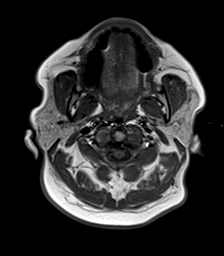
[im 14/27]
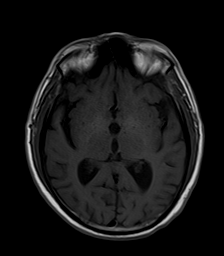
[im 27/27]
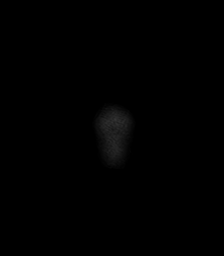

[Series 11: T2 · coronal · 5.0mm · 0.45mm/px · 3 of 31 slices shown (2 of 6)]
[im 1/31]
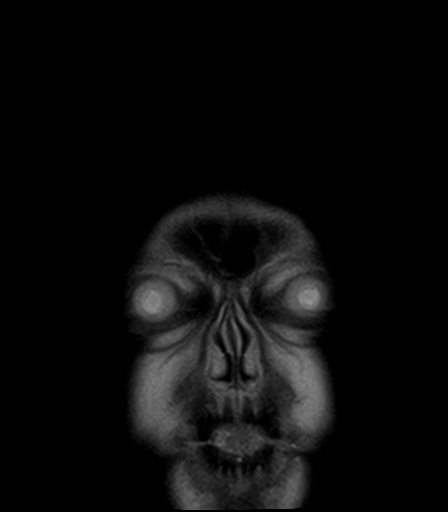
[im 16/31]
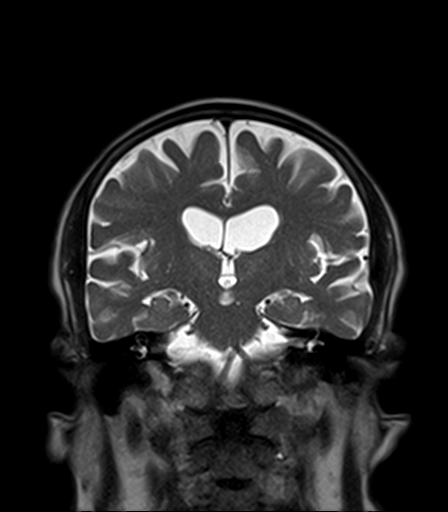
[im 31/31]
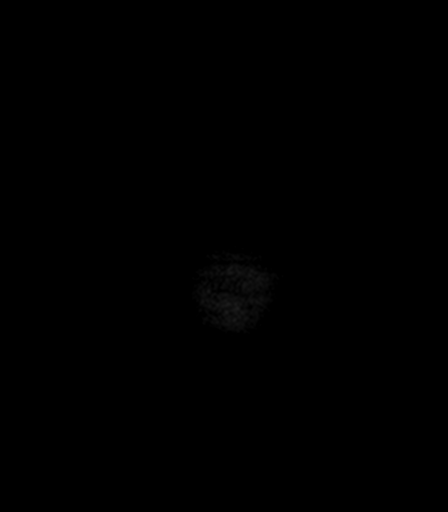

[Series 12: T2 · axial · 3.0mm · 0.78mm/px · z∈[-83,-21]mm · 2 of 17 slices shown (3 of 6)]
[im 1/17]
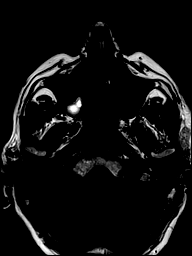
[im 17/17]
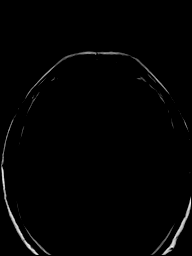

[Series 13: T2 · axial · 3.0mm · 0.78mm/px · z∈[-83,-21]mm · 2 of 17 slices shown (4 of 6)]
[im 1/17]
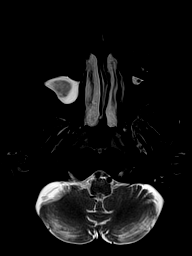
[im 17/17]
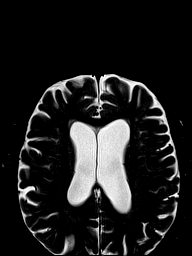

[Series 14: T2 · coronal · 3.0mm · 0.78mm/px · 3 of 35 slices shown (5 of 6)]
[im 1/35]
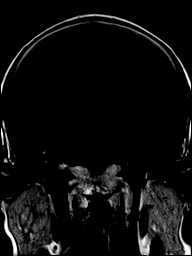
[im 18/35]
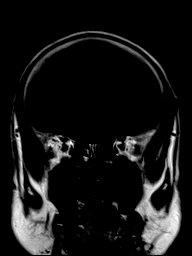
[im 35/35]
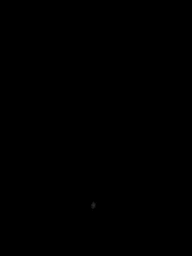

[Series 15: T2 · coronal · 3.0mm · 0.78mm/px · 3 of 35 slices shown (6 of 6)]
[im 1/35]
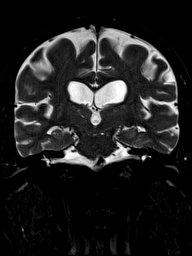
[im 18/35]
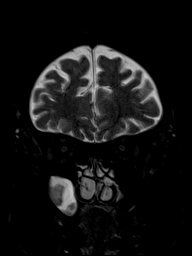
[im 35/35]
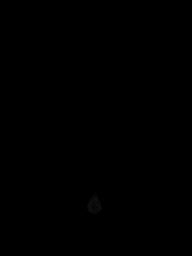

[Series 16: T1 · axial · non-contrast · 3.0mm · 0.31mm/px · z∈[-80,-16]mm · 2 of 23 slices shown (3 of 4)]
[im 1/23]
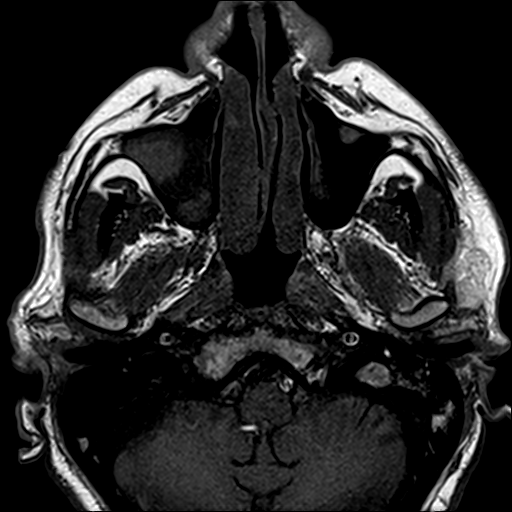
[im 23/23]
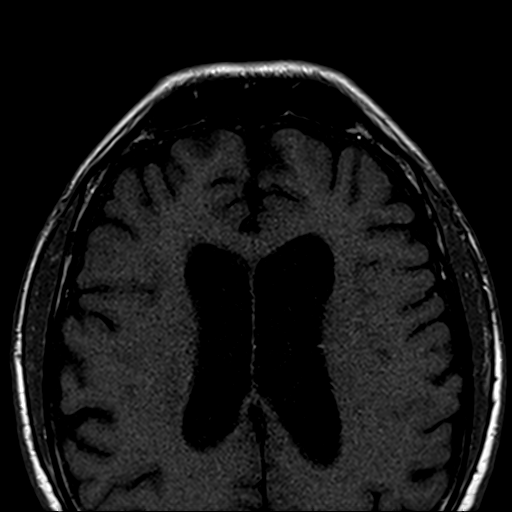

[Series 17: T1 · coronal · non-contrast · 3.0mm · 0.35mm/px · 4 of 40 slices shown (4 of 4)]
[im 1/40]
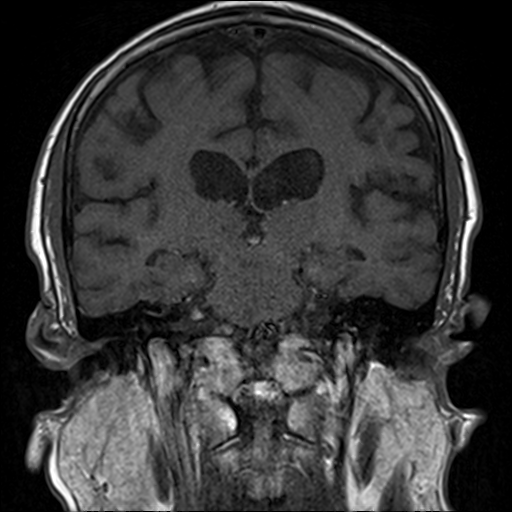
[im 14/40]
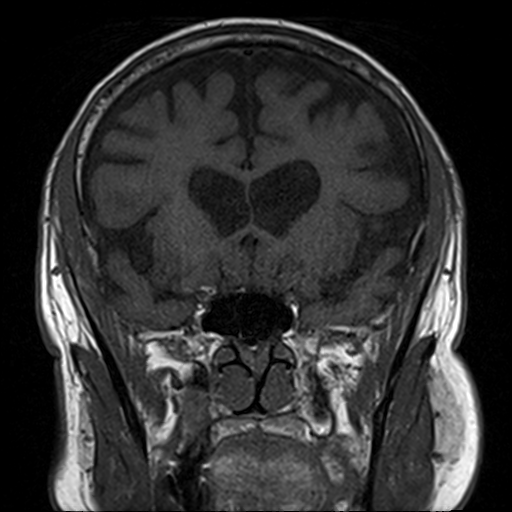
[im 27/40]
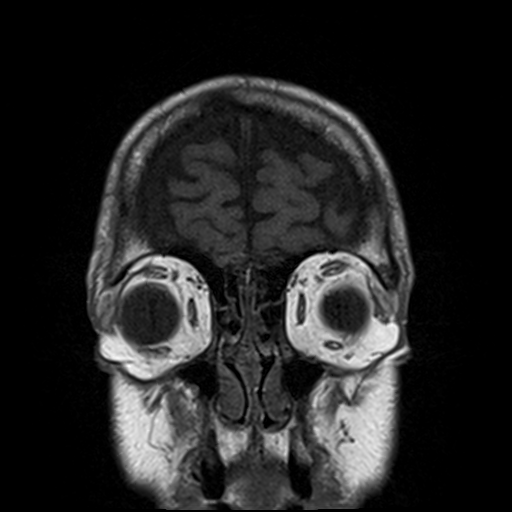
[im 40/40]
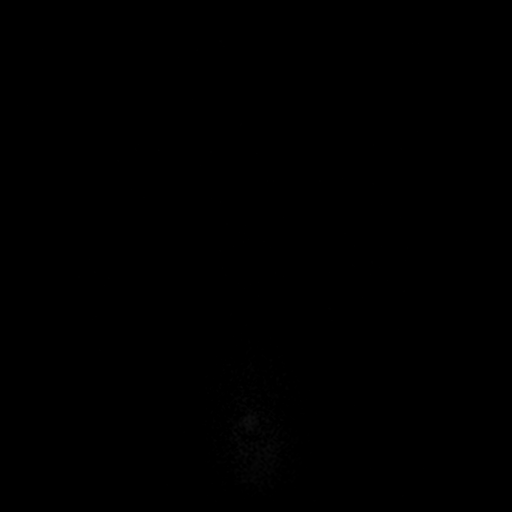

[46 of 48 positions shown; findings below may reference images not displayed]

FINDINGS: MRI HEAD FINDINGS

Brain: Acute punctate infarction in the medial longitudinal
fasciculus region of the dorsal pons on the left could certainly
explain double vision. No other acute brain infarction. Old small
vessel infarction in the right thalamus. Elsewhere, there is an old
infarction in the right occipital lobe with porencephaly. Mild
chronic small-vessel ischemic changes affect the cerebral
hemispheric white matter elsewhere. No other large vessel territory
infarction. No mass lesion, hemorrhage, hydrocephalus or extra-axial
collection.

Vascular: Major vessels at the base of the brain show flow.

Skull and upper cervical spine: Negative

Other: Retention cysts of the maxillary sinuses.

MRI ORBITS FINDINGS

Orbits: Both globes appear normal. Previous lens implants. Optic
nerves are normal. Extra-ocular muscles are normal. Orbital fat is
normal. Lacrimal glands are normal.

Visualized sinuses: Retention cysts as noted above.

Soft tissues: Otherwise negative

Limited intracranial: See results of brain study above.
IMPRESSION: Acute infarction of the medial longitudinal fasciculus region of the
dorsal pons on the left which could certainly explain double vision.

Old right occipital infarction.

Mild chronic small-vessel change elsewhere affecting the brain as
described above.

No primary orbital pathology seen.

## 2020-11-26 IMAGING — US US CAROTID DUPLEX BILAT
1 series · 13 of 24 positions shown · non-contrast
Comparison: 09/12/2013

CLINICAL DATA: 73-year-old male with a stroke

EXAM:
BILATERAL CAROTID DUPLEX ULTRASOUND
TECHNIQUE: Gray scale imaging, color Doppler and duplex ultrasound were
performed of bilateral carotid and vertebral arteries in the neck.

[Series 1: us carotid duplex bilat · 13 of 60 slices shown]
[im 1/60]
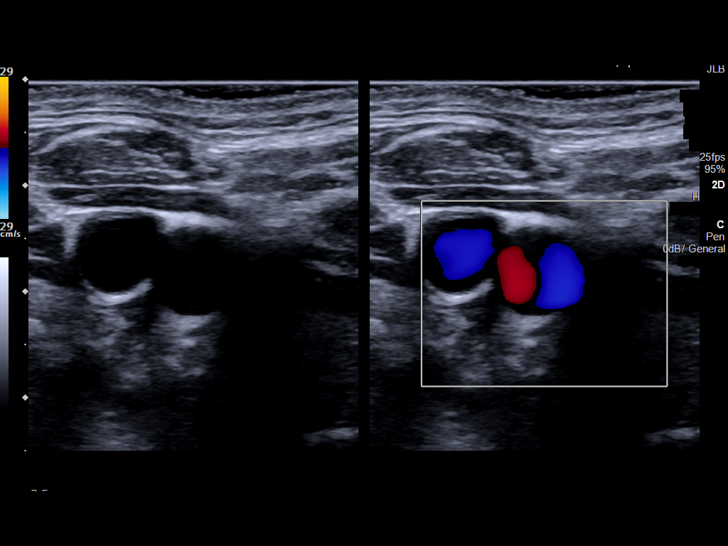
[im 6/60]
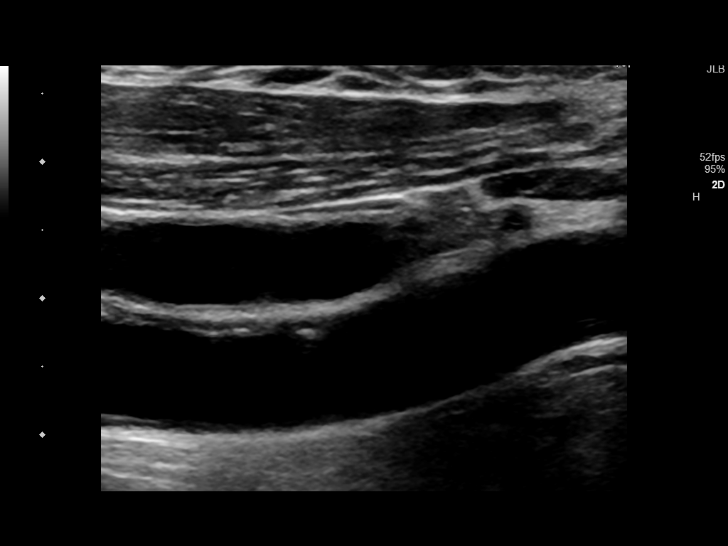
[im 11/60]
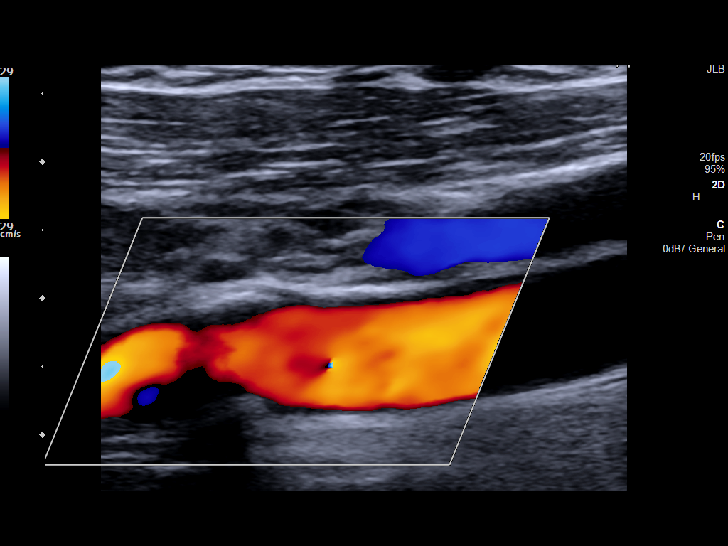
[im 16/60]
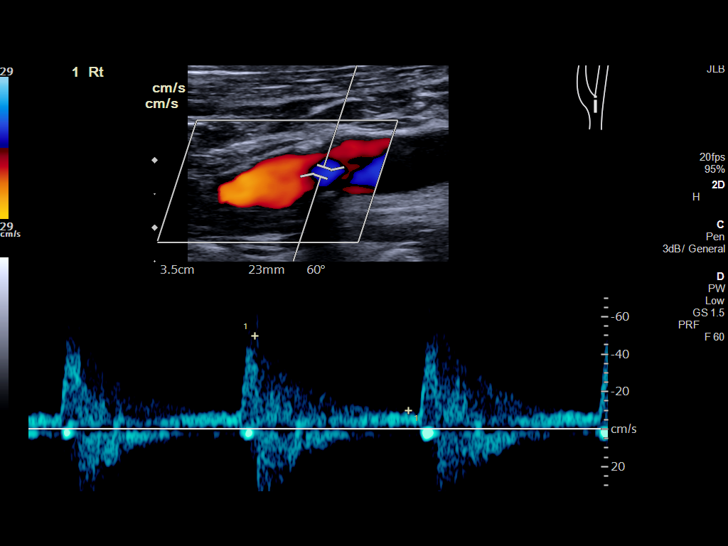
[im 21/60]
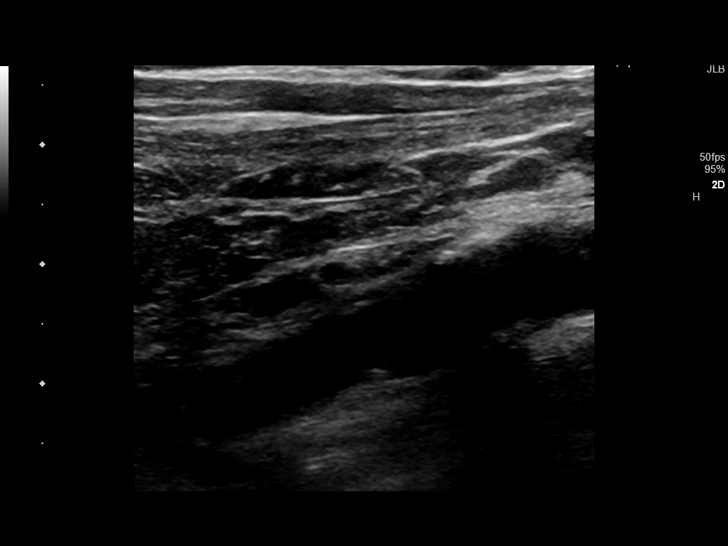
[im 26/60]
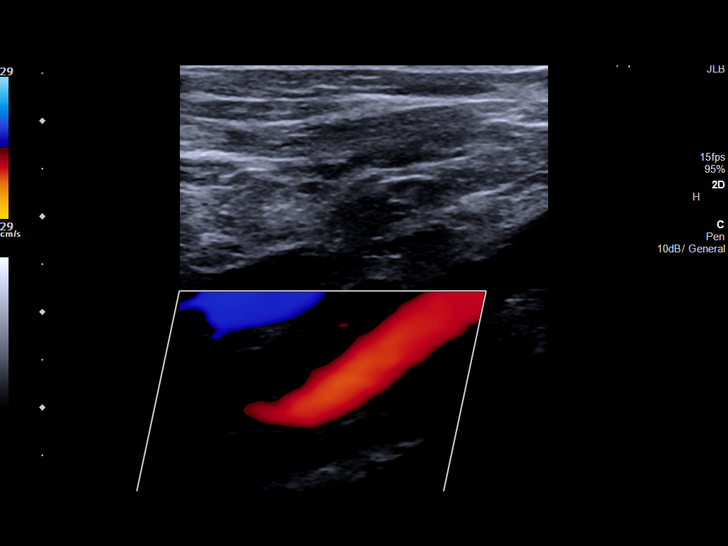
[im 31/60]
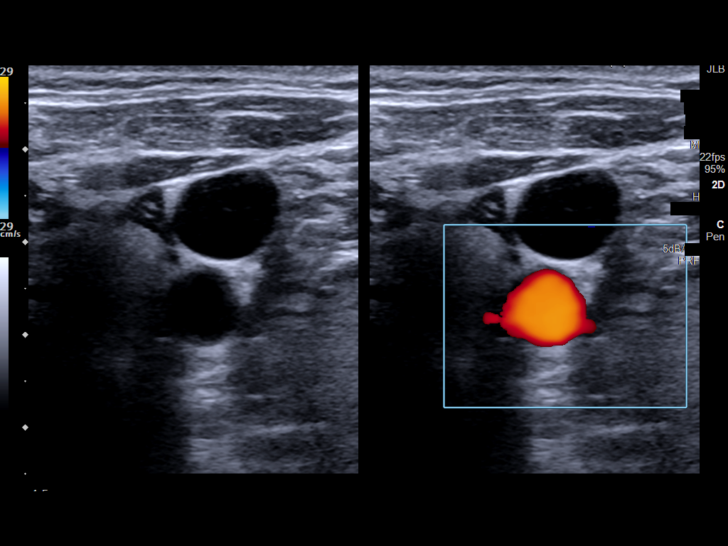
[im 34/60]
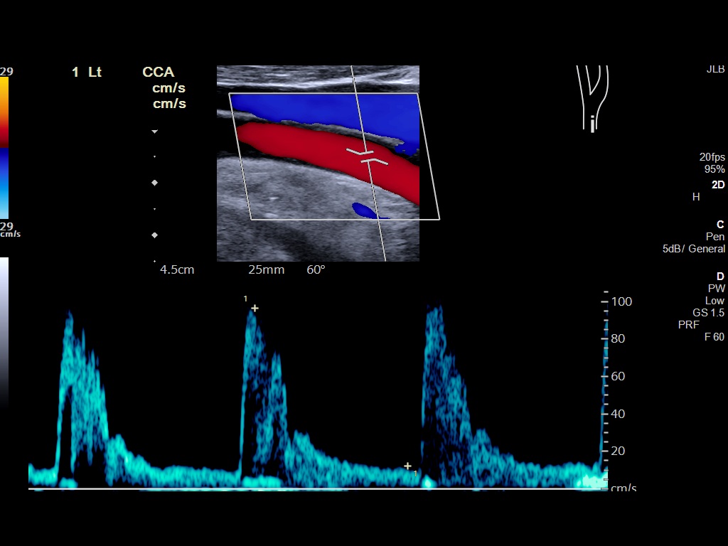
[im 39/60]
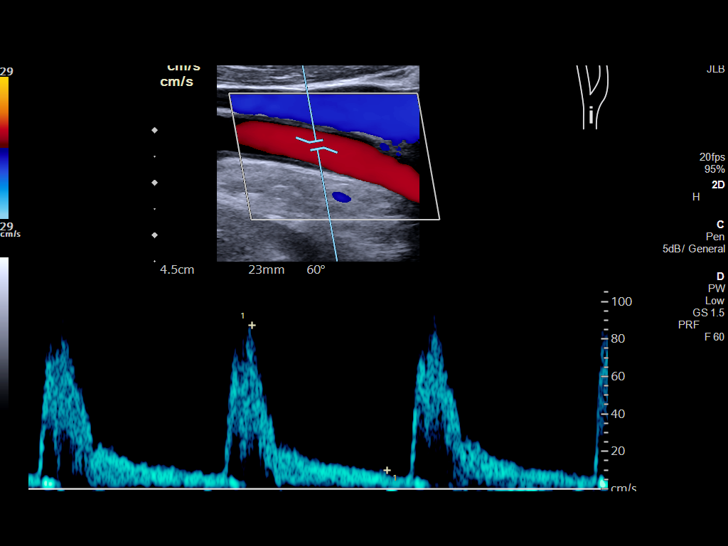
[im 44/60]
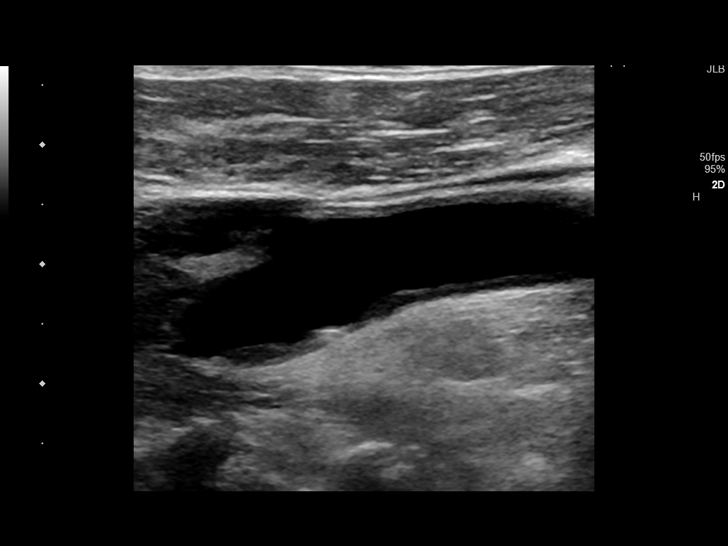
[im 49/60]
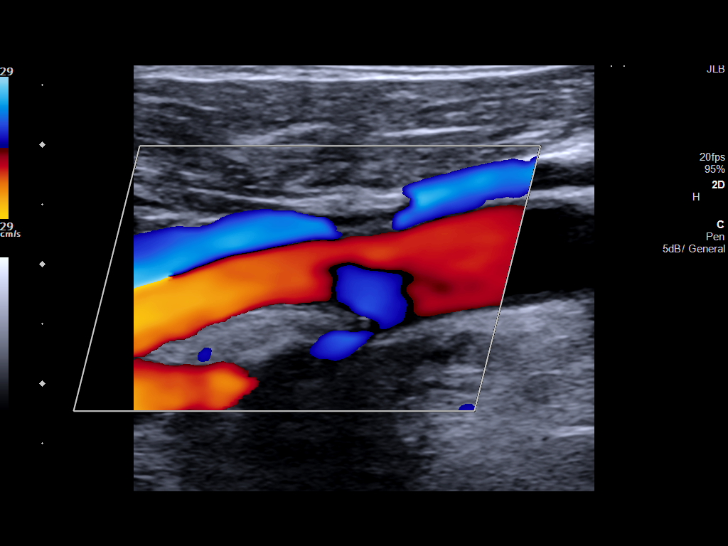
[im 54/60]
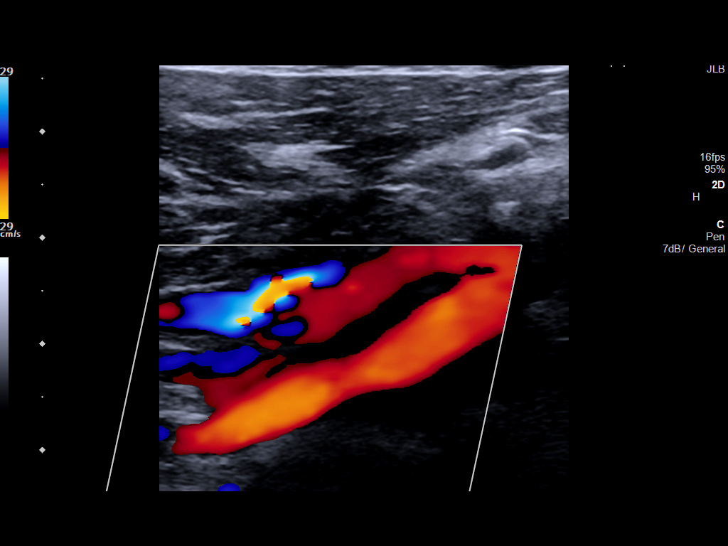
[im 60/60]
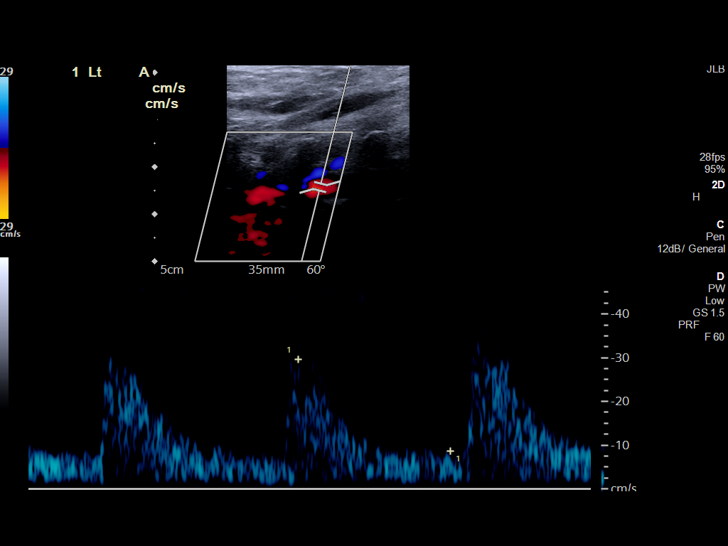

[13 of 24 positions shown; findings below may reference images not displayed]

FINDINGS: Criteria: Quantification of carotid stenosis is based on velocity
parameters that correlate the residual internal carotid diameter
with NASCET-based stenosis levels, using the diameter of the distal
internal carotid lumen as the denominator for stenosis measurement.

The following velocity measurements were obtained:

RIGHT

ICA:  Systolic 101 cm/sec, Diastolic 23 cm/sec

CCA:  97 cm/sec

SYSTOLIC ICA/CCA RATIO:

ECA:  152 cm/sec

LEFT

ICA:  Systolic 77 cm/sec, Diastolic 18 cm/sec

CCA:  87 cm/sec

SYSTOLIC ICA/CCA RATIO:

ECA:  137 cm/sec

Right Brachial SBP: Not acquired

Left Brachial SBP: Not acquired

RIGHT CAROTID ARTERY: No significant calcifications of the right
common carotid artery. Intermediate waveform maintained.
Heterogeneous and partially calcified plaque at the right carotid
bifurcation. No significant lumen shadowing. Low resistance waveform
of the right ICA. Mild tortuosity

RIGHT VERTEBRAL ARTERY: Antegrade flow with low resistance waveform.

LEFT CAROTID ARTERY: No significant calcifications of the left
common carotid artery. Intermediate waveform maintained.
Heterogeneous and partially calcified plaque at the left carotid
bifurcation without significant lumen shadowing. Low resistance
waveform of the left ICA. Mild tortuosity

LEFT VERTEBRAL ARTERY:  Antegrade flow with low resistance waveform.
IMPRESSION: Color duplex indicates minimal heterogeneous and calcified plaque,
with no hemodynamically significant stenosis by duplex criteria in
the extracranial cerebrovascular circulation.

## 2020-11-27 DIAGNOSIS — N186 End stage renal disease: Secondary | ICD-10-CM | POA: Diagnosis not present

## 2020-11-27 DIAGNOSIS — N2581 Secondary hyperparathyroidism of renal origin: Secondary | ICD-10-CM | POA: Diagnosis not present

## 2020-11-27 DIAGNOSIS — Z992 Dependence on renal dialysis: Secondary | ICD-10-CM | POA: Diagnosis not present

## 2020-11-29 DIAGNOSIS — N2581 Secondary hyperparathyroidism of renal origin: Secondary | ICD-10-CM | POA: Diagnosis not present

## 2020-11-29 DIAGNOSIS — Z992 Dependence on renal dialysis: Secondary | ICD-10-CM | POA: Diagnosis not present

## 2020-11-29 DIAGNOSIS — N186 End stage renal disease: Secondary | ICD-10-CM | POA: Diagnosis not present

## 2020-12-02 DIAGNOSIS — Z992 Dependence on renal dialysis: Secondary | ICD-10-CM | POA: Diagnosis not present

## 2020-12-02 DIAGNOSIS — N186 End stage renal disease: Secondary | ICD-10-CM | POA: Diagnosis not present

## 2020-12-02 DIAGNOSIS — N2581 Secondary hyperparathyroidism of renal origin: Secondary | ICD-10-CM | POA: Diagnosis not present

## 2020-12-04 DIAGNOSIS — Z992 Dependence on renal dialysis: Secondary | ICD-10-CM | POA: Diagnosis not present

## 2020-12-04 DIAGNOSIS — N186 End stage renal disease: Secondary | ICD-10-CM | POA: Diagnosis not present

## 2020-12-04 DIAGNOSIS — N2581 Secondary hyperparathyroidism of renal origin: Secondary | ICD-10-CM | POA: Diagnosis not present

## 2020-12-05 ENCOUNTER — Encounter: Payer: Self-pay | Admitting: Vascular Surgery

## 2020-12-05 ENCOUNTER — Ambulatory Visit (INDEPENDENT_AMBULATORY_CARE_PROVIDER_SITE_OTHER): Payer: Medicare HMO | Admitting: Vascular Surgery

## 2020-12-05 ENCOUNTER — Ambulatory Visit (HOSPITAL_COMMUNITY)
Admission: RE | Admit: 2020-12-05 | Discharge: 2020-12-05 | Disposition: A | Discharge status: Home / Self Care | Payer: Medicare HMO | Source: Ambulatory Visit | Attending: Vascular Surgery | Admitting: Vascular Surgery

## 2020-12-05 ENCOUNTER — Ambulatory Visit (INDEPENDENT_AMBULATORY_CARE_PROVIDER_SITE_OTHER)
Admission: RE | Admit: 2020-12-05 | Discharge: 2020-12-05 | Disposition: A | Discharge status: Home / Self Care | Payer: Medicare HMO | Source: Ambulatory Visit | Attending: Vascular Surgery | Admitting: Vascular Surgery

## 2020-12-05 ENCOUNTER — Other Ambulatory Visit: Payer: Self-pay

## 2020-12-05 VITALS — BP 133/51 | HR 55 | Temp 98.2°F | Resp 16 | Ht 64.0 in | Wt 149.3 lb

## 2020-12-05 DIAGNOSIS — Z992 Dependence on renal dialysis: Secondary | ICD-10-CM | POA: Diagnosis not present

## 2020-12-05 DIAGNOSIS — N186 End stage renal disease: Secondary | ICD-10-CM | POA: Insufficient documentation

## 2020-12-05 NOTE — H&P (View-Only) (Signed)
Referring Physician: Dr. Posey Pronto  Patient name: Joseph Mcpherson MRN: [id number] DOB: May 17, 1946 Sex: male  REASON FOR CONSULT: Hemodialysis access  HPI: Joseph Mcpherson is a 74 y.o. male, referred for placement of a long-term hemodialysis access.  Patient has had a previous left upper arm AV graft at [facility].  This was in 2020.  He also had a previous right brachiocephalic AV fistula by Dr. Bridgett Larsson in 2019.  He did have some mild to moderate steal symptoms in his left arm while he had a functioning left arm AV graft.  Dr. 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  The ulnar artery was the dominant runoff vessel to the left hand.  He currently has no numbness or tingling in his left hand.  Other medical problems include diabetes hyperlipidemia hypertension all of which are currently stable.  He is currently dialyzing via catheter.  Past Medical History:  Diagnosis Date   Chronic kidney disease    Clotting disorder ([place])    per daughter, "when patient travels, his feet swell"   Diabetes mellitus without complication ([place])    Diabetic eye exam ([place]) 12/2019   GERD (gastroesophageal reflux disease)    Hyperlipidemia    Hypertension    Hypothyroidism    Thyroid disease    Past Surgical History:  Procedure Laterality Date   AV FISTULA PLACEMENT Right 10/22/2017   Procedure: ARTERIOVENOUS (AV) BRACHIOCEPHALIC FISTULA CREATION RIGHT UPPER ARM;  Surgeon: Conrad St. George, MD;  Location: [facility];  Service: Vascular;  Laterality: Right;   AV FISTULA PLACEMENT Left 02/03/2019   Procedure: INSERTION OF ARTERIOVENOUS (AV) GORE-TEX GRAFT ARM (BRACHIAL AXILLARY );  Surgeon: [name], MD;  Location: [facility];  Service: Vascular;  Laterality: Left;   UPPER EXTREMITY ANGIOGRAPHY Left 09/19/2019   Procedure: UPPER EXTREMITY ANGIOGRAPHY;  Surgeon: [name], MD;   Location: [facility];  Service: Cardiovascular;  Laterality: Left;    Family History  Problem Relation Age of Onset   Diabetes Mother    Hyperlipidemia Mother    Hypertension Father     SOCIAL HISTORY: Social History   Socioeconomic History   Marital status: Married    Spouse name: Not on file   Number of children: Not on file   Years of education: Not on file   Highest education level: Not on file  Occupational History   Not on file  Tobacco Use   Smoking status: Never   Smokeless tobacco: Former    Types: Snuff  Vaping Use   Vaping Use: Never used  Substance and Sexual Activity   Alcohol use: No   Drug use: No   Sexual activity: Not Currently  Other Topics Concern   Not on file  Social History Narrative   Not on file   Social Determinants of Health   Financial Resource Strain: Not on file  Food Insecurity: Not on file  Transportation Needs: Not on file  Physical Activity: Not on file  Stress: Not on file  Social Connections: Not on file  Intimate Partner Violence: Not on file    No Known Allergies  Current Outpatient Medications  Medication Sig Dispense Refill   albuterol (PROVENTIL) (2.5 MG/3ML) 0.083% nebulizer solution Take 3 mLs (2.5 mg total) by nebulization every 6 (six) hours as needed for wheezing or shortness of breath. 150 mL 1   aspirin EC  325 MG EC tablet Take 1 tablet (325 mg total) by mouth daily. 30 tablet 3   B Complex-C-Folic Acid (RENA-VITE RX) 1 MG TABS Take 1 tablet by mouth daily.      calcium acetate (PHOSLO) 667 MG capsule TAKE 2 CAPSULES BY MOUTH 3 TIMES A DAY WITH MEAL 180 capsule 4   Calcium Acetate 668 (169 Ca) MG TABS Take 667 mg by mouth in the morning, at noon, and at bedtime.     calcium acetate, Phos Binder, (PHOSLYRA) 667 MG/5ML SOLN Take by mouth 3 (three) times daily with meals.     Cholecalciferol (VITAMIN D3) 5000 units TABS Take 5,000 Units by mouth daily.      Continuous Blood Gluc Sensor (FREESTYLE LIBRE 14  DAY SENSOR) MISC 1 EACH BY DOES NOT APPLY ROUTE EVERY 14 (FOURTEEN) DAYS. 1 each 3   docusate sodium (COLACE) 100 MG capsule TAKE 1 CAPSULE BY MOUTH TWICE A DAY 60 capsule 1   donepezil (ARICEPT) 10 MG tablet Take 10 mg by mouth at bedtime.      gabapentin (NEURONTIN) 300 MG capsule TAKE 1 CAPSULE BY MOUTH EVERY DAY AT BEDTIME AS DIRECTED 90 capsule 3   hydrALAZINE (APRESOLINE) 50 MG tablet Take 1 tablet (50 mg total) by mouth 3 (three) times daily. (Patient taking differently: Take 25 mg by mouth 3 (three) times daily.) 90 tablet 6   insulin aspart (NOVOLOG) 100 UNIT/ML FlexPen Inject 8 Units into the skin 2 (two) times daily.      insulin detemir (LEVEMIR) 100 UNIT/ML injection Inject 16 Units into the skin at bedtime.      levocetirizine (XYZAL) 5 MG tablet TAKE 1 TABLET BY MOUTH EVERY DAY IN THE EVENING 30 tablet 1   levothyroxine (SYNTHROID) 137 MCG tablet Take 1 tablet (137 mcg total) by mouth daily. 90 tablet 3   losartan (COZAAR) 25 MG tablet Take 1 tablet (25 mg total) by mouth every evening. 90 tablet 3   metoprolol succinate (TOPROL-XL) 50 MG 24 hr tablet Take 1 tablet (50 mg total) by mouth daily. 90 tablet 3   omeprazole (PRILOSEC) 40 MG capsule TAKE 1 CAPSULE BY MOUTH EVERY DAY 90 capsule 2   pantoprazole (PROTONIX) 40 MG tablet Take 1 tablet (40 mg total) by mouth daily. 90 tablet 3   sevelamer carbonate (RENVELA) 2.4 g PACK Take 2.4 g by mouth 3 (three) times daily.     simvastatin (ZOCOR) 20 MG tablet Take 1 tablet (20 mg total) by mouth every evening. 90 tablet 3   sitaGLIPtin (JANUVIA) 50 MG tablet Take 50 mg by mouth every evening.      No current facility-administered medications for this visit.    ROS:   General:  No weight loss, Fever, chills  HEENT: No recent headaches, no nasal bleeding, no visual changes, no sore throat  Neurologic: No dizziness, blackouts, seizures. No recent symptoms of stroke or mini- stroke. No recent episodes of slurred speech, or temporary  blindness.  Cardiac: No recent episodes of chest pain/pressure, no shortness of breath at rest.  No shortness of breath with exertion.  Denies history of atrial fibrillation or irregular heartbeat  Vascular: No history of rest pain in feet.  No history of claudication.  No history of non-healing ulcer, No history of DVT   Pulmonary: No home oxygen, no productive cough, no hemoptysis,  No asthma or wheezing  Musculoskeletal:  [ ]  Arthritis, [ ]  Low back pain,  [ ]  Joint pain  Hematologic:No history of  hypercoagulable state.  No history of easy bleeding.  No history of anemia  Gastrointestinal: No hematochezia or melena,  No gastroesophageal reflux, no trouble swallowing  Urinary: [X]  chronic Kidney disease, [X]  on HD - [X]  MWF or [ ]  TTHS, [ ]  Burning with urination, [ ]  Frequent urination, [ ]  Difficulty urinating;   Skin: No rashes  Psychological: No history of anxiety,  No history of depression   Physical Examination  Vitals:   12/05/20 1345  BP: (!) 133/51  Pulse: (!) 55  Resp: 16  Temp: 98.2 F (36.8 C)  TempSrc: Temporal  SpO2: 97%  Weight: 149 lb 4.8 oz (67.7 kg)  Height: 5\' 4"  (1.626 m)    Body mass index is 25.63 kg/m.  General:  Alert and oriented, no acute distress HEENT: Normal Neck: No JVD Pulmonary: Clear to auscultation bilaterally Cardiac: Regular Rate and Rhythm Skin: No rash Extremity Pulses: No palpable brachial radial ulnar pulses bilaterally, thrombosed left upper arm graft Musculoskeletal: No deformity or edema  Neurologic: Upper and lower extremity motor 5/5 and symmetric  DATA:  Duplex ultrasound of the arterial anatomy in the left and right upper extremity was performed today.  There was monophasic flow in the right brachial artery with a 4 mm diameter.  There was triphasic flow in the left brachial artery with 4 mm diameter.  There was monophasic flow in the forearm on the left side.  Vein mapping ultrasound was also performed today.  Right  basilic vein was 3 to 4 mm in diameter.  Left upper arm cephalic vein was 3 mm in diameter.  There is a portion of the vein which crosses underneath the AV graft in the upper arm.  Left basilic vein was small.  ASSESSMENT: Patient needs long-term hemodialysis access.  He has a 3 mm left cephalic vein.  He did have some mild steal previously and will have some risk of recurrent steal in this arm.  However, I believe this represents his best possible option for access currently.  I discussed with the patient and his wife today risks of steal and they will call me if he has any problems postoperatively.  Other risks including but not limited to bleeding infection not maturation of the fistula were discussed.  Additionally since the cephalic vein crosses under the pre-existing thrombosed AV graft this segment of graft may have to be removed if it inhibits fistula maturation.   PLAN: Left brachiocephalic AV fistula scheduled for Tuesday, December 10, 2020.  Potentially I may have to remove portion of the AV graft as well if it compresses the outflow of the left cephalic vein.   Ruta Hinds, MD [facility] Office: [phone]

## 2020-12-05 NOTE — Progress Notes (Signed)
Referring Physician: Dr. Posey Pronto  Patient name: Joseph Mcpherson MRN: [id number] DOB: 1946/03/03 Sex: male  REASON FOR CONSULT: Hemodialysis access  HPI: Joseph Mcpherson is a 75 y.o. male, referred for placement of a long-term hemodialysis access.  Patient has had a previous left upper arm AV graft at [facility].  This was in 2020.  He also had a previous right brachiocephalic AV fistula by Dr. Bridgett Larsson in 2019.  He did have some mild to moderate steal symptoms in his left arm while he had a functioning left arm AV graft.  Dr. 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  The ulnar artery was the dominant runoff vessel to the left hand.  He currently has no numbness or tingling in his left hand.  Other medical problems include diabetes hyperlipidemia hypertension all of which are currently stable.  He is currently dialyzing via catheter.  Past Medical History:  Diagnosis Date   Chronic kidney disease    Clotting disorder ([place])    per daughter, "when patient travels, his feet swell"   Diabetes mellitus without complication ([place])    Diabetic eye exam ([place]) 12/2019   GERD (gastroesophageal reflux disease)    Hyperlipidemia    Hypertension    Hypothyroidism    Thyroid disease    Past Surgical History:  Procedure Laterality Date   AV FISTULA PLACEMENT Right 10/22/2017   Procedure: ARTERIOVENOUS (AV) BRACHIOCEPHALIC FISTULA CREATION RIGHT UPPER ARM;  Surgeon: Conrad Henrico, MD;  Location: [facility];  Service: Vascular;  Laterality: Right;   AV FISTULA PLACEMENT Left 02/03/2019   Procedure: INSERTION OF ARTERIOVENOUS (AV) GORE-TEX GRAFT ARM (BRACHIAL AXILLARY );  Surgeon: [name], MD;  Location: [facility];  Service: Vascular;  Laterality: Left;   UPPER EXTREMITY ANGIOGRAPHY Left 09/19/2019   Procedure: UPPER EXTREMITY ANGIOGRAPHY;  Surgeon: [name], MD;   Location: [facility];  Service: Cardiovascular;  Laterality: Left;    Family History  Problem Relation Age of Onset   Diabetes Mother    Hyperlipidemia Mother    Hypertension Father     SOCIAL HISTORY: Social History   Socioeconomic History   Marital status: Married    Spouse name: Not on file   Number of children: Not on file   Years of education: Not on file   Highest education level: Not on file  Occupational History   Not on file  Tobacco Use   Smoking status: Never   Smokeless tobacco: Former    Types: Snuff  Vaping Use   Vaping Use: Never used  Substance and Sexual Activity   Alcohol use: No   Drug use: No   Sexual activity: Not Currently  Other Topics Concern   Not on file  Social History Narrative   Not on file   Social Determinants of Health   Financial Resource Strain: Not on file  Food Insecurity: Not on file  Transportation Needs: Not on file  Physical Activity: Not on file  Stress: Not on file  Social Connections: Not on file  Intimate Partner Violence: Not on file    No Known Allergies  Current Outpatient Medications  Medication Sig Dispense Refill   albuterol (PROVENTIL) (2.5 MG/3ML) 0.083% nebulizer solution Take 3 mLs (2.5 mg total) by nebulization every 6 (six) hours as needed for wheezing or shortness of breath. 150 mL 1   aspirin EC  325 MG EC tablet Take 1 tablet (325 mg total) by mouth daily. 30 tablet 3   B Complex-C-Folic Acid (RENA-VITE RX) 1 MG TABS Take 1 tablet by mouth daily.      calcium acetate (PHOSLO) 667 MG capsule TAKE 2 CAPSULES BY MOUTH 3 TIMES A DAY WITH MEAL 180 capsule 4   Calcium Acetate 668 (169 Ca) MG TABS Take 667 mg by mouth in the morning, at noon, and at bedtime.     calcium acetate, Phos Binder, (PHOSLYRA) 667 MG/5ML SOLN Take by mouth 3 (three) times daily with meals.     Cholecalciferol (VITAMIN D3) 5000 units TABS Take 5,000 Units by mouth daily.      Continuous Blood Gluc Sensor (FREESTYLE LIBRE 14  DAY SENSOR) MISC 1 EACH BY DOES NOT APPLY ROUTE EVERY 14 (FOURTEEN) DAYS. 1 each 3   docusate sodium (COLACE) 100 MG capsule TAKE 1 CAPSULE BY MOUTH TWICE A DAY 60 capsule 1   donepezil (ARICEPT) 10 MG tablet Take 10 mg by mouth at bedtime.      gabapentin (NEURONTIN) 300 MG capsule TAKE 1 CAPSULE BY MOUTH EVERY DAY AT BEDTIME AS DIRECTED 90 capsule 3   hydrALAZINE (APRESOLINE) 50 MG tablet Take 1 tablet (50 mg total) by mouth 3 (three) times daily. (Patient taking differently: Take 25 mg by mouth 3 (three) times daily.) 90 tablet 6   insulin aspart (NOVOLOG) 100 UNIT/ML FlexPen Inject 8 Units into the skin 2 (two) times daily.      insulin detemir (LEVEMIR) 100 UNIT/ML injection Inject 16 Units into the skin at bedtime.      levocetirizine (XYZAL) 5 MG tablet TAKE 1 TABLET BY MOUTH EVERY DAY IN THE EVENING 30 tablet 1   levothyroxine (SYNTHROID) 137 MCG tablet Take 1 tablet (137 mcg total) by mouth daily. 90 tablet 3   losartan (COZAAR) 25 MG tablet Take 1 tablet (25 mg total) by mouth every evening. 90 tablet 3   metoprolol succinate (TOPROL-XL) 50 MG 24 hr tablet Take 1 tablet (50 mg total) by mouth daily. 90 tablet 3   omeprazole (PRILOSEC) 40 MG capsule TAKE 1 CAPSULE BY MOUTH EVERY DAY 90 capsule 2   pantoprazole (PROTONIX) 40 MG tablet Take 1 tablet (40 mg total) by mouth daily. 90 tablet 3   sevelamer carbonate (RENVELA) 2.4 g PACK Take 2.4 g by mouth 3 (three) times daily.     simvastatin (ZOCOR) 20 MG tablet Take 1 tablet (20 mg total) by mouth every evening. 90 tablet 3   sitaGLIPtin (JANUVIA) 50 MG tablet Take 50 mg by mouth every evening.      No current facility-administered medications for this visit.    ROS:   General:  No weight loss, Fever, chills  HEENT: No recent headaches, no nasal bleeding, no visual changes, no sore throat  Neurologic: No dizziness, blackouts, seizures. No recent symptoms of stroke or mini- stroke. No recent episodes of slurred speech, or temporary  blindness.  Cardiac: No recent episodes of chest pain/pressure, no shortness of breath at rest.  No shortness of breath with exertion.  Denies history of atrial fibrillation or irregular heartbeat  Vascular: No history of rest pain in feet.  No history of claudication.  No history of non-healing ulcer, No history of DVT   Pulmonary: No home oxygen, no productive cough, no hemoptysis,  No asthma or wheezing  Musculoskeletal:  [ ]  Arthritis, [ ]  Low back pain,  [ ]  Joint pain  Hematologic:No history of  hypercoagulable state.  No history of easy bleeding.  No history of anemia  Gastrointestinal: No hematochezia or melena,  No gastroesophageal reflux, no trouble swallowing  Urinary: [X]  chronic Kidney disease, [X]  on HD - [X]  MWF or [ ]  TTHS, [ ]  Burning with urination, [ ]  Frequent urination, [ ]  Difficulty urinating;   Skin: No rashes  Psychological: No history of anxiety,  No history of depression   Physical Examination  Vitals:   12/05/20 1345  BP: (!) 133/51  Pulse: (!) 55  Resp: 16  Temp: 98.2 F (36.8 C)  TempSrc: Temporal  SpO2: 97%  Weight: 149 lb 4.8 oz (67.7 kg)  Height: 5\' 4"  (1.626 m)    Body mass index is 25.63 kg/m.  General:  Alert and oriented, no acute distress HEENT: Normal Neck: No JVD Pulmonary: Clear to auscultation bilaterally Cardiac: Regular Rate and Rhythm Skin: No rash Extremity Pulses: No palpable brachial radial ulnar pulses bilaterally, thrombosed left upper arm graft Musculoskeletal: No deformity or edema  Neurologic: Upper and lower extremity motor 5/5 and symmetric  DATA:  Duplex ultrasound of the arterial anatomy in the left and right upper extremity was performed today.  There was monophasic flow in the right brachial artery with a 4 mm diameter.  There was triphasic flow in the left brachial artery with 4 mm diameter.  There was monophasic flow in the forearm on the left side.  Vein mapping ultrasound was also performed today.  Right  basilic vein was 3 to 4 mm in diameter.  Left upper arm cephalic vein was 3 mm in diameter.  There is a portion of the vein which crosses underneath the AV graft in the upper arm.  Left basilic vein was small.  ASSESSMENT: Patient needs long-term hemodialysis access.  He has a 3 mm left cephalic vein.  He did have some mild steal previously and will have some risk of recurrent steal in this arm.  However, I believe this represents his best possible option for access currently.  I discussed with the patient and his wife today risks of steal and they will call me if he has any problems postoperatively.  Other risks including but not limited to bleeding infection not maturation of the fistula were discussed.  Additionally since the cephalic vein crosses under the pre-existing thrombosed AV graft this segment of graft may have to be removed if it inhibits fistula maturation.   PLAN: Left brachiocephalic AV fistula scheduled for Tuesday, December 10, 2020.  Potentially I may have to remove portion of the AV graft as well if it compresses the outflow of the left cephalic vein.   Ruta Hinds, MD [facility] Office: [phone]

## 2020-12-06 DIAGNOSIS — N2581 Secondary hyperparathyroidism of renal origin: Secondary | ICD-10-CM | POA: Diagnosis not present

## 2020-12-06 DIAGNOSIS — N186 End stage renal disease: Secondary | ICD-10-CM | POA: Diagnosis not present

## 2020-12-06 DIAGNOSIS — Z992 Dependence on renal dialysis: Secondary | ICD-10-CM | POA: Diagnosis not present

## 2020-12-09 ENCOUNTER — Encounter (HOSPITAL_COMMUNITY): Payer: Self-pay | Admitting: Vascular Surgery

## 2020-12-09 ENCOUNTER — Other Ambulatory Visit: Payer: Self-pay | Admitting: Internal Medicine

## 2020-12-09 DIAGNOSIS — N186 End stage renal disease: Secondary | ICD-10-CM | POA: Diagnosis not present

## 2020-12-09 DIAGNOSIS — N2581 Secondary hyperparathyroidism of renal origin: Secondary | ICD-10-CM | POA: Diagnosis not present

## 2020-12-09 DIAGNOSIS — Z992 Dependence on renal dialysis: Secondary | ICD-10-CM | POA: Diagnosis not present

## 2020-12-09 NOTE — Progress Notes (Addendum)
Mr. Denning is hard of hearing, patient asked me to speak to his wife Keath Matera. Mrs. Mckay reports that  Mr. Patient has not had any chest pain or shortness of breath.  Mr. Garringer denies having any s/s of Covid in the home, patient denies any known exposure to Covid.  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 Marik reports that Mr. Gravlin CBGs run 98-100. I gave instructions to hold Januvia tonight and to take 1/2 of Levimer  dose at hs; patient should take 8 units.at hs today.  I instructed to have patient check CBG after awaking and every 2 hours until arrival  to the hospital.  I Instructed if CBG is less than 70 to take 4 Glucose Tablets or 1 tube of Glucose Gel or 1/2 cup of a clear juice, apple or cranberry. Recheck CBG in 15 minutes if CBG is not over 70 call, pre- op desk at [phone] for further instructions.

## 2020-12-10 ENCOUNTER — Ambulatory Visit (HOSPITAL_COMMUNITY)
Admission: RE | Admit: 2020-12-10 | Discharge: 2020-12-10 | Disposition: A | Discharge status: Home / Self Care | Payer: Medicare HMO | Attending: Vascular Surgery | Admitting: Vascular Surgery

## 2020-12-10 ENCOUNTER — Ambulatory Visit (HOSPITAL_COMMUNITY): Payer: Medicare HMO | Admitting: Anesthesiology

## 2020-12-10 ENCOUNTER — Encounter (HOSPITAL_COMMUNITY)
Admission: RE | Disposition: A | Discharge status: Home / Self Care | Payer: Self-pay | Source: Home / Self Care | Attending: Vascular Surgery

## 2020-12-10 DIAGNOSIS — Z79890 Hormone replacement therapy (postmenopausal): Secondary | ICD-10-CM | POA: Diagnosis not present

## 2020-12-10 DIAGNOSIS — Z79899 Other long term (current) drug therapy: Secondary | ICD-10-CM | POA: Insufficient documentation

## 2020-12-10 DIAGNOSIS — N186 End stage renal disease: Secondary | ICD-10-CM | POA: Insufficient documentation

## 2020-12-10 DIAGNOSIS — Z7982 Long term (current) use of aspirin: Secondary | ICD-10-CM | POA: Diagnosis not present

## 2020-12-10 DIAGNOSIS — Z87891 Personal history of nicotine dependence: Secondary | ICD-10-CM | POA: Diagnosis not present

## 2020-12-10 DIAGNOSIS — Z794 Long term (current) use of insulin: Secondary | ICD-10-CM | POA: Diagnosis not present

## 2020-12-10 DIAGNOSIS — E785 Hyperlipidemia, unspecified: Secondary | ICD-10-CM | POA: Insufficient documentation

## 2020-12-10 DIAGNOSIS — E1122 Type 2 diabetes mellitus with diabetic chronic kidney disease: Secondary | ICD-10-CM | POA: Insufficient documentation

## 2020-12-10 DIAGNOSIS — N185 Chronic kidney disease, stage 5: Secondary | ICD-10-CM | POA: Diagnosis not present

## 2020-12-10 DIAGNOSIS — Z7984 Long term (current) use of oral hypoglycemic drugs: Secondary | ICD-10-CM | POA: Diagnosis not present

## 2020-12-10 DIAGNOSIS — D509 Iron deficiency anemia, unspecified: Secondary | ICD-10-CM | POA: Diagnosis not present

## 2020-12-10 DIAGNOSIS — Z992 Dependence on renal dialysis: Secondary | ICD-10-CM | POA: Insufficient documentation

## 2020-12-10 DIAGNOSIS — I120 Hypertensive chronic kidney disease with stage 5 chronic kidney disease or end stage renal disease: Secondary | ICD-10-CM | POA: Diagnosis not present

## 2020-12-10 HISTORY — DX: End stage renal disease: N18.6

## 2020-12-10 HISTORY — PX: AV FISTULA PLACEMENT: SHX1204

## 2020-12-10 HISTORY — DX: Unspecified hearing loss, unspecified ear: H91.90

## 2020-12-10 LAB — POCT I-STAT, CHEM 8
BUN: 24 mg/dL — ABNORMAL HIGH (ref 8–23)
Calcium, Ion: 0.83 mmol/L — CL (ref 1.15–1.40)
Chloride: 102 mmol/L (ref 98–111)
Creatinine, Ser: 7.5 mg/dL — ABNORMAL HIGH (ref 0.61–1.24)
Glucose, Bld: 135 mg/dL — ABNORMAL HIGH (ref 70–99)
HCT: 36 % — ABNORMAL LOW (ref 39.0–52.0)
Hemoglobin: 12.2 g/dL — ABNORMAL LOW (ref 13.0–17.0)
Potassium: 5.8 mmol/L — ABNORMAL HIGH (ref 3.5–5.1)
Sodium: 132 mmol/L — ABNORMAL LOW (ref 135–145)
TCO2: 26 mmol/L (ref 22–32)

## 2020-12-10 LAB — GLUCOSE, CAPILLARY
Glucose-Capillary: 126 mg/dL — ABNORMAL HIGH (ref 70–99)
Glucose-Capillary: 196 mg/dL — ABNORMAL HIGH (ref 70–99)

## 2020-12-10 SURGERY — ARTERIOVENOUS (AV) FISTULA CREATION
Anesthesia: Monitor Anesthesia Care | Site: Arm Upper | Laterality: Left

## 2020-12-10 MED ORDER — PROPOFOL 500 MG/50ML IV EMUL
INTRAVENOUS | Status: DC | PRN
  Administered 2020-12-10: 40 ug/kg/min via INTRAVENOUS

## 2020-12-10 MED ORDER — PROTAMINE SULFATE 10 MG/ML IV SOLN
INTRAVENOUS | Status: AC
  Filled 2020-12-10: qty 5

## 2020-12-10 MED ORDER — PROTAMINE SULFATE 10 MG/ML IV SOLN
INTRAVENOUS | Status: DC | PRN
  Administered 2020-12-10: 50 mg via INTRAVENOUS

## 2020-12-10 MED ORDER — SODIUM CHLORIDE 0.9 % IV SOLN: INTRAVENOUS | Status: DC

## 2020-12-10 MED ORDER — HEPARIN SODIUM (PORCINE) 1000 UNIT/ML IJ SOLN
INTRAMUSCULAR | Status: DC | PRN
  Administered 2020-12-10: 5000 [IU] via INTRAVENOUS

## 2020-12-10 MED ORDER — CHLORHEXIDINE GLUCONATE 0.12 % MT SOLN: 15.0000 mL | Freq: Once | OROMUCOSAL | Status: AC

## 2020-12-10 MED ORDER — CEFAZOLIN SODIUM-DEXTROSE 2-4 GM/100ML-% IV SOLN
2.0000 g | INTRAVENOUS | Status: AC
  Administered 2020-12-10: 2 g via INTRAVENOUS

## 2020-12-10 MED ORDER — FENTANYL CITRATE (PF) 100 MCG/2ML IJ SOLN
INTRAMUSCULAR | Status: AC
  Administered 2020-12-10: 50 ug via INTRAVENOUS
  Filled 2020-12-10: qty 2

## 2020-12-10 MED ORDER — PROPOFOL 10 MG/ML IV BOLUS
INTRAVENOUS | Status: AC
  Filled 2020-12-10: qty 20

## 2020-12-10 MED ORDER — LIDOCAINE HCL (PF) 1 % IJ SOLN
INTRAMUSCULAR | Status: DC | PRN
  Administered 2020-12-10: 5 mL

## 2020-12-10 MED ORDER — HEPARIN 6000 UNIT IRRIGATION SOLUTION
Status: AC
  Filled 2020-12-10: qty 500

## 2020-12-10 MED ORDER — LIDOCAINE-EPINEPHRINE (PF) 1.5 %-1:200000 IJ SOLN
INTRAMUSCULAR | Status: DC | PRN
  Administered 2020-12-10: 25 mL via PERINEURAL

## 2020-12-10 MED ORDER — CHLORHEXIDINE GLUCONATE 4 % EX LIQD: 60.0000 mL | Freq: Once | CUTANEOUS | Status: DC

## 2020-12-10 MED ORDER — PHENYLEPHRINE 40 MCG/ML (10ML) SYRINGE FOR IV PUSH (FOR BLOOD PRESSURE SUPPORT)
PREFILLED_SYRINGE | INTRAVENOUS | Status: AC
  Filled 2020-12-10: qty 10

## 2020-12-10 MED ORDER — 0.9 % SODIUM CHLORIDE (POUR BTL) OPTIME
TOPICAL | Status: DC | PRN
  Administered 2020-12-10: 1000 mL

## 2020-12-10 MED ORDER — CHLORHEXIDINE GLUCONATE 0.12 % MT SOLN
OROMUCOSAL | Status: AC
  Administered 2020-12-10: 15 mL via OROMUCOSAL
  Filled 2020-12-10: qty 15

## 2020-12-10 MED ORDER — ORAL CARE MOUTH RINSE: 15.0000 mL | Freq: Once | OROMUCOSAL | Status: AC

## 2020-12-10 MED ORDER — FENTANYL CITRATE (PF) 100 MCG/2ML IJ SOLN
50.0000 ug | Freq: Once | INTRAMUSCULAR | Status: AC
Start: 2020-12-10 — End: 2020-12-10
  Filled 2020-12-10: qty 1

## 2020-12-10 MED ORDER — CEFAZOLIN SODIUM-DEXTROSE 2-4 GM/100ML-% IV SOLN
INTRAVENOUS | Status: AC
  Filled 2020-12-10: qty 100

## 2020-12-10 MED ORDER — EPHEDRINE SULFATE 50 MG/ML IJ SOLN
INTRAMUSCULAR | Status: DC | PRN
  Administered 2020-12-10 (×5): 5 mg via INTRAVENOUS

## 2020-12-10 MED ORDER — MIDAZOLAM HCL 2 MG/2ML IJ SOLN
1.0000 mg | Freq: Once | INTRAMUSCULAR | Status: AC
  Filled 2020-12-10: qty 1

## 2020-12-10 MED ORDER — MIDAZOLAM HCL 2 MG/2ML IJ SOLN
INTRAMUSCULAR | Status: AC
  Administered 2020-12-10: 1 mg via INTRAVENOUS
  Filled 2020-12-10: qty 2

## 2020-12-10 MED ORDER — PHENYLEPHRINE HCL (PRESSORS) 10 MG/ML IV SOLN
INTRAVENOUS | Status: DC | PRN
  Administered 2020-12-10: 80 ug via INTRAVENOUS
  Administered 2020-12-10 (×2): 40 ug via INTRAVENOUS

## 2020-12-10 MED ORDER — HEPARIN 6000 UNIT IRRIGATION SOLUTION
Status: DC | PRN
  Administered 2020-12-10: 1

## 2020-12-10 MED ORDER — LIDOCAINE HCL (PF) 1 % IJ SOLN
INTRAMUSCULAR | Status: AC
  Filled 2020-12-10: qty 30

## 2020-12-10 MED ORDER — EPHEDRINE 5 MG/ML INJ
INTRAVENOUS | Status: AC
  Filled 2020-12-10: qty 5

## 2020-12-10 MED ORDER — HYDROCODONE-ACETAMINOPHEN 5-325 MG PO TABS: 1.0000 | ORAL_TABLET | Freq: Four times a day (QID) | ORAL | 0 refills | Status: DC | PRN

## 2020-12-10 MED ORDER — LIDOCAINE-EPINEPHRINE 1 %-1:100000 IJ SOLN
INTRAMUSCULAR | Status: AC
  Filled 2020-12-10: qty 1

## 2020-12-10 SURGICAL SUPPLY — 32 items
ARMBAND PINK RESTRICT EXTREMIT (MISCELLANEOUS) ×4 IMPLANT
BAG COUNTER SPONGE SURGICOUNT (BAG) ×2 IMPLANT
CANISTER SUCT 3000ML PPV (MISCELLANEOUS) ×2 IMPLANT
CANNULA VESSEL 3MM 2 BLNT TIP (CANNULA) ×2 IMPLANT
CLIP VESOCCLUDE MED 6/CT (CLIP) ×2 IMPLANT
CLIP VESOCCLUDE SM WIDE 6/CT (CLIP) ×2 IMPLANT
COVER PROBE W GEL 5X96 (DRAPES) ×2 IMPLANT
DERMABOND ADVANCED (GAUZE/BANDAGES/DRESSINGS) ×1
DERMABOND ADVANCED .7 DNX12 (GAUZE/BANDAGES/DRESSINGS) ×1 IMPLANT
DRAIN PENROSE 1/4X12 LTX STRL (WOUND CARE) ×2 IMPLANT
ELECT REM PT RETURN 9FT ADLT (ELECTROSURGICAL) ×2
ELECTRODE REM PT RTRN 9FT ADLT (ELECTROSURGICAL) ×1 IMPLANT
GAUZE 4X4 16PLY ~~LOC~~+RFID DBL (SPONGE) ×2 IMPLANT
GLOVE SURG ENC MOIS LTX SZ7.5 (GLOVE) ×2 IMPLANT
GOWN STRL REUS W/ TWL LRG LVL3 (GOWN DISPOSABLE) ×2 IMPLANT
GOWN STRL REUS W/TWL LRG LVL3 (GOWN DISPOSABLE) ×2
KIT BASIN OR (CUSTOM PROCEDURE TRAY) ×2 IMPLANT
KIT TURNOVER KIT B (KITS) ×2 IMPLANT
LOOP VESSEL MINI RED (MISCELLANEOUS) ×2 IMPLANT
NS IRRIG 1000ML POUR BTL (IV SOLUTION) ×2 IMPLANT
PACK CV ACCESS (CUSTOM PROCEDURE TRAY) ×2 IMPLANT
PAD ARMBOARD 7.5X6 YLW CONV (MISCELLANEOUS) ×4 IMPLANT
SPONGE SURGIFOAM ABS GEL 100 (HEMOSTASIS) IMPLANT
SPONGE T-LAP 18X18 ~~LOC~~+RFID (SPONGE) ×2 IMPLANT
SUT PROLENE 6 0 CC (SUTURE) ×6 IMPLANT
SUT PROLENE 7 0 BV 1 (SUTURE) IMPLANT
SUT VIC AB 3-0 SH 27 (SUTURE) ×1
SUT VIC AB 3-0 SH 27X BRD (SUTURE) ×1 IMPLANT
SUT VICRYL 4-0 PS2 18IN ABS (SUTURE) ×2 IMPLANT
TOWEL GREEN STERILE (TOWEL DISPOSABLE) ×2 IMPLANT
UNDERPAD 30X36 HEAVY ABSORB (UNDERPADS AND DIAPERS) ×2 IMPLANT
WATER STERILE IRR 1000ML POUR (IV SOLUTION) ×2 IMPLANT

## 2020-12-10 NOTE — Progress Notes (Signed)
Entered patient's room and block underway.  Pulled medications and they were administered by Sharrie Rothman, CRNA.  Patient on monitor and O2 administered.

## 2020-12-10 NOTE — Progress Notes (Signed)
Patient's iCa resulted at 0.83, notified Dr. Glennon Mac.

## 2020-12-10 NOTE — Progress Notes (Signed)
Orthopedic Tech Progress Note Patient Details:  Joseph Mcpherson [birth year] [id number]  Ortho Devices Type of Ortho Device: Arm sling Ortho Device/Splint Location: Left Arm Ortho Device/Splint Interventions: Application   Post Interventions Patient Tolerated: Well  Joseph Mcpherson Joseph Mcpherson 12/10/2020, 3:15 PM

## 2020-12-10 NOTE — Anesthesia Preprocedure Evaluation (Signed)
Anesthesia Evaluation  Patient identified by MRN, date of birth, ID band Patient awake    Reviewed: Allergy & Precautions, H&P , NPO status , Patient's Chart, lab work & pertinent test results  Airway Mallampati: II   Neck ROM: full    Dental   Pulmonary neg pulmonary ROS,    breath sounds clear to auscultation       Cardiovascular hypertension, + Peripheral Vascular Disease   Rhythm:regular Rate:Normal     Neuro/Psych  Headaches, PSYCHIATRIC DISORDERS Dementia CVA    GI/Hepatic GERD  ,  Endo/Other  diabetesHypothyroidism   Renal/GU ESRF and DialysisRenal disease     Musculoskeletal   Abdominal   Peds  Hematology   Anesthesia Other Findings   Reproductive/Obstetrics                             Anesthesia Physical Anesthesia Plan  ASA: 4  Anesthesia Plan: MAC and Regional   Post-op Pain Management:    Induction: Intravenous  PONV Risk Score and Plan: 1 and Propofol infusion and Treatment may vary due to age or medical condition  Airway Management Planned: Simple Face Mask  Additional Equipment:   Intra-op Plan:   Post-operative Plan:   Informed Consent: I have reviewed the patients History and Physical, chart, labs and discussed the procedure including the risks, benefits and alternatives for the proposed anesthesia with the patient or authorized representative who has indicated his/her understanding and acceptance.     Dental advisory given  Plan Discussed with: CRNA, Anesthesiologist and Surgeon  Anesthesia Plan Comments:         Anesthesia Quick Evaluation

## 2020-12-10 NOTE — Interval H&P Note (Signed)
History and Physical Interval Note:  12/10/2020 12:50 PM  Joseph Mcpherson  has presented today for surgery, with the diagnosis of ESRD.  The various methods of treatment have been discussed with the patient and family. After consideration of risks, benefits and other options for treatment, the patient has consented to  Procedure(s): LEFT ARM BRACHIOCEPHALIC ARTERIOVENOUS (AV) FISTULA CREATION (Left) as a surgical intervention.  The patient's history has been reviewed, patient examined, no change in status, stable for surgery.  I have reviewed the patient's chart and labs.  Questions were answered to the patient's satisfaction.     Ruta Hinds

## 2020-12-10 NOTE — Transfer of Care (Signed)
Immediate Anesthesia Transfer of Care Note  Patient: Joseph Mcpherson  Procedure(s) Performed: LEFT ARM BRACHIOCEPHALIC ARTERIOVENOUS (AV) FISTULA CREATION (Left: Arm Upper)  Patient Location: PACU  Anesthesia Type:MAC combined with regional for post-op pain  Level of Consciousness: awake, alert  and oriented  Airway & Oxygen Therapy: Patient Spontanous Breathing  Post-op Assessment: Report given to RN, Post -op Vital signs reviewed and stable and Patient moving all extremities X 4  Post vital signs: Reviewed and stable  Last Vitals:  Vitals Value Taken Time  BP 106/67 12/10/20 1447  Temp    Pulse 65 12/10/20 1449  Resp 6 12/10/20 1449  SpO2 100 % 12/10/20 1449  Vitals shown include unvalidated device data.  Last Pain:  Vitals:   12/10/20 1135  TempSrc:   PainSc: 0-No pain         Complications: No notable events documented.

## 2020-12-10 NOTE — Anesthesia Procedure Notes (Signed)
Anesthesia Regional Block: Supraclavicular block   Pre-Anesthetic Checklist: , timeout performed,  Correct Patient, Correct Site, Correct Laterality,  Correct Procedure, Correct Position, site marked,  Risks and benefits discussed,  Surgical consent,  Pre-op evaluation,  At surgeon's request and post-op pain management  Laterality: Left  Prep: chloraprep       Needles:  Injection technique: Single-shot  Needle Type: Echogenic Stimulator Needle     Needle Length: 5cm  Needle Gauge: 22     Additional Needles:   Procedures:, nerve stimulator,,, ultrasound used (permanent image in chart),,     Nerve Stimulator or Paresthesia:  Response: biceps flexion, 0.45 mA  Additional Responses:   Narrative:  Start time: 12/10/2020 12:55 PM End time: 12/10/2020 1:03 PM Injection made incrementally with aspirations every 5 mL.  Performed by: Personally  Anesthesiologist: Albertha Ghee, MD  Additional Notes: Functioning IV was confirmed and monitors were applied.  A 30mm 22ga Arrow echogenic stimulator needle was used. Sterile prep and drape,hand hygiene and sterile gloves were used.  Negative aspiration and negative test dose prior to incremental administration of local anesthetic. The patient tolerated the procedure well.  Ultrasound guidance: relevent anatomy identified, needle position confirmed, local anesthetic spread visualized around nerve(s), vascular puncture avoided.  Image printed for medical record.

## 2020-12-10 NOTE — Op Note (Signed)
Procedure: Left brachiocephalic AV fistula  Preoperative diagnosis: End-stage renal disease  Postoperative diagnosis: Same  Anesthesia: Block with local anesthesia  Assistant: Arlee Muslim, PA-C for assistance with exposure and expediting procedure  Operative findings: 3.5 mm cephalic vein  Operative details: After obtaining informed consent, the patient was taken the operating.  The patient was placed in supine position on the operating table.  After placement of a upper extremity block by the anesthesia team the left upper extremities prepped and draped in usual sterile fashion.  Local anesthesia was entered into the antecubital crease.  Ultrasound was used to identify the course of the left cephalic vein this is about 3 and half millimeters in diameter.  Transverse incision was made in the antecubital area carried down through the subcutaneous tissues down the level cephalic vein.  It was dissected free circumferentially.  It was of good quality.  Small side branches were ligated and divided between silk ties.  Next the brachial artery was dissected free in the medial portion incision.  It was fairly thickened on palpation but did have a pulse within it.  It was dissected free circumferentially and Vesseloops placed proximal distal to the planned site of arteriotomy.  The patient was then given 5000 units of intravenous heparin.  Vesseloops were used to control the artery proximally distally.  The distal cephalic vein was ligated with a 2-0 silk tie and transected.  The vein was swung over the level of the artery and controlled proximally with fine bulldog clamp.  It was gently distended with heparinized saline.  A longitudinal opening was made in the brachial artery and the vein sewn end of vein to side of artery using a running 6-0 Prolene suture.  Just prior to completion of the anastomosis it was for blood backbled and thoroughly flushed.  The anastomosis was secured clamps released there is  palpable thrill in the fistula immediately.  1 repair stitch was placed.  Hemostasis was obtained with direct pressure and 50 mg of protamine.  The subcutaneous tissues were reapproximated using a running 3-0 Vicryl suture.  The skin was closed with a 4-0 Vicryl subcuticular stitch.  Dermabond was applied.  The patient tolerated the procedure well and there were no complications.  The instrument sponge and needle counts correct in the case.  Patient was taken the recovery room in stable condition.  Ruta Hinds, MD [facility] Office: [phone]

## 2020-12-10 NOTE — Discharge Instructions (Signed)
° °  [facility] ° °Discharge Instructions ° °AV Fistula or Graft Surgery for Dialysis Access ° °Please refer to the following instructions for your post-procedure care. Your surgeon or physician assistant will discuss any changes with you. ° °Activity ° °You may drive the day following your surgery, if you are comfortable and no longer taking prescription pain medication. Resume full activity as the soreness in your incision resolves. ° °Bathing/Showering ° °You may shower after you go home. Keep your incision dry for 48 hours. Do not soak in a bathtub, hot tub, or swim until the incision heals completely. You may not shower if you have a hemodialysis catheter. ° °Incision Care ° °Clean your incision with mild soap and water after 48 hours. Pat the area dry with a clean towel. You do not need a bandage unless otherwise instructed. Do not apply any ointments or creams to your incision. You may have skin glue on your incision. Do not peel it off. It will come off on its own in about one week. Your arm may swell a bit after surgery. To reduce swelling use pillows to elevate your arm so it is above your heart. Your doctor will tell you if you need to lightly wrap your arm with an ACE bandage. ° °Diet ° °Resume your normal diet. There are not special food restrictions following this procedure. In order to heal from your surgery, it is CRITICAL to get adequate nutrition. Your body requires vitamins, minerals, and protein. Vegetables are the best source of vitamins and minerals. Vegetables also provide the perfect balance of protein. Processed food has little nutritional value, so try to avoid this. ° °Medications ° °Resume taking all of your medications. If your incision is causing pain, you may take over-the counter pain relievers such as acetaminophen (Tylenol). If you were prescribed a stronger pain medication, please be aware these medications can cause nausea and constipation. Prevent  nausea by taking the medication with a snack or meal. Avoid constipation by drinking plenty of fluids and eating foods with high amount of fiber, such as fruits, vegetables, and grains. Do not take Tylenol if you are taking prescription pain medications. ° ° ° ° °Follow up °Your surgeon may want to see you in the office following your access surgery. If so, this will be arranged at the time of your surgery. ° °Please call us immediately for any of the following conditions: ° °Increased pain, redness, drainage (pus) from your incision site °Fever of 101 degrees or higher °Severe or worsening pain at your incision site °Hand pain or numbness. ° °Reduce your risk of vascular disease: ° °Stop smoking. If you would like help, call [facility] at 1-800-QUIT-NOW (1-800-784-8669) or [place] at [phone] ° °Manage your cholesterol °Maintain a desired weight °Control your diabetes °Keep your blood pressure down ° °Dialysis ° °It will take several weeks to several months for your new dialysis access to be ready for use. Your surgeon will determine when it is OK to use it. Your nephrologist will continue to direct your dialysis. You can continue to use your Permcath until your new access is ready for use. ° °If you have any questions, please call the office at [phone]. ° °

## 2020-12-10 NOTE — Anesthesia Procedure Notes (Signed)
Procedure Name: MAC Date/Time: 12/10/2020 1:15 PM Performed by: Inda Coke, CRNA Pre-anesthesia Checklist: Patient identified, Emergency Drugs available, Suction available, Timeout performed and Patient being monitored Patient Re-evaluated:Patient Re-evaluated prior to induction Oxygen Delivery Method: Simple face mask Preoxygenation: Pre-oxygenation with 100% oxygen Induction Type: IV induction Dental Injury: Teeth and Oropharynx as per pre-operative assessment

## 2020-12-11 ENCOUNTER — Encounter (HOSPITAL_COMMUNITY): Payer: Self-pay | Admitting: Vascular Surgery

## 2020-12-11 DIAGNOSIS — N186 End stage renal disease: Secondary | ICD-10-CM | POA: Diagnosis not present

## 2020-12-11 DIAGNOSIS — N2581 Secondary hyperparathyroidism of renal origin: Secondary | ICD-10-CM | POA: Diagnosis not present

## 2020-12-11 DIAGNOSIS — Z992 Dependence on renal dialysis: Secondary | ICD-10-CM | POA: Diagnosis not present

## 2020-12-11 NOTE — Anesthesia Postprocedure Evaluation (Signed)
Anesthesia Post Note  Patient: Joseph Mcpherson  Procedure(s) Performed: LEFT ARM BRACHIOCEPHALIC ARTERIOVENOUS (AV) FISTULA CREATION (Left: Arm Upper)     Patient location during evaluation: PACU Anesthesia Type: Regional Level of consciousness: awake Pain management: pain level controlled Vital Signs Assessment: post-procedure vital signs reviewed and stable Respiratory status: spontaneous breathing, nonlabored ventilation, respiratory function stable and patient connected to nasal cannula oxygen Cardiovascular status: stable and blood pressure returned to baseline Postop Assessment: no apparent nausea or vomiting Anesthetic complications: no   No notable events documented.  Last Vitals:  Vitals:   12/10/20 1500 12/10/20 1515  BP: (!) 122/56 (!) 125/54  Pulse: 66 62  Resp: 17 13  Temp:  36.6 C  SpO2: 100% 100%    Last Pain:  Vitals:   12/10/20 1515  TempSrc:   PainSc: 0-No pain                 Arslan Kier P Tayt Moyers

## 2020-12-13 ENCOUNTER — Other Ambulatory Visit: Payer: Self-pay | Admitting: Internal Medicine

## 2020-12-13 DIAGNOSIS — N2581 Secondary hyperparathyroidism of renal origin: Secondary | ICD-10-CM | POA: Diagnosis not present

## 2020-12-13 DIAGNOSIS — Z992 Dependence on renal dialysis: Secondary | ICD-10-CM | POA: Diagnosis not present

## 2020-12-13 DIAGNOSIS — N186 End stage renal disease: Secondary | ICD-10-CM | POA: Diagnosis not present

## 2020-12-16 DIAGNOSIS — N186 End stage renal disease: Secondary | ICD-10-CM | POA: Diagnosis not present

## 2020-12-16 DIAGNOSIS — Z992 Dependence on renal dialysis: Secondary | ICD-10-CM | POA: Diagnosis not present

## 2020-12-16 DIAGNOSIS — N2581 Secondary hyperparathyroidism of renal origin: Secondary | ICD-10-CM | POA: Diagnosis not present

## 2020-12-18 DIAGNOSIS — N2581 Secondary hyperparathyroidism of renal origin: Secondary | ICD-10-CM | POA: Diagnosis not present

## 2020-12-18 DIAGNOSIS — N186 End stage renal disease: Secondary | ICD-10-CM | POA: Diagnosis not present

## 2020-12-18 DIAGNOSIS — Z992 Dependence on renal dialysis: Secondary | ICD-10-CM | POA: Diagnosis not present

## 2020-12-20 DIAGNOSIS — Z992 Dependence on renal dialysis: Secondary | ICD-10-CM | POA: Diagnosis not present

## 2020-12-20 DIAGNOSIS — N186 End stage renal disease: Secondary | ICD-10-CM | POA: Diagnosis not present

## 2020-12-20 DIAGNOSIS — N2581 Secondary hyperparathyroidism of renal origin: Secondary | ICD-10-CM | POA: Diagnosis not present

## 2020-12-22 DIAGNOSIS — N186 End stage renal disease: Secondary | ICD-10-CM | POA: Diagnosis not present

## 2020-12-22 DIAGNOSIS — Z992 Dependence on renal dialysis: Secondary | ICD-10-CM | POA: Diagnosis not present

## 2020-12-22 DIAGNOSIS — E1122 Type 2 diabetes mellitus with diabetic chronic kidney disease: Secondary | ICD-10-CM | POA: Diagnosis not present

## 2020-12-23 ENCOUNTER — Other Ambulatory Visit: Payer: Self-pay

## 2020-12-23 DIAGNOSIS — N186 End stage renal disease: Secondary | ICD-10-CM

## 2020-12-23 DIAGNOSIS — Z992 Dependence on renal dialysis: Secondary | ICD-10-CM | POA: Diagnosis not present

## 2020-12-23 DIAGNOSIS — N2581 Secondary hyperparathyroidism of renal origin: Secondary | ICD-10-CM | POA: Diagnosis not present

## 2020-12-25 DIAGNOSIS — N186 End stage renal disease: Secondary | ICD-10-CM | POA: Diagnosis not present

## 2020-12-25 DIAGNOSIS — Z992 Dependence on renal dialysis: Secondary | ICD-10-CM | POA: Diagnosis not present

## 2020-12-25 DIAGNOSIS — N2581 Secondary hyperparathyroidism of renal origin: Secondary | ICD-10-CM | POA: Diagnosis not present

## 2020-12-27 DIAGNOSIS — Z992 Dependence on renal dialysis: Secondary | ICD-10-CM | POA: Diagnosis not present

## 2020-12-27 DIAGNOSIS — N186 End stage renal disease: Secondary | ICD-10-CM | POA: Diagnosis not present

## 2020-12-27 DIAGNOSIS — N2581 Secondary hyperparathyroidism of renal origin: Secondary | ICD-10-CM | POA: Diagnosis not present

## 2020-12-30 DIAGNOSIS — N186 End stage renal disease: Secondary | ICD-10-CM | POA: Diagnosis not present

## 2020-12-30 DIAGNOSIS — N2581 Secondary hyperparathyroidism of renal origin: Secondary | ICD-10-CM | POA: Diagnosis not present

## 2020-12-30 DIAGNOSIS — Z992 Dependence on renal dialysis: Secondary | ICD-10-CM | POA: Diagnosis not present

## 2021-01-01 DIAGNOSIS — N2581 Secondary hyperparathyroidism of renal origin: Secondary | ICD-10-CM | POA: Diagnosis not present

## 2021-01-01 DIAGNOSIS — Z992 Dependence on renal dialysis: Secondary | ICD-10-CM | POA: Diagnosis not present

## 2021-01-01 DIAGNOSIS — N186 End stage renal disease: Secondary | ICD-10-CM | POA: Diagnosis not present

## 2021-01-02 ENCOUNTER — Encounter: Payer: Medicare HMO | Admitting: Vascular Surgery

## 2021-01-02 ENCOUNTER — Other Ambulatory Visit (HOSPITAL_COMMUNITY): Payer: Medicare HMO

## 2021-01-03 DIAGNOSIS — Z992 Dependence on renal dialysis: Secondary | ICD-10-CM | POA: Diagnosis not present

## 2021-01-03 DIAGNOSIS — N186 End stage renal disease: Secondary | ICD-10-CM | POA: Diagnosis not present

## 2021-01-03 DIAGNOSIS — N2581 Secondary hyperparathyroidism of renal origin: Secondary | ICD-10-CM | POA: Diagnosis not present

## 2021-01-04 ENCOUNTER — Other Ambulatory Visit: Payer: Self-pay | Admitting: Internal Medicine

## 2021-01-06 DIAGNOSIS — N2581 Secondary hyperparathyroidism of renal origin: Secondary | ICD-10-CM | POA: Diagnosis not present

## 2021-01-06 DIAGNOSIS — N186 End stage renal disease: Secondary | ICD-10-CM | POA: Diagnosis not present

## 2021-01-06 DIAGNOSIS — Z992 Dependence on renal dialysis: Secondary | ICD-10-CM | POA: Diagnosis not present

## 2021-01-07 ENCOUNTER — Other Ambulatory Visit: Payer: Self-pay | Admitting: Internal Medicine

## 2021-01-08 DIAGNOSIS — Z992 Dependence on renal dialysis: Secondary | ICD-10-CM | POA: Diagnosis not present

## 2021-01-08 DIAGNOSIS — N186 End stage renal disease: Secondary | ICD-10-CM | POA: Diagnosis not present

## 2021-01-08 DIAGNOSIS — N2581 Secondary hyperparathyroidism of renal origin: Secondary | ICD-10-CM | POA: Diagnosis not present

## 2021-01-10 DIAGNOSIS — N186 End stage renal disease: Secondary | ICD-10-CM | POA: Diagnosis not present

## 2021-01-10 DIAGNOSIS — Z992 Dependence on renal dialysis: Secondary | ICD-10-CM | POA: Diagnosis not present

## 2021-01-10 DIAGNOSIS — N2581 Secondary hyperparathyroidism of renal origin: Secondary | ICD-10-CM | POA: Diagnosis not present

## 2021-01-13 DIAGNOSIS — Z992 Dependence on renal dialysis: Secondary | ICD-10-CM | POA: Diagnosis not present

## 2021-01-13 DIAGNOSIS — N2581 Secondary hyperparathyroidism of renal origin: Secondary | ICD-10-CM | POA: Diagnosis not present

## 2021-01-13 DIAGNOSIS — N186 End stage renal disease: Secondary | ICD-10-CM | POA: Diagnosis not present

## 2021-01-15 DIAGNOSIS — Z992 Dependence on renal dialysis: Secondary | ICD-10-CM | POA: Diagnosis not present

## 2021-01-15 DIAGNOSIS — N2581 Secondary hyperparathyroidism of renal origin: Secondary | ICD-10-CM | POA: Diagnosis not present

## 2021-01-15 DIAGNOSIS — N186 End stage renal disease: Secondary | ICD-10-CM | POA: Diagnosis not present

## 2021-01-16 ENCOUNTER — Encounter (HOSPITAL_COMMUNITY): Payer: Medicare HMO

## 2021-01-17 DIAGNOSIS — N186 End stage renal disease: Secondary | ICD-10-CM | POA: Diagnosis not present

## 2021-01-17 DIAGNOSIS — Z992 Dependence on renal dialysis: Secondary | ICD-10-CM | POA: Diagnosis not present

## 2021-01-17 DIAGNOSIS — N2581 Secondary hyperparathyroidism of renal origin: Secondary | ICD-10-CM | POA: Diagnosis not present

## 2021-01-20 DIAGNOSIS — N186 End stage renal disease: Secondary | ICD-10-CM | POA: Diagnosis not present

## 2021-01-20 DIAGNOSIS — Z992 Dependence on renal dialysis: Secondary | ICD-10-CM | POA: Diagnosis not present

## 2021-01-20 DIAGNOSIS — N2581 Secondary hyperparathyroidism of renal origin: Secondary | ICD-10-CM | POA: Diagnosis not present

## 2021-01-21 ENCOUNTER — Other Ambulatory Visit: Payer: Self-pay

## 2021-01-21 ENCOUNTER — Ambulatory Visit (INDEPENDENT_AMBULATORY_CARE_PROVIDER_SITE_OTHER): Payer: Medicare HMO | Admitting: Vascular Surgery

## 2021-01-21 ENCOUNTER — Other Ambulatory Visit (HOSPITAL_COMMUNITY): Payer: Self-pay | Admitting: Vascular Surgery

## 2021-01-21 ENCOUNTER — Ambulatory Visit (INDEPENDENT_AMBULATORY_CARE_PROVIDER_SITE_OTHER)
Admission: RE | Admit: 2021-01-21 | Discharge: 2021-01-21 | Disposition: A | Discharge status: Home / Self Care | Payer: Medicare HMO | Source: Ambulatory Visit | Attending: Vascular Surgery | Admitting: Vascular Surgery

## 2021-01-21 ENCOUNTER — Ambulatory Visit (HOSPITAL_COMMUNITY)
Admission: RE | Admit: 2021-01-21 | Discharge: 2021-01-21 | Disposition: A | Discharge status: Home / Self Care | Payer: Medicare HMO | Source: Ambulatory Visit | Attending: Vascular Surgery | Admitting: Vascular Surgery

## 2021-01-21 ENCOUNTER — Encounter: Payer: Self-pay | Admitting: Vascular Surgery

## 2021-01-21 VITALS — BP 189/84 | HR 58 | Temp 97.6°F | Resp 16 | Ht 66.0 in | Wt 151.0 lb

## 2021-01-21 DIAGNOSIS — N186 End stage renal disease: Secondary | ICD-10-CM

## 2021-01-21 DIAGNOSIS — Z992 Dependence on renal dialysis: Secondary | ICD-10-CM | POA: Diagnosis not present

## 2021-01-21 NOTE — Progress Notes (Signed)
Patient name: Joseph Mcpherson MRN: [id number] DOB: 09/07/1945 Sex: male  REASON FOR VISIT: Postop after left brachiocephalic AV fistula with concern for steal  HPI: Joseph Mcpherson is a 75 y.o. male with history of end-stage renal disease that presents for postop check and evaluation for steal.  He recently underwent a left brachiocephalic AV fistula with Dr. Oneida Alar on 12/10/2020.  A steal study was ordered on follow-up although the patient is a little bit unclear as to why.  States his hand feels cool but he has no pain in the hand with no numbness in the hand and motor function is normal and intact.  Apparently has had steal before and underwent a left ulnar angioplasty at [facility] with a previous upper arm graft in the left arm.  Currently using a tunneled dialysis catheter.  Past Medical History:  Diagnosis Date   Clotting disorder ([place])    per daughter, "when patient travels, his feet swell"   Diabetes mellitus without complication ([place])    type II   Diabetic eye exam ([place]) 12/2019   ESRD (end stage renal disease) ([place])    MWF- [facility]   GERD (gastroesophageal reflux disease)    [name] (hard of hearing)    Hyperlipidemia    Hypertension    Hypothyroidism    Thyroid disease     Past Surgical History:  Procedure Laterality Date   AV FISTULA PLACEMENT Right 10/22/2017   Procedure: ARTERIOVENOUS (AV) BRACHIOCEPHALIC FISTULA CREATION RIGHT UPPER ARM;  Surgeon: Conrad Steele, MD;  Location: [facility];  Service: Vascular;  Laterality: Right;   AV FISTULA PLACEMENT Left 02/03/2019   Procedure: INSERTION OF ARTERIOVENOUS (AV) GORE-TEX GRAFT ARM (BRACHIAL AXILLARY );  Surgeon: [name], MD;  Location: [facility];  Service: Vascular;  Laterality: Left;   AV FISTULA PLACEMENT Left 12/10/2020   Procedure: LEFT ARM BRACHIOCEPHALIC ARTERIOVENOUS (AV) FISTULA CREATION;  Surgeon: [name], MD;  Location: [facility];  Service: Vascular;  Laterality: Left;   UPPER EXTREMITY ANGIOGRAPHY Left 09/19/2019    Procedure: UPPER EXTREMITY ANGIOGRAPHY;  Surgeon: [name], MD;  Location: [facility];  Service: Cardiovascular;  Laterality: Left;    Family History  Problem Relation Age of Onset   Diabetes Mother    Hyperlipidemia Mother    Hypertension Father     SOCIAL HISTORY: Social History   Tobacco Use   Smoking status: Never   Smokeless tobacco: Former    Types: Snuff  Substance Use Topics   Alcohol use: No    No Known Allergies  Current Outpatient Medications  Medication Sig Dispense Refill   albuterol (PROVENTIL) (2.5 MG/3ML) 0.083% nebulizer solution Take 3 mLs (2.5 mg total) by nebulization every 6 (six) hours as needed for wheezing or shortness of breath. 150 mL 1   aspirin EC 81 MG tablet Take 81 mg by mouth daily. Swallow whole.     B Complex-C-Folic Acid (RENA-VITE RX) 1 MG TABS Take 1 tablet by mouth daily.      calcium acetate (PHOSLO) 667 MG capsule TAKE 2 CAPSULES BY MOUTH 3 TIMES A DAY WITH MEAL (Patient taking differently: Take 1,334 mg by mouth 2 (two) times daily with a meal.) 180 capsule 4   Cholecalciferol (VITAMIN D3) 5000 units TABS Take 5,000 Units by mouth daily.      Continuous Blood Gluc Sensor (FREESTYLE LIBRE 14 DAY SENSOR) MISC 1 EACH BY DOES NOT APPLY ROUTE EVERY 14 (FOURTEEN) DAYS. 1 each 3   docusate sodium (COLACE)  100 MG capsule TAKE 1 CAPSULE BY MOUTH TWICE A DAY 60 capsule 1   donepezil (ARICEPT) 10 MG tablet Take 10 mg by mouth at bedtime.      gabapentin (NEURONTIN) 300 MG capsule TAKE 1 CAPSULE BY MOUTH EVERY DAY AT BEDTIME AS DIRECTED (Patient taking differently: Take 300 mg by mouth at bedtime.) 90 capsule 3   hydrALAZINE (APRESOLINE) 50 MG tablet Take 1 tablet (50 mg total) by mouth 3 (three) times daily. (Patient taking differently: Take 25 mg by mouth in the morning and at bedtime.) 90 tablet 6   HYDROcodone-acetaminophen (NORCO) 5-325 MG tablet Take 1 tablet by mouth every 6 (six) hours as needed for moderate pain. 10  tablet 0   insulin aspart (NOVOLOG) 100 UNIT/ML FlexPen Inject 10 Units into the skin at bedtime.     insulin detemir (LEVEMIR) 100 UNIT/ML injection Inject 20 Units into the skin at bedtime.     levocetirizine (XYZAL) 5 MG tablet TAKE 1 TABLET BY MOUTH EVERY DAY IN THE EVENING 90 tablet 1   levothyroxine (SYNTHROID) 137 MCG tablet Take 1 tablet (137 mcg total) by mouth daily. (Patient taking differently: Take 137 mcg by mouth daily before breakfast.) 90 tablet 3   losartan (COZAAR) 25 MG tablet Take 1 tablet (25 mg total) by mouth every evening. 90 tablet 3   metoprolol succinate (TOPROL-XL) 50 MG 24 hr tablet Take 1 tablet (50 mg total) by mouth daily. 90 tablet 3   Multiple Vitamins-Minerals (MULTIVITAMIN WITH MINERALS) tablet Take 1 tablet by mouth daily.     omeprazole (PRILOSEC) 40 MG capsule TAKE 1 CAPSULE BY MOUTH EVERY DAY 90 capsule 2   pantoprazole (PROTONIX) 40 MG tablet Take 1 tablet (40 mg total) by mouth daily. 90 tablet 3   sevelamer carbonate (RENVELA) 2.4 g PACK Take 2.4 g by mouth daily.     simvastatin (ZOCOR) 20 MG tablet Take 1 tablet (20 mg total) by mouth every evening. 90 tablet 3   sitaGLIPtin (JANUVIA) 50 MG tablet Take 50 mg by mouth every evening.      aspirin EC 325 MG EC tablet Take 1 tablet (325 mg total) by mouth daily. (Patient not taking: No sig reported) 30 tablet 3   No current facility-administered medications for this visit.    REVIEW OF SYSTEMS:  [X]  denotes positive finding, [ ]  denotes negative finding Cardiac  Comments:  Chest pain or chest pressure:    Shortness of breath upon exertion:    Short of breath when lying flat:    Irregular heart rhythm:        Vascular    Pain in calf, thigh, or hip brought on by ambulation:    Pain in feet at night that wakes you up from your sleep:     Blood clot in your veins:    Leg swelling:         Pulmonary    Oxygen at home:    Productive cough:     Wheezing:         Neurologic    Sudden weakness in  arms or legs:     Sudden numbness in arms or legs:     Sudden onset of difficulty speaking or slurred speech:    Temporary loss of vision in one eye:     Problems with dizziness:         Gastrointestinal    Blood in stool:     Vomited blood:  Genitourinary    Burning when urinating:     Blood in urine:        Psychiatric    Major depression:         Hematologic    Bleeding problems:    Problems with blood clotting too easily:        Skin    Rashes or ulcers:        Constitutional    Fever or chills:      PHYSICAL EXAM: Vitals:   01/21/21 0838  BP: (!) 189/84  Pulse: (!) 58  Resp: 16  Temp: 97.6 F (36.4 C)  TempSrc: Temporal  SpO2: 94%  Weight: 151 lb (68.5 kg)  Height: 5\' 6"  (1.676 m)    GENERAL: The patient is a well-nourished male, in no acute distress. The vital signs are documented above. CARDIAC: There is a regular rate and rhythm.  VASCULAR:  Brisk left radial signal Left palmar arch signal Cannot find left ulnar signal No left upper extremity tissue loss Left fistula has good thrill PULMONARY: There is good air exchange bilaterally without wheezing or rales. ABDOMEN: Soft and non-tender with normal pitched bowel sounds.  MUSCULOSKELETAL: There are no major deformities or cyanosis. NEUROLOGIC: No focal weakness or paresthesias are detected. SKIN: There are no ulcers or rashes noted. PSYCHIATRIC: The patient has a normal affect.  DATA:   Steal study today shows left second digit pressure of 61 increased to 102 mmHg with compression of the fistula  Fistula duplex shows the left brachiocephalic AVF is maturing nicely and the vein is over 6 mm with good flow volumes  Assessment/Plan:  75 year old male that underwent a left brachiocephalic AV fistula on 02/06/7828 by Dr. Oneida Alar.  Fistula has a good thrill.  Fistula duplex today shows this is maturing nicely.  Steal study does show increased second digit waveform and increased pressure with  compression of the fistula which I would expect.  Not really having any severe symptoms other than subjectively the feeling of a cool hand that he feels is very tolerable.  I would allow him to use the fistula in 3 months after it is fully mature as I discussed with him and his wife today.  If he has any ongoing issues with the left hand would offer repeat left upper extremity arteriogram to evaluate for any inflow disease given history of left ulnar angioplasty at [facility].   Marty Heck, MD [facility] Office: [phone]

## 2021-01-22 DIAGNOSIS — N2581 Secondary hyperparathyroidism of renal origin: Secondary | ICD-10-CM | POA: Diagnosis not present

## 2021-01-22 DIAGNOSIS — Z992 Dependence on renal dialysis: Secondary | ICD-10-CM | POA: Diagnosis not present

## 2021-01-22 DIAGNOSIS — E1122 Type 2 diabetes mellitus with diabetic chronic kidney disease: Secondary | ICD-10-CM | POA: Diagnosis not present

## 2021-01-22 DIAGNOSIS — N186 End stage renal disease: Secondary | ICD-10-CM | POA: Diagnosis not present

## 2021-01-23 ENCOUNTER — Other Ambulatory Visit: Payer: Self-pay

## 2021-01-23 DIAGNOSIS — K2100 Gastro-esophageal reflux disease with esophagitis, without bleeding: Secondary | ICD-10-CM

## 2021-01-23 MED ORDER — DOCUSATE SODIUM 100 MG PO CAPS: 100.0000 mg | ORAL_CAPSULE | Freq: Two times a day (BID) | ORAL | 1 refills | Status: DC

## 2021-01-24 DIAGNOSIS — Z992 Dependence on renal dialysis: Secondary | ICD-10-CM | POA: Diagnosis not present

## 2021-01-24 DIAGNOSIS — N2581 Secondary hyperparathyroidism of renal origin: Secondary | ICD-10-CM | POA: Diagnosis not present

## 2021-01-24 DIAGNOSIS — N186 End stage renal disease: Secondary | ICD-10-CM | POA: Diagnosis not present

## 2021-01-27 DIAGNOSIS — N2581 Secondary hyperparathyroidism of renal origin: Secondary | ICD-10-CM | POA: Diagnosis not present

## 2021-01-27 DIAGNOSIS — N186 End stage renal disease: Secondary | ICD-10-CM | POA: Diagnosis not present

## 2021-01-27 DIAGNOSIS — Z992 Dependence on renal dialysis: Secondary | ICD-10-CM | POA: Diagnosis not present

## 2021-01-29 DIAGNOSIS — N2581 Secondary hyperparathyroidism of renal origin: Secondary | ICD-10-CM | POA: Diagnosis not present

## 2021-01-29 DIAGNOSIS — Z992 Dependence on renal dialysis: Secondary | ICD-10-CM | POA: Diagnosis not present

## 2021-01-29 DIAGNOSIS — N186 End stage renal disease: Secondary | ICD-10-CM | POA: Diagnosis not present

## 2021-01-31 DIAGNOSIS — N2581 Secondary hyperparathyroidism of renal origin: Secondary | ICD-10-CM | POA: Diagnosis not present

## 2021-01-31 DIAGNOSIS — Z992 Dependence on renal dialysis: Secondary | ICD-10-CM | POA: Diagnosis not present

## 2021-01-31 DIAGNOSIS — N186 End stage renal disease: Secondary | ICD-10-CM | POA: Diagnosis not present

## 2021-02-03 DIAGNOSIS — Z992 Dependence on renal dialysis: Secondary | ICD-10-CM | POA: Diagnosis not present

## 2021-02-03 DIAGNOSIS — N2581 Secondary hyperparathyroidism of renal origin: Secondary | ICD-10-CM | POA: Diagnosis not present

## 2021-02-03 DIAGNOSIS — N186 End stage renal disease: Secondary | ICD-10-CM | POA: Diagnosis not present

## 2021-02-04 ENCOUNTER — Other Ambulatory Visit: Payer: Self-pay | Admitting: Internal Medicine

## 2021-02-04 ENCOUNTER — Other Ambulatory Visit: Payer: Self-pay | Admitting: *Deleted

## 2021-02-04 MED ORDER — FREESTYLE LIBRE 14 DAY SENSOR MISC
1.0000 | 3 refills | Status: DC
Start: 2021-02-04 — End: 2021-02-04

## 2021-02-05 DIAGNOSIS — N186 End stage renal disease: Secondary | ICD-10-CM | POA: Diagnosis not present

## 2021-02-05 DIAGNOSIS — N2581 Secondary hyperparathyroidism of renal origin: Secondary | ICD-10-CM | POA: Diagnosis not present

## 2021-02-05 DIAGNOSIS — Z992 Dependence on renal dialysis: Secondary | ICD-10-CM | POA: Diagnosis not present

## 2021-02-07 DIAGNOSIS — Z992 Dependence on renal dialysis: Secondary | ICD-10-CM | POA: Diagnosis not present

## 2021-02-07 DIAGNOSIS — N186 End stage renal disease: Secondary | ICD-10-CM | POA: Diagnosis not present

## 2021-02-07 DIAGNOSIS — N2581 Secondary hyperparathyroidism of renal origin: Secondary | ICD-10-CM | POA: Diagnosis not present

## 2021-02-10 DIAGNOSIS — N2581 Secondary hyperparathyroidism of renal origin: Secondary | ICD-10-CM | POA: Diagnosis not present

## 2021-02-10 DIAGNOSIS — N186 End stage renal disease: Secondary | ICD-10-CM | POA: Diagnosis not present

## 2021-02-10 DIAGNOSIS — Z992 Dependence on renal dialysis: Secondary | ICD-10-CM | POA: Diagnosis not present

## 2021-02-12 ENCOUNTER — Ambulatory Visit (INDEPENDENT_AMBULATORY_CARE_PROVIDER_SITE_OTHER): Payer: Medicare HMO | Admitting: Internal Medicine

## 2021-02-12 ENCOUNTER — Other Ambulatory Visit: Payer: Self-pay | Admitting: Internal Medicine

## 2021-02-12 ENCOUNTER — Other Ambulatory Visit: Payer: Self-pay

## 2021-02-12 ENCOUNTER — Encounter: Payer: Self-pay | Admitting: Internal Medicine

## 2021-02-12 VITALS — BP 156/65 | HR 60 | Ht 66.0 in | Wt 149.6 lb

## 2021-02-12 DIAGNOSIS — N2581 Secondary hyperparathyroidism of renal origin: Secondary | ICD-10-CM | POA: Diagnosis not present

## 2021-02-12 DIAGNOSIS — E0849 Diabetes mellitus due to underlying condition with other diabetic neurological complication: Secondary | ICD-10-CM | POA: Diagnosis not present

## 2021-02-12 DIAGNOSIS — E119 Type 2 diabetes mellitus without complications: Secondary | ICD-10-CM | POA: Diagnosis not present

## 2021-02-12 DIAGNOSIS — Z794 Long term (current) use of insulin: Secondary | ICD-10-CM | POA: Diagnosis not present

## 2021-02-12 DIAGNOSIS — I10 Essential (primary) hypertension: Secondary | ICD-10-CM

## 2021-02-12 DIAGNOSIS — Z992 Dependence on renal dialysis: Secondary | ICD-10-CM | POA: Diagnosis not present

## 2021-02-12 DIAGNOSIS — K2100 Gastro-esophageal reflux disease with esophagitis, without bleeding: Secondary | ICD-10-CM

## 2021-02-12 DIAGNOSIS — M1A3190 Chronic gout due to renal impairment, unspecified shoulder, without tophus (tophi): Secondary | ICD-10-CM | POA: Diagnosis not present

## 2021-02-12 DIAGNOSIS — E039 Hypothyroidism, unspecified: Secondary | ICD-10-CM | POA: Diagnosis not present

## 2021-02-12 DIAGNOSIS — N186 End stage renal disease: Secondary | ICD-10-CM | POA: Diagnosis not present

## 2021-02-12 LAB — GLUCOSE, POCT (MANUAL RESULT ENTRY): POC Glucose: 222 mg/dl — AB (ref 70–99)

## 2021-02-12 LAB — POCT GLYCOSYLATED HEMOGLOBIN (HGB A1C): Hemoglobin A1C: 7 % — AB (ref 4.0–5.6)

## 2021-02-12 MED ORDER — COLCHICINE 0.6 MG PO TABS
0.6000 mg | ORAL_TABLET | Freq: Every day | ORAL | 1 refills | Status: DC
Start: 2021-02-12 — End: 2021-06-16

## 2021-02-12 MED ORDER — COLCHICINE 0.6 MG PO CAPS: 0.6000 | ORAL_CAPSULE | Freq: Every day | ORAL | 4 refills | Status: DC

## 2021-02-12 MED ORDER — FREESTYLE LIBRE 14 DAY SENSOR MISC: 3 refills | Status: DC

## 2021-02-12 NOTE — Addendum Note (Signed)
Addended by: Alois Cliche on: 02/12/2021 04:06 PM   Modules accepted: Orders

## 2021-02-12 NOTE — Progress Notes (Signed)
Established Patient Office Visit  Subjective:  Patient ID: Joseph Mcpherson, male    DOB: 10/29/1945  Age: 75 y.o. MRN: [id number]  CC:  Chief Complaint  Patient presents with   Hypertension      .  Past Medical History:  Diagnosis Date   Clotting disorder ([place])    per daughter, "when patient travels, his feet swell"   Diabetes mellitus without complication ([place])    type II   Diabetic eye exam ([place]) 12/2019   ESRD (end stage renal disease) ([place])    MWF- [place]   GERD (gastroesophageal reflux disease)    [name] (hard of hearing)    Hyperlipidemia    Hypertension    Hypothyroidism    Thyroid disease     Past Surgical History:  Procedure Laterality Date   AV FISTULA PLACEMENT Right 10/22/2017   Procedure: ARTERIOVENOUS (AV) BRACHIOCEPHALIC FISTULA CREATION RIGHT UPPER ARM;  Surgeon: Conrad [place], MD;  Location: [place];  Service: Vascular;  Laterality: Right;   AV FISTULA PLACEMENT Left 02/03/2019   Procedure: INSERTION OF ARTERIOVENOUS (AV) GORE-TEX GRAFT ARM (BRACHIAL AXILLARY );  Surgeon: [name], MD;  Location: [facility];  Service: Vascular;  Laterality: Left;   AV FISTULA PLACEMENT Left 12/10/2020   Procedure: LEFT ARM BRACHIOCEPHALIC ARTERIOVENOUS (AV) FISTULA CREATION;  Surgeon: [name], MD;  Location: [place];  Service: Vascular;  Laterality: Left;   UPPER EXTREMITY ANGIOGRAPHY Left 09/19/2019   Procedure: UPPER EXTREMITY ANGIOGRAPHY;  Surgeon: [name], MD;  Location: [place] [facility];  Service: Cardiovascular;  Laterality: Left;    Family History  Problem Relation Age of Onset   Diabetes Mother    Hyperlipidemia Mother    Hypertension Father     Social History   Socioeconomic History   Marital status: Married    Spouse name: Not on file   Number of children: Not on file   Years of education: Not on file   Highest education level: Not on file  Occupational History   Not on file  Tobacco Use   Smoking status: Never    Smokeless tobacco: Former    Types: Snuff  Vaping Use   Vaping Use: Never used  Substance and Sexual Activity   Alcohol use: No   Drug use: No   Sexual activity: Not Currently  Other Topics Concern   Not on file  Social History Narrative   Not on file   Social Determinants of Health   Financial Resource Strain: Not on file  Food Insecurity: Not on file  Transportation Needs: Not on file  Physical Activity: Not on file  Stress: Not on file  Social Connections: Not on file  Intimate Partner Violence: Not on file     Current Outpatient Medications:    albuterol (PROVENTIL) (2.5 MG/3ML) 0.083% nebulizer solution, Take 3 mLs (2.5 mg total) by nebulization every 6 (six) hours as needed for wheezing or shortness of breath., Disp: 150 mL, Rfl: 1   aspirin EC 325 MG EC tablet, Take 1 tablet (325 mg total) by mouth daily., Disp: 30 tablet, Rfl: 3   aspirin EC 81 MG tablet, Take 81 mg by mouth daily. Swallow whole., Disp: , Rfl:    B Complex-C-Folic Acid (RENA-VITE RX) 1 MG TABS, Take 1 tablet by mouth daily. , Disp: , Rfl:    calcium acetate (PHOSLO) 667 MG capsule, TAKE 2 CAPSULES BY MOUTH 3 TIMES A DAY WITH MEAL (Patient taking differently: Take 1,334 mg by mouth  2 (two) times daily with a meal.), Disp: 180 capsule, Rfl: 4   Cholecalciferol (VITAMIN D3) 5000 units TABS, Take 5,000 Units by mouth daily. , Disp: , Rfl:    docusate sodium (COLACE) 100 MG capsule, Take 1 capsule (100 mg total) by mouth 2 (two) times daily., Disp: 60 capsule, Rfl: 1   donepezil (ARICEPT) 10 MG tablet, Take 10 mg by mouth at bedtime. , Disp: , Rfl:    gabapentin (NEURONTIN) 300 MG capsule, TAKE 1 CAPSULE BY MOUTH EVERY DAY AT BEDTIME AS DIRECTED (Patient taking differently: Take 300 mg by mouth at bedtime.), Disp: 90 capsule, Rfl: 3   hydrALAZINE (APRESOLINE) 50 MG tablet, Take 1 tablet (50 mg total) by mouth 3 (three) times daily. (Patient taking differently: Take 25 mg by mouth in the morning and at bedtime.),  Disp: 90 tablet, Rfl: 6   HYDROcodone-acetaminophen (NORCO) 5-325 MG tablet, Take 1 tablet by mouth every 6 (six) hours as needed for moderate pain., Disp: 10 tablet, Rfl: 0   insulin aspart (NOVOLOG) 100 UNIT/ML FlexPen, Inject 10 Units into the skin at bedtime., Disp: , Rfl:    insulin detemir (LEVEMIR) 100 UNIT/ML injection, Inject 20 Units into the skin at bedtime., Disp: , Rfl:    levocetirizine (XYZAL) 5 MG tablet, TAKE 1 TABLET BY MOUTH EVERY DAY IN THE EVENING, Disp: 90 tablet, Rfl: 1   levothyroxine (SYNTHROID) 137 MCG tablet, Take 1 tablet (137 mcg total) by mouth daily. (Patient taking differently: Take 137 mcg by mouth daily before breakfast.), Disp: 90 tablet, Rfl: 3   losartan (COZAAR) 25 MG tablet, Take 1 tablet (25 mg total) by mouth every evening., Disp: 90 tablet, Rfl: 3   metoprolol succinate (TOPROL-XL) 50 MG 24 hr tablet, Take 1 tablet (50 mg total) by mouth daily., Disp: 90 tablet, Rfl: 3   Multiple Vitamins-Minerals (MULTIVITAMIN WITH MINERALS) tablet, Take 1 tablet by mouth daily., Disp: , Rfl:    omeprazole (PRILOSEC) 40 MG capsule, TAKE 1 CAPSULE BY MOUTH EVERY DAY, Disp: 90 capsule, Rfl: 2   pantoprazole (PROTONIX) 40 MG tablet, Take 1 tablet (40 mg total) by mouth daily., Disp: 90 tablet, Rfl: 3   sevelamer carbonate (RENVELA) 2.4 g PACK, Take 2.4 g by mouth daily., Disp: , Rfl:    simvastatin (ZOCOR) 20 MG tablet, Take 1 tablet (20 mg total) by mouth every evening., Disp: 90 tablet, Rfl: 3   sitaGLIPtin (JANUVIA) 50 MG tablet, Take 50 mg by mouth every evening. , Disp: , Rfl:    Continuous Blood Gluc Sensor (FREESTYLE LIBRE 14 DAY SENSOR) MISC, APPLY EVERY 14 (FOURTEEN) DAYS., Disp: 2 each, Rfl: 3   No Known Allergies  ROS Review of Systems  Respiratory:  Positive for cough, shortness of breath and wheezing.   Cardiovascular:  Negative for chest pain and leg swelling.  Gastrointestinal:  Negative for abdominal distention, anal bleeding and blood in stool.   Genitourinary:  Negative for flank pain.  Musculoskeletal:  Positive for arthralgias and back pain.  Psychiatric/Behavioral:  Negative for sleep disturbance. The patient is nervous/anxious.      Objective:    Physical Exam Vitals reviewed.  Constitutional:      Appearance: Normal appearance.  HENT:     Mouth/Throat:     Mouth: Mucous membranes are moist.  Eyes:     Pupils: Pupils are equal, round, and reactive to light.  Neck:     Vascular: No carotid bruit.  Cardiovascular:     Rate and Rhythm: Normal rate  and regular rhythm.     Pulses: Normal pulses.     Heart sounds: Normal heart sounds.  Pulmonary:     Effort: Respiratory distress present.     Breath sounds: Wheezing and rhonchi present. No rales.  Abdominal:     General: Bowel sounds are normal.     Palpations: Abdomen is soft. There is no hepatomegaly, splenomegaly or mass.     Hernia: No hernia is present.  Musculoskeletal:     Cervical back: Neck supple.     Right lower leg: No edema.     Left lower leg: No edema.  Skin:    Findings: No rash.  Neurological:     Mental Status: He is alert and oriented to person, place, and time.     Motor: No weakness.  Psychiatric:        Mood and Affect: Mood normal.        Behavior: Behavior normal.    BP (!) 156/65   Pulse 60   Ht 5\' 6"  (1.676 m)   Wt 149 lb 9.6 oz (67.9 kg)   BMI 24.15 kg/m  Wt Readings from Last 3 Encounters:  02/12/21 149 lb 9.6 oz (67.9 kg)  01/21/21 151 lb (68.5 kg)  12/10/20 149 lb 4.8 oz (67.7 kg)     Health Maintenance Due  Topic Date Due   OPHTHALMOLOGY EXAM  Never done   Hepatitis C Screening  Never done   TETANUS/TDAP  Never done   COLONOSCOPY (Pts 45-43yrs Insurance coverage will need to be confirmed)  Never done   FOOT EXAM  03/08/2020   INFLUENZA VACCINE  12/23/2020    There are no preventive care reminders to display for this patient.  Lab Results  Component Value Date   TSH 0.525 09/13/2013   Lab Results  Component  Value Date   WBC 11.8 (H) 05/04/2019   HGB 12.2 (L) 12/10/2020   HCT 36.0 (L) 12/10/2020   MCV 95.1 05/04/2019   PLT 236 05/04/2019   Lab Results  Component Value Date   NA 132 (L) 12/10/2020   K 5.8 (H) 12/10/2020   CO2 26 05/04/2019   GLUCOSE 135 (H) 12/10/2020   BUN 24 (H) 12/10/2020   CREATININE 7.50 (H) 12/10/2020   BILITOT 0.4 09/11/2013   ALKPHOS 134 (H) 09/11/2013   AST 32 09/11/2013   ALT 39 09/11/2013   PROT 8.1 09/11/2013   ALBUMIN 2.8 (L) 09/13/2013   CALCIUM 9.0 05/04/2019   ANIONGAP 15 05/04/2019   Lab Results  Component Value Date   CHOL 144 05/05/2019   Lab Results  Component Value Date   HDL 29 (L) 05/05/2019   Lab Results  Component Value Date   LDLCALC 55 05/05/2019   Lab Results  Component Value Date   TRIG 301 (H) 05/05/2019   Lab Results  Component Value Date   CHOLHDL 5.0 05/05/2019   Lab Results  Component Value Date   HGBA1C 7.0 (A) 02/12/2021      Assessment & Plan:   Problem List Items Addressed This Visit       Endocrine   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    Dispense:  2  each    Refill:  3    Follow-up: No follow-ups on file.    [name], MD

## 2021-02-12 NOTE — Addendum Note (Signed)
Addended by: Lacretia Nicks L on: 02/12/2021 04:00 PM   Modules accepted: Orders

## 2021-02-14 DIAGNOSIS — N2581 Secondary hyperparathyroidism of renal origin: Secondary | ICD-10-CM | POA: Diagnosis not present

## 2021-02-14 DIAGNOSIS — N186 End stage renal disease: Secondary | ICD-10-CM | POA: Diagnosis not present

## 2021-02-14 DIAGNOSIS — Z992 Dependence on renal dialysis: Secondary | ICD-10-CM | POA: Diagnosis not present

## 2021-02-17 DIAGNOSIS — N186 End stage renal disease: Secondary | ICD-10-CM | POA: Diagnosis not present

## 2021-02-17 DIAGNOSIS — Z992 Dependence on renal dialysis: Secondary | ICD-10-CM | POA: Diagnosis not present

## 2021-02-17 DIAGNOSIS — N2581 Secondary hyperparathyroidism of renal origin: Secondary | ICD-10-CM | POA: Diagnosis not present

## 2021-02-19 DIAGNOSIS — Z992 Dependence on renal dialysis: Secondary | ICD-10-CM | POA: Diagnosis not present

## 2021-02-19 DIAGNOSIS — N2581 Secondary hyperparathyroidism of renal origin: Secondary | ICD-10-CM | POA: Diagnosis not present

## 2021-02-19 DIAGNOSIS — N186 End stage renal disease: Secondary | ICD-10-CM | POA: Diagnosis not present

## 2021-02-21 DIAGNOSIS — N2581 Secondary hyperparathyroidism of renal origin: Secondary | ICD-10-CM | POA: Diagnosis not present

## 2021-02-21 DIAGNOSIS — N186 End stage renal disease: Secondary | ICD-10-CM | POA: Diagnosis not present

## 2021-02-21 DIAGNOSIS — E1122 Type 2 diabetes mellitus with diabetic chronic kidney disease: Secondary | ICD-10-CM | POA: Diagnosis not present

## 2021-02-21 DIAGNOSIS — Z992 Dependence on renal dialysis: Secondary | ICD-10-CM | POA: Diagnosis not present

## 2021-02-24 ENCOUNTER — Other Ambulatory Visit: Payer: Self-pay | Admitting: Internal Medicine

## 2021-02-24 DIAGNOSIS — N2581 Secondary hyperparathyroidism of renal origin: Secondary | ICD-10-CM | POA: Diagnosis not present

## 2021-02-24 DIAGNOSIS — N186 End stage renal disease: Secondary | ICD-10-CM | POA: Diagnosis not present

## 2021-02-24 DIAGNOSIS — Z992 Dependence on renal dialysis: Secondary | ICD-10-CM | POA: Diagnosis not present

## 2021-02-26 DIAGNOSIS — Z992 Dependence on renal dialysis: Secondary | ICD-10-CM | POA: Diagnosis not present

## 2021-02-26 DIAGNOSIS — N2581 Secondary hyperparathyroidism of renal origin: Secondary | ICD-10-CM | POA: Diagnosis not present

## 2021-02-26 DIAGNOSIS — N186 End stage renal disease: Secondary | ICD-10-CM | POA: Diagnosis not present

## 2021-02-28 DIAGNOSIS — N2581 Secondary hyperparathyroidism of renal origin: Secondary | ICD-10-CM | POA: Diagnosis not present

## 2021-02-28 DIAGNOSIS — Z992 Dependence on renal dialysis: Secondary | ICD-10-CM | POA: Diagnosis not present

## 2021-02-28 DIAGNOSIS — N186 End stage renal disease: Secondary | ICD-10-CM | POA: Diagnosis not present

## 2021-03-03 ENCOUNTER — Other Ambulatory Visit: Payer: Self-pay | Admitting: Internal Medicine

## 2021-03-03 DIAGNOSIS — Z992 Dependence on renal dialysis: Secondary | ICD-10-CM | POA: Diagnosis not present

## 2021-03-03 DIAGNOSIS — N186 End stage renal disease: Secondary | ICD-10-CM | POA: Diagnosis not present

## 2021-03-03 DIAGNOSIS — N2581 Secondary hyperparathyroidism of renal origin: Secondary | ICD-10-CM | POA: Diagnosis not present

## 2021-03-05 DIAGNOSIS — Z992 Dependence on renal dialysis: Secondary | ICD-10-CM | POA: Diagnosis not present

## 2021-03-05 DIAGNOSIS — N2581 Secondary hyperparathyroidism of renal origin: Secondary | ICD-10-CM | POA: Diagnosis not present

## 2021-03-05 DIAGNOSIS — N186 End stage renal disease: Secondary | ICD-10-CM | POA: Diagnosis not present

## 2021-03-07 DIAGNOSIS — N186 End stage renal disease: Secondary | ICD-10-CM | POA: Diagnosis not present

## 2021-03-07 DIAGNOSIS — N2581 Secondary hyperparathyroidism of renal origin: Secondary | ICD-10-CM | POA: Diagnosis not present

## 2021-03-07 DIAGNOSIS — Z992 Dependence on renal dialysis: Secondary | ICD-10-CM | POA: Diagnosis not present

## 2021-03-10 DIAGNOSIS — Z992 Dependence on renal dialysis: Secondary | ICD-10-CM | POA: Diagnosis not present

## 2021-03-10 DIAGNOSIS — N2581 Secondary hyperparathyroidism of renal origin: Secondary | ICD-10-CM | POA: Diagnosis not present

## 2021-03-10 DIAGNOSIS — N186 End stage renal disease: Secondary | ICD-10-CM | POA: Diagnosis not present

## 2021-03-12 DIAGNOSIS — N186 End stage renal disease: Secondary | ICD-10-CM | POA: Diagnosis not present

## 2021-03-12 DIAGNOSIS — Z992 Dependence on renal dialysis: Secondary | ICD-10-CM | POA: Diagnosis not present

## 2021-03-12 DIAGNOSIS — N2581 Secondary hyperparathyroidism of renal origin: Secondary | ICD-10-CM | POA: Diagnosis not present

## 2021-03-14 DIAGNOSIS — Z992 Dependence on renal dialysis: Secondary | ICD-10-CM | POA: Diagnosis not present

## 2021-03-14 DIAGNOSIS — N186 End stage renal disease: Secondary | ICD-10-CM | POA: Diagnosis not present

## 2021-03-14 DIAGNOSIS — N2581 Secondary hyperparathyroidism of renal origin: Secondary | ICD-10-CM | POA: Diagnosis not present

## 2021-03-17 DIAGNOSIS — N2581 Secondary hyperparathyroidism of renal origin: Secondary | ICD-10-CM | POA: Diagnosis not present

## 2021-03-17 DIAGNOSIS — Z992 Dependence on renal dialysis: Secondary | ICD-10-CM | POA: Diagnosis not present

## 2021-03-17 DIAGNOSIS — N186 End stage renal disease: Secondary | ICD-10-CM | POA: Diagnosis not present

## 2021-03-19 DIAGNOSIS — Z992 Dependence on renal dialysis: Secondary | ICD-10-CM | POA: Diagnosis not present

## 2021-03-19 DIAGNOSIS — N2581 Secondary hyperparathyroidism of renal origin: Secondary | ICD-10-CM | POA: Diagnosis not present

## 2021-03-19 DIAGNOSIS — N186 End stage renal disease: Secondary | ICD-10-CM | POA: Diagnosis not present

## 2021-03-21 DIAGNOSIS — N2581 Secondary hyperparathyroidism of renal origin: Secondary | ICD-10-CM | POA: Diagnosis not present

## 2021-03-21 DIAGNOSIS — Z992 Dependence on renal dialysis: Secondary | ICD-10-CM | POA: Diagnosis not present

## 2021-03-21 DIAGNOSIS — N186 End stage renal disease: Secondary | ICD-10-CM | POA: Diagnosis not present

## 2021-03-24 ENCOUNTER — Other Ambulatory Visit: Payer: Self-pay | Admitting: Internal Medicine

## 2021-03-24 DIAGNOSIS — N186 End stage renal disease: Secondary | ICD-10-CM | POA: Diagnosis not present

## 2021-03-24 DIAGNOSIS — N2581 Secondary hyperparathyroidism of renal origin: Secondary | ICD-10-CM | POA: Diagnosis not present

## 2021-03-24 DIAGNOSIS — E1122 Type 2 diabetes mellitus with diabetic chronic kidney disease: Secondary | ICD-10-CM | POA: Diagnosis not present

## 2021-03-24 DIAGNOSIS — Z992 Dependence on renal dialysis: Secondary | ICD-10-CM | POA: Diagnosis not present

## 2021-03-26 DIAGNOSIS — N186 End stage renal disease: Secondary | ICD-10-CM | POA: Diagnosis not present

## 2021-03-26 DIAGNOSIS — Z992 Dependence on renal dialysis: Secondary | ICD-10-CM | POA: Diagnosis not present

## 2021-03-26 DIAGNOSIS — N2581 Secondary hyperparathyroidism of renal origin: Secondary | ICD-10-CM | POA: Diagnosis not present

## 2021-03-28 DIAGNOSIS — N2581 Secondary hyperparathyroidism of renal origin: Secondary | ICD-10-CM | POA: Diagnosis not present

## 2021-03-28 DIAGNOSIS — Z992 Dependence on renal dialysis: Secondary | ICD-10-CM | POA: Diagnosis not present

## 2021-03-28 DIAGNOSIS — N186 End stage renal disease: Secondary | ICD-10-CM | POA: Diagnosis not present

## 2021-03-31 DIAGNOSIS — N2581 Secondary hyperparathyroidism of renal origin: Secondary | ICD-10-CM | POA: Diagnosis not present

## 2021-03-31 DIAGNOSIS — Z992 Dependence on renal dialysis: Secondary | ICD-10-CM | POA: Diagnosis not present

## 2021-03-31 DIAGNOSIS — N186 End stage renal disease: Secondary | ICD-10-CM | POA: Diagnosis not present

## 2021-04-02 DIAGNOSIS — N186 End stage renal disease: Secondary | ICD-10-CM | POA: Diagnosis not present

## 2021-04-02 DIAGNOSIS — Z992 Dependence on renal dialysis: Secondary | ICD-10-CM | POA: Diagnosis not present

## 2021-04-02 DIAGNOSIS — N2581 Secondary hyperparathyroidism of renal origin: Secondary | ICD-10-CM | POA: Diagnosis not present

## 2021-04-04 DIAGNOSIS — N186 End stage renal disease: Secondary | ICD-10-CM | POA: Diagnosis not present

## 2021-04-04 DIAGNOSIS — N2581 Secondary hyperparathyroidism of renal origin: Secondary | ICD-10-CM | POA: Diagnosis not present

## 2021-04-04 DIAGNOSIS — Z992 Dependence on renal dialysis: Secondary | ICD-10-CM | POA: Diagnosis not present

## 2021-04-07 DIAGNOSIS — N186 End stage renal disease: Secondary | ICD-10-CM | POA: Diagnosis not present

## 2021-04-07 DIAGNOSIS — N2581 Secondary hyperparathyroidism of renal origin: Secondary | ICD-10-CM | POA: Diagnosis not present

## 2021-04-07 DIAGNOSIS — Z992 Dependence on renal dialysis: Secondary | ICD-10-CM | POA: Diagnosis not present

## 2021-04-09 DIAGNOSIS — N186 End stage renal disease: Secondary | ICD-10-CM | POA: Diagnosis not present

## 2021-04-09 DIAGNOSIS — Z992 Dependence on renal dialysis: Secondary | ICD-10-CM | POA: Diagnosis not present

## 2021-04-09 DIAGNOSIS — N2581 Secondary hyperparathyroidism of renal origin: Secondary | ICD-10-CM | POA: Diagnosis not present

## 2021-04-11 DIAGNOSIS — Z992 Dependence on renal dialysis: Secondary | ICD-10-CM | POA: Diagnosis not present

## 2021-04-11 DIAGNOSIS — N2581 Secondary hyperparathyroidism of renal origin: Secondary | ICD-10-CM | POA: Diagnosis not present

## 2021-04-11 DIAGNOSIS — N186 End stage renal disease: Secondary | ICD-10-CM | POA: Diagnosis not present

## 2021-04-14 DIAGNOSIS — N2581 Secondary hyperparathyroidism of renal origin: Secondary | ICD-10-CM | POA: Diagnosis not present

## 2021-04-14 DIAGNOSIS — Z992 Dependence on renal dialysis: Secondary | ICD-10-CM | POA: Diagnosis not present

## 2021-04-14 DIAGNOSIS — N186 End stage renal disease: Secondary | ICD-10-CM | POA: Diagnosis not present

## 2021-04-16 DIAGNOSIS — N2581 Secondary hyperparathyroidism of renal origin: Secondary | ICD-10-CM | POA: Diagnosis not present

## 2021-04-16 DIAGNOSIS — N186 End stage renal disease: Secondary | ICD-10-CM | POA: Diagnosis not present

## 2021-04-16 DIAGNOSIS — Z992 Dependence on renal dialysis: Secondary | ICD-10-CM | POA: Diagnosis not present

## 2021-04-19 DIAGNOSIS — N186 End stage renal disease: Secondary | ICD-10-CM | POA: Diagnosis not present

## 2021-04-19 DIAGNOSIS — N2581 Secondary hyperparathyroidism of renal origin: Secondary | ICD-10-CM | POA: Diagnosis not present

## 2021-04-19 DIAGNOSIS — Z992 Dependence on renal dialysis: Secondary | ICD-10-CM | POA: Diagnosis not present

## 2021-04-21 DIAGNOSIS — N2581 Secondary hyperparathyroidism of renal origin: Secondary | ICD-10-CM | POA: Diagnosis not present

## 2021-04-21 DIAGNOSIS — Z992 Dependence on renal dialysis: Secondary | ICD-10-CM | POA: Diagnosis not present

## 2021-04-21 DIAGNOSIS — N186 End stage renal disease: Secondary | ICD-10-CM | POA: Diagnosis not present

## 2021-04-22 ENCOUNTER — Other Ambulatory Visit: Payer: Self-pay | Admitting: Internal Medicine

## 2021-04-23 DIAGNOSIS — N2581 Secondary hyperparathyroidism of renal origin: Secondary | ICD-10-CM | POA: Diagnosis not present

## 2021-04-23 DIAGNOSIS — N186 End stage renal disease: Secondary | ICD-10-CM | POA: Diagnosis not present

## 2021-04-23 DIAGNOSIS — E1122 Type 2 diabetes mellitus with diabetic chronic kidney disease: Secondary | ICD-10-CM | POA: Diagnosis not present

## 2021-04-23 DIAGNOSIS — Z992 Dependence on renal dialysis: Secondary | ICD-10-CM | POA: Diagnosis not present

## 2021-04-25 DIAGNOSIS — N2581 Secondary hyperparathyroidism of renal origin: Secondary | ICD-10-CM | POA: Diagnosis not present

## 2021-04-25 DIAGNOSIS — N186 End stage renal disease: Secondary | ICD-10-CM | POA: Diagnosis not present

## 2021-04-25 DIAGNOSIS — Z992 Dependence on renal dialysis: Secondary | ICD-10-CM | POA: Diagnosis not present

## 2021-04-28 DIAGNOSIS — Z992 Dependence on renal dialysis: Secondary | ICD-10-CM | POA: Diagnosis not present

## 2021-04-28 DIAGNOSIS — N186 End stage renal disease: Secondary | ICD-10-CM | POA: Diagnosis not present

## 2021-04-28 DIAGNOSIS — N2581 Secondary hyperparathyroidism of renal origin: Secondary | ICD-10-CM | POA: Diagnosis not present

## 2021-04-29 ENCOUNTER — Encounter: Payer: Self-pay | Admitting: Internal Medicine

## 2021-04-29 ENCOUNTER — Other Ambulatory Visit: Payer: Self-pay

## 2021-04-29 ENCOUNTER — Ambulatory Visit (INDEPENDENT_AMBULATORY_CARE_PROVIDER_SITE_OTHER): Payer: Medicare HMO | Admitting: Internal Medicine

## 2021-04-29 VITALS — BP 133/86 | HR 87 | Ht 66.0 in | Wt 152.5 lb

## 2021-04-29 DIAGNOSIS — E039 Hypothyroidism, unspecified: Secondary | ICD-10-CM | POA: Diagnosis not present

## 2021-04-29 DIAGNOSIS — Z794 Long term (current) use of insulin: Secondary | ICD-10-CM | POA: Diagnosis not present

## 2021-04-29 DIAGNOSIS — I10 Essential (primary) hypertension: Secondary | ICD-10-CM

## 2021-04-29 DIAGNOSIS — E1140 Type 2 diabetes mellitus with diabetic neuropathy, unspecified: Secondary | ICD-10-CM

## 2021-04-29 DIAGNOSIS — K2100 Gastro-esophageal reflux disease with esophagitis, without bleeding: Secondary | ICD-10-CM | POA: Diagnosis not present

## 2021-04-29 DIAGNOSIS — E119 Type 2 diabetes mellitus without complications: Secondary | ICD-10-CM

## 2021-04-29 LAB — GLUCOSE, POCT (MANUAL RESULT ENTRY): POC Glucose: 151 mg/dl — AB (ref 70–99)

## 2021-04-29 NOTE — Progress Notes (Signed)
Established Patient Office Visit  Subjective:  Patient ID: Joseph Mcpherson, male    DOB: [birth year]  Age: 75 y.o. MRN: [id number]  CC:  Chief Complaint  Patient presents with   Follow-up    HPI  Joseph Mcpherson presents for check up  Past Medical History:  Diagnosis Date   Clotting disorder [facility])    per daughter, "when patient travels, his feet swell"   Diabetes mellitus without complication ([place])    type II   Diabetic eye exam ([place]) 12/2019   ESRD (end stage renal disease) ([place])    MWF- [place]   GERD (gastroesophageal reflux disease)    [name] (hard of hearing)    Hyperlipidemia    Hypertension    Hypothyroidism    Thyroid disease     Past Surgical History:  Procedure Laterality Date   AV FISTULA PLACEMENT Right 10/22/2017   Procedure: ARTERIOVENOUS (AV) BRACHIOCEPHALIC FISTULA CREATION RIGHT UPPER ARM;  Surgeon: Conrad [place], MD;  Location: [place];  Service: Vascular;  Laterality: Right;   AV FISTULA PLACEMENT Left 02/03/2019   Procedure: INSERTION OF ARTERIOVENOUS (AV) GORE-TEX GRAFT ARM (BRACHIAL AXILLARY );  Surgeon: [name], MD;  Location: [facility];  Service: Vascular;  Laterality: Left;   AV FISTULA PLACEMENT Left 12/10/2020   Procedure: LEFT ARM BRACHIOCEPHALIC ARTERIOVENOUS (AV) FISTULA CREATION;  Surgeon: [name], MD;  Location: [place];  Service: Vascular;  Laterality: Left;   UPPER EXTREMITY ANGIOGRAPHY Left 09/19/2019   Procedure: UPPER EXTREMITY ANGIOGRAPHY;  Surgeon: [name], MD;  Location: [place] [facility];  Service: Cardiovascular;  Laterality: Left;    Family History  Problem Relation Age of Onset   Diabetes Mother    Hyperlipidemia Mother    Hypertension Father     Social History   Socioeconomic History   Marital status: Married    Spouse name: Not on file   Number of children: Not on file   Years of education: Not on file   Highest education level: Not on file  Occupational History   Not on file  Tobacco Use    Smoking status: Never   Smokeless tobacco: Former    Types: Snuff  Vaping Use   Vaping Use: Never used  Substance and Sexual Activity   Alcohol use: No   Drug use: No   Sexual activity: Not Currently  Other Topics Concern   Not on file  Social History Narrative   Not on file   Social Determinants of Health   Financial Resource Strain: Not on file  Food Insecurity: Not on file  Transportation Needs: Not on file  Physical Activity: Not on file  Stress: Not on file  Social Connections: Not on file  Intimate Partner Violence: Not on file     Current Outpatient Medications:    albuterol (PROVENTIL) (2.5 MG/3ML) 0.083% nebulizer solution, Take 3 mLs (2.5 mg total) by nebulization every 6 (six) hours as needed for wheezing or shortness of breath., Disp: 150 mL, Rfl: 1   aspirin EC 325 MG EC tablet, Take 1 tablet (325 mg total) by mouth daily., Disp: 30 tablet, Rfl: 3   aspirin EC 81 MG tablet, Take 81 mg by mouth daily. Swallow whole., Disp: , Rfl:    B Complex-C-Folic Acid (RENA-VITE RX) 1 MG TABS, Take 1 tablet by mouth daily. , Disp: , Rfl:    BD PEN NEEDLE NANO 2ND GEN 32G X 4 MM MISC, USE AS DIRECTED, Disp: 300 each, Rfl: 3  calcium acetate (PHOSLO) 667 MG capsule, TAKE 2 CAPSULES BY MOUTH 3 TIMES A DAY WITH MEAL (Patient taking differently: Take 1,334 mg by mouth 2 (two) times daily with a meal.), Disp: 180 capsule, Rfl: 4   Cholecalciferol (VITAMIN D3) 5000 units TABS, Take 5,000 Units by mouth daily. , Disp: , Rfl:    Colchicine (MITIGARE) 0.6 MG CAPS, Take 0.6 tablets by mouth daily at 2 PM., Disp: 30 capsule, Rfl: 4   colchicine 0.6 MG tablet, Take 1 tablet (0.6 mg total) by mouth daily., Disp: 20 tablet, Rfl: 1   Continuous Blood Gluc Sensor (FREESTYLE LIBRE 14 DAY SENSOR) MISC, APPLY EVERY 14 (FOURTEEN) DAYS., Disp: 2 each, Rfl: 3   docusate sodium (COLACE) 100 MG capsule, TAKE 1 CAPSULE BY MOUTH TWICE A DAY, Disp: 60 capsule, Rfl: 1   donepezil (ARICEPT) 10 MG tablet,  TAKE 1 TABLET BY MOUTH EVERY DAY, Disp: 90 tablet, Rfl: 3   gabapentin (NEURONTIN) 300 MG capsule, TAKE 1 CAPSULE BY MOUTH EVERY DAY AT BEDTIME AS DIRECTED (Patient taking differently: Take 300 mg by mouth at bedtime.), Disp: 90 capsule, Rfl: 3   hydrALAZINE (APRESOLINE) 50 MG tablet, Take 1 tablet (50 mg total) by mouth 3 (three) times daily. (Patient taking differently: Take 25 mg by mouth in the morning and at bedtime.), Disp: 90 tablet, Rfl: 6   HYDROcodone-acetaminophen (NORCO) 5-325 MG tablet, Take 1 tablet by mouth every 6 (six) hours as needed for moderate pain., Disp: 10 tablet, Rfl: 0   insulin aspart (NOVOLOG) 100 UNIT/ML FlexPen, Inject 10 Units into the skin at bedtime., Disp: , Rfl:    insulin detemir (LEVEMIR) 100 UNIT/ML injection, Inject 20 Units into the skin at bedtime., Disp: , Rfl:    levocetirizine (XYZAL) 5 MG tablet, TAKE 1 TABLET BY MOUTH EVERY DAY IN THE EVENING, Disp: 90 tablet, Rfl: 1   levothyroxine (SYNTHROID) 137 MCG tablet, TAKE 1 TABLET BY MOUTH EVERY DAY, Disp: 90 tablet, Rfl: 3   losartan (COZAAR) 25 MG tablet, TAKE 1 TABLET BY MOUTH EVERY EVENING, Disp: 90 tablet, Rfl: 3   metoprolol succinate (TOPROL-XL) 50 MG 24 hr tablet, TAKE 1 TABLET BY MOUTH EVERY DAY, Disp: 90 tablet, Rfl: 3   Multiple Vitamins-Minerals (MULTIVITAMIN WITH MINERALS) tablet, Take 1 tablet by mouth daily., Disp: , Rfl:    omeprazole (PRILOSEC) 40 MG capsule, TAKE 1 CAPSULE BY MOUTH EVERY DAY, Disp: 90 capsule, Rfl: 2   pantoprazole (PROTONIX) 40 MG tablet, Take 1 tablet (40 mg total) by mouth daily., Disp: 90 tablet, Rfl: 3   sevelamer carbonate (RENVELA) 2.4 g PACK, Take 2.4 g by mouth daily., Disp: , Rfl:    simvastatin (ZOCOR) 20 MG tablet, TAKE 1 TABLET BY MOUTH EVERY DAY IN THE EVENING, Disp: 90 tablet, Rfl: 3   sitaGLIPtin (JANUVIA) 50 MG tablet, Take 50 mg by mouth every evening. , Disp: , Rfl:    No Known Allergies  ROS Review of Systems  Constitutional: Negative.   HENT:  Negative.    Eyes: Negative.   Respiratory: Negative.    Cardiovascular: Negative.   Gastrointestinal: Negative.   Endocrine: Negative.   Genitourinary: Negative.   Musculoskeletal: Negative.   Skin: Negative.   Allergic/Immunologic: Negative.   Neurological: Negative.   Hematological: Negative.   Psychiatric/Behavioral: Negative.    All other systems reviewed and are negative.    Objective:    Physical Exam Vitals reviewed.  Constitutional:      Appearance: Normal appearance.  HENT:  Mouth/Throat:     Mouth: Mucous membranes are moist.  Eyes:     Pupils: Pupils are equal, round, and reactive to light.  Neck:     Vascular: No carotid bruit.  Cardiovascular:     Rate and Rhythm: Normal rate and regular rhythm.     Pulses: Normal pulses.     Heart sounds: Normal heart sounds.  Pulmonary:     Effort: Pulmonary effort is normal.     Breath sounds: Normal breath sounds.  Abdominal:     General: Bowel sounds are normal.     Palpations: Abdomen is soft. There is no hepatomegaly, splenomegaly or mass.     Tenderness: There is no abdominal tenderness.     Hernia: No hernia is present.  Musculoskeletal:     Cervical back: Neck supple.     Right lower leg: No edema.     Left lower leg: No edema.  Skin:    Findings: No rash.  Neurological:     Mental Status: He is alert and oriented to person, place, and time.     Motor: No weakness.  Psychiatric:        Mood and Affect: Mood normal.        Behavior: Behavior normal.    BP 133/86   Pulse 87   Ht 5\' 6"  (1.676 m)   Wt 152 lb 8 oz (69.2 kg)   BMI 24.61 kg/m  Wt Readings from Last 3 Encounters:  04/29/21 152 lb 8 oz (69.2 kg)  02/12/21 149 lb 9.6 oz (67.9 kg)  01/21/21 151 lb (68.5 kg)     Health Maintenance Due  Topic Date Due   OPHTHALMOLOGY EXAM  Never done   Hepatitis C Screening  Never done   TETANUS/TDAP  Never done   COLONOSCOPY (Pts 45-79yrs Insurance coverage will need to be confirmed)  Never done    FOOT EXAM  03/08/2020   COVID-19 Vaccine (5 - Booster for Pfizer series) 11/21/2020   INFLUENZA VACCINE  12/23/2020    There are no preventive care reminders to display for this patient.  Lab Results  Component Value Date   TSH 0.525 09/13/2013   Lab Results  Component Value Date   WBC 11.8 (H) 05/04/2019   HGB 12.2 (L) 12/10/2020   HCT 36.0 (L) 12/10/2020   MCV 95.1 05/04/2019   PLT 236 05/04/2019   Lab Results  Component Value Date   NA 132 (L) 12/10/2020   K 5.8 (H) 12/10/2020   CO2 26 05/04/2019   GLUCOSE 135 (H) 12/10/2020   BUN 24 (H) 12/10/2020   CREATININE 7.50 (H) 12/10/2020   BILITOT 0.4 09/11/2013   ALKPHOS 134 (H) 09/11/2013   AST 32 09/11/2013   ALT 39 09/11/2013   PROT 8.1 09/11/2013   ALBUMIN 2.8 (L) 09/13/2013   CALCIUM 9.0 05/04/2019   ANIONGAP 15 05/04/2019   Lab Results  Component Value Date   CHOL 144 05/05/2019   Lab Results  Component Value Date   HDL 29 (L) 05/05/2019   Lab Results  Component Value Date   LDLCALC 55 05/05/2019   Lab Results  Component Value Date   TRIG 301 (H) 05/05/2019   Lab Results  Component Value Date   CHOLHDL 5.0 05/05/2019   Lab Results  Component Value Date   HGBA1C 7.0 (A) 02/12/2021      Assessment & Plan:   Problem List Items Addressed This Visit       Cardiovascular and Mediastinum  Essential hypertension     Patient denies any chest pain or shortness of breath there is no history of palpitation or paroxysmal nocturnal dyspnea   patient was advised to follow low-salt low-cholesterol diet    ideally I want to keep systolic blood pressure below 130 mmHg, patient was asked to check blood pressure one times a week and give me a report on that.  Patient will be follow-up in 3 months  or earlier as needed, patient will call me back for any change in the cardiovascular symptoms Patient was advised to buy a book from local bookstore concerning blood pressure and read several chapters  every day.   This will be supplemented by some of the material we will give him from the office.  Patient should also utilize other resources like YouTube and Internet to learn more about the blood pressure and the diet.        Digestive   Gastroesophageal reflux disease with esophagitis    - The patient's GERD is stable on medication.  - Instructed the patient to avoid eating spicy and acidic foods, as well as foods high in fat. - Instructed the patient to avoid eating large meals or meals 2-3 hours prior to sleeping.        Endocrine   Hypothyroidism (acquired)    Stable at the present time      Type 2 diabetes mellitus with diabetic neuropathy, unspecified ([place])    - The patient's blood sugar is labile on med. - The patient will continue the current treatment regimen.  - I encouraged the patient to regularly check blood sugar.  - I encouraged the patient to monitor diet. I encouraged the patient to eat low-carb and low-sugar to help prevent blood sugar spikes.  - I encouraged the patient to continue following their prescribed treatment plan for diabetes - I informed the patient to get help if blood sugar drops below 54mg /dL, or if suddenly have trouble thinking clearly or breathing.  Patient was advised to buy a book on diabetes from a local bookstore or from [place]  Patient should read 2 chapters every day to keep the motivation going, this is in addition to some of the materials we provided them from the office.  There are other resources on the Internet like YouTube and [employer]kip[employer] to get an education on the diabetes      Other Visit Diagnoses     Type 2 diabetes mellitus without complication, without long-term current use of insulin ([place])    -  Primary   Relevant Orders   POCT glucose (manual entry) (Completed)       No orders of the defined types were placed in this encounter.   Follow-up: No follow-ups on file.    [name], MD

## 2021-04-29 NOTE — Assessment & Plan Note (Signed)
-   The patient's GERD is stable on medication.  - Instructed the patient to avoid eating spicy and acidic foods, as well as foods high in fat. - Instructed the patient to avoid eating large meals or meals 2-3 hours prior to sleeping. 

## 2021-04-29 NOTE — Assessment & Plan Note (Signed)

## 2021-04-29 NOTE — Assessment & Plan Note (Signed)

## 2021-04-29 NOTE — Assessment & Plan Note (Signed)
Stable at the present time. 

## 2021-04-30 DIAGNOSIS — N186 End stage renal disease: Secondary | ICD-10-CM | POA: Diagnosis not present

## 2021-04-30 DIAGNOSIS — Z992 Dependence on renal dialysis: Secondary | ICD-10-CM | POA: Diagnosis not present

## 2021-04-30 DIAGNOSIS — N2581 Secondary hyperparathyroidism of renal origin: Secondary | ICD-10-CM | POA: Diagnosis not present

## 2021-05-02 DIAGNOSIS — N186 End stage renal disease: Secondary | ICD-10-CM | POA: Diagnosis not present

## 2021-05-02 DIAGNOSIS — N2581 Secondary hyperparathyroidism of renal origin: Secondary | ICD-10-CM | POA: Diagnosis not present

## 2021-05-02 DIAGNOSIS — Z992 Dependence on renal dialysis: Secondary | ICD-10-CM | POA: Diagnosis not present

## 2021-05-15 ENCOUNTER — Other Ambulatory Visit: Payer: Self-pay | Admitting: Internal Medicine

## 2021-06-09 DIAGNOSIS — N186 End stage renal disease: Secondary | ICD-10-CM | POA: Diagnosis not present

## 2021-06-09 DIAGNOSIS — Z992 Dependence on renal dialysis: Secondary | ICD-10-CM | POA: Diagnosis not present

## 2021-06-09 DIAGNOSIS — N2581 Secondary hyperparathyroidism of renal origin: Secondary | ICD-10-CM | POA: Diagnosis not present

## 2021-06-11 DIAGNOSIS — N2581 Secondary hyperparathyroidism of renal origin: Secondary | ICD-10-CM | POA: Diagnosis not present

## 2021-06-11 DIAGNOSIS — N186 End stage renal disease: Secondary | ICD-10-CM | POA: Diagnosis not present

## 2021-06-11 DIAGNOSIS — Z992 Dependence on renal dialysis: Secondary | ICD-10-CM | POA: Diagnosis not present

## 2021-06-13 DIAGNOSIS — Z992 Dependence on renal dialysis: Secondary | ICD-10-CM | POA: Diagnosis not present

## 2021-06-13 DIAGNOSIS — N2581 Secondary hyperparathyroidism of renal origin: Secondary | ICD-10-CM | POA: Diagnosis not present

## 2021-06-13 DIAGNOSIS — N186 End stage renal disease: Secondary | ICD-10-CM | POA: Diagnosis not present

## 2021-06-16 ENCOUNTER — Ambulatory Visit (INDEPENDENT_AMBULATORY_CARE_PROVIDER_SITE_OTHER): Payer: Medicare HMO | Admitting: Internal Medicine

## 2021-06-16 ENCOUNTER — Other Ambulatory Visit: Payer: Self-pay

## 2021-06-16 ENCOUNTER — Encounter: Payer: Self-pay | Admitting: Internal Medicine

## 2021-06-16 VITALS — BP 158/80 | HR 70 | Ht 66.0 in | Wt 149.9 lb

## 2021-06-16 DIAGNOSIS — N2581 Secondary hyperparathyroidism of renal origin: Secondary | ICD-10-CM | POA: Diagnosis not present

## 2021-06-16 DIAGNOSIS — E782 Mixed hyperlipidemia: Secondary | ICD-10-CM

## 2021-06-16 DIAGNOSIS — R4189 Other symptoms and signs involving cognitive functions and awareness: Secondary | ICD-10-CM

## 2021-06-16 DIAGNOSIS — E039 Hypothyroidism, unspecified: Secondary | ICD-10-CM

## 2021-06-16 DIAGNOSIS — J209 Acute bronchitis, unspecified: Secondary | ICD-10-CM | POA: Diagnosis not present

## 2021-06-16 DIAGNOSIS — R29818 Other symptoms and signs involving the nervous system: Secondary | ICD-10-CM | POA: Diagnosis not present

## 2021-06-16 DIAGNOSIS — E1140 Type 2 diabetes mellitus with diabetic neuropathy, unspecified: Secondary | ICD-10-CM | POA: Diagnosis not present

## 2021-06-16 DIAGNOSIS — J219 Acute bronchiolitis, unspecified: Secondary | ICD-10-CM | POA: Diagnosis not present

## 2021-06-16 DIAGNOSIS — Z794 Long term (current) use of insulin: Secondary | ICD-10-CM

## 2021-06-16 DIAGNOSIS — N186 End stage renal disease: Secondary | ICD-10-CM | POA: Diagnosis not present

## 2021-06-16 DIAGNOSIS — Z992 Dependence on renal dialysis: Secondary | ICD-10-CM | POA: Diagnosis not present

## 2021-06-16 MED ORDER — AZITHROMYCIN 250 MG PO TABS: ORAL_TABLET | ORAL | 0 refills | Status: AC

## 2021-06-16 NOTE — Assessment & Plan Note (Signed)
Stable at the present time. 

## 2021-06-16 NOTE — Assessment & Plan Note (Signed)

## 2021-06-16 NOTE — Progress Notes (Signed)
Established Patient Office Visit  Subjective:  Patient ID: Joseph Mcpherson, male    DOB: 11/12/1945  Age: 76 y.o. MRN: [id number]  CC:  Chief Complaint  Patient presents with   Sinusitis    Patient complains of coughing started 3 days ago. No other symptoms, no congestion, fever or sore throat.    Sinusitis Associated symptoms include congestion, coughing and a sore throat. Pertinent negatives include no chills.   Joseph Mcpherson presents for uri  Past Medical History:  Diagnosis Date   Clotting disorder [facility])    per daughter, "when patient travels, his feet swell"   Diabetes mellitus without complication ([place])    type II   Diabetic eye exam ([place]) 12/2019   ESRD (end stage renal disease) ([place])    MWF- Cut Off   GERD (gastroesophageal reflux disease)    [name] (hard of hearing)    Hyperlipidemia    Hypertension    Hypothyroidism    Thyroid disease     Past Surgical History:  Procedure Laterality Date   AV FISTULA PLACEMENT Right 10/22/2017   Procedure: ARTERIOVENOUS (AV) BRACHIOCEPHALIC FISTULA CREATION RIGHT UPPER ARM;  Surgeon: Conrad [place], MD;  Location: [place];  Service: Vascular;  Laterality: Right;   AV FISTULA PLACEMENT Left 02/03/2019   Procedure: INSERTION OF ARTERIOVENOUS (AV) GORE-TEX GRAFT ARM (BRACHIAL AXILLARY );  Surgeon: [name], MD;  Location: [facility];  Service: Vascular;  Laterality: Left;   AV FISTULA PLACEMENT Left 12/10/2020   Procedure: LEFT ARM BRACHIOCEPHALIC ARTERIOVENOUS (AV) FISTULA CREATION;  Surgeon: [name], MD;  Location: [place];  Service: Vascular;  Laterality: Left;   UPPER EXTREMITY ANGIOGRAPHY Left 09/19/2019   Procedure: UPPER EXTREMITY ANGIOGRAPHY;  Surgeon: [name], MD;  Location: [place] [facility];  Service: Cardiovascular;  Laterality: Left;    Family History  Problem Relation Age of Onset   Diabetes Mother    Hyperlipidemia Mother    Hypertension Father     Social History   Socioeconomic History    Marital status: Married    Spouse name: Not on file   Number of children: Not on file   Years of education: Not on file   Highest education level: Not on file  Occupational History   Not on file  Tobacco Use   Smoking status: Never   Smokeless tobacco: Former    Types: Snuff  Vaping Use   Vaping Use: Never used  Substance and Sexual Activity   Alcohol use: No   Drug use: No   Sexual activity: Not Currently  Other Topics Concern   Not on file  Social History Narrative   Not on file   Social Determinants of Health   Financial Resource Strain: Not on file  Food Insecurity: Not on file  Transportation Needs: Not on file  Physical Activity: Not on file  Stress: Not on file  Social Connections: Not on file  Intimate Partner Violence: Not on file     Current Outpatient Medications:    albuterol (PROVENTIL) (2.5 MG/3ML) 0.083% nebulizer solution, Take 3 mLs (2.5 mg total) by nebulization every 6 (six) hours as needed for wheezing or shortness of breath., Disp: 150 mL, Rfl: 1   aspirin EC 325 MG EC tablet, Take 1 tablet (325 mg total) by mouth daily., Disp: 30 tablet, Rfl: 3   aspirin EC 81 MG tablet, Take 81 mg by mouth daily. Swallow whole., Disp: , Rfl:    B Complex-C-Folic Acid (RENA-VITE RX) 1 MG  TABS, Take 1 tablet by mouth daily. , Disp: , Rfl:    BD PEN NEEDLE NANO 2ND GEN 32G X 4 MM MISC, USE AS DIRECTED, Disp: 300 each, Rfl: 3   calcium acetate (PHOSLO) 667 MG capsule, TAKE 2 CAPSULES BY MOUTH 3 TIMES A DAY WITH MEAL, Disp: 180 capsule, Rfl: 4   Cholecalciferol (VITAMIN D3) 5000 units TABS, Take 5,000 Units by mouth daily. , Disp: , Rfl:    Colchicine (MITIGARE) 0.6 MG CAPS, Take 0.6 tablets by mouth daily at 2 PM., Disp: 30 capsule, Rfl: 4   colchicine 0.6 MG tablet, Take 1 tablet (0.6 mg total) by mouth daily., Disp: 20 tablet, Rfl: 1   Continuous Blood Gluc Sensor (FREESTYLE LIBRE 14 DAY SENSOR) MISC, APPLY EVERY 14 (FOURTEEN) DAYS., Disp: 2 each, Rfl: 3   docusate  sodium (COLACE) 100 MG capsule, TAKE 1 CAPSULE BY MOUTH TWICE A DAY, Disp: 60 capsule, Rfl: 1   donepezil (ARICEPT) 10 MG tablet, TAKE 1 TABLET BY MOUTH EVERY DAY, Disp: 90 tablet, Rfl: 3   gabapentin (NEURONTIN) 300 MG capsule, TAKE 1 CAPSULE BY MOUTH EVERY DAY AT BEDTIME AS DIRECTED (Patient taking differently: Take 300 mg by mouth at bedtime.), Disp: 90 capsule, Rfl: 3   hydrALAZINE (APRESOLINE) 50 MG tablet, Take 1 tablet (50 mg total) by mouth 3 (three) times daily. (Patient taking differently: Take 25 mg by mouth in the morning and at bedtime.), Disp: 90 tablet, Rfl: 6   HYDROcodone-acetaminophen (NORCO) 5-325 MG tablet, Take 1 tablet by mouth every 6 (six) hours as needed for moderate pain., Disp: 10 tablet, Rfl: 0   insulin aspart (NOVOLOG) 100 UNIT/ML FlexPen, Inject 10 Units into the skin at bedtime., Disp: , Rfl:    insulin detemir (LEVEMIR) 100 UNIT/ML injection, Inject 20 Units into the skin at bedtime., Disp: , Rfl:    levocetirizine (XYZAL) 5 MG tablet, TAKE 1 TABLET BY MOUTH EVERY DAY IN THE EVENING, Disp: 90 tablet, Rfl: 1   levothyroxine (SYNTHROID) 137 MCG tablet, TAKE 1 TABLET BY MOUTH EVERY DAY, Disp: 90 tablet, Rfl: 3   losartan (COZAAR) 25 MG tablet, TAKE 1 TABLET BY MOUTH EVERY EVENING, Disp: 90 tablet, Rfl: 3   metoprolol succinate (TOPROL-XL) 50 MG 24 hr tablet, TAKE 1 TABLET BY MOUTH EVERY DAY, Disp: 90 tablet, Rfl: 3   Multiple Vitamins-Minerals (MULTIVITAMIN WITH MINERALS) tablet, Take 1 tablet by mouth daily., Disp: , Rfl:    omeprazole (PRILOSEC) 40 MG capsule, TAKE 1 CAPSULE BY MOUTH EVERY DAY, Disp: 90 capsule, Rfl: 2   pantoprazole (PROTONIX) 40 MG tablet, Take 1 tablet (40 mg total) by mouth daily., Disp: 90 tablet, Rfl: 3   sevelamer carbonate (RENVELA) 2.4 g PACK, Take 2.4 g by mouth daily., Disp: , Rfl:    simvastatin (ZOCOR) 20 MG tablet, TAKE 1 TABLET BY MOUTH EVERY DAY IN THE EVENING, Disp: 90 tablet, Rfl: 3   sitaGLIPtin (JANUVIA) 50 MG tablet, Take 50 mg by  mouth every evening. , Disp: , Rfl:    No Known Allergies  ROS Review of Systems  Constitutional:  Positive for appetite change and fatigue. Negative for chills.  HENT:  Positive for congestion and sore throat. Negative for nosebleeds.   Eyes: Negative.   Respiratory:  Positive for cough.   Cardiovascular: Negative.  Negative for chest pain.  Gastrointestinal: Negative.   Endocrine: Negative.   Genitourinary: Negative.   Musculoskeletal: Negative.   Skin: Negative.   Allergic/Immunologic: Negative.   Neurological: Negative.  Hematological: Negative.   Psychiatric/Behavioral: Negative.    All other systems reviewed and are negative.    Objective:    Physical Exam Vitals reviewed.  Constitutional:      Appearance: Normal appearance.  HENT:     Mouth/Throat:     Mouth: Mucous membranes are moist.  Eyes:     Pupils: Pupils are equal, round, and reactive to light.  Neck:     Vascular: No carotid bruit.  Cardiovascular:     Rate and Rhythm: Normal rate and regular rhythm.     Pulses: Normal pulses.     Heart sounds: Normal heart sounds.  Pulmonary:     Effort: Pulmonary effort is normal.     Breath sounds: Rhonchi present. No rales.  Chest:     Chest wall: No tenderness.  Abdominal:     General: Bowel sounds are normal.     Palpations: Abdomen is soft. There is no hepatomegaly, splenomegaly or mass.     Tenderness: There is no abdominal tenderness.     Hernia: No hernia is present.  Musculoskeletal:     Cervical back: Neck supple.     Right lower leg: No edema.     Left lower leg: No edema.  Skin:    Findings: No rash.  Neurological:     Mental Status: He is alert and oriented to person, place, and time.     Motor: No weakness.  Psychiatric:        Mood and Affect: Mood normal.        Behavior: Behavior normal.    BP (!) 158/80    Pulse 70    Ht 5\' 6"  (1.676 m)    Wt 149 lb 14.4 oz (68 kg)    BMI 24.19 kg/m  Wt Readings from Last 3 Encounters:  06/16/21  149 lb 14.4 oz (68 kg)  04/29/21 152 lb 8 oz (69.2 kg)  02/12/21 149 lb 9.6 oz (67.9 kg)     Health Maintenance Due  Topic Date Due   OPHTHALMOLOGY EXAM  Never done   Hepatitis C Screening  Never done   TETANUS/TDAP  Never done   COLONOSCOPY (Pts 45-38yrs Insurance coverage will need to be confirmed)  Never done   FOOT EXAM  03/08/2020   COVID-19 Vaccine (5 - Booster for [place] series) 11/21/2020   INFLUENZA VACCINE  12/23/2020    There are no preventive care reminders to display for this patient.  Lab Results  Component Value Date   TSH 0.525 09/13/2013   Lab Results  Component Value Date   WBC 11.8 (H) 05/04/2019   HGB 12.2 (L) 12/10/2020   HCT 36.0 (L) 12/10/2020   MCV 95.1 05/04/2019   PLT 236 05/04/2019   Lab Results  Component Value Date   NA 132 (L) 12/10/2020   K 5.8 (H) 12/10/2020   CO2 26 05/04/2019   GLUCOSE 135 (H) 12/10/2020   BUN 24 (H) 12/10/2020   CREATININE 7.50 (H) 12/10/2020   BILITOT 0.4 09/11/2013   ALKPHOS 134 (H) 09/11/2013   AST 32 09/11/2013   ALT 39 09/11/2013   PROT 8.1 09/11/2013   ALBUMIN 2.8 (L) 09/13/2013   CALCIUM 9.0 05/04/2019   ANIONGAP 15 05/04/2019   Lab Results  Component Value Date   CHOL 144 05/05/2019   Lab Results  Component Value Date   HDL 29 (L) 05/05/2019   Lab Results  Component Value Date   LDLCALC 55 05/05/2019   Lab Results  Component Value  Date   TRIG 301 (H) 05/05/2019   Lab Results  Component Value Date   CHOLHDL 5.0 05/05/2019   Lab Results  Component Value Date   HGBA1C 7.0 (A) 02/12/2021      Assessment & Plan:   Problem List Items Addressed This Visit       Respiratory   Acute bronchitis and bronchiolitis    Patient was started on Z-Pak        Endocrine   Hypothyroidism (acquired)    Stable at the present time      Type 2 diabetes mellitus with diabetic neuropathy, unspecified ([place]) - Primary    - The patient's blood sugar is labile on med. - The patient will continue  the current treatment regimen.  - I encouraged the patient to regularly check blood sugar.  - I encouraged the patient to monitor diet. I encouraged the patient to eat low-carb and low-sugar to help prevent blood sugar spikes.  - I encouraged the patient to continue following their prescribed treatment plan for diabetes - I informed the patient to get help if blood sugar drops below 54mg /dL, or if suddenly have trouble thinking clearly or breathing.  Patient was advised to buy a book on diabetes from a local bookstore or from [place]  Patient should read 2 chapters every day to keep the motivation going, this is in addition to some of the materials we provided them from the office.  There are other resources on the Internet like YouTube and wilkipedia to get an education on the diabetes        Nervous and Auditory   Neurocognitive deficits    Stable at the present time        Other   Mixed hyperlipidemia    Hypercholesterolemia  I advised the patient to follow Mediterranean diet This diet is rich in fruits vegetables and whole grain, and This diet is also rich in fish and lean meat Patient should also eat a handful of almonds or walnuts daily Recent heart study indicated that average follow-up on this kind of diet reduces the cardiovascular mortality by 50 to 70%==       No orders of the defined types were placed in this encounter.   Follow-up: No follow-ups on file.    [name], MD

## 2021-06-16 NOTE — Assessment & Plan Note (Signed)
Patient was started on Z-Pak

## 2021-06-16 NOTE — Assessment & Plan Note (Signed)
Hypercholesterolemia  I advised the patient to follow Mediterranean diet This diet is rich in fruits vegetables and whole grain, and This diet is also rich in fish and lean meat Patient should also eat a handful of almonds or walnuts daily Recent heart study indicated that average follow-up on this kind of diet reduces the cardiovascular mortality by 50 to 70%== 

## 2021-06-18 DIAGNOSIS — N186 End stage renal disease: Secondary | ICD-10-CM | POA: Diagnosis not present

## 2021-06-18 DIAGNOSIS — N2581 Secondary hyperparathyroidism of renal origin: Secondary | ICD-10-CM | POA: Diagnosis not present

## 2021-06-18 DIAGNOSIS — Z992 Dependence on renal dialysis: Secondary | ICD-10-CM | POA: Diagnosis not present

## 2021-06-19 ENCOUNTER — Ambulatory Visit (INDEPENDENT_AMBULATORY_CARE_PROVIDER_SITE_OTHER): Payer: Medicare HMO

## 2021-06-19 DIAGNOSIS — Z0000 Encounter for general adult medical examination without abnormal findings: Secondary | ICD-10-CM | POA: Diagnosis not present

## 2021-06-19 NOTE — Progress Notes (Signed)
Subjective:   Joseph Mcpherson is a 76 y.o. male who presents for Medicare Annual/Subsequent preventive examination. I discussed the limitations of evaluation and management by telemedicine and the availability of in person appointments. The patient expressed understanding and agreed to proceed.   Visit performed by audio   Patient location: Home  Provider location: Home  Review of Systems    N/A       Objective:    There were no vitals filed for this visit. There is no height or weight on file to calculate BMI.  Advanced Directives 06/19/2021 12/10/2020 05/05/2019 05/04/2019 01/26/2019 10/22/2017 09/22/2017  Does Patient Have a Medical Advance Directive? Yes Yes No Yes Yes No Yes  Type of Paramedic of [place];Living will [place];Living will - Living will [place];Living will - [place];Living will  Copy of [place] in Chart? - - - - No - copy requested - No - copy requested  Would patient like information on creating a medical advance directive? - - No - Patient declined - - No - Patient declined -    Current Medications (verified) Outpatient Encounter Medications as of 06/19/2021  Medication Sig   albuterol (PROVENTIL) (2.5 MG/3ML) 0.083% nebulizer solution Take 3 mLs (2.5 mg total) by nebulization every 6 (six) hours as needed for wheezing or shortness of breath.   aspirin EC 325 MG EC tablet Take 1 tablet (325 mg total) by mouth daily.   aspirin EC 81 MG tablet Take 81 mg by mouth daily. Swallow whole.   azithromycin (ZITHROMAX) 250 MG tablet Take 2 tablets on day 1, then 1 tablet daily on days 2 through 5   B Complex-C-Folic Acid (RENA-VITE RX) 1 MG TABS Take 1 tablet by mouth daily.    BD PEN NEEDLE NANO 2ND GEN 32G X 4 MM MISC USE AS DIRECTED   calcium acetate (PHOSLO) 667 MG capsule TAKE 2 CAPSULES BY MOUTH 3 TIMES A DAY WITH MEAL   Cholecalciferol (VITAMIN D3) 5000 units  TABS Take 5,000 Units by mouth daily.    Continuous Blood Gluc Sensor (FREESTYLE LIBRE 14 DAY SENSOR) MISC APPLY EVERY 14 (FOURTEEN) DAYS.   docusate sodium (COLACE) 100 MG capsule TAKE 1 CAPSULE BY MOUTH TWICE A DAY   donepezil (ARICEPT) 10 MG tablet TAKE 1 TABLET BY MOUTH EVERY DAY   gabapentin (NEURONTIN) 300 MG capsule TAKE 1 CAPSULE BY MOUTH EVERY DAY AT BEDTIME AS DIRECTED (Patient taking differently: Take 300 mg by mouth at bedtime.)   hydrALAZINE (APRESOLINE) 50 MG tablet Take 1 tablet (50 mg total) by mouth 3 (three) times daily. (Patient taking differently: Take 25 mg by mouth in the morning and at bedtime.)   HYDROcodone-acetaminophen (NORCO) 5-325 MG tablet Take 1 tablet by mouth every 6 (six) hours as needed for moderate pain.   insulin aspart (NOVOLOG) 100 UNIT/ML FlexPen Inject 10 Units into the skin at bedtime.   insulin detemir (LEVEMIR) 100 UNIT/ML injection Inject 20 Units into the skin at bedtime.   levocetirizine (XYZAL) 5 MG tablet TAKE 1 TABLET BY MOUTH EVERY DAY IN THE EVENING   levothyroxine (SYNTHROID) 137 MCG tablet TAKE 1 TABLET BY MOUTH EVERY DAY   losartan (COZAAR) 25 MG tablet TAKE 1 TABLET BY MOUTH EVERY EVENING   metoprolol succinate (TOPROL-XL) 50 MG 24 hr tablet TAKE 1 TABLET BY MOUTH EVERY DAY   Multiple Vitamins-Minerals (MULTIVITAMIN WITH MINERALS) tablet Take 1 tablet by mouth daily.  omeprazole (PRILOSEC) 40 MG capsule TAKE 1 CAPSULE BY MOUTH EVERY DAY   pantoprazole (PROTONIX) 40 MG tablet Take 1 tablet (40 mg total) by mouth daily.   sevelamer carbonate (RENVELA) 2.4 g PACK Take 2.4 g by mouth daily.   simvastatin (ZOCOR) 20 MG tablet TAKE 1 TABLET BY MOUTH EVERY DAY IN THE EVENING   sitaGLIPtin (JANUVIA) 50 MG tablet Take 50 mg by mouth every evening.    No facility-administered encounter medications on file as of 06/19/2021.    Allergies (verified) Patient has no known allergies.   History: Past Medical History:  Diagnosis Date   Clotting  disorder ([place])    per daughter, "when patient travels, his feet swell"   Diabetes mellitus without complication ([place])    type II   Diabetic eye exam ([place]) 12/2019   ESRD (end stage renal disease) ([place])    MWF- [place]   GERD (gastroesophageal reflux disease)    [name] (hard of hearing)    Hyperlipidemia    Hypertension    Hypothyroidism    Thyroid disease    Past Surgical History:  Procedure Laterality Date   AV FISTULA PLACEMENT Right 10/22/2017   Procedure: ARTERIOVENOUS (AV) BRACHIOCEPHALIC FISTULA CREATION RIGHT UPPER ARM;  Surgeon: Conrad Barnhart, MD;  Location: [facility];  Service: Vascular;  Laterality: Right;   AV FISTULA PLACEMENT Left 02/03/2019   Procedure: INSERTION OF ARTERIOVENOUS (AV) GORE-TEX GRAFT ARM (BRACHIAL AXILLARY );  Surgeon: [name], MD;  Location: [facility];  Service: Vascular;  Laterality: Left;   AV FISTULA PLACEMENT Left 12/10/2020   Procedure: LEFT ARM BRACHIOCEPHALIC ARTERIOVENOUS (AV) FISTULA CREATION;  Surgeon: [name], MD;  Location: [facility];  Service: Vascular;  Laterality: Left;   UPPER EXTREMITY ANGIOGRAPHY Left 09/19/2019   Procedure: UPPER EXTREMITY ANGIOGRAPHY;  Surgeon: [name], MD;  Location: [facility];  Service: Cardiovascular;  Laterality: Left;   Family History  Problem Relation Age of Onset   Diabetes Mother    Hyperlipidemia Mother    Hypertension Father    Social History   Socioeconomic History   Marital status: Married    Spouse name: Not on file   Number of children: Not on file   Years of education: 12   Highest education level: 12th grade  Occupational History   Occupation: Retired  Tobacco Use   Smoking status: Never   Smokeless tobacco: Former    Types: Chief of Staff Use: Never used  Substance and Sexual Activity   Alcohol use: No   Drug use: No   Sexual activity: Not Currently  Other Topics Concern   Not on file  Social History Narrative   Not on file   Social  Determinants of Health   Financial Resource Strain: Low Risk    Difficulty of Paying Living Expenses: Not hard at all  Food Insecurity: No Food Insecurity   Worried About Charity fundraiser in the Last Year: Never true   [place] in the Last Year: Never true  Transportation Needs: No Transportation Needs   Lack of Transportation (Medical): No   Lack of Transportation (Non-Medical): No  Physical Activity: Sufficiently Active   Days of Exercise per Week: 7 days   Minutes of Exercise per Session: 30 min  Stress: No Stress Concern Present   Feeling of Stress : Not at all  Social Connections: Socially Integrated   Frequency of Communication with Friends and Family: More than three  times a week   Frequency of Social Gatherings with Friends and Family: More than three times a week   Attends Religious Services: More than 4 times per year   Active Member of [employer] or Organizations: Yes   Attends Music therapist: More than 4 times per year   Marital Status: Married    Tobacco Counseling Counseling given: Not Answered   Clinical Intake:  Pre-visit preparation completed: No  Pain : No/denies pain     Diabetes: Yes  How often do you need to have someone help you when you read instructions, pamphlets, or other written materials from your doctor or pharmacy?: 1 - Never What is the last grade level you completed in school?: 12th grade  Diabetic? Yes  Interpreter Needed?: No (On some things, may need interpreter)  Information entered by :: Anson [place] CMA   Activities of Daily Living In your present state of health, do you have any difficulty performing the following activities: 06/19/2021 12/10/2020  Hearing? Y -  Comment Pt wears hearing aids at time -  Vision? N -  Difficulty concentrating or making decisions? N -  Walking or climbing stairs? N -  Dressing or bathing? N -  Doing errands, shopping? N N  Preparing Food and eating ? N -  Using the  Toilet? N -  In the past six months, have you accidently leaked urine? N -  Do you have problems with loss of bowel control? N -  Managing your Medications? N -  Managing your Finances? N -  Housekeeping or managing your Housekeeping? N -  Some recent data might be hidden    Patient Care Team: [name], MD as PCP - General (Internal Medicine) Minna Merritts, MD as Consulting Physician (Cardiology) Elmarie Shiley, MD as Consulting Physician (Nephrology)  Indicate any recent Medical Services you may have received from other than [facility] providers in the past year (date may be approximate).     Assessment:   This is a routine wellness examination for Joseph Mcpherson.  Hearing/Vision screen No results found.  Dietary issues and exercise activities discussed:     Goals Addressed   None    Depression Screen PHQ 2/9 Scores 06/19/2021 03/12/2020  PHQ - 2 Score 0 0    Fall Risk Fall Risk  06/19/2021 03/12/2020  Falls in the past year? 0 0  Number falls in past yr: 0 0  Injury with Fall? 0 0  Risk for fall due to : No Fall Risks -  Follow up Falls evaluation completed -    FALL RISK PREVENTION PERTAINING TO THE HOME:  Any stairs in or around the home? Yes  If so, are there any without handrails? Yes  Home free of loose throw rugs in walkways, pet beds, electrical cords, etc? Yes  Adequate lighting in your home to reduce risk of falls? Yes   ASSISTIVE DEVICES UTILIZED TO PREVENT FALLS:  Life alert? No  Use of a cane, walker or w/c? No  Grab bars in the bathroom? Yes  Shower chair or bench in shower? Yes  Elevated toilet seat or a handicapped toilet? Yes   TIMED UP AND GO:  Was the test performed? No .  Length of time to ambulate 10 feet: 0 sec.     Cognitive Function:        Immunizations Immunization History  Administered Date(s) Administered   Influenza, High Dose Seasonal PF 02/04/2018   Influenza-Unspecified 04/02/2021   PFIZER Comirnaty(Gray Top)Covid-19  Tri-[place]  Vaccine 06/27/2019, 07/20/2019   PFIZER(Purple Top)SARS-COV-2 Vaccination 02/06/2020, 09/26/2020   Pfizer Covid-19 Vaccine Bivalent Booster 5y-11y 04/02/2021   Pneumococcal Conjugate-13 03/29/2017   Pneumococcal Polysaccharide-23 11/19/2014   Zoster Recombinat (Shingrix) 02/19/2018, 08/04/2018    TDAP status: Due, Education has been provided regarding the importance of this vaccine. Advised may receive this vaccine at local pharmacy or Health Dept. Aware to provide a copy of the vaccination record if obtained from local pharmacy or Health Dept. Verbalized acceptance and understanding.  Flu Vaccine status: Up to date  Pneumococcal vaccine status: Up to date  Covid-19 vaccine status: Completed vaccines  Qualifies for Shingles Vaccine? Yes   Zostavax completed Yes   Shingrix Completed?: Yes  Screening Tests Health Maintenance  Topic Date Due   OPHTHALMOLOGY EXAM  Never done   Hepatitis C Screening  Never done   TETANUS/TDAP  Never done   FOOT EXAM  03/08/2020   HEMOGLOBIN A1C  08/12/2021   COLONOSCOPY (Pts 45-43yrs Insurance coverage will need to be confirmed)  12/27/2024   Pneumonia Vaccine 52+ Years old  Completed   INFLUENZA VACCINE  Completed   COVID-19 Vaccine  Completed   Zoster Vaccines- Shingrix  Completed   HPV VACCINES  Aged Out    Health Maintenance  Health Maintenance Due  Topic Date Due   OPHTHALMOLOGY EXAM  Never done   Hepatitis C Screening  Never done   TETANUS/TDAP  Never done   FOOT EXAM  03/08/2020    Colorectal cancer screening: No longer required.   Lung Cancer Screening: (Low Dose CT Chest recommended if Age 22-80 years, 30 pack-year currently smoking OR have quit w/in 15years.) does not qualify.   Lung Cancer Screening Referral: No  Additional Screening:  Hepatitis C Screening: does qualify; Completed No  Vision Screening: Recommended annual ophthalmology exams for early detection of glaucoma and other disorders of the eye. Is  the patient up to date with their annual eye exam?  Yes  Who is the provider or what is the name of the office in which the patient attends annual eye exams? [facility] If pt is not established with a provider, would they like to be referred to a provider to establish care? No .   Dental Screening: Recommended annual dental exams for proper oral hygiene  Community Resource Referral / Chronic Care Management: CRR required this visit?  No   CCM required this visit?  No      Plan:     I have personally reviewed and noted the following in the patients chart:   Medical and social history Use of alcohol, tobacco or illicit drugs  Current medications and supplements including opioid prescriptions. Patient is currently taking opioid prescriptions. Information provided to patient regarding non-opioid alternatives. Patient advised to discuss non-opioid treatment plan with their provider. Functional ability and status Nutritional status Physical activity Advanced directives List of other physicians Hospitalizations, surgeries, and ER visits in previous 12 months Vitals Screenings to include cognitive, depression, and falls Referrals and appointments  In addition, I have reviewed and discussed with patient certain preventive protocols, quality metrics, and best practice recommendations. A written personalized care plan for preventive services as well as general preventive health recommendations were provided to patient.    Joseph Mcpherson , Thank you for taking time to come for your Medicare Wellness Visit. I appreciate your ongoing commitment to your health goals. Please review the following plan we discussed and let me know if I can assist you in the future.  These are the goals we discussed:  Goals   None     This is a list of the screening recommended for you and due dates:  Health Maintenance  Topic Date Due   Eye exam for diabetics  Never done   Hepatitis C Screening:  USPSTF Recommendation to screen - Ages 66-79 yo.  Never done   Tetanus Vaccine  Never done   Complete foot exam   03/08/2020   Hemoglobin A1C  08/12/2021   Colon Cancer Screening  12/27/2024   Pneumonia Vaccine  Completed   Flu Shot  Completed   COVID-19 Vaccine  Completed   Zoster (Shingles) Vaccine  Completed   HPV Vaccine  Aged [address] Joseph Mcpherson, [place]   06/19/2021

## 2021-06-20 DIAGNOSIS — Z992 Dependence on renal dialysis: Secondary | ICD-10-CM | POA: Diagnosis not present

## 2021-06-20 DIAGNOSIS — N2581 Secondary hyperparathyroidism of renal origin: Secondary | ICD-10-CM | POA: Diagnosis not present

## 2021-06-20 DIAGNOSIS — N186 End stage renal disease: Secondary | ICD-10-CM | POA: Diagnosis not present

## 2021-06-20 NOTE — Progress Notes (Signed)
I have reviewed this visit and agree with the documentation.   

## 2021-06-23 DIAGNOSIS — Z992 Dependence on renal dialysis: Secondary | ICD-10-CM | POA: Diagnosis not present

## 2021-06-23 DIAGNOSIS — N186 End stage renal disease: Secondary | ICD-10-CM | POA: Diagnosis not present

## 2021-06-23 DIAGNOSIS — N2581 Secondary hyperparathyroidism of renal origin: Secondary | ICD-10-CM | POA: Diagnosis not present

## 2021-06-24 DIAGNOSIS — Z992 Dependence on renal dialysis: Secondary | ICD-10-CM | POA: Diagnosis not present

## 2021-06-24 DIAGNOSIS — N186 End stage renal disease: Secondary | ICD-10-CM | POA: Diagnosis not present

## 2021-06-24 DIAGNOSIS — E1122 Type 2 diabetes mellitus with diabetic chronic kidney disease: Secondary | ICD-10-CM | POA: Diagnosis not present

## 2021-06-25 DIAGNOSIS — N2581 Secondary hyperparathyroidism of renal origin: Secondary | ICD-10-CM | POA: Diagnosis not present

## 2021-06-25 DIAGNOSIS — N186 End stage renal disease: Secondary | ICD-10-CM | POA: Diagnosis not present

## 2021-06-25 DIAGNOSIS — Z992 Dependence on renal dialysis: Secondary | ICD-10-CM | POA: Diagnosis not present

## 2021-06-27 DIAGNOSIS — N2581 Secondary hyperparathyroidism of renal origin: Secondary | ICD-10-CM | POA: Diagnosis not present

## 2021-06-27 DIAGNOSIS — N186 End stage renal disease: Secondary | ICD-10-CM | POA: Diagnosis not present

## 2021-06-27 DIAGNOSIS — Z992 Dependence on renal dialysis: Secondary | ICD-10-CM | POA: Diagnosis not present

## 2021-06-30 DIAGNOSIS — Z992 Dependence on renal dialysis: Secondary | ICD-10-CM | POA: Diagnosis not present

## 2021-06-30 DIAGNOSIS — N2581 Secondary hyperparathyroidism of renal origin: Secondary | ICD-10-CM | POA: Diagnosis not present

## 2021-06-30 DIAGNOSIS — N186 End stage renal disease: Secondary | ICD-10-CM | POA: Diagnosis not present

## 2021-07-02 DIAGNOSIS — N186 End stage renal disease: Secondary | ICD-10-CM | POA: Diagnosis not present

## 2021-07-02 DIAGNOSIS — N2581 Secondary hyperparathyroidism of renal origin: Secondary | ICD-10-CM | POA: Diagnosis not present

## 2021-07-02 DIAGNOSIS — Z992 Dependence on renal dialysis: Secondary | ICD-10-CM | POA: Diagnosis not present

## 2021-07-04 DIAGNOSIS — Z992 Dependence on renal dialysis: Secondary | ICD-10-CM | POA: Diagnosis not present

## 2021-07-04 DIAGNOSIS — N2581 Secondary hyperparathyroidism of renal origin: Secondary | ICD-10-CM | POA: Diagnosis not present

## 2021-07-04 DIAGNOSIS — N186 End stage renal disease: Secondary | ICD-10-CM | POA: Diagnosis not present

## 2021-07-06 DIAGNOSIS — J9601 Acute respiratory failure with hypoxia: Secondary | ICD-10-CM | POA: Diagnosis not present

## 2021-07-06 DIAGNOSIS — I132 Hypertensive heart and chronic kidney disease with heart failure and with stage 5 chronic kidney disease, or end stage renal disease: Secondary | ICD-10-CM | POA: Diagnosis not present

## 2021-07-06 DIAGNOSIS — Z992 Dependence on renal dialysis: Secondary | ICD-10-CM | POA: Diagnosis not present

## 2021-07-06 DIAGNOSIS — I4891 Unspecified atrial fibrillation: Secondary | ICD-10-CM | POA: Diagnosis not present

## 2021-07-06 DIAGNOSIS — I5023 Acute on chronic systolic (congestive) heart failure: Secondary | ICD-10-CM | POA: Diagnosis not present

## 2021-07-06 DIAGNOSIS — N186 End stage renal disease: Secondary | ICD-10-CM | POA: Diagnosis not present

## 2021-07-06 DIAGNOSIS — Z951 Presence of aortocoronary bypass graft: Secondary | ICD-10-CM | POA: Diagnosis not present

## 2021-07-06 DIAGNOSIS — E785 Hyperlipidemia, unspecified: Secondary | ICD-10-CM | POA: Diagnosis not present

## 2021-07-06 DIAGNOSIS — I214 Non-ST elevation (NSTEMI) myocardial infarction: Secondary | ICD-10-CM | POA: Diagnosis not present

## 2021-07-07 DIAGNOSIS — N2581 Secondary hyperparathyroidism of renal origin: Secondary | ICD-10-CM | POA: Diagnosis not present

## 2021-07-07 DIAGNOSIS — Z992 Dependence on renal dialysis: Secondary | ICD-10-CM | POA: Diagnosis not present

## 2021-07-07 DIAGNOSIS — N186 End stage renal disease: Secondary | ICD-10-CM | POA: Diagnosis not present

## 2021-07-09 ENCOUNTER — Other Ambulatory Visit: Payer: Self-pay | Admitting: Internal Medicine

## 2021-07-09 DIAGNOSIS — Z992 Dependence on renal dialysis: Secondary | ICD-10-CM | POA: Diagnosis not present

## 2021-07-09 DIAGNOSIS — K2100 Gastro-esophageal reflux disease with esophagitis, without bleeding: Secondary | ICD-10-CM

## 2021-07-09 DIAGNOSIS — N2581 Secondary hyperparathyroidism of renal origin: Secondary | ICD-10-CM | POA: Diagnosis not present

## 2021-07-09 DIAGNOSIS — N186 End stage renal disease: Secondary | ICD-10-CM | POA: Diagnosis not present

## 2021-07-11 DIAGNOSIS — N186 End stage renal disease: Secondary | ICD-10-CM | POA: Diagnosis not present

## 2021-07-11 DIAGNOSIS — N2581 Secondary hyperparathyroidism of renal origin: Secondary | ICD-10-CM | POA: Diagnosis not present

## 2021-07-11 DIAGNOSIS — Z992 Dependence on renal dialysis: Secondary | ICD-10-CM | POA: Diagnosis not present

## 2021-07-14 DIAGNOSIS — N186 End stage renal disease: Secondary | ICD-10-CM | POA: Diagnosis not present

## 2021-07-14 DIAGNOSIS — Z992 Dependence on renal dialysis: Secondary | ICD-10-CM | POA: Diagnosis not present

## 2021-07-14 DIAGNOSIS — N2581 Secondary hyperparathyroidism of renal origin: Secondary | ICD-10-CM | POA: Diagnosis not present

## 2021-07-16 DIAGNOSIS — Z992 Dependence on renal dialysis: Secondary | ICD-10-CM | POA: Diagnosis not present

## 2021-07-16 DIAGNOSIS — N186 End stage renal disease: Secondary | ICD-10-CM | POA: Diagnosis not present

## 2021-07-16 DIAGNOSIS — N2581 Secondary hyperparathyroidism of renal origin: Secondary | ICD-10-CM | POA: Diagnosis not present

## 2021-07-17 DIAGNOSIS — I871 Compression of vein: Secondary | ICD-10-CM | POA: Diagnosis not present

## 2021-07-17 DIAGNOSIS — T82858A Stenosis of vascular prosthetic devices, implants and grafts, initial encounter: Secondary | ICD-10-CM | POA: Diagnosis not present

## 2021-07-17 DIAGNOSIS — N186 End stage renal disease: Secondary | ICD-10-CM | POA: Diagnosis not present

## 2021-07-17 DIAGNOSIS — Z992 Dependence on renal dialysis: Secondary | ICD-10-CM | POA: Diagnosis not present

## 2021-07-18 DIAGNOSIS — N2581 Secondary hyperparathyroidism of renal origin: Secondary | ICD-10-CM | POA: Diagnosis not present

## 2021-07-18 DIAGNOSIS — Z992 Dependence on renal dialysis: Secondary | ICD-10-CM | POA: Diagnosis not present

## 2021-07-18 DIAGNOSIS — N186 End stage renal disease: Secondary | ICD-10-CM | POA: Diagnosis not present

## 2021-07-21 DIAGNOSIS — N186 End stage renal disease: Secondary | ICD-10-CM | POA: Diagnosis not present

## 2021-07-21 DIAGNOSIS — Z992 Dependence on renal dialysis: Secondary | ICD-10-CM | POA: Diagnosis not present

## 2021-07-21 DIAGNOSIS — N2581 Secondary hyperparathyroidism of renal origin: Secondary | ICD-10-CM | POA: Diagnosis not present

## 2021-07-22 DIAGNOSIS — E1122 Type 2 diabetes mellitus with diabetic chronic kidney disease: Secondary | ICD-10-CM | POA: Diagnosis not present

## 2021-07-22 DIAGNOSIS — Z992 Dependence on renal dialysis: Secondary | ICD-10-CM | POA: Diagnosis not present

## 2021-07-22 DIAGNOSIS — N186 End stage renal disease: Secondary | ICD-10-CM | POA: Diagnosis not present

## 2021-07-23 DIAGNOSIS — N2581 Secondary hyperparathyroidism of renal origin: Secondary | ICD-10-CM | POA: Diagnosis not present

## 2021-07-23 DIAGNOSIS — N186 End stage renal disease: Secondary | ICD-10-CM | POA: Diagnosis not present

## 2021-07-23 DIAGNOSIS — Z992 Dependence on renal dialysis: Secondary | ICD-10-CM | POA: Diagnosis not present

## 2021-07-25 DIAGNOSIS — N186 End stage renal disease: Secondary | ICD-10-CM | POA: Diagnosis not present

## 2021-07-25 DIAGNOSIS — Z992 Dependence on renal dialysis: Secondary | ICD-10-CM | POA: Diagnosis not present

## 2021-07-25 DIAGNOSIS — N2581 Secondary hyperparathyroidism of renal origin: Secondary | ICD-10-CM | POA: Diagnosis not present

## 2021-07-26 ENCOUNTER — Other Ambulatory Visit: Payer: Self-pay | Admitting: Internal Medicine

## 2021-07-28 DIAGNOSIS — N186 End stage renal disease: Secondary | ICD-10-CM | POA: Diagnosis not present

## 2021-07-28 DIAGNOSIS — N2581 Secondary hyperparathyroidism of renal origin: Secondary | ICD-10-CM | POA: Diagnosis not present

## 2021-07-28 DIAGNOSIS — Z992 Dependence on renal dialysis: Secondary | ICD-10-CM | POA: Diagnosis not present

## 2021-07-30 DIAGNOSIS — N2581 Secondary hyperparathyroidism of renal origin: Secondary | ICD-10-CM | POA: Diagnosis not present

## 2021-07-30 DIAGNOSIS — Z992 Dependence on renal dialysis: Secondary | ICD-10-CM | POA: Diagnosis not present

## 2021-07-30 DIAGNOSIS — N186 End stage renal disease: Secondary | ICD-10-CM | POA: Diagnosis not present

## 2021-08-01 DIAGNOSIS — Z992 Dependence on renal dialysis: Secondary | ICD-10-CM | POA: Diagnosis not present

## 2021-08-01 DIAGNOSIS — N186 End stage renal disease: Secondary | ICD-10-CM | POA: Diagnosis not present

## 2021-08-01 DIAGNOSIS — N2581 Secondary hyperparathyroidism of renal origin: Secondary | ICD-10-CM | POA: Diagnosis not present

## 2021-08-04 ENCOUNTER — Ambulatory Visit: Payer: Medicare HMO | Admitting: Internal Medicine

## 2021-08-04 DIAGNOSIS — N2581 Secondary hyperparathyroidism of renal origin: Secondary | ICD-10-CM | POA: Diagnosis not present

## 2021-08-04 DIAGNOSIS — Z992 Dependence on renal dialysis: Secondary | ICD-10-CM | POA: Diagnosis not present

## 2021-08-04 DIAGNOSIS — N186 End stage renal disease: Secondary | ICD-10-CM | POA: Diagnosis not present

## 2021-08-06 DIAGNOSIS — N186 End stage renal disease: Secondary | ICD-10-CM | POA: Diagnosis not present

## 2021-08-06 DIAGNOSIS — N2581 Secondary hyperparathyroidism of renal origin: Secondary | ICD-10-CM | POA: Diagnosis not present

## 2021-08-06 DIAGNOSIS — Z992 Dependence on renal dialysis: Secondary | ICD-10-CM | POA: Diagnosis not present

## 2021-08-08 DIAGNOSIS — Z992 Dependence on renal dialysis: Secondary | ICD-10-CM | POA: Diagnosis not present

## 2021-08-08 DIAGNOSIS — N2581 Secondary hyperparathyroidism of renal origin: Secondary | ICD-10-CM | POA: Diagnosis not present

## 2021-08-08 DIAGNOSIS — N186 End stage renal disease: Secondary | ICD-10-CM | POA: Diagnosis not present

## 2021-08-11 DIAGNOSIS — Z992 Dependence on renal dialysis: Secondary | ICD-10-CM | POA: Diagnosis not present

## 2021-08-11 DIAGNOSIS — N186 End stage renal disease: Secondary | ICD-10-CM | POA: Diagnosis not present

## 2021-08-11 DIAGNOSIS — N2581 Secondary hyperparathyroidism of renal origin: Secondary | ICD-10-CM | POA: Diagnosis not present

## 2021-08-13 DIAGNOSIS — N186 End stage renal disease: Secondary | ICD-10-CM | POA: Diagnosis not present

## 2021-08-13 DIAGNOSIS — N2581 Secondary hyperparathyroidism of renal origin: Secondary | ICD-10-CM | POA: Diagnosis not present

## 2021-08-13 DIAGNOSIS — Z992 Dependence on renal dialysis: Secondary | ICD-10-CM | POA: Diagnosis not present

## 2021-08-15 ENCOUNTER — Other Ambulatory Visit: Payer: Self-pay | Admitting: Nurse Practitioner

## 2021-08-15 DIAGNOSIS — N2581 Secondary hyperparathyroidism of renal origin: Secondary | ICD-10-CM | POA: Diagnosis not present

## 2021-08-15 DIAGNOSIS — N186 End stage renal disease: Secondary | ICD-10-CM | POA: Diagnosis not present

## 2021-08-15 DIAGNOSIS — Z992 Dependence on renal dialysis: Secondary | ICD-10-CM | POA: Diagnosis not present

## 2021-08-15 MED ORDER — BENZONATATE 100 MG PO CAPS: 100.0000 mg | ORAL_CAPSULE | Freq: Two times a day (BID) | ORAL | 0 refills | Status: DC | PRN

## 2021-08-18 ENCOUNTER — Other Ambulatory Visit: Payer: Self-pay

## 2021-08-18 ENCOUNTER — Encounter: Payer: Self-pay | Admitting: Internal Medicine

## 2021-08-18 ENCOUNTER — Ambulatory Visit (INDEPENDENT_AMBULATORY_CARE_PROVIDER_SITE_OTHER): Payer: Medicare HMO | Admitting: Internal Medicine

## 2021-08-18 VITALS — BP 120/56 | HR 63 | Ht 66.0 in | Wt 145.6 lb

## 2021-08-18 DIAGNOSIS — Z794 Long term (current) use of insulin: Secondary | ICD-10-CM | POA: Diagnosis not present

## 2021-08-18 DIAGNOSIS — H90A21 Sensorineural hearing loss, unilateral, right ear, with restricted hearing on the contralateral side: Secondary | ICD-10-CM

## 2021-08-18 DIAGNOSIS — E119 Type 2 diabetes mellitus without complications: Secondary | ICD-10-CM | POA: Diagnosis not present

## 2021-08-18 DIAGNOSIS — N2581 Secondary hyperparathyroidism of renal origin: Secondary | ICD-10-CM | POA: Diagnosis not present

## 2021-08-18 DIAGNOSIS — K2100 Gastro-esophageal reflux disease with esophagitis, without bleeding: Secondary | ICD-10-CM

## 2021-08-18 DIAGNOSIS — E039 Hypothyroidism, unspecified: Secondary | ICD-10-CM

## 2021-08-18 DIAGNOSIS — G301 Alzheimer's disease with late onset: Secondary | ICD-10-CM

## 2021-08-18 DIAGNOSIS — Z992 Dependence on renal dialysis: Secondary | ICD-10-CM | POA: Diagnosis not present

## 2021-08-18 DIAGNOSIS — I10 Essential (primary) hypertension: Secondary | ICD-10-CM | POA: Diagnosis not present

## 2021-08-18 DIAGNOSIS — N186 End stage renal disease: Secondary | ICD-10-CM | POA: Diagnosis not present

## 2021-08-18 DIAGNOSIS — I739 Peripheral vascular disease, unspecified: Secondary | ICD-10-CM | POA: Diagnosis not present

## 2021-08-18 NOTE — Assessment & Plan Note (Signed)

## 2021-08-18 NOTE — Assessment & Plan Note (Signed)
Blood pressure is stable at the present time 

## 2021-08-18 NOTE — Assessment & Plan Note (Signed)
He denies any claudication ?

## 2021-08-18 NOTE — Progress Notes (Signed)
? ?Established Patient Office Visit ? ?Subjective:  ?Patient ID: Joseph Mcpherson, male    DOB: September 17, 1945  Age: 76 y.o. MRN: [id number] ? ?CC:  ?Chief Complaint  ?Patient presents with  ? Cough  ?  Patient is here today for recheck of cough. Patient has had cough for over 3 weeks. Patient has finished the antibiotic. Patient would like to have chest xray.   ? ? ?Cough ? ? ?Joseph Mcpherson presents for general checkup.  He denies any chest pain or shortness of breath.  He does not smoke does not drink.  He has been complaining of cough recently over 3 weeks ? ?Past Medical History:  ?Diagnosis Date  ? Clotting disorder ([place])   ? per daughter, "when patient travels, his feet swell"  ? Diabetes mellitus without complication ([place])   ? type II  ? Diabetic eye exam ([place]) 12/2019  ? ESRD (end stage renal disease) ([place])   ? MWF- [facility]  ? GERD (gastroesophageal reflux disease)   ? [name] (hard of hearing)   ? Hyperlipidemia   ? Hypertension   ? Hypothyroidism   ? Thyroid disease   ? ? ?Past Surgical History:  ?Procedure Laterality Date  ? AV FISTULA PLACEMENT Right 10/22/2017  ? Procedure: ARTERIOVENOUS (AV) BRACHIOCEPHALIC FISTULA CREATION RIGHT UPPER ARM;  Surgeon: Conrad Center, MD;  Location: [facility];  Service: Vascular;  Laterality: Right;  ? AV FISTULA PLACEMENT Left 02/03/2019  ? Procedure: INSERTION OF ARTERIOVENOUS (AV) GORE-TEX GRAFT ARM (BRACHIAL AXILLARY );  Surgeon: [name], MD;  Location: [facility];  Service: Vascular;  Laterality: Left;  ? AV FISTULA PLACEMENT Left 12/10/2020  ? Procedure: LEFT ARM BRACHIOCEPHALIC ARTERIOVENOUS (AV) FISTULA CREATION;  Surgeon: [name], MD;  Location: [facility];  Service: Vascular;  Laterality: Left;  ? UPPER EXTREMITY ANGIOGRAPHY Left 09/19/2019  ? Procedure: UPPER EXTREMITY ANGIOGRAPHY;  Surgeon: [name], MD;  Location: [facility];  Service: Cardiovascular;  Laterality: Left;  ? ? ?Family History  ?Problem Relation Age of Onset  ? Diabetes Mother   ?  Hyperlipidemia Mother   ? Hypertension Father   ? ? ?Social History  ? ?Socioeconomic History  ? Marital status: Married  ?  Spouse name: Not on file  ? Number of children: Not on file  ? Years of education: 74  ? Highest education level: 12th grade  ?Occupational History  ? Occupation: Retired  ?Tobacco Use  ? Smoking status: Never  ? Smokeless tobacco: Former  ?  Types: Snuff  ?Vaping Use  ? Vaping Use: Never used  ?Substance and Sexual Activity  ? Alcohol use: No  ? Drug use: No  ? Sexual activity: Not Currently  ?Other Topics Concern  ? Not on file  ?Social History Narrative  ? Not on file  ? ?Social Determinants of Health  ? ?Financial Resource Strain: Low Risk   ? Difficulty of Paying Living Expenses: Not hard at all  ?Food Insecurity: No Food Insecurity  ? Worried About Charity fundraiser in the Last Year: Never true  ? Ran Out of Food in the Last Year: Never true  ?Transportation Needs: No Transportation Needs  ? Lack of Transportation (Medical): No  ? Lack of Transportation (Non-Medical): No  ?Physical Activity: Sufficiently Active  ? Days of Exercise per Week: 7 days  ? Minutes of Exercise per Session: 30 min  ?Stress: No Stress Concern Present  ? Feeling of Stress : Not at all  ?Social Connections:  Socially Integrated  ? Frequency of Communication with Friends and Family: More than three times a week  ? Frequency of Social Gatherings with Friends and Family: More than three times a week  ? Attends Religious Services: More than 4 times per year  ? Active Member of Clubs or Organizations: Yes  ? Attends Archivist Meetings: More than 4 times per year  ? Marital Status: Married  ?Intimate Partner Violence: Not At Risk  ? Fear of Current or Ex-Partner: No  ? Emotionally Abused: No  ? Physically Abused: No  ? Sexually Abused: No  ? ? ? ?Current Outpatient Medications:  ?  albuterol (PROVENTIL) (2.5 MG/3ML) 0.083% nebulizer solution, Take 3 mLs (2.5 mg total) by nebulization every 6 (six) hours as  needed for wheezing or shortness of breath., Disp: 150 mL, Rfl: 1 ?  aspirin EC 325 MG EC tablet, Take 1 tablet (325 mg total) by mouth daily., Disp: 30 tablet, Rfl: 3 ?  aspirin EC 81 MG tablet, Take 81 mg by mouth daily. Swallow whole., Disp: , Rfl:  ?  B Complex-C-Folic Acid (RENA-VITE RX) 1 MG TABS, Take 1 tablet by mouth daily. , Disp: , Rfl:  ?  BD PEN NEEDLE NANO 2ND GEN 32G X 4 MM MISC, USE AS DIRECTED, Disp: 300 each, Rfl: 3 ?  benzonatate (TESSALON) 100 MG capsule, Take 1 capsule (100 mg total) by mouth 2 (two) times daily as needed for cough., Disp: 20 capsule, Rfl: 0 ?  calcium acetate (PHOSLO) 667 MG capsule, TAKE 2 CAPSULES BY MOUTH 3 TIMES A DAY WITH MEAL, Disp: 180 capsule, Rfl: 4 ?  Cholecalciferol (VITAMIN D3) 5000 units TABS, Take 5,000 Units by mouth daily. , Disp: , Rfl:  ?  Continuous Blood Gluc Sensor (FREESTYLE LIBRE 14 DAY SENSOR) MISC, APPLY EVERY 14 (FOURTEEN) DAYS., Disp: 2 each, Rfl: 3 ?  docusate sodium (COLACE) 100 MG capsule, TAKE 1 CAPSULE BY MOUTH TWICE A DAY, Disp: 60 capsule, Rfl: 1 ?  donepezil (ARICEPT) 10 MG tablet, TAKE 1 TABLET BY MOUTH EVERY DAY, Disp: 90 tablet, Rfl: 3 ?  gabapentin (NEURONTIN) 300 MG capsule, TAKE 1 CAPSULE BY MOUTH EVERY DAY AT BEDTIME AS DIRECTED (Patient taking differently: Take 300 mg by mouth at bedtime.), Disp: 90 capsule, Rfl: 3 ?  hydrALAZINE (APRESOLINE) 50 MG tablet, Take 1 tablet (50 mg total) by mouth 3 (three) times daily. (Patient taking differently: Take 25 mg by mouth in the morning and at bedtime.), Disp: 90 tablet, Rfl: 6 ?  HYDROcodone-acetaminophen (NORCO) 5-325 MG tablet, Take 1 tablet by mouth every 6 (six) hours as needed for moderate pain., Disp: 10 tablet, Rfl: 0 ?  insulin aspart (NOVOLOG) 100 UNIT/ML FlexPen, Inject 10 Units into the skin at bedtime., Disp: , Rfl:  ?  insulin detemir (LEVEMIR) 100 UNIT/ML injection, Inject 20 Units into the skin at bedtime., Disp: , Rfl:  ?  levocetirizine (XYZAL) 5 MG tablet, TAKE 1 TABLET BY  MOUTH EVERY DAY IN THE EVENING, Disp: 90 tablet, Rfl: 1 ?  levothyroxine (SYNTHROID) 137 MCG tablet, TAKE 1 TABLET BY MOUTH EVERY DAY, Disp: 90 tablet, Rfl: 3 ?  losartan (COZAAR) 25 MG tablet, TAKE 1 TABLET BY MOUTH EVERY EVENING, Disp: 90 tablet, Rfl: 3 ?  metoprolol succinate (TOPROL-XL) 50 MG 24 hr tablet, TAKE 1 TABLET BY MOUTH EVERY DAY, Disp: 90 tablet, Rfl: 3 ?  Multiple Vitamins-Minerals (MULTIVITAMIN WITH MINERALS) tablet, Take 1 tablet by mouth daily., Disp: , Rfl:  ?  omeprazole (PRILOSEC) 40 MG capsule, TAKE 1 CAPSULE BY MOUTH EVERY DAY, Disp: 90 capsule, Rfl: 2 ?  pantoprazole (PROTONIX) 40 MG tablet, TAKE 1 TABLET BY MOUTH EVERY DAY, Disp: 90 tablet, Rfl: 3 ?  sevelamer carbonate (RENVELA) 2.4 g PACK, Take 2.4 g by mouth daily., Disp: , Rfl:  ?  simvastatin (ZOCOR) 20 MG tablet, TAKE 1 TABLET BY MOUTH EVERY DAY IN THE EVENING, Disp: 90 tablet, Rfl: 3 ?  sitaGLIPtin (JANUVIA) 50 MG tablet, Take 50 mg by mouth every evening. , Disp: , Rfl:   ? ?No Known Allergies ? ?ROS ?Review of Systems  ?Constitutional: Negative.   ?HENT: Negative.    ?Eyes: Negative.   ?Respiratory:  Positive for cough.   ?Cardiovascular: Negative.   ?Gastrointestinal: Negative.   ?Endocrine: Negative.   ?Genitourinary: Negative.   ?Musculoskeletal: Negative.   ?Skin: Negative.   ?Allergic/Immunologic: Negative.   ?Neurological: Negative.   ?Hematological: Negative.   ?Psychiatric/Behavioral: Negative.    ?All other systems reviewed and are negative. ? ?  ?Objective:  ?  ?Physical Exam ?Vitals reviewed.  ?Constitutional:   ?   Appearance: Normal appearance.  ?HENT:  ?   Mouth/Throat:  ?   Mouth: Mucous membranes are moist.  ?Eyes:  ?   Pupils: Pupils are equal, round, and reactive to light.  ?Neck:  ?   Vascular: No carotid bruit.  ?Cardiovascular:  ?   Rate and Rhythm: Normal rate and regular rhythm.  ?   Pulses: Normal pulses.  ?   Heart sounds: Normal heart sounds.  ?Pulmonary:  ?   Effort: Pulmonary effort is normal.  ?   Breath  sounds: Normal breath sounds.  ?Abdominal:  ?   General: Bowel sounds are normal.  ?   Palpations: Abdomen is soft. There is no hepatomegaly, splenomegaly or mass.  ?   Tenderness: There is no abdominal te

## 2021-08-18 NOTE — Assessment & Plan Note (Signed)
Patient has end-stage renal disease and he is on dialysis. ?

## 2021-08-20 DIAGNOSIS — N186 End stage renal disease: Secondary | ICD-10-CM | POA: Diagnosis not present

## 2021-08-20 DIAGNOSIS — N2581 Secondary hyperparathyroidism of renal origin: Secondary | ICD-10-CM | POA: Diagnosis not present

## 2021-08-20 DIAGNOSIS — Z992 Dependence on renal dialysis: Secondary | ICD-10-CM | POA: Diagnosis not present

## 2021-08-22 DIAGNOSIS — N2581 Secondary hyperparathyroidism of renal origin: Secondary | ICD-10-CM | POA: Diagnosis not present

## 2021-08-22 DIAGNOSIS — N186 End stage renal disease: Secondary | ICD-10-CM | POA: Diagnosis not present

## 2021-08-22 DIAGNOSIS — E1122 Type 2 diabetes mellitus with diabetic chronic kidney disease: Secondary | ICD-10-CM | POA: Diagnosis not present

## 2021-08-22 DIAGNOSIS — Z992 Dependence on renal dialysis: Secondary | ICD-10-CM | POA: Diagnosis not present

## 2021-08-25 DIAGNOSIS — Z992 Dependence on renal dialysis: Secondary | ICD-10-CM | POA: Diagnosis not present

## 2021-08-25 DIAGNOSIS — N2581 Secondary hyperparathyroidism of renal origin: Secondary | ICD-10-CM | POA: Diagnosis not present

## 2021-08-25 DIAGNOSIS — N186 End stage renal disease: Secondary | ICD-10-CM | POA: Diagnosis not present

## 2021-08-27 DIAGNOSIS — Z992 Dependence on renal dialysis: Secondary | ICD-10-CM | POA: Diagnosis not present

## 2021-08-27 DIAGNOSIS — N2581 Secondary hyperparathyroidism of renal origin: Secondary | ICD-10-CM | POA: Diagnosis not present

## 2021-08-27 DIAGNOSIS — N186 End stage renal disease: Secondary | ICD-10-CM | POA: Diagnosis not present

## 2021-08-29 DIAGNOSIS — N186 End stage renal disease: Secondary | ICD-10-CM | POA: Diagnosis not present

## 2021-08-29 DIAGNOSIS — Z992 Dependence on renal dialysis: Secondary | ICD-10-CM | POA: Diagnosis not present

## 2021-08-29 DIAGNOSIS — N2581 Secondary hyperparathyroidism of renal origin: Secondary | ICD-10-CM | POA: Diagnosis not present

## 2021-09-01 DIAGNOSIS — N2581 Secondary hyperparathyroidism of renal origin: Secondary | ICD-10-CM | POA: Diagnosis not present

## 2021-09-01 DIAGNOSIS — N186 End stage renal disease: Secondary | ICD-10-CM | POA: Diagnosis not present

## 2021-09-01 DIAGNOSIS — Z992 Dependence on renal dialysis: Secondary | ICD-10-CM | POA: Diagnosis not present

## 2021-09-03 DIAGNOSIS — N2581 Secondary hyperparathyroidism of renal origin: Secondary | ICD-10-CM | POA: Diagnosis not present

## 2021-09-03 DIAGNOSIS — Z992 Dependence on renal dialysis: Secondary | ICD-10-CM | POA: Diagnosis not present

## 2021-09-03 DIAGNOSIS — N186 End stage renal disease: Secondary | ICD-10-CM | POA: Diagnosis not present

## 2021-09-05 DIAGNOSIS — Z992 Dependence on renal dialysis: Secondary | ICD-10-CM | POA: Diagnosis not present

## 2021-09-05 DIAGNOSIS — N2581 Secondary hyperparathyroidism of renal origin: Secondary | ICD-10-CM | POA: Diagnosis not present

## 2021-09-05 DIAGNOSIS — N186 End stage renal disease: Secondary | ICD-10-CM | POA: Diagnosis not present

## 2021-09-08 DIAGNOSIS — N186 End stage renal disease: Secondary | ICD-10-CM | POA: Diagnosis not present

## 2021-09-08 DIAGNOSIS — Z992 Dependence on renal dialysis: Secondary | ICD-10-CM | POA: Diagnosis not present

## 2021-09-08 DIAGNOSIS — N2581 Secondary hyperparathyroidism of renal origin: Secondary | ICD-10-CM | POA: Diagnosis not present

## 2021-09-10 DIAGNOSIS — Z992 Dependence on renal dialysis: Secondary | ICD-10-CM | POA: Diagnosis not present

## 2021-09-10 DIAGNOSIS — N2581 Secondary hyperparathyroidism of renal origin: Secondary | ICD-10-CM | POA: Diagnosis not present

## 2021-09-10 DIAGNOSIS — N186 End stage renal disease: Secondary | ICD-10-CM | POA: Diagnosis not present

## 2021-09-12 DIAGNOSIS — Z992 Dependence on renal dialysis: Secondary | ICD-10-CM | POA: Diagnosis not present

## 2021-09-12 DIAGNOSIS — N2581 Secondary hyperparathyroidism of renal origin: Secondary | ICD-10-CM | POA: Diagnosis not present

## 2021-09-12 DIAGNOSIS — N186 End stage renal disease: Secondary | ICD-10-CM | POA: Diagnosis not present

## 2021-09-15 DIAGNOSIS — Z992 Dependence on renal dialysis: Secondary | ICD-10-CM | POA: Diagnosis not present

## 2021-09-15 DIAGNOSIS — N186 End stage renal disease: Secondary | ICD-10-CM | POA: Diagnosis not present

## 2021-09-15 DIAGNOSIS — N2581 Secondary hyperparathyroidism of renal origin: Secondary | ICD-10-CM | POA: Diagnosis not present

## 2021-09-17 DIAGNOSIS — Z992 Dependence on renal dialysis: Secondary | ICD-10-CM | POA: Diagnosis not present

## 2021-09-17 DIAGNOSIS — N2581 Secondary hyperparathyroidism of renal origin: Secondary | ICD-10-CM | POA: Diagnosis not present

## 2021-09-17 DIAGNOSIS — N186 End stage renal disease: Secondary | ICD-10-CM | POA: Diagnosis not present

## 2021-09-19 DIAGNOSIS — Z992 Dependence on renal dialysis: Secondary | ICD-10-CM | POA: Diagnosis not present

## 2021-09-19 DIAGNOSIS — N186 End stage renal disease: Secondary | ICD-10-CM | POA: Diagnosis not present

## 2021-09-19 DIAGNOSIS — N2581 Secondary hyperparathyroidism of renal origin: Secondary | ICD-10-CM | POA: Diagnosis not present

## 2021-09-21 DIAGNOSIS — E1122 Type 2 diabetes mellitus with diabetic chronic kidney disease: Secondary | ICD-10-CM | POA: Diagnosis not present

## 2021-09-21 DIAGNOSIS — Z992 Dependence on renal dialysis: Secondary | ICD-10-CM | POA: Diagnosis not present

## 2021-09-21 DIAGNOSIS — N186 End stage renal disease: Secondary | ICD-10-CM | POA: Diagnosis not present

## 2021-09-22 DIAGNOSIS — Z992 Dependence on renal dialysis: Secondary | ICD-10-CM | POA: Diagnosis not present

## 2021-09-22 DIAGNOSIS — N2581 Secondary hyperparathyroidism of renal origin: Secondary | ICD-10-CM | POA: Diagnosis not present

## 2021-09-22 DIAGNOSIS — N186 End stage renal disease: Secondary | ICD-10-CM | POA: Diagnosis not present

## 2021-09-24 DIAGNOSIS — Z992 Dependence on renal dialysis: Secondary | ICD-10-CM | POA: Diagnosis not present

## 2021-09-24 DIAGNOSIS — N2581 Secondary hyperparathyroidism of renal origin: Secondary | ICD-10-CM | POA: Diagnosis not present

## 2021-09-24 DIAGNOSIS — N186 End stage renal disease: Secondary | ICD-10-CM | POA: Diagnosis not present

## 2021-09-26 DIAGNOSIS — N2581 Secondary hyperparathyroidism of renal origin: Secondary | ICD-10-CM | POA: Diagnosis not present

## 2021-09-26 DIAGNOSIS — N186 End stage renal disease: Secondary | ICD-10-CM | POA: Diagnosis not present

## 2021-09-26 DIAGNOSIS — Z992 Dependence on renal dialysis: Secondary | ICD-10-CM | POA: Diagnosis not present

## 2021-09-29 DIAGNOSIS — N2581 Secondary hyperparathyroidism of renal origin: Secondary | ICD-10-CM | POA: Diagnosis not present

## 2021-09-29 DIAGNOSIS — Z992 Dependence on renal dialysis: Secondary | ICD-10-CM | POA: Diagnosis not present

## 2021-09-29 DIAGNOSIS — N186 End stage renal disease: Secondary | ICD-10-CM | POA: Diagnosis not present

## 2021-10-01 DIAGNOSIS — Z992 Dependence on renal dialysis: Secondary | ICD-10-CM | POA: Diagnosis not present

## 2021-10-01 DIAGNOSIS — N186 End stage renal disease: Secondary | ICD-10-CM | POA: Diagnosis not present

## 2021-10-01 DIAGNOSIS — N2581 Secondary hyperparathyroidism of renal origin: Secondary | ICD-10-CM | POA: Diagnosis not present

## 2021-10-03 DIAGNOSIS — N2581 Secondary hyperparathyroidism of renal origin: Secondary | ICD-10-CM | POA: Diagnosis not present

## 2021-10-03 DIAGNOSIS — N186 End stage renal disease: Secondary | ICD-10-CM | POA: Diagnosis not present

## 2021-10-03 DIAGNOSIS — Z992 Dependence on renal dialysis: Secondary | ICD-10-CM | POA: Diagnosis not present

## 2021-10-04 ENCOUNTER — Other Ambulatory Visit: Payer: Self-pay | Admitting: Internal Medicine

## 2021-10-06 DIAGNOSIS — Z992 Dependence on renal dialysis: Secondary | ICD-10-CM | POA: Diagnosis not present

## 2021-10-06 DIAGNOSIS — N2581 Secondary hyperparathyroidism of renal origin: Secondary | ICD-10-CM | POA: Diagnosis not present

## 2021-10-06 DIAGNOSIS — N186 End stage renal disease: Secondary | ICD-10-CM | POA: Diagnosis not present

## 2021-10-08 DIAGNOSIS — N186 End stage renal disease: Secondary | ICD-10-CM | POA: Diagnosis not present

## 2021-10-08 DIAGNOSIS — Z992 Dependence on renal dialysis: Secondary | ICD-10-CM | POA: Diagnosis not present

## 2021-10-08 DIAGNOSIS — N2581 Secondary hyperparathyroidism of renal origin: Secondary | ICD-10-CM | POA: Diagnosis not present

## 2021-10-10 DIAGNOSIS — Z992 Dependence on renal dialysis: Secondary | ICD-10-CM | POA: Diagnosis not present

## 2021-10-10 DIAGNOSIS — N186 End stage renal disease: Secondary | ICD-10-CM | POA: Diagnosis not present

## 2021-10-10 DIAGNOSIS — N2581 Secondary hyperparathyroidism of renal origin: Secondary | ICD-10-CM | POA: Diagnosis not present

## 2021-10-13 DIAGNOSIS — N2581 Secondary hyperparathyroidism of renal origin: Secondary | ICD-10-CM | POA: Diagnosis not present

## 2021-10-13 DIAGNOSIS — N186 End stage renal disease: Secondary | ICD-10-CM | POA: Diagnosis not present

## 2021-10-13 DIAGNOSIS — Z992 Dependence on renal dialysis: Secondary | ICD-10-CM | POA: Diagnosis not present

## 2021-10-15 DIAGNOSIS — Z992 Dependence on renal dialysis: Secondary | ICD-10-CM | POA: Diagnosis not present

## 2021-10-15 DIAGNOSIS — N2581 Secondary hyperparathyroidism of renal origin: Secondary | ICD-10-CM | POA: Diagnosis not present

## 2021-10-15 DIAGNOSIS — N186 End stage renal disease: Secondary | ICD-10-CM | POA: Diagnosis not present

## 2021-10-17 DIAGNOSIS — N186 End stage renal disease: Secondary | ICD-10-CM | POA: Diagnosis not present

## 2021-10-17 DIAGNOSIS — N2581 Secondary hyperparathyroidism of renal origin: Secondary | ICD-10-CM | POA: Diagnosis not present

## 2021-10-17 DIAGNOSIS — Z992 Dependence on renal dialysis: Secondary | ICD-10-CM | POA: Diagnosis not present

## 2021-10-20 DIAGNOSIS — N2581 Secondary hyperparathyroidism of renal origin: Secondary | ICD-10-CM | POA: Diagnosis not present

## 2021-10-20 DIAGNOSIS — N186 End stage renal disease: Secondary | ICD-10-CM | POA: Diagnosis not present

## 2021-10-20 DIAGNOSIS — Z992 Dependence on renal dialysis: Secondary | ICD-10-CM | POA: Diagnosis not present

## 2021-10-22 DIAGNOSIS — N186 End stage renal disease: Secondary | ICD-10-CM | POA: Diagnosis not present

## 2021-10-22 DIAGNOSIS — E1122 Type 2 diabetes mellitus with diabetic chronic kidney disease: Secondary | ICD-10-CM | POA: Diagnosis not present

## 2021-10-22 DIAGNOSIS — Z992 Dependence on renal dialysis: Secondary | ICD-10-CM | POA: Diagnosis not present

## 2021-10-22 DIAGNOSIS — N2581 Secondary hyperparathyroidism of renal origin: Secondary | ICD-10-CM | POA: Diagnosis not present

## 2021-10-24 DIAGNOSIS — Z992 Dependence on renal dialysis: Secondary | ICD-10-CM | POA: Diagnosis not present

## 2021-10-24 DIAGNOSIS — N186 End stage renal disease: Secondary | ICD-10-CM | POA: Diagnosis not present

## 2021-10-24 DIAGNOSIS — N2581 Secondary hyperparathyroidism of renal origin: Secondary | ICD-10-CM | POA: Diagnosis not present

## 2021-10-27 DIAGNOSIS — Z992 Dependence on renal dialysis: Secondary | ICD-10-CM | POA: Diagnosis not present

## 2021-10-27 DIAGNOSIS — N186 End stage renal disease: Secondary | ICD-10-CM | POA: Diagnosis not present

## 2021-10-27 DIAGNOSIS — N2581 Secondary hyperparathyroidism of renal origin: Secondary | ICD-10-CM | POA: Diagnosis not present

## 2021-10-29 DIAGNOSIS — Z992 Dependence on renal dialysis: Secondary | ICD-10-CM | POA: Diagnosis not present

## 2021-10-29 DIAGNOSIS — N186 End stage renal disease: Secondary | ICD-10-CM | POA: Diagnosis not present

## 2021-10-29 DIAGNOSIS — N2581 Secondary hyperparathyroidism of renal origin: Secondary | ICD-10-CM | POA: Diagnosis not present

## 2021-10-31 DIAGNOSIS — Z992 Dependence on renal dialysis: Secondary | ICD-10-CM | POA: Diagnosis not present

## 2021-10-31 DIAGNOSIS — N186 End stage renal disease: Secondary | ICD-10-CM | POA: Diagnosis not present

## 2021-10-31 DIAGNOSIS — N2581 Secondary hyperparathyroidism of renal origin: Secondary | ICD-10-CM | POA: Diagnosis not present

## 2021-11-03 DIAGNOSIS — Z992 Dependence on renal dialysis: Secondary | ICD-10-CM | POA: Diagnosis not present

## 2021-11-03 DIAGNOSIS — N2581 Secondary hyperparathyroidism of renal origin: Secondary | ICD-10-CM | POA: Diagnosis not present

## 2021-11-03 DIAGNOSIS — N186 End stage renal disease: Secondary | ICD-10-CM | POA: Diagnosis not present

## 2021-11-05 ENCOUNTER — Other Ambulatory Visit: Payer: Self-pay | Admitting: Internal Medicine

## 2021-11-05 DIAGNOSIS — N2581 Secondary hyperparathyroidism of renal origin: Secondary | ICD-10-CM | POA: Diagnosis not present

## 2021-11-05 DIAGNOSIS — N186 End stage renal disease: Secondary | ICD-10-CM | POA: Diagnosis not present

## 2021-11-05 DIAGNOSIS — Z992 Dependence on renal dialysis: Secondary | ICD-10-CM | POA: Diagnosis not present

## 2021-11-06 ENCOUNTER — Other Ambulatory Visit: Payer: Self-pay | Admitting: Internal Medicine

## 2021-11-07 DIAGNOSIS — N2581 Secondary hyperparathyroidism of renal origin: Secondary | ICD-10-CM | POA: Diagnosis not present

## 2021-11-07 DIAGNOSIS — N186 End stage renal disease: Secondary | ICD-10-CM | POA: Diagnosis not present

## 2021-11-07 DIAGNOSIS — Z992 Dependence on renal dialysis: Secondary | ICD-10-CM | POA: Diagnosis not present

## 2021-11-10 DIAGNOSIS — N2581 Secondary hyperparathyroidism of renal origin: Secondary | ICD-10-CM | POA: Diagnosis not present

## 2021-11-10 DIAGNOSIS — N186 End stage renal disease: Secondary | ICD-10-CM | POA: Diagnosis not present

## 2021-11-10 DIAGNOSIS — Z992 Dependence on renal dialysis: Secondary | ICD-10-CM | POA: Diagnosis not present

## 2021-11-12 DIAGNOSIS — Z992 Dependence on renal dialysis: Secondary | ICD-10-CM | POA: Diagnosis not present

## 2021-11-12 DIAGNOSIS — N2581 Secondary hyperparathyroidism of renal origin: Secondary | ICD-10-CM | POA: Diagnosis not present

## 2021-11-12 DIAGNOSIS — N186 End stage renal disease: Secondary | ICD-10-CM | POA: Diagnosis not present

## 2021-11-14 DIAGNOSIS — N186 End stage renal disease: Secondary | ICD-10-CM | POA: Diagnosis not present

## 2021-11-14 DIAGNOSIS — Z992 Dependence on renal dialysis: Secondary | ICD-10-CM | POA: Diagnosis not present

## 2021-11-14 DIAGNOSIS — N2581 Secondary hyperparathyroidism of renal origin: Secondary | ICD-10-CM | POA: Diagnosis not present

## 2021-11-17 DIAGNOSIS — N2581 Secondary hyperparathyroidism of renal origin: Secondary | ICD-10-CM | POA: Diagnosis not present

## 2021-11-17 DIAGNOSIS — Z992 Dependence on renal dialysis: Secondary | ICD-10-CM | POA: Diagnosis not present

## 2021-11-17 DIAGNOSIS — N186 End stage renal disease: Secondary | ICD-10-CM | POA: Diagnosis not present

## 2021-11-19 DIAGNOSIS — Z992 Dependence on renal dialysis: Secondary | ICD-10-CM | POA: Diagnosis not present

## 2021-11-19 DIAGNOSIS — N2581 Secondary hyperparathyroidism of renal origin: Secondary | ICD-10-CM | POA: Diagnosis not present

## 2021-11-19 DIAGNOSIS — N186 End stage renal disease: Secondary | ICD-10-CM | POA: Diagnosis not present

## 2021-11-21 DIAGNOSIS — E1122 Type 2 diabetes mellitus with diabetic chronic kidney disease: Secondary | ICD-10-CM | POA: Diagnosis not present

## 2021-11-21 DIAGNOSIS — N186 End stage renal disease: Secondary | ICD-10-CM | POA: Diagnosis not present

## 2021-11-21 DIAGNOSIS — Z992 Dependence on renal dialysis: Secondary | ICD-10-CM | POA: Diagnosis not present

## 2021-11-24 DIAGNOSIS — Z992 Dependence on renal dialysis: Secondary | ICD-10-CM | POA: Diagnosis not present

## 2021-11-24 DIAGNOSIS — N2581 Secondary hyperparathyroidism of renal origin: Secondary | ICD-10-CM | POA: Diagnosis not present

## 2021-11-24 DIAGNOSIS — N186 End stage renal disease: Secondary | ICD-10-CM | POA: Diagnosis not present

## 2021-11-26 DIAGNOSIS — N186 End stage renal disease: Secondary | ICD-10-CM | POA: Diagnosis not present

## 2021-11-26 DIAGNOSIS — Z992 Dependence on renal dialysis: Secondary | ICD-10-CM | POA: Diagnosis not present

## 2021-11-26 DIAGNOSIS — N2581 Secondary hyperparathyroidism of renal origin: Secondary | ICD-10-CM | POA: Diagnosis not present

## 2021-11-28 DIAGNOSIS — N2581 Secondary hyperparathyroidism of renal origin: Secondary | ICD-10-CM | POA: Diagnosis not present

## 2021-11-28 DIAGNOSIS — Z992 Dependence on renal dialysis: Secondary | ICD-10-CM | POA: Diagnosis not present

## 2021-11-28 DIAGNOSIS — N186 End stage renal disease: Secondary | ICD-10-CM | POA: Diagnosis not present

## 2021-12-01 DIAGNOSIS — N2581 Secondary hyperparathyroidism of renal origin: Secondary | ICD-10-CM | POA: Diagnosis not present

## 2021-12-01 DIAGNOSIS — N186 End stage renal disease: Secondary | ICD-10-CM | POA: Diagnosis not present

## 2021-12-01 DIAGNOSIS — Z992 Dependence on renal dialysis: Secondary | ICD-10-CM | POA: Diagnosis not present

## 2021-12-03 DIAGNOSIS — N186 End stage renal disease: Secondary | ICD-10-CM | POA: Diagnosis not present

## 2021-12-03 DIAGNOSIS — Z992 Dependence on renal dialysis: Secondary | ICD-10-CM | POA: Diagnosis not present

## 2021-12-03 DIAGNOSIS — N2581 Secondary hyperparathyroidism of renal origin: Secondary | ICD-10-CM | POA: Diagnosis not present

## 2021-12-05 DIAGNOSIS — N2581 Secondary hyperparathyroidism of renal origin: Secondary | ICD-10-CM | POA: Diagnosis not present

## 2021-12-05 DIAGNOSIS — Z992 Dependence on renal dialysis: Secondary | ICD-10-CM | POA: Diagnosis not present

## 2021-12-05 DIAGNOSIS — N186 End stage renal disease: Secondary | ICD-10-CM | POA: Diagnosis not present

## 2021-12-08 DIAGNOSIS — N2581 Secondary hyperparathyroidism of renal origin: Secondary | ICD-10-CM | POA: Diagnosis not present

## 2021-12-08 DIAGNOSIS — Z992 Dependence on renal dialysis: Secondary | ICD-10-CM | POA: Diagnosis not present

## 2021-12-08 DIAGNOSIS — N186 End stage renal disease: Secondary | ICD-10-CM | POA: Diagnosis not present

## 2021-12-10 DIAGNOSIS — Z992 Dependence on renal dialysis: Secondary | ICD-10-CM | POA: Diagnosis not present

## 2021-12-10 DIAGNOSIS — N2581 Secondary hyperparathyroidism of renal origin: Secondary | ICD-10-CM | POA: Diagnosis not present

## 2021-12-10 DIAGNOSIS — N186 End stage renal disease: Secondary | ICD-10-CM | POA: Diagnosis not present

## 2021-12-12 DIAGNOSIS — Z992 Dependence on renal dialysis: Secondary | ICD-10-CM | POA: Diagnosis not present

## 2021-12-12 DIAGNOSIS — N2581 Secondary hyperparathyroidism of renal origin: Secondary | ICD-10-CM | POA: Diagnosis not present

## 2021-12-12 DIAGNOSIS — N186 End stage renal disease: Secondary | ICD-10-CM | POA: Diagnosis not present

## 2021-12-15 DIAGNOSIS — N186 End stage renal disease: Secondary | ICD-10-CM | POA: Diagnosis not present

## 2021-12-15 DIAGNOSIS — Z992 Dependence on renal dialysis: Secondary | ICD-10-CM | POA: Diagnosis not present

## 2021-12-15 DIAGNOSIS — N2581 Secondary hyperparathyroidism of renal origin: Secondary | ICD-10-CM | POA: Diagnosis not present

## 2021-12-17 DIAGNOSIS — Z992 Dependence on renal dialysis: Secondary | ICD-10-CM | POA: Diagnosis not present

## 2021-12-17 DIAGNOSIS — N186 End stage renal disease: Secondary | ICD-10-CM | POA: Diagnosis not present

## 2021-12-17 DIAGNOSIS — N2581 Secondary hyperparathyroidism of renal origin: Secondary | ICD-10-CM | POA: Diagnosis not present

## 2021-12-18 ENCOUNTER — Other Ambulatory Visit: Payer: Self-pay

## 2021-12-18 NOTE — Patient Outreach (Signed)
[facility]) Care Management  12/18/2021  Joseph Mcpherson 30-May-1945 [id number]   Telephone Screen    Outreach call to patient to introduce [facility] services and assess care needs as part of benefit of PCP office and insurance plan. No answer. RN CM left HIPAA compliant voicemail message along with contact info.     Plan: RN CM will make outreach attempt to patient within 4 business days. RN CM will send unsuccessful outreach letter to patient.  Enzo Montgomery, RN,BSN,CCM [place] Management Telephonic Care Management Coordinator Direct Phone: [phone] Toll Free: [phone] Fax: [phone]

## 2021-12-19 DIAGNOSIS — N2581 Secondary hyperparathyroidism of renal origin: Secondary | ICD-10-CM | POA: Diagnosis not present

## 2021-12-19 DIAGNOSIS — N186 End stage renal disease: Secondary | ICD-10-CM | POA: Diagnosis not present

## 2021-12-19 DIAGNOSIS — Z992 Dependence on renal dialysis: Secondary | ICD-10-CM | POA: Diagnosis not present

## 2021-12-22 ENCOUNTER — Other Ambulatory Visit: Payer: Self-pay

## 2021-12-22 DIAGNOSIS — N186 End stage renal disease: Secondary | ICD-10-CM | POA: Diagnosis not present

## 2021-12-22 DIAGNOSIS — Z992 Dependence on renal dialysis: Secondary | ICD-10-CM | POA: Diagnosis not present

## 2021-12-22 DIAGNOSIS — E1122 Type 2 diabetes mellitus with diabetic chronic kidney disease: Secondary | ICD-10-CM | POA: Diagnosis not present

## 2021-12-22 DIAGNOSIS — N2581 Secondary hyperparathyroidism of renal origin: Secondary | ICD-10-CM | POA: Diagnosis not present

## 2021-12-22 NOTE — Patient Outreach (Signed)
[facility]) Care Management  12/22/2021  [place] Bro[place] 19-Sep-1945 [id number]   Telephone Screen       Outreach call to patient to introduce [facility] services and assess care needs as part of benefit of PCP office and insurance plan. No answer at main number listed. Outreach to emergency contact/spouse.Marland Kitchen Spoke with spouse who was very guarded with info. She did not wish to speak at length with RN. She reported patient was doing well an they were managing. Reviewed services and she declined. Encouraged closure of care gaps as noted in EMR.      Plan: RN CM will close case.  Enzo Montgomery, RN,BSN,CCM [place] Management Telephonic Care Management Coordinator Direct Phone: [phone] Toll Free: [phone] Fax: [phone]

## 2021-12-24 DIAGNOSIS — N2581 Secondary hyperparathyroidism of renal origin: Secondary | ICD-10-CM | POA: Diagnosis not present

## 2021-12-24 DIAGNOSIS — N186 End stage renal disease: Secondary | ICD-10-CM | POA: Diagnosis not present

## 2021-12-24 DIAGNOSIS — Z992 Dependence on renal dialysis: Secondary | ICD-10-CM | POA: Diagnosis not present

## 2021-12-26 DIAGNOSIS — N186 End stage renal disease: Secondary | ICD-10-CM | POA: Diagnosis not present

## 2021-12-26 DIAGNOSIS — Z992 Dependence on renal dialysis: Secondary | ICD-10-CM | POA: Diagnosis not present

## 2021-12-26 DIAGNOSIS — N2581 Secondary hyperparathyroidism of renal origin: Secondary | ICD-10-CM | POA: Diagnosis not present

## 2021-12-29 DIAGNOSIS — N2581 Secondary hyperparathyroidism of renal origin: Secondary | ICD-10-CM | POA: Diagnosis not present

## 2021-12-29 DIAGNOSIS — N186 End stage renal disease: Secondary | ICD-10-CM | POA: Diagnosis not present

## 2021-12-29 DIAGNOSIS — Z992 Dependence on renal dialysis: Secondary | ICD-10-CM | POA: Diagnosis not present

## 2021-12-31 DIAGNOSIS — Z992 Dependence on renal dialysis: Secondary | ICD-10-CM | POA: Diagnosis not present

## 2021-12-31 DIAGNOSIS — N2581 Secondary hyperparathyroidism of renal origin: Secondary | ICD-10-CM | POA: Diagnosis not present

## 2021-12-31 DIAGNOSIS — N186 End stage renal disease: Secondary | ICD-10-CM | POA: Diagnosis not present

## 2022-01-02 DIAGNOSIS — N2581 Secondary hyperparathyroidism of renal origin: Secondary | ICD-10-CM | POA: Diagnosis not present

## 2022-01-02 DIAGNOSIS — N186 End stage renal disease: Secondary | ICD-10-CM | POA: Diagnosis not present

## 2022-01-02 DIAGNOSIS — Z992 Dependence on renal dialysis: Secondary | ICD-10-CM | POA: Diagnosis not present

## 2022-01-05 DIAGNOSIS — N186 End stage renal disease: Secondary | ICD-10-CM | POA: Diagnosis not present

## 2022-01-05 DIAGNOSIS — Z992 Dependence on renal dialysis: Secondary | ICD-10-CM | POA: Diagnosis not present

## 2022-01-05 DIAGNOSIS — N2581 Secondary hyperparathyroidism of renal origin: Secondary | ICD-10-CM | POA: Diagnosis not present

## 2022-01-07 DIAGNOSIS — N186 End stage renal disease: Secondary | ICD-10-CM | POA: Diagnosis not present

## 2022-01-07 DIAGNOSIS — Z992 Dependence on renal dialysis: Secondary | ICD-10-CM | POA: Diagnosis not present

## 2022-01-07 DIAGNOSIS — N2581 Secondary hyperparathyroidism of renal origin: Secondary | ICD-10-CM | POA: Diagnosis not present

## 2022-01-09 DIAGNOSIS — Z992 Dependence on renal dialysis: Secondary | ICD-10-CM | POA: Diagnosis not present

## 2022-01-09 DIAGNOSIS — N2581 Secondary hyperparathyroidism of renal origin: Secondary | ICD-10-CM | POA: Diagnosis not present

## 2022-01-09 DIAGNOSIS — N186 End stage renal disease: Secondary | ICD-10-CM | POA: Diagnosis not present

## 2022-01-12 DIAGNOSIS — N2581 Secondary hyperparathyroidism of renal origin: Secondary | ICD-10-CM | POA: Diagnosis not present

## 2022-01-12 DIAGNOSIS — Z992 Dependence on renal dialysis: Secondary | ICD-10-CM | POA: Diagnosis not present

## 2022-01-12 DIAGNOSIS — N186 End stage renal disease: Secondary | ICD-10-CM | POA: Diagnosis not present

## 2022-01-14 DIAGNOSIS — Z992 Dependence on renal dialysis: Secondary | ICD-10-CM | POA: Diagnosis not present

## 2022-01-14 DIAGNOSIS — N2581 Secondary hyperparathyroidism of renal origin: Secondary | ICD-10-CM | POA: Diagnosis not present

## 2022-01-14 DIAGNOSIS — N186 End stage renal disease: Secondary | ICD-10-CM | POA: Diagnosis not present

## 2022-01-18 ENCOUNTER — Encounter: Payer: Self-pay | Admitting: Intensive Care

## 2022-01-18 ENCOUNTER — Inpatient Hospital Stay
Admission: EM | Admit: 2022-01-18 | Discharge: 2022-02-02 | DRG: 871 | Disposition: A | Discharge status: Home Health | Payer: Medicare HMO | Attending: Internal Medicine | Admitting: Internal Medicine

## 2022-01-18 ENCOUNTER — Inpatient Hospital Stay: Payer: Medicare HMO

## 2022-01-18 ENCOUNTER — Emergency Department: Payer: Medicare HMO

## 2022-01-18 ENCOUNTER — Other Ambulatory Visit: Payer: Self-pay

## 2022-01-18 DIAGNOSIS — N186 End stage renal disease: Secondary | ICD-10-CM

## 2022-01-18 DIAGNOSIS — Z83438 Family history of other disorder of lipoprotein metabolism and other lipidemia: Secondary | ICD-10-CM

## 2022-01-18 DIAGNOSIS — E119 Type 2 diabetes mellitus without complications: Secondary | ICD-10-CM

## 2022-01-18 DIAGNOSIS — D649 Anemia, unspecified: Secondary | ICD-10-CM | POA: Diagnosis not present

## 2022-01-18 DIAGNOSIS — R6520 Severe sepsis without septic shock: Secondary | ICD-10-CM | POA: Diagnosis present

## 2022-01-18 DIAGNOSIS — E8722 Chronic metabolic acidosis: Secondary | ICD-10-CM | POA: Diagnosis not present

## 2022-01-18 DIAGNOSIS — I10 Essential (primary) hypertension: Secondary | ICD-10-CM | POA: Diagnosis present

## 2022-01-18 DIAGNOSIS — D631 Anemia in chronic kidney disease: Secondary | ICD-10-CM | POA: Diagnosis not present

## 2022-01-18 DIAGNOSIS — E8770 Fluid overload, unspecified: Secondary | ICD-10-CM | POA: Diagnosis present

## 2022-01-18 DIAGNOSIS — Z79890 Hormone replacement therapy (postmenopausal): Secondary | ICD-10-CM

## 2022-01-18 DIAGNOSIS — A419 Sepsis, unspecified organism: Principal | ICD-10-CM | POA: Diagnosis present

## 2022-01-18 DIAGNOSIS — E1151 Type 2 diabetes mellitus with diabetic peripheral angiopathy without gangrene: Secondary | ICD-10-CM | POA: Diagnosis present

## 2022-01-18 DIAGNOSIS — R188 Other ascites: Secondary | ICD-10-CM | POA: Diagnosis not present

## 2022-01-18 DIAGNOSIS — Z452 Encounter for adjustment and management of vascular access device: Secondary | ICD-10-CM | POA: Diagnosis not present

## 2022-01-18 DIAGNOSIS — Z720 Tobacco use: Secondary | ICD-10-CM

## 2022-01-18 DIAGNOSIS — Z1624 Resistance to multiple antibiotics: Secondary | ICD-10-CM | POA: Diagnosis present

## 2022-01-18 DIAGNOSIS — M898X8 Other specified disorders of bone, other site: Secondary | ICD-10-CM | POA: Diagnosis present

## 2022-01-18 DIAGNOSIS — E782 Mixed hyperlipidemia: Secondary | ICD-10-CM | POA: Diagnosis present

## 2022-01-18 DIAGNOSIS — Z992 Dependence on renal dialysis: Secondary | ICD-10-CM

## 2022-01-18 DIAGNOSIS — Z833 Family history of diabetes mellitus: Secondary | ICD-10-CM

## 2022-01-18 DIAGNOSIS — N189 Chronic kidney disease, unspecified: Secondary | ICD-10-CM | POA: Diagnosis present

## 2022-01-18 DIAGNOSIS — E875 Hyperkalemia: Secondary | ICD-10-CM | POA: Diagnosis present

## 2022-01-18 DIAGNOSIS — R531 Weakness: Secondary | ICD-10-CM

## 2022-01-18 DIAGNOSIS — E871 Hypo-osmolality and hyponatremia: Secondary | ICD-10-CM | POA: Diagnosis present

## 2022-01-18 DIAGNOSIS — Z20822 Contact with and (suspected) exposure to covid-19: Secondary | ICD-10-CM | POA: Diagnosis not present

## 2022-01-18 DIAGNOSIS — K810 Acute cholecystitis: Secondary | ICD-10-CM

## 2022-01-18 DIAGNOSIS — G9341 Metabolic encephalopathy: Secondary | ICD-10-CM | POA: Diagnosis present

## 2022-01-18 DIAGNOSIS — I120 Hypertensive chronic kidney disease with stage 5 chronic kidney disease or end stage renal disease: Secondary | ICD-10-CM | POA: Diagnosis present

## 2022-01-18 DIAGNOSIS — E1122 Type 2 diabetes mellitus with diabetic chronic kidney disease: Secondary | ICD-10-CM | POA: Diagnosis present

## 2022-01-18 DIAGNOSIS — A498 Other bacterial infections of unspecified site: Secondary | ICD-10-CM | POA: Diagnosis not present

## 2022-01-18 DIAGNOSIS — E11649 Type 2 diabetes mellitus with hypoglycemia without coma: Secondary | ICD-10-CM | POA: Diagnosis not present

## 2022-01-18 DIAGNOSIS — Z8249 Family history of ischemic heart disease and other diseases of the circulatory system: Secondary | ICD-10-CM

## 2022-01-18 DIAGNOSIS — Z7984 Long term (current) use of oral hypoglycemic drugs: Secondary | ICD-10-CM

## 2022-01-18 DIAGNOSIS — N2581 Secondary hyperparathyroidism of renal origin: Secondary | ICD-10-CM | POA: Diagnosis present

## 2022-01-18 DIAGNOSIS — R509 Fever, unspecified: Principal | ICD-10-CM

## 2022-01-18 DIAGNOSIS — R066 Hiccough: Secondary | ICD-10-CM | POA: Diagnosis not present

## 2022-01-18 DIAGNOSIS — Z794 Long term (current) use of insulin: Secondary | ICD-10-CM

## 2022-01-18 DIAGNOSIS — Z79899 Other long term (current) drug therapy: Secondary | ICD-10-CM

## 2022-01-18 DIAGNOSIS — H9190 Unspecified hearing loss, unspecified ear: Secondary | ICD-10-CM | POA: Diagnosis present

## 2022-01-18 DIAGNOSIS — E039 Hypothyroidism, unspecified: Secondary | ICD-10-CM | POA: Diagnosis not present

## 2022-01-18 DIAGNOSIS — Z7982 Long term (current) use of aspirin: Secondary | ICD-10-CM

## 2022-01-18 DIAGNOSIS — N281 Cyst of kidney, acquired: Secondary | ICD-10-CM | POA: Diagnosis not present

## 2022-01-18 DIAGNOSIS — K219 Gastro-esophageal reflux disease without esophagitis: Secondary | ICD-10-CM | POA: Diagnosis present

## 2022-01-18 DIAGNOSIS — B9620 Unspecified Escherichia coli [E. coli] as the cause of diseases classified elsewhere: Secondary | ICD-10-CM | POA: Diagnosis present

## 2022-01-18 DIAGNOSIS — R4182 Altered mental status, unspecified: Secondary | ICD-10-CM | POA: Diagnosis not present

## 2022-01-18 DIAGNOSIS — K8000 Calculus of gallbladder with acute cholecystitis without obstruction: Secondary | ICD-10-CM | POA: Diagnosis not present

## 2022-01-18 DIAGNOSIS — F0280 Dementia in other diseases classified elsewhere without behavioral disturbance: Secondary | ICD-10-CM | POA: Diagnosis present

## 2022-01-18 DIAGNOSIS — Z1619 Resistance to other specified beta lactam antibiotics: Secondary | ICD-10-CM | POA: Diagnosis not present

## 2022-01-18 DIAGNOSIS — K828 Other specified diseases of gallbladder: Secondary | ICD-10-CM | POA: Diagnosis not present

## 2022-01-18 DIAGNOSIS — G301 Alzheimer's disease with late onset: Secondary | ICD-10-CM | POA: Diagnosis not present

## 2022-01-18 DIAGNOSIS — E1165 Type 2 diabetes mellitus with hyperglycemia: Secondary | ICD-10-CM | POA: Diagnosis present

## 2022-01-18 DIAGNOSIS — Z8673 Personal history of transient ischemic attack (TIA), and cerebral infarction without residual deficits: Secondary | ICD-10-CM

## 2022-01-18 DIAGNOSIS — N3289 Other specified disorders of bladder: Secondary | ICD-10-CM | POA: Diagnosis not present

## 2022-01-18 DIAGNOSIS — Z91158 Patient's noncompliance with renal dialysis for other reason: Secondary | ICD-10-CM

## 2022-01-18 DIAGNOSIS — R0689 Other abnormalities of breathing: Secondary | ICD-10-CM | POA: Diagnosis not present

## 2022-01-18 LAB — COMPREHENSIVE METABOLIC PANEL
ALT: 13 U/L (ref 0–44)
AST: 11 U/L — ABNORMAL LOW (ref 15–41)
Albumin: 3.5 g/dL (ref 3.5–5.0)
Alkaline Phosphatase: 106 U/L (ref 38–126)
Anion gap: 14 (ref 5–15)
BUN: 71 mg/dL — ABNORMAL HIGH (ref 8–23)
CO2: 20 mmol/L — ABNORMAL LOW (ref 22–32)
Calcium: 9.8 mg/dL (ref 8.9–10.3)
Chloride: 94 mmol/L — ABNORMAL LOW (ref 98–111)
Creatinine, Ser: 13.8 mg/dL — ABNORMAL HIGH (ref 0.61–1.24)
GFR, Estimated: 3 mL/min — ABNORMAL LOW (ref >60)
Glucose, Bld: 233 mg/dL — ABNORMAL HIGH (ref 70–99)
Potassium: 6.7 mmol/L (ref 3.5–5.1)
Sodium: 128 mmol/L — ABNORMAL LOW (ref 135–145)
Total Bilirubin: 1.5 mg/dL — ABNORMAL HIGH (ref 0.3–1.2)
Total Protein: 8.2 g/dL — ABNORMAL HIGH (ref 6.5–8.1)

## 2022-01-18 LAB — TSH: TSH: 0.088 u[IU]/mL — ABNORMAL LOW (ref 0.350–4.500)

## 2022-01-18 LAB — CBC WITH DIFFERENTIAL/PLATELET
Abs Immature Granulocytes: 0.58 10*3/uL — ABNORMAL HIGH (ref 0.00–0.07)
Basophils Absolute: 0.1 10*3/uL (ref 0.0–0.1)
Basophils Relative: 0 %
Eosinophils Absolute: 5.8 10*3/uL — ABNORMAL HIGH (ref 0.0–0.5)
Eosinophils Relative: 17 %
HCT: 38 % — ABNORMAL LOW (ref 39.0–52.0)
Hemoglobin: 12.1 g/dL — ABNORMAL LOW (ref 13.0–17.0)
Immature Granulocytes: 2 %
Lymphocytes Relative: 3 %
Lymphs Abs: 0.9 10*3/uL (ref 0.7–4.0)
MCH: 27.6 pg (ref 26.0–34.0)
MCHC: 31.8 g/dL (ref 30.0–36.0)
MCV: 86.8 fL (ref 80.0–100.0)
Monocytes Absolute: 2.4 10*3/uL — ABNORMAL HIGH (ref 0.1–1.0)
Monocytes Relative: 7 %
Neutro Abs: 24.9 10*3/uL — ABNORMAL HIGH (ref 1.7–7.7)
Neutrophils Relative %: 71 %
Platelets: 240 10*3/uL (ref 150–400)
RBC: 4.38 MIL/uL (ref 4.22–5.81)
RDW: 16.7 % — ABNORMAL HIGH (ref 11.5–15.5)
Smear Review: NORMAL
WBC: 34.6 10*3/uL — ABNORMAL HIGH (ref 4.0–10.5)
nRBC: 0 % (ref 0.0–0.2)

## 2022-01-18 LAB — CBG MONITORING, ED
Glucose-Capillary: 347 mg/dL — ABNORMAL HIGH (ref 70–99)
Glucose-Capillary: 232 mg/dL — ABNORMAL HIGH (ref 70–99)

## 2022-01-18 LAB — RESP PANEL BY RT-PCR (FLU A&B, COVID) ARPGX2
Influenza A by PCR: NEGATIVE
Influenza B by PCR: NEGATIVE
SARS Coronavirus 2 by RT PCR: NEGATIVE

## 2022-01-18 LAB — APTT: aPTT: 35 seconds (ref 24–36)

## 2022-01-18 LAB — HEMOGLOBIN A1C
Hgb A1c MFr Bld: 8.1 % — ABNORMAL HIGH (ref 4.8–5.6)
Mean Plasma Glucose: 185.77 mg/dL

## 2022-01-18 LAB — PROTIME-INR
INR: 1.2 (ref 0.8–1.2)
Prothrombin Time: 15.2 seconds (ref 11.4–15.2)

## 2022-01-18 LAB — HEPATITIS B SURFACE ANTIGEN: Hepatitis B Surface Ag: NONREACTIVE

## 2022-01-18 LAB — LACTIC ACID, PLASMA
Lactic Acid, Venous: 1 mmol/L (ref 0.5–1.9)
Lactic Acid, Venous: 1 mmol/L (ref 0.5–1.9)

## 2022-01-18 MED ORDER — SODIUM ZIRCONIUM CYCLOSILICATE 10 G PO PACK
10.0000 g | PACK | Freq: Once | ORAL | Status: AC
Start: 2022-01-18 — End: 2022-01-18
  Administered 2022-01-18: 10 g via ORAL
  Filled 2022-01-18: qty 1

## 2022-01-18 MED ORDER — PENTAFLUOROPROP-TETRAFLUOROETH EX AERO: 1.0000 | INHALATION_SPRAY | CUTANEOUS | Status: DC | PRN

## 2022-01-18 MED ORDER — VANCOMYCIN HCL 1500 MG/300ML IV SOLN
1500.0000 mg | Freq: Once | INTRAVENOUS | Status: AC
  Administered 2022-01-18: 1500 mg via INTRAVENOUS
  Filled 2022-01-18: qty 300

## 2022-01-18 MED ORDER — VANCOMYCIN HCL IN DEXTROSE 1-5 GM/200ML-% IV SOLN: 1000.0000 mg | Freq: Once | INTRAVENOUS | Status: DC

## 2022-01-18 MED ORDER — VANCOMYCIN HCL 1500 MG/300ML IV SOLN
1500.0000 mg | Freq: Once | INTRAVENOUS | Status: DC
  Filled 2022-01-18: qty 300

## 2022-01-18 MED ORDER — LOSARTAN POTASSIUM 25 MG PO TABS
25.0000 mg | ORAL_TABLET | Freq: Every evening | ORAL | Status: DC
  Administered 2022-01-18 – 2022-01-22 (×5): 25 mg via ORAL
  Filled 2022-01-18 (×5): qty 1

## 2022-01-18 MED ORDER — SODIUM CHLORIDE 0.9% FLUSH
3.0000 mL | Freq: Two times a day (BID) | INTRAVENOUS | Status: DC
  Administered 2022-01-18 – 2022-02-02 (×28): 3 mL via INTRAVENOUS

## 2022-01-18 MED ORDER — SIMVASTATIN 20 MG PO TABS
20.0000 mg | ORAL_TABLET | Freq: Every day | ORAL | Status: DC
  Administered 2022-01-19 – 2022-02-01 (×13): 20 mg via ORAL
  Filled 2022-01-18 (×13): qty 1

## 2022-01-18 MED ORDER — ALBUTEROL SULFATE (2.5 MG/3ML) 0.083% IN NEBU: 2.5000 mg | INHALATION_SOLUTION | Freq: Four times a day (QID) | RESPIRATORY_TRACT | Status: DC | PRN

## 2022-01-18 MED ORDER — ACETAMINOPHEN 325 MG PO TABS
650.0000 mg | ORAL_TABLET | Freq: Once | ORAL | Status: AC
Start: 2022-01-18 — End: 2022-01-18
  Administered 2022-01-18: 650 mg via ORAL

## 2022-01-18 MED ORDER — DEXTROSE 50 % IV SOLN
1.0000 | Freq: Once | INTRAVENOUS | Status: AC
  Administered 2022-01-18: 50 mL via INTRAVENOUS
  Filled 2022-01-18: qty 50

## 2022-01-18 MED ORDER — INSULIN ASPART 100 UNIT/ML IV SOLN
10.0000 [IU] | Freq: Once | INTRAVENOUS | Status: AC
  Administered 2022-01-18: 10 [IU] via INTRAVENOUS
  Filled 2022-01-18: qty 0.1

## 2022-01-18 MED ORDER — ACETAMINOPHEN 325 MG PO TABS
650.0000 mg | ORAL_TABLET | Freq: Four times a day (QID) | ORAL | Status: DC | PRN
  Administered 2022-01-21 – 2022-01-26 (×3): 650 mg via ORAL
  Filled 2022-01-18 (×3): qty 2

## 2022-01-18 MED ORDER — METOPROLOL SUCCINATE ER 50 MG PO TB24
50.0000 mg | ORAL_TABLET | Freq: Every day | ORAL | Status: DC
  Administered 2022-01-20 – 2022-02-02 (×13): 50 mg via ORAL
  Filled 2022-01-18 (×13): qty 1

## 2022-01-18 MED ORDER — DONEPEZIL HCL 5 MG PO TABS
10.0000 mg | ORAL_TABLET | Freq: Every day | ORAL | Status: DC
  Administered 2022-01-19 – 2022-02-02 (×14): 10 mg via ORAL
  Filled 2022-01-18 (×14): qty 2

## 2022-01-18 MED ORDER — SEVELAMER CARBONATE 2.4 G PO PACK
2.4000 g | PACK | Freq: Every day | ORAL | Status: DC
  Administered 2022-01-20 – 2022-01-27 (×7): 2.4 g via ORAL
  Filled 2022-01-18 (×9): qty 1

## 2022-01-18 MED ORDER — INSULIN ASPART 100 UNIT/ML IJ SOLN
0.0000 [IU] | Freq: Three times a day (TID) | INTRAMUSCULAR | Status: DC
  Administered 2022-01-19 – 2022-01-20 (×2): 2 [IU] via SUBCUTANEOUS
  Administered 2022-01-20: 1 [IU] via SUBCUTANEOUS
  Administered 2022-01-20: 3 [IU] via SUBCUTANEOUS
  Administered 2022-01-22: 5 [IU] via SUBCUTANEOUS
  Administered 2022-01-22: 2 [IU] via SUBCUTANEOUS
  Administered 2022-01-22 – 2022-01-23 (×2): 3 [IU] via SUBCUTANEOUS
  Filled 2022-01-18 (×9): qty 1

## 2022-01-18 MED ORDER — ALUM & MAG HYDROXIDE-SIMETH 200-200-20 MG/5ML PO SUSP
30.0000 mL | Freq: Four times a day (QID) | ORAL | Status: DC | PRN
  Administered 2022-01-18: 30 mL via ORAL
  Filled 2022-01-18: qty 30

## 2022-01-18 MED ORDER — GABAPENTIN 300 MG PO CAPS
300.0000 mg | ORAL_CAPSULE | Freq: Every day | ORAL | Status: DC
  Administered 2022-01-18 – 2022-02-01 (×15): 300 mg via ORAL
  Filled 2022-01-18 (×15): qty 1

## 2022-01-18 MED ORDER — HYDRALAZINE HCL 50 MG PO TABS
25.0000 mg | ORAL_TABLET | Freq: Two times a day (BID) | ORAL | Status: DC
  Administered 2022-01-18 – 2022-01-23 (×9): 25 mg via ORAL
  Filled 2022-01-18 (×9): qty 1

## 2022-01-18 MED ORDER — SODIUM CHLORIDE 0.9 % IV SOLN
1.0000 g | INTRAVENOUS | Status: DC
  Administered 2022-01-19 – 2022-01-20 (×3): 1 g via INTRAVENOUS
  Filled 2022-01-18 (×4): qty 10

## 2022-01-18 MED ORDER — ASPIRIN 81 MG PO TBEC
81.0000 mg | DELAYED_RELEASE_TABLET | Freq: Every day | ORAL | Status: DC
  Administered 2022-01-18 – 2022-02-02 (×14): 81 mg via ORAL
  Filled 2022-01-18 (×14): qty 1

## 2022-01-18 MED ORDER — INSULIN DETEMIR 100 UNIT/ML ~~LOC~~ SOLN
20.0000 [IU] | Freq: Every day | SUBCUTANEOUS | Status: DC
Start: 2022-01-18 — End: 2022-01-21
  Administered 2022-01-18 – 2022-01-20 (×3): 20 [IU] via SUBCUTANEOUS
  Filled 2022-01-18 (×3): qty 0.2

## 2022-01-18 MED ORDER — METOPROLOL TARTRATE 25 MG PO TABS
25.0000 mg | ORAL_TABLET | Freq: Once | ORAL | Status: AC
  Administered 2022-01-18: 25 mg via ORAL
  Filled 2022-01-18: qty 1

## 2022-01-18 MED ORDER — LORATADINE 10 MG PO TABS
10.0000 mg | ORAL_TABLET | Freq: Every evening | ORAL | Status: DC
  Administered 2022-01-18 – 2022-02-01 (×14): 10 mg via ORAL
  Filled 2022-01-18 (×14): qty 1

## 2022-01-18 MED ORDER — HEPARIN SODIUM (PORCINE) 1000 UNIT/ML DIALYSIS: 1000.0000 [IU] | INTRAMUSCULAR | Status: DC | PRN

## 2022-01-18 MED ORDER — HEPARIN SODIUM (PORCINE) 1000 UNIT/ML IJ SOLN
INTRAMUSCULAR | Status: AC
  Filled 2022-01-18: qty 10

## 2022-01-18 MED ORDER — ACETAMINOPHEN 650 MG RE SUPP: 650.0000 mg | Freq: Four times a day (QID) | RECTAL | Status: DC | PRN

## 2022-01-18 MED ORDER — SODIUM CHLORIDE 0.9 % IV SOLN
2.0000 g | Freq: Once | INTRAVENOUS | Status: AC
  Administered 2022-01-18: 2 g via INTRAVENOUS
  Filled 2022-01-18: qty 12.5

## 2022-01-18 MED ORDER — LEVOTHYROXINE SODIUM 137 MCG PO TABS
137.0000 ug | ORAL_TABLET | Freq: Every day | ORAL | Status: DC
  Administered 2022-01-19 – 2022-02-02 (×15): 137 ug via ORAL
  Filled 2022-01-18 (×15): qty 1

## 2022-01-18 MED ORDER — ACETAMINOPHEN 325 MG PO TABS
ORAL_TABLET | ORAL | Status: AC
  Filled 2022-01-18: qty 2

## 2022-01-18 MED ORDER — ALBUTEROL SULFATE (2.5 MG/3ML) 0.083% IN NEBU
10.0000 mg | INHALATION_SOLUTION | Freq: Once | RESPIRATORY_TRACT | Status: AC
  Administered 2022-01-18: 10 mg via RESPIRATORY_TRACT
  Filled 2022-01-18: qty 12

## 2022-01-18 MED ORDER — CALCIUM ACETATE (PHOS BINDER) 667 MG PO CAPS
667.0000 mg | ORAL_CAPSULE | Freq: Three times a day (TID) | ORAL | Status: DC
  Administered 2022-01-19 – 2022-01-20 (×4): 667 mg via ORAL
  Filled 2022-01-18 (×6): qty 1

## 2022-01-18 MED ORDER — ANTICOAGULANT SODIUM CITRATE 4% (200MG/5ML) IV SOLN
5.0000 mL | Status: DC | PRN
Start: 2022-01-18 — End: 2022-01-21

## 2022-01-18 MED ORDER — HEPARIN SODIUM (PORCINE) 5000 UNIT/ML IJ SOLN
5000.0000 [IU] | Freq: Three times a day (TID) | INTRAMUSCULAR | Status: DC
  Administered 2022-01-18 – 2022-01-21 (×7): 5000 [IU] via SUBCUTANEOUS
  Filled 2022-01-18 (×7): qty 1

## 2022-01-18 MED ORDER — ALTEPLASE 2 MG IJ SOLR: 2.0000 mg | Freq: Once | INTRAMUSCULAR | Status: DC | PRN

## 2022-01-18 MED ORDER — LIDOCAINE-PRILOCAINE 2.5-2.5 % EX CREA
1.0000 | TOPICAL_CREAM | CUTANEOUS | Status: DC | PRN
Start: 2022-01-18 — End: 2022-01-21

## 2022-01-18 MED ORDER — LIDOCAINE HCL (PF) 1 % IJ SOLN: 5.0000 mL | INTRAMUSCULAR | Status: DC | PRN

## 2022-01-18 MED ORDER — HEPARIN SODIUM (PORCINE) 1000 UNIT/ML DIALYSIS: 20.0000 [IU]/kg | INTRAMUSCULAR | Status: DC | PRN

## 2022-01-18 MED ORDER — CHLORHEXIDINE GLUCONATE CLOTH 2 % EX PADS
6.0000 | MEDICATED_PAD | Freq: Every day | CUTANEOUS | Status: DC
  Administered 2022-01-19 – 2022-02-01 (×14): 6 via TOPICAL
  Filled 2022-01-18: qty 6

## 2022-01-18 MED ORDER — CALCIUM GLUCONATE 10 % IV SOLN
1.0000 g | Freq: Once | INTRAVENOUS | Status: AC
  Administered 2022-01-18: 1 g via INTRAVENOUS
  Filled 2022-01-18: qty 10

## 2022-01-18 MED ORDER — PANTOPRAZOLE SODIUM 40 MG PO TBEC
40.0000 mg | DELAYED_RELEASE_TABLET | Freq: Every day | ORAL | Status: DC
  Administered 2022-01-19 – 2022-02-02 (×14): 40 mg via ORAL
  Filled 2022-01-18 (×14): qty 1

## 2022-01-18 NOTE — ED Provider Notes (Signed)
[facility] Provider Note    Event Date/Time   First MD Initiated Contact with Patient 01/18/22 1436     (approximate)   History   Weakness   HPI  Joseph Mcpherson is a 76 y.o. male with history of hypertension, ESRD on dialysis, diabetes, and hypothyroidism who presents with generalized weakness for the last 3 days associated with fever.  To feeling weak, the patient missed his dialysis on Friday, and so his last dialysis was 4 days ago on Wednesday.  He denies any chest pain or shortness of breath, vomiting or diarrhea, or dysuria, but has had some urinary incontinence at night.    Physical Exam   Triage Vital Signs: ED Triage Vitals  Enc Vitals Group     BP 01/18/22 1133 (!) 151/69     Pulse Rate 01/18/22 1133 74     Resp 01/18/22 1133 (!) 22     Temp 01/18/22 1133 (!) 101.4 F (38.6 C)     Temp Source 01/18/22 1133 Oral     SpO2 01/18/22 1133 94 %     Weight 01/18/22 1135 145 lb (65.8 kg)     Height 01/18/22 1135 '5\' 4"'$  (1.626 m)     Head Circumference --      Peak Flow --      Pain Score 01/18/22 1135 0     Pain Loc --      Pain Edu? --      Excl. in [place]? --     Most recent vital signs: Vitals:   01/18/22 1400 01/18/22 1430  BP: (!) 159/62 (!) 156/53  Pulse: 64 66  Resp: (!) 22 16  Temp:    SpO2: 100% 98%     General: Alert, no distress. CV:  Good peripheral perfusion.  Resp:  Normal effort.  Abd:  Soft and nontender.  No distention.  Other:  No peripheral edema.   ED Results / Procedures / Treatments   Labs (all labs ordered are listed, but only abnormal results are displayed) Labs Reviewed  COMPREHENSIVE METABOLIC PANEL - Abnormal; Notable for the following components:      Result Value   Sodium 128 (*)    Potassium 6.7 (*)    Chloride 94 (*)    CO2 20 (*)    Glucose, Bld 233 (*)    BUN 71 (*)    Creatinine, Ser 13.80 (*)    Total Protein 8.2 (*)    AST 11 (*)    Total Bilirubin 1.5 (*)    GFR, Estimated 3 (*)     All other components within normal limits  CBC WITH DIFFERENTIAL/PLATELET - Abnormal; Notable for the following components:   WBC 34.6 (*)    Hemoglobin 12.1 (*)    HCT 38.0 (*)    RDW 16.7 (*)    Neutro Abs 24.9 (*)    Monocytes Absolute 2.4 (*)    Eosinophils Absolute 5.8 (*)    Abs Immature Granulocytes 0.58 (*)    All other components within normal limits  CBG MONITORING, ED - Abnormal; Notable for the following components:   Glucose-Capillary 347 (*)    All other components within normal limits  RESP PANEL BY RT-PCR (FLU A&B, COVID) [id number]  CULTURE, BLOOD (ROUTINE X 2)  CULTURE, BLOOD (ROUTINE X 2)  URINE CULTURE  LACTIC ACID, PLASMA  LACTIC ACID, PLASMA  PROTIME-INR  APTT  URINALYSIS, COMPLETE (UACMP) WITH MICROSCOPIC  HEPATITIS B SURFACE ANTIGEN  HEPATITIS B SURFACE ANTIBODY, QUANTITATIVE  HEPATITIS B CORE ANTIBODY, IGM  HEPATITIS B CORE ANTIBODY, TOTAL     EKG  ED ECG REPORT I, Arta Silence, the attending physician, personally viewed and interpreted this ECG.  Date: 01/18/2022 EKG Time: 1134 Rate: 74 Rhythm: normal sinus rhythm QRS Axis: Right axis Intervals: normal ST/T Wave abnormalities: Peaked T waves Narrative Interpretation: Peaked T waves with no prolonged QRS or evidence of acute ischemia    RADIOLOGY  Chest x-ray: I independently viewed and interpreted the images; there is no focal consolidation or edema  PROCEDURES:  Critical Care performed: Yes, see critical care procedure note(s)  .Critical Care  Performed by: Arta Silence, MD Authorized by: Arta Silence, MD   Critical care provider statement:    Critical care time (minutes):  30   Critical care was necessary to treat or prevent imminent or life-threatening deterioration of the following conditions:  Metabolic crisis   Critical care was time spent personally by me on the following activities:  Development of treatment plan with patient or surrogate, discussions with  consultants, evaluation of patient's response to treatment, examination of patient, ordering and review of laboratory studies, ordering and review of radiographic studies, ordering and performing treatments and interventions, pulse oximetry, re-evaluation of patient's condition, review of old charts and obtaining history from patient or surrogate   Care discussed with: admitting provider      MEDICATIONS ORDERED IN ED: Medications  acetaminophen (TYLENOL) 325 MG tablet (  Not Given 01/18/22 1335)  Chlorhexidine Gluconate Cloth 2 % PADS 6 each (has no administration in time range)  vancomycin (VANCOREADY) IVPB 1500 mg/300 mL (has no administration in time range)  heparin injection 5,000 Units (has no administration in time range)  sodium chloride flush (NS) 0.9 % injection 3 mL (has no administration in time range)  acetaminophen (TYLENOL) tablet 650 mg (has no administration in time range)    Or  acetaminophen (TYLENOL) suppository 650 mg (has no administration in time range)  albuterol (PROVENTIL) (2.5 MG/3ML) 0.083% nebulizer solution 2.5 mg (has no administration in time range)  acetaminophen (TYLENOL) tablet 650 mg (650 mg Oral Given 01/18/22 1158)  sodium zirconium cyclosilicate (LOKELMA) packet 10 g (10 g Oral Given 01/18/22 1431)  calcium gluconate inj 10% (1 g) URGENT USE ONLY! (1 g Intravenous Given 01/18/22 1406)  albuterol (PROVENTIL) (2.5 MG/3ML) 0.083% nebulizer solution 10 mg (10 mg Nebulization Given 01/18/22 1428)  insulin aspart (novoLOG) injection 10 Units (10 Units Intravenous Given 01/18/22 1425)    And  dextrose 50 % solution 50 mL (50 mLs Intravenous Given 01/18/22 1425)  ceFEPIme (MAXIPIME) 2 g in sodium chloride 0.9 % 100 mL IVPB (0 g Intravenous Stopped 01/18/22 1503)     IMPRESSION / MDM / [place] / ED COURSE  I reviewed the triage vital signs and the nursing notes.  76 year old male with PMH as noted above presents with generalized weakness and fever for  the last 3 days with no focal symptoms.  He missed a session of dialysis and so was last dialyzed 4 days ago.  I reviewed the past medical records.  Per the hospitalist discharge summary from 05/06/2019 the patient was admitted at that time due to acute CVA.  He has not had any inpatient admissions since that time.  Differential diagnosis includes, but is not limited to, UTI, pneumonia, viral syndrome, other acute infection, metabolic disturbance, less likely cardiac cause.  Patient's presentation is most consistent with acute presentation with potential threat to life or bodily function.  The patient is on the cardiac monitor to evaluate for evidence of arrhythmia and/or significant heart rate changes.   Labs ordered from triage revealed hyperkalemia and significant leukocytosis.  EKG shows peaked T waves.  I ordered calcium gluconate, insulin and D50, albuterol, and Lokelma to treat hyperkalemia.  Based on the fever and elevated WBC count I ordered broad-spectrum antibiotics.  Lactate is normal.  The patient is hemodynamically stable.  ----------------------------------------- 3:59 PM on 01/18/2022 -----------------------------------------  I consulted Dr. Theador Hawthorne from nephrology to arrange for urgent dialysis.  I then consulted Dr. Tamala Julian from the hospitalist service; based on her discussion he agrees to admit the patient.   FINAL CLINICAL IMPRESSION(S) / ED DIAGNOSES   Final diagnoses:  Febrile illness  Hyperkalemia     Rx / DC Orders   ED Discharge Orders     None        Note:  This document was prepared using Dragon voice recognition software and may include unintentional dictation errors.    Arta Silence, MD 01/18/22 1600

## 2022-01-18 NOTE — Progress Notes (Signed)
Joseph Mcpherson  MRN: [id number]  DOB/AGE: [birth year] 76 y.o.  Primary Care Physician:Masoud, Viann Shove, MD  Admit date: 01/18/2022  Chief Complaint:  Chief Complaint  Patient presents with   Weakness    S-Pt presented on  01/18/2022 with  Chief Complaint  Patient presents with   Weakness  .   Patient is a 76 year old Asian male with a past medical history of hypertension, end-stage renal disease, on hemodialysis on Monday Wednesday Friday schedule, diabetes mellitus, CVA who was brought to the ER with chief complaint of weakness.  History of present illness date backs to past 3 to 4 days when patient started having weakness, it was generalized, progressively getting worse.  Patient is on Monday Wednesday Friday schedule.  Patient went to his dialysis unit on Friday but left without getting his dialysis due to weakness.    Patient was brought to the ER today as he was febrile and weak at home. Upon evaluation in the ER patient was found to have a temperature of 101.4 degrees and was tachypneic. Patient was found to have elevated white blood cell count at 34.6 and hyperkalemia with potassium of 6.7 Patient was admitted for further care. Nephrology was consulted for comanagement of dialysis patient Patient is known to our practice to previous admissions.  Patient was earlier admitted at [facility] in December 2020  Patient offers no complaint of chest pain/shortness of breath Patient offers no complaint of nausea/vomiting/diarrhea.  Patient wife was present in the room did give a history of decreased appetite. Patient is on renal replacement therapy as an outpatient at [facility] under Dr. Lenna Sciara. Napierala's care.   Medications  acetaminophen       [START ON 01/19/2022] Chlorhexidine Gluconate Cloth  6 each Topical Q0600   heparin  5,000 Units Subcutaneous Q8H   heparin sodium (porcine)       sodium chloride flush  3 mL Intravenous Q12H          YJE:HUDJS from the symptoms mentioned above,there are no other symptoms referable to all systems reviewed.  Physical Exam: Vital signs in last 24 hours: Temp:  [99 F (37.2 C)-101.4 F (38.6 C)] 99 F (37.2 C) (08/27 1617) Pulse Rate:  [64-78] 72 (08/27 1700) Resp:  [16-23] 16 (08/27 1700) BP: (112-177)/(53-76) 112/60 (08/27 1700) SpO2:  [94 %-100 %] 100 % (08/27 1700) Weight:  [65.8 kg] 65.8 kg (08/27 1135) Weight change:  Last BM Date : 01/18/22  Intake/Output from previous day: No intake/output data recorded. No intake/output data recorded.   Physical Exam:  General- Patient is lethargic but arousable, follows commands, responds appropriately  Resp- No acute REsp distress, CTA B/L NO Rhonchi  CVS- S1S2 regular in rate and rhythm  GIT- BS+, soft, Non tender , Non distended  EXT- No LE Edema,  No Cyanosis  Access- R-tunneled cath   Lab Results:  CBC  Recent Labs    01/18/22 1151  WBC 34.6*  HGB 12.1*  HCT 38.0*  PLT 240    BMET  Recent Labs    01/18/22 1151  NA 128*  K 6.7*  CL 94*  CO2 20*  GLUCOSE 233*  BUN 71*  CREATININE 13.80*  CALCIUM 9.8      Most recent Creatinine trend  Lab Results  Component Value Date   CREATININE 13.80 (H) 01/18/2022   CREATININE 7.50 (H) 12/10/2020   CREATININE 7.68 (H) 05/04/2019      MICRO   Recent Results (from the past 240 hour(s))  Resp Panel by RT-PCR (Flu A&B, Covid) Anterior Nasal Swab     Status: None   Collection Time: 01/18/22 11:51 AM   Specimen: Anterior Nasal Swab  Result Value Ref Range Status   SARS Coronavirus 2 by RT PCR NEGATIVE NEGATIVE Final    Comment: (NOTE) SARS-CoV-2 target nucleic acids are NOT DETECTED.  The SARS-CoV-2 RNA is generally detectable in upper respiratory specimens during the acute phase of infection. The lowest concentration of SARS-CoV-2 viral copies this assay can detect is 138 copies/mL. A negative result does not preclude  SARS-Cov-2 infection and should not be used as the sole basis for treatment or other patient management decisions. A negative result may occur with  improper specimen collection/handling, submission of specimen other than nasopharyngeal swab, presence of viral mutation(s) within the areas targeted by this assay, and inadequate number of viral copies(<138 copies/mL). A negative result must be combined with clinical observations, patient history, and epidemiological information. The expected result is Negative.  Fact Sheet for Patients:  EntrepreneurPulse.com.au  Fact Sheet for Healthcare Providers:  IncredibleEmployment.be  This test is no t yet approved or cleared by the [place] FDA and  has been authorized for detection and/or diagnosis of SARS-CoV-2 by FDA under an Emergency Use Authorization (EUA). This EUA will remain  in effect (meaning this test can be used) for the duration of the COVID-19 declaration under Section 564(b)(1) of the Act, 21 U.S.C.section 360bbb-3(b)(1), unless the authorization is terminated  or revoked sooner.       Influenza A by PCR NEGATIVE NEGATIVE Final   Influenza B by PCR NEGATIVE NEGATIVE Final    Comment: (NOTE) The Xpert Xpress SARS-CoV-2/FLU/RSV plus assay is intended as an aid in the diagnosis of influenza from Nasopharyngeal swab specimens and should not be used as a sole basis for treatment. Nasal washings and aspirates are unacceptable for Xpert Xpress SARS-CoV-2/FLU/RSV testing.  Fact Sheet for Patients: EntrepreneurPulse.com.au  Fact Sheet for Healthcare Providers: IncredibleEmployment.be  This test is not yet approved or cleared by the [place] FDA and has been authorized for detection and/or diagnosis of SARS-CoV-2 by FDA under an Emergency Use Authorization (EUA). This EUA will remain in effect (meaning this test can be used) for the duration of  the COVID-19 declaration under Section 564(b)(1) of the Act, 21 U.S.C. section 360bbb-3(b)(1), unless the authorization is terminated or revoked.  Performed at [facility], [address], [place], [place] [address]          Impression:    Outpatient treatment schedule ESRD d/t diabetic nephropathy, started HD 01/2018. Followed by Dr. Posey Pronto. Dialyzer:            180NRe Optiflux  Dialysate:           2.0 K, 2.5 Ca, 1.0 Mg, 100 Dextrose ([id number])   Prescribed Time:     3:30  Avg BFR:             350            Avg DFR:             800  EDW (kg):            69.70    1)Renal    End-stage renal disease Patient is on hemodialysis Patient is a Monday Wednesday Friday schedule. Patient missed his Friday dialysis Patient will be dialyzed emergently today as patient is hyperkalemic  2)HTN   Blood pressure on the higher side Dialysis treatment today should help  3)Anemia of chronic disease     Latest Ref Rng & Units 01/18/2022   11:51 AM 12/10/2020   12:13 PM 05/04/2019    9:35 PM  CBC  WBC 4.0 - 10.5 K/uL 34.6   11.8   Hemoglobin 13.0 - 17.0 g/dL 12.1  12.2  10.8   Hematocrit 39.0 - 52.0 % 38.0  36.0  33.2   Platelets 150 - 400 K/uL 240   236        HGb at goal (9--11)   4) Secondary hyperparathyroidism -CKD Mineral-Bone Disorder    Lab Results  Component Value Date   CALCIUM 9.8 01/18/2022   CAION 0.83 (LL) 12/10/2020   PHOS 2.6 09/13/2013    Patient is on phosphate binders calcium acetate as an outpatient  5)Sepsis Patient is tachypneic, elevated white blood cell count.  Patient is on broad-spectrum antibiotics Patient source of sepsis currently not clear Blood cultures have been drawn    6) Electrolytes      Latest Ref Rng & Units 01/18/2022   11:51 AM 12/10/2020   12:13 PM 05/04/2019    9:35 PM  BMP  Glucose 70 - 99 mg/dL 233  135    BUN 8 - 23 mg/dL 71  24    Creatinine 0.61 - 1.24 mg/dL 13.80  7.50  7.68   Sodium 135 - 145  mmol/L 128  132    Potassium 3.5 - 5.1 mmol/L 6.7  5.8    Chloride 98 - 111 mmol/L 94  102    CO2 22 - 32 mmol/L 20     Calcium 8.9 - 10.3 mg/dL 9.8        Sodium Hyponatremia Patient is hyponatremic secondary to ESRD Inability to get rid of free water Renal placement therapy should help   Potassium Hyperkalemia Patient is hyperkalemic secondary to ESRD There are issues with adherence We will dialyze patient emergently today We will follow    7)Chronic metabolic acidosis Patient bicarb is minimally on the lower side This is most likely secondary to ESRD Dialysis treatment should help   8)Diabetes mellitus Patient being followed by the primary team    Plan:   Patient will need emergent dialysis. I discussed the case with the patient, patient's spouse, ER team and patient's hospitalist team I later reached out to the dialysis nurse as patient had to be dialyzed emergently. Patient was given stabilizing measures with calcium gluconate, insulin D50, albuterol and Lokelma before the initiation replacement therapy.    I spent more than 45 minutes of critical care time  Addendum Patient was later seen on dialysis    Jesseka Drinkard s Jazmon Kos 01/18/2022, 5:25 PM

## 2022-01-18 NOTE — H&P (Addendum)
History and Physical    Patient: Joseph Mcpherson FGH:[id number] DOB: 06/20/1945 DOA: 01/18/2022 DOS: the patient was seen and examined on 01/18/2022 PCP: [name], MD  Patient coming from: Home  Chief Complaint:  Chief Complaint  Patient presents with   Weakness   HPI: Joseph Mcpherson is a 76 y.o. 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  History is mostly obtained from the patient's wife as he is lethargic and difficult to arouse.  Apparently he had been having fevers up to 101 F for which she had gone to dialysis 2 days ago, but was not dialyzed and returned home.  They have been giving him Tylenol for fever symptoms.  There is no reports of any cough, shortness of breath, nausea, vomiting, diarrhea, or dysuria symptoms.  He still makes small amount of urine at baseline.  One of the patient's family members had checked him and he was negative for COVID with an at-home test.  His wife notes that he had slept all day yesterday and was so weak he was unable to stand or ambulate without assistance.   He was noted to have some urinary incontinence prior to arrival.    Upon admission into the patient was noted to be febrile up to 101.4 F with tachypnea and vital signs otherwise stable.  Labs significant for WBC 34.6, hemoglobin 12.1, sodium 128, potassium 6.7, BUN 71, creatinine 13.8, glucose 233, total bilirubin 1.5, and lactic acid 1.  Chest x-ray noted double lumen right central line prominent cardiac silhouette without focal infiltrate appreciated.  Blood cultures were obtained and patient was given empiric antibiotics of vancomycin and cefepime.  Patient was also given 1 g of calcium gluconate, albuterol 10 mg neb, 10 units of insulin, amp of D50,  and Lokelma 10 g p.o..  While in the emergency department patient had complained of some burning in lower abdominal pain.  Review of  Systems: As mentioned in the history of present illness. All other systems reviewed and are negative. Past Medical History:  Diagnosis Date   Clotting disorder ([place])    per daughter, "when patient travels, his feet swell"   Diabetes mellitus without complication ([place])    type II   Diabetic eye exam ([place]) 12/2019   ESRD (end stage renal disease) ([place])    MWF- [facility]   GERD (gastroesophageal reflux disease)    [name] (hard of hearing)    Hyperlipidemia    Hypertension    Hypothyroidism    Thyroid disease    Past Surgical History:  Procedure Laterality Date   AV FISTULA PLACEMENT Right 10/22/2017   Procedure: ARTERIOVENOUS (AV) BRACHIOCEPHALIC FISTULA CREATION RIGHT UPPER ARM;  Surgeon: Conrad [place], MD;  Location: [facility];  Service: Vascular;  Laterality: Right;   AV FISTULA PLACEMENT Left 02/03/2019   Procedure: INSERTION OF ARTERIOVENOUS (AV) GORE-TEX GRAFT ARM (BRACHIAL AXILLARY );  Surgeon: [name], MD;  Location: [facility];  Service: Vascular;  Laterality: Left;   AV FISTULA PLACEMENT Left 12/10/2020   Procedure: LEFT ARM BRACHIOCEPHALIC ARTERIOVENOUS (AV) FISTULA CREATION;  Surgeon: [name], MD;  Location: [facility];  Service: Vascular;  Laterality: Left;   UPPER EXTREMITY ANGIOGRAPHY Left 09/19/2019   Procedure: UPPER EXTREMITY ANGIOGRAPHY;  Surgeon: [name], MD;  Location: [facility];  Service: Cardiovascular;  Laterality: Left;   Social History:  reports that he has never  smoked. His smokeless tobacco use includes snuff. He reports that he does not drink alcohol and does not use drugs.  No Known Allergies  Family History  Problem Relation Age of Onset   Diabetes Mother    Hyperlipidemia Mother    Hypertension Father     Prior to Admission medications   Medication Sig Start Date End Date Taking? Authorizing Provider  albuterol (PROVENTIL) (2.5 MG/3ML) 0.083% nebulizer solution Take 3 mLs (2.5 mg total) by nebulization every 6 (six) hours  as needed for wheezing or shortness of breath. 10/28/20   [name], MD  aspirin EC 325 MG EC tablet Take 1 tablet (325 mg total) by mouth daily. 05/06/19   Dhungel, Flonnie Overman, MD  aspirin EC 81 MG tablet Take 81 mg by mouth daily. Swallow whole.    [provider]  B Complex-C-Folic Acid (RENA-VITE RX) 1 MG TABS Take 1 tablet by mouth daily.  12/07/18   [provider]  BD PEN NEEDLE NANO 2ND GEN 32G X 4 MM MISC USE AS DIRECTED 02/24/21   [name], MD  benzonatate (TESSALON) 100 MG capsule Take 1 capsule (100 mg total) by mouth 2 (two) times daily as needed for cough. 08/15/21   Theresia Lo, NP  calcium acetate (PHOSLO) 667 MG capsule TAKE 2 CAPSULES BY MOUTH 3 TIMES A DAY WITH MEAL 05/19/21   [name], MD  Cholecalciferol (VITAMIN D3) 5000 units TABS Take 5,000 Units by mouth daily.     [provider]  Continuous Blood Gluc Sensor (FREESTYLE LIBRE 14 DAY SENSOR) MISC APPLY EVERY 14 (FOURTEEN) DAYS. 11/07/21   [name], MD  docusate sodium (COLACE) 100 MG capsule TAKE 1 CAPSULE BY MOUTH TWICE A DAY 10/06/21   [name], MD  donepezil (ARICEPT) 10 MG tablet TAKE 1 TABLET BY MOUTH EVERY DAY 04/22/21   [name], MD  gabapentin (NEURONTIN) 300 MG capsule TAKE 1 CAPSULE BY MOUTH EVERY DAY AT BEDTIME AS DIRECTED 11/05/21   [name], MD  hydrALAZINE (APRESOLINE) 50 MG tablet Take 1 tablet (50 mg total) by mouth 3 (three) times daily. Patient taking differently: Take 25 mg by mouth in the morning and at bedtime. 10/16/19   [name], MD  HYDROcodone-acetaminophen (NORCO) 5-325 MG tablet Take 1 tablet by mouth every 6 (six) hours as needed for moderate pain. 12/10/20   Dagoberto Ligas, PA-C  insulin aspart (NOVOLOG) 100 UNIT/ML FlexPen Inject 10 Units into the skin at bedtime.    [provider]  insulin detemir (LEVEMIR) 100 UNIT/ML injection Inject 20 Units into the skin at bedtime.    [provider]  levocetirizine (XYZAL) 5  MG tablet TAKE 1 TABLET BY MOUTH EVERY DAY IN THE EVENING 01/07/21   [name], MD  levothyroxine (SYNTHROID) 137 MCG tablet TAKE 1 TABLET BY MOUTH EVERY DAY 03/24/21   [name], MD  losartan (COZAAR) 25 MG tablet TAKE 1 TABLET BY MOUTH EVERY EVENING 04/22/21   [name], MD  metoprolol succinate (TOPROL-XL) 50 MG 24 hr tablet TAKE 1 TABLET BY MOUTH EVERY DAY 04/22/21   [name], MD  Multiple Vitamins-Minerals (MULTIVITAMIN WITH MINERALS) tablet Take 1 tablet by mouth daily.    [provider]  omeprazole (PRILOSEC) 40 MG capsule TAKE 1 CAPSULE BY MOUTH EVERY DAY 08/14/20   [name], MD  pantoprazole (PROTONIX) 40 MG tablet TAKE 1 TABLET BY MOUTH EVERY DAY 07/09/21   [name], MD  sevelamer carbonate (RENVELA) 2.4 g PACK Take 2.4 g by  mouth daily. 04/26/19   [provider]  simvastatin (ZOCOR) 20 MG tablet TAKE 1 TABLET BY MOUTH EVERY DAY IN THE EVENING 03/03/21   [name], MD  sitaGLIPtin (JANUVIA) 50 MG tablet Take 50 mg by mouth every evening.     [provider]    Physical Exam: Vitals:   01/18/22 1133 01/18/22 1135 01/18/22 1356  BP: (!) 151/69    Pulse: 74    Resp: (!) 22    Temp: (!) 101.4 F (38.6 C)  99.2 F (37.3 C)  TempSrc: Oral  Oral  SpO2: 94%    Weight:  65.8 kg   Height:  '5\' 4"'$  (1.626 m)    Exam  Constitutional: Elderly male appears ill and lethargic. Eyes: PERRL, lids and conjunctivae normal ENMT: Mucous membranes are moist.    Neck: normal, supple  Respiratory: clear to auscultation bilaterally, no wheezing, no crackles.   Cardiovascular: Regular rate and rhythm.  Hemodialysis catheter of the right chest wall.  No extremity edema.  Fistula present of the left upper extremity without palpable thrill. Abdomen: no tenderness, no masses palpated.  Bowel sounds positive.  Musculoskeletal: no clubbing / cyanosis. No joint deformity upper and lower extremities. Good ROM, no contractures. Normal muscle tone.   Skin: no rashes, lesions, ulcers. No induration Neurologic: CN 2-12 grossly intact. Sensation intact, DTR normal. Strength 5/5 in all 4.  Psychiatric: Normal judgment and insight. Alert and oriented x 3. Normal mood.   Data Reviewed:  EKG reveals sinus rhythm at 74 bpm with rightward axis and signs of T wave peaking.  Reviewed labs, imaging, and pertinent records as noted above in HPI.  Assessment and Plan: Sepsis, unknown source On admission patient noted to be febrile up to 101.4 F with mild tachypnea, and labs significant for WBC 34.6 with lactic acid reassuring at 1.  Chest x-ray showed prominent cardiac silhouette without  acute abnormality.  No clear source noted at this time. -Admit to a progressive bed -Follow-up blood and urine culture  -Continue vancomycin and cefepime -Continue Tylenol as needed for fever  Hyperkalemia Acute.  Initial potassium 6.7 with signs of T wave peaking noted on EKG.  Patient has been given Lokelma 10g p.o., albuterol 10 mg neb, calcium gluconate 1 g IV, amp of D50, and 10 units of insulin. -Continue to monitor and treat accordingly  ESRD on HD Patient normally dialyzes Monday, Wednesday, Friday, but missed dialysis Friday due to not feeling well.   -HD per nephrology -Appreciate nephrology consultative services, any further recommendations  Weakness Acute.  Patient noted to be grossly weak needing assistance to ambulate. -Neurochecks -Check CT scan of the brain   -PT/OT to evaluate and treat -May warrant further work-up if medically warranted  Essential hypertension Home blood pressure medication regimen includes hydralazine 25 mg twice daily, losartan 25 mg daily, and metoprolol 50 mg daily. -Continue current medication regimen  Hyponatremia Acute on chronic.  Sodium 128 on admission.  Suspect patient likely at least partially fluid overloaded from recent missed dialysis. -Continue to monitor   Anemia chronic kidney disease Hemoglobin  12.9 g/dL.   -Continue to monitor    Alzheimer's disease -Delirium precautions -Continue donepezil  Insulin-dependent diabetes mellitus type 2 Home medication regimen includes Januvia 50 mg daily, NovoLog 10 units subcu nightly, and Levemir 20 units nightly. -Hypoglycemic protocol -Continue Levemir 20 units nightly -CBGs before every meal with sensitive SSI -Adjust regimen as needed  Hypothyroidism -Check TSH -Continue levothyroxine and adjust dosage as needed  Mixed hyperlipidemia Home medication regimen includes simvastatin 20 mg daily. -Continue statin  History of CVA -Continue statin and aspirin  GERD -Continue Protonix  DVT prophylaxis: Heparin Advance Care Planning:   Code Status: Full Code  Consults: Nephrology  Family Communication: Wife updated at bedside  Severity of Illness: The appropriate patient status for this patient is INPATIENT. Inpatient status is judged to be reasonable and necessary in order to provide the required intensity of service to ensure the patient's safety. The patient's presenting symptoms, physical exam findings, and initial radiographic and laboratory data in the context of their chronic comorbidities is felt to place them at high risk for further clinical deterioration. Furthermore, it is not anticipated that the patient will be medically stable for discharge from the hospital within 2 midnights of admission.   * I certify that at the point of admission it is my clinical judgment that the patient will require inpatient hospital care spanning beyond 2 midnights from the point of admission due to high intensity of service, high risk for further deterioration and high frequency of surveillance required.*  Author: Norval Morton, MD 01/18/2022 2:31 PM  For on call review [email or web address].

## 2022-01-18 NOTE — Progress Notes (Addendum)
Pharmacy Antibiotic Note  Joseph Mcpherson is a 76 y.o. male admitted on 01/18/2022 with sepsis. Past medical history includes ESRD on HD MWF, T2DM. Pharmacy has been consulted for vancomycin and cefepime dosing.  Cefepime 2 grams x 1 given prior to HD. Then, received 3 hours of HD.  Plan: Give vancomycin 1500 mg post-HD. Then, once HD plan clear, start maintenance vancomycin 750 mg QHD post-HD doses.   Cefepime 1 gram every 24 hours  Monitor nephrology plans for dialysis schedule, cultures, MRSA PCR result, and de-escalation.  Height: '5\' 4"'$  (162.6 cm) Weight: 65.8 kg (145 lb) IBW/kg (Calculated) : 59.2  Temp (24hrs), Avg:100.3 F (37.9 C), Min:99.2 F (37.3 C), Max:101.4 F (38.6 C)  Recent Labs  Lab 01/18/22 1151 01/18/22 1456  WBC 34.6*  --   CREATININE 13.80*  --   LATICACIDVEN 1.0 1.0    Estimated Creatinine Clearance: 3.8 mL/min (A) (by C-G formula based on SCr of 13.8 mg/dL (H)).    No Known Allergies  Antimicrobials this admission: cefepime 8/27 >>  vancomycin 8/27 >>   Dose adjustments this admission: N/a  Microbiology results: 8/27 BCx: in process 8/27 Ucx (In and out cath): to be collected  8/27 MRSA PCR: ordered for 8/28 AM  Thank you for allowing pharmacy to be a part of this patient's care.  Glean Salvo, PharmD Clinical Pharmacist  01/18/2022 5:41 PM

## 2022-01-18 NOTE — ED Triage Notes (Addendum)
Pt in via EMS from home. EMS reports pt has not been feeling well since Friday. Pt missed his dialysis appt Friday. Pt also with fever since Friday. Pt also urinated on self and generally weak. CBG 214

## 2022-01-18 NOTE — Progress Notes (Signed)
Hd tx of 3hrs completed. 69L total vol processed. No complications. Report given to kendall, rn. ER-33. Total uf removed: 2012m Post hd v/s: 98.1 133/83(97) 73 18 100%

## 2022-01-18 NOTE — ED Provider Triage Note (Addendum)
Emergency Medicine Provider Triage Evaluation Note  Joseph Mcpherson, a 76 y.o. male  was evaluated in triage.  Pt complains of fever since Friday.  Patient who presents with his wife via EMS from home, presents with several days of fatigue, poor oral intake, and subjective intermittent fevers.  Patient missed dialysis on Friday secondary to his symptom onset.  His wife knows urinary incontinence and altered mental status.  Review of Systems  Positive: Fevers, AMS Negative: NVD  Physical Exam  BP (!) 151/69 (BP Location: Left Arm)   Pulse 74   Temp (!) 101.4 F (38.6 C) (Oral)   Resp (!) 22   Ht '5\' 4"'$  (1.626 m)   Wt 65.8 kg   SpO2 94%   BMI 24.89 kg/m  Gen:   Awake, no distress   Resp:  Normal effort  MSK:   Moves extremities without difficulty  CVS:  RRR  Medical Decision Making  Medically screening exam initiated at 11:42 AM.  Appropriate orders placed.  Kayce Betty was informed that the remainder of the evaluation will be completed by another provider, this initial triage assessment does not replace that evaluation, and the importance of remaining in the ED until their evaluation is complete.  Patient with history of hypertension, and ESRD on MWF dialysis, presents to the ED with onset of fever since Friday.  Patient has had poor oral intake, extreme malaise, and altered mental status.  Patient presents with vital signs and history concerning for evolving sepsis.  Sepsis protocol was initiated in triage.   Melvenia Needles, PA-C 01/18/22 1147    Melvenia Needles, [place] 01/18/22 1148

## 2022-01-18 NOTE — Progress Notes (Signed)
PHARMACY -  BRIEF ANTIBIOTIC NOTE   Pharmacy has received consult(s) for vancomycin and cefepime from an ED provider.  The patient's profile has been reviewed for ht/wt/allergies/indication/available labs.    One time order(s) placed for vancomycin 1500 mg x 1 and cefepime 2 grams x 1  Further antibiotics/pharmacy consults should be ordered by admitting physician if indicated.                       Thank you,  Glean Salvo, PharmD Clinical Pharmacist  01/18/2022 2:13 PM

## 2022-01-19 DIAGNOSIS — A419 Sepsis, unspecified organism: Secondary | ICD-10-CM | POA: Diagnosis not present

## 2022-01-19 LAB — RENAL FUNCTION PANEL
Albumin: 3.1 g/dL — ABNORMAL LOW (ref 3.5–5.0)
Anion gap: 14 (ref 5–15)
BUN: 46 mg/dL — ABNORMAL HIGH (ref 8–23)
CO2: 27 mmol/L (ref 22–32)
Calcium: 8.8 mg/dL — ABNORMAL LOW (ref 8.9–10.3)
Chloride: 93 mmol/L — ABNORMAL LOW (ref 98–111)
Creatinine, Ser: 9.09 mg/dL — ABNORMAL HIGH (ref 0.61–1.24)
GFR, Estimated: 6 mL/min — ABNORMAL LOW (ref >60)
Glucose, Bld: 214 mg/dL — ABNORMAL HIGH (ref 70–99)
Phosphorus: 4.3 mg/dL (ref 2.5–4.6)
Potassium: 4.3 mmol/L (ref 3.5–5.1)
Sodium: 134 mmol/L — ABNORMAL LOW (ref 135–145)

## 2022-01-19 LAB — CBC
HCT: 33.9 % — ABNORMAL LOW (ref 39.0–52.0)
Hemoglobin: 10.7 g/dL — ABNORMAL LOW (ref 13.0–17.0)
MCH: 27.2 pg (ref 26.0–34.0)
MCHC: 31.6 g/dL (ref 30.0–36.0)
MCV: 86 fL (ref 80.0–100.0)
Platelets: 211 10*3/uL (ref 150–400)
RBC: 3.94 MIL/uL — ABNORMAL LOW (ref 4.22–5.81)
RDW: 16.7 % — ABNORMAL HIGH (ref 11.5–15.5)
WBC: 22.8 10*3/uL — ABNORMAL HIGH (ref 4.0–10.5)
nRBC: 0 % (ref 0.0–0.2)

## 2022-01-19 LAB — CBG MONITORING, ED
Glucose-Capillary: 168 mg/dL — ABNORMAL HIGH (ref 70–99)
Glucose-Capillary: 168 mg/dL — ABNORMAL HIGH (ref 70–99)

## 2022-01-19 LAB — GLUCOSE, CAPILLARY
Glucose-Capillary: 106 mg/dL — ABNORMAL HIGH (ref 70–99)
Glucose-Capillary: 175 mg/dL — ABNORMAL HIGH (ref 70–99)

## 2022-01-19 LAB — HEPATITIS B CORE ANTIBODY, IGM: Hep B C IgM: NONREACTIVE

## 2022-01-19 LAB — HEPATITIS B CORE ANTIBODY, TOTAL: Hep B Core Total Ab: REACTIVE — AB

## 2022-01-19 MED ORDER — VANCOMYCIN VARIABLE DOSE PER UNSTABLE RENAL FUNCTION (PHARMACIST DOSING): Status: DC

## 2022-01-19 MED ORDER — VANCOMYCIN HCL 750 MG/150ML IV SOLN
750.0000 mg | INTRAVENOUS | Status: DC
  Administered 2022-01-19: 750 mg via INTRAVENOUS
  Filled 2022-01-19: qty 150

## 2022-01-19 NOTE — Progress Notes (Signed)
Hd tx of 3.5hrs completed. 84L total vol processed. No complications. Report given to [facility], rn.  Total uf removed: 2070m Post hd wt: 68.8kgs Post hd v/s: 97.8 128/74(87) 72 18 100%

## 2022-01-19 NOTE — Evaluation (Signed)
Physical Therapy Evaluation Patient Details Name: Joseph Mcpherson MRN: [id number] DOB: [birth year] Today's Date: 01/19/2022  History of Present Illness  Joseph Mcpherson is a 76 y.o. 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  History is mostly obtained from the patient's wife as he is lethargic and difficult to arouse.   Clinical Impression   Pt admitted with above diagnosis. Pt received upright in bed agreeable to PT. Per son in room pt is [facility]. At baseline is independent with mobility and ADL's/IADL's. To date, pt able to exit bed with increased time and bed features without physical assist. Fair seated balance noted with ability to stand minguard and doff boxers and don gown with PT assist. Able to ambulate ~60' in hallway without AD and minguard. Variable step lengths noted due to unsteadiness but no needed external support from PT needed for correcting mild balance deficits. Pt seated EoB with consistent posterior lean maintaining difficulty to return to neutral seated posture. Pt returned to upright in bed with all needs in reach. Pt currently with functional limitations due to the deficits listed below (see PT Problem List). Pt will benefit from skilled PT to increase their independence and safety with mobility to allow discharge to the venue listed below.       Recommendations for follow up therapy are one component of a multi-disciplinary discharge planning process, led by the attending physician.  Recommendations may be updated based on patient status, additional functional criteria and insurance authorization.  Follow Up Recommendations Home health PT      Assistance Recommended at Discharge Frequent or constant Supervision/Assistance  Patient can return home with the following  A little help with walking and/or transfers;A little help with bathing/dressing/bathroom;Assistance  with cooking/housework;Assist for transportation;Help with stairs or ramp for entrance    Equipment Recommendations Rolling walker (2 wheels)  Recommendations for Other Services       Functional Status Assessment Patient has had a recent decline in their functional status and demonstrates the ability to make significant improvements in function in a reasonable and predictable amount of time.     Precautions / Restrictions Precautions Precautions: Fall Restrictions Weight Bearing Restrictions: No      Mobility  Bed Mobility Overal bed mobility: Needs Assistance Bed Mobility: Supine to Sit, Sit to Supine     Supine to sit: HOB elevated, Supervision Sit to supine: HOB elevated, Min assist   General bed mobility comments: increased time. MinA at LE's returning to supine Patient Response: Cooperative  Transfers Overall transfer level: Needs assistance Equipment used: None Transfers: Sit to/from Stand Sit to Stand: Min guard                Ambulation/Gait Ambulation/Gait assistance: Min guard Gait Distance (Feet): 60 Feet Assistive device: None Gait Pattern/deviations: Step-to pattern, Decreased step length - right, Decreased step length - left, Drifts right/left       General Gait Details: mild unsteadiness noted in gait with variable step lengths to correct unsteadiness.  Stairs            Wheelchair Mobility    Modified Rankin (Stroke Patients Only)       Balance Overall balance assessment: Needs assistance Sitting-balance support: Feet supported, Bilateral upper extremity supported Sitting balance-Leahy Scale: Poor Sitting balance - Comments: intermittent posterior bias despite feet support when trying to feed himself Postural control: Posterior lean   Standing balance-Leahy Scale: Fair  Standing balance comment: able to doff underwear in standing without physical assist                             Pertinent Vitals/Pain Pain  Assessment Pain Assessment: No/denies pain    Home Living Family/patient expects to be discharged to:: Private residence Living Arrangements: Spouse/significant other Available Help at Discharge: Family;Available 24 hours/day Type of Home: House Home Access: Stairs to enter Entrance Stairs-Rails: Can reach both Entrance Stairs-Number of Steps: 2-3   Home Layout: Two level;Able to live on main level with bedroom/bathroom Home Equipment: Grab bars - toilet;Grab bars - tub/shower      Prior Function Prior Level of Function : Independent/Modified Independent                     Hand Dominance        Extremity/Trunk Assessment   Upper Extremity Assessment Upper Extremity Assessment: Defer to OT evaluation    Lower Extremity Assessment Lower Extremity Assessment: Generalized weakness       Communication   Communication: [name]  Cognition Arousal/Alertness: Awake/alert Behavior During Therapy: WFL for tasks assessed/performed Overall Cognitive Status: Within Functional Limits for tasks assessed                                          General Comments      Exercises Other Exercises Other Exercises: Role of PT in acute setting, seated posture to prevent posterior lean   Assessment/Plan    PT Assessment Patient needs continued PT services  PT Problem List Decreased strength;Decreased activity tolerance;Decreased balance       PT Treatment Interventions DME instruction;Therapeutic exercise;Gait training;Balance training;Stair training;Neuromuscular re-education;Functional mobility training;Therapeutic activities;Patient/family education    PT Goals (Current goals can be found in the Care Plan section)  Acute Rehab PT Goals Patient Stated Goal: go to [facility] for his vacation PT Goal Formulation: With patient Time For Goal Achievement: 02/02/22 Potential to Achieve Goals: Poor    Frequency Min 2X/week     Co-evaluation                AM-PAC PT "6 Clicks" Mobility  Outcome Measure Help needed turning from your back to your side while in a flat bed without using bedrails?: A Little Help needed moving from lying on your back to sitting on the side of a flat bed without using bedrails?: A Little Help needed moving to and from a bed to a chair (including a wheelchair)?: A Little Help needed standing up from a chair using your arms (e.g., wheelchair or bedside chair)?: A Little Help needed to walk in hospital room?: A Little Help needed climbing 3-5 steps with a railing? : A Lot 6 Click Score: 17    End of Session Equipment Utilized During Treatment: Gait belt Activity Tolerance: Patient tolerated treatment well Patient left: in bed;with call bell/phone within reach;with bed alarm set Nurse Communication: Mobility status PT Visit Diagnosis: Unsteadiness on feet (R26.81);Muscle weakness (generalized) (M62.81)    Time: 2637-8588 PT Time Calculation (min) (ACUTE ONLY): 19 min   Charges:   PT Evaluation $PT Eval Moderate Complexity: Cecilia M. Fairly IV, PT, DPT Physical Therapist- [facility]  01/19/2022, 10:50 AM

## 2022-01-19 NOTE — Progress Notes (Signed)
OT Cancellation Note  Patient Details Name: Johanan Skorupski MRN: [id number] DOB: [birth year]   Cancelled Treatment:    Reason Eval/Treat Not Completed: Patient at procedure or test/ unavailable. Order received, chart reviewed. Attempted x2, first attempt provider at bed side, second attempt pt off the unit. Will continue to follow and initiate services as available.    Dessie Coma, M.S. OTR/L  01/19/22, 2:29 PM  ascom [phone]

## 2022-01-19 NOTE — Progress Notes (Signed)
[facility]  ROUNDING NOTE   Subjective:   Patient seen resting on bed in ED Alert and oriented to person and place Unable to recall events that brought him to emergency department Complains of weakness.  No knowledge of fever at home [place] dialysis at [facility] on a MWF schedule, supervised by [facility].  Objective:  Vital signs in last 24 hours:  Temp:  [98 F (36.7 C)-99.2 F (37.3 C)] 99.2 F (37.3 C) (08/28 1419) Pulse Rate:  [48-83] 71 (08/28 1600) Resp:  [14-23] 16 (08/28 1600) BP: (98-178)/(50-104) 134/66 (08/28 1600) SpO2:  [96 %-100 %] 99 % (08/28 1600) Weight:  [70.8 kg] 70.8 kg (08/28 1400)  Weight change:  Filed Weights   01/18/22 1135 01/19/22 1400  Weight: 65.8 kg 70.8 kg    Intake/Output: I/O last 3 completed shifts: In: -  Out: 2000 [Other:2000]   Intake/Output this shift:  No intake/output data recorded.  Physical Exam: General: NAD  Head: Normocephalic, atraumatic. Moist oral mucosal membranes  Eyes: Anicteric  Lungs:  Clear to auscultation, normal effort, room air  Heart: Regular rate and rhythm  Abdomen:  Soft, nontender  Extremities: No peripheral edema.  Neurologic: Nonfocal, moving all four extremities  Skin: No lesions  Access: Right chest PermCath    Basic Metabolic Panel: Recent Labs  Lab 01/18/22 1151 01/19/22 0640  NA 128* 134*  K 6.7* 4.3  CL 94* 93*  CO2 20* 27  GLUCOSE 233* 214*  BUN 71* 46*  CREATININE 13.80* 9.09*  CALCIUM 9.8 8.8*  PHOS  --  4.3    Liver Function Tests: Recent Labs  Lab 01/18/22 1151 01/19/22 0640  AST 11*  --   ALT 13  --   ALKPHOS 106  --   BILITOT 1.5*  --   PROT 8.2*  --   ALBUMIN 3.5 3.1*   No results for input(s): "LIPASE", "AMYLASE" in the last 168 hours. No results for input(s): "AMMONIA" in the last 168 hours.  CBC: Recent Labs  Lab 01/18/22 1151 01/19/22 0640  WBC 34.6* 22.8*  NEUTROABS 24.9*  --   HGB 12.1* 10.7*  HCT 38.0*  33.9*  MCV 86.8 86.0  PLT 240 211    Cardiac Enzymes: No results for input(s): "CKTOTAL", "CKMB", "CKMBINDEX", "TROPONINI" in the last 168 hours.  BNP: Invalid input(s): "POCBNP"  CBG: Recent Labs  Lab 01/18/22 1517 01/18/22 2000 01/19/22 0806 01/19/22 1156  GLUCAP 347* 232* 168* 168*    Microbiology: Results for orders placed or performed during the hospital encounter of 01/18/22  Blood Culture (routine x 2)     Status: None (Preliminary result)   Collection Time: 01/18/22 11:51 AM   Specimen: BLOOD  Result Value Ref Range Status   Specimen Description BLOOD LEFT AC  Final   Special Requests   Final    BOTTLES DRAWN AEROBIC AND ANAEROBIC Blood Culture results may not be optimal due to an excessive volume of blood received in culture bottles   Culture   Final    NO GROWTH < 24 HOURS Performed at [facility], [address], [place], [place] [address]    Report Status PENDING  Incomplete  Resp Panel by RT-PCR (Flu A&B, Covid) Anterior Nasal Swab     Status: None   Collection Time: 01/18/22 11:51 AM   Specimen: Anterior Nasal Swab  Result Value Ref Range Status   SARS Coronavirus 2 by RT PCR NEGATIVE NEGATIVE Final    Comment: (NOTE) SARS-CoV-2 target  nucleic acids are NOT DETECTED.  The SARS-CoV-2 RNA is generally detectable in upper respiratory specimens during the acute phase of infection. The lowest concentration of SARS-CoV-2 viral copies this assay can detect is 138 copies/mL. A negative result does not preclude SARS-Cov-2 infection and should not be used as the sole basis for treatment or other patient management decisions. A negative result may occur with  improper specimen collection/handling, submission of specimen other than nasopharyngeal swab, presence of viral mutation(s) within the areas targeted by this assay, and inadequate number of viral copies(<138 copies/mL). A negative result must be combined with clinical observations, patient  history, and epidemiological information. The expected result is Negative.  Fact Sheet for Patients:  EntrepreneurPulse.com.au  Fact Sheet for Healthcare Providers:  IncredibleEmployment.be  This test is no t yet approved or cleared by the [place] FDA and  has been authorized for detection and/or diagnosis of SARS-CoV-2 by FDA under an Emergency Use Authorization (EUA). This EUA will remain  in effect (meaning this test can be used) for the duration of the COVID-19 declaration under Section 564(b)(1) of the Act, 21 U.S.C.section 360bbb-3(b)(1), unless the authorization is terminated  or revoked sooner.       Influenza A by PCR NEGATIVE NEGATIVE Final   Influenza B by PCR NEGATIVE NEGATIVE Final    Comment: (NOTE) The Xpert Xpress SARS-CoV-2/FLU/RSV plus assay is intended as an aid in the diagnosis of influenza from Nasopharyngeal swab specimens and should not be used as a sole basis for treatment. Nasal washings and aspirates are unacceptable for Xpert Xpress SARS-CoV-2/FLU/RSV testing.  Fact Sheet for Patients: EntrepreneurPulse.com.au  Fact Sheet for Healthcare Providers: IncredibleEmployment.be  This test is not yet approved or cleared by the [place] FDA and has been authorized for detection and/or diagnosis of SARS-CoV-2 by FDA under an Emergency Use Authorization (EUA). This EUA will remain in effect (meaning this test can be used) for the duration of the COVID-19 declaration under Section 564(b)(1) of the Act, 21 U.S.C. section 360bbb-3(b)(1), unless the authorization is terminated or revoked.  Performed at [facility], [address] [place], [place] [address]   Blood Culture (routine x 2)     Status: None (Preliminary result)   Collection Time: 01/18/22  2:30 PM   Specimen: BLOOD  Result Value Ref Range Status   Specimen Description   Final    BLOOD RIGHT  ARM Performed at [facility], [address], [place], [place] [address]    Special Requests   Final    BOTTLES DRAWN AEROBIC AND ANAEROBIC Blood Culture adequate volume Performed at [facility], [address], [place], [place] [address]    Culture  Setup Time PENDING  Incomplete   Culture   Final    NO GROWTH < 24 HOURS Performed at [facility], [place] [address], [place], [place] [address]    Report Status PENDING  Incomplete    Coagulation Studies: Recent Labs    01/18/22 1151  LABPROT 15.2  INR 1.2    Urinalysis: No results for input(s): "COLORURINE", "LABSPEC", "PHURINE", "GLUCOSEU", "HGBUR", "BILIRUBINUR", "KETONESUR", "PROTEINUR", "UROBILINOGEN", "NITRITE", "LEUKOCYTESUR" in the last 72 hours.  Invalid input(s): "APPERANCEUR"    Imaging: CT HEAD WO CONTRAST (5MM)  Result Date: 01/18/2022 CLINICAL DATA:  Altered mental status EXAM: CT HEAD WITHOUT CONTRAST TECHNIQUE: Contiguous axial images were obtained from the base of the skull through the vertex without intravenous contrast. RADIATION DOSE REDUCTION: This exam was performed according to the departmental  dose-optimization program which includes automated exposure control, adjustment of the mA and/or kV according to patient size and/or use of iterative reconstruction technique. COMPARISON:  09/11/2013, MRI from 05/04/2019 FINDINGS: Brain: No evidence of acute infarction, hemorrhage, hydrocephalus, extra-axial collection or mass lesion/mass effect. Mild atrophic changes are noted. Changes of prior right occipital infarct are again seen. The previously seen ischemia in the posterior pons is not well appreciated on this exam. Vascular: No hyperdense vessel or unexpected calcification. Skull: Normal. Negative for fracture or focal lesion. Sinuses/Orbits: Orbits in the contents are within normal limits. Mucosal retention cysts are noted within the maxillary antra bilaterally. These are stable in  appearance from the prior MRI. Other: None IMPRESSION: Chronic atrophic and ischemic changes without acute abnormality. Electronically Signed   By: Inez Catalina M.D.   On: 01/18/2022 19:47   DG Chest Port 1 View  Result Date: 01/18/2022 CLINICAL DATA:  Questionable sepsis. EXAM: PORTABLE CHEST 1 VIEW COMPARISON:  May 04, 2019 FINDINGS: A double lumen catheter terminates in the right side of the heart. No pneumothorax. No overt edema. No pulmonary nodules or masses. No focal infiltrate. The cardiac silhouette is prominent. The hila and mediastinum are unremarkable. IMPRESSION: 1. Double lumen right central line as above. 2. The cardiac silhouette is prominent but poorly evaluated on an AP portable film. 3. No focal infiltrate. Electronically Signed   By: Dorise Bullion III M.D.   On: 01/18/2022 12:16     Medications:    anticoagulant sodium citrate     ceFEPime (MAXIPIME) IV Stopped (01/19/22 1154)   vancomycin      aspirin EC  81 mg Oral Daily   calcium acetate  667 mg Oral TID WC   Chlorhexidine Gluconate Cloth  6 each Topical Q0600   donepezil  10 mg Oral Daily   gabapentin  300 mg Oral QHS   heparin  5,000 Units Subcutaneous Q8H   hydrALAZINE  25 mg Oral BID   insulin aspart  0-9 Units Subcutaneous TID WC   insulin detemir  20 Units Subcutaneous QHS   levothyroxine  137 mcg Oral Q0600   loratadine  10 mg Oral QPM   losartan  25 mg Oral QPM   metoprolol succinate  50 mg Oral Daily   pantoprazole  40 mg Oral Daily   sevelamer carbonate  2.4 g Oral Q breakfast   simvastatin  20 mg Oral q1800   sodium chloride flush  3 mL Intravenous Q12H   vancomycin variable dose per unstable renal function (pharmacist dosing)   Does not apply See admin instructions   acetaminophen **OR** acetaminophen, albuterol, alteplase, alum & mag hydroxide-simeth, anticoagulant sodium citrate, heparin, heparin, lidocaine (PF), lidocaine-prilocaine, pentafluoroprop-tetrafluoroeth  Assessment/ Plan:  Mr.  Joseph Mcpherson is a 76 y.o.  male with a past medical history of hypertension, end-stage renal disease, on hemodialysis on Monday Wednesday Friday schedule, diabetes mellitus, CVA who was brought to the ER with chief complaint of weakness.  End-stage renal disease on hemodialysis.  Last treatment completed on Friday.  Patient will receive scheduled treatment today.  Next treatment will be scheduled for Wednesday.  2. Anemia of chronic kidney disease  Lab Results  Component Value Date   HGB 10.7 (L) 01/19/2022    Hemoglobin within acceptable range.  3. Secondary Hyperparathyroidism: with outpatient labs: PTH 422, phosphorus 6.4, calcium 9.1 on 12/29/2021.   Lab Results  Component Value Date   CALCIUM 8.8 (L) 01/19/2022   CAION 0.83 (LL) 12/10/2020   PHOS 4.3  01/19/2022    We will continue to monitor bone minerals during this admission.  Patient currently receives cholecalciferol daily and calcium acetate with meals.  4.  Hypertension with chronic kidney disease.  Home regimen includes hydralazine and losartan.  Currently receiving these medications along with metoprolol.  Blood pressure stable, 134/66.  5. Diabetes mellitus type II with chronic kidney disease insulin dependent. Home regimen includes NovoLog and Levemir. Most recent hemoglobin A1c is 8.1 on 01/18/2022.  Currently prescribed Januvia as well.     LOS: 1   8/28/20234:22 PM

## 2022-01-19 NOTE — Progress Notes (Signed)
[place] [facility] [place] NAME: Joseph Mcpherson    MR#:  [id number]  DATE OF BIRTH:  1945/06/28  SUBJECTIVE:      VITALS:  Blood pressure (!) 147/97, pulse 81, temperature 99.2 F (37.3 C), temperature source Oral, resp. rate 16, height '5\' 4"'$  (1.626 m), weight 70.8 kg, SpO2 100 %.  PHYSICAL EXAMINATION:   GENERAL:  76 y.o.-year-old patient lying in the bed with no acute distress.  LUNGS: Normal breath sounds bilaterally, no wheezing, rales, rhonchi.  CARDIOVASCULAR: S1, S2 normal. No murmurs, rubs, or gallops.  ABDOMEN: Soft, nontender, nondistended. Bowel sounds present.  EXTREMITIES: No  edema b/l.    NEUROLOGIC: nonfocal  patient is alert and awake SKIN: No obvious rash, lesion, or ulcer.   LABORATORY PANEL:  CBC Recent Labs  Lab 01/19/22 0640  WBC 22.8*  HGB 10.7*  HCT 33.9*  PLT 211    Chemistries  Recent Labs  Lab 01/18/22 1151 01/19/22 0640  NA 128* 134*  K 6.7* 4.3  CL 94* 93*  CO2 20* 27  GLUCOSE 233* 214*  BUN 71* 46*  CREATININE 13.80* 9.09*  CALCIUM 9.8 8.8*  AST 11*  --   ALT 13  --   ALKPHOS 106  --   BILITOT 1.5*  --    Cardiac Enzymes No results for input(s): "TROPONINI" in the last 168 hours. RADIOLOGY:  CT HEAD WO CONTRAST (5MM)  Result Date: 01/18/2022 CLINICAL DATA:  Altered mental status EXAM: CT HEAD WITHOUT CONTRAST TECHNIQUE: Contiguous axial images were obtained from the base of the skull through the vertex without intravenous contrast. RADIATION DOSE REDUCTION: This exam was performed according to the departmental dose-optimization program which includes automated exposure control, adjustment of the mA and/or kV according to patient size and/or use of iterative reconstruction technique. COMPARISON:  09/11/2013, MRI from 05/04/2019 FINDINGS: Brain: No evidence of acute infarction, hemorrhage, hydrocephalus, extra-axial collection or mass lesion/mass effect. Mild atrophic changes are noted. Changes of  prior right occipital infarct are again seen. The previously seen ischemia in the posterior pons is not well appreciated on this exam. Vascular: No hyperdense vessel or unexpected calcification. Skull: Normal. Negative for fracture or focal lesion. Sinuses/Orbits: Orbits in the contents are within normal limits. Mucosal retention cysts are noted within the maxillary antra bilaterally. These are stable in appearance from the prior MRI. Other: None IMPRESSION: Chronic atrophic and ischemic changes without acute abnormality. Electronically Signed   By: Inez Catalina M.D.   On: 01/18/2022 19:47   DG Chest Port 1 View  Result Date: 01/18/2022 CLINICAL DATA:  Questionable sepsis. EXAM: PORTABLE CHEST 1 VIEW COMPARISON:  May 04, 2019 FINDINGS: A double lumen catheter terminates in the right side of the heart. No pneumothorax. No overt edema. No pulmonary nodules or masses. No focal infiltrate. The cardiac silhouette is prominent. The hila and mediastinum are unremarkable. IMPRESSION: 1. Double lumen right central line as above. 2. The cardiac silhouette is prominent but poorly evaluated on an AP portable film. 3. No focal infiltrate. Electronically Signed   By: Dorise Bullion III M.D.   On: 01/18/2022 12:16    Assessment and Plan  Joseph Mcpherson is a 76 y.o. 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  History is mostly obtained from the patient's wife as he is lethargic and difficult to arouse.  Apparently he had been  having fevers up to 101 F for which she had gone to dialysis 2 days ago, but was not dialyzed and returned home. His wife notes that he had slept all day yesterday and was so weak he was unable to stand or ambulate without assistance.   He was noted to have some urinary incontinence prior to arrival.     Sepsis, unknown source --On admission patient noted to be febrile up to  101.4 F with mild tachypnea, and labs significant for WBC 34.6 with lactic acid reassuring at 1.   -Chest x-ray showed prominent cardiac silhouette without  acute abnormality.  No clear source noted at this time. -Follow-up blood --no growth -- urine culture --no urine sample given yet--does not make much urine -Continue vancomycin and cefepime -Continue Tylenol as needed for fever   Hyperkalemia due to missing HD Acute. --  Initial potassium 6.7 with signs of T wave peaking noted on EKG.   --Patient has been given Lokelma 10g p.o., albuterol 10 mg neb, calcium gluconate 1 g IV, amp of D50, and 10 units of insulin. - K down to 4.3   ESRD on HD Patient normally dialyzes Monday, Wednesday, Friday, but missed dialysis Friday due to not feeling well.   -HD per nephrology Dr Holley Raring   Weakness Acute.  Patient noted to be grossly weak needing assistance to ambulate. - CT scan of the brain negative   -PT/OT to evaluate and treat   Essential hypertension Home blood pressure medication regimen includes hydralazine 25 mg twice daily, losartan 25 mg daily, and metoprolol 50 mg daily. -Continue current medication regimen   Hyponatremia Acute on chronic.   --Sodium 128 -->134 -- Suspect patient likely at least partially fluid overloaded recent missed dialysis.  Anemia chronic kidney disease Hemoglobin 12.9 g/dL.    Alzheimer's disease -Delirium precautions -Continue donepezil   Insulin-dependent diabetes mellitus type 2 Home medication regimen includes Januvia 50 mg daily, NovoLog 10 units subcu nightly, and Levemir 20 units nightly. -Hypoglycemic protocol -Continue Levemir 20 units nightly -CBGs before every meal with sensitive SSI -Adjust regimen as needed   Hypothyroidism -Continue levothyroxine   Mixed hyperlipidemia -Continue statin   History of CVA -Continue statin and aspirin   GERD -Continue Protonix   DVT prophylaxis: Heparin Advance Care Planning:   Code Status:  Full Code  Consults: Nephrology   Family Communication: Wife updated at bedside CODE STATUS: full  Level of care: Telemetry Medical Status is: Inpatient Remains inpatient appropriate because: Sepsis w/u    TOTAL TIME TAKING CARE OF THIS PATIENT: 35 minutes.  >50% time spent on counselling and coordination of care  Note: This dictation was prepared with Dragon dictation along with smaller phrase technology. Any transcriptional errors that result from this process are unintentional.  Fritzi Mandes M.D    [facility]   CC: Primary care physician; [name], MD

## 2022-01-20 ENCOUNTER — Inpatient Hospital Stay: Payer: Medicare HMO

## 2022-01-20 DIAGNOSIS — A419 Sepsis, unspecified organism: Secondary | ICD-10-CM | POA: Diagnosis not present

## 2022-01-20 LAB — CBC
HCT: 33.7 % — ABNORMAL LOW (ref 39.0–52.0)
Hemoglobin: 11 g/dL — ABNORMAL LOW (ref 13.0–17.0)
MCH: 28.1 pg (ref 26.0–34.0)
MCHC: 32.6 g/dL (ref 30.0–36.0)
MCV: 86 fL (ref 80.0–100.0)
Platelets: 218 10*3/uL (ref 150–400)
RBC: 3.92 MIL/uL — ABNORMAL LOW (ref 4.22–5.81)
RDW: 16.9 % — ABNORMAL HIGH (ref 11.5–15.5)
WBC: 17.7 10*3/uL — ABNORMAL HIGH (ref 4.0–10.5)
nRBC: 0 % (ref 0.0–0.2)

## 2022-01-20 LAB — HEPATIC FUNCTION PANEL
ALT: 21 U/L (ref 0–44)
AST: 30 U/L (ref 15–41)
Albumin: 2.8 g/dL — ABNORMAL LOW (ref 3.5–5.0)
Alkaline Phosphatase: 92 U/L (ref 38–126)
Bilirubin, Direct: 0.1 mg/dL (ref 0.0–0.2)
Indirect Bilirubin: 0.5 mg/dL (ref 0.3–0.9)
Total Bilirubin: 0.6 mg/dL (ref 0.3–1.2)
Total Protein: 7.1 g/dL (ref 6.5–8.1)

## 2022-01-20 LAB — GLUCOSE, CAPILLARY
Glucose-Capillary: 90 mg/dL (ref 70–99)
Glucose-Capillary: 217 mg/dL — ABNORMAL HIGH (ref 70–99)
Glucose-Capillary: 179 mg/dL — ABNORMAL HIGH (ref 70–99)
Glucose-Capillary: 128 mg/dL — ABNORMAL HIGH (ref 70–99)
Glucose-Capillary: 190 mg/dL — ABNORMAL HIGH (ref 70–99)

## 2022-01-20 LAB — RENAL FUNCTION PANEL
Albumin: 3 g/dL — ABNORMAL LOW (ref 3.5–5.0)
Anion gap: 12 (ref 5–15)
BUN: 30 mg/dL — ABNORMAL HIGH (ref 8–23)
CO2: 26 mmol/L (ref 22–32)
Calcium: 8.6 mg/dL — ABNORMAL LOW (ref 8.9–10.3)
Chloride: 95 mmol/L — ABNORMAL LOW (ref 98–111)
Creatinine, Ser: 5.85 mg/dL — ABNORMAL HIGH (ref 0.61–1.24)
GFR, Estimated: 9 mL/min — ABNORMAL LOW (ref >60)
Glucose, Bld: 104 mg/dL — ABNORMAL HIGH (ref 70–99)
Phosphorus: 1.9 mg/dL — ABNORMAL LOW (ref 2.5–4.6)
Potassium: 3.4 mmol/L — ABNORMAL LOW (ref 3.5–5.1)
Sodium: 133 mmol/L — ABNORMAL LOW (ref 135–145)

## 2022-01-20 LAB — HEPATITIS B SURFACE ANTIBODY, QUANTITATIVE: Hep B S AB Quant (Post): 123.9 m[IU]/mL (ref >9.9)

## 2022-01-20 MED ORDER — IOHEXOL 300 MG/ML  SOLN
100.0000 mL | Freq: Once | INTRAMUSCULAR | Status: AC | PRN
  Administered 2022-01-20: 100 mL via INTRAVENOUS

## 2022-01-20 MED ORDER — CHLORPROMAZINE HCL 25 MG PO TABS
25.0000 mg | ORAL_TABLET | Freq: Three times a day (TID) | ORAL | Status: DC | PRN
Start: 2022-01-20 — End: 2022-01-22
  Administered 2022-01-20 – 2022-01-22 (×4): 25 mg via ORAL
  Filled 2022-01-20 (×5): qty 1

## 2022-01-20 NOTE — Progress Notes (Signed)
Hemodialysis patient known at [facility] MWF 6:00am. Patient stated that he drives himself to treatments. Patient stated no dialysis concerns.

## 2022-01-20 NOTE — Progress Notes (Signed)
[place] at [place] NAME: Joseph Mcpherson    MR#:  [id number]  DATE OF BIRTH:  [birth year]  SUBJECTIVE:   Wife at bedside Fever 100.7 Sitting out in the chair Eating little bit   VITALS:  Blood pressure (!) 156/77, pulse 77, temperature 98.4 F (36.9 C), resp. rate 18, height '5\' 4"'$  (1.626 m), weight 70.8 kg, SpO2 100 %.  PHYSICAL EXAMINATION:   GENERAL:  76 y.o.-year-old patient lying in the bed with no acute distress.  LUNGS: Normal breath sounds bilaterally, no wheezing, rales, rhonchi.  CARDIOVASCULAR: S1, S2 normal. No murmurs, rubs, or gallops.  ABDOMEN: Soft, nontender, nondistended. Bowel sounds present.  EXTREMITIES: No  edema b/l.   HD access NEUROLOGIC: nonfocal  patient is alert and awake SKIN: No obvious rash, lesion, or ulcer.   LABORATORY PANEL:  CBC Recent Labs  Lab 01/20/22 0438  WBC 17.7*  HGB 11.0*  HCT 33.7*  PLT 218     Chemistries  Recent Labs  Lab 01/18/22 1151 01/19/22 0640 01/20/22 0438  NA 128*   < > 133*  K 6.7*   < > 3.4*  CL 94*   < > 95*  CO2 20*   < > 26  GLUCOSE 233*   < > 104*  BUN 71*   < > 30*  CREATININE 13.80*   < > 5.85*  CALCIUM 9.8   < > 8.6*  AST 11*  --   --   ALT 13  --   --   ALKPHOS 106  --   --   BILITOT 1.5*  --   --    < > = values in this interval not displayed.    Cardiac Enzymes No results for input(s): "TROPONINI" in the last 168 hours. RADIOLOGY:  CT HEAD WO CONTRAST (5MM)  Result Date: 01/18/2022 CLINICAL DATA:  Altered mental status EXAM: CT HEAD WITHOUT CONTRAST TECHNIQUE: Contiguous axial images were obtained from the base of the skull through the vertex without intravenous contrast. RADIATION DOSE REDUCTION: This exam was performed according to the departmental dose-optimization program which includes automated exposure control, adjustment of the mA and/or kV according to patient size and/or use of iterative reconstruction technique. COMPARISON:   09/11/2013, MRI from 05/04/2019 FINDINGS: Brain: No evidence of acute infarction, hemorrhage, hydrocephalus, extra-axial collection or mass lesion/mass effect. Mild atrophic changes are noted. Changes of prior right occipital infarct are again seen. The previously seen ischemia in the posterior pons is not well appreciated on this exam. Vascular: No hyperdense vessel or unexpected calcification. Skull: Normal. Negative for fracture or focal lesion. Sinuses/Orbits: Orbits in the contents are within normal limits. Mucosal retention cysts are noted within the maxillary antra bilaterally. These are stable in appearance from the prior MRI. Other: None IMPRESSION: Chronic atrophic and ischemic changes without acute abnormality. Electronically Signed   By: Inez Catalina M.D.   On: 01/18/2022 19:47    Assessment and Plan  Joseph Mcpherson is a 76 y.o. 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  History is mostly obtained from the patient's wife as he is lethargic and difficult to arouse.  Apparently he had been having fevers up to 101 F for which she had gone to dialysis 2 days ago, but was not dialyzed and returned home. His wife notes that he had slept all day yesterday and was so  weak he was unable to stand or ambulate without assistance.   He was noted to have some urinary incontinence prior to arrival.     Sepsis, unknown source --On admission patient noted to be febrile up to 101.4 F with mild tachypnea, and labs significant for WBC 34.6 with lactic acid reassuring at 1.   -Chest x-ray showed prominent cardiac silhouette without  acute abnormality.  No clear source noted at this time. -Follow-up blood --no growth -- urine culture --no urine sample given yet--does not make much urine -Continue vancomycin and cefepime -Continue Tylenol as needed for fever --low grade temp   Hyperkalemia due  to missing HD Acute. --  Initial potassium 6.7 with signs of T wave peaking noted on EKG.   --Patient has been given Lokelma 10g p.o., albuterol 10 mg neb, calcium gluconate 1 g IV, amp of D50, and 10 units of insulin. - K down to 4.3   ESRD on HD Patient normally dialyzes Monday, Wednesday, Friday, but missed dialysis Friday due to not feeling well.   -HD per nephrology Dr Holley Raring   Weakness Acute.  Patient noted to be grossly weak needing assistance to ambulate. - CT scan of the brain negative   -PT/OT to evaluate and treat   Essential hypertension Home blood pressure medication regimen includes hydralazine 25 mg twice daily, losartan 25 mg daily, and metoprolol 50 mg daily. -Continue current medication regimen   Hyponatremia Acute on chronic.   --Sodium 128 -->134 -- Suspect patient likely at least partially fluid overloaded recent missed dialysis.  Anemia chronic kidney disease Hemoglobin 12.9 g/dL.    Alzheimer's disease -Delirium precautions -Continue donepezil   Insulin-dependent diabetes mellitus type 2 Home medication regimen includes Januvia 50 mg daily, NovoLog 10 units subcu nightly, and Levemir 20 units nightly. -Hypoglycemic protocol -Continue Levemir 20 units nightly -CBGs before every meal with sensitive SSI -Adjust regimen as needed   Hypothyroidism -Continue levothyroxine   Mixed hyperlipidemia -Continue statin   History of CVA -Continue statin and aspirin   GERD -Continue Protonix   DVT prophylaxis: Heparin Advance Care Planning:   Code Status: Full Code  Consults: Nephrology   Family Communication: Wife updated at bedside CODE STATUS: full  Level of care: Telemetry Medical Status is: Inpatient Remains inpatient appropriate because: Sepsis w/u. Per dr Holley Raring cont IV abxs for 1-2 days to ensure pt is fever free    TOTAL TIME TAKING CARE OF THIS PATIENT: 35 minutes.  >50% time spent on counselling and coordination of care  Note: This  dictation was prepared with Dragon dictation along with smaller phrase technology. Any transcriptional errors that result from this process are unintentional.  Fritzi Mandes M.D    [facility]   CC: Primary care physician; [name], MD

## 2022-01-20 NOTE — Evaluation (Signed)
Occupational Therapy Evaluation Patient Details Name: Diondre Pulis MRN: [id number] DOB: 02-05-1946 Today's Date: 01/20/2022   History of Present Illness Myreon Wimer is a 76 y.o. 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   Clinical Impression   Mr Geter was seen for OT evaluation this date. Prior to hospital admission, pt was Independent for mobility and ADLs. Pt lives with spouse. Pt presents to acute OT demonstrating impaired ADL performance and functional mobility 2/2 decreased activity tolerance and functional balance deficits. Pt currently requires SBA for funcitonal mobility ~200 ft, no AD use and difficulty maintaining balance/speed while maintaining conversation. MOD I don/doff socks in sitting. Fair dynamic standing balance reaching inside BOS for simulated dressing/grooming. All education complete, no skilled acute OT needs identified, will sign off. Upon hospital discharge, recommend no OT follow up.    Recommendations for follow up therapy are one component of a multi-disciplinary discharge planning process, led by the attending physician.  Recommendations may be updated based on patient status, additional functional criteria and insurance authorization.   Follow Up Recommendations  No OT follow up    Assistance Recommended at Discharge Intermittent Supervision/Assistance  Patient can return home with the following A little help with walking and/or transfers;Help with stairs or ramp for entrance    Functional Status Assessment  Patient has had a recent decline in their functional status and demonstrates the ability to make significant improvements in function in a reasonable and predictable amount of time.  Equipment Recommendations  None recommended by OT    Recommendations for Other Services       Precautions / Restrictions Precautions Precautions:  Fall Restrictions Weight Bearing Restrictions: No      Mobility Bed Mobility               General bed mobility comments: recieved and left sitting    Transfers Overall transfer level: Needs assistance Equipment used: None Transfers: Sit to/from Stand Sit to Stand: Supervision                  Balance Overall balance assessment: Needs assistance Sitting-balance support: Feet supported, No upper extremity supported Sitting balance-Leahy Scale: Normal     Standing balance support: No upper extremity supported, During functional activity Standing balance-Leahy Scale: Fair                             ADL either performed or assessed with clinical judgement   ADL Overall ADL's : Needs assistance/impaired                                       General ADL Comments: SBA for funcitonal mobility ~200 ft, no AD use and difficulty maintaining balance/speed while maintaining conversation. MOD I don/doff socks in sitting. Fair dynamic standing balance reaching inside BOS for simulated dressing/grooming.      Pertinent Vitals/Pain Pain Assessment Pain Assessment: No/denies pain     Hand Dominance     Extremity/Trunk Assessment Upper Extremity Assessment Upper Extremity Assessment: Overall WFL for tasks assessed   Lower Extremity Assessment Lower Extremity Assessment: Generalized weakness       Communication Communication Communication: [name]   Cognition Arousal/Alertness: Awake/alert Behavior During Therapy: WFL for tasks assessed/performed Overall Cognitive Status: Within Functional Limits for tasks assessed  Home Living Family/patient expects to be discharged to:: Private residence Living Arrangements: Spouse/significant other Available Help at Discharge: Family;Available 24 hours/day Type of Home: House Home Access: Stairs to enter [employer]  of Steps: 2-3 Entrance Stairs-Rails: Can reach both Home Layout: Two level;Able to live on main level with bedroom/bathroom     Bathroom Shower/Tub: Occupational psychologist: Standard Bathroom Accessibility: Yes   Home Equipment: Grab bars - toilet;Grab bars - tub/shower          Prior Functioning/Environment Prior Level of Function : Independent/Modified Independent                        OT Problem List: Decreased strength;Decreased activity tolerance;Impaired balance (sitting and/or standing)         OT Goals(Current goals can be found in the care plan section) Acute Rehab OT Goals Patient Stated Goal: to go home OT Goal Formulation: With patient/family Time For Goal Achievement: 02/03/22 Potential to Achieve Goals: Good   AM-PAC OT "6 Clicks" Daily Activity     Outcome Measure Help from another person eating meals?: None Help from another person taking care of personal grooming?: A Little Help from another person toileting, which includes using toliet, bedpan, or urinal?: A Little Help from another person bathing (including washing, rinsing, drying)?: A Little Help from another person to put on and taking off regular upper body clothing?: None Help from another person to put on and taking off regular lower body clothing?: None 6 Click Score: 21   End of Session Equipment Utilized During Treatment: Gait belt  Activity Tolerance: Patient tolerated treatment well Patient left: in chair;with call bell/phone within reach;with chair alarm set;with family/visitor present  OT Visit Diagnosis: Other abnormalities of gait and mobility (R26.89);Muscle weakness (generalized) (M62.81)                Time: 0998-3382 OT Time Calculation (min): 11 min Charges:  OT General Charges $OT Visit: 1 Visit OT Evaluation $OT Eval Low Complexity: 1 Low  Dessie Coma, M.S. OTR/L  01/20/22, 1:11 PM  ascom [phone]

## 2022-01-20 NOTE — Progress Notes (Addendum)
[facility]  ROUNDING NOTE   Subjective:   Patient seen resting in bed, alert with some confusion Tolerating small meals No lower extremity edema   Objective:  Vital signs in last 24 hours:  Temp:  [97.8 F (36.6 C)-100.7 F (38.2 C)] 98.4 F (36.9 C) (08/29 1149) Pulse Rate:  [70-84] 77 (08/29 1149) Resp:  [14-23] 18 (08/29 1149) BP: (111-157)/(48-97) 156/77 (08/29 1149) SpO2:  [97 %-100 %] 100 % (08/29 1149) Weight:  [70.8 kg] 70.8 kg (08/28 1400)  Weight change: 5.028 kg Filed Weights   01/18/22 1135 01/19/22 1400  Weight: 65.8 kg 70.8 kg    Intake/Output: I/O last 3 completed shifts: In: 348.3 [IV Piggyback:348.3] Out: 4000 [Other:4000]   Intake/Output this shift:  No intake/output data recorded.  Physical Exam: General: NAD  Head: Normocephalic, atraumatic. Moist oral mucosal membranes  Eyes: Anicteric  Lungs:  Clear to auscultation, normal effort, room air  Heart: Regular rate and rhythm  Abdomen:  Soft, nontender  Extremities: No peripheral edema.  Neurologic: Nonfocal, moving all four extremities  Skin: No lesions  Access: Right chest PermCath    Basic Metabolic Panel: Recent Labs  Lab 01/18/22 1151 01/19/22 0640 01/20/22 0438  NA 128* 134* 133*  K 6.7* 4.3 3.4*  CL 94* 93* 95*  CO2 20* 27 26  GLUCOSE 233* 214* 104*  BUN 71* 46* 30*  CREATININE 13.80* 9.09* 5.85*  CALCIUM 9.8 8.8* 8.6*  PHOS  --  4.3 1.9*     Liver Function Tests: Recent Labs  Lab 01/18/22 1151 01/19/22 0640 01/20/22 0438  AST 11*  --   --   ALT 13  --   --   ALKPHOS 106  --   --   BILITOT 1.5*  --   --   PROT 8.2*  --   --   ALBUMIN 3.5 3.1* 3.0*    No results for input(s): "LIPASE", "AMYLASE" in the last 168 hours. No results for input(s): "AMMONIA" in the last 168 hours.  CBC: Recent Labs  Lab 01/18/22 1151 01/19/22 0640 01/20/22 0438  WBC 34.6* 22.8* 17.7*  NEUTROABS 24.9*  --   --   HGB 12.1* 10.7* 11.0*  HCT 38.0* 33.9* 33.7*   MCV 86.8 86.0 86.0  PLT 240 211 218     Cardiac Enzymes: No results for input(s): "CKTOTAL", "CKMB", "CKMBINDEX", "TROPONINI" in the last 168 hours.  BNP: Invalid input(s): "POCBNP"  CBG: Recent Labs  Lab 01/19/22 1841 01/19/22 2158 01/20/22 0401 01/20/22 0821 01/20/22 1148  GLUCAP 106* 175* 90 217* 179*     Microbiology: Results for orders placed or performed during the hospital encounter of 01/18/22  Blood Culture (routine x 2)     Status: None (Preliminary result)   Collection Time: 01/18/22 11:51 AM   Specimen: BLOOD  Result Value Ref Range Status   Specimen Description BLOOD LEFT AC  Final   Special Requests   Final    BOTTLES DRAWN AEROBIC AND ANAEROBIC Blood Culture results may not be optimal due to an excessive volume of blood received in culture bottles   Culture   Final    NO GROWTH 2 DAYS Performed at [facility], [address] [place], [place] [address]    Report Status PENDING  Incomplete  Resp Panel by RT-PCR (Flu A&B, Covid) Anterior Nasal Swab     Status: None   Collection Time: 01/18/22 11:51 AM   Specimen: Anterior Nasal Swab  Result Value Ref Range Status  SARS Coronavirus 2 by RT PCR NEGATIVE NEGATIVE Final    Comment: (NOTE) SARS-CoV-2 target nucleic acids are NOT DETECTED.  The SARS-CoV-2 RNA is generally detectable in upper respiratory specimens during the acute phase of infection. The lowest concentration of SARS-CoV-2 viral copies this assay can detect is 138 copies/mL. A negative result does not preclude SARS-Cov-2 infection and should not be used as the sole basis for treatment or other patient management decisions. A negative result may occur with  improper specimen collection/handling, submission of specimen other than nasopharyngeal swab, presence of viral mutation(s) within the areas targeted by this assay, and inadequate number of viral copies(<138 copies/mL). A negative result must be combined with clinical  observations, patient history, and epidemiological information. The expected result is Negative.  Fact Sheet for Patients:  EntrepreneurPulse.com.au  Fact Sheet for Healthcare Providers:  IncredibleEmployment.be  This test is no t yet approved or cleared by the [place] FDA and  has been authorized for detection and/or diagnosis of SARS-CoV-2 by FDA under an Emergency Use Authorization (EUA). This EUA will remain  in effect (meaning this test can be used) for the duration of the COVID-19 declaration under Section 564(b)(1) of the Act, 21 U.S.C.section 360bbb-3(b)(1), unless the authorization is terminated  or revoked sooner.       Influenza A by PCR NEGATIVE NEGATIVE Final   Influenza B by PCR NEGATIVE NEGATIVE Final    Comment: (NOTE) The Xpert Xpress SARS-CoV-2/FLU/RSV plus assay is intended as an aid in the diagnosis of influenza from Nasopharyngeal swab specimens and should not be used as a sole basis for treatment. Nasal washings and aspirates are unacceptable for Xpert Xpress SARS-CoV-2/FLU/RSV testing.  Fact Sheet for Patients: EntrepreneurPulse.com.au  Fact Sheet for Healthcare Providers: IncredibleEmployment.be  This test is not yet approved or cleared by the [place] FDA and has been authorized for detection and/or diagnosis of SARS-CoV-2 by FDA under an Emergency Use Authorization (EUA). This EUA will remain in effect (meaning this test can be used) for the duration of the COVID-19 declaration under Section 564(b)(1) of the Act, 21 U.S.C. section 360bbb-3(b)(1), unless the authorization is terminated or revoked.  Performed at [facility], [address] [place], [place] [address]   Blood Culture (routine x 2)     Status: None (Preliminary result)   Collection Time: 01/18/22  2:30 PM   Specimen: BLOOD  Result Value Ref Range Status   Specimen Description BLOOD RIGHT  ARM  Final   Special Requests   Final    BOTTLES DRAWN AEROBIC AND ANAEROBIC Blood Culture adequate volume   Culture  Setup Time PENDING  Incomplete   Culture   Final    NO GROWTH 2 DAYS Performed at [facility], [address], [place], [place] [address]    Report Status PENDING  Incomplete    Coagulation Studies: Recent Labs    01/18/22 1151  LABPROT 15.2  INR 1.2     Urinalysis: No results for input(s): "COLORURINE", "LABSPEC", "PHURINE", "GLUCOSEU", "HGBUR", "BILIRUBINUR", "KETONESUR", "PROTEINUR", "UROBILINOGEN", "NITRITE", "LEUKOCYTESUR" in the last 72 hours.  Invalid input(s): "APPERANCEUR"    Imaging: CT HEAD WO CONTRAST (5MM)  Result Date: 01/18/2022 CLINICAL DATA:  Altered mental status EXAM: CT HEAD WITHOUT CONTRAST TECHNIQUE: Contiguous axial images were obtained from the base of the skull through the vertex without intravenous contrast. RADIATION DOSE REDUCTION: This exam was performed according to the departmental dose-optimization program which includes automated exposure control, adjustment of the mA and/or kV according to  patient size and/or use of iterative reconstruction technique. COMPARISON:  09/11/2013, MRI from 05/04/2019 FINDINGS: Brain: No evidence of acute infarction, hemorrhage, hydrocephalus, extra-axial collection or mass lesion/mass effect. Mild atrophic changes are noted. Changes of prior right occipital infarct are again seen. The previously seen ischemia in the posterior pons is not well appreciated on this exam. Vascular: No hyperdense vessel or unexpected calcification. Skull: Normal. Negative for fracture or focal lesion. Sinuses/Orbits: Orbits in the contents are within normal limits. Mucosal retention cysts are noted within the maxillary antra bilaterally. These are stable in appearance from the prior MRI. Other: None IMPRESSION: Chronic atrophic and ischemic changes without acute abnormality. Electronically Signed   By: Inez Catalina M.D.    On: 01/18/2022 19:47     Medications:    anticoagulant sodium citrate     ceFEPime (MAXIPIME) IV 1 g (01/19/22 2153)   vancomycin 750 mg (01/19/22 2032)    aspirin EC  81 mg Oral Daily   calcium acetate  667 mg Oral TID WC   Chlorhexidine Gluconate Cloth  6 each Topical Q0600   donepezil  10 mg Oral Daily   gabapentin  300 mg Oral QHS   heparin  5,000 Units Subcutaneous Q8H   hydrALAZINE  25 mg Oral BID   insulin aspart  0-9 Units Subcutaneous TID WC   insulin detemir  20 Units Subcutaneous QHS   levothyroxine  137 mcg Oral Q0600   loratadine  10 mg Oral QPM   losartan  25 mg Oral QPM   metoprolol succinate  50 mg Oral Daily   pantoprazole  40 mg Oral Daily   sevelamer carbonate  2.4 g Oral Q breakfast   simvastatin  20 mg Oral q1800   sodium chloride flush  3 mL Intravenous Q12H   vancomycin variable dose per unstable renal function (pharmacist dosing)   Does not apply See admin instructions   acetaminophen **OR** acetaminophen, albuterol, alteplase, alum & mag hydroxide-simeth, anticoagulant sodium citrate, heparin, heparin, lidocaine (PF), lidocaine-prilocaine, pentafluoroprop-tetrafluoroeth  Assessment/ Plan:  [name] is a 76 y.o.  male with a past medical history of hypertension, end-stage renal disease, on hemodialysis on Monday Wednesday Friday schedule, diabetes mellitus, CVA who was brought to the ER with chief complaint of weakness.  End-stage renal disease on hemodialysis.    Next dialysis treatment scheduled for Wednesday.  2. Anemia of chronic kidney disease  Lab Results  Component Value Date   HGB 11.0 (L) 01/20/2022    Hemoglobin at goal.  3. Secondary Hyperparathyroidism: with outpatient labs: PTH 422, phosphorus 6.4, calcium 9.1 on 12/29/2021.   Lab Results  Component Value Date   CALCIUM 8.6 (L) 01/20/2022   CAION 0.83 (LL) 12/10/2020   PHOS 1.9 (L) 01/20/2022    Calcium remains within acceptable goal however phosphorus decreased.  We will  hold calcium acetate.  4.  Hypertension with chronic kidney disease.  Home regimen includes hydralazine and losartan.  Currently receiving these medications along with metoprolol.  Blood pressure 156/77  5. Diabetes mellitus type II with chronic kidney disease insulin dependent. Home regimen includes NovoLog and Levemir. Most recent hemoglobin A1c is 8.1 on 01/18/2022.  Currently prescribed Januvia as well.   Glucose remains elevated at times.   LOS: 2   8/29/20231:51 PM

## 2022-01-20 NOTE — Plan of Care (Signed)

## 2022-01-20 NOTE — Progress Notes (Signed)
Mobility Specialist - Progress Note   01/20/22 1100  Mobility  Activity Stood at bedside;Transferred from bed to chair  Level of Assistance Moderate assist, patient does 50-74%  Assistive Device None  Distance Ambulated (ft) 10 ft  Activity Response Tolerated well  $Mobility charge 1 Mobility   Pt semi-supine in bed on RA upon arrival. Pt able to perform pericare, don gown, and scoot EOB ModA. PT STS and transfers to recliner with MinA and extra time. Pt needs VC for safety awareness. Pt left in recliner with needs in reach, family in room, and chair alarm on.   Gretchen Short  Mobility Specialist  01/20/22 11:12 AM

## 2022-01-21 ENCOUNTER — Inpatient Hospital Stay: Payer: Medicare HMO | Admitting: Radiology

## 2022-01-21 ENCOUNTER — Encounter: Payer: Self-pay | Admitting: Internal Medicine

## 2022-01-21 DIAGNOSIS — K810 Acute cholecystitis: Secondary | ICD-10-CM

## 2022-01-21 DIAGNOSIS — A419 Sepsis, unspecified organism: Secondary | ICD-10-CM | POA: Diagnosis not present

## 2022-01-21 HISTORY — PX: IR PERC CHOLECYSTOSTOMY: IMG2326

## 2022-01-21 LAB — RENAL FUNCTION PANEL
Albumin: 2.5 g/dL — ABNORMAL LOW (ref 3.5–5.0)
Anion gap: 13 (ref 5–15)
BUN: 44 mg/dL — ABNORMAL HIGH (ref 8–23)
CO2: 23 mmol/L (ref 22–32)
Calcium: 8.5 mg/dL — ABNORMAL LOW (ref 8.9–10.3)
Chloride: 89 mmol/L — ABNORMAL LOW (ref 98–111)
Creatinine, Ser: 7.39 mg/dL — ABNORMAL HIGH (ref 0.61–1.24)
GFR, Estimated: 7 mL/min — ABNORMAL LOW (ref >60)
Glucose, Bld: 107 mg/dL — ABNORMAL HIGH (ref 70–99)
Phosphorus: 2.3 mg/dL — ABNORMAL LOW (ref 2.5–4.6)
Potassium: 4.3 mmol/L (ref 3.5–5.1)
Sodium: 125 mmol/L — ABNORMAL LOW (ref 135–145)

## 2022-01-21 LAB — GLUCOSE, CAPILLARY
Glucose-Capillary: 54 mg/dL — ABNORMAL LOW (ref 70–99)
Glucose-Capillary: 172 mg/dL — ABNORMAL HIGH (ref 70–99)
Glucose-Capillary: 89 mg/dL (ref 70–99)
Glucose-Capillary: 77 mg/dL (ref 70–99)
Glucose-Capillary: 121 mg/dL — ABNORMAL HIGH (ref 70–99)

## 2022-01-21 MED ORDER — FENTANYL CITRATE (PF) 100 MCG/2ML IJ SOLN
INTRAMUSCULAR | Status: AC | PRN
  Administered 2022-01-21: 25 ug via INTRAVENOUS

## 2022-01-21 MED ORDER — METRONIDAZOLE 500 MG/100ML IV SOLN
500.0000 mg | Freq: Two times a day (BID) | INTRAVENOUS | Status: DC
  Administered 2022-01-21 – 2022-01-27 (×13): 500 mg via INTRAVENOUS
  Filled 2022-01-21 (×15): qty 100

## 2022-01-21 MED ORDER — MIDAZOLAM HCL 2 MG/2ML IJ SOLN
INTRAMUSCULAR | Status: AC
  Filled 2022-01-21: qty 2

## 2022-01-21 MED ORDER — HEPARIN SODIUM (PORCINE) 5000 UNIT/ML IJ SOLN
5000.0000 [IU] | Freq: Three times a day (TID) | INTRAMUSCULAR | Status: DC
  Administered 2022-01-21 – 2022-02-02 (×35): 5000 [IU] via SUBCUTANEOUS
  Filled 2022-01-21 (×36): qty 1

## 2022-01-21 MED ORDER — IOHEXOL 350 MG/ML SOLN
4.0000 mL | Freq: Once | INTRAVENOUS | Status: AC | PRN
  Administered 2022-01-21: 4 mL

## 2022-01-21 MED ORDER — MORPHINE SULFATE (PF) 2 MG/ML IV SOLN
1.0000 mg | Freq: Once | INTRAVENOUS | Status: AC
  Administered 2022-01-21: 1 mg via INTRAVENOUS
  Filled 2022-01-21: qty 1

## 2022-01-21 MED ORDER — FENTANYL CITRATE (PF) 100 MCG/2ML IJ SOLN
INTRAMUSCULAR | Status: AC
  Filled 2022-01-21: qty 2

## 2022-01-21 MED ORDER — SODIUM CHLORIDE 0.9 % IV SOLN
2.0000 g | INTRAVENOUS | Status: DC
  Administered 2022-01-21 – 2022-01-27 (×7): 2 g via INTRAVENOUS
  Filled 2022-01-21 (×5): qty 20
  Filled 2022-01-21 (×2): qty 2

## 2022-01-21 MED ORDER — LIDOCAINE HCL 1 % IJ SOLN
INTRAMUSCULAR | Status: AC
  Administered 2022-01-21: 10 mL
  Filled 2022-01-21: qty 20

## 2022-01-21 MED ORDER — DEXTROSE 50 % IV SOLN
INTRAVENOUS | Status: AC
  Administered 2022-01-21: 50 mL
  Filled 2022-01-21: qty 50

## 2022-01-21 NOTE — Progress Notes (Signed)
Physical Therapy Treatment Patient Details Name: Joseph Mcpherson MRN: [id number] DOB: Jun 26, 1945 Today's Date: 01/21/2022   History of Present Illness Joseph Mcpherson is a 76 y.o. 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    PT Comments    Pt received upright in bed agreeable to PT. Pt slow moving but able to perform all functional mobility with supervision. Pt progressing in tolerance for activity demonstrating LE therex with reports of fatigue during but able to complete ambulation of total ~160' with step through gait with intermittent variable step lengths and light SUE support on guard rail in hallway for steadying. Standing rest break at sink performed for pt to wash face with warm water without evidence of imbalance. Pt returning to supine in bed stating confusion about his care. Pt aware he is in [place] in the hospital but states he does not know why besides feeling weak and tired. Pt educated to advocate for himself and ask questions about his medical care when confused or if he has general questions to understand his situation. Referred to attending MD and RN for updates. Aware that he does have a procedure coming up. All needs in reach, deferred stairs this date as RN stated transport team will be by soon to escort pt to procedure. D/c recs remain appropriate.   Recommendations for follow up therapy are one component of a multi-disciplinary discharge planning process, led by the attending physician.  Recommendations may be updated based on patient status, additional functional criteria and insurance authorization.  Follow Up Recommendations  Home health PT     Assistance Recommended at Discharge Frequent or constant Supervision/Assistance  Patient can return home with the following A little help with walking and/or transfers;A little help with  bathing/dressing/bathroom;Assistance with cooking/housework;Assist for transportation;Help with stairs or ramp for entrance   Equipment Recommendations  Rolling walker (2 wheels)    Recommendations for Other Services       Precautions / Restrictions Precautions Precautions: Fall Restrictions Weight Bearing Restrictions: No     Mobility  Bed Mobility Overal bed mobility: Needs Assistance Bed Mobility: Supine to Sit, Sit to Supine     Supine to sit: HOB elevated, Supervision Sit to supine: Supervision, HOB elevated     Patient Response: Cooperative  Transfers Overall transfer level: Needs assistance Equipment used: None Transfers: Sit to/from Stand Sit to Stand: Min guard                Ambulation/Gait Ambulation/Gait assistance: Min guard, Supervision Gait Distance (Feet): 160 Feet Assistive device: None Gait Pattern/deviations: Step-to pattern, Decreased step length - right, Decreased step length - left, Drifts right/left       General Gait Details: Decreased unsteadiness this date. INtermittent use of SUE on guard rail in hallway for gentle steadying. Progressed from minguard to supervision by end of bout.   Stairs             Wheelchair Mobility    Modified Rankin (Stroke Patients Only)       Balance Overall balance assessment: Needs assistance Sitting-balance support: Feet supported, Bilateral upper extremity supported, No upper extremity supported Sitting balance-Leahy Scale: Good Sitting balance - Comments: no posterior bias this date. performing seated therex without LOB     Standing balance-Leahy Scale: Fair  Cognition Arousal/Alertness: Awake/alert Behavior During Therapy: WFL for tasks assessed/performed Overall Cognitive Status: Within Functional Limits for tasks assessed                                 General Comments: Reports confusion with hospital stay due to prior  CVA causing cognitive deficits        Exercises General Exercises - Lower Extremity Ankle Circles/Pumps: AROM, Strengthening, Both, 10 reps, Supine Long Arc Quad: AROM, Strengthening, Both, 10 reps, Seated Hip Flexion/Marching: AROM, Strengthening, Both, 10 reps, Seated Other Exercises Other Exercises: educating pt to ask questions about his medical care if he is confused or not understanding his care.    General Comments        Pertinent Vitals/Pain Pain Assessment Pain Assessment: No/denies pain    Home Living                          Prior Function            PT Goals (current goals can now be found in the care plan section) Acute Rehab PT Goals Patient Stated Goal: go to [facility] for his vacation PT Goal Formulation: With patient Time For Goal Achievement: 02/02/22 Potential to Achieve Goals: Poor Progress towards PT goals: Progressing toward goals    Frequency    Min 2X/week      PT Plan Current plan remains appropriate    Co-evaluation              AM-PAC PT "6 Clicks" Mobility   Outcome Measure  Help needed turning from your back to your side while in a flat bed without using bedrails?: A Little Help needed moving from lying on your back to sitting on the side of a flat bed without using bedrails?: A Little Help needed moving to and from a bed to a chair (including a wheelchair)?: A Little Help needed standing up from a chair using your arms (e.g., wheelchair or bedside chair)?: A Little Help needed to walk in hospital room?: A Little Help needed climbing 3-5 steps with a railing? : A Lot 6 Click Score: 17    End of Session Equipment Utilized During Treatment: Gait belt Activity Tolerance: Patient tolerated treatment well Patient left: in bed;with call bell/phone within reach;with bed alarm set Nurse Communication: Mobility status PT Visit Diagnosis: Unsteadiness on feet (R26.81);Muscle weakness (generalized) (M62.81)     Time:  1610-9604 PT Time Calculation (min) (ACUTE ONLY): 23 min  Charges:  $Therapeutic Exercise: 23-37 mins                     Joseph Mcpherson M. Fairly IV, PT, DPT Physical Therapist- [facility]  01/21/2022, 10:31 AM

## 2022-01-21 NOTE — Progress Notes (Signed)
Patient clinically stable post 10FR chole tube placement per Dr Serafina Royals, local anesthetic along with Fentanyl 25 mcg IV used for procedure secondarily to sbp. Awake/alert and oriented post procedure. Report given at bedside to care nurse post procedure/recovery.

## 2022-01-21 NOTE — H&P (Signed)
Chief Complaint: Patient was seen in consultation today for acute cholecystitis  at the request of Edison Simon, [place]  Referring Physician(s): Edison Simon, PA  Supervising Physician: Ruthann Cancer  Patient Status: [place] - In-pt  History of Present Illness: Joseph Mcpherson is a 76 y.o. male who presented to [facility] ED 01/18/2022 complaining of fever and weakness for approximately 48 hours PTA with Tmax of 101.  Patient is ESRD on HD and was unable to attend dialysis due to illness.  ED work-up showed WBC 34.6, hemoglobin 12.1, sodium 128, potassium 6.7, BUN of 71 and creatinine of 13.8.  CT CAP was concerning for acute cholecystitis.  Due to patient's comorbidities and current condition he is not a surgical candidate.  Patient has been referred to IR by Edison Simon, PA, for percutaneous cholecystostomy tube placement.  Procedure was approved by Dr. Serafina Royals, [place].  Past Medical History:  Diagnosis Date   Clotting disorder ([place])    per daughter, "when patient travels, his feet swell"   Diabetes mellitus without complication ([place])    type II   Diabetic eye exam ([place]) 12/2019   ESRD (end stage renal disease) ([place])    MWF- [place]   GERD (gastroesophageal reflux disease)    [name] (hard of hearing)    Hyperlipidemia    Hypertension    Hypothyroidism    Thyroid disease     Past Surgical History:  Procedure Laterality Date   AV FISTULA PLACEMENT Right 10/22/2017   Procedure: ARTERIOVENOUS (AV) BRACHIOCEPHALIC FISTULA CREATION RIGHT UPPER ARM;  Surgeon: Conrad [place], MD;  Location: [place];  Service: Vascular;  Laterality: Right;   AV FISTULA PLACEMENT Left 02/03/2019   Procedure: INSERTION OF ARTERIOVENOUS (AV) GORE-TEX GRAFT ARM (BRACHIAL AXILLARY );  Surgeon: [name], MD;  Location: [facility];  Service: Vascular;  Laterality: Left;   AV FISTULA PLACEMENT Left 12/10/2020   Procedure: LEFT ARM BRACHIOCEPHALIC ARTERIOVENOUS (AV) FISTULA CREATION;  Surgeon: [name], MD;   Location: [place];  Service: Vascular;  Laterality: Left;   UPPER EXTREMITY ANGIOGRAPHY Left 09/19/2019   Procedure: UPPER EXTREMITY ANGIOGRAPHY;  Surgeon: [name], MD;  Location: [place] [facility] LAB;  Service: Cardiovascular;  Laterality: Left;    Allergies: Patient has no known allergies.  Medications: Prior to Admission medications   Medication Sig Start Date End Date Taking? Authorizing Provider  aspirin EC 81 MG tablet Take 81 mg by mouth daily. Swallow whole.   Yes [provider]  B Complex-C-Folic Acid (RENA-VITE RX) 1 MG TABS Take 1 tablet by mouth daily.  12/07/18  Yes [provider]  calcium acetate (PHOSLO) 667 MG capsule TAKE 2 CAPSULES BY MOUTH 3 TIMES A DAY WITH MEAL 05/19/21  Yes Masoud, Viann Shove, MD  Cholecalciferol (VITAMIN D3) 5000 units TABS Take 5,000 Units by mouth daily.    Yes [provider]  docusate sodium (COLACE) 100 MG capsule TAKE 1 CAPSULE BY MOUTH TWICE A DAY 10/06/21  Yes Masoud, Viann Shove, MD  donepezil (ARICEPT) 10 MG tablet TAKE 1 TABLET BY MOUTH EVERY DAY 04/22/21  Yes Masoud, Viann Shove, MD  gabapentin (NEURONTIN) 300 MG capsule TAKE 1 CAPSULE BY MOUTH EVERY DAY AT BEDTIME AS DIRECTED 11/05/21  Yes Masoud, Viann Shove, MD  hydrALAZINE (APRESOLINE) 50 MG tablet Take 1 tablet (50 mg total) by mouth 3 (three) times daily. Patient taking differently: Take 25 mg by mouth in the morning and at bedtime. 10/16/19  Yes Masoud, Viann Shove, MD  insulin aspart (NOVOLOG) 100 UNIT/ML FlexPen  Inject 10 Units into the skin at bedtime.   Yes [provider]  insulin detemir (LEVEMIR) 100 UNIT/ML injection Inject 20 Units into the skin at bedtime.   Yes [provider]  levocetirizine (XYZAL) 5 MG tablet TAKE 1 TABLET BY MOUTH EVERY DAY IN THE EVENING 01/07/21  Yes Masoud, Viann Shove, MD  levothyroxine (SYNTHROID) 137 MCG tablet TAKE 1 TABLET BY MOUTH EVERY DAY 03/24/21  Yes Masoud, Viann Shove, MD  losartan (COZAAR) 25 MG tablet TAKE 1 TABLET BY MOUTH  EVERY EVENING 04/22/21  Yes Masoud, Viann Shove, MD  metoprolol succinate (TOPROL-XL) 50 MG 24 hr tablet TAKE 1 TABLET BY MOUTH EVERY DAY 04/22/21  Yes Masoud, Viann Shove, MD  Multiple Vitamins-Minerals (MULTIVITAMIN WITH MINERALS) tablet Take 1 tablet by mouth daily.   Yes [provider]  pantoprazole (PROTONIX) 40 MG tablet TAKE 1 TABLET BY MOUTH EVERY DAY 07/09/21  Yes Masoud, Viann Shove, MD  sevelamer carbonate (RENVELA) 2.4 g PACK Take 2.4 g by mouth daily. 04/26/19  Yes [provider]  simvastatin (ZOCOR) 20 MG tablet TAKE 1 TABLET BY MOUTH EVERY DAY IN THE EVENING 03/03/21  Yes Masoud, Viann Shove, MD  sitaGLIPtin (JANUVIA) 50 MG tablet Take 50 mg by mouth every evening.    Yes [provider]  albuterol (PROVENTIL) (2.5 MG/3ML) 0.083% nebulizer solution Take 3 mLs (2.5 mg total) by nebulization every 6 (six) hours as needed for wheezing or shortness of breath. 10/28/20   [name], MD  BD PEN NEEDLE NANO 2ND GEN 32G X 4 MM MISC USE AS DIRECTED 02/24/21   [name], MD  Continuous Blood Gluc Sensor (FREESTYLE LIBRE 14 DAY SENSOR) MISC APPLY EVERY 14 (FOURTEEN) DAYS. 11/07/21   [name], MD     Family History  Problem Relation Age of Onset   Diabetes Mother    Hyperlipidemia Mother    Hypertension Father     Social History   Socioeconomic History   Marital status: Married    Spouse name: Not on file   Number of children: Not on file   Years of education: 12   Highest education level: 12th grade  Occupational History   Occupation: Retired  Tobacco Use   Smoking status: Never   Smokeless tobacco: Current    Types: Snuff  Vaping Use   Vaping Use: Never used  Substance and Sexual Activity   Alcohol use: No   Drug use: No   Sexual activity: Not Currently  Other Topics Concern   Not on file  Social History Narrative   Not on file   Social Determinants of Health   Financial Resource Strain: Low Risk  (06/19/2021)   Overall Financial Resource Strain (CARDIA)     Difficulty of Paying Living Expenses: Not hard at all  Food Insecurity: No Food Insecurity (06/19/2021)   Hunger Vital Sign    Worried About Running Out of Food in the Last Year: Never true    [place] in the Last Year: Never true  Transportation Needs: No Transportation Needs (06/19/2021)   PRAPARE - Hydrologist (Medical): No    Lack of Transportation (Non-Medical): No  Physical Activity: Sufficiently Active (06/19/2021)   Exercise Vital Sign    Days of Exercise per Week: 7 days    Minutes of Exercise per Session: 30 min  Stress: No Stress Concern Present (06/19/2021)   [place]    Feeling of Stress : Not at all  Social Connections: Socially Integrated (06/19/2021)   Social Connection and Isolation Panel [NHANES]    Frequency of Communication with Friends and Family: More than three times a week    Frequency of Social Gatherings with Friends and Family: More than three times a week    Attends Religious Services: More than 4 times per year    Active Member of [employer] or Organizations: Yes    Attends Music therapist: More than 4 times per year    Marital Status: Married     Review of Systems: A 12 point ROS discussed and pertinent positives are indicated in the HPI above.  All other systems are negative.  Review of Systems  Unable to perform ROS: Acuity of condition    Vital Signs: BP (!) 148/55 (BP Location: Right Arm)   Pulse 63   Temp 97.7 F (36.5 C)   Resp 17   Ht '5\' 4"'$  (1.626 m)   Wt 156 lb 1.4 oz (70.8 kg)   SpO2 99%   BMI 26.79 kg/m    Physical Exam Vitals reviewed.  Constitutional:      General: He is not in acute distress.    Appearance: He is ill-appearing.  HENT:     Head: Normocephalic and atraumatic.     Mouth/Throat:     Mouth: Mucous membranes are dry.     Pharynx: Oropharynx is clear.  Eyes:     Extraocular Movements: Extraocular  movements intact.     Pupils: Pupils are equal, round, and reactive to light.  Cardiovascular:     Rate and Rhythm: Normal rate and regular rhythm.     Pulses: Normal pulses.     Heart sounds: Normal heart sounds.  Pulmonary:     Effort: Pulmonary effort is normal. No respiratory distress.     Breath sounds: Normal breath sounds.  Abdominal:     General: Bowel sounds are normal. There is no distension.     Palpations: Abdomen is soft.     Tenderness: There is no abdominal tenderness. There is no guarding.  Musculoskeletal:     Right lower leg: No edema.     Left lower leg: No edema.  Skin:    General: Skin is warm and dry.  Neurological:     Mental Status: He is alert. He is disoriented.  Psychiatric:        Mood and Affect: Mood normal.     Imaging: CT CHEST ABDOMEN PELVIS W CONTRAST  Result Date: 01/20/2022 CLINICAL DATA:  Sepsis, fever of undetermined source. EXAM: CT CHEST, ABDOMEN, AND PELVIS WITH CONTRAST TECHNIQUE: Multidetector CT imaging of the chest, abdomen and pelvis was performed following the standard protocol during bolus administration of intravenous contrast. RADIATION DOSE REDUCTION: This exam was performed according to the departmental dose-optimization program which includes automated exposure control, adjustment of the mA and/or kV according to patient size and/or use of iterative reconstruction technique. CONTRAST:  157m OMNIPAQUE IOHEXOL 300 MG/ML  SOLN COMPARISON:  None Available. FINDINGS: CT CHEST FINDINGS Cardiovascular: No significant vascular findings. Normal heart size. No pericardial effusion. Large-bore venous catheter in the RIGHT atrium Mediastinum/Nodes: No axillary or supraclavicular adenopathy. No mediastinal or hilar adenopathy. No pericardial fluid. Esophagus normal. Lungs/Pleura: No suspicious pulmonary nodules. Normal pleural. Airways normal. No pneumonia. Musculoskeletal: No aggressive osseous lesion. CT ABDOMEN AND PELVIS FINDINGS Hepatobiliary:  The gallbladder wall is thickened and hazy. Small amount of pericholecystic fluid. There is irregular gallbladder wall enhancement. There is haziness within the peritoneal fat adjacent  the gallbladder fundus. The gallbladder is mildly distended at 4.6 cm. No radiodense gallstones are evident. Common bile duct is normal caliber. No intrahepatic duct dilatation. Pancreas: Pancreas is normal. No ductal dilatation. No pancreatic inflammation. Spleen: Normal spleen Adrenals/urinary tract: Adrenal glands normal. Bilateral renal cortical thinning. Benign-appearing cysts in the kidneys. Ureters bladder normal. The bladder is thick-walled but predominantly collapsed. Stomach/Bowel: Stomach, small bowel, appendix, and cecum are normal. The colon and rectosigmoid colon are normal. Vascular/Lymphatic: Abdominal aorta is normal caliber with atherosclerotic calcification. There is no retroperitoneal or periportal lymphadenopathy. No pelvic lymphadenopathy. Reproductive: Unremarkable Other: No free fluid. Musculoskeletal: No aggressive osseous lesion. IMPRESSION: Chest Impression: 1. No evidence of pulmonary infection. Abdomen / Pelvis Impression: 1. Gallbladder wall thickening with pericholecystic fluid and irregular gallbladder wall enhancement. Findings are concerning for ACUTE CHOLECYSTITIS. No radiodense gallstones are noted (potential acalculous cholecystitis versus non radiodense gallstones). 2. Bilateral renal cortical thinning.  Benign bilateral renal cysts. 3. Bladder wall thickening. These results will be called to the ordering clinician or representative by the Radiologist Assistant, and communication documented in the PACS or [employer]. Electronically Signed   By: Suzy Bouchard M.D.   On: 01/20/2022 21:06   CT HEAD WO CONTRAST (5MM)  Result Date: 01/18/2022 CLINICAL DATA:  Altered mental status EXAM: CT HEAD WITHOUT CONTRAST TECHNIQUE: Contiguous axial images were obtained from the base of the skull  through the vertex without intravenous contrast. RADIATION DOSE REDUCTION: This exam was performed according to the departmental dose-optimization program which includes automated exposure control, adjustment of the mA and/or kV according to patient size and/or use of iterative reconstruction technique. COMPARISON:  09/11/2013, MRI from 05/04/2019 FINDINGS: Brain: No evidence of acute infarction, hemorrhage, hydrocephalus, extra-axial collection or mass lesion/mass effect. Mild atrophic changes are noted. Changes of prior right occipital infarct are again seen. The previously seen ischemia in the posterior pons is not well appreciated on this exam. Vascular: No hyperdense vessel or unexpected calcification. Skull: Normal. Negative for fracture or focal lesion. Sinuses/Orbits: Orbits in the contents are within normal limits. Mucosal retention cysts are noted within the maxillary antra bilaterally. These are stable in appearance from the prior MRI. Other: None IMPRESSION: Chronic atrophic and ischemic changes without acute abnormality. Electronically Signed   By: Inez Catalina M.D.   On: 01/18/2022 19:47   DG Chest Port 1 View  Result Date: 01/18/2022 CLINICAL DATA:  Questionable sepsis. EXAM: PORTABLE CHEST 1 VIEW COMPARISON:  May 04, 2019 FINDINGS: A double lumen catheter terminates in the right side of the heart. No pneumothorax. No overt edema. No pulmonary nodules or masses. No focal infiltrate. The cardiac silhouette is prominent. The hila and mediastinum are unremarkable. IMPRESSION: 1. Double lumen right central line as above. 2. The cardiac silhouette is prominent but poorly evaluated on an AP portable film. 3. No focal infiltrate. Electronically Signed   By: Dorise Bullion III M.D.   On: 01/18/2022 12:16    Labs:  CBC: Recent Labs    01/18/22 1151 01/19/22 0640 01/20/22 0438  WBC 34.6* 22.8* 17.7*  HGB 12.1* 10.7* 11.0*  HCT 38.0* 33.9* 33.7*  PLT 240 211 218    COAGS: Recent Labs     01/18/22 1151  INR 1.2  APTT 35    BMP: Recent Labs    01/18/22 1151 01/19/22 0640 01/20/22 0438 01/21/22 0458  NA 128* 134* 133* 125*  K 6.7* 4.3 3.4* 4.3  CL 94* 93* 95* 89*  CO2 20* '27 26 23  '$ GLUCOSE  233* 214* 104* 107*  BUN 71* 46* 30* 44*  CALCIUM 9.8 8.8* 8.6* 8.5*  CREATININE 13.80* 9.09* 5.85* 7.39*  GFRNONAA 3* 6* 9* 7*    LIVER FUNCTION TESTS: Recent Labs    01/18/22 1151 01/19/22 0640 01/20/22 0438 01/21/22 0458  BILITOT 1.5*  --  0.6  --   AST 11*  --  30  --   ALT 13  --  21  --   ALKPHOS 106  --  92  --   PROT 8.2*  --  7.1  --   ALBUMIN 3.5 3.1* 2.8*  3.0* 2.5*    TUMOR MARKERS: No results for input(s): "AFPTM", "CEA", "CA199", "CHROMGRNA" in the last 8760 hours.  Assessment and Plan:  76 year old male with PMH of ESRD on HD and altered mental status referred to IR for percutaneous cholecystostomy drain placement for acute cholecystitis.  Pt sleeping in bed with wife at bedside. He responds to verbal stimuli but is sleepy. He is in no distress.  Wife states pt is NPO per order.  No thinners on MAR.   Risks and benefits discussed with the patient's wife and daughter including, but not limited to bleeding, infection, gallbladder perforation, bile leak, sepsis or even death.  All of the patient's wife's and daughter's questions were answered, patient is agreeable to proceed. Consent signed and in chart.   Thank you for this interesting consult.  I greatly enjoyed meeting Rochell Mabie and look forward to participating in their care.  A copy of this report was sent to the requesting provider on this date.  Electronically Signed: Tyson Alias, NP 01/21/2022, 9:35 AM   I spent a total of 20 minutes in face to face in clinical consultation, greater than 50% of which was counseling/coordinating care for acute cholecystitis.

## 2022-01-21 NOTE — Progress Notes (Addendum)
Hypoglycemia event.   1102: 54 blood sugar. Patient Asymptomatic. Patient is NPO for procedure. MD notified.   0848: per hypoglycemia protocol, D50 25g administered IV. Will continue to monitor.   Recheck 172. No further actions required.

## 2022-01-21 NOTE — Progress Notes (Signed)
PROGRESS NOTE    Joseph Mcpherson  QMV:[id number] DOB: 07/05/1945 DOA: 01/18/2022 PCP: [name], MD  [id number]  LOS: 3 days   Brief hospital course: No notes on file  Assessment & Plan: Joseph Mcpherson is a 76 y.o. 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  History is mostly obtained from the patient's wife as he is lethargic and difficult to arouse.  Apparently he had been having fevers up to 101 F for which she had gone to dialysis 2 days ago, but was not dialyzed and returned home. His wife notes that he had slept all day yesterday and was so weak he was unable to stand or ambulate without assistance.   He was noted to have some urinary incontinence prior to arrival.     Acute cholecystitis --dx by CT c/a/p.  Pt denied abdominal pain. --GenSurg consulted, rec chole tube --started on vanc/cefepime on presentation. Plan: --IR for chole tube today --switch abx to ceftriaxone and flagyl today  Sepsis --On admission patient noted to be febrile up to 101.4 F with mild tachypnea, and labs significant for WBC 34.6 with lactic acid reassuring at 1.  Source acute cholecystitis   Hyperkalemia due to missing HD --  Initial potassium 6.7 with signs of T wave peaking noted on EKG.   --Patient has been given Lokelma 10g p.o., albuterol 10 mg neb, calcium gluconate 1 g IV, amp of D50, and 10 units of insulin. --HD per nephrology   ESRD on HD MWF Patient normally dialyzes Monday, Wednesday, Friday, but missed dialysis Friday due to not feeling well.   -HD per nephrology Dr Holley Raring   Weakness Patient noted to be grossly weak needing assistance to ambulate. - CT scan of the brain negative   -PT/OT to evaluate and treat   Essential hypertension Home blood pressure medication regimen includes hydralazine 25 mg twice daily, losartan 25 mg daily, and metoprolol 50 mg  daily. -Continue current regimen   Hyponatremia, acute --Suspect patient likely at least partially fluid overloaded recent missed dialysis. --monitor Na   Anemia chronic kidney disease   Alzheimer's disease -Delirium precautions -Continue donepezil   Insulin-dependent diabetes mellitus type 2 Home medication regimen includes Januvia 50 mg daily, NovoLog 10 units subcu nightly, and Levemir 20 units nightly. --hypoglycemic today due to NPO Plan: --hold long-acting insulin --SSI   Hypothyroidism -Continue levothyroxine   Mixed hyperlipidemia -Continue statin   History of CVA -Continue statin and aspirin   GERD -Continue Protonix   DVT prophylaxis: Heparin SQ Code Status: Full code  Family Communication: wife updated at bedside today Level of care: Telemetry Medical Dispo:   The patient is from: home Anticipated d/c is to: home Anticipated d/c date is: 2-3 days   Subjective and Interval History:  Per wife, pt was more alert today.  Pt denied ab pain.  Going for chole tube today.   Objective: Vitals:   01/21/22 1600 01/21/22 1630 01/21/22 1700 01/21/22 1730  BP: (!) 122/97 (!) 170/56 (!) 175/81 (!) 185/71  Pulse: (!) 52 (!) 57 (!) 58 (!) 56  Resp: '13 11 16 15  '$ Temp:      TempSrc:      SpO2: 100% 100% 100% 100%  Weight:      Height:        Intake/Output Summary (Last 24 hours) at 01/21/2022 1751 Last data filed at 01/20/2022 1900 Gross per 24  hour  Intake 240 ml  Output --  Net 240 ml   Filed Weights   01/18/22 1135 01/19/22 1400  Weight: 65.8 kg 70.8 kg    Examination:   Constitutional: NAD, lethargic HEENT: conjunctivae and lids normal, EOMI CV: No cyanosis.   RESP: normal respiratory effort, on RA Extremities: No effusions, edema in BLE SKIN: warm, dry   Data Reviewed: I have personally reviewed labs and imaging studies  Time spent: 50 minutes  Enzo Bi, MD [facility] If 7PM-7AM, please contact night-coverage 01/21/2022,  5:51 PM

## 2022-01-21 NOTE — Care Management Important Message (Signed)
Important Message  Patient Details  Name: Joseph Mcpherson MRN: [id number] Date of Birth: [birth year]   Medicare Important Message Given:  N/A - LOS <3 / Initial given by admissions     Joseph Mcpherson 01/21/2022, 8:40 AM

## 2022-01-21 NOTE — Progress Notes (Signed)
Hd tx of 3.5hrs completed. 83.5L total vol processed. No complications. Report given to jacqueline ciriello, rn. Total uf removed: 1558m Post hd wt: 69.5kgs Post hd v/s : 97.8 164/74(93) 61 18 100%

## 2022-01-21 NOTE — TOC Initial Note (Signed)
Transition of Care [facility]) - Initial/Assessment Note    Patient Details  Name: Joseph Mcpherson MRN: [id number] Date of Birth: [birth year]  [facility]) CM/SW Contact:    Laurena Slimmer, RN Phone Number: 01/21/2022, 4:19 PM  Clinical Narrative:                 RW requested. Previously active with [facility]. Referral sent to [facility].         Patient Goals and CMS Choice        Expected Discharge Plan and Services                                                Prior Living Arrangements/Services                       Activities of Daily Living      Permission Sought/Granted                  Emotional Assessment              Admission diagnosis:  Hyperkalemia [E87.5] Febrile illness [R50.9] Sepsis ([place]) [A41.9] Patient Active Problem List   Diagnosis Date Noted   Sepsis ([place]) 01/18/2022   Hyperkalemia 01/18/2022   Generalized weakness 01/18/2022   Hyponatremia 01/18/2022   Acute bronchitis and bronchiolitis 06/16/2021   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   Allergies 10/28/2020   Gastroesophageal reflux disease with esophagitis 07/18/2020   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   Annual physical exam 03/17/2020   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   Acute CVA (cerebrovascular accident) ([place]) 05/04/2019   Hemodialysis status ([place]) 05/04/2019   History of CVA (cerebrovascular accident) 35/32/9924   Complication of vascular access for dialysis 04/27/2019   Surgery follow-up examination 03/09/2019   ESRD (end stage renal disease) on dialysis ([place]) 02/09/2018   Anemia in chronic kidney disease 02/09/2018   Iron deficiency anemia, unspecified 02/09/2018   Pruritus, unspecified 02/09/2018   Secondary hyperparathyroidism of renal origin ([place]) 02/09/2018   Peripheral vascular disease ([place]) 03/16/2016   Headache, unspecified 03/16/2016   Diabetic neuropathy associated  with diabetes mellitus due to underlying condition ([place]) 03/16/2016   Mixed hyperlipidemia 07/15/2015   Hypothyroidism 07/14/2015   Hearing impaired 03/19/2015   Type 2 diabetes mellitus with diabetic neuropathy, unspecified ([place]) 03/13/2015   Neurocognitive deficits 03/13/2015   Essential hypertension 11/27/2014   Insulin dependent type 2 diabetes mellitus ([place]) 11/19/2014   Pulmonary embolism ([place]) 09/13/2014   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[place]) 05/31/2014   Constipation 10/14/2013   PCP:  [name], MD Pharmacy:   [id number] [place]Phone: [phone]: [phone]    Social Determinants of Health (SDOH) Interventions    Readmission Risk Interventions     No data to display

## 2022-01-21 NOTE — Inpatient Diabetes Management (Signed)
Inpatient Diabetes Program Recommendations  AACE/ADA: New Consensus Statement on Inpatient Glycemic Control (2015)  Target Ranges:  Prepandial:   less than 140 mg/dL      Peak postprandial:   less than 180 mg/dL (1-2 hours)      Critically ill patients:  140 - 180 mg/dL   Lab Results  Component Value Date   GLUCAP 172 (H) 01/21/2022   HGBA1C 8.1 (H) 01/18/2022    Review of Glycemic Control  Latest Reference Range & Units 01/20/22 22:54 01/21/22 08:29 01/21/22 09:43  Glucose-Capillary 70 - 99 mg/dL 190 (H)  Semglee 20 units  54 (L) 172 (H)  (H): Data is abnormally high (L): Data is abnormally low  Diabetes history: DM2  Outpatient Diabetes medications:  Lantus 20 units QHS, Novolog 10 units QHS, Januvia 50 mg QD  Current orders for Inpatient glycemic control: Novolog 0-9 units TID  Inpatient Diabetes Program Recommendations:    Basal insulin has been discontinued.  Please consider adding back 50% of home dose tonight:  Semglee 10 units QHS.  Will continue to follow while inpatient.  Thank you, Reche Dixon, MSN, [place] Diabetes Coordinator Inpatient Diabetes Program [phone] (team pager from 8a-5p)

## 2022-01-21 NOTE — Plan of Care (Signed)
  Problem: Education: Goal: Ability to describe self-care measures that may prevent or decrease complications (Diabetes Survival Skills Education) will improve Outcome: Progressing Goal: Individualized Educational Video(s) Outcome: Progressing   Problem: Coping: Goal: Ability to adjust to condition or change in health will improve Outcome: Progressing   

## 2022-01-21 NOTE — Procedures (Signed)
Interventional Radiology Procedure Note  Procedure: Image guided percutaneous cholecystostomy tube placement  Findings: Please refer to procedural dictation for full description. 10.2 Fr transhepatic cholecystostomy tube to bag drainage.  Translucent, purulent fluid aspirated, sample sent for culture.  Complications: None immediate  Estimated Blood Loss: < 5 mL  Recommendations: Keep to bag drainage. Follow up cultures.  Ruthann Cancer, MD Pager: [phone]

## 2022-01-21 NOTE — Consult Note (Signed)
[facility] SURGICAL CONSULTATION NOTE (initial) - cpt: 96283   HISTORY OF PRESENT ILLNESS (HPI):  76 y.o. male presented to [facility] ED initially on 08/27 for fever and weakness at home. Patient's wife reported symptoms had persisted for the course of the last 48 hours prior to presentation. T-max 101F at home. They tried tylenol at home without any improvements at home. They attempted to go to dialysis but could not have runs secondary to fevers. Work up in the ED revealed WBC 34.6, hemoglobin 12.1, sodium 128, potassium 6.7, BUN 71, creatinine 13.8, glucose 233, total bilirubin 1.5, and lactic acid 1. He was started empirically on vancomycin and cefepime. He continued to have fevers over the course of the admission. CT Chest/Abdomen/Pelvis was obtained and was concerning for acute cholecystitis.   This morning, he continues to be altered, non-contributory. He does not endorse pain but is having hiccups. Otherwise, unable to obtain further history.  Surgery is consulted by hospitalist physician Dr. Fritzi Mandes, MD in this context for evaluation and management of acute cholecystitis.  PAST MEDICAL HISTORY (PMH):  Past Medical History:  Diagnosis Date   Clotting disorder ([place])    per daughter, "when patient travels, his feet swell"   Diabetes mellitus without complication ([place])    type II   Diabetic eye exam ([place]) 12/2019   ESRD (end stage renal disease) ([place])    MWF- [place]   GERD (gastroesophageal reflux disease)    [name] (hard of hearing)    Hyperlipidemia    Hypertension    Hypothyroidism    Thyroid disease      PAST SURGICAL HISTORY ([place]):  Past Surgical History:  Procedure Laterality Date   AV FISTULA PLACEMENT Right 10/22/2017   Procedure: ARTERIOVENOUS (AV) BRACHIOCEPHALIC FISTULA CREATION RIGHT UPPER ARM;  Surgeon: Conrad Natalia, MD;  Location: [facility];  Service: Vascular;  Laterality: Right;   AV FISTULA PLACEMENT Left 02/03/2019   Procedure: INSERTION OF  ARTERIOVENOUS (AV) GORE-TEX GRAFT ARM (BRACHIAL AXILLARY );  Surgeon: [name], MD;  Location: [facility];  Service: Vascular;  Laterality: Left;   AV FISTULA PLACEMENT Left 12/10/2020   Procedure: LEFT ARM BRACHIOCEPHALIC ARTERIOVENOUS (AV) FISTULA CREATION;  Surgeon: [name], MD;  Location: [facility];  Service: Vascular;  Laterality: Left;   UPPER EXTREMITY ANGIOGRAPHY Left 09/19/2019   Procedure: UPPER EXTREMITY ANGIOGRAPHY;  Surgeon: [name], MD;  Location: [facility];  Service: Cardiovascular;  Laterality: Left;     MEDICATIONS:  Prior to Admission medications   Medication Sig Start Date End Date Taking? Authorizing Provider  aspirin EC 81 MG tablet Take 81 mg by mouth daily. Swallow whole.   Yes [provider]  B Complex-C-Folic Acid (RENA-VITE RX) 1 MG TABS Take 1 tablet by mouth daily.  12/07/18  Yes [provider]  calcium acetate (PHOSLO) 667 MG capsule TAKE 2 CAPSULES BY MOUTH 3 TIMES A DAY WITH MEAL 05/19/21  Yes Masoud, Viann Shove, MD  Cholecalciferol (VITAMIN D3) 5000 units TABS Take 5,000 Units by mouth daily.    Yes [provider]  docusate sodium (COLACE) 100 MG capsule TAKE 1 CAPSULE BY MOUTH TWICE A DAY 10/06/21  Yes Masoud, Viann Shove, MD  donepezil (ARICEPT) 10 MG tablet TAKE 1 TABLET BY MOUTH EVERY DAY 04/22/21  Yes Masoud, Viann Shove, MD  gabapentin (NEURONTIN) 300 MG capsule TAKE 1 CAPSULE BY MOUTH EVERY DAY AT BEDTIME AS DIRECTED 11/05/21  Yes Masoud, Viann Shove, MD  hydrALAZINE (APRESOLINE) 50 MG tablet Take  1 tablet (50 mg total) by mouth 3 (three) times daily. Patient taking differently: Take 25 mg by mouth in the morning and at bedtime. 10/16/19  Yes Masoud, Viann Shove, MD  insulin aspart (NOVOLOG) 100 UNIT/ML FlexPen Inject 10 Units into the skin at bedtime.   Yes [provider]  insulin detemir (LEVEMIR) 100 UNIT/ML injection Inject 20 Units into the skin at bedtime.   Yes [provider]  levocetirizine (XYZAL)  5 MG tablet TAKE 1 TABLET BY MOUTH EVERY DAY IN THE EVENING 01/07/21  Yes Masoud, Viann Shove, MD  levothyroxine (SYNTHROID) 137 MCG tablet TAKE 1 TABLET BY MOUTH EVERY DAY 03/24/21  Yes Masoud, Viann Shove, MD  losartan (COZAAR) 25 MG tablet TAKE 1 TABLET BY MOUTH EVERY EVENING 04/22/21  Yes Masoud, Viann Shove, MD  metoprolol succinate (TOPROL-XL) 50 MG 24 hr tablet TAKE 1 TABLET BY MOUTH EVERY DAY 04/22/21  Yes Masoud, Viann Shove, MD  Multiple Vitamins-Minerals (MULTIVITAMIN WITH MINERALS) tablet Take 1 tablet by mouth daily.   Yes [provider]  pantoprazole (PROTONIX) 40 MG tablet TAKE 1 TABLET BY MOUTH EVERY DAY 07/09/21  Yes Masoud, Viann Shove, MD  sevelamer carbonate (RENVELA) 2.4 g PACK Take 2.4 g by mouth daily. 04/26/19  Yes [provider]  simvastatin (ZOCOR) 20 MG tablet TAKE 1 TABLET BY MOUTH EVERY DAY IN THE EVENING 03/03/21  Yes Masoud, Viann Shove, MD  sitaGLIPtin (JANUVIA) 50 MG tablet Take 50 mg by mouth every evening.    Yes [provider]  albuterol (PROVENTIL) (2.5 MG/3ML) 0.083% nebulizer solution Take 3 mLs (2.5 mg total) by nebulization every 6 (six) hours as needed for wheezing or shortness of breath. 10/28/20   [name], MD  BD PEN NEEDLE NANO 2ND GEN 32G X 4 MM MISC USE AS DIRECTED 02/24/21   [name], MD  Continuous Blood Gluc Sensor (FREESTYLE LIBRE 14 DAY SENSOR) MISC APPLY EVERY 14 (FOURTEEN) DAYS. 11/07/21   [name], MD     ALLERGIES:  No Known Allergies   SOCIAL HISTORY:  Social History   Socioeconomic History   Marital status: Married    Spouse name: Not on file   Number of children: Not on file   Years of education: 12   Highest education level: 12th grade  Occupational History   Occupation: Retired  Tobacco Use   Smoking status: Never   Smokeless tobacco: Current    Types: Snuff  Vaping Use   Vaping Use: Never used  Substance and Sexual Activity   Alcohol use: No   Drug use: No   Sexual activity: Not Currently  Other Topics Concern    Not on file  Social History Narrative   Not on file   Social Determinants of Health   Financial Resource Strain: Low Risk  (06/19/2021)   Overall Financial Resource Strain (CARDIA)    Difficulty of Paying Living Expenses: Not hard at all  Food Insecurity: No Food Insecurity (06/19/2021)   Hunger Vital Sign    Worried About Running Out of Food in the Last Year: Never true    [place] in the Last Year: Never true  Transportation Needs: No Transportation Needs (06/19/2021)   PRAPARE - Hydrologist (Medical): No    Lack of Transportation (Non-Medical): No  Physical Activity: Sufficiently Active (06/19/2021)   Exercise Vital Sign    Days of Exercise per Week: 7 days    Minutes of Exercise per Session: 30 min  Stress: No Stress Concern Present (06/19/2021)  [place]    Feeling of Stress : Not at all  Social Connections: Socially Integrated (06/19/2021)   Social Connection and Isolation Panel [NHANES]    Frequency of Communication with Friends and Family: More than three times a week    Frequency of Social Gatherings with Friends and Family: More than three times a week    Attends Religious Services: More than 4 times per year    Active Member of [employer] or Organizations: Yes    Attends Music therapist: More than 4 times per year    Marital Status: Married  Human resources officer Violence: Not At Risk (06/19/2021)   Humiliation, Afraid, Rape, and Kick questionnaire    Fear of Current or Ex-Partner: No    Emotionally Abused: No    Physically Abused: No    Sexually Abused: No     FAMILY HISTORY:  Family History  Problem Relation Age of Onset   Diabetes Mother    Hyperlipidemia Mother    Hypertension Father       REVIEW OF SYSTEMS:  Review of Systems  Unable to perform ROS: Mental acuity  Constitutional:  Positive for fever. Negative for chills.  All other systems reviewed  and are negative.   VITAL SIGNS:  Temp:  [97.8 F (36.6 C)-100.7 F (38.2 C)] 98.7 F (37.1 C) (08/30 0449) Pulse Rate:  [58-83] 63 (08/30 0449) Resp:  [15-20] 20 (08/30 0449) BP: (120-174)/(48-77) 167/69 (08/30 0449) SpO2:  [97 %-100 %] 100 % (08/30 0449)     Height: '5\' 4"'$  (162.6 cm) Weight: 70.8 kg BMI (Calculated): 26.78   INTAKE/OUTPUT:  08/29 0701 - 08/30 0700 In: 240 [P.O.:240] Out: -   PHYSICAL EXAM:  Physical Exam Vitals and nursing note reviewed. Exam conducted with a chaperone present.  Constitutional:      General: He is not in acute distress.    Appearance: Normal appearance. He is not ill-appearing.     Comments: Patient resting in bed; communicative, altered  Eyes:     General: No scleral icterus.    Conjunctiva/sclera: Conjunctivae normal.  Cardiovascular:     Rate and Rhythm: Normal rate and regular rhythm.  Pulmonary:     Effort: Pulmonary effort is normal. No respiratory distress.     Breath sounds: Normal breath sounds.  Abdominal:     General: Abdomen is flat.     Palpations: Abdomen is soft.     Tenderness: There is no abdominal tenderness. There is no guarding or rebound.     Comments: Abdomen is soft, he does not appear tender but is not overly reliable, non-distended, no rebound/guarding   Genitourinary:    Comments: Deferred Musculoskeletal:     Right lower leg: No edema.     Left lower leg: No edema.  Skin:    General: Skin is warm and dry.     Coloration: Skin is not pale.     Findings: No erythema.  Neurological:     Mental Status: He is alert.     Comments: He is in NAD; communicative, appears confused/altered  Psychiatric:     Comments: Unable to reliably assess       Labs:     Latest Ref Rng & Units 01/20/2022    4:38 AM 01/19/2022    6:40 AM 01/18/2022   11:51 AM  CBC  WBC 4.0 - 10.5 K/uL 17.7  22.8  34.6   Hemoglobin 13.0 - 17.0 g/dL 11.0  10.7  12.1   Hematocrit 39.0 - 52.0 % 33.7  33.9  38.0   Platelets 150 - 400 K/uL  218  211  240       Latest Ref Rng & Units 01/20/2022    4:38 AM 01/19/2022    6:40 AM 01/18/2022   11:51 AM  CMP  Glucose 70 - 99 mg/dL 104  214  233   BUN 8 - 23 mg/dL 30  46  71   Creatinine 0.61 - 1.24 mg/dL 5.85  9.09  13.80   Sodium 135 - 145 mmol/L 133  134  128   Potassium 3.5 - 5.1 mmol/L 3.4  4.3  6.7   Chloride 98 - 111 mmol/L 95  93  94   CO2 22 - 32 mmol/L '26  27  20   '$ Calcium 8.9 - 10.3 mg/dL 8.6  8.8  9.8   Total Protein 6.5 - 8.1 g/dL 7.1   8.2   Total Bilirubin 0.3 - 1.2 mg/dL 0.6   1.5   Alkaline Phos 38 - 126 U/L 92   106   AST 15 - 41 U/L 30   11   ALT 0 - 44 U/L 21   13      Imaging studies:   CT Chest/Abdomen/Pelvis (01/20/2022) personally reviewed which shows thickened and irregular gallbladder, surrounding inflammation, appearance is consistent with cholecystitis, and radiologist report reviewed below: IMPRESSION: Chest Impression:   1. No evidence of pulmonary infection.   Abdomen / Pelvis Impression:   1. Gallbladder wall thickening with pericholecystic fluid and irregular gallbladder wall enhancement. Findings are concerning for ACUTE CHOLECYSTITIS. No radiodense gallstones are noted (potential acalculous cholecystitis versus non radiodense gallstones). 2. Bilateral renal cortical thinning.  Benign bilateral renal cysts. 3. Bladder wall thickening.   Assessment/Plan: (ICD-10's: K81.0) 76 y.o. male with fever and leukocytosis, initially unknown source, but found to have inflammation and irregularity of gallbladder on CT yesterday, complicated by deconditioning, ESRD on HD, AMS   - Given comorbid conditions, AMS, deconditioning, he is a sub-optimal surgical candidate. As such, case was discussed with interventional radiology who are in agreement to place cholecystostomy tube   - NPO for IR procedure - IVF support - Continue IV Abx - Monitor abdominal examination - Monitor leukocytosis   - DVT prophylaxis; hold for IR procedure   - Further  management per primary service; we will follow   All of the above findings and recommendations were discussed with the medical team. No family at bedside.   Thank you for the opportunity to participate in this patient's care.   -- Edison Simon, PA-C [facility] 01/21/2022, 7:11 AM M-F: 7am - 4pm

## 2022-01-21 NOTE — Progress Notes (Addendum)
[facility]  ROUNDING NOTE   Subjective:   Patient seen resting quietly in bed, no family at bedside Patient currently n.p.o. awaiting surgical evaluation Denies abdominal pain Denies nausea and vomiting Patient seen later in morning with wife at bedside IR planning to place percutaneous drain.   Objective:  Vital signs in last 24 hours:  Temp:  [97.7 F (36.5 C)-98.8 F (37.1 C)] 98.8 F (37.1 C) (08/30 1209) Pulse Rate:  [58-63] 62 (08/30 1350) Resp:  [14-21] 16 (08/30 1402) BP: (84-174)/(36-74) 105/62 (08/30 1350) SpO2:  [96 %-100 %] 99 % (08/30 1350)  Weight change:  Filed Weights   01/18/22 1135 01/19/22 1400  Weight: 65.8 kg 70.8 kg    Intake/Output: I/O last 3 completed shifts: In: 588.3 [P.O.:240; IV Piggyback:348.3] Out: -    Intake/Output this shift:  No intake/output data recorded.  Physical Exam: General: NAD  Head: Normocephalic, atraumatic. Moist oral mucosal membranes  Eyes: Anicteric  Lungs:  Clear to auscultation, normal effort, room air  Heart: Regular rate and rhythm  Abdomen:  Soft, nontender  Extremities: No peripheral edema.  Neurologic: Nonfocal, moving all four extremities  Skin: No lesions  Access: Right chest PermCath    Basic Metabolic Panel: Recent Labs  Lab 01/18/22 1151 01/19/22 0640 01/20/22 0438 01/21/22 0458  NA 128* 134* 133* 125*  K 6.7* 4.3 3.4* 4.3  CL 94* 93* 95* 89*  CO2 20* '27 26 23  '$ GLUCOSE 233* 214* 104* 107*  BUN 71* 46* 30* 44*  CREATININE 13.80* 9.09* 5.85* 7.39*  CALCIUM 9.8 8.8* 8.6* 8.5*  PHOS  --  4.3 1.9* 2.3*     Liver Function Tests: Recent Labs  Lab 01/18/22 1151 01/19/22 0640 01/20/22 0438 01/21/22 0458  AST 11*  --  30  --   ALT 13  --  21  --   ALKPHOS 106  --  92  --   BILITOT 1.5*  --  0.6  --   PROT 8.2*  --  7.1  --   ALBUMIN 3.5 3.1* 2.8*  3.0* 2.5*    No results for input(s): "LIPASE", "AMYLASE" in the last 168 hours. No results for input(s): "AMMONIA" in  the last 168 hours.  CBC: Recent Labs  Lab 01/18/22 1151 01/19/22 0640 01/20/22 0438  WBC 34.6* 22.8* 17.7*  NEUTROABS 24.9*  --   --   HGB 12.1* 10.7* 11.0*  HCT 38.0* 33.9* 33.7*  MCV 86.8 86.0 86.0  PLT 240 211 218     Cardiac Enzymes: No results for input(s): "CKTOTAL", "CKMB", "CKMBINDEX", "TROPONINI" in the last 168 hours.  BNP: Invalid input(s): "POCBNP"  CBG: Recent Labs  Lab 01/20/22 1632 01/20/22 2254 01/21/22 0829 01/21/22 0943 01/21/22 1251  GLUCAP 128* 190* 54* 172* 26     Microbiology: Results for orders placed or performed during the hospital encounter of 01/18/22  Blood Culture (routine x 2)     Status: None (Preliminary result)   Collection Time: 01/18/22 11:51 AM   Specimen: BLOOD  Result Value Ref Range Status   Specimen Description BLOOD LEFT AC  Final   Special Requests   Final    BOTTLES DRAWN AEROBIC AND ANAEROBIC Blood Culture results may not be optimal due to an excessive volume of blood received in culture bottles   Culture   Final    NO GROWTH 3 DAYS Performed at [facility], [address], [place], [place] [address]    Report Status PENDING  Incomplete  Resp Panel  by RT-PCR (Flu A&B, Covid) Anterior Nasal Swab     Status: None   Collection Time: 01/18/22 11:51 AM   Specimen: Anterior Nasal Swab  Result Value Ref Range Status   SARS Coronavirus 2 by RT PCR NEGATIVE NEGATIVE Final    Comment: (NOTE) SARS-CoV-2 target nucleic acids are NOT DETECTED.  The SARS-CoV-2 RNA is generally detectable in upper respiratory specimens during the acute phase of infection. The lowest concentration of SARS-CoV-2 viral copies this assay can detect is 138 copies/mL. A negative result does not preclude SARS-Cov-2 infection and should not be used as the sole basis for treatment or other patient management decisions. A negative result may occur with  improper specimen collection/handling, submission of specimen other than nasopharyngeal  swab, presence of viral mutation(s) within the areas targeted by this assay, and inadequate number of viral copies(<138 copies/mL). A negative result must be combined with clinical observations, patient history, and epidemiological information. The expected result is Negative.  Fact Sheet for Patients:  EntrepreneurPulse.com.au  Fact Sheet for Healthcare Providers:  IncredibleEmployment.be  This test is no t yet approved or cleared by the [place] FDA and  has been authorized for detection and/or diagnosis of SARS-CoV-2 by FDA under an Emergency Use Authorization (EUA). This EUA will remain  in effect (meaning this test can be used) for the duration of the COVID-19 declaration under Section 564(b)(1) of the Act, 21 U.S.C.section 360bbb-3(b)(1), unless the authorization is terminated  or revoked sooner.       Influenza A by PCR NEGATIVE NEGATIVE Final   Influenza B by PCR NEGATIVE NEGATIVE Final    Comment: (NOTE) The Xpert Xpress SARS-CoV-2/FLU/RSV plus assay is intended as an aid in the diagnosis of influenza from Nasopharyngeal swab specimens and should not be used as a sole basis for treatment. Nasal washings and aspirates are unacceptable for Xpert Xpress SARS-CoV-2/FLU/RSV testing.  Fact Sheet for Patients: EntrepreneurPulse.com.au  Fact Sheet for Healthcare Providers: IncredibleEmployment.be  This test is not yet approved or cleared by the [place] FDA and has been authorized for detection and/or diagnosis of SARS-CoV-2 by FDA under an Emergency Use Authorization (EUA). This EUA will remain in effect (meaning this test can be used) for the duration of the COVID-19 declaration under Section 564(b)(1) of the Act, 21 U.S.C. section 360bbb-3(b)(1), unless the authorization is terminated or revoked.  Performed at [facility], [address] [place], [place] [address]   Blood  Culture (routine x 2)     Status: None (Preliminary result)   Collection Time: 01/18/22  2:30 PM   Specimen: BLOOD  Result Value Ref Range Status   Specimen Description BLOOD RIGHT ARM  Final   Special Requests   Final    BOTTLES DRAWN AEROBIC AND ANAEROBIC Blood Culture adequate volume   Culture  Setup Time PENDING  Incomplete   Culture   Final    NO GROWTH 3 DAYS Performed at [facility], [address], [place], [place] [address]    Report Status PENDING  Incomplete    Coagulation Studies: No results for input(s): "LABPROT", "INR" in the last 72 hours.   Urinalysis: No results for input(s): "COLORURINE", "LABSPEC", "PHURINE", "GLUCOSEU", "HGBUR", "BILIRUBINUR", "KETONESUR", "PROTEINUR", "UROBILINOGEN", "NITRITE", "LEUKOCYTESUR" in the last 72 hours.  Invalid input(s): "APPERANCEUR"    Imaging: CT CHEST ABDOMEN PELVIS W CONTRAST  Result Date: 01/20/2022 CLINICAL DATA:  Sepsis, fever of undetermined source. EXAM: CT CHEST, ABDOMEN, AND PELVIS WITH CONTRAST TECHNIQUE: Multidetector CT imaging of the chest,  abdomen and pelvis was performed following the standard protocol during bolus administration of intravenous contrast. RADIATION DOSE REDUCTION: This exam was performed according to the departmental dose-optimization program which includes automated exposure control, adjustment of the mA and/or kV according to patient size and/or use of iterative reconstruction technique. CONTRAST:  157m OMNIPAQUE IOHEXOL 300 MG/ML  SOLN COMPARISON:  None Available. FINDINGS: CT CHEST FINDINGS Cardiovascular: No significant vascular findings. Normal heart size. No pericardial effusion. Large-bore venous catheter in the RIGHT atrium Mediastinum/Nodes: No axillary or supraclavicular adenopathy. No mediastinal or hilar adenopathy. No pericardial fluid. Esophagus normal. Lungs/Pleura: No suspicious pulmonary nodules. Normal pleural. Airways normal. No pneumonia. Musculoskeletal: No aggressive  osseous lesion. CT ABDOMEN AND PELVIS FINDINGS Hepatobiliary: The gallbladder wall is thickened and hazy. Small amount of pericholecystic fluid. There is irregular gallbladder wall enhancement. There is haziness within the peritoneal fat adjacent the gallbladder fundus. The gallbladder is mildly distended at 4.6 cm. No radiodense gallstones are evident. Common bile duct is normal caliber. No intrahepatic duct dilatation. Pancreas: Pancreas is normal. No ductal dilatation. No pancreatic inflammation. Spleen: Normal spleen Adrenals/urinary tract: Adrenal glands normal. Bilateral renal cortical thinning. Benign-appearing cysts in the kidneys. Ureters bladder normal. The bladder is thick-walled but predominantly collapsed. Stomach/Bowel: Stomach, small bowel, appendix, and cecum are normal. The colon and rectosigmoid colon are normal. Vascular/Lymphatic: Abdominal aorta is normal caliber with atherosclerotic calcification. There is no retroperitoneal or periportal lymphadenopathy. No pelvic lymphadenopathy. Reproductive: Unremarkable Other: No free fluid. Musculoskeletal: No aggressive osseous lesion. IMPRESSION: Chest Impression: 1. No evidence of pulmonary infection. Abdomen / Pelvis Impression: 1. Gallbladder wall thickening with pericholecystic fluid and irregular gallbladder wall enhancement. Findings are concerning for ACUTE CHOLECYSTITIS. No radiodense gallstones are noted (potential acalculous cholecystitis versus non radiodense gallstones). 2. Bilateral renal cortical thinning.  Benign bilateral renal cysts. 3. Bladder wall thickening. These results will be called to the ordering clinician or representative by the Radiologist Assistant, and communication documented in the PACS or [employer] Electronically Signed   By: SSuzy BouchardM.D.   On: 01/20/2022 21:06     Medications:    anticoagulant sodium citrate     ceFEPime (MAXIPIME) IV Stopped (01/21/22 07939    aspirin EC  81 mg Oral Daily    Chlorhexidine Gluconate Cloth  6 each Topical Q0600   donepezil  10 mg Oral Daily   fentaNYL       gabapentin  300 mg Oral QHS   hydrALAZINE  25 mg Oral BID   insulin aspart  0-9 Units Subcutaneous TID WC   levothyroxine  137 mcg Oral Q0600   loratadine  10 mg Oral QPM   losartan  25 mg Oral QPM   metoprolol succinate  50 mg Oral Daily   pantoprazole  40 mg Oral Daily   sevelamer carbonate  2.4 g Oral Q breakfast   simvastatin  20 mg Oral q1800   sodium chloride flush  3 mL Intravenous Q12H   acetaminophen **OR** acetaminophen, albuterol, alteplase, alum & mag hydroxide-simeth, anticoagulant sodium citrate, chlorproMAZINE, fentaNYL, heparin, heparin, lidocaine (PF), lidocaine-prilocaine, pentafluoroprop-tetrafluoroeth  Assessment/ Plan:  Mr. MBoomer Windersis a 76y.o.  male with a past medical history of hypertension, end-stage renal disease, on hemodialysis on Monday Wednesday Friday schedule, diabetes mellitus, CVA who was brought to the ER with chief complaint of weakness.  [facility]/MWF/right chest PermCath  End-stage renal disease on hemodialysis.    We will provide dialysis later today after procedure.  2. Anemia of chronic kidney  disease  Lab Results  Component Value Date   HGB 11.0 (L) 01/20/2022    Hemoglobin remains within acceptable target.  3. Secondary Hyperparathyroidism: with outpatient labs: PTH 422, phosphorus 6.4, calcium 9.1 on 12/29/2021.   Lab Results  Component Value Date   CALCIUM 8.5 (L) 01/21/2022   CAION 0.83 (LL) 12/10/2020   PHOS 2.3 (L) 01/21/2022    Calcium stable.  We will continue to hold calcium acetate due to decreased phosphorus.  4.  Hypertension with chronic kidney disease.  Home regimen includes hydralazine and losartan.  Currently receiving these medications along with metoprolol.  Blood pressure 134/63.  5. Diabetes mellitus type II with chronic kidney disease insulin dependent. Home regimen includes NovoLog and Levemir. Most  recent hemoglobin A1c is 8.1 on 01/18/2022.  Currently prescribed Januvia as well.   Glucose remains elevated at times.  6.  Acute cholecystitis seen on CT.  Surgery consulted.  IR will place percutaneous drain and when stable with medications, will consider surgery.   LOS: 3   8/30/20232:08 PM

## 2022-01-22 DIAGNOSIS — K810 Acute cholecystitis: Secondary | ICD-10-CM | POA: Diagnosis not present

## 2022-01-22 DIAGNOSIS — A419 Sepsis, unspecified organism: Secondary | ICD-10-CM | POA: Diagnosis not present

## 2022-01-22 LAB — GLUCOSE, CAPILLARY
Glucose-Capillary: 177 mg/dL — ABNORMAL HIGH (ref 70–99)
Glucose-Capillary: 265 mg/dL — ABNORMAL HIGH (ref 70–99)
Glucose-Capillary: 248 mg/dL — ABNORMAL HIGH (ref 70–99)
Glucose-Capillary: 301 mg/dL — ABNORMAL HIGH (ref 70–99)

## 2022-01-22 LAB — CBC
HCT: 33.1 % — ABNORMAL LOW (ref 39.0–52.0)
Hemoglobin: 10.6 g/dL — ABNORMAL LOW (ref 13.0–17.0)
MCH: 27.6 pg (ref 26.0–34.0)
MCHC: 32 g/dL (ref 30.0–36.0)
MCV: 86.2 fL (ref 80.0–100.0)
Platelets: 272 10*3/uL (ref 150–400)
RBC: 3.84 MIL/uL — ABNORMAL LOW (ref 4.22–5.81)
RDW: 17.1 % — ABNORMAL HIGH (ref 11.5–15.5)
WBC: 13.2 10*3/uL — ABNORMAL HIGH (ref 4.0–10.5)
nRBC: 0 % (ref 0.0–0.2)

## 2022-01-22 LAB — BASIC METABOLIC PANEL
Anion gap: 13 (ref 5–15)
BUN: 23 mg/dL (ref 8–23)
CO2: 26 mmol/L (ref 22–32)
Calcium: 8.1 mg/dL — ABNORMAL LOW (ref 8.9–10.3)
Chloride: 90 mmol/L — ABNORMAL LOW (ref 98–111)
Creatinine, Ser: 5.05 mg/dL — ABNORMAL HIGH (ref 0.61–1.24)
GFR, Estimated: 11 mL/min — ABNORMAL LOW (ref >60)
Glucose, Bld: 229 mg/dL — ABNORMAL HIGH (ref 70–99)
Potassium: 4 mmol/L (ref 3.5–5.1)
Sodium: 129 mmol/L — ABNORMAL LOW (ref 135–145)

## 2022-01-22 LAB — MAGNESIUM: Magnesium: 1.8 mg/dL (ref 1.7–2.4)

## 2022-01-22 MED ORDER — NEPRO/CARBSTEADY PO LIQD
237.0000 mL | Freq: Two times a day (BID) | ORAL | Status: DC
  Administered 2022-01-22 – 2022-01-23 (×2): 237 mL via ORAL

## 2022-01-22 MED ORDER — SODIUM CHLORIDE 0.9 % IV SOLN
12.5000 mg | Freq: Four times a day (QID) | INTRAVENOUS | Status: DC | PRN
  Administered 2022-01-23 – 2022-01-29 (×9): 12.5 mg via INTRAVENOUS
  Filled 2022-01-22 (×14): qty 0.5

## 2022-01-22 MED ORDER — INSULIN DETEMIR 100 UNIT/ML ~~LOC~~ SOLN
8.0000 [IU] | Freq: Every day | SUBCUTANEOUS | Status: DC
  Administered 2022-01-22: 8 [IU] via SUBCUTANEOUS
  Filled 2022-01-22: qty 0.08

## 2022-01-22 MED ORDER — SODIUM CHLORIDE 0.9 % IV SOLN
25.0000 mg | Freq: Once | INTRAVENOUS | Status: AC
  Administered 2022-01-22: 25 mg via INTRAVENOUS
  Filled 2022-01-22: qty 1

## 2022-01-22 MED ORDER — ENSURE ENLIVE PO LIQD: 237.0000 mL | Freq: Two times a day (BID) | ORAL | Status: DC

## 2022-01-22 MED ORDER — SODIUM CHLORIDE 0.9% FLUSH
5.0000 mL | Freq: Every day | INTRAVENOUS | 0 refills | Status: DC
Start: 2022-01-22 — End: 2022-03-26

## 2022-01-22 NOTE — Progress Notes (Signed)
[facility]  ROUNDING NOTE   Subjective:   Patient seen sitting up in bed, resting Alert and oriented Complains of abdominal pain, 6/10 Appetite remains poor  Objective:  Vital signs in last 24 hours:  Temp:  [97.6 F (36.4 C)-98.9 F (37.2 C)] 98.9 F (37.2 C) (08/31 0745) Pulse Rate:  [51-77] 77 (08/31 0745) Resp:  [11-21] 18 (08/31 0745) BP: (84-185)/(36-97) 164/86 (08/31 0745) SpO2:  [96 %-100 %] 100 % (08/31 0745) Weight:  [69.5 kg] 69.5 kg (08/30 1817)  Weight change:  Filed Weights   01/18/22 1135 01/19/22 1400 01/21/22 1817  Weight: 65.8 kg 70.8 kg 69.5 kg    Intake/Output: I/O last 3 completed shifts: In: 200 [IV Piggyback:200] Out: 1575 [Drains:75; Other:1500]   Intake/Output this shift:  No intake/output data recorded.  Physical Exam: General: NAD  Head: Normocephalic, atraumatic. Moist oral mucosal membranes  Eyes: Anicteric  Lungs:  Clear to auscultation, normal effort, room air  Heart: Regular rate and rhythm  Abdomen:  Soft, nontender  Extremities: No peripheral edema.  Neurologic: Nonfocal, moving all four extremities  Skin: No lesions  Access: Right chest PermCath    Basic Metabolic Panel: Recent Labs  Lab 01/18/22 1151 01/19/22 0640 01/20/22 0438 01/21/22 0458 01/22/22 0427  NA 128* 134* 133* 125* 129*  K 6.7* 4.3 3.4* 4.3 4.0  CL 94* 93* 95* 89* 90*  CO2 20* '27 26 23 26  '$ GLUCOSE 233* 214* 104* 107* 229*  BUN 71* 46* 30* 44* 23  CREATININE 13.80* 9.09* 5.85* 7.39* 5.05*  CALCIUM 9.8 8.8* 8.6* 8.5* 8.1*  MG  --   --   --   --  1.8  PHOS  --  4.3 1.9* 2.3*  --      Liver Function Tests: Recent Labs  Lab 01/18/22 1151 01/19/22 0640 01/20/22 0438 01/21/22 0458  AST 11*  --  30  --   ALT 13  --  21  --   ALKPHOS 106  --  92  --   BILITOT 1.5*  --  0.6  --   PROT 8.2*  --  7.1  --   ALBUMIN 3.5 3.1* 2.8*  3.0* 2.5*    No results for input(s): "LIPASE", "AMYLASE" in the last 168 hours. No results for  input(s): "AMMONIA" in the last 168 hours.  CBC: Recent Labs  Lab 01/18/22 1151 01/19/22 0640 01/20/22 0438 01/22/22 0427  WBC 34.6* 22.8* 17.7* 13.2*  NEUTROABS 24.9*  --   --   --   HGB 12.1* 10.7* 11.0* 10.6*  HCT 38.0* 33.9* 33.7* 33.1*  MCV 86.8 86.0 86.0 86.2  PLT 240 211 218 272     Cardiac Enzymes: No results for input(s): "CKTOTAL", "CKMB", "CKMBINDEX", "TROPONINI" in the last 168 hours.  BNP: Invalid input(s): "POCBNP"  CBG: Recent Labs  Lab 01/21/22 0943 01/21/22 1251 01/21/22 1840 01/21/22 1942 01/22/22 0744  GLUCAP 172* 89 77 121* 177*     Microbiology: Results for orders placed or performed during the hospital encounter of 01/18/22  Blood Culture (routine x 2)     Status: None (Preliminary result)   Collection Time: 01/18/22 11:51 AM   Specimen: BLOOD  Result Value Ref Range Status   Specimen Description BLOOD LEFT AC  Final   Special Requests   Final    BOTTLES DRAWN AEROBIC AND ANAEROBIC Blood Culture results may not be optimal due to an excessive volume of blood received in culture bottles   Culture   Final  NO GROWTH 4 DAYS Performed at [facility], [address] [place], [place] [address]    Report Status PENDING  Incomplete  Resp Panel by RT-PCR (Flu A&B, Covid) Anterior Nasal Swab     Status: None   Collection Time: 01/18/22 11:51 AM   Specimen: Anterior Nasal Swab  Result Value Ref Range Status   SARS Coronavirus 2 by RT PCR NEGATIVE NEGATIVE Final    Comment: (NOTE) SARS-CoV-2 target nucleic acids are NOT DETECTED.  The SARS-CoV-2 RNA is generally detectable in upper respiratory specimens during the acute phase of infection. The lowest concentration of SARS-CoV-2 viral copies this assay can detect is 138 copies/mL. A negative result does not preclude SARS-Cov-2 infection and should not be used as the sole basis for treatment or other patient management decisions. A negative result may occur with  improper specimen  collection/handling, submission of specimen other than nasopharyngeal swab, presence of viral mutation(s) within the areas targeted by this assay, and inadequate number of viral copies(<138 copies/mL). A negative result must be combined with clinical observations, patient history, and epidemiological information. The expected result is Negative.  Fact Sheet for Patients:  EntrepreneurPulse.com.au  Fact Sheet for Healthcare Providers:  IncredibleEmployment.be  This test is no t yet approved or cleared by the [place] FDA and  has been authorized for detection and/or diagnosis of SARS-CoV-2 by FDA under an Emergency Use Authorization (EUA). This EUA will remain  in effect (meaning this test can be used) for the duration of the COVID-19 declaration under Section 564(b)(1) of the Act, 21 U.S.C.section 360bbb-3(b)(1), unless the authorization is terminated  or revoked sooner.       Influenza A by PCR NEGATIVE NEGATIVE Final   Influenza B by PCR NEGATIVE NEGATIVE Final    Comment: (NOTE) The Xpert Xpress SARS-CoV-2/FLU/RSV plus assay is intended as an aid in the diagnosis of influenza from Nasopharyngeal swab specimens and should not be used as a sole basis for treatment. Nasal washings and aspirates are unacceptable for Xpert Xpress SARS-CoV-2/FLU/RSV testing.  Fact Sheet for Patients: EntrepreneurPulse.com.au  Fact Sheet for Healthcare Providers: IncredibleEmployment.be  This test is not yet approved or cleared by the [place] FDA and has been authorized for detection and/or diagnosis of SARS-CoV-2 by FDA under an Emergency Use Authorization (EUA). This EUA will remain in effect (meaning this test can be used) for the duration of the COVID-19 declaration under Section 564(b)(1) of the Act, 21 U.S.C. section 360bbb-3(b)(1), unless the authorization is terminated or revoked.  Performed at [facility], [address] [place], [place] [address]   Blood Culture (routine x 2)     Status: None (Preliminary result)   Collection Time: 01/18/22  2:30 PM   Specimen: BLOOD  Result Value Ref Range Status   Specimen Description BLOOD RIGHT ARM  Final   Special Requests   Final    BOTTLES DRAWN AEROBIC AND ANAEROBIC Blood Culture adequate volume   Culture  Setup Time PENDING  Incomplete   Culture   Final    NO GROWTH 4 DAYS Performed at [facility], [address] [place], [place] [address]    Report Status PENDING  Incomplete  Aerobic/Anaerobic Culture w Gram Stain (surgical/deep wound)     Status: None (Preliminary result)   Collection Time: 01/21/22  3:30 PM   Specimen: Abscess  Result Value Ref Range Status   Specimen Description   Final    ABSCESS GALL BLADDER Performed at [facility], [address] [place], [place] [address]    Special Requests   Final    NONE Performed at [facility], [place], [place] [address]    Gram Stain   Final    FEW WBC PRESENT,BOTH PMN AND MONONUCLEAR RARE GRAM POSITIVE COCCI IN PAIRS    Culture   Final    CULTURE REINCUBATED FOR BETTER GROWTH Performed at [facility], [place] [address], [place], [place] [address]    Report Status PENDING  Incomplete    Coagulation Studies: No results for input(s): "LABPROT", "INR" in the last 72 hours.   Urinalysis: No results for input(s): "COLORURINE", "LABSPEC", "PHURINE", "GLUCOSEU", "HGBUR", "BILIRUBINUR", "KETONESUR", "PROTEINUR", "UROBILINOGEN", "NITRITE", "LEUKOCYTESUR" in the last 72 hours.  Invalid input(s): "APPERANCEUR"    Imaging: IR Perc Cholecystostomy  Result Date: 01/21/2022 INDICATION: 76 year old male with acute acalculous cholecystitis, poor surgical candidate. EXAM: Ultrasound and fluoroscopic guided percutaneous cholecystostomy tube placement MEDICATIONS: The patient was receiving intravenous antibiotics as an  inpatient. ANESTHESIA/SEDATION: Moderate (conscious) sedation was employed during this procedure. A total of Versed 0 mg and Fentanyl 25 mcg was administered intravenously. Moderate Sedation Time: 0 minutes. The patient's level of consciousness and vital signs were monitored continuously by radiology nursing throughout the procedure under my direct supervision. FLUOROSCOPY TIME:  41.2 mGy COMPLICATIONS: None immediate. PROCEDURE: Informed written consent was obtained from the patient after a thorough discussion of the procedural risks, benefits and alternatives. All questions were addressed. Maximal Sterile Barrier Technique was utilized including caps, mask, sterile gowns, sterile gloves, sterile drape, hand hygiene and skin antiseptic. A timeout was performed prior to the initiation of the procedure. The patient was placed supine on the angiographic table. The patient's right upper quadrant was then prepped and draped in normal sterile fashion with maximum sterile barrier. Ultrasound demonstrates a distended gallbladder. Subdermal Local anesthesia was provided at the planned skin entry site. Under ultrasound guidance, deeper local anesthetic was provided through intercostal muscles and along the liver capsule. Ultrasound was used to puncture the gallbladder using a 21 gauge Chiba needle via a transhepatic approach with visualization of the lung treated to the gallbladder. A 0.018 inch wire was advanced into the lumen and a transition dilator placed. A gentle hand injection of contrast was performed. Cholecystogram demonstrates the cystic duct is obstructed. A 0.035 inch exchange wire was placed in the tract was dilated. A 10.2 French multipurpose drainage catheter was advanced into the gallbladder lumen. The drain was then secured in place using a 0-silk suture and a Stayfix device. A sterile dressing was applied. The tube was placed to bag drainage. A culture was sent to the lab for analysis. The patient  tolerated procedure well without evidence of immediate complication was transferred back to the floor in stable condition. IMPRESSION: Successful placement of percutaneous, transhepatic cholecystostomy tube. Ruthann Cancer, MD [facility] Radiology Electronically Signed   By: Ruthann Cancer M.D.   On: 01/21/2022 16:08   CT CHEST ABDOMEN PELVIS W CONTRAST  Result Date: 01/20/2022 CLINICAL DATA:  Sepsis, fever of undetermined source. EXAM: CT CHEST, ABDOMEN, AND PELVIS WITH CONTRAST TECHNIQUE: Multidetector CT imaging of the chest, abdomen and pelvis was performed following the standard protocol during bolus administration of intravenous contrast. RADIATION DOSE REDUCTION: This exam was performed according to the departmental dose-optimization program which includes automated exposure control, adjustment of the mA and/or kV according to patient size and/or use of iterative reconstruction technique. CONTRAST:  115m OMNIPAQUE IOHEXOL 300  MG/ML  SOLN COMPARISON:  None Available. FINDINGS: CT CHEST FINDINGS Cardiovascular: No significant vascular findings. Normal heart size. No pericardial effusion. Large-bore venous catheter in the RIGHT atrium Mediastinum/Nodes: No axillary or supraclavicular adenopathy. No mediastinal or hilar adenopathy. No pericardial fluid. Esophagus normal. Lungs/Pleura: No suspicious pulmonary nodules. Normal pleural. Airways normal. No pneumonia. Musculoskeletal: No aggressive osseous lesion. CT ABDOMEN AND PELVIS FINDINGS Hepatobiliary: The gallbladder wall is thickened and hazy. Small amount of pericholecystic fluid. There is irregular gallbladder wall enhancement. There is haziness within the peritoneal fat adjacent the gallbladder fundus. The gallbladder is mildly distended at 4.6 cm. No radiodense gallstones are evident. Common bile duct is normal caliber. No intrahepatic duct dilatation. Pancreas: Pancreas is normal. No ductal dilatation.  No pancreatic inflammation. Spleen: Normal spleen Adrenals/urinary tract: Adrenal glands normal. Bilateral renal cortical thinning. Benign-appearing cysts in the kidneys. Ureters bladder normal. The bladder is thick-walled but predominantly collapsed. Stomach/Bowel: Stomach, small bowel, appendix, and cecum are normal. The colon and rectosigmoid colon are normal. Vascular/Lymphatic: Abdominal aorta is normal caliber with atherosclerotic calcification. There is no retroperitoneal or periportal lymphadenopathy. No pelvic lymphadenopathy. Reproductive: Unremarkable Other: No free fluid. Musculoskeletal: No aggressive osseous lesion. IMPRESSION: Chest Impression: 1. No evidence of pulmonary infection. Abdomen / Pelvis Impression: 1. Gallbladder wall thickening with pericholecystic fluid and irregular gallbladder wall enhancement. Findings are concerning for ACUTE CHOLECYSTITIS. No radiodense gallstones are noted (potential acalculous cholecystitis versus non radiodense gallstones). 2. Bilateral renal cortical thinning.  Benign bilateral renal cysts. 3. Bladder wall thickening. These results will be called to the ordering clinician or representative by the Radiologist Assistant, and communication documented in the PACS or [employer]. Electronically Signed   By: Suzy Bouchard M.D.   On: 01/20/2022 21:06     Medications:    cefTRIAXone (ROCEPHIN)  IV Stopped (01/21/22 2213)   chlorproMAZINE (THORAZINE) 25 mg in sodium chloride 0.9 % 25 mL IVPB     metronidazole 500 mg (01/22/22 0843)    aspirin EC  81 mg Oral Daily   Chlorhexidine Gluconate Cloth  6 each Topical Q0600   donepezil  10 mg Oral Daily   gabapentin  300 mg Oral QHS   heparin  5,000 Units Subcutaneous Q8H   hydrALAZINE  25 mg Oral BID   insulin aspart  0-9 Units Subcutaneous TID WC   levothyroxine  137 mcg Oral Q0600   loratadine  10 mg Oral QPM   losartan  25 mg Oral QPM   metoprolol succinate  50 mg Oral Daily   pantoprazole  40 mg  Oral Daily   sevelamer carbonate  2.4 g Oral Q breakfast   simvastatin  20 mg Oral q1800   sodium chloride flush  3 mL Intravenous Q12H   acetaminophen **OR** acetaminophen, albuterol, alum & mag hydroxide-simeth  Assessment/ Plan:  [name] is a 76 y.o.  male with a past medical history of hypertension, end-stage renal disease, on hemodialysis on Monday Wednesday Friday schedule, diabetes mellitus, CVA who was brought to the ER with chief complaint of weakness.  [facility]/MWF/right chest PermCath  End-stage renal disease on hemodialysis.    Received dialysis after dialysis yesterday, UF 1.5L achieved. Next treatment scheduled for Friday.    2. Anemia of chronic kidney disease  Lab Results  Component Value Date   HGB 10.6 (L) 01/22/2022    Hemoglobin at goal.  3. Secondary Hyperparathyroidism: with outpatient labs: PTH 422, phosphorus 6.4, calcium 9.1 on 12/29/2021.   Lab Results  Component Value  Date   CALCIUM 8.1 (L) 01/22/2022   CAION 0.83 (LL) 12/10/2020   PHOS 2.3 (L) 01/21/2022    Calcium decreasing daily, will consider oral calcium supplementation.   4.  Hypertension with chronic kidney disease.  Home regimen includes hydralazine and losartan.  Currently receiving these medications along with metoprolol.  Blood pressure 164/86 this morning  5. Diabetes mellitus type II with chronic kidney disease insulin dependent. Home regimen includes NovoLog and Levemir. Most recent hemoglobin A1c is 8.1 on 01/18/2022.  Currently prescribed Januvia as well.   Glucose remains elevated at times.  6.  Acute cholecystitis seen on CT.  Surgery consulted.  IR placed percutaneous drain on 01/21/22 and stabilizing with medications. Will plan for surgery at later date.   LOS: 4   8/31/202312:16 PM

## 2022-01-22 NOTE — Progress Notes (Signed)
[facility] SURGICAL PROGRESS NOTE (cpt [phone])  Hospital Day(s): 4  Interval History: Patient seen and examined, no acute events or new complaints overnight. Patient still confused. Having hiccups; getting thorazine. Leukocytosis continues to make improvements; now down to 13.2K (from 17.7K yesterday). BMP remains consistent with ESRD. Underwent percutaneous cholecystostomy tube placement with IR yesterday (08/30); output 75 ccs; seropurulent. Cx are growing GPC in pairs. He continues on rocephin and Flagyl. On renal diet.   Review of Systems:  Unable to reliably preform secondary to mental acuity  Vital signs in last 24 hours: [min-max] current  Temp:  [97.6 F (36.4 C)-98.8 F (37.1 C)] 98 F (36.7 C) (08/31 0411) Pulse Rate:  [51-67] 67 (08/31 0411) Resp:  [11-21] 17 (08/31 0411) BP: (84-185)/(36-97) 158/61 (08/31 0411) SpO2:  [96 %-100 %] 100 % (08/31 0411) Weight:  [69.5 kg] 69.5 kg (08/30 1817)     Height: '5\' 4"'$  (162.6 cm) Weight: 69.5 kg BMI (Calculated): 26.29   Intake/Output last 2 shifts:  08/30 0701 - 08/31 0700 In: 200 [IV Piggyback:200] Out: 1575 [Drains:75]   Physical Exam:  Constitutional: alert, cooperative and no distress; frequently hiccupping  HENT: normocephalic without obvious abnormality  Eyes: PERRL, EOM's grossly intact and symmetric  Respiratory: breathing non-labored at rest  Cardiovascular: regular rate and sinus rhythm  Gastrointestinal: Soft, he does not appear overtly tender, he is mldly distended and tympanic. No rebound/guarding. Percutaneous cholecystostomy tube in RUQ; output seropurulent  Musculoskeletal: no edema or wounds, motor and sensation grossly intact, NT    Labs:     Latest Ref Rng & Units 01/22/2022    4:27 AM 01/20/2022    4:38 AM 01/19/2022    6:40 AM  CBC  WBC 4.0 - 10.5 K/uL 13.2  17.7  22.8   Hemoglobin 13.0 - 17.0 g/dL 10.6  11.0  10.7   Hematocrit 39.0 - 52.0 % 33.1  33.7  33.9   Platelets 150 - 400 K/uL  272  218  211       Latest Ref Rng & Units 01/22/2022    4:27 AM 01/21/2022    4:58 AM 01/20/2022    4:38 AM  CMP  Glucose 70 - 99 mg/dL 229  107  104   BUN 8 - 23 mg/dL 23  44  30   Creatinine 0.61 - 1.24 mg/dL 5.05  7.39  5.85   Sodium 135 - 145 mmol/L 129  125  133   Potassium 3.5 - 5.1 mmol/L 4.0  4.3  3.4   Chloride 98 - 111 mmol/L 90  89  95   CO2 22 - 32 mmol/L '26  23  26   '$ Calcium 8.9 - 10.3 mg/dL 8.1  8.5  8.6   Total Protein 6.5 - 8.1 g/dL   7.1   Total Bilirubin 0.3 - 1.2 mg/dL   0.6   Alkaline Phos 38 - 126 U/L   92   AST 15 - 41 U/L   30   ALT 0 - 44 U/L   21      Imaging studies: No new pertinent imaging studies   Assessment/Plan: (ICD-10's: K81.0) 76 y.o. male with improving leukocytosis, found to have acute cholecystitis s/p percutaneous cholecystostomy tube placement on 75/10, complicated by pertinent comorbidities including deconditioning, ESRD on HD, AMS.   - Appreciate IR assistance with placement of percutaneous cholecystostomy tube; monitor and record output; flush per IR order while in house; will need to flush once daily with 5 ccs NS   -  Monitor hiccups; getting thorazine. May be developing some degree of ileus in setting of acute illness   - Okay to continue diet as tolerated - Continue IV Abx (Rocephin, Flagyl); follow up Cx; tailor PO Abx for home - Monitor abdominal examination   - Monitor leukocytosis; improved - Pain control prn; antiemetics prn  - Further management per primary service; we will follow   All of the above findings and recommendations were discussed with the medical team.   -- Edison Simon, PA-C [facility] 01/22/2022, 7:19 AM M-F: 7am - 4pm

## 2022-01-22 NOTE — Care Management Important Message (Signed)
Important Message  Patient Details  Name: Joseph Mcpherson MRN: [id number] Date of Birth: November 10, 1945   Medicare Important Message Given:  N/A - LOS <3 / Initial given by admissions     [name] A Hasset Chaviano 01/22/2022, 11:46 AM

## 2022-01-22 NOTE — Progress Notes (Signed)
PT Cancellation Note  Patient Details Name: Joseph Mcpherson MRN: [id number] DOB: 05/14/1946   Cancelled Treatment:    Reason Eval/Treat Not Completed: Fatigue/lethargy limiting ability to participate. Chart reviewed. Upon entry to room pt asleep and very lethargic. Pt unable to keep eyes open to talk to author and keeps falling back asleep despite auditory and tactile cuing. RN entering room asking PT to hold as pt very weak and about had fall attempting transfer to chair. Per RN, MD aware of pt's condition. PT to hold at this time and will re-attempt per POC as medically appropriate.    Salem Caster. Fairly IV, PT, DPT Physical Therapist- [facility]  01/22/2022, 3:32 PM

## 2022-01-22 NOTE — Progress Notes (Addendum)
PROGRESS NOTE    Joseph Mcpherson  PIR:[id number] DOB: 12-18-1945 DOA: 01/18/2022 PCP: [name], MD  127A/127A-AA  LOS: 4 days   Brief hospital course: No notes on file  Assessment & Plan: Joseph Mcpherson is a 76 y.o. 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  History is mostly obtained from the patient's wife as he is lethargic and difficult to arouse.  Apparently he had been having fevers up to 101 F for which she had gone to dialysis 2 days ago, but was not dialyzed and returned home. His wife notes that he had slept all day yesterday and was so weak he was unable to stand or ambulate without assistance.   He was noted to have some urinary incontinence prior to arrival.     Acute cholecystitis S/p chole tube on 8/30 --dx by CT c/a/p.   --GenSurg consulted, rec chole tube --started on vanc/cefepime on presentation, then switched to ceftriaxone and flagyl on 8/30 Plan: --cont ceftriaxone and flagyl --chole tube care  Sepsis --On admission patient noted to be febrile up to 101.4 F with mild tachypnea, and labs significant for WBC 34.6 with lactic acid reassuring at 1.  Source acute cholecystitis  Acute metabolic encephalopathy --more somnolent, altered, due to infection.   Hyperkalemia due to missing HD --  Initial potassium 6.7 with signs of T wave peaking noted on EKG.   --Patient has been given Lokelma 10g p.o., albuterol 10 mg neb, calcium gluconate 1 g IV, amp of D50, and 10 units of insulin. --HD per nephrology   ESRD on HD MWF Patient normally dialyzes Monday, Wednesday, Friday, but missed dialysis Friday due to not feeling well.   -HD per nephrology Dr Holley Raring   Weakness Patient noted to be grossly weak needing assistance to ambulate. - CT scan of the brain negative   -PT/OT to evaluate and treat  Poor oral intake --started PTA, but wife said pt eats  little at baseline.  Wife declined temp tube feeds. --Nepro BID   Essential hypertension Home blood pressure medication regimen includes hydralazine 25 mg twice daily, losartan 25 mg daily, and metoprolol 50 mg daily. -Continue current regimen   Hyponatremia, acute --Suspect patient likely at least partially fluid overloaded recent missed dialysis. --monitor Na   Anemia chronic kidney disease   Alzheimer's disease -Delirium precautions -Continue donepezil   Insulin-dependent diabetes mellitus type 2, uncontrolled With Hyperglycemia --A1c 8.1. --Home medication regimen includes Januvia 50 mg daily, NovoLog 10 units subcu nightly, and Levemir 20 units nightly. --hypoglycemic on 8/30 due to NPO Plan: --resume home Levemir at 8u nightly --SSI   Hypothyroidism -Continue levothyroxine   Mixed hyperlipidemia -Continue statin   History of CVA -Continue statin and aspirin   GERD -Continue Protonix   DVT prophylaxis: Heparin SQ Code Status: Full code  Family Communication: wife updated at bedside today Level of care: Telemetry Medical Dispo:   The patient is from: home Anticipated d/c is to: home Anticipated d/c date is: 2-3 days   Subjective and Interval History:  Pt was having hiccups despite oral thorazine.  Eating very little, however, wife does not want temp tube feeding.    Objective: Vitals:   01/22/22 0040 01/22/22 0411 01/22/22 0745 01/22/22 1241  BP: (!) 149/55 (!) 158/61 (!) 164/86 (!) 136/57  Pulse: (!) 57 67 77 73  Resp: '16 17 18 20  '$ Temp: 98.1 F (36.7  C) 98 F (36.7 C) 98.9 F (37.2 C) 98.8 F (37.1 C)  TempSrc:      SpO2: 97% 100% 100% 98%  Weight:      Height:        Intake/Output Summary (Last 24 hours) at 01/22/2022 1544 Last data filed at 01/22/2022 1535 Gross per 24 hour  Intake 330 ml  Output 1615 ml  Net -1285 ml   Filed Weights   01/18/22 1135 01/19/22 1400 01/21/22 1817  Weight: 65.8 kg 70.8 kg 69.5 kg    Examination:    Constitutional: NAD, lethargic HEENT: conjunctivae and lids normal, EOMI CV: No cyanosis.   RESP: normal respiratory effort, on RA Ab: chole tube draining serosanguinous fluids SKIN: warm, dry   Data Reviewed: I have personally reviewed labs and imaging studies  Time spent: 50 minutes  Enzo Bi, MD [facility] If 7PM-7AM, please contact night-coverage 01/22/2022, 3:44 PM

## 2022-01-23 ENCOUNTER — Other Ambulatory Visit: Payer: Self-pay | Admitting: Internal Medicine

## 2022-01-23 DIAGNOSIS — K810 Acute cholecystitis: Secondary | ICD-10-CM | POA: Diagnosis not present

## 2022-01-23 LAB — BASIC METABOLIC PANEL
Anion gap: 11 (ref 5–15)
BUN: 34 mg/dL — ABNORMAL HIGH (ref 8–23)
CO2: 26 mmol/L (ref 22–32)
Calcium: 8.5 mg/dL — ABNORMAL LOW (ref 8.9–10.3)
Chloride: 93 mmol/L — ABNORMAL LOW (ref 98–111)
Creatinine, Ser: 7.15 mg/dL — ABNORMAL HIGH (ref 0.61–1.24)
GFR, Estimated: 7 mL/min — ABNORMAL LOW (ref >60)
Glucose, Bld: 209 mg/dL — ABNORMAL HIGH (ref 70–99)
Potassium: 4.2 mmol/L (ref 3.5–5.1)
Sodium: 130 mmol/L — ABNORMAL LOW (ref 135–145)

## 2022-01-23 LAB — CULTURE, BLOOD (ROUTINE X 2): Culture: NO GROWTH

## 2022-01-23 LAB — CBC
HCT: 31.2 % — ABNORMAL LOW (ref 39.0–52.0)
Hemoglobin: 9.8 g/dL — ABNORMAL LOW (ref 13.0–17.0)
MCH: 27.1 pg (ref 26.0–34.0)
MCHC: 31.4 g/dL (ref 30.0–36.0)
MCV: 86.2 fL (ref 80.0–100.0)
Platelets: 338 10*3/uL (ref 150–400)
RBC: 3.62 MIL/uL — ABNORMAL LOW (ref 4.22–5.81)
RDW: 17.1 % — ABNORMAL HIGH (ref 11.5–15.5)
WBC: 19.5 10*3/uL — ABNORMAL HIGH (ref 4.0–10.5)
nRBC: 0 % (ref 0.0–0.2)

## 2022-01-23 LAB — MAGNESIUM: Magnesium: 2 mg/dL (ref 1.7–2.4)

## 2022-01-23 LAB — GLUCOSE, CAPILLARY
Glucose-Capillary: 225 mg/dL — ABNORMAL HIGH (ref 70–99)
Glucose-Capillary: 276 mg/dL — ABNORMAL HIGH (ref 70–99)
Glucose-Capillary: 222 mg/dL — ABNORMAL HIGH (ref 70–99)

## 2022-01-23 MED ORDER — HYDRALAZINE HCL 50 MG PO TABS
50.0000 mg | ORAL_TABLET | Freq: Three times a day (TID) | ORAL | Status: DC
  Administered 2022-01-23 – 2022-01-25 (×6): 50 mg via ORAL
  Filled 2022-01-23 (×6): qty 1

## 2022-01-23 MED ORDER — INSULIN ASPART 100 UNIT/ML IJ SOLN
0.0000 [IU] | Freq: Three times a day (TID) | INTRAMUSCULAR | Status: DC
  Administered 2022-01-23: 8 [IU] via SUBCUTANEOUS
  Administered 2022-01-24: 3 [IU] via SUBCUTANEOUS
  Administered 2022-01-24: 5 [IU] via SUBCUTANEOUS
  Administered 2022-01-24: 3 [IU] via SUBCUTANEOUS
  Administered 2022-01-25: 5 [IU] via SUBCUTANEOUS
  Administered 2022-01-25: 3 [IU] via SUBCUTANEOUS
  Administered 2022-01-26: 5 [IU] via SUBCUTANEOUS
  Administered 2022-01-27: 2 [IU] via SUBCUTANEOUS
  Administered 2022-01-28: 3 [IU] via SUBCUTANEOUS
  Administered 2022-01-28 – 2022-01-29 (×3): 2 [IU] via SUBCUTANEOUS
  Administered 2022-01-30 – 2022-01-31 (×3): 5 [IU] via SUBCUTANEOUS
  Administered 2022-02-01 – 2022-02-02 (×4): 3 [IU] via SUBCUTANEOUS
  Filled 2022-01-23 (×19): qty 1

## 2022-01-23 MED ORDER — HEPARIN SODIUM (PORCINE) 1000 UNIT/ML IJ SOLN
INTRAMUSCULAR | Status: AC
  Filled 2022-01-23: qty 4

## 2022-01-23 MED ORDER — LOSARTAN POTASSIUM 50 MG PO TABS
50.0000 mg | ORAL_TABLET | Freq: Every evening | ORAL | Status: DC
  Administered 2022-01-24 – 2022-01-25 (×2): 50 mg via ORAL
  Filled 2022-01-23 (×2): qty 1

## 2022-01-23 MED ORDER — INSULIN DETEMIR 100 UNIT/ML ~~LOC~~ SOLN
12.0000 [IU] | Freq: Every day | SUBCUTANEOUS | Status: DC
  Administered 2022-01-23: 12 [IU] via SUBCUTANEOUS
  Filled 2022-01-23 (×2): qty 0.12

## 2022-01-23 MED ORDER — NEPRO/CARBSTEADY PO LIQD
237.0000 mL | Freq: Three times a day (TID) | ORAL | Status: DC
  Administered 2022-01-23 – 2022-02-01 (×18): 237 mL via ORAL

## 2022-01-23 NOTE — Care Management Important Message (Signed)
Important Message  Patient Details  Name: Joseph Mcpherson MRN: [id number] Date of Birth: Mar 01, 1946   Medicare Important Message Given:  Yes     [name] A Alper Guilmette 01/23/2022, 11:35 AM

## 2022-01-23 NOTE — Progress Notes (Signed)
Pt 3.5 hour HD complete w/ no complications. Pt alert, vss, no c/o, report to primary RN. Start: 1535 End: 1905 1535m fluid removed 84L BVP 70.4kg post bed weight No meds w/ HD

## 2022-01-23 NOTE — Progress Notes (Signed)
[facility] SURGICAL PROGRESS NOTE (cpt [id number])  Hospital Day(s): 5  Interval History: Patient seen and examined, no acute events or new complaints overnight. Patient much more lucid this morning; he appears the best I have seen this admission. Some abdominal soreness at drain site. Hiccups improved. Leukocytosis is worse this morning; 19.5K. BMP remains consistent with ESRD. Underwent percutaneous cholecystostomy tube placement with IR 08/30; output 75 ccs; this is now bilious. Cx are growing GPC in pairs. He continues on rocephin and Flagyl. On renal diet.   Review of Systems:  Unable to reliably preform secondary to mental acuity  Vital signs in last 24 hours: [min-max] current  Temp:  [98.1 F (36.7 C)-99.4 F (37.4 C)] 98.7 F (37.1 C) (09/01 3545) Pulse Rate:  [55-77] 60 (09/01 0638) Resp:  [15-20] 16 (09/01 6256) BP: (136-174)/(48-86) 155/48 (09/01 0638) SpO2:  [98 %-100 %] 99 % (09/01 3893)     Height: '5\' 4"'$  (162.6 cm) Weight: 69.5 kg BMI (Calculated): 26.29   Intake/Output last 2 shifts:  08/31 0701 - 09/01 0700 In: 138 [I.V.:3; IV Piggyback:125] Out: 75 [Drains:75]   Physical Exam:  Constitutional: alert, cooperative and no distress; frequently hiccupping  HENT: normocephalic without obvious abnormality  Eyes: PERRL, EOM's grossly intact and symmetric  Respiratory: breathing non-labored at rest  Cardiovascular: regular rate and sinus rhythm  Gastrointestinal: Soft, he does not appear overtly tender, he is mldly distended and tympanic. No rebound/guarding. Percutaneous cholecystostomy tube in RUQ; output bilious Musculoskeletal: no edema or wounds, motor and sensation grossly intact, NT    Labs:     Latest Ref Rng & Units 01/23/2022    5:51 AM 01/22/2022    4:27 AM 01/20/2022    4:38 AM  CBC  WBC 4.0 - 10.5 K/uL 19.5  13.2  17.7   Hemoglobin 13.0 - 17.0 g/dL 9.8  10.6  11.0   Hematocrit 39.0 - 52.0 % 31.2  33.1  33.7   Platelets 150 - 400 K/uL 338   272  218       Latest Ref Rng & Units 01/22/2022    4:27 AM 01/21/2022    4:58 AM 01/20/2022    4:38 AM  CMP  Glucose 70 - 99 mg/dL 229  107  104   BUN 8 - 23 mg/dL 23  44  30   Creatinine 0.61 - 1.24 mg/dL 5.05  7.39  5.85   Sodium 135 - 145 mmol/L 129  125  133   Potassium 3.5 - 5.1 mmol/L 4.0  4.3  3.4   Chloride 98 - 111 mmol/L 90  89  95   CO2 22 - 32 mmol/L '26  23  26   '$ Calcium 8.9 - 10.3 mg/dL 8.1  8.5  8.6   Total Protein 6.5 - 8.1 g/dL   7.1   Total Bilirubin 0.3 - 1.2 mg/dL   0.6   Alkaline Phos 38 - 126 U/L   92   AST 15 - 41 U/L   30   ALT 0 - 44 U/L   21      Imaging studies: No new pertinent imaging studies   Assessment/Plan: (ICD-10's: K81.0) 76 y.o. male with improving leukocytosis, found to have acute cholecystitis s/p percutaneous cholecystostomy tube placement on 73/42, complicated by pertinent comorbidities including deconditioning, ESRD on HD, AMS.   - Appreciate IR assistance with placement of percutaneous cholecystostomy tube; monitor and record output; flush per IR order while in house; will need to flush once daily  with 5 ccs NS   - Okay to continue diet as tolerated - Continue IV Abx (Rocephin, Flagyl); follow up Cx; tailor PO Abx for home - Monitor abdominal examination   - Monitor leukocytosis; slightly worse this morning - Pain control prn; antiemetics prn  - Further management per primary service   - Discharge Planning;Increase in WBC this morning is puzzling but clinically he looks well and drain output now bilious. Will follow peripherally for now; monitor leukocytosis. I will update surgical follow for 3-4 weeks from now. I did leave paper Rx for NS flushes on his chart yesterday. Please call with issues.   All of the above findings and recommendations were discussed with the medical team.   -- Edison Simon, PA-C [facility] 01/23/2022, 7:07 AM M-F: 7am - 4pm

## 2022-01-23 NOTE — Progress Notes (Signed)
   01/23/22 1400  Clinical Encounter Type  Visited With Patient and family together  Visit Type Initial  Referral From Nurse  Consult/Referral To Chaplain   Chaplain responded to call to provide [facility] information to patient and daughter.

## 2022-01-23 NOTE — Progress Notes (Signed)
[facility]  ROUNDING NOTE   Subjective:   Patient seen sitting up in bed, wife at bedside Attempting to eat a piece of toast with cheese States abdominal pain has improved More alert today  Objective:  Vital signs in last 24 hours:  Temp:  [98 F (36.7 C)-99.4 F (37.4 C)] 98 F (36.7 C) (09/01 0809) Pulse Rate:  [55-73] 57 (09/01 0809) Resp:  [14-20] 14 (09/01 0809) BP: (136-174)/(48-73) 163/56 (09/01 0809) SpO2:  [98 %-100 %] 100 % (09/01 0809)  Weight change:  Filed Weights   01/18/22 1135 01/19/22 1400 01/21/22 1817  Weight: 65.8 kg 70.8 kg 69.5 kg    Intake/Output: I/O last 3 completed shifts: In: 338 [I.V.:3; Other:10; IV Piggyback:325] Out: 150 [Drains:150]   Intake/Output this shift:  No intake/output data recorded.  Physical Exam: General: NAD, sitting up in bed  Head: Normocephalic, atraumatic. Moist oral mucosal membranes  Eyes: Anicteric  Lungs:  Clear to auscultation, normal effort, room air  Heart: Regular rate and rhythm  Abdomen:  Soft, nontender  Extremities: No peripheral edema.  Neurologic: Nonfocal, moving all four extremities  Skin: No lesions  Access: Right chest PermCath    Basic Metabolic Panel: Recent Labs  Lab 01/19/22 0640 01/20/22 0438 01/21/22 0458 01/22/22 0427 01/23/22 0551  NA 134* 133* 125* 129* 130*  K 4.3 3.4* 4.3 4.0 4.2  CL 93* 95* 89* 90* 93*  CO2 '27 26 23 26 26  '$ GLUCOSE 214* 104* 107* 229* 209*  BUN 46* 30* 44* 23 34*  CREATININE 9.09* 5.85* 7.39* 5.05* 7.15*  CALCIUM 8.8* 8.6* 8.5* 8.1* 8.5*  MG  --   --   --  1.8 2.0  PHOS 4.3 1.9* 2.3*  --   --      Liver Function Tests: Recent Labs  Lab 01/18/22 1151 01/19/22 0640 01/20/22 0438 01/21/22 0458  AST 11*  --  30  --   ALT 13  --  21  --   ALKPHOS 106  --  92  --   BILITOT 1.5*  --  0.6  --   PROT 8.2*  --  7.1  --   ALBUMIN 3.5 3.1* 2.8*  3.0* 2.5*    No results for input(s): "LIPASE", "AMYLASE" in the last 168 hours. No results  for input(s): "AMMONIA" in the last 168 hours.  CBC: Recent Labs  Lab 01/18/22 1151 01/19/22 0640 01/20/22 0438 01/22/22 0427 01/23/22 0551  WBC 34.6* 22.8* 17.7* 13.2* 19.5*  NEUTROABS 24.9*  --   --   --   --   HGB 12.1* 10.7* 11.0* 10.6* 9.8*  HCT 38.0* 33.9* 33.7* 33.1* 31.2*  MCV 86.8 86.0 86.0 86.2 86.2  PLT 240 211 218 272 338     Cardiac Enzymes: No results for input(s): "CKTOTAL", "CKMB", "CKMBINDEX", "TROPONINI" in the last 168 hours.  BNP: Invalid input(s): "POCBNP"  CBG: Recent Labs  Lab 01/22/22 0744 01/22/22 1220 01/22/22 1610 01/22/22 2024 01/23/22 0749  GLUCAP 177* 265* 248* 301* 225*     Microbiology: Results for orders placed or performed during the hospital encounter of 01/18/22  Blood Culture (routine x 2)     Status: None   Collection Time: 01/18/22 11:51 AM   Specimen: BLOOD  Result Value Ref Range Status   Specimen Description BLOOD LEFT AC  Final   Special Requests   Final    BOTTLES DRAWN AEROBIC AND ANAEROBIC Blood Culture results may not be optimal due to an excessive volume of blood  received in culture bottles   Culture   Final    NO GROWTH 5 DAYS Performed at [facility], [address] [place], [place] [address]    Report Status 01/23/2022 FINAL  Final  Resp Panel by RT-PCR (Flu A&B, Covid) Anterior Nasal Swab     Status: None   Collection Time: 01/18/22 11:51 AM   Specimen: Anterior Nasal Swab  Result Value Ref Range Status   SARS Coronavirus 2 by RT PCR NEGATIVE NEGATIVE Final    Comment: (NOTE) SARS-CoV-2 target nucleic acids are NOT DETECTED.  The SARS-CoV-2 RNA is generally detectable in upper respiratory specimens during the acute phase of infection. The lowest concentration of SARS-CoV-2 viral copies this assay can detect is 138 copies/mL. A negative result does not preclude SARS-Cov-2 infection and should not be used as the sole basis for treatment or other patient management decisions. A negative result  may occur with  improper specimen collection/handling, submission of specimen other than nasopharyngeal swab, presence of viral mutation(s) within the areas targeted by this assay, and inadequate number of viral copies(<138 copies/mL). A negative result must be combined with clinical observations, patient history, and epidemiological information. The expected result is Negative.  Fact Sheet for Patients:  EntrepreneurPulse.com.au  Fact Sheet for Healthcare Providers:  IncredibleEmployment.be  This test is no t yet approved or cleared by the [place] FDA and  has been authorized for detection and/or diagnosis of SARS-CoV-2 by FDA under an Emergency Use Authorization (EUA). This EUA will remain  in effect (meaning this test can be used) for the duration of the COVID-19 declaration under Section 564(b)(1) of the Act, 21 U.S.C.section 360bbb-3(b)(1), unless the authorization is terminated  or revoked sooner.       Influenza A by PCR NEGATIVE NEGATIVE Final   Influenza B by PCR NEGATIVE NEGATIVE Final    Comment: (NOTE) The Xpert Xpress SARS-CoV-2/FLU/RSV plus assay is intended as an aid in the diagnosis of influenza from Nasopharyngeal swab specimens and should not be used as a sole basis for treatment. Nasal washings and aspirates are unacceptable for Xpert Xpress SARS-CoV-2/FLU/RSV testing.  Fact Sheet for Patients: EntrepreneurPulse.com.au  Fact Sheet for Healthcare Providers: IncredibleEmployment.be  This test is not yet approved or cleared by the [place] FDA and has been authorized for detection and/or diagnosis of SARS-CoV-2 by FDA under an Emergency Use Authorization (EUA). This EUA will remain in effect (meaning this test can be used) for the duration of the COVID-19 declaration under Section 564(b)(1) of the Act, 21 U.S.C. section 360bbb-3(b)(1), unless the authorization is terminated  or revoked.  Performed at [facility], [address] [place], [place] [address]   Blood Culture (routine x 2)     Status: None (Preliminary result)   Collection Time: 01/18/22  2:30 PM   Specimen: BLOOD  Result Value Ref Range Status   Specimen Description BLOOD RIGHT ARM  Final   Special Requests   Final    BOTTLES DRAWN AEROBIC AND ANAEROBIC Blood Culture adequate volume   Culture  Setup Time PENDING  Incomplete   Culture   Final    NO GROWTH 5 DAYS Performed at [facility], [address] [place], [place] [address]    Report Status PENDING  Incomplete  Aerobic/Anaerobic Culture w Gram Stain (surgical/deep wound)     Status: None (Preliminary result)   Collection Time: 01/21/22  3:30 PM   Specimen: Abscess  Result Value Ref Range Status   Specimen Description  Final    ABSCESS GALL BLADDER Performed at [facility], [address] [place], [place] [address]    Special Requests   Final    NONE Performed at [facility], [address] [place], [place] [address]    Gram Stain   Final    FEW WBC PRESENT,BOTH PMN AND MONONUCLEAR RARE GRAM POSITIVE COCCI IN PAIRS    Culture   Final    CULTURE REINCUBATED FOR BETTER GROWTH Performed at [facility], [place] [address], [place], [place] [address]    Report Status PENDING  Incomplete    Coagulation Studies: No results for input(s): "LABPROT", "INR" in the last 72 hours.   Urinalysis: No results for input(s): "COLORURINE", "LABSPEC", "PHURINE", "GLUCOSEU", "HGBUR", "BILIRUBINUR", "KETONESUR", "PROTEINUR", "UROBILINOGEN", "NITRITE", "LEUKOCYTESUR" in the last 72 hours.  Invalid input(s): "APPERANCEUR"    Imaging: IR Perc Cholecystostomy  Result Date: 01/21/2022 INDICATION: 76 year old male with acute acalculous cholecystitis, poor surgical candidate. EXAM: Ultrasound and fluoroscopic guided percutaneous cholecystostomy tube placement MEDICATIONS: The patient was  receiving intravenous antibiotics as an inpatient. ANESTHESIA/SEDATION: Moderate (conscious) sedation was employed during this procedure. A total of Versed 0 mg and Fentanyl 25 mcg was administered intravenously. Moderate Sedation Time: 0 minutes. The patient's level of consciousness and vital signs were monitored continuously by radiology nursing throughout the procedure under my direct supervision. FLUOROSCOPY TIME:  73.2 mGy COMPLICATIONS: None immediate. PROCEDURE: Informed written consent was obtained from the patient after a thorough discussion of the procedural risks, benefits and alternatives. All questions were addressed. Maximal Sterile Barrier Technique was utilized including caps, mask, sterile gowns, sterile gloves, sterile drape, hand hygiene and skin antiseptic. A timeout was performed prior to the initiation of the procedure. The patient was placed supine on the angiographic table. The patient's right upper quadrant was then prepped and draped in normal sterile fashion with maximum sterile barrier. Ultrasound demonstrates a distended gallbladder. Subdermal Local anesthesia was provided at the planned skin entry site. Under ultrasound guidance, deeper local anesthetic was provided through intercostal muscles and along the liver capsule. Ultrasound was used to puncture the gallbladder using a 21 gauge Chiba needle via a transhepatic approach with visualization of the lung treated to the gallbladder. A 0.018 inch wire was advanced into the lumen and a transition dilator placed. A gentle hand injection of contrast was performed. Cholecystogram demonstrates the cystic duct is obstructed. A 0.035 inch exchange wire was placed in the tract was dilated. A 10.2 French multipurpose drainage catheter was advanced into the gallbladder lumen. The drain was then secured in place using a 0-silk suture and a Stayfix device. A sterile dressing was applied. The tube was placed to bag drainage. A culture was sent to  the lab for analysis. The patient tolerated procedure well without evidence of immediate complication was transferred back to the floor in stable condition. IMPRESSION: Successful placement of percutaneous, transhepatic cholecystostomy tube. Ruthann Cancer, MD [facility] Electronically Signed   By: Ruthann Cancer M.D.   On: 01/21/2022 16:08     Medications:    cefTRIAXone (ROCEPHIN)  IV 2 g (01/22/22 2152)   chlorproMAZINE (THORAZINE) 12.5 mg in sodium chloride 0.9 % 25 mL IVPB 12.5 mg (01/23/22 0422)   metronidazole 500 mg (01/23/22 0851)    aspirin EC  81 mg Oral Daily   Chlorhexidine Gluconate Cloth  6 each Topical Q0600   donepezil  10 mg Oral Daily   feeding supplement (NEPRO CARB STEADY)  237 mL Oral BID BM   gabapentin  300 mg Oral QHS   heparin  5,000 Units Subcutaneous Q8H   hydrALAZINE  25 mg Oral BID   insulin aspart  0-15 Units Subcutaneous TID WC   insulin detemir  12 Units Subcutaneous QHS   levothyroxine  137 mcg Oral Q0600   loratadine  10 mg Oral QPM   losartan  25 mg Oral QPM   metoprolol succinate  50 mg Oral Daily   pantoprazole  40 mg Oral Daily   sevelamer carbonate  2.4 g Oral Q breakfast   simvastatin  20 mg Oral q1800   sodium chloride flush  3 mL Intravenous Q12H   acetaminophen **OR** acetaminophen, albuterol, alum & mag hydroxide-simeth, chlorproMAZINE (THORAZINE) 12.5 mg in sodium chloride 0.9 % 25 mL IVPB  Assessment/ Plan:  [name] is a 76 y.o.  male with a past medical history of hypertension, end-stage renal disease, on hemodialysis on Monday Wednesday Friday schedule, diabetes mellitus, CVA who was brought to the ER with chief complaint of weakness.  [facility]/MWF/right chest PermCath  End-stage renal disease on hemodialysis.    Scheduled to receive dialysis later today.  UF goal 1 to 1.5 L as tolerated.  Next treatment scheduled for Monday.  2. Anemia of chronic kidney  disease  Lab Results  Component Value Date   HGB 9.8 (L) 01/23/2022    Hemoglobin remains within acceptable target.  No need for ESA's.  3. Secondary Hyperparathyroidism: with outpatient labs: PTH 422, phosphorus 6.4, calcium 9.1 on 12/29/2021.   Lab Results  Component Value Date   CALCIUM 8.5 (L) 01/23/2022   CAION 0.83 (LL) 12/10/2020   PHOS 2.3 (L) 01/21/2022    Calcium stable today, 8.5.  We will continue to monitor.  4.  Hypertension with chronic kidney disease.  Home regimen includes hydralazine and losartan.  Currently receiving these medications along with metoprolol.  Blood pressure 163/56.  5. Diabetes mellitus type II with chronic kidney disease insulin dependent. Home regimen includes NovoLog and Levemir. Most recent hemoglobin A1c is 8.1 on 01/18/2022.  Currently prescribed Januvia as well.   Glucose remains elevated, primary team to manage sliding scale insulin.  6.  Acute cholecystitis seen on CT.  Surgery consulted.  IR placed percutaneous drain on 01/21/22 and stabilizing with medications. Will plan for surgery at later date.   LOS: [place] 9/1/202310:46 AM

## 2022-01-23 NOTE — Progress Notes (Signed)
Post HD Rn assessment

## 2022-01-23 NOTE — Progress Notes (Signed)
PROGRESS NOTE    Joseph Mcpherson  HYQ:[id number] DOB: December 28, 1945 DOA: 01/18/2022 PCP: [name], MD  127A/127A-AA  LOS: 5 days   Brief hospital course: No notes on file  Assessment & Plan: Joseph Mcpherson is a 76 y.o. 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  History is mostly obtained from the patient's wife as he is lethargic and difficult to arouse.  Apparently he had been having fevers up to 101 F for which she had gone to dialysis 2 days ago, but was not dialyzed and returned home. His wife notes that he had slept all day yesterday and was so weak he was unable to stand or ambulate without assistance.   He was noted to have some urinary incontinence prior to arrival.     Acute cholecystitis S/p chole tube on 8/30 --dx by CT c/a/p.   --GenSurg consulted, rec chole tube --started on vanc/cefepime on presentation, then switched to ceftriaxone and flagyl on 8/30 Plan: --cont ceftriaxone and flagyl --chole tube care and flushes --outpatient surgical f/u in 3-4 weeks --Percutaneous cholecystostomy drain to remain in place at least 6 weeks. Recommend fluoroscopy with injection of the drain in IR to evaluate for patency of the cystic duct.  Sepsis --On admission patient noted to be febrile up to 101.4 F with mild tachypnea, and labs significant for WBC 34.6 with lactic acid reassuring at 1.  Source acute cholecystitis  Acute metabolic encephalopathy --somnolent, altered, due to infection.  Started to become more alert today.   Hyperkalemia due to missing HD --  Initial potassium 6.7 with signs of T wave peaking noted on EKG.   --Patient has been given Lokelma 10g p.o., albuterol 10 mg neb, calcium gluconate 1 g IV, amp of D50, and 10 units of insulin. --HD per nephrology   ESRD on HD MWF Patient normally dialyzes Monday, Wednesday, Friday, but missed dialysis Friday  due to not feeling well.   -HD per nephrology   Weakness Patient noted to be grossly weak needing assistance to ambulate. - CT scan of the brain negative   -PT/OT to evaluate and treat  Poor oral intake --started PTA, but wife said pt eats little at baseline.  Wife declined temp tube feeds. --Nepro TID   Essential hypertension --cont Toprol --increase hydralazine to 50 mg q8h --increase losartan to 50 mg every evening   Hyponatremia, acute --Suspect patient likely at least partially fluid overloaded recent missed dialysis. --monitor Na   Anemia chronic kidney disease   Alzheimer's disease -Delirium precautions -Continue donepezil   Insulin-dependent diabetes mellitus type 2, uncontrolled With Hyperglycemia --A1c 8.1. --Home medication regimen includes Januvia 50 mg daily, NovoLog 10 units subcu nightly, and Levemir 20 units nightly. --hypoglycemic on 8/30 due to NPO Plan: --increase home Levemir to 12u nightly --increase SSI to mod scale   Hypothyroidism -Continue levothyroxine   Mixed hyperlipidemia -Continue statin   History of CVA -Continue statin and aspirin   GERD -Continue Protonix   DVT prophylaxis: Heparin SQ Code Status: Full code  Family Communication: wife and daughter updated at bedside today Level of care: Telemetry Medical Dispo:   The patient is from: home Anticipated d/c is to: home Anticipated d/c date is: 1-2 days   Subjective and Interval History:  Hiccups keep returning after thorazine wears off.  Pt was more alert and awake this morning, and wife said pt started eating.  Objective: Vitals:   01/23/22 0118 01/23/22 0638 01/23/22 0809 01/23/22 1147  BP: (!) 148/73 (!) 155/48 (!) 163/56 (!) 189/73  Pulse: (!) 55 60 (!) 57 60  Resp: '15 16 14 17  '$ Temp: 99 F (37.2 C) 98.7 F (37.1 C) 98 F (36.7 C) 98.4 F (36.9 C)  TempSrc:      SpO2: 100% 99% 100% 100%  Weight:      Height:        Intake/Output Summary (Last 24  hours) at 01/23/2022 1525 Last data filed at 01/23/2022 0645 Gross per 24 hour  Intake 133 ml  Output 35 ml  Net 98 ml   Filed Weights   01/18/22 1135 01/19/22 1400 01/21/22 1817  Weight: 65.8 kg 70.8 kg 69.5 kg    Examination:   Constitutional: NAD, more alert today and talking HEENT: conjunctivae and lids normal, EOMI CV: No cyanosis.   RESP: normal respiratory effort, on RA GI: chole drain outputting cloudy brown liquid   Data Reviewed: I have personally reviewed labs and imaging studies  Time spent: 35 minutes  Enzo Bi, MD [facility] If 7PM-7AM, please contact night-coverage 01/23/2022, 3:25 PM

## 2022-01-23 NOTE — Progress Notes (Signed)
Referring Physician(s): Edison Simon, [place]  Supervising Physician: Jacqulynn Cadet  Patient Status:  [facility] - In-pt  Chief Complaint: Acute cholecystitis s/p percutaneous cholecystostomy in IR 01/21/22 by Dr. Serafina Royals.  Subjective: Patient in bed sleepy but able to answer questions. His wife and daughter are at the bedside. Patient states he is feeling better.   Allergies: Patient has no known allergies.  Medications: Prior to Admission medications   Medication Sig Start Date End Date Taking? Authorizing Provider  aspirin EC 81 MG tablet Take 81 mg by mouth daily. Swallow whole.   Yes [provider]  B Complex-C-Folic Acid (RENA-VITE RX) 1 MG TABS Take 1 tablet by mouth daily.  12/07/18  Yes [provider]  calcium acetate (PHOSLO) 667 MG capsule TAKE 2 CAPSULES BY MOUTH 3 TIMES A DAY WITH MEAL 05/19/21  Yes Masoud, Viann Shove, MD  Cholecalciferol (VITAMIN D3) 5000 units TABS Take 5,000 Units by mouth daily.    Yes [provider]  docusate sodium (COLACE) 100 MG capsule TAKE 1 CAPSULE BY MOUTH TWICE A DAY 10/06/21  Yes Masoud, Viann Shove, MD  donepezil (ARICEPT) 10 MG tablet TAKE 1 TABLET BY MOUTH EVERY DAY 04/22/21  Yes Masoud, Viann Shove, MD  gabapentin (NEURONTIN) 300 MG capsule TAKE 1 CAPSULE BY MOUTH EVERY DAY AT BEDTIME AS DIRECTED 11/05/21  Yes Masoud, Viann Shove, MD  hydrALAZINE (APRESOLINE) 50 MG tablet Take 1 tablet (50 mg total) by mouth 3 (three) times daily. Patient taking differently: Take 25 mg by mouth in the morning and at bedtime. 10/16/19  Yes Masoud, Viann Shove, MD  insulin aspart (NOVOLOG) 100 UNIT/ML FlexPen Inject 10 Units into the skin at bedtime.   Yes [provider]  insulin detemir (LEVEMIR) 100 UNIT/ML injection Inject 20 Units into the skin at bedtime.   Yes [provider]  levocetirizine (XYZAL) 5 MG tablet TAKE 1 TABLET BY MOUTH EVERY DAY IN THE EVENING 01/07/21  Yes Masoud, Viann Shove, MD  levothyroxine (SYNTHROID) 137 MCG tablet TAKE 1  TABLET BY MOUTH EVERY DAY 03/24/21  Yes Masoud, Viann Shove, MD  losartan (COZAAR) 25 MG tablet TAKE 1 TABLET BY MOUTH EVERY EVENING 04/22/21  Yes Masoud, Viann Shove, MD  metoprolol succinate (TOPROL-XL) 50 MG 24 hr tablet TAKE 1 TABLET BY MOUTH EVERY DAY 04/22/21  Yes Masoud, Viann Shove, MD  Multiple Vitamins-Minerals (MULTIVITAMIN WITH MINERALS) tablet Take 1 tablet by mouth daily.   Yes [provider]  pantoprazole (PROTONIX) 40 MG tablet TAKE 1 TABLET BY MOUTH EVERY DAY 07/09/21  Yes Masoud, Viann Shove, MD  sevelamer carbonate (RENVELA) 2.4 g PACK Take 2.4 g by mouth daily. 04/26/19  Yes [provider]  simvastatin (ZOCOR) 20 MG tablet TAKE 1 TABLET BY MOUTH EVERY DAY IN THE EVENING 03/03/21  Yes Masoud, Viann Shove, MD  sitaGLIPtin (JANUVIA) 50 MG tablet Take 50 mg by mouth every evening.    Yes [provider]  sodium chloride flush (NS) 0.9 % SOLN Inject 5 mLs into the vein daily. Flush cholecystostomy tube daily with 5 ccs of NS 01/22/22 03/23/22 Yes Edison Simon R, PA-C  albuterol (PROVENTIL) (2.5 MG/3ML) 0.083% nebulizer solution Take 3 mLs (2.5 mg total) by nebulization every 6 (six) hours as needed for wheezing or shortness of breath. 10/28/20   [name], MD  BD PEN NEEDLE NANO 2ND GEN 32G X 4 MM MISC USE AS DIRECTED 02/24/21   [name], MD  Continuous Blood Gluc Sensor (FREESTYLE LIBRE 14 DAY SENSOR) MISC APPLY EVERY 14 (FOURTEEN) DAYS. 11/07/21   Masoud,  Viann Shove, MD     Vital Signs: BP (!) 163/56 (BP Location: Right Arm)   Pulse (!) 57   Temp 98 F (36.7 C)   Resp 14   Ht $R'5\' 4"'MQ$  (1.626 m)   Wt 153 lb 3.5 oz (69.5 kg)   SpO2 100%   BMI 26.30 kg/m   Physical Exam Constitutional:      General: He is not in acute distress. Cardiovascular:     Comments: RIJ dialysis catheter Pulmonary:     Effort: Pulmonary effort is normal.  Abdominal:     Palpations: Abdomen is soft.     Tenderness: There is no abdominal tenderness.     Comments: RUQ drain to gravity.  Approximately 100 ml of brown/tan fluid in bag. Drain easily flushed with 5 ml NS. Dressing is clean, dry and intact.   Skin:    General: Skin is warm and dry.  Neurological:     Mental Status: He is alert.     Imaging: IR Perc Cholecystostomy  Result Date: 01/21/2022 INDICATION: 76 year old male with acute acalculous cholecystitis, poor surgical candidate. EXAM: Ultrasound and fluoroscopic guided percutaneous cholecystostomy tube placement MEDICATIONS: The patient was receiving intravenous antibiotics as an inpatient. ANESTHESIA/SEDATION: Moderate (conscious) sedation was employed during this procedure. A total of Versed 0 mg and Fentanyl 25 mcg was administered intravenously. Moderate Sedation Time: 0 minutes. The patient's level of consciousness and vital signs were monitored continuously by radiology nursing throughout the procedure under my direct supervision. FLUOROSCOPY TIME:  02.7 mGy COMPLICATIONS: None immediate. PROCEDURE: Informed written consent was obtained from the patient after a thorough discussion of the procedural risks, benefits and alternatives. All questions were addressed. Maximal Sterile Barrier Technique was utilized including caps, mask, sterile gowns, sterile gloves, sterile drape, hand hygiene and skin antiseptic. A timeout was performed prior to the initiation of the procedure. The patient was placed supine on the angiographic table. The patient's right upper quadrant was then prepped and draped in normal sterile fashion with maximum sterile barrier. Ultrasound demonstrates a distended gallbladder. Subdermal Local anesthesia was provided at the planned skin entry site. Under ultrasound guidance, deeper local anesthetic was provided through intercostal muscles and along the liver capsule. Ultrasound was used to puncture the gallbladder using a 21 gauge Chiba needle via a transhepatic approach with visualization of the lung treated to the gallbladder. A 0.018 inch wire was  advanced into the lumen and a transition dilator placed. A gentle hand injection of contrast was performed. Cholecystogram demonstrates the cystic duct is obstructed. A 0.035 inch exchange wire was placed in the tract was dilated. A 10.2 French multipurpose drainage catheter was advanced into the gallbladder lumen. The drain was then secured in place using a 0-silk suture and a Stayfix device. A sterile dressing was applied. The tube was placed to bag drainage. A culture was sent to the lab for analysis. The patient tolerated procedure well without evidence of immediate complication was transferred back to the floor in stable condition. IMPRESSION: Successful placement of percutaneous, transhepatic cholecystostomy tube. Ruthann Cancer, MD [facility] Electronically Signed   By: Ruthann Cancer M.D.   On: 01/21/2022 16:08   CT CHEST ABDOMEN PELVIS W CONTRAST  Result Date: 01/20/2022 CLINICAL DATA:  Sepsis, fever of undetermined source. EXAM: CT CHEST, ABDOMEN, AND PELVIS WITH CONTRAST TECHNIQUE: Multidetector CT imaging of the chest, abdomen and pelvis was performed following the standard protocol during bolus administration of intravenous contrast. RADIATION DOSE REDUCTION: This exam was  performed according to the departmental dose-optimization program which includes automated exposure control, adjustment of the mA and/or kV according to patient size and/or use of iterative reconstruction technique. CONTRAST:  129m OMNIPAQUE IOHEXOL 300 MG/ML  SOLN COMPARISON:  None Available. FINDINGS: CT CHEST FINDINGS Cardiovascular: No significant vascular findings. Normal heart size. No pericardial effusion. Large-bore venous catheter in the RIGHT atrium Mediastinum/Nodes: No axillary or supraclavicular adenopathy. No mediastinal or hilar adenopathy. No pericardial fluid. Esophagus normal. Lungs/Pleura: No suspicious pulmonary nodules. Normal pleural. Airways normal.  No pneumonia. Musculoskeletal: No aggressive osseous lesion. CT ABDOMEN AND PELVIS FINDINGS Hepatobiliary: The gallbladder wall is thickened and hazy. Small amount of pericholecystic fluid. There is irregular gallbladder wall enhancement. There is haziness within the peritoneal fat adjacent the gallbladder fundus. The gallbladder is mildly distended at 4.6 cm. No radiodense gallstones are evident. Common bile duct is normal caliber. No intrahepatic duct dilatation. Pancreas: Pancreas is normal. No ductal dilatation. No pancreatic inflammation. Spleen: Normal spleen Adrenals/urinary tract: Adrenal glands normal. Bilateral renal cortical thinning. Benign-appearing cysts in the kidneys. Ureters bladder normal. The bladder is thick-walled but predominantly collapsed. Stomach/Bowel: Stomach, small bowel, appendix, and cecum are normal. The colon and rectosigmoid colon are normal. Vascular/Lymphatic: Abdominal aorta is normal caliber with atherosclerotic calcification. There is no retroperitoneal or periportal lymphadenopathy. No pelvic lymphadenopathy. Reproductive: Unremarkable Other: No free fluid. Musculoskeletal: No aggressive osseous lesion. IMPRESSION: Chest Impression: 1. No evidence of pulmonary infection. Abdomen / Pelvis Impression: 1. Gallbladder wall thickening with pericholecystic fluid and irregular gallbladder wall enhancement. Findings are concerning for ACUTE CHOLECYSTITIS. No radiodense gallstones are noted (potential acalculous cholecystitis versus non radiodense gallstones). 2. Bilateral renal cortical thinning.  Benign bilateral renal cysts. 3. Bladder wall thickening. These results will be called to the ordering clinician or representative by the Radiologist Assistant, and communication documented in the PACS or [employer] Electronically Signed   By: SSuzy BouchardM.D.   On: 01/20/2022 21:06    Labs:  CBC: Recent Labs    01/19/22 0640 01/20/22 0438 01/22/22 0427 01/23/22 0551   WBC 22.8* 17.7* 13.2* 19.5*  HGB 10.7* 11.0* 10.6* 9.8*  HCT 33.9* 33.7* 33.1* 31.2*  PLT 211 218 272 338    COAGS: Recent Labs    01/18/22 1151  INR 1.2  APTT 35    BMP: Recent Labs    01/20/22 0438 01/21/22 0458 01/22/22 0427 01/23/22 0551  NA 133* 125* 129* 130*  K 3.4* 4.3 4.0 4.2  CL 95* 89* 90* 93*  CO2 26 23 26 26   GLUCOSE 104* 107* 229* 209*  BUN 30* 44* 23 34*  CALCIUM 8.6* 8.5* 8.1* 8.5*  CREATININE 5.85* 7.39* 5.05* 7.15*  GFRNONAA 9* 7* 11* 7*    LIVER FUNCTION TESTS: Recent Labs    01/18/22 1151 01/19/22 0640 01/20/22 0438 01/21/22 0458  BILITOT 1.5*  --  0.6  --   AST 11*  --  30  --   ALT 13  --  21  --   ALKPHOS 106  --  92  --   PROT 8.2*  --  7.1  --   ALBUMIN 3.5 3.1* 2.8*  3.0* 2.5*    Assessment and Plan:  Acute cholecystitis s/p percutaneous cholecystostomy in IR 01/21/22 by Dr. SSerafina Royals  Drain Location: RUQ Size: Fr size: 10 Fr Date of placement: 01/21/22  Currently to: Drain collection device: gravity 24 hour output:  Output by Drain (mL) 01/21/22 0700 - 01/21/22 1459 01/21/22 1500 - 01/21/22 2259 01/21/22 2300 -  01/22/22 0659 01/22/22 0700 - 01/22/22 1459 01/22/22 1500 - 01/22/22 2259 01/22/22 2300 - 01/23/22 0659 01/23/22 0700 - 01/23/22 1137  Biliary Tube 10 Fr. RUQ  75  40  35     Interval imaging/drain manipulation:  None  Current examination: Flushes/aspirates easily.  Insertion site unremarkable. Suture and stat lock in place. Dressed appropriately.   Plan: Continue TID flushes with 5 cc NS. Record output Q shift. Dressing changes QD or PRN if soiled.  Call IR APP or on call IR MD if difficulty flushing or sudden change in drain output.   Discharge planning: Percutaneous cholecystostomy drain to remain in place at least 6 weeks. Recommend fluoroscopy with injection of the drain in IR to evaluate for patency of the cystic duct. If the duct is patent and general surgery feels patient is stable for  cholecystectomy, the drain would be removed at time of surgery. If the duct is patent and general surgery feels patient is NEVER a candidate for cholecystectomy, drain can be capped for a trial. If symptoms recur, then place to gravity bag again. If trial is successful, discuss possible removal of the drain.  Outpatient orders are in place. A scheduler from our clinic will call the patient after discharge with a date/time of his follow up appointment. I met with the patient and his family (wife, daughter) at the bedside to discuss drain care, follow up, etc. I demonstrated how to flush the drain and empty the gravity bag. They know to keep the site clean and dry, document the daily output, flush the drain once daily and to call our office with any questions/concerns prior to their visit. A prescription for NS flushes has been provided to the patient by the Surgical team.   IR will continue to follow - please call with questions or concerns.  Electronically Signed: Soyla Dryer, AGACNP-BC [phone] 01/23/2022, 10:43 AM   I spent a total of 15 Minutes at the the patient's bedside AND on the patient's hospital floor or unit, greater than 50% of which was counseling/coordinating care for percutaneous cholecystostomy

## 2022-01-23 NOTE — Progress Notes (Signed)
Pre HD RN assessment 

## 2022-01-23 NOTE — TOC Initial Note (Signed)
Transition of Care [facility]) - Initial/Assessment Note    Patient Details  Name: Joseph Mcpherson MRN: [id number] Date of Birth: [birth year]  Transition of Care [facility]) CM/SW Contact:    Coralee Pesa, [place] Phone Number: 01/23/2022, 5:01 PM  Clinical Narrative:                 CSW spoke with pt's daughter and discussed change in recommendation. Family is agreeable to SNF with the eventual goal of pt returning home. They do not have a SNF preference and agreeable to fax out. Medicare. Gov information provided. Pt receives HD [facility] MWF @ 5:30. CSW will complete fax out. TOC will continue to follow for DC needs.  Expected Discharge Plan: Skilled Nursing Facility Barriers to Discharge: Ship broker, Continued Medical Work up, SNF Pending bed offer   Patient Goals and CMS Choice Patient states their goals for this hospitalization and ongoing recovery are:: Pt not available for goal setting CMS Medicare.gov Compare Post Acute Care list provided to:: Patient Represenative (must comment) Choice offered to / list presented to : Spouse, Adult Children  Expected Discharge Plan and Services Expected Discharge Plan: [place] Acute Care Choice: [place] Living arrangements for the past 2 months: Single Family Home                                      Prior Living Arrangements/Services Living arrangements for the past 2 months: Single Family Home Lives with:: Spouse Patient language and need for interpreter reviewed:: Yes Do you feel safe going back to the place where you live?: Yes      Need for Family Participation in Patient Care: Yes (Comment) Care giver support system in place?: Yes (comment)   Criminal Activity/Legal Involvement Pertinent to Current Situation/Hospitalization: No - Comment as needed  Activities of Daily Living      Permission Sought/Granted Permission sought to share information with : Family  Supports Permission granted to share information with : Yes, Verbal Permission Granted  Share Information with NAME: Steva Ready or [name]     Permission granted to share info w Relationship: Spouse and daughter     Emotional Assessment Appearance:: Appears stated age     Orientation: : Oriented to Self Alcohol / Substance Use: Not Applicable Psych Involvement: No (comment)  Admission diagnosis:  Hyperkalemia [E87.5] Febrile illness [R50.9] Sepsis ([place]) [A41.9] Patient Active Problem List   Diagnosis Date Noted   Cholecystitis, acute    Sepsis ([place]) 01/18/2022   Hyperkalemia 01/18/2022   Generalized weakness 01/18/2022   Hyponatremia 01/18/2022   Acute bronchitis and bronchiolitis 06/16/2021   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   Allergies 10/28/2020   Gastroesophageal reflux disease with esophagitis 07/18/2020   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   Annual physical exam 03/17/2020   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   Acute CVA (cerebrovascular accident) ([place]) 05/04/2019   Hemodialysis status ([place]) 05/04/2019   History of CVA (cerebrovascular accident) 20/25/4270   Complication of vascular access for dialysis 04/27/2019   Surgery follow-up examination 03/09/2019   ESRD (end stage renal disease) on dialysis ([place]) 02/09/2018   Anemia in chronic kidney disease 02/09/2018   Iron deficiency anemia, unspecified 02/09/2018   Pruritus, unspecified 02/09/2018   Secondary hyperparathyroidism of renal origin ([place]) 02/09/2018   Peripheral vascular disease ([place]) 03/16/2016   Headache, unspecified 03/16/2016  Diabetic neuropathy associated with diabetes mellitus due to underlying condition ([place]) 03/16/2016   Mixed hyperlipidemia 07/15/2015   Hypothyroidism 07/14/2015   Hearing impaired 03/19/2015   Type 2 diabetes mellitus with diabetic neuropathy, unspecified ([place]) 03/13/2015   Neurocognitive deficits 03/13/2015   Essential hypertension  11/27/2014   Insulin dependent type 2 diabetes mellitus ([place]) 11/19/2014   Pulmonary embolism ([place]) 09/13/2014   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[place]) 05/31/2014   Constipation 10/14/2013   PCP:  [name], MD Pharmacy:   [id number] [id number]: [phone]: [phone]    Social Determinants of Health (SDOH) Interventions    Readmission Risk Interventions     No data to display

## 2022-01-23 NOTE — Progress Notes (Signed)
Physical Therapy Treatment Patient Details Name: Joseph Mcpherson MRN: [id number] DOB: 08-07-1945 Today's Date: 01/23/2022   History of Present Illness Joseph Mcpherson is a 76 y.o. 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    PT Comments    Pt received upright in bed remaining lethargic but more awake and alert compared to yesterday. Pt declining in functional capacity with need for bed features and modA to transfer from supine to seated EOB reliant on RW to stand with minA and VC's for hand placement. Frequent buckling of knees in standing and heavy UE use required for RW.  Pt ambulating total ~60' with minguard and VC's for use of RW keeping closer to BOS. Frequent standing rest breaks needed with VC's for turning RW and returning to room as pt noticeably fatiguing with gait with more frequent buckling of LE's and anterior trunk lean over RW. Pt returning to sitting EoB with max cues for safe sitting and modA to return to supine in bed. Daughter present throughout session agreeable on need to update recs to STR due to progressive weakness and poor use of AD at this time. Pt and daughter educated on sitting LE therex and use of incentive spirometer. TOC team updated on d/c recs. Pt left upright in bed with all needs in reach.    Recommendations for follow up therapy are one component of a multi-disciplinary discharge planning process, led by the attending physician.  Recommendations may be updated based on patient status, additional functional criteria and insurance authorization.  Follow Up Recommendations  Skilled nursing-short term rehab (<3 hours/day) Can patient physically be transported by private vehicle: Yes   Assistance Recommended at Discharge Frequent or constant Supervision/Assistance  Patient can return home with the following A lot of help with walking and/or transfers;Assist  for transportation;Assistance with cooking/housework;Help with stairs or ramp for entrance   Equipment Recommendations  Rolling walker (2 wheels)    Recommendations for Other Services       Precautions / Restrictions Precautions Precautions: Fall Restrictions Weight Bearing Restrictions: No     Mobility  Bed Mobility Overal bed mobility: Needs Assistance Bed Mobility: Supine to Sit     Supine to sit: Min assist, HOB elevated Sit to supine: Mod assist     Patient Response: Cooperative  Transfers Overall transfer level: Needs assistance Equipment used: Rolling walker (2 wheels) Transfers: Sit to/from Stand Sit to Stand: Mod assist           General transfer comment: VC's for hand placement on RW    Ambulation/Gait Ambulation/Gait assistance: Min guard Gait Distance (Feet): 60 Feet Assistive device: Rolling walker (2 wheels) Gait Pattern/deviations: Step-to pattern, Decreased step length - right, Decreased step length - left, Drifts right/left, Trunk flexed, Narrow base of support       General Gait Details: Pt weaker demonstrating need for RW this date for gait. Mod to max VC's for use of RW and frequent stnading rest breaks needed with LE buckling during gait.   Stairs             Wheelchair Mobility    Modified Rankin (Stroke Patients Only)       Balance Overall balance assessment: Needs assistance Sitting-balance support: Feet supported, Bilateral upper extremity supported, No upper extremity supported Sitting balance-Leahy Scale: Fair       Standing balance-Leahy Scale: Poor Standing balance comment: Reliant on RW with buckling  of knees                            Cognition                                                Exercises Other Exercises Other Exercises: need for RW, educated on incentive spirometer use (daily, every 2-3 hours)    General Comments General comments (skin integrity, edema,  etc.): HR at rest mid 60's up to 85-86 BPM with ambulation.      Pertinent Vitals/Pain Pain Assessment Pain Assessment: Faces Faces Pain Scale: Hurts a little bit Pain Location: RUQ qith hiccups/cough Pain Descriptors / Indicators: Grimacing, Guarding Pain Intervention(s): Limited activity within patient's tolerance, Monitored during session, Repositioned    Home Living                          Prior Function            PT Goals (current goals can now be found in the care plan section) Acute Rehab PT Goals Patient Stated Goal: go to [facility] for his vacation PT Goal Formulation: With patient Time For Goal Achievement: 02/02/22 Potential to Achieve Goals: Poor Progress towards PT goals: Progressing toward goals;Not progressing toward goals - comment    Frequency    Min 2X/week      PT Plan Discharge plan needs to be updated    Co-evaluation              AM-PAC PT "6 Clicks" Mobility   Outcome Measure  Help needed turning from your back to your side while in a flat bed without using bedrails?: A Little Help needed moving from lying on your back to sitting on the side of a flat bed without using bedrails?: A Little Help needed moving to and from a bed to a chair (including a wheelchair)?: A Lot Help needed standing up from a chair using your arms (e.g., wheelchair or bedside chair)?: A Lot Help needed to walk in hospital room?: A Lot Help needed climbing 3-5 steps with a railing? : A Lot 6 Click Score: 14    End of Session Equipment Utilized During Treatment: Gait belt Activity Tolerance: Patient limited by fatigue Patient left: in bed;with call bell/phone within reach;with bed alarm set;with nursing/sitter in room Nurse Communication: Mobility status PT Visit Diagnosis: Unsteadiness on feet (R26.81);Muscle weakness (generalized) (M62.81)     Time: 8242-3536 PT Time Calculation (min) (ACUTE ONLY): 28 min  Charges:  $Gait Training: 23-37  mins                     Joseph Mcpherson. Fairly IV, PT, DPT Physical Therapist- [facility]  01/23/2022, 3:25 PM

## 2022-01-23 NOTE — NC FL2 (Addendum)
[place] LEVEL OF CARE SCREENING TOOL     IDENTIFICATION  Patient Name: Joseph Mcpherson Birthdate: 1946-04-05 Sex: male Admission Date (Current Location): 01/18/2022  [facility] and [place] Number:  Engineering geologist and Address:  [facility], [address], [place], [place] [address]      Provider Number: [id number]  Attending Physician Name and Address:  Enzo Bi, MD  Relative Name and Phone Number:  Dewayne Severe, [phone]    Current Level of Care: Hospital Recommended Level of Care: [place] Prior Approval Number:    Date Approved/Denied:   PASRR Number: [id number]  Discharge Plan: SNF    Current Diagnoses: Patient Active Problem List   Diagnosis Date Noted   Cholecystitis, acute    Sepsis ([place]) 01/18/2022   Hyperkalemia 01/18/2022   Generalized weakness 01/18/2022   Hyponatremia 01/18/2022   Acute bronchitis and bronchiolitis 06/16/2021   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   Allergies 10/28/2020   Gastroesophageal reflux disease with esophagitis 07/18/2020   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   Annual physical exam 03/17/2020   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   Acute CVA (cerebrovascular accident) ([place]) 05/04/2019   Hemodialysis status ([place]) 05/04/2019   History of CVA (cerebrovascular accident) 25/49/8264   Complication of vascular access for dialysis 04/27/2019   Surgery follow-up examination 03/09/2019   ESRD (end stage renal disease) on dialysis ([place]) 02/09/2018   Anemia in chronic kidney disease 02/09/2018   Iron deficiency anemia, unspecified 02/09/2018   Pruritus, unspecified 02/09/2018   Secondary hyperparathyroidism of renal origin ([place]) 02/09/2018   Peripheral vascular disease ([place]) 03/16/2016   Headache, unspecified 03/16/2016   Diabetic neuropathy associated with diabetes mellitus due to underlying condition ([place]) 03/16/2016   Mixed  hyperlipidemia 07/15/2015   Hypothyroidism 07/14/2015   Hearing impaired 03/19/2015   Type 2 diabetes mellitus with diabetic neuropathy, unspecified ([place]) 03/13/2015   Neurocognitive deficits 03/13/2015   Essential hypertension 11/27/2014   Insulin dependent type 2 diabetes mellitus ([place]) 11/19/2014   Pulmonary embolism ([place]) 09/13/2014   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[place]) 05/31/2014   Constipation 10/14/2013    Orientation RESPIRATION BLADDER Height & Weight     Self, Time, Situation, Place  Normal Continent Weight: 153 lb 3.5 oz (69.5 kg) Height:  '5\' 4"'$  (162.6 cm)  BEHAVIORAL SYMPTOMS/MOOD NEUROLOGICAL BOWEL NUTRITION STATUS      Continent Diet (See DC summary)  AMBULATORY STATUS COMMUNICATION OF NEEDS Skin   Limited Assist Verbally Normal                       Personal Care Assistance Level of Assistance  Bathing, Feeding, Dressing Bathing Assistance: Limited assistance Feeding assistance: Independent Dressing Assistance: Limited assistance     Functional Limitations Info  Sight, Hearing, Speech Sight Info: Adequate Hearing Info: Adequate Speech Info: Adequate    SPECIAL CARE FACTORS FREQUENCY  PT (By licensed PT), OT (By licensed OT)     PT Frequency: 5x week OT Frequency: 5x week            Contractures Contractures Info: Not present    Additional Factors Info  Code Status, Allergies, Psychotropic, Insulin Sliding Scale Code Status Info: Full Allergies Info: NKA Psychotropic Info: Donepezil Insulin Sliding Scale Info: See DC summary       Current Medications (01/23/2022):  This is the current hospital active medication list Current Facility-Administered Medications  Medication Dose Route Frequency Provider Last Rate  Last Admin   acetaminophen (TYLENOL) tablet 650 mg  650 mg Oral Q6H PRN Fuller Plan A, MD   650 mg at 01/22/22 0247   Or   acetaminophen (TYLENOL) suppository 650 mg  650 mg Rectal Q6H PRN Fuller Plan A, MD        albuterol (PROVENTIL) (2.5 MG/3ML) 0.083% nebulizer solution 2.5 mg  2.5 mg Nebulization Q6H PRN Fuller Plan A, MD       alum & mag hydroxide-simeth (MAALOX/MYLANTA) 200-200-20 MG/5ML suspension 30 mL  30 mL Oral Q6H PRN Fuller Plan A, MD   30 mL at 01/18/22 2239   aspirin EC tablet 81 mg  81 mg Oral Daily Anisa Leanos, Rondell A, MD   81 mg at 01/23/22 0845   cefTRIAXone (ROCEPHIN) 2 g in sodium chloride 0.9 % 100 mL IVPB  2 g Intravenous Q24H Enzo Bi, MD 200 mL/hr at 01/22/22 2152 2 g at 01/22/22 2152   Chlorhexidine Gluconate Cloth 2 % PADS 6 each  6 each Topical Q0600 Liana Gerold, MD   6 each at 01/23/22 0610   chlorproMAZINE (THORAZINE) 12.5 mg in sodium chloride 0.9 % 25 mL IVPB  12.5 mg Intravenous Q6H PRN Enzo Bi, MD 50 mL/hr at 01/23/22 1223 12.5 mg at 01/23/22 1223   donepezil (ARICEPT) tablet 10 mg  10 mg Oral Daily Fuller Plan A, MD   10 mg at 01/23/22 0845   feeding supplement (NEPRO CARB STEADY) liquid 237 mL  237 mL Oral TID BM Enzo Bi, MD   237 mL at 01/23/22 1318   gabapentin (NEURONTIN) capsule 300 mg  300 mg Oral QHS Shantale Holtmeyer, Rondell A, MD   300 mg at 01/22/22 2139   heparin injection 5,000 Units  5,000 Units Subcutaneous Azzie Roup, MD   5,000 Units at 01/23/22 1316   hydrALAZINE (APRESOLINE) tablet 50 mg  50 mg Oral Q8H Enzo Bi, MD       insulin aspart (novoLOG) injection 0-15 Units  0-15 Units Subcutaneous TID WC Enzo Bi, MD   8 Units at 01/23/22 1156   insulin detemir (LEVEMIR) injection 12 Units  12 Units Subcutaneous QHS Enzo Bi, MD       levothyroxine (SYNTHROID) tablet 137 mcg  137 mcg Oral Q0600 Fuller Plan A, MD   137 mcg at 01/23/22 0606   loratadine (CLARITIN) tablet 10 mg  10 mg Oral QPM Annica Marinello, Rondell A, MD   10 mg at 01/22/22 1740   losartan (COZAAR) tablet 50 mg  50 mg Oral QPM Enzo Bi, MD       metoprolol succinate (TOPROL-XL) 24 hr tablet 50 mg  50 mg Oral Daily Tamala Julian, Rondell A, MD   50 mg at 01/23/22 0845   metroNIDAZOLE (FLAGYL)  IVPB 500 mg  500 mg Intravenous Q12H Enzo Bi, MD 100 mL/hr at 01/23/22 0851 500 mg at 01/23/22 0851   pantoprazole (PROTONIX) EC tablet 40 mg  40 mg Oral Daily Fuller Plan A, MD   40 mg at 01/23/22 0845   sevelamer carbonate (RENVELA) powder PACK 2.4 g  2.4 g Oral Q breakfast Tamala Julian, Rondell A, MD   2.4 g at 01/23/22 0750   simvastatin (ZOCOR) tablet 20 mg  20 mg Oral q1800 Quinetta Shilling, Rondell A, MD   20 mg at 01/22/22 1740   sodium chloride flush (NS) 0.9 % injection 3 mL  3 mL Intravenous Q12H Fuller Plan A, MD   3 mL at 01/23/22 0915     Discharge Medications: Please see  discharge summary for a list of discharge medications.  Relevant Imaging Results:  Relevant Lab Results:   Additional Information SS# [id number] [place] MWF, 5:30 chair time  Coralee Pesa, LCSWA

## 2022-01-23 NOTE — Inpatient Diabetes Management (Signed)
Inpatient Diabetes Program Recommendations  AACE/ADA: New Consensus Statement on Inpatient Glycemic Control (2015)  Target Ranges:  Prepandial:   less than 140 mg/dL      Peak postprandial:   less than 180 mg/dL (1-2 hours)      Critically ill patients:  140 - 180 mg/dL    Latest Reference Range & Units 01/23/22 07:49 01/23/22 11:51  Glucose-Capillary 70 - 99 mg/dL 225 (H) 276 (H)  (H): Data is abnormally high    Outpatient Diabetes meds:  Lantus 20 units QHS Novolog 10 units QHS Januvia 50 mg Daily   Current orders:  Novolog 0-15 units TID Levemir 12 units QHS     MD- Note Levemir and Novolog SSI doses both increased today  If afternoon CBGs remain an issue, may consider starting low dose Novolog Meal Coverage:  Novolog 3 units TID with meals HOLD if pt eats <50% meals HOLD if pt NPO    --Will follow patient during hospitalization--  Wyn Quaker RN, MSN, [place] Diabetes Coordinator Inpatient Glycemic Control Team Team Pager: [phone] (8a-5p)

## 2022-01-24 ENCOUNTER — Inpatient Hospital Stay: Payer: Medicare HMO

## 2022-01-24 DIAGNOSIS — K810 Acute cholecystitis: Secondary | ICD-10-CM | POA: Diagnosis not present

## 2022-01-24 LAB — BASIC METABOLIC PANEL
Anion gap: 11 (ref 5–15)
BUN: 23 mg/dL (ref 8–23)
CO2: 27 mmol/L (ref 22–32)
Calcium: 8.2 mg/dL — ABNORMAL LOW (ref 8.9–10.3)
Chloride: 95 mmol/L — ABNORMAL LOW (ref 98–111)
Creatinine, Ser: 4.72 mg/dL — ABNORMAL HIGH (ref 0.61–1.24)
GFR, Estimated: 12 mL/min — ABNORMAL LOW (ref >60)
Glucose, Bld: 168 mg/dL — ABNORMAL HIGH (ref 70–99)
Potassium: 4 mmol/L (ref 3.5–5.1)
Sodium: 133 mmol/L — ABNORMAL LOW (ref 135–145)

## 2022-01-24 LAB — CBC
HCT: 29.6 % — ABNORMAL LOW (ref 39.0–52.0)
Hemoglobin: 9.4 g/dL — ABNORMAL LOW (ref 13.0–17.0)
MCH: 27.2 pg (ref 26.0–34.0)
MCHC: 31.8 g/dL (ref 30.0–36.0)
MCV: 85.5 fL (ref 80.0–100.0)
Platelets: 335 10*3/uL (ref 150–400)
RBC: 3.46 MIL/uL — ABNORMAL LOW (ref 4.22–5.81)
RDW: 17 % — ABNORMAL HIGH (ref 11.5–15.5)
WBC: 20 10*3/uL — ABNORMAL HIGH (ref 4.0–10.5)
nRBC: 0 % (ref 0.0–0.2)

## 2022-01-24 LAB — MAGNESIUM: Magnesium: 1.8 mg/dL (ref 1.7–2.4)

## 2022-01-24 LAB — GLUCOSE, CAPILLARY
Glucose-Capillary: 168 mg/dL — ABNORMAL HIGH (ref 70–99)
Glucose-Capillary: 172 mg/dL — ABNORMAL HIGH (ref 70–99)
Glucose-Capillary: 245 mg/dL — ABNORMAL HIGH (ref 70–99)
Glucose-Capillary: 208 mg/dL — ABNORMAL HIGH (ref 70–99)

## 2022-01-24 LAB — PROCALCITONIN: Procalcitonin: 4.54 ng/mL

## 2022-01-24 MED ORDER — IOHEXOL 300 MG/ML  SOLN
100.0000 mL | Freq: Once | INTRAMUSCULAR | Status: AC | PRN
Start: 2022-01-24 — End: 2022-01-24
  Administered 2022-01-24: 100 mL via INTRAVENOUS

## 2022-01-24 MED ORDER — INSULIN DETEMIR 100 UNIT/ML ~~LOC~~ SOLN
16.0000 [IU] | Freq: Every day | SUBCUTANEOUS | Status: DC
Start: 2022-01-24 — End: 2022-01-25
  Administered 2022-01-24: 16 [IU] via SUBCUTANEOUS
  Filled 2022-01-24: qty 0.16

## 2022-01-24 MED ORDER — IOHEXOL 9 MG/ML PO SOLN
500.0000 mL | ORAL | Status: AC
  Administered 2022-01-24: 500 mL via ORAL

## 2022-01-24 MED ORDER — METHOCARBAMOL 1000 MG/10ML IJ SOLN
500.0000 mg | Freq: Four times a day (QID) | INTRAVENOUS | Status: DC | PRN
  Administered 2022-01-26 – 2022-01-28 (×4): 500 mg via INTRAVENOUS
  Filled 2022-01-24 (×2): qty 5
  Filled 2022-01-24: qty 500
  Filled 2022-01-24: qty 5
  Filled 2022-01-24: qty 500
  Filled 2022-01-24: qty 5

## 2022-01-24 NOTE — TOC Progression Note (Signed)
Transition of Care [facility]) - Progression Note    Patient Details  Name: Joseph Mcpherson MRN: [id number] Date of Birth: 1945/08/30  [facility]) CM/SW Contact  Izola Price, RN Phone Number: 01/24/2022, 12:15 PM  Clinical Narrative: Left VM with daughter to call RN CM back re: locating [place] SNF/STR options. Simmie Davies RN CM        Expected Discharge Plan: Skilled Nursing Facility Barriers to Discharge: Ship broker, Continued Medical Work up, SNF Pending bed offer  Expected Discharge Plan and Services Expected Discharge Plan: [place] Choice: [place] arrangements for the past 2 months: Single Family Home                                       Social Determinants of Health (SDOH) Interventions    Readmission Risk Interventions     No data to display

## 2022-01-24 NOTE — Progress Notes (Signed)
Per Dr Billie Ruddy, dc tele monitoring

## 2022-01-24 NOTE — Progress Notes (Signed)
PROGRESS NOTE    Joseph Mcpherson  BOF:[id number] DOB: November 08, 1945 DOA: 01/18/2022 PCP: [name], MD  127A/127A-AA  LOS: 6 days   Brief hospital course: No notes on file  Assessment & Plan: Joseph Mcpherson is a 76 y.o. 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  History is mostly obtained from the patient's wife as he is lethargic and difficult to arouse.  Apparently he had been having fevers up to 101 F for which she had gone to dialysis 2 days ago, but was not dialyzed and returned home. His wife notes that he had slept all day yesterday and was so weak he was unable to stand or ambulate without assistance.   He was noted to have some urinary incontinence prior to arrival.     Acute cholecystitis S/p chole tube on 8/30 --dx by CT c/a/p.   --GenSurg consulted, rec chole tube --started on vanc/cefepime on presentation, then switched to ceftriaxone and flagyl on 8/30 Plan: --CT a/p w contrast today due to worsening leukocytosis --cont ceftriaxone and flagyl --chole tube care and flushes --outpatient surgical f/u in 3-4 weeks --Percutaneous cholecystostomy drain to remain in place at least 6 weeks. Recommend fluoroscopy with injection of the drain in IR to evaluate for patency of the cystic duct.  Leukocytosis worsening --CT a/p w contrast today   Sepsis --On admission patient noted to be febrile up to 101.4 F with mild tachypnea, and labs significant for WBC 34.6 with lactic acid reassuring at 1.  Source acute cholecystitis  Acute metabolic encephalopathy, improved --somnolent, altered, due to infection.  Started to become more alert on 9/1.   Hyperkalemia due to missing HD --  Initial potassium 6.7 with signs of T wave peaking noted on EKG.   --Patient has been given Lokelma 10g p.o., albuterol 10 mg neb, calcium gluconate 1 g IV, amp of D50, and 10 units of  insulin. --monitor   ESRD on HD MWF Patient normally dialyzes Monday, Wednesday, Friday, but missed dialysis Friday due to not feeling well.   -HD per nephrology   Weakness Patient noted to be grossly weak needing assistance to ambulate. - CT scan of the brain negative   --PT/OT rec SNF rehab  Poor oral intake --started PTA, but wife said pt eats little at baseline.  Wife declined temp tube feeds. --Nepro TID   Essential hypertension --cont Toprol --cont hydralazine 50 mg q8h --cont losartan 50 mg q evening   Hyponatremia, acute --Suspect patient likely at least partially fluid overloaded recent missed dialysis. --monitor Na   Anemia chronic kidney disease   Alzheimer's disease -Delirium precautions -Continue donepezil   Insulin-dependent diabetes mellitus type 2, uncontrolled With Hyperglycemia --A1c 8.1. --Home medication regimen includes Januvia 50 mg daily, NovoLog 10 units subcu nightly, and Levemir 20 units nightly. --hypoglycemic on 8/30 due to NPO Plan: --increase Levemir to 16u nightly --SSI mod scale   Hypothyroidism -Continue levothyroxine   Mixed hyperlipidemia -Continue statin   History of CVA -Continue statin and aspirin   GERD -Continue Protonix   DVT prophylaxis: Heparin SQ Code Status: Full code  Family Communication: daughter updated at bedside today Level of care: Med-Surg Dispo:   The patient is from: home Anticipated d/c is to: SNF rehab Anticipated d/c date is: 2-3 days   Subjective and Interval History:  Pt continues to have hiccups.    More alert and eating more now.  WBC continues to trend up.     Objective: Vitals:   01/23/22 2043 01/24/22 0024 01/24/22 0752 01/24/22 1140  BP: (!) 158/64 (!) 149/56 (!) 151/59 (!) 121/52  Pulse: 74 69 64 61  Resp: '17 16 18 18  '$ Temp: 98.9 F (37.2 C) 98.3 F (36.8 C) 98.1 F (36.7 C) 98.6 F (37 C)  TempSrc:   Oral   SpO2: 99% 99% 100% 98%  Weight:      Height:         Intake/Output Summary (Last 24 hours) at 01/24/2022 1533 Last data filed at 01/24/2022 1055 Gross per 24 hour  Intake 248 ml  Output 1580 ml  Net -1332 ml   Filed Weights   01/21/22 1817 01/23/22 1535 01/23/22 1918  Weight: 69.5 kg 71.3 kg 70.4 kg    Examination:   Constitutional: NAD, alert, oriented HEENT: conjunctivae and lids normal, EOMI CV: No cyanosis.   RESP: normal respiratory effort, on RA Neuro: II - XII grossly intact.     Data Reviewed: I have personally reviewed labs and imaging studies  Time spent: 50 minutes  Enzo Bi, MD [facility] If 7PM-7AM, please contact night-coverage 01/24/2022, 3:33 PM

## 2022-01-24 NOTE — Progress Notes (Signed)
[facility]  ROUNDING NOTE   Subjective:   Patient seen sitting up in bed, family member at bedside Family member reports that his hiccups seem to effect him the most after he eats.  States abdominal pain has improved alert today able to maintain conversation   Objective:  Vital signs in last 24 hours:  Temp:  [97.9 F (36.6 C)-98.9 F (37.2 C)] 98.1 F (36.7 C) (09/02 0752) Pulse Rate:  [60-74] 64 (09/02 0752) Resp:  [14-20] 18 (09/02 0752) BP: (149-208)/(56-95) 151/59 (09/02 0752) SpO2:  [99 %-100 %] 100 % (09/02 0752) Weight:  [70.4 kg-71.3 kg] 70.4 kg (09/01 1918)  Weight change:  Filed Weights   01/21/22 1817 01/23/22 1535 01/23/22 1918  Weight: 69.5 kg 71.3 kg 70.4 kg    Intake/Output: I/O last 3 completed shifts: In: 256 [P.O.:240; I.V.:6; Other:10] Out: 3818 [EXHBZJ:696; Other:1500]   Intake/Output this shift:  No intake/output data recorded.  Physical Exam: General: NAD, sitting up in bed, hiccups   Head: Normocephalic, atraumatic. Moist oral mucosal membranes  Eyes: Anicteric  Lungs:  Clear to auscultation, normal effort, room air  Heart: Regular rate and rhythm  Abdomen:  Soft, nontender  Extremities: No peripheral edema.  Neurologic: Nonfocal, moving all four extremities  Skin: No lesions  Access: Right chest PermCath    Basic Metabolic Panel: Recent Labs  Lab 01/19/22 0640 01/20/22 0438 01/21/22 0458 01/22/22 0427 01/23/22 0551 01/24/22 0501  NA 134* 133* 125* 129* 130* 133*  K 4.3 3.4* 4.3 4.0 4.2 4.0  CL 93* 95* 89* 90* 93* 95*  CO2 '27 26 23 26 26 27  '$ GLUCOSE 214* 104* 107* 229* 209* 168*  BUN 46* 30* 44* 23 34* 23  CREATININE 9.09* 5.85* 7.39* 5.05* 7.15* 4.72*  CALCIUM 8.8* 8.6* 8.5* 8.1* 8.5* 8.2*  MG  --   --   --  1.8 2.0 1.8  PHOS 4.3 1.9* 2.3*  --   --   --      Liver Function Tests: Recent Labs  Lab 01/18/22 1151 01/19/22 0640 01/20/22 0438 01/21/22 0458  AST 11*  --  30  --   ALT 13  --  21  --    ALKPHOS 106  --  92  --   BILITOT 1.5*  --  0.6  --   PROT 8.2*  --  7.1  --   ALBUMIN 3.5 3.1* 2.8*  3.0* 2.5*    No results for input(s): "LIPASE", "AMYLASE" in the last 168 hours. No results for input(s): "AMMONIA" in the last 168 hours.  CBC: Recent Labs  Lab 01/18/22 1151 01/19/22 0640 01/20/22 0438 01/22/22 0427 01/23/22 0551 01/24/22 0501  WBC 34.6* 22.8* 17.7* 13.2* 19.5* 20.0*  NEUTROABS 24.9*  --   --   --   --   --   HGB 12.1* 10.7* 11.0* 10.6* 9.8* 9.4*  HCT 38.0* 33.9* 33.7* 33.1* 31.2* 29.6*  MCV 86.8 86.0 86.0 86.2 86.2 85.5  PLT 240 211 218 272 338 335     Cardiac Enzymes: No results for input(s): "CKTOTAL", "CKMB", "CKMBINDEX", "TROPONINI" in the last 168 hours.  BNP: Invalid input(s): "POCBNP"  CBG: Recent Labs  Lab 01/22/22 2024 01/23/22 0749 01/23/22 1151 01/23/22 2135 01/24/22 0758  GLUCAP 301* 225* 276* 222* 168*     Microbiology: Results for orders placed or performed during the hospital encounter of 01/18/22  Blood Culture (routine x 2)     Status: None   Collection Time: 01/18/22 11:51 AM  Specimen: BLOOD  Result Value Ref Range Status   Specimen Description BLOOD LEFT AC  Final   Special Requests   Final    BOTTLES DRAWN AEROBIC AND ANAEROBIC Blood Culture results may not be optimal due to an excessive volume of blood received in culture bottles   Culture   Final    NO GROWTH 5 DAYS Performed at [facility], [address] [place], [place] [address]    Report Status 01/23/2022 FINAL  Final  Resp Panel by RT-PCR (Flu A&B, Covid) Anterior Nasal Swab     Status: None   Collection Time: 01/18/22 11:51 AM   Specimen: Anterior Nasal Swab  Result Value Ref Range Status   SARS Coronavirus 2 by RT PCR NEGATIVE NEGATIVE Final    Comment: (NOTE) SARS-CoV-2 target nucleic acids are NOT DETECTED.  The SARS-CoV-2 RNA is generally detectable in upper respiratory specimens during the acute phase of infection. The  lowest concentration of SARS-CoV-2 viral copies this assay can detect is 138 copies/mL. A negative result does not preclude SARS-Cov-2 infection and should not be used as the sole basis for treatment or other patient management decisions. A negative result may occur with  improper specimen collection/handling, submission of specimen other than nasopharyngeal swab, presence of viral mutation(s) within the areas targeted by this assay, and inadequate number of viral copies(<138 copies/mL). A negative result must be combined with clinical observations, patient history, and epidemiological information. The expected result is Negative.  Fact Sheet for Patients:  EntrepreneurPulse.com.au  Fact Sheet for Healthcare Providers:  IncredibleEmployment.be  This test is no t yet approved or cleared by the [place] FDA and  has been authorized for detection and/or diagnosis of SARS-CoV-2 by FDA under an Emergency Use Authorization (EUA). This EUA will remain  in effect (meaning this test can be used) for the duration of the COVID-19 declaration under Section 564(b)(1) of the Act, 21 U.S.C.section 360bbb-3(b)(1), unless the authorization is terminated  or revoked sooner.       Influenza A by PCR NEGATIVE NEGATIVE Final   Influenza B by PCR NEGATIVE NEGATIVE Final    Comment: (NOTE) The Xpert Xpress SARS-CoV-2/FLU/RSV plus assay is intended as an aid in the diagnosis of influenza from Nasopharyngeal swab specimens and should not be used as a sole basis for treatment. Nasal washings and aspirates are unacceptable for Xpert Xpress SARS-CoV-2/FLU/RSV testing.  Fact Sheet for Patients: EntrepreneurPulse.com.au  Fact Sheet for Healthcare Providers: IncredibleEmployment.be  This test is not yet approved or cleared by the [place] FDA and has been authorized for detection and/or diagnosis of SARS-CoV-2 by FDA under  an Emergency Use Authorization (EUA). This EUA will remain in effect (meaning this test can be used) for the duration of the COVID-19 declaration under Section 564(b)(1) of the Act, 21 U.S.C. section 360bbb-3(b)(1), unless the authorization is terminated or revoked.  Performed at [facility], [address] [place], [place] [address]   Blood Culture (routine x 2)     Status: None (Preliminary result)   Collection Time: 01/18/22  2:30 PM   Specimen: BLOOD  Result Value Ref Range Status   Specimen Description BLOOD RIGHT ARM  Final   Special Requests   Final    BOTTLES DRAWN AEROBIC AND ANAEROBIC Blood Culture adequate volume   Culture  Setup Time PENDING  Incomplete   Culture   Final    NO GROWTH 5 DAYS Performed at [facility], [address] [place], [place] [address]  Report Status PENDING  Incomplete  Aerobic/Anaerobic Culture w Gram Stain (surgical/deep wound)     Status: None (Preliminary result)   Collection Time: 01/21/22  3:30 PM   Specimen: Abscess  Result Value Ref Range Status   Specimen Description   Final    ABSCESS GALL BLADDER Performed at [facility], [address], [place], [place] [address]    Special Requests   Final    NONE Performed at [facility], [address] [place], [place] [address]    Gram Stain   Final    FEW WBC PRESENT,BOTH PMN AND MONONUCLEAR RARE GRAM POSITIVE COCCI IN PAIRS    Culture   Final    FEW GRAM NEGATIVE RODS MODERATE GRAM POSITIVE COCCI IDENTIFICATION AND SUSCEPTIBILITIES TO FOLLOW HOLDING FOR POSSIBLE ANAEROBE Performed at [facility], [place] [address] [place], [place] [address]    Report Status PENDING  Incomplete    Coagulation Studies: No results for input(s): "LABPROT", "INR" in the last 72 hours.   Urinalysis: No results for input(s): "COLORURINE", "LABSPEC", "PHURINE", "GLUCOSEU", "HGBUR", "BILIRUBINUR", "KETONESUR", "PROTEINUR", "UROBILINOGEN", "NITRITE",  "LEUKOCYTESUR" in the last 72 hours.  Invalid input(s): "APPERANCEUR"    Imaging: No results found.   Medications:    cefTRIAXone (ROCEPHIN)  IV 2 g (01/23/22 2255)   chlorproMAZINE (THORAZINE) 12.5 mg in sodium chloride 0.9 % 25 mL IVPB 12.5 mg (01/23/22 1223)   metronidazole 500 mg (01/24/22 0828)    aspirin EC  81 mg Oral Daily   Chlorhexidine Gluconate Cloth  6 each Topical Q0600   donepezil  10 mg Oral Daily   feeding supplement (NEPRO CARB STEADY)  237 mL Oral TID BM   gabapentin  300 mg Oral QHS   heparin  5,000 Units Subcutaneous Q8H   hydrALAZINE  50 mg Oral Q8H   insulin aspart  0-15 Units Subcutaneous TID WC   insulin detemir  12 Units Subcutaneous QHS   levothyroxine  137 mcg Oral Q0600   loratadine  10 mg Oral QPM   losartan  50 mg Oral QPM   metoprolol succinate  50 mg Oral Daily   pantoprazole  40 mg Oral Daily   sevelamer carbonate  2.4 g Oral Q breakfast   simvastatin  20 mg Oral q1800   sodium chloride flush  3 mL Intravenous Q12H   acetaminophen **OR** acetaminophen, albuterol, alum & mag hydroxide-simeth, chlorproMAZINE (THORAZINE) 12.5 mg in sodium chloride 0.9 % 25 mL IVPB  Assessment/ Plan:  [name] is a 76 y.o.  male with a past medical history of hypertension, end-stage renal disease, on hemodialysis on Monday Wednesday Friday schedule, diabetes mellitus, CVA who was brought to the ER with chief complaint of weakness.  [facility]/MWF/right chest PermCath  End-stage renal disease on hemodialysis.    Dialysis not indicated today. Next treatment scheduled for Monday.  2. Anemia of chronic kidney disease  Lab Results  Component Value Date   HGB 9.4 (L) 01/24/2022    Hemoglobin remains within acceptable target.  No need for ESA's.  3. Secondary Hyperparathyroidism: with outpatient labs: PTH 422, phosphorus 6.4, calcium 9.1 on 12/29/2021.   Lab Results  Component Value Date   CALCIUM 8.2 (L) 01/24/2022   CAION 0.83 (LL) 12/10/2020    PHOS 2.3 (L) 01/21/2022    Calcium stable today, 8.2.  We will continue to monitor.  4.  Hypertension with chronic kidney disease.  Home regimen includes hydralazine and losartan.  Currently receiving these medications  along with metoprolol.  Blood pressure 151/59  5. Diabetes mellitus type II with chronic kidney disease insulin dependent. Home regimen includes NovoLog and Levemir. Most recent hemoglobin A1c is 8.1 on 01/18/2022.  Currently prescribed Januvia as well.   Glucose remains elevated, primary team to manage sliding scale insulin.  6.  Acute cholecystitis seen on CT.  Surgery consulted.  IR placed percutaneous drain on 01/21/22 and stabilizing with medications. Will plan for surgery at later date.   LOS: [place] 9/2/20239:57 AM

## 2022-01-25 DIAGNOSIS — K810 Acute cholecystitis: Secondary | ICD-10-CM | POA: Diagnosis not present

## 2022-01-25 LAB — GLUCOSE, CAPILLARY
Glucose-Capillary: 70 mg/dL (ref 70–99)
Glucose-Capillary: 195 mg/dL — ABNORMAL HIGH (ref 70–99)
Glucose-Capillary: 248 mg/dL — ABNORMAL HIGH (ref 70–99)
Glucose-Capillary: 255 mg/dL — ABNORMAL HIGH (ref 70–99)

## 2022-01-25 LAB — CBC
HCT: 31.1 % — ABNORMAL LOW (ref 39.0–52.0)
Hemoglobin: 9.8 g/dL — ABNORMAL LOW (ref 13.0–17.0)
MCH: 27.2 pg (ref 26.0–34.0)
MCHC: 31.5 g/dL (ref 30.0–36.0)
MCV: 86.4 fL (ref 80.0–100.0)
Platelets: 399 10*3/uL (ref 150–400)
RBC: 3.6 MIL/uL — ABNORMAL LOW (ref 4.22–5.81)
RDW: 17 % — ABNORMAL HIGH (ref 11.5–15.5)
WBC: 20.9 10*3/uL — ABNORMAL HIGH (ref 4.0–10.5)
nRBC: 0 % (ref 0.0–0.2)

## 2022-01-25 LAB — BASIC METABOLIC PANEL
Anion gap: 11 (ref 5–15)
BUN: 31 mg/dL — ABNORMAL HIGH (ref 8–23)
CO2: 23 mmol/L (ref 22–32)
Calcium: 8.3 mg/dL — ABNORMAL LOW (ref 8.9–10.3)
Chloride: 93 mmol/L — ABNORMAL LOW (ref 98–111)
Creatinine, Ser: 6.19 mg/dL — ABNORMAL HIGH (ref 0.61–1.24)
GFR, Estimated: 9 mL/min — ABNORMAL LOW (ref >60)
Glucose, Bld: 108 mg/dL — ABNORMAL HIGH (ref 70–99)
Potassium: 4.1 mmol/L (ref 3.5–5.1)
Sodium: 127 mmol/L — ABNORMAL LOW (ref 135–145)

## 2022-01-25 LAB — MAGNESIUM: Magnesium: 1.9 mg/dL (ref 1.7–2.4)

## 2022-01-25 LAB — PROCALCITONIN: Procalcitonin: 3.66 ng/mL

## 2022-01-25 MED ORDER — INSULIN ASPART 100 UNIT/ML IJ SOLN
3.0000 [IU] | Freq: Three times a day (TID) | INTRAMUSCULAR | Status: DC
  Administered 2022-01-26 – 2022-01-30 (×6): 3 [IU] via SUBCUTANEOUS
  Filled 2022-01-25 (×9): qty 1

## 2022-01-25 MED ORDER — HYDRALAZINE HCL 50 MG PO TABS
100.0000 mg | ORAL_TABLET | Freq: Three times a day (TID) | ORAL | Status: DC
  Administered 2022-01-25 – 2022-02-02 (×23): 100 mg via ORAL
  Filled 2022-01-25 (×24): qty 2

## 2022-01-25 MED ORDER — INSULIN DETEMIR 100 UNIT/ML ~~LOC~~ SOLN
12.0000 [IU] | Freq: Every day | SUBCUTANEOUS | Status: DC
Start: 2022-01-25 — End: 2022-01-28
  Administered 2022-01-25 – 2022-01-27 (×3): 12 [IU] via SUBCUTANEOUS
  Filled 2022-01-25 (×3): qty 0.12

## 2022-01-25 NOTE — Progress Notes (Signed)
PROGRESS NOTE    [name]  VQQ:[id number] DOB: May 03, 1946 DOA: 01/18/2022 PCP: [name], MD  127A/127A-AA  LOS: 7 days   Brief hospital course: No notes on file  Assessment & Plan: Joseph Mcpherson is a 76 y.o. 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  History is mostly obtained from the patient's wife as he is lethargic and difficult to arouse.  Apparently he had been having fevers up to 101 F for which she had gone to dialysis 2 days ago, but was not dialyzed and returned home. His wife notes that he had slept all day yesterday and was so weak he was unable to stand or ambulate without assistance.   He was noted to have some urinary incontinence prior to arrival.     Acute cholecystitis S/p chole tube on 8/30 --dx by CT c/a/p.   --GenSurg consulted, rec chole tube --started on vanc/cefepime on presentation, then switched to ceftriaxone and flagyl on 8/30 --CT a/p w contrast 9/2 showed no abscess formation or other acute finding other than cholecystitis Plan: --cont ceftriaxone and flagyl --chole tube care and flushes --outpatient surgical f/u in 3-4 weeks --Percutaneous cholecystostomy drain to remain in place at least 6 weeks. Recommend fluoroscopy with injection of the drain in IR to evaluate for patency of the cystic duct.  Leukocytosis worsening --CT a/p w contrast 9/2 showed no abscess formation or other acute finding other than cholecystitis  Sepsis --On admission patient noted to be febrile up to 101.4 F with mild tachypnea, and labs significant for WBC 34.6 with lactic acid reassuring at 1.  Source acute cholecystitis  Acute metabolic encephalopathy, improved --somnolent, altered, due to infection.  Started to become more alert on 9/1.   Hyperkalemia due to missing HD --  Initial potassium 6.7 with signs of T wave peaking noted on EKG.   --Patient  has been given Lokelma 10g p.o., albuterol 10 mg neb, calcium gluconate 1 g IV, amp of D50, and 10 units of insulin. --monitor   ESRD on HD MWF Patient normally dialyzes Monday, Wednesday, Friday, but missed dialysis Friday due to not feeling well.   -HD per nephrology   Weakness Patient noted to be grossly weak needing assistance to ambulate. - CT scan of the brain negative   --PT/OT rec SNF rehab  Poor oral intake --started PTA, but wife said pt eats little at baseline.  Wife declined temp tube feeds. --Nepro TID   Essential hypertension --cont Toprol --increase hydralazine to 100 mg q8h --cont losartan 50 mg q evening   Hyponatremia, acute --Suspect patient likely at least partially fluid overloaded recent missed dialysis. --monitor Na   Anemia chronic kidney disease   Alzheimer's disease -Delirium precautions -Continue donepezil   Insulin-dependent diabetes mellitus type 2, uncontrolled With Hyperglycemia --A1c 8.1. --Home medication regimen includes Januvia 50 mg daily, NovoLog 10 units subcu nightly, and Levemir 20 units nightly. --hypoglycemic on 8/30 due to NPO Plan: --reduce Levemir to 12u nightly --SSI mod scale --add mealtime 3u TID   Hypothyroidism -Continue levothyroxine   Mixed hyperlipidemia -Continue statin   History of CVA -Continue statin and aspirin   GERD -Continue Protonix   DVT prophylaxis: Heparin SQ Code Status: Full code  Family Communication: wife and daughter updated today Level of care: Med-Surg Dispo:   The patient is from: home Anticipated d/c is to: SNF rehab Anticipated d/c date is: whenever  bed available   Subjective and Interval History:  Hiccups improved.  Pt was more alert and eating better now.   Objective: Vitals:   01/24/22 2113 01/25/22 0613 01/25/22 0823 01/25/22 1654  BP: (!) 157/59 (!) 173/56 (!) 154/69 (!) 184/64  Pulse: (!) 58 66 79 (!) 59  Resp: '18 18 16 15  '$ Temp: 98.2 F (36.8 C) 98.1 F (36.7  C) 98.5 F (36.9 C) 98.3 F (36.8 C)  TempSrc:      SpO2: 100% 98% 98% 99%  Weight:      Height:        Intake/Output Summary (Last 24 hours) at 01/25/2022 1905 Last data filed at 01/25/2022 1225 Gross per 24 hour  Intake 518.08 ml  Output --  Net 518.08 ml   Filed Weights   01/21/22 1817 01/23/22 1535 01/23/22 1918  Weight: 69.5 kg 71.3 kg 70.4 kg    Examination:   Constitutional: NAD, alert, oriented, sitting in recliner HEENT: conjunctivae and lids normal, EOMI CV: No cyanosis.   RESP: normal respiratory effort, on RA Neuro: II - XII grossly intact.     Data Reviewed: I have personally reviewed labs and imaging studies  Time spent: 35 minutes  Enzo Bi, MD [facility] If 7PM-7AM, please contact night-coverage 01/25/2022, 7:05 PM

## 2022-01-25 NOTE — TOC Progression Note (Addendum)
Transition of Care [facility]) - Progression Note    Patient Details  Name: Inez Rosato MRN: [id number] Date of Birth: [birth year]  Transition of Care [facility]) CM/SW Contact  Izola Price, RN Phone Number: 01/25/2022, 3:41 PM  Clinical Narrative:  9/3: Spoke with Daughter [name] as Mickle Plumb is out of town so [name] is here with her father and contact for next while. Bed offers so far given that are mostly in [place]. Patient receives HD at [facility] for past three years and is followed by Dr. Graylon Gunning, who can also speak in patient's native language per daughter. Directed to Medicare.gov list and [place] are searches are still pending and TOC will reach out the those when [facility] available on Monday. Simmie Davies RN CM   Dina called back after discussing with brother and chose [facility] as first preference, which is still pending. Simmie Davies RN CM     Expected Discharge Plan: Skilled Nursing Facility Barriers to Discharge: Ship broker, Continued Medical Work up, SNF Pending bed offer  Expected Discharge Plan and Services Expected Discharge Plan: [place] Choice: [place] arrangements for the past 2 months: Single Family Home                                       Social Determinants of Health (SDOH) Interventions    Readmission Risk Interventions     No data to display

## 2022-01-25 NOTE — Progress Notes (Signed)
[facility]  ROUNDING NOTE   Subjective:   Patient seen sitting up in bed, family member at bedside Family member reports that his hiccups continue to effect him the most after he eats.  Patient is alert and maintaining conversation He was able to walk to the door yesterday and reports he is eating well.   Objective:  Vital signs in last 24 hours:  Temp:  [98.1 F (36.7 C)-98.6 F (37 C)] 98.5 F (36.9 C) (09/03 0823) Pulse Rate:  [58-79] 79 (09/03 0823) Resp:  [16-18] 16 (09/03 0823) BP: (121-173)/(52-69) 154/69 (09/03 0823) SpO2:  [98 %-100 %] 98 % (09/03 0823)  Weight change:  Filed Weights   01/21/22 1817 01/23/22 1535 01/23/22 1918  Weight: 69.5 kg 71.3 kg 70.4 kg    Intake/Output: I/O last 3 completed shifts: In: 378 [P.O.:240; I.V.:3; Other:10; IV Piggyback:125] Out: 3536 [Drains:150; Other:1500]   Intake/Output this shift:  Total I/O In: 357 [P.O.:357] Out: -   Physical Exam: General: NAD, sitting up in bed, hiccups   Head: Normocephalic, atraumatic. Moist oral mucosal membranes  Eyes: Anicteric  Lungs:  Clear to auscultation, normal effort, room air  Heart: Regular rate and rhythm  Abdomen:  Soft, nontender  Extremities: No peripheral edema.  Neurologic: Nonfocal, moving all four extremities  Skin: No lesions  Access: Right chest PermCath    Basic Metabolic Panel: Recent Labs  Lab 01/19/22 0640 01/20/22 0438 01/21/22 0458 01/22/22 0427 01/23/22 0551 01/24/22 0501 01/25/22 0320  NA 134* 133* 125* 129* 130* 133* 127*  K 4.3 3.4* 4.3 4.0 4.2 4.0 4.1  CL 93* 95* 89* 90* 93* 95* 93*  CO2 '27 26 23 26 26 27 23  '$ GLUCOSE 214* 104* 107* 229* 209* 168* 108*  BUN 46* 30* 44* 23 34* 23 31*  CREATININE 9.09* 5.85* 7.39* 5.05* 7.15* 4.72* 6.19*  CALCIUM 8.8* 8.6* 8.5* 8.1* 8.5* 8.2* 8.3*  MG  --   --   --  1.8 2.0 1.8 1.9  PHOS 4.3 1.9* 2.3*  --   --   --   --      Liver Function Tests: Recent Labs  Lab 01/18/22 1151 01/19/22 0640  01/20/22 0438 01/21/22 0458  AST 11*  --  30  --   ALT 13  --  21  --   ALKPHOS 106  --  92  --   BILITOT 1.5*  --  0.6  --   PROT 8.2*  --  7.1  --   ALBUMIN 3.5 3.1* 2.8*  3.0* 2.5*    No results for input(s): "LIPASE", "AMYLASE" in the last 168 hours. No results for input(s): "AMMONIA" in the last 168 hours.  CBC: Recent Labs  Lab 01/18/22 1151 01/19/22 0640 01/20/22 0438 01/22/22 0427 01/23/22 0551 01/24/22 0501 01/25/22 0320  WBC 34.6*   < > 17.7* 13.2* 19.5* 20.0* 20.9*  NEUTROABS 24.9*  --   --   --   --   --   --   HGB 12.1*   < > 11.0* 10.6* 9.8* 9.4* 9.8*  HCT 38.0*   < > 33.7* 33.1* 31.2* 29.6* 31.1*  MCV 86.8   < > 86.0 86.2 86.2 85.5 86.4  PLT 240   < > 218 272 338 335 399   < > = values in this interval not displayed.     Cardiac Enzymes: No results for input(s): "CKTOTAL", "CKMB", "CKMBINDEX", "TROPONINI" in the last 168 hours.  BNP: Invalid input(s): "POCBNP"  CBG: Recent  Labs  Lab 01/24/22 0758 01/24/22 1141 01/24/22 1617 01/24/22 2112 01/25/22 0819  GLUCAP 168* 172* 245* 208* 70     Microbiology: Results for orders placed or performed during the hospital encounter of 01/18/22  Blood Culture (routine x 2)     Status: None   Collection Time: 01/18/22 11:51 AM   Specimen: BLOOD  Result Value Ref Range Status   Specimen Description BLOOD LEFT AC  Final   Special Requests   Final    BOTTLES DRAWN AEROBIC AND ANAEROBIC Blood Culture results may not be optimal due to an excessive volume of blood received in culture bottles   Culture   Final    NO GROWTH 5 DAYS Performed at [facility], [address] [place], [place] [address]    Report Status 01/23/2022 FINAL  Final  Resp Panel by RT-PCR (Flu A&B, Covid) Anterior Nasal Swab     Status: None   Collection Time: 01/18/22 11:51 AM   Specimen: Anterior Nasal Swab  Result Value Ref Range Status   SARS Coronavirus 2 by RT PCR NEGATIVE NEGATIVE Final    Comment:  (NOTE) SARS-CoV-2 target nucleic acids are NOT DETECTED.  The SARS-CoV-2 RNA is generally detectable in upper respiratory specimens during the acute phase of infection. The lowest concentration of SARS-CoV-2 viral copies this assay can detect is 138 copies/mL. A negative result does not preclude SARS-Cov-2 infection and should not be used as the sole basis for treatment or other patient management decisions. A negative result may occur with  improper specimen collection/handling, submission of specimen other than nasopharyngeal swab, presence of viral mutation(s) within the areas targeted by this assay, and inadequate number of viral copies(<138 copies/mL). A negative result must be combined with clinical observations, patient history, and epidemiological information. The expected result is Negative.  Fact Sheet for Patients:  EntrepreneurPulse.com.au  Fact Sheet for Healthcare Providers:  IncredibleEmployment.be  This test is no t yet approved or cleared by the [place] FDA and  has been authorized for detection and/or diagnosis of SARS-CoV-2 by FDA under an Emergency Use Authorization (EUA). This EUA will remain  in effect (meaning this test can be used) for the duration of the COVID-19 declaration under Section 564(b)(1) of the Act, 21 U.S.C.section 360bbb-3(b)(1), unless the authorization is terminated  or revoked sooner.       Influenza A by PCR NEGATIVE NEGATIVE Final   Influenza B by PCR NEGATIVE NEGATIVE Final    Comment: (NOTE) The Xpert Xpress SARS-CoV-2/FLU/RSV plus assay is intended as an aid in the diagnosis of influenza from Nasopharyngeal swab specimens and should not be used as a sole basis for treatment. Nasal washings and aspirates are unacceptable for Xpert Xpress SARS-CoV-2/FLU/RSV testing.  Fact Sheet for Patients: EntrepreneurPulse.com.au  Fact Sheet for Healthcare  Providers: IncredibleEmployment.be  This test is not yet approved or cleared by the [place] FDA and has been authorized for detection and/or diagnosis of SARS-CoV-2 by FDA under an Emergency Use Authorization (EUA). This EUA will remain in effect (meaning this test can be used) for the duration of the COVID-19 declaration under Section 564(b)(1) of the Act, 21 U.S.C. section 360bbb-3(b)(1), unless the authorization is terminated or revoked.  Performed at [facility], [address] [place],  [address]   Blood Culture (routine x 2)     Status: None (Preliminary result)   Collection Time: 01/18/22  2:30 PM   Specimen: BLOOD  Result Value Ref Range Status   Specimen Description BLOOD  RIGHT ARM  Final   Special Requests   Final    BOTTLES DRAWN AEROBIC AND ANAEROBIC Blood Culture adequate volume   Culture  Setup Time PENDING  Incomplete   Culture   Final    NO GROWTH 5 DAYS Performed at [facility], [address] [place], [place] [address]    Report Status PENDING  Incomplete  Aerobic/Anaerobic Culture w Gram Stain (surgical/deep wound)     Status: None (Preliminary result)   Collection Time: 01/21/22  3:30 PM   Specimen: Abscess  Result Value Ref Range Status   Specimen Description   Final    ABSCESS GALL BLADDER Performed at [facility], [address] [place], [place] [address]    Special Requests   Final    NONE Performed at [facility], [address] [place], [place] [address]    Gram Stain   Final    FEW WBC PRESENT,BOTH PMN AND MONONUCLEAR RARE GRAM POSITIVE COCCI IN PAIRS Performed at [facility], [place] [address], [place],  [address]    Culture   Final    FEW GRAM NEGATIVE RODS MODERATE STREPTOCOCCUS GALLOLYTICUS IDENTIFICATION AND SUSCEPTIBILITIES TO FOLLOW NO ANAEROBES ISOLATED; CULTURE IN PROGRESS FOR 5 DAYS    Report Status PENDING  Incomplete    Coagulation  Studies: No results for input(s): "LABPROT", "INR" in the last 72 hours.   Urinalysis: No results for input(s): "COLORURINE", "LABSPEC", "PHURINE", "GLUCOSEU", "HGBUR", "BILIRUBINUR", "KETONESUR", "PROTEINUR", "UROBILINOGEN", "NITRITE", "LEUKOCYTESUR" in the last 72 hours.  Invalid input(s): "APPERANCEUR"    Imaging: CT ABDOMEN PELVIS W CONTRAST  Result Date: 01/24/2022 CLINICAL DATA:  Persistent leukocytosis, cholecystitis status post percutaneous cholecystostomy, concern for abscess formation EXAM: CT ABDOMEN AND PELVIS WITH CONTRAST TECHNIQUE: Multidetector CT imaging of the abdomen and pelvis was performed using the standard protocol following bolus administration of intravenous contrast. RADIATION DOSE REDUCTION: This exam was performed according to the departmental dose-optimization program which includes automated exposure control, adjustment of the mA and/or kV according to patient size and/or use of iterative reconstruction technique. CONTRAST:  152m OMNIPAQUE IOHEXOL 300 MG/ML SOLN additional oral enteric contrast COMPARISON:  01/20/2022 FINDINGS: Lower chest: No acute abnormality.  Coronary artery calcifications. Hepatobiliary: No solid liver abnormality is seen. Status post percutaneous cholecystostomy tube placement, formed pigtail in the gallbladder fundus. The gallbladder is decompressed with extensive wall thickening and pericholecystic fat stranding, however no discrete fluid collection is identified (series 5, image 38). Pancreas: Unremarkable. No pancreatic ductal dilatation or surrounding inflammatory changes. Spleen: Normal in size without significant abnormality. Adrenals/Urinary Tract: Adrenal glands are unremarkable. Mildly atrophic kidneys with multiple bilateral simple, benign renal cortical cysts, for which no further follow-up or characterization is required. Bladder is unremarkable. Stomach/Bowel: Stomach is within normal limits. Appendix appears normal. No evidence of bowel  wall thickening, distention, or inflammatory changes. Vascular/Lymphatic: Aortic atherosclerosis. Extensive vascular calcinosis throughout the abdomen and pelvis. No enlarged abdominal or pelvic lymph nodes. Reproductive: No mass or other significant abnormality. Other: No abdominal wall hernia or abnormality. Anasarca. Trace perihepatic ascites. Musculoskeletal: No acute or significant osseous findings. IMPRESSION: 1. Status post percutaneous cholecystostomy tube placement, formed pigtail in the gallbladder fundus. The gallbladder is decompressed with extensive wall thickening and pericholecystic fat stranding, however no discrete fluid collection is identified. 2.  Trace perihepatic ascites.  Anasarca. 3.  Coronary artery disease. Aortic Atherosclerosis (ICD10-I70.0). Electronically Signed   By: ADelanna AhmadiM.D.   On: 01/24/2022 17:13     Medications:  cefTRIAXone (ROCEPHIN)  IV 2 g (01/24/22 2345)   chlorproMAZINE (THORAZINE) 12.5 mg in sodium chloride 0.9 % 25 mL IVPB 12.5 mg (01/24/22 1954)   methocarbamol (ROBAXIN) IV     metronidazole 500 mg (01/25/22 0848)    aspirin EC  81 mg Oral Daily   Chlorhexidine Gluconate Cloth  6 each Topical Q0600   donepezil  10 mg Oral Daily   feeding supplement (NEPRO CARB STEADY)  237 mL Oral TID BM   gabapentin  300 mg Oral QHS   heparin  5,000 Units Subcutaneous Q8H   hydrALAZINE  50 mg Oral Q8H   insulin aspart  0-15 Units Subcutaneous TID WC   insulin detemir  12 Units Subcutaneous QHS   levothyroxine  137 mcg Oral Q0600   loratadine  10 mg Oral QPM   losartan  50 mg Oral QPM   metoprolol succinate  50 mg Oral Daily   pantoprazole  40 mg Oral Daily   sevelamer carbonate  2.4 g Oral Q breakfast   simvastatin  20 mg Oral q1800   sodium chloride flush  3 mL Intravenous Q12H   acetaminophen **OR** acetaminophen, albuterol, alum & mag hydroxide-simeth, chlorproMAZINE (THORAZINE) 12.5 mg in sodium chloride 0.9 % 25 mL IVPB, methocarbamol (ROBAXIN)  IV  Assessment/ Plan:  [name] is a 76 y.o.  male with a past medical history of hypertension, end-stage renal disease, on hemodialysis on Monday Wednesday Friday schedule, diabetes mellitus, CVA who was brought to the ER with chief complaint of weakness.  [facility]/MWF/right chest PermCath  End-stage renal disease on hemodialysis.    Dialysis not indicated today. Next treatment scheduled for Monday. Orders prepared    2. Anemia of chronic kidney disease  Lab Results  Component Value Date   HGB 9.8 (L) 01/25/2022    Hemoglobin remains within acceptable target.  No need for ESA's.  3. Secondary Hyperparathyroidism: with outpatient labs: PTH 422, phosphorus 6.4, calcium 9.1 on 12/29/2021.   Lab Results  Component Value Date   CALCIUM 8.3 (L) 01/25/2022   CAION 0.83 (LL) 12/10/2020   PHOS 2.3 (L) 01/21/2022    Calcium stable today, 8.3.  We will continue to monitor.  4.  Hypertension with chronic kidney disease.  Home regimen includes hydralazine and losartan.  Currently receiving these medications along with metoprolol.  Blood pressure 154/69  5. Diabetes mellitus type II with chronic kidney disease insulin dependent. Home regimen includes NovoLog and Levemir. Most recent hemoglobin A1c is 8.1 on 01/18/2022.  Currently prescribed Januvia as well.   Glucose remains elevated, primary team to manage sliding scale insulin.  6.  Acute cholecystitis seen on CT.  Surgery consulted.  IR placed percutaneous drain on 01/21/22 and stabilizing with medications. Will plan for surgery at later date.   LOS: [place] 9/3/20239:58 AM

## 2022-01-25 NOTE — TOC Progression Note (Signed)
Transition of Care [facility]) - Progression Note    Patient Details  Name: Marqui Formby MRN: [id number] Date of Birth: 1945-11-25  Transition [facility]) CM/SW Contact  Izola Price, RN Phone Number: 01/25/2022, 2:42 PM  Clinical Narrative:  9/3: Daughter indicated she did not receive VM left yesterday but this RNCM to return call regarding SNF bed offers/options. Just left another VM with call back number and identification as RN CM for [facility] and please return call for discharge planning. 245 pm. Simmie Davies RN CM   Josue Hector [phone] Daughter contact.   Expected Discharge Plan: Skilled Nursing Facility Barriers to Discharge: Ship broker, Continued Medical Work up, SNF Pending bed offer  Expected Discharge Plan and Services Expected Discharge Plan: [place] Choice: [place] arrangements for the past 2 months: Single Family Home                                       Social Determinants of Health (SDOH) Interventions    Readmission Risk Interventions     No data to display

## 2022-01-26 DIAGNOSIS — K810 Acute cholecystitis: Secondary | ICD-10-CM | POA: Diagnosis not present

## 2022-01-26 LAB — BASIC METABOLIC PANEL
Anion gap: 10 (ref 5–15)
BUN: 41 mg/dL — ABNORMAL HIGH (ref 8–23)
CO2: 25 mmol/L (ref 22–32)
Calcium: 8.5 mg/dL — ABNORMAL LOW (ref 8.9–10.3)
Chloride: 92 mmol/L — ABNORMAL LOW (ref 98–111)
Creatinine, Ser: 7.68 mg/dL — ABNORMAL HIGH (ref 0.61–1.24)
GFR, Estimated: 7 mL/min — ABNORMAL LOW (ref >60)
Glucose, Bld: 130 mg/dL — ABNORMAL HIGH (ref 70–99)
Potassium: 4.4 mmol/L (ref 3.5–5.1)
Sodium: 127 mmol/L — ABNORMAL LOW (ref 135–145)

## 2022-01-26 LAB — CBC
HCT: 30.2 % — ABNORMAL LOW (ref 39.0–52.0)
Hemoglobin: 9.5 g/dL — ABNORMAL LOW (ref 13.0–17.0)
MCH: 27.3 pg (ref 26.0–34.0)
MCHC: 31.5 g/dL (ref 30.0–36.0)
MCV: 86.8 fL (ref 80.0–100.0)
Platelets: 434 10*3/uL — ABNORMAL HIGH (ref 150–400)
RBC: 3.48 MIL/uL — ABNORMAL LOW (ref 4.22–5.81)
RDW: 17.2 % — ABNORMAL HIGH (ref 11.5–15.5)
WBC: 17 10*3/uL — ABNORMAL HIGH (ref 4.0–10.5)
nRBC: 0 % (ref 0.0–0.2)

## 2022-01-26 LAB — GLUCOSE, CAPILLARY
Glucose-Capillary: 92 mg/dL (ref 70–99)
Glucose-Capillary: 227 mg/dL — ABNORMAL HIGH (ref 70–99)
Glucose-Capillary: 185 mg/dL — ABNORMAL HIGH (ref 70–99)

## 2022-01-26 LAB — MAGNESIUM: Magnesium: 2 mg/dL (ref 1.7–2.4)

## 2022-01-26 LAB — PROCALCITONIN: Procalcitonin: 2.72 ng/mL

## 2022-01-26 MED ORDER — LOSARTAN POTASSIUM 50 MG PO TABS
100.0000 mg | ORAL_TABLET | Freq: Every evening | ORAL | Status: DC
  Administered 2022-01-26 – 2022-02-01 (×7): 100 mg via ORAL
  Filled 2022-01-26 (×7): qty 2

## 2022-01-26 MED ORDER — AMLODIPINE BESYLATE 10 MG PO TABS
10.0000 mg | ORAL_TABLET | Freq: Every day | ORAL | Status: DC
  Administered 2022-01-26 – 2022-02-02 (×7): 10 mg via ORAL
  Filled 2022-01-26 (×7): qty 1

## 2022-01-26 NOTE — Progress Notes (Signed)
PROGRESS NOTE    Joseph Mcpherson  CHE:[id number] DOB: 29-Oct-1945 DOA: 01/18/2022 PCP: [name], MD  127A/127A-AA  LOS: 8 days   Brief hospital course: No notes on file  Assessment & Plan: Joseph Mcpherson is a 76 y.o. 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  History is mostly obtained from the patient's wife as he is lethargic and difficult to arouse.  Apparently he had been having fevers up to 101 F for which she had gone to dialysis 2 days ago, but was not dialyzed and returned home. His wife notes that he had slept all day yesterday and was so weak he was unable to stand or ambulate without assistance.   He was noted to have some urinary incontinence prior to arrival.     Acute cholecystitis S/p chole tube on 8/30 --dx by CT c/a/p.   --GenSurg consulted, rec chole tube --started on vanc/cefepime on presentation, then switched to ceftriaxone and flagyl on 8/30 --CT a/p w contrast 9/2 showed no abscess formation or other acute finding other than cholecystitis Plan: --cont ceftriaxone and flagyl while inpatient --chole tube care and flushes --outpatient surgical f/u in 3-4 weeks --Percutaneous cholecystostomy drain to remain in place at least 6 weeks. Recommend fluoroscopy with injection of the drain in IR to evaluate for patency of the cystic duct.  Leukocytosis  --CT a/p w contrast 9/2 showed no abscess formation or other acute finding other than cholecystitis --WBC trended down from 20 to 17 this morning --trend  Sepsis --On admission patient noted to be febrile up to 101.4 F with mild tachypnea, and labs significant for WBC 34.6 with lactic acid reassuring at 1.  Source acute cholecystitis  Acute metabolic encephalopathy, improved --somnolent, altered, due to infection.  Started to become more alert on 9/1.   Hyperkalemia due to missing HD --  Initial  potassium 6.7 with signs of T wave peaking noted on EKG.   --Patient has been given Lokelma 10g p.o., albuterol 10 mg neb, calcium gluconate 1 g IV, amp of D50, and 10 units of insulin. --monitor   ESRD on HD MWF Patient normally dialyzes Monday, Wednesday, Friday, but missed dialysis Friday due to not feeling well.   -HD per nephrology   Weakness Patient noted to be grossly weak needing assistance to ambulate. - CT scan of the brain negative   --PT/OT rec SNF rehab  Poor oral intake --started PTA, but wife said pt eats little at baseline.  Wife declined temp tube feeds. --Nepro TID   Essential hypertension --BP has been elevated on home BP meds --cont Toprol --cont hydralazine to 100 mg q8h --increase losartan to 100 mg q evening   Hyponatremia, acute --Suspect patient likely at least partially fluid overloaded recent missed dialysis. --monitor Na   Anemia chronic kidney disease   Alzheimer's disease -Delirium precautions -Continue donepezil   Insulin-dependent diabetes mellitus type 2, uncontrolled With Hyperglycemia --A1c 8.1. --Home medication regimen includes Januvia 50 mg daily, NovoLog 10 units subcu nightly, and Levemir 20 units nightly. --hypoglycemic on 8/30 due to NPO Plan: --cont Levemir 12u nightly --SSI mod scale --cont mealtime 3u TID   Hypothyroidism -Continue levothyroxine   Mixed hyperlipidemia -Continue statin   History of CVA -Continue statin and aspirin   GERD -Continue Protonix   DVT prophylaxis: Heparin SQ Code Status: Full code  Family Communication:  Level of care: Med-Surg Dispo:  The patient is from: home Anticipated d/c is to: SNF rehab Anticipated d/c date is: whenever bed available   Subjective and Interval History:  Mental status much improved.  WBC started to trend down.   Objective: Vitals:   01/26/22 1147 01/26/22 1151 01/26/22 1229 01/26/22 1530  BP:  (!) 163/74 (!) 173/68 (!) 179/60  Pulse:   71 74  Resp:    20 16  Temp: 98.4 F (36.9 C)  98.5 F (36.9 C) 98.2 F (36.8 C)  TempSrc: Oral     SpO2:   98% 100%  Weight:  69.4 kg    Height:        Intake/Output Summary (Last 24 hours) at 01/26/2022 1604 Last data filed at 01/26/2022 1545 Gross per 24 hour  Intake 347 ml  Output 2130 ml  Net -1783 ml   Filed Weights   01/23/22 1918 01/26/22 0800 01/26/22 1151  Weight: 70.4 kg 70.9 kg 69.4 kg    Examination:   Constitutional: NAD, AAOx3, seen in dialysis HEENT: conjunctivae and lids normal, EOMI CV: No cyanosis.   RESP: normal respiratory effort, on RA Neuro: II - XII grossly intact.   Psych: Normal mood and affect.     Data Reviewed: I have personally reviewed labs and imaging studies  Time spent: 35 minutes  Enzo Bi, MD [facility] If 7PM-7AM, please contact night-coverage 01/26/2022, 4:04 PM

## 2022-01-26 NOTE — Progress Notes (Signed)
Pre HD RN assessment 

## 2022-01-26 NOTE — Progress Notes (Signed)
Post HD RN assessment 

## 2022-01-26 NOTE — TOC Progression Note (Signed)
Transition of Care [facility]) - Progression Note    Patient Details  Name: Joseph Mcpherson MRN: [id number] Date of Birth: [birth year]  Transition of Care [facility]) CM/SW Contact  Laurena Slimmer, RN Phone Number: 01/26/2022, 1:15 PM  Clinical Narrative:    Spoke with patient's daughter Joseph Mcpherson regarding discharge plan. Advised bed offers were given for [facility] facilities. Daughter request information to be sent to [facility]. Daughter plans to tour facility.    Expected Discharge Plan: Skilled Nursing Facility Barriers to Discharge: Ship broker, Continued Medical Work up, SNF Pending bed offer  Expected Discharge Plan and Services Expected Discharge Plan: [place] Choice: [place] arrangements for the past 2 months: Single Family Home                                       Social Determinants of Health (SDOH) Interventions    Readmission Risk Interventions     No data to display

## 2022-01-26 NOTE — Care Management Important Message (Signed)
Important Message  Patient Details  Name: Joseph Mcpherson MRN: [id number] Date of Birth: [birth year]   Medicare Important Message Given:  Yes     [name] 01/26/2022, 11:38 AM

## 2022-01-26 NOTE — Progress Notes (Signed)
Pt 3.5 hour HD treatment complete. Pt alert, no c/o, vss. Report to primary RN.  Start: 4171 End: 1147 2083m fluid removed 84L BVP 69.4Kg post bed weight No meds w/ HD

## 2022-01-26 NOTE — Progress Notes (Signed)
[facility]  ROUNDING NOTE   Subjective:   Patient seen and evaluated during hemodialysis treatment. Tolerating well.   HEMODIALYSIS FLOWSHEET:  Blood Flow Rate (mL/min): 400 mL/min Arterial Pressure (mmHg): -210 mmHg Venous Pressure (mmHg): 210 mmHg TMP (mmHg): 4 mmHg Ultrafiltration Rate (mL/min): 754 mL/min Dialysate Flow Rate (mL/min): 300 ml/min Dialysis Fluid Bolus: Normal Saline Bolus Amount (mL): 300 mL   Objective:  Vital signs in last 24 hours:  Temp:  [98 F (36.7 C)-98.7 F (37.1 C)] 98.4 F (36.9 C) (09/04 1147) Pulse Rate:  [54-62] 57 (09/04 0945) Resp:  [12-21] 16 (09/04 0945) BP: (132-190)/(53-74) 163/74 (09/04 1151) SpO2:  [99 %-100 %] 100 % (09/04 0945) Weight:  [69.4 kg-70.9 kg] 69.4 kg (09/04 1151)  Weight change:  Filed Weights   01/23/22 1918 01/26/22 0800 01/26/22 1151  Weight: 70.4 kg 70.9 kg 69.4 kg    Intake/Output: I/O last 3 completed shifts: In: 523.1 [P.O.:357; Other:10; IV Piggyback:156.1] Out: 130 [Drains:130]   Intake/Output this shift:  Total I/O In: -  Out: 2000 [Other:2000]  Physical Exam: General: NAD  Head: Normocephalic, atraumatic. Moist oral mucosal membranes  Eyes: Anicteric  Lungs:  Clear to auscultation, normal effort, room air  Heart: Regular rate and rhythm  Abdomen:  Soft, nontender  Extremities: No peripheral edema.  Neurologic: Nonfocal, moving all four extremities  Skin: No lesions  Access: Right chest PermCath    Basic Metabolic Panel: Recent Labs  Lab 01/20/22 0438 01/21/22 0458 01/22/22 0427 01/23/22 0551 01/24/22 0501 01/25/22 0320 01/26/22 0552  NA 133* 125* 129* 130* 133* 127* 127*  K 3.4* 4.3 4.0 4.2 4.0 4.1 4.4  CL 95* 89* 90* 93* 95* 93* 92*  CO2 '26 23 26 26 27 23 25  '$ GLUCOSE 104* 107* 229* 209* 168* 108* 130*  BUN 30* 44* 23 34* 23 31* 41*  CREATININE 5.85* 7.39* 5.05* 7.15* 4.72* 6.19* 7.68*  CALCIUM 8.6* 8.5* 8.1* 8.5* 8.2* 8.3* 8.5*  MG  --   --  1.8 2.0 1.8 1.9  2.0  PHOS 1.9* 2.3*  --   --   --   --   --      Liver Function Tests: Recent Labs  Lab 01/20/22 0438 01/21/22 0458  AST 30  --   ALT 21  --   ALKPHOS 92  --   BILITOT 0.6  --   PROT 7.1  --   ALBUMIN 2.8*  3.0* 2.5*    No results for input(s): "LIPASE", "AMYLASE" in the last 168 hours. No results for input(s): "AMMONIA" in the last 168 hours.  CBC: Recent Labs  Lab 01/22/22 0427 01/23/22 0551 01/24/22 0501 01/25/22 0320 01/26/22 0552  WBC 13.2* 19.5* 20.0* 20.9* 17.0*  HGB 10.6* 9.8* 9.4* 9.8* 9.5*  HCT 33.1* 31.2* 29.6* 31.1* 30.2*  MCV 86.2 86.2 85.5 86.4 86.8  PLT 272 338 335 399 434*     Cardiac Enzymes: No results for input(s): "CKTOTAL", "CKMB", "CKMBINDEX", "TROPONINI" in the last 168 hours.  BNP: Invalid input(s): "POCBNP"  CBG: Recent Labs  Lab 01/24/22 2112 01/25/22 0819 01/25/22 1154 01/25/22 1645 01/25/22 2028  GLUCAP 208* 70 195* 248* 59*     Microbiology: Results for orders placed or performed during the hospital encounter of 01/18/22  Blood Culture (routine x 2)     Status: None   Collection Time: 01/18/22 11:51 AM   Specimen: BLOOD  Result Value Ref Range Status   Specimen Description BLOOD LEFT [facility]  Final   Special  Requests   Final    BOTTLES DRAWN AEROBIC AND ANAEROBIC Blood Culture results may not be optimal due to an excessive volume of blood received in culture bottles   Culture   Final    NO GROWTH 5 DAYS Performed at [facility], [address] [place], [place] [address]    Report Status 01/23/2022 FINAL  Final  Resp Panel by RT-PCR (Flu A&B, Covid) Anterior Nasal Swab     Status: None   Collection Time: 01/18/22 11:51 AM   Specimen: Anterior Nasal Swab  Result Value Ref Range Status   SARS Coronavirus 2 by RT PCR NEGATIVE NEGATIVE Final    Comment: (NOTE) SARS-CoV-2 target nucleic acids are NOT DETECTED.  The SARS-CoV-2 RNA is generally detectable in upper respiratory specimens during the acute phase  of infection. The lowest concentration of SARS-CoV-2 viral copies this assay can detect is 138 copies/mL. A negative result does not preclude SARS-Cov-2 infection and should not be used as the sole basis for treatment or other patient management decisions. A negative result may occur with  improper specimen collection/handling, submission of specimen other than nasopharyngeal swab, presence of viral mutation(s) within the areas targeted by this assay, and inadequate number of viral copies(<138 copies/mL). A negative result must be combined with clinical observations, patient history, and epidemiological information. The expected result is Negative.  Fact Sheet for Patients:  EntrepreneurPulse.com.au  Fact Sheet for Healthcare Providers:  IncredibleEmployment.be  This test is no t yet approved or cleared by the [place] FDA and  has been authorized for detection and/or diagnosis of SARS-CoV-2 by FDA under an Emergency Use Authorization (EUA). This EUA will remain  in effect (meaning this test can be used) for the duration of the COVID-19 declaration under Section 564(b)(1) of the Act, 21 U.S.C.section 360bbb-3(b)(1), unless the authorization is terminated  or revoked sooner.       Influenza A by PCR NEGATIVE NEGATIVE Final   Influenza B by PCR NEGATIVE NEGATIVE Final    Comment: (NOTE) The Xpert Xpress SARS-CoV-2/FLU/RSV plus assay is intended as an aid in the diagnosis of influenza from Nasopharyngeal swab specimens and should not be used as a sole basis for treatment. Nasal washings and aspirates are unacceptable for Xpert Xpress SARS-CoV-2/FLU/RSV testing.  Fact Sheet for Patients: EntrepreneurPulse.com.au  Fact Sheet for Healthcare Providers: IncredibleEmployment.be  This test is not yet approved or cleared by the [place] FDA and has been authorized for detection and/or diagnosis of  SARS-CoV-2 by FDA under an Emergency Use Authorization (EUA). This EUA will remain in effect (meaning this test can be used) for the duration of the COVID-19 declaration under Section 564(b)(1) of the Act, 21 U.S.C. section 360bbb-3(b)(1), unless the authorization is terminated or revoked.  Performed at [facility], [address] [place], [place] [address]   Blood Culture (routine x 2)     Status: None (Preliminary result)   Collection Time: 01/18/22  2:30 PM   Specimen: BLOOD  Result Value Ref Range Status   Specimen Description BLOOD RIGHT ARM  Final   Special Requests   Final    BOTTLES DRAWN AEROBIC AND ANAEROBIC Blood Culture adequate volume   Culture  Setup Time PENDING  Incomplete   Culture   Final    NO GROWTH 5 DAYS Performed at [facility], [address], [place], [place] [address]    Report Status PENDING  Incomplete  Aerobic/Anaerobic Culture w Gram Stain (surgical/deep wound)     Status:  None (Preliminary result)   Collection Time: 01/21/22  3:30 PM   Specimen: Abscess  Result Value Ref Range Status   Specimen Description   Final    ABSCESS GALL BLADDER Performed at [facility], [address] [place], [place] [address]    Special Requests   Final    NONE Performed at [facility], [address] [place], [place] [address]    Gram Stain   Final    FEW WBC PRESENT,BOTH PMN AND MONONUCLEAR RARE GRAM POSITIVE COCCI IN PAIRS Performed at [facility], [place] [address], [place],  [address]    Culture   Final    FEW ESCHERICHIA COLI MODERATE STREPTOCOCCUS GALLOLYTICUS CULTURE REINCUBATED FOR BETTER GROWTH REISOLATING NO ANAEROBES ISOLATED; CULTURE IN PROGRESS FOR 5 DAYS    Report Status PENDING  Incomplete    Coagulation Studies: No results for input(s): "LABPROT", "INR" in the last 72 hours.   Urinalysis: No results for input(s): "COLORURINE", "LABSPEC", "PHURINE", "GLUCOSEU", "HGBUR",  "BILIRUBINUR", "KETONESUR", "PROTEINUR", "UROBILINOGEN", "NITRITE", "LEUKOCYTESUR" in the last 72 hours.  Invalid input(s): "APPERANCEUR"    Imaging: CT ABDOMEN PELVIS W CONTRAST  Result Date: 01/24/2022 CLINICAL DATA:  Persistent leukocytosis, cholecystitis status post percutaneous cholecystostomy, concern for abscess formation EXAM: CT ABDOMEN AND PELVIS WITH CONTRAST TECHNIQUE: Multidetector CT imaging of the abdomen and pelvis was performed using the standard protocol following bolus administration of intravenous contrast. RADIATION DOSE REDUCTION: This exam was performed according to the departmental dose-optimization program which includes automated exposure control, adjustment of the mA and/or kV according to patient size and/or use of iterative reconstruction technique. CONTRAST:  198m OMNIPAQUE IOHEXOL 300 MG/ML SOLN additional oral enteric contrast COMPARISON:  01/20/2022 FINDINGS: Lower chest: No acute abnormality.  Coronary artery calcifications. Hepatobiliary: No solid liver abnormality is seen. Status post percutaneous cholecystostomy tube placement, formed pigtail in the gallbladder fundus. The gallbladder is decompressed with extensive wall thickening and pericholecystic fat stranding, however no discrete fluid collection is identified (series 5, image 38). Pancreas: Unremarkable. No pancreatic ductal dilatation or surrounding inflammatory changes. Spleen: Normal in size without significant abnormality. Adrenals/Urinary Tract: Adrenal glands are unremarkable. Mildly atrophic kidneys with multiple bilateral simple, benign renal cortical cysts, for which no further follow-up or characterization is required. Bladder is unremarkable. Stomach/Bowel: Stomach is within normal limits. Appendix appears normal. No evidence of bowel wall thickening, distention, or inflammatory changes. Vascular/Lymphatic: Aortic atherosclerosis. Extensive vascular calcinosis throughout the abdomen and pelvis. No  enlarged abdominal or pelvic lymph nodes. Reproductive: No mass or other significant abnormality. Other: No abdominal wall hernia or abnormality. Anasarca. Trace perihepatic ascites. Musculoskeletal: No acute or significant osseous findings. IMPRESSION: 1. Status post percutaneous cholecystostomy tube placement, formed pigtail in the gallbladder fundus. The gallbladder is decompressed with extensive wall thickening and pericholecystic fat stranding, however no discrete fluid collection is identified. 2.  Trace perihepatic ascites.  Anasarca. 3.  Coronary artery disease. Aortic Atherosclerosis (ICD10-I70.0). Electronically Signed   By: ADelanna AhmadiM.D.   On: 01/24/2022 17:13     Medications:    cefTRIAXone (ROCEPHIN)  IV 2 g (01/25/22 2303)   chlorproMAZINE (THORAZINE) 12.5 mg in sodium chloride 0.9 % 25 mL IVPB Stopped (01/25/22 1225)   methocarbamol (ROBAXIN) IV     metronidazole 500 mg (01/25/22 2339)    amLODipine  10 mg Oral Daily   aspirin EC  81 mg Oral Daily   Chlorhexidine Gluconate Cloth  6 each Topical Q0600   donepezil  10 mg Oral Daily  feeding supplement (NEPRO CARB STEADY)  237 mL Oral TID BM   gabapentin  300 mg Oral QHS   heparin  5,000 Units Subcutaneous Q8H   hydrALAZINE  100 mg Oral Q8H   insulin aspart  0-15 Units Subcutaneous TID WC   insulin aspart  3 Units Subcutaneous TID WC   insulin detemir  12 Units Subcutaneous QHS   levothyroxine  137 mcg Oral Q0600   loratadine  10 mg Oral QPM   losartan  100 mg Oral QPM   metoprolol succinate  50 mg Oral Daily   pantoprazole  40 mg Oral Daily   sevelamer carbonate  2.4 g Oral Q breakfast   simvastatin  20 mg Oral q1800   sodium chloride flush  3 mL Intravenous Q12H   acetaminophen **OR** acetaminophen, albuterol, alum & mag hydroxide-simeth, chlorproMAZINE (THORAZINE) 12.5 mg in sodium chloride 0.9 % 25 mL IVPB, methocarbamol (ROBAXIN) IV  Assessment/ Plan:  Joseph Mcpherson is a 76 y.o.  male with a past medical  history of hypertension, end-stage renal disease, on hemodialysis on Monday Wednesday Friday schedule, diabetes mellitus, CVA who was brought to the ER with chief complaint of weakness.  [facility]/MWF/right chest PermCath  End-stage renal disease on hemodialysis.    Patient seen and evaluated during hemodialysis treatment today.  Tolerating well.  We plan to complete dialysis treatment today.  2. Anemia of chronic kidney disease  Lab Results  Component Value Date   HGB 9.5 (L) 01/26/2022    Hemoglobin remains within acceptable target.  Patient to receive Mircera as an outpatient.  3. Secondary Hyperparathyroidism: with outpatient labs: PTH 422, phosphorus 6.4, calcium 9.1 on 12/29/2021.   Lab Results  Component Value Date   CALCIUM 8.5 (L) 01/26/2022   CAION 0.83 (LL) 12/10/2020   PHOS 2.3 (L) 01/21/2022    Continue to periodically monitor bone mineral metabolism parameters.  4.  Hypertension with chronic kidney disease.  Home regimen includes hydralazine and losartan.  Currently receiving these medications along with metoprolol.  Blood pressure 154/69  5. Diabetes mellitus type II with chronic kidney disease insulin dependent. Home regimen includes NovoLog and Levemir. Most recent hemoglobin A1c is 8.1 on 01/18/2022.  Currently prescribed Januvia as well.   Management as per primary care team.  6.  Acute cholecystitis seen on CT.  Surgery consulted.  IR placed percutaneous drain on 01/21/22 and stabilizing with medications. Will plan for surgery at later date.   LOS: 8 Kindle Strohmeier 9/4/202312:11 PM

## 2022-01-27 DIAGNOSIS — A419 Sepsis, unspecified organism: Secondary | ICD-10-CM | POA: Diagnosis not present

## 2022-01-27 LAB — CBC
HCT: 31 % — ABNORMAL LOW (ref 39.0–52.0)
Hemoglobin: 9.6 g/dL — ABNORMAL LOW (ref 13.0–17.0)
MCH: 26.5 pg (ref 26.0–34.0)
MCHC: 31 g/dL (ref 30.0–36.0)
MCV: 85.6 fL (ref 80.0–100.0)
Platelets: 447 10*3/uL — ABNORMAL HIGH (ref 150–400)
RBC: 3.62 MIL/uL — ABNORMAL LOW (ref 4.22–5.81)
RDW: 17.2 % — ABNORMAL HIGH (ref 11.5–15.5)
WBC: 24.2 10*3/uL — ABNORMAL HIGH (ref 4.0–10.5)
nRBC: 0 % (ref 0.0–0.2)

## 2022-01-27 LAB — BASIC METABOLIC PANEL
Anion gap: 14 (ref 5–15)
BUN: 23 mg/dL (ref 8–23)
CO2: 25 mmol/L (ref 22–32)
Calcium: 9.1 mg/dL (ref 8.9–10.3)
Chloride: 93 mmol/L — ABNORMAL LOW (ref 98–111)
Creatinine, Ser: 5.46 mg/dL — ABNORMAL HIGH (ref 0.61–1.24)
GFR, Estimated: 10 mL/min — ABNORMAL LOW (ref >60)
Glucose, Bld: 101 mg/dL — ABNORMAL HIGH (ref 70–99)
Potassium: 4.3 mmol/L (ref 3.5–5.1)
Sodium: 132 mmol/L — ABNORMAL LOW (ref 135–145)

## 2022-01-27 LAB — GLUCOSE, CAPILLARY
Glucose-Capillary: 110 mg/dL — ABNORMAL HIGH (ref 70–99)
Glucose-Capillary: 135 mg/dL — ABNORMAL HIGH (ref 70–99)
Glucose-Capillary: 114 mg/dL — ABNORMAL HIGH (ref 70–99)
Glucose-Capillary: 181 mg/dL — ABNORMAL HIGH (ref 70–99)

## 2022-01-27 LAB — CULTURE, BLOOD (ROUTINE X 2)
Culture: NO GROWTH
Special Requests: ADEQUATE

## 2022-01-27 LAB — MAGNESIUM: Magnesium: 1.8 mg/dL (ref 1.7–2.4)

## 2022-01-27 MED ORDER — SODIUM CHLORIDE 0.9% FLUSH
5.0000 mL | Freq: Three times a day (TID) | INTRAVENOUS | Status: DC
  Administered 2022-01-27 – 2022-02-02 (×20): 5 mL

## 2022-01-27 NOTE — Progress Notes (Signed)
Mobility Specialist - Progress Note   01/27/22 1000  Mobility  Activity Transferred from chair to bed  Level of Assistance Moderate assist, patient does 50-74% (2 Mobility Specialist (Chair Follow intended))  Assistive Device Front wheel walker  Distance Ambulated (ft) 5 ft  Activity Response Tolerated well  $Mobility charge 1 Mobility   Pt sitting in recliner on RA upon arrival. Pt STS with MinA, unable to stand up straight without VC and ModA. Pt transfers to bed and has one LOB corrected by [place]. Pt needs VC for safety. Pt left in bed with needs in reach and family in room.  Gretchen Short  Mobility Specialist  01/27/22 10:09 AM

## 2022-01-27 NOTE — Progress Notes (Signed)
PROGRESS NOTE    [place] Mcpherson  JKK:[id number] DOB: Jul 27, 1945 DOA: 01/18/2022 PCP: [name], MD  127A/127A-AA  LOS: 9 days   Brief hospital course: No notes on file  Assessment & Plan: Joseph Mcpherson is a 76 y.o. 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     Acute cholecystitis S/p chole tube on 8/30 --dx by CT c/a/p.   --GenSurg consulted, rec chole tube --started on vanc/cefepime on presentation, then switched to ceftriaxone and flagyl on 8/30 --CT a/p w contrast 9/2 showed no abscess formation or other acute finding other than cholecystitis Plan: --cont ceftriaxone and flagyl while inpatient, can discharge on Augmentin, for a 14-day course --chole tube care and flushes --outpatient surgical f/u in 3-4 weeks --Percutaneous cholecystostomy drain to remain in place at least 6 weeks. Recommend fluoroscopy with injection of the drain in IR to evaluate for patency of the cystic duct.  Leukocytosis  --repeat CT a/p w contrast 9/2 showed no abscess formation or other acute finding other than cholecystitis  Sepsis --On admission patient noted to be febrile up to 101.4 F with mild tachypnea, and labs significant for WBC 34.6 with lactic acid reassuring at 1.  Source acute cholecystitis  Acute metabolic encephalopathy, improved --on presentation, somnolent, altered, due to infection.  Started to become more alert on 9/1.   Hyperkalemia due to missing HD --  Initial potassium 6.7 with signs of T wave peaking noted on EKG.   --Patient has been given Lokelma 10g p.o., albuterol 10 mg neb, calcium gluconate 1 g IV, amp of D50, and 10 units of insulin. --monitor   ESRD on HD MWF Patient normally dialyzes Monday, Wednesday, Friday, but missed dialysis Friday due to not feeling well.   -HD per nephrology   Weakness Patient noted to be grossly weak needing assistance to  ambulate. - CT scan of the brain negative   --PT/OT rec SNF rehab  Poor oral intake --started PTA, but wife said pt eats little at baseline.  Wife declined temp tube feeds. --Nepro TID   Essential hypertension --BP has been elevated on home BP meds --cont Toprol --cont hydralazine to 100 mg q8h --cont losartan to 100 mg q evening   Hyponatremia, acute --Suspect patient likely at least partially fluid overloaded recent missed dialysis. --monitor Na   Anemia chronic kidney disease   Alzheimer's disease -Delirium precautions -Continue donepezil   Insulin-dependent diabetes mellitus type 2, uncontrolled With Hyperglycemia --A1c 8.1. --Home medication regimen includes Januvia 50 mg daily, NovoLog 10 units subcu nightly, and Levemir 20 units nightly. --hypoglycemic on 8/30 due to NPO Plan: --cont Levemir 12u nightly --SSI mod scale --cont mealtime 3u TID   Hypothyroidism -Continue levothyroxine   Mixed hyperlipidemia -Continue statin   History of CVA -Continue statin and aspirin   GERD -Continue Protonix   DVT prophylaxis: Heparin SQ Code Status: Full code  Family Communication: wife updated at bedside today Level of care: Med-Surg Dispo:   The patient is from: home Anticipated d/c is to: SNF rehab Anticipated d/c date is: whenever bed available   Subjective and Interval History:  Still having intermittent hiccups.  Eating better now.     Objective: Vitals:   01/26/22 2250 01/27/22 0748 01/27/22 1300 01/27/22 1555  BP: (!) 155/63 (!) 158/54 (!) 144/53 (!) 137/49  Pulse: 84 74 61 64  Resp: '18 18  16  '$ Temp: 99.4 F (37.4 C)  99.1 F (37.3 C)  98.4 F (36.9 C)  TempSrc:      SpO2: 98% 99%  100%  Weight:      Height:        Intake/Output Summary (Last 24 hours) at 01/27/2022 2020 Last data filed at 01/27/2022 1911 Gross per 24 hour  Intake 1068.12 ml  Output 200 ml  Net 868.12 ml   Filed Weights   01/23/22 1918 01/26/22 0800 01/26/22 1151   Weight: 70.4 kg 70.9 kg 69.4 kg    Examination:   Constitutional: NAD, AAOx3 HEENT: conjunctivae and lids normal, EOMI CV: No cyanosis.   RESP: normal respiratory effort, on RA Ab: chole tube draining yellow green fluids Neuro: II - XII grossly intact.     Data Reviewed: I have personally reviewed labs and imaging studies  Time spent: 25 minutes  Enzo Bi, MD [facility] If 7PM-7AM, please contact night-coverage 01/27/2022, 8:20 PM

## 2022-01-27 NOTE — Progress Notes (Signed)
[facility]  ROUNDING NOTE   Subjective:   Patient seen sitting up in chair Wife at bedside Appetite remains poor, small bites of food Continues to have abdominal pain, improving slowly   Objective:  Vital signs in last 24 hours:  Temp:  [98.2 F (36.8 C)-99.4 F (37.4 C)] 99.1 F (37.3 C) (09/05 0748) Pulse Rate:  [61-84] 61 (09/05 1300) Resp:  [16-18] 18 (09/05 0748) BP: (144-179)/(53-63) 144/53 (09/05 1300) SpO2:  [98 %-100 %] 99 % (09/05 0748)  Weight change:  Filed Weights   01/23/22 1918 01/26/22 0800 01/26/22 1151  Weight: 70.4 kg 70.9 kg 69.4 kg    Intake/Output: I/O last 3 completed shifts: In: 404.3 [P.O.:237; Other:20; IV Piggyback:147.3] Out: 2260 [Drains:260; Other:2000]   Intake/Output this shift:  Total I/O In: 242 [P.O.:237; Other:5] Out: 20 [Drains:20]  Physical Exam: General: NAD  Head: Normocephalic, atraumatic. Moist oral mucosal membranes  Eyes: Anicteric  Lungs:  Clear to auscultation, normal effort, room air  Heart: Regular rate and rhythm  Abdomen:  Soft, tender  Extremities: No peripheral edema.  Neurologic: Nonfocal, moving all four extremities  Skin: No lesions  Access: Right chest PermCath    Basic Metabolic Panel: Recent Labs  Lab 01/21/22 0458 01/22/22 0427 01/23/22 0551 01/24/22 0501 01/25/22 0320 01/26/22 0552 01/27/22 0638  NA 125*   < > 130* 133* 127* 127* 132*  K 4.3   < > 4.2 4.0 4.1 4.4 4.3  CL 89*   < > 93* 95* 93* 92* 93*  CO2 23   < > '26 27 23 25 25  '$ GLUCOSE 107*   < > 209* 168* 108* 130* 101*  BUN 44*   < > 34* 23 31* 41* 23  CREATININE 7.39*   < > 7.15* 4.72* 6.19* 7.68* 5.46*  CALCIUM 8.5*   < > 8.5* 8.2* 8.3* 8.5* 9.1  MG  --    < > 2.0 1.8 1.9 2.0 1.8  PHOS 2.3*  --   --   --   --   --   --    < > = values in this interval not displayed.     Liver Function Tests: Recent Labs  Lab 01/21/22 0458  ALBUMIN 2.5*    No results for input(s): "LIPASE", "AMYLASE" in the last 168  hours. No results for input(s): "AMMONIA" in the last 168 hours.  CBC: Recent Labs  Lab 01/23/22 0551 01/24/22 0501 01/25/22 0320 01/26/22 0552 01/27/22 0638  WBC 19.5* 20.0* 20.9* 17.0* 24.2*  HGB 9.8* 9.4* 9.8* 9.5* 9.6*  HCT 31.2* 29.6* 31.1* 30.2* 31.0*  MCV 86.2 85.5 86.4 86.8 85.6  PLT 338 335 399 434* 447*     Cardiac Enzymes: No results for input(s): "CKTOTAL", "CKMB", "CKMBINDEX", "TROPONINI" in the last 168 hours.  BNP: Invalid input(s): "POCBNP"  CBG: Recent Labs  Lab 01/26/22 1230 01/26/22 1531 01/26/22 2253 01/27/22 0733 01/27/22 1133  GLUCAP 92 227* 185* 110* 135*     Microbiology: Results for orders placed or performed during the hospital encounter of 01/18/22  Blood Culture (routine x 2)     Status: None   Collection Time: 01/18/22 11:51 AM   Specimen: BLOOD  Result Value Ref Range Status   Specimen Description BLOOD LEFT AC  Final   Special Requests   Final    BOTTLES DRAWN AEROBIC AND ANAEROBIC Blood Culture results may not be optimal due to an excessive volume of blood received in culture bottles   Culture   Final  NO GROWTH 5 DAYS Performed at [facility], [address] [place], [place] [address]    Report Status 01/23/2022 FINAL  Final  Resp Panel by RT-PCR (Flu A&B, Covid) Anterior Nasal Swab     Status: None   Collection Time: 01/18/22 11:51 AM   Specimen: Anterior Nasal Swab  Result Value Ref Range Status   SARS Coronavirus 2 by RT PCR NEGATIVE NEGATIVE Final    Comment: (NOTE) SARS-CoV-2 target nucleic acids are NOT DETECTED.  The SARS-CoV-2 RNA is generally detectable in upper respiratory specimens during the acute phase of infection. The lowest concentration of SARS-CoV-2 viral copies this assay can detect is 138 copies/mL. A negative result does not preclude SARS-Cov-2 infection and should not be used as the sole basis for treatment or other patient management decisions. A negative result may occur with   improper specimen collection/handling, submission of specimen other than nasopharyngeal swab, presence of viral mutation(s) within the areas targeted by this assay, and inadequate number of viral copies(<138 copies/mL). A negative result must be combined with clinical observations, patient history, and epidemiological information. The expected result is Negative.  Fact Sheet for Patients:  EntrepreneurPulse.com.au  Fact Sheet for Healthcare Providers:  IncredibleEmployment.be  This test is no t yet approved or cleared by the [place] FDA and  has been authorized for detection and/or diagnosis of SARS-CoV-2 by FDA under an Emergency Use Authorization (EUA). This EUA will remain  in effect (meaning this test can be used) for the duration of the COVID-19 declaration under Section 564(b)(1) of the Act, 21 U.S.C.section 360bbb-3(b)(1), unless the authorization is terminated  or revoked sooner.       Influenza A by PCR NEGATIVE NEGATIVE Final   Influenza B by PCR NEGATIVE NEGATIVE Final    Comment: (NOTE) The Xpert Xpress SARS-CoV-2/FLU/RSV plus assay is intended as an aid in the diagnosis of influenza from Nasopharyngeal swab specimens and should not be used as a sole basis for treatment. Nasal washings and aspirates are unacceptable for Xpert Xpress SARS-CoV-2/FLU/RSV testing.  Fact Sheet for Patients: EntrepreneurPulse.com.au  Fact Sheet for Healthcare Providers: IncredibleEmployment.be  This test is not yet approved or cleared by the [place] FDA and has been authorized for detection and/or diagnosis of SARS-CoV-2 by FDA under an Emergency Use Authorization (EUA). This EUA will remain in effect (meaning this test can be used) for the duration of the COVID-19 declaration under Section 564(b)(1) of the Act, 21 U.S.C. section 360bbb-3(b)(1), unless the authorization is terminated  or revoked.  Performed at [facility], [address] [place], [place] [address]   Blood Culture (routine x 2)     Status: None (Preliminary result)   Collection Time: 01/18/22  2:30 PM   Specimen: BLOOD  Result Value Ref Range Status   Specimen Description BLOOD RIGHT ARM  Final   Special Requests   Final    BOTTLES DRAWN AEROBIC AND ANAEROBIC Blood Culture adequate volume   Culture  Setup Time PENDING  Incomplete   Culture   Final    NO GROWTH 5 DAYS Performed at [facility], [address], [place], [place] [address]    Report Status PENDING  Incomplete  Aerobic/Anaerobic Culture w Gram Stain (surgical/deep wound)     Status: None (Preliminary result)   Collection Time: 01/21/22  3:30 PM   Specimen: Abscess  Result Value Ref Range Status   Specimen Description   Final    ABSCESS GALL BLADDER Performed at [facility],  [place], [place] [address]    Special Requests   Final    NONE Performed at [facility], [address] [place], [place] [address]    Gram Stain   Final    FEW WBC PRESENT,BOTH PMN AND MONONUCLEAR RARE GRAM POSITIVE COCCI IN PAIRS    Culture   Final    FEW ESCHERICHIA COLI MODERATE STREPTOCOCCUS GALLOLYTICUS CULTURE REINCUBATED FOR BETTER GROWTH REISOLATING NO ANAEROBES ISOLATED Performed at [facility], [place] [address], [place], [place] [address]    Report Status PENDING  Incomplete    Coagulation Studies: No results for input(s): "LABPROT", "INR" in the last 72 hours.   Urinalysis: No results for input(s): "COLORURINE", "LABSPEC", "PHURINE", "GLUCOSEU", "HGBUR", "BILIRUBINUR", "KETONESUR", "PROTEINUR", "UROBILINOGEN", "NITRITE", "LEUKOCYTESUR" in the last 72 hours.  Invalid input(s): "APPERANCEUR"    Imaging: No results found.   Medications:    cefTRIAXone (ROCEPHIN)  IV 2 g (01/26/22 2119)   chlorproMAZINE (THORAZINE) 12.5 mg in sodium chloride 0.9 % 25 mL IVPB 12.5 mg  (01/27/22 0848)   methocarbamol (ROBAXIN) IV 500 mg (01/27/22 0625)   metronidazole 500 mg (01/27/22 1003)    amLODipine  10 mg Oral Daily   aspirin EC  81 mg Oral Daily   Chlorhexidine Gluconate Cloth  6 each Topical Q0600   donepezil  10 mg Oral Daily   feeding supplement (NEPRO CARB STEADY)  237 mL Oral TID BM   gabapentin  300 mg Oral QHS   heparin  5,000 Units Subcutaneous Q8H   hydrALAZINE  100 mg Oral Q8H   insulin aspart  0-15 Units Subcutaneous TID WC   insulin aspart  3 Units Subcutaneous TID WC   insulin detemir  12 Units Subcutaneous QHS   levothyroxine  137 mcg Oral Q0600   loratadine  10 mg Oral QPM   losartan  100 mg Oral QPM   metoprolol succinate  50 mg Oral Daily   pantoprazole  40 mg Oral Daily   sevelamer carbonate  2.4 g Oral Q breakfast   simvastatin  20 mg Oral q1800   sodium chloride flush  3 mL Intravenous Q12H   sodium chloride flush  5 mL Intracatheter Q8H   acetaminophen **OR** acetaminophen, albuterol, alum & mag hydroxide-simeth, chlorproMAZINE (THORAZINE) 12.5 mg in sodium chloride 0.9 % 25 mL IVPB, methocarbamol (ROBAXIN) IV  Assessment/ Plan:  [name] is a 76 y.o.  male with a past medical history of hypertension, end-stage renal disease, on hemodialysis on Monday Wednesday Friday schedule, diabetes mellitus, CVA who was brought to the ER with chief complaint of weakness.  [facility]/MWF/right chest PermCath  End-stage renal disease on hemodialysis.    Next treatment scheduled for Wednesday.   2. Anemia of chronic kidney disease  Lab Results  Component Value Date   HGB 9.6 (L) 01/27/2022    Hemoglobin remains within acceptable target.  No need for ESA's at this time.   3. Secondary Hyperparathyroidism: with outpatient labs: PTH 422, phosphorus 6.4, calcium 9.1 on 12/29/2021.   Lab Results  Component Value Date   CALCIUM 9.1 01/27/2022   CAION 0.83 (LL) 12/10/2020   PHOS 2.3 (L) 01/21/2022    Calcium at goal. Phosphorus  decreased. Will hold Sevelamer.   4.  Hypertension with chronic kidney disease.  Home regimen includes hydralazine and losartan.  Currently receiving these medications along with metoprolol.  Blood pressure stable for this patient.  5. Diabetes mellitus type II with chronic kidney disease  insulin dependent. Home regimen includes NovoLog and Levemir. Most recent hemoglobin A1c is 8.1 on 01/18/2022.  Currently prescribed Januvia as well.   6.  Acute cholecystitis seen on CT.  Surgery consulted.  IR placed percutaneous drain on 01/21/22 and stabilizing with medications.    LOS: [place] 9/5/20232:16 PM

## 2022-01-28 DIAGNOSIS — Z1619 Resistance to other specified beta lactam antibiotics: Secondary | ICD-10-CM | POA: Diagnosis not present

## 2022-01-28 DIAGNOSIS — A419 Sepsis, unspecified organism: Secondary | ICD-10-CM | POA: Diagnosis not present

## 2022-01-28 DIAGNOSIS — A498 Other bacterial infections of unspecified site: Secondary | ICD-10-CM | POA: Diagnosis not present

## 2022-01-28 DIAGNOSIS — K810 Acute cholecystitis: Secondary | ICD-10-CM | POA: Diagnosis not present

## 2022-01-28 LAB — GLUCOSE, CAPILLARY
Glucose-Capillary: 59 mg/dL — ABNORMAL LOW (ref 70–99)
Glucose-Capillary: 60 mg/dL — ABNORMAL LOW (ref 70–99)
Glucose-Capillary: 77 mg/dL (ref 70–99)
Glucose-Capillary: 163 mg/dL — ABNORMAL HIGH (ref 70–99)
Glucose-Capillary: 146 mg/dL — ABNORMAL HIGH (ref 70–99)
Glucose-Capillary: 154 mg/dL — ABNORMAL HIGH (ref 70–99)

## 2022-01-28 LAB — CBC
HCT: 29.4 % — ABNORMAL LOW (ref 39.0–52.0)
Hemoglobin: 9.4 g/dL — ABNORMAL LOW (ref 13.0–17.0)
MCH: 27.8 pg (ref 26.0–34.0)
MCHC: 32 g/dL (ref 30.0–36.0)
MCV: 87 fL (ref 80.0–100.0)
Platelets: 424 10*3/uL — ABNORMAL HIGH (ref 150–400)
RBC: 3.38 MIL/uL — ABNORMAL LOW (ref 4.22–5.81)
RDW: 17.2 % — ABNORMAL HIGH (ref 11.5–15.5)
WBC: 18 10*3/uL — ABNORMAL HIGH (ref 4.0–10.5)
nRBC: 0 % (ref 0.0–0.2)

## 2022-01-28 LAB — CARBAPENEM RESISTANCE PANEL
Carba Resistance IMP Gene: NOT DETECTED
Carba Resistance KPC Gene: NOT DETECTED
Carba Resistance NDM Gene: NOT DETECTED
Carba Resistance OXA48 Gene: NOT DETECTED
Carba Resistance VIM Gene: NOT DETECTED

## 2022-01-28 LAB — BASIC METABOLIC PANEL
Anion gap: 12 (ref 5–15)
BUN: 30 mg/dL — ABNORMAL HIGH (ref 8–23)
CO2: 25 mmol/L (ref 22–32)
Calcium: 8.7 mg/dL — ABNORMAL LOW (ref 8.9–10.3)
Chloride: 94 mmol/L — ABNORMAL LOW (ref 98–111)
Creatinine, Ser: 6.91 mg/dL — ABNORMAL HIGH (ref 0.61–1.24)
GFR, Estimated: 8 mL/min — ABNORMAL LOW (ref >60)
Glucose, Bld: 72 mg/dL (ref 70–99)
Potassium: 4.5 mmol/L (ref 3.5–5.1)
Sodium: 131 mmol/L — ABNORMAL LOW (ref 135–145)

## 2022-01-28 LAB — MAGNESIUM: Magnesium: 1.9 mg/dL (ref 1.7–2.4)

## 2022-01-28 MED ORDER — METRONIDAZOLE 500 MG PO TABS
500.0000 mg | ORAL_TABLET | Freq: Two times a day (BID) | ORAL | Status: DC
  Administered 2022-01-28 – 2022-02-02 (×9): 500 mg via ORAL
  Filled 2022-01-28 (×9): qty 1

## 2022-01-28 MED ORDER — INSULIN DETEMIR 100 UNIT/ML ~~LOC~~ SOLN
10.0000 [IU] | Freq: Every day | SUBCUTANEOUS | Status: DC
  Administered 2022-01-28 – 2022-02-01 (×5): 10 [IU] via SUBCUTANEOUS
  Filled 2022-01-28 (×7): qty 0.1

## 2022-01-28 MED ORDER — AMOXICILLIN-POT CLAVULANATE 500-125 MG PO TABS
1.0000 | ORAL_TABLET | Freq: Two times a day (BID) | ORAL | Status: DC
  Administered 2022-01-28: 500 mg via ORAL
  Filled 2022-01-28: qty 1

## 2022-01-28 MED ORDER — HEPARIN SODIUM (PORCINE) 1000 UNIT/ML IJ SOLN
INTRAMUSCULAR | Status: AC
  Administered 2022-01-28: 10000 [IU]
  Filled 2022-01-28: qty 10

## 2022-01-28 MED ORDER — DEXTROSE 5 % IV SOLN
0.9400 g | INTRAVENOUS | Status: DC
  Administered 2022-01-28 – 2022-02-02 (×6): 0.94 g via INTRAVENOUS
  Filled 2022-01-28 (×6): qty 4.51

## 2022-01-28 NOTE — Plan of Care (Signed)

## 2022-01-28 NOTE — Progress Notes (Signed)
Hd tx of 3.5hrs completed. 84L total vol processed. No complications. Report given to [employer], rn. Total uf removed: 1580m Post hd wt: 73.2kgs Post hd v/s: 98.8 166/65(91) 83 21 99%

## 2022-01-28 NOTE — Consult Note (Signed)
NAME: [name]  DOB: [birth year]  MRN: [id number]  Date/Time: 01/28/2022 3:01 PM  REQUESTING PROVIDER: Dr.Blumberg Subjective:  REASON FOR CONSULT:  ?CRE infection  Joseph Mcpherson is a 76 y.o. with a history of ESRD on dialysis, DM, HTN Presented from home on 01/18/22 with fever and some altered mental status- intially no source was identified and he was put on broad spectrum antibiotics- vanco/cefepime/falgyl On 01/20/22 he had CT abdomen and it revealed Gallbladder wall thickening with pericholecystic fluid and irregular gallbladder wall enhancement. Concerning for acute cholecystitis Seen by surgeon who recommended IR place cholecystostomy  and it was done on 01/21/22. Culture came back today as Ecoli which was MDRO including meropenem and I am asked to see the patient PT 's wife and daughter gives history and patient add s to it HE has traveled a lot Was in [place] in Dec for a month and got dialysis there- Did not get admitted to any hospital- was not on a ny antibiotics Last infection was a few years ago for pneumonia  Pt says he is feeling better since he came to the hospital  Past Medical History:  Diagnosis Date   Clotting disorder ([place])    per daughter, "when patient travels, his feet swell"   Diabetes mellitus without complication ([place])    type II   Diabetic eye exam ([place]) 12/2019   ESRD (end stage renal disease) ([place])    MWF- [facility]   GERD (gastroesophageal reflux disease)    [name] (hard of hearing)    Hyperlipidemia    Hypertension    Hypothyroidism    Thyroid disease     Past Surgical History:  Procedure Laterality Date   AV FISTULA PLACEMENT Right 10/22/2017   Procedure: ARTERIOVENOUS (AV) BRACHIOCEPHALIC FISTULA CREATION RIGHT UPPER ARM;  Surgeon: Conrad Geauga, MD;  Location: [facility];  Service: Vascular;  Laterality: Right;   AV FISTULA PLACEMENT Left 02/03/2019   Procedure: INSERTION OF ARTERIOVENOUS (AV) GORE-TEX GRAFT ARM (BRACHIAL AXILLARY );  Surgeon: [name], MD;  Location: [facility];  Service: Vascular;  Laterality: Left;   AV FISTULA PLACEMENT Left 12/10/2020   Procedure: LEFT ARM BRACHIOCEPHALIC ARTERIOVENOUS (AV) FISTULA CREATION;  Surgeon: [name], MD;  Location: [facility];  Service: Vascular;  Laterality: Left;   IR PERC CHOLECYSTOSTOMY  01/21/2022   UPPER EXTREMITY ANGIOGRAPHY Left 09/19/2019   Procedure: UPPER EXTREMITY ANGIOGRAPHY;  Surgeon: [name], MD;  Location: [facility];  Service: Cardiovascular;  Laterality: Left;    Social History   Socioeconomic History   Marital status: Married    Spouse name: Not on file   Number of children: Not on file   Years of education: 12   Highest education level: 12th grade  Occupational History   Occupation: Retired  Tobacco Use   Smoking status: Never   Smokeless tobacco: Current    Types: Snuff  Vaping Use   Vaping Use: Never used  Substance and Sexual Activity   Alcohol use: No   Drug use: No   Sexual activity: Not Currently  Other Topics Concern   Not on file  Social History Narrative   Not on file   Social Determinants of Health   Financial Resource Strain: Low Risk  (06/19/2021)   Overall Financial Resource Strain (CARDIA)    Difficulty of Paying Living Expenses: Not hard at all  Food Insecurity: No Food Insecurity (06/19/2021)   Hunger Vital Sign    Worried About Running Out of Food  in the Last Year: Never true    [place] in the Last Year: Never true  Transportation Needs: No Transportation Needs (06/19/2021)   PRAPARE - Hydrologist (Medical): No    Lack of Transportation (Non-Medical): No  Physical Activity: Sufficiently Active (06/19/2021)   Exercise Vital Sign    Days of Exercise per Week: 7 days    Minutes of Exercise per Session: 30 min  Stress: No Stress Concern Present (06/19/2021)   [place]    Feeling of Stress : Not at all  Social  Connections: [place] (06/19/2021)   Social Connection and Isolation Panel [NHANES]    Frequency of Communication with Friends and Family: More than three times a week    Frequency of Social Gatherings with Friends and Family: More than three times a week    Attends Religious Services: More than 4 times per year    Active Member of [employer] or Organizations: Yes    Attends Music therapist: More than 4 times per year    Marital Status: Married  Human resources officer Violence: Not At Risk (06/19/2021)   Humiliation, Afraid, Rape, and Kick questionnaire    Fear of Current or Ex-Partner: No    Emotionally Abused: No    Physically Abused: No    Sexually Abused: No    Family History  Problem Relation Age of Onset   Diabetes Mother    Hyperlipidemia Mother    Hypertension Father    No Known Allergies I? Current Facility-Administered Medications  Medication Dose Route Frequency Provider Last Rate Last Admin   acetaminophen (TYLENOL) tablet 650 mg  650 mg Oral Q6H PRN Fuller Plan A, MD   650 mg at 01/26/22 1547   Or   acetaminophen (TYLENOL) suppository 650 mg  650 mg Rectal Q6H PRN Fuller Plan A, MD       albuterol (PROVENTIL) (2.5 MG/3ML) 0.083% nebulizer solution 2.5 mg  2.5 mg Nebulization Q6H PRN Fuller Plan A, MD       alum & mag hydroxide-simeth (MAALOX/MYLANTA) 200-200-20 MG/5ML suspension 30 mL  30 mL Oral Q6H PRN Tamala Julian, Rondell A, MD   30 mL at 01/18/22 2239   amLODipine (NORVASC) tablet 10 mg  10 mg Oral Daily Enzo Bi, MD   10 mg at 01/28/22 1257   aspirin EC tablet 81 mg  81 mg Oral Daily Smith, Rondell A, MD   81 mg at 01/28/22 1257   ceftazidime-avibactam (AVYCAZ) 0.94 g in dextrose 5 % 50 mL IVPB  0.94 g Intravenous Q24H Fritzi Mandes, MD       Chlorhexidine Gluconate Cloth 2 % PADS 6 each  6 each Topical Q0600 Liana Gerold, MD   6 each at 01/28/22 0517   chlorproMAZINE (THORAZINE) 12.5 mg in sodium chloride 0.9 % 25 mL IVPB  12.5 mg Intravenous  Q6H PRN Enzo Bi, MD 50 mL/hr at 01/27/22 1726 12.5 mg at 01/27/22 1726   donepezil (ARICEPT) tablet 10 mg  10 mg Oral Daily Smith, Rondell A, MD   10 mg at 01/28/22 1257   feeding supplement (NEPRO CARB STEADY) liquid 237 mL  237 mL Oral TID BM Enzo Bi, MD   237 mL at 01/28/22 1258   gabapentin (NEURONTIN) capsule 300 mg  300 mg Oral QHS Smith, Rondell A, MD   300 mg at 01/27/22 2219   heparin injection 5,000 Units  5,000 Units Subcutaneous Q8H Lai,  Otila Kluver, MD   5,000 Units at 01/28/22 1302   hydrALAZINE (APRESOLINE) tablet 100 mg  100 mg Oral Q8H Enzo Bi, MD   100 mg at 01/28/22 1435   insulin aspart (novoLOG) injection 0-15 Units  0-15 Units Subcutaneous TID WC Enzo Bi, MD   3 Units at 01/28/22 1314   insulin aspart (novoLOG) injection 3 Units  3 Units Subcutaneous TID WC Enzo Bi, MD   3 Units at 01/27/22 0843   insulin detemir (LEVEMIR) injection 10 Units  10 Units Subcutaneous QHS Fritzi Mandes, MD       levothyroxine (SYNTHROID) tablet 137 mcg  137 mcg Oral Q0600 Fuller Plan A, MD   137 mcg at 01/28/22 0517   loratadine (CLARITIN) tablet 10 mg  10 mg Oral QPM Smith, Rondell A, MD   10 mg at 01/27/22 1656   losartan (COZAAR) tablet 100 mg  100 mg Oral QPM Enzo Bi, MD   100 mg at 01/27/22 1656   methocarbamol (ROBAXIN) 500 mg in dextrose 5 % 50 mL IVPB  500 mg Intravenous Q6H PRN Enzo Bi, MD 110 mL/hr at 01/28/22 1307 500 mg at 01/28/22 1307   metoprolol succinate (TOPROL-XL) 24 hr tablet 50 mg  50 mg Oral Daily Smith, Rondell A, MD   50 mg at 01/28/22 1258   metroNIDAZOLE (FLAGYL) tablet 500 mg  500 mg Oral Q12H Fritzi Mandes, MD       pantoprazole (PROTONIX) EC tablet 40 mg  40 mg Oral Daily Smith, Rondell A, MD   40 mg at 01/28/22 1257   simvastatin (ZOCOR) tablet 20 mg  20 mg Oral q1800 Smith, Rondell A, MD   20 mg at 01/27/22 1656   sodium chloride flush (NS) 0.9 % injection 3 mL  3 mL Intravenous Q12H Smith, Rondell A, MD   3 mL at 01/28/22 0803   sodium chloride flush (NS) 0.9  % injection 5 mL  5 mL Intracatheter Q8H Suttle, Dylan J, MD   5 mL at 01/28/22 1302     Abtx:  Anti-infectives (From admission, onward)    Start     Dose/Rate Route Frequency Ordered Stop   01/28/22 2200  metroNIDAZOLE (FLAGYL) tablet 500 mg        500 mg Oral Every 12 hours 01/28/22 1443     01/28/22 1800  ceftazidime-avibactam (AVYCAZ) 0.94 g in dextrose 5 % 50 mL IVPB        0.94 g 25 mL/hr over 2 Hours Intravenous Every 24 hours 01/28/22 1443     01/28/22 1000  amoxicillin-clavulanate (AUGMENTIN) 500-125 MG per tablet 500 mg  Status:  Discontinued        1 tablet Oral Every 12 hours 01/28/22 0940 01/28/22 1443   01/21/22 2200  cefTRIAXone (ROCEPHIN) 2 g in sodium chloride 0.9 % 100 mL IVPB  Status:  Discontinued        2 g 200 mL/hr over 30 Minutes Intravenous Every 24 hours 01/21/22 1503 01/28/22 0940   01/21/22 1800  metroNIDAZOLE (FLAGYL) IVPB 500 mg  Status:  Discontinued        500 mg 100 mL/hr over 60 Minutes Intravenous Every 12 hours 01/21/22 1503 01/28/22 0940   01/19/22 1800  vancomycin (VANCOREADY) IVPB 750 mg/150 mL  Status:  Discontinued        750 mg 150 mL/hr over 60 Minutes Intravenous Once per day on Mon Wed Fri 01/19/22 0954 01/20/22 2125   01/19/22 0953  vancomycin variable dose per unstable renal function (  pharmacist dosing)  Status:  Discontinued         Does not apply See admin instructions 01/19/22 0954 01/20/22 2125   01/19/22 0800  ceFEPIme (MAXIPIME) 1 g in sodium chloride 0.9 % 100 mL IVPB  Status:  Discontinued        1 g 200 mL/hr over 30 Minutes Intravenous Every 24 hours 01/18/22 2027 01/21/22 1503   01/18/22 2030  vancomycin (VANCOREADY) IVPB 1500 mg/300 mL        1,500 mg 150 mL/hr over 120 Minutes Intravenous  Once 01/18/22 2027 01/19/22 0111   01/18/22 1800  vancomycin (VANCOREADY) IVPB 1500 mg/300 mL  Status:  Discontinued        1,500 mg 150 mL/hr over 120 Minutes Intravenous  Once 01/18/22 1729 01/18/22 2027   01/18/22 1415  ceFEPIme  (MAXIPIME) 2 g in sodium chloride 0.9 % 100 mL IVPB        2 g 200 mL/hr over 30 Minutes Intravenous  Once 01/18/22 1408 01/18/22 1503   01/18/22 1415  vancomycin (VANCOCIN) IVPB 1000 mg/200 mL premix  Status:  Discontinued        1,000 mg 200 mL/hr over 60 Minutes Intravenous  Once 01/18/22 1408 01/18/22 1413   01/18/22 1415  vancomycin (VANCOREADY) IVPB 1500 mg/300 mL  Status:  Discontinued        1,500 mg 150 mL/hr over 120 Minutes Intravenous  Once 01/18/22 1413 01/18/22 1729       REVIEW OF SYSTEMS:  Const:  fever,  chills, negative weight loss Eyes: negative diplopia or visual changes, negative eye pain ENT: negative coryza, negative sore throat Resp: +cough, no hemoptysis, dyspnea Cards: negative for chest pain, palpitations, lower extremity edema GU: negative for frequency, dysuria and hematuria GI: Negative for abdominal pain, diarrhea, bleeding, constipation Skin: negative for rash and pruritus Heme: negative for easy bruising and gum/nose bleeding MS: general weakness Neurolo:some confusion and memory problems  Psych: negative for feelings of anxiety, depression  Endocrine: negative for thyroid, diabetes Allergy/Immunology- negative for any medication or food allergies ? Pertinent Positives include : Objective:  VITALS:  BP (!) 166/65 (BP Location: Right Arm)   Pulse 83   Temp 98.8 F (37.1 C) (Oral)   Resp (!) 21   Ht '5\' 4"'$  (1.626 m)   Wt 69.4 kg   SpO2 99%   BMI 26.26 kg/m  LDA Foley Central line Other drainage tubes PHYSICAL EXAM:  General: Alert, cooperative, no distress, appears stated age.  Head: Normocephalic, without obvious abnormality, atraumatic. Eyes: Conjunctivae clear, anicteric sclerae. Pupils are equal ENT Nares normal. No drainage or sinus tenderness. Lips, mucosa, and tongue normal. No Thrush Neck: Supple, symmetrical, no adenopathy, thyroid: non tender no carotid bruit and no JVD. Back: No CVA tenderness. Lungs: Clear to  auscultation bilaterally. No Wheezing or Rhonchi. No rales. Heart: Regular rate and rhythm, no murmur, rub or gallop. Abdomen: Soft, rt upper quadrant tube- green fluid Extremities: atraumatic, no cyanosis. No edema. No clubbing Skin: No rashes or lesions. Or bruising Lymph: Cervical, supraclavicular normal. Neurologic: Grossly non-focal Pertinent Labs Lab Results CBC    Component Value Date/Time   WBC 18.0 (H) 01/28/2022 0547   RBC 3.38 (L) 01/28/2022 0547   HGB 9.4 (L) 01/28/2022 0547   HGB 12.0 (L) 09/12/2013 0240   HCT 29.4 (L) 01/28/2022 0547   HCT 36.5 (L) 09/12/2013 0240   PLT 424 (H) 01/28/2022 0547   PLT 241 09/12/2013 0240   MCV 87.0 01/28/2022 0547  MCV 84 09/12/2013 0240   MCH 27.8 01/28/2022 0547   MCHC 32.0 01/28/2022 0547   RDW 17.2 (H) 01/28/2022 0547   RDW 15.4 (H) 09/12/2013 0240   LYMPHSABS 0.9 01/18/2022 1151   LYMPHSABS 1.2 09/12/2013 0240   MONOABS 2.4 (H) 01/18/2022 1151   MONOABS 0.5 09/12/2013 0240   EOSABS 5.8 (H) 01/18/2022 1151   EOSABS 0.0 09/12/2013 0240   BASOSABS 0.1 01/18/2022 1151   BASOSABS 0.0 09/12/2013 0240       Latest Ref Rng & Units 01/28/2022    5:47 AM 01/27/2022    6:38 AM 01/26/2022    5:52 AM  CMP  Glucose 70 - 99 mg/dL 72  101  130   BUN 8 - 23 mg/dL 30  23  41   Creatinine 0.61 - 1.24 mg/dL 6.91  5.46  7.68   Sodium 135 - 145 mmol/L 131  132  127   Potassium 3.5 - 5.1 mmol/L 4.5  4.3  4.4   Chloride 98 - 111 mmol/L 94  93  92   CO2 22 - 32 mmol/L '25  25  25   '$ Calcium 8.9 - 10.3 mg/dL 8.7  9.1  8.5       Microbiology: Recent Results (from the past 240 hour(s))  Aerobic/Anaerobic Culture w Gram Stain (surgical/deep wound)     Status: None   Collection Time: 01/21/22  3:30 PM   Specimen: Abscess  Result Value Ref Range Status   Specimen Description   Final    ABSCESS GALL BLADDER Performed at [facility], [address], [place], [place] [address]    Special Requests   Final    NONE Performed at  [facility], [address] [place], [place] [address]    Gram Stain   Final    FEW WBC PRESENT,BOTH PMN AND MONONUCLEAR RARE GRAM POSITIVE COCCI IN PAIRS    Culture   Final    FEW ESCHERICHIA COLI MULTI-DRUG RESISTANT ORGANISM CRITICAL RESULT CALLED TO, READ BACK BY AND VERIFIED WITH: PHARMD K. Stitely [id number] FCP MODERATE STREPTOCOCCUS GALLOLYTICUS NO ANAEROBES ISOLATED Performed at [facility], [place] [address], [place],  [address]    Report Status 01/28/2022 FINAL  Final   Organism ID, Bacteria ESCHERICHIA COLI  Final   Organism ID, Bacteria STREPTOCOCCUS GALLOLYTICUS  Final      Susceptibility   Escherichia coli - MIC*    AMPICILLIN >=32 RESISTANT Resistant     CEFAZOLIN >=64 RESISTANT Resistant     CEFEPIME 4 INTERMEDIATE Intermediate     CEFTAZIDIME >=64 RESISTANT Resistant     CEFTRIAXONE >=64 RESISTANT Resistant     CIPROFLOXACIN >=4 RESISTANT Resistant     GENTAMICIN <=1 SENSITIVE Sensitive     IMIPENEM RESISTANT Resistant     TRIMETH/SULFA <=20 SENSITIVE Sensitive     AMPICILLIN/SULBACTAM >=32 RESISTANT Resistant     PIP/TAZO 64 INTERMEDIATE Intermediate     * FEW ESCHERICHIA COLI   Streptococcus gallolyticus - MIC*    PENICILLIN 0.12 SENSITIVE Sensitive     CEFTRIAXONE <=0.12 SENSITIVE Sensitive     ERYTHROMYCIN >=8 RESISTANT Resistant     LEVOFLOXACIN >=16 RESISTANT Resistant     VANCOMYCIN 0.25 SENSITIVE Sensitive     * MODERATE STREPTOCOCCUS GALLOLYTICUS  Carbapenem Resistance Panel     Status: None   Collection Time: 01/21/22  3:30 PM  Result Value Ref Range Status   Carba Resistance IMP Gene NOT DETECTED NOT DETECTED Final   Carba  Resistance VIM Gene NOT DETECTED NOT DETECTED Final   Carba Resistance NDM Gene NOT DETECTED NOT DETECTED Final   Carba Resistance KPC Gene NOT DETECTED NOT DETECTED Final   Carba Resistance OXA48 Gene NOT DETECTED NOT DETECTED Final    Comment: (NOTE) Cepheid Carba-R is an FDA-cleared nucleic acid  amplification test  (NAAT)for the detection and differentiation of genes encoding the  most prevalent carbapenemases in bacterial isolate samples. Carbapenemase gene identification and implementation of comprehensive  infection control measures are recommended by the CDC to prevent the  spread of the resistant organisms. Performed at [facility], [place] [address] [place], [place] [address]     IMAGING RESULTS:  I have personally reviewed the films ?Gall bladder wall is thickened and hazy GB is mildly distended   Impression/Recommendation ? 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

## 2022-01-28 NOTE — Plan of Care (Signed)
Problem: Education: Goal: Ability to describe self-care measures that may prevent or decrease complications (Diabetes Survival Skills Education) will improve 01/28/2022 0307 by Avon Gully, RN Outcome: Progressing 01/28/2022 0307 by Avon Gully, RN Outcome: Progressing Goal: Individualized Educational Video(s) 01/28/2022 0307 by Avon Gully, RN Outcome: Progressing 01/28/2022 0307 by Avon Gully, RN Outcome: Progressing   Problem: Coping: Goal: Ability to adjust to condition or change in health will improve 01/28/2022 0307 by Avon Gully, RN Outcome: Progressing 01/28/2022 0307 by Avon Gully, RN Outcome: Progressing   Problem: Fluid Volume: Goal: Ability to maintain a balanced intake and output will improve 01/28/2022 0307 by Avon Gully, RN Outcome: Progressing 01/28/2022 0307 by Avon Gully, RN Outcome: Progressing   Problem: Health Behavior/Discharge Planning: Goal: Ability to identify and utilize available resources and services will improve 01/28/2022 0307 by Avon Gully, RN Outcome: Progressing 01/28/2022 0307 by Avon Gully, RN Outcome: Progressing Goal: Ability to manage health-related needs will improve 01/28/2022 0307 by Avon Gully, RN Outcome: Progressing 01/28/2022 0307 by Avon Gully, RN Outcome: Progressing   Problem: Metabolic: Goal: Ability to maintain appropriate glucose levels will improve 01/28/2022 0307 by Avon Gully, RN Outcome: Progressing 01/28/2022 0307 by Avon Gully, RN Outcome: Progressing   Problem: Nutritional: Goal: Maintenance of adequate nutrition will improve 01/28/2022 0307 by Avon Gully, RN Outcome: Progressing 01/28/2022 0307 by Avon Gully, RN Outcome: Progressing Goal: Progress toward achieving an optimal weight will improve 01/28/2022 0307 by Avon Gully, RN Outcome: Progressing 01/28/2022 0307 by Avon Gully, RN Outcome: Progressing   Problem: Skin Integrity: Goal: Risk for impaired skin  integrity will decrease 01/28/2022 0307 by Avon Gully, RN Outcome: Progressing 01/28/2022 0307 by Avon Gully, RN Outcome: Progressing   Problem: Tissue Perfusion: Goal: Adequacy of tissue perfusion will improve 01/28/2022 0307 by Avon Gully, RN Outcome: Progressing 01/28/2022 0307 by Avon Gully, RN Outcome: Progressing   Problem: Education: Goal: Knowledge of General Education information will improve Description: Including pain rating scale, medication(s)/side effects and non-pharmacologic comfort measures 01/28/2022 0307 by Avon Gully, RN Outcome: Progressing 01/28/2022 0307 by Avon Gully, RN Outcome: Progressing   Problem: Health Behavior/Discharge Planning: Goal: Ability to manage health-related needs will improve 01/28/2022 0307 by Avon Gully, RN Outcome: Progressing 01/28/2022 0307 by Avon Gully, RN Outcome: Progressing   Problem: Clinical Measurements: Goal: Ability to maintain clinical measurements within normal limits will improve 01/28/2022 0307 by Avon Gully, RN Outcome: Progressing 01/28/2022 0307 by Avon Gully, RN Outcome: Progressing Goal: Will remain free from infection 01/28/2022 0307 by Avon Gully, RN Outcome: Progressing 01/28/2022 0307 by Avon Gully, RN Outcome: Progressing Goal: Diagnostic test results will improve 01/28/2022 0307 by Avon Gully, RN Outcome: Progressing 01/28/2022 0307 by Avon Gully, RN Outcome: Progressing Goal: Respiratory complications will improve 01/28/2022 0307 by Avon Gully, RN Outcome: Progressing 01/28/2022 0307 by Avon Gully, RN Outcome: Progressing Goal: Cardiovascular complication will be avoided 01/28/2022 0307 by Avon Gully, RN Outcome: Progressing 01/28/2022 0307 by Avon Gully, RN Outcome: Progressing   Problem: Activity: Goal: Risk for activity intolerance will decrease 01/28/2022 0307 by Avon Gully, RN Outcome: Progressing 01/28/2022 0307 by Avon Gully, RN Outcome:  Progressing   Problem: Nutrition: Goal: Adequate nutrition will be maintained 01/28/2022 0307 by Avon Gully, RN Outcome: Progressing 01/28/2022 0307 by Avon Gully, RN Outcome: Progressing   Problem: Coping: Goal: Level of anxiety will decrease 01/28/2022 0307 by Avon Gully, RN Outcome: Progressing 01/28/2022 0307 by Avon Gully, RN Outcome: Progressing   Problem: Elimination: Goal: Will not experience complications related to bowel motility 01/28/2022 0307  by Avon Gully, RN Outcome: Progressing 01/28/2022 0307 by Avon Gully, RN Outcome: Progressing Goal: Will not experience complications related to urinary retention 01/28/2022 0307 by Avon Gully, RN Outcome: Progressing 01/28/2022 0307 by Avon Gully, RN Outcome: Progressing   Problem: Pain Managment: Goal: General experience of comfort will improve 01/28/2022 0307 by Avon Gully, RN Outcome: Progressing 01/28/2022 0307 by Avon Gully, RN Outcome: Progressing   Problem: Safety: Goal: Ability to remain free from injury will improve 01/28/2022 0307 by Avon Gully, RN Outcome: Progressing 01/28/2022 0307 by Avon Gully, RN Outcome: Progressing   Problem: Skin Integrity: Goal: Risk for impaired skin integrity will decrease 01/28/2022 0307 by Avon Gully, RN Outcome: Progressing 01/28/2022 0307 by Avon Gully, RN Outcome: Progressing

## 2022-01-28 NOTE — TOC Progression Note (Signed)
Transition of Care [facility]) - Progression Note    Patient Details  Name: Joseph Mcpherson MRN: [id number] Date of Birth: May 23, 1946  [facility] CM/SW Contact  Laurena Slimmer, RN Phone Number: 01/28/2022, 1:03 AM  Clinical Narrative:    Spoke with patient's daughter [name] regarding facilities. [name] may be considering a home discharge and hiring private care givers. Advised family would be responsible for coordinating transportation to and from dialysis. Family does not prefer [facility] facilities  as they are too far for patient's spouse to travel. She also does not  bed offer for [facility]. Private pay may be an option for some of the facilities that declined like [facility]. [name] will speak with admissions at [facility].    Expected Discharge Plan: Skilled Nursing Facility Barriers to Discharge: Ship broker, Continued Medical Work up, SNF Pending bed offer  Expected Discharge Plan and Services Expected Discharge Plan: [place] Choice: [place] arrangements for the past 2 months: Single Family Home                                       Social Determinants of Health (SDOH) Interventions    Readmission Risk Interventions     No data to display

## 2022-01-28 NOTE — TOC Progression Note (Signed)
Transition of Care [facility]) - Progression Note    Patient Details  Name: Devaris Quirk MRN: [id number] Date of Birth: April 05, 1946  Transition of Care [facility]) CM/SW Contact  Laurena Slimmer, RN Phone Number: 01/28/2022, 2:48 PM  Clinical Narrative:    Attempt to reach patient's daughter regarding bed offer, and facilities. No answer. Message left.   Retrieved message from [facility] admissions. Patient will not be accepted due to chair time.   Message received via secure chat requesting dialysis coordinator to coordinate a different chair time for patient admission to [place]. CM reached out to [facility], Admission Director. Per DON patient will not be accepted to facility. Capacity for dialysis patient has been reached. MD and patient's daughter notified. Patient daughter advised of current bed offers and that decision will have to be rendered as Josem Kaufmann is needed.   Expected Discharge Plan: Skilled Nursing Facility Barriers to Discharge: Ship broker, Continued Medical Work up, SNF Pending bed offer  Expected Discharge Plan and Services Expected Discharge Plan: [place] Choice: [place] arrangements for the past 2 months: Single Family Home                                       Social Determinants of Health (SDOH) Interventions    Readmission Risk Interventions     No data to display

## 2022-01-28 NOTE — Inpatient Diabetes Management (Signed)
Inpatient Diabetes Program Recommendations  AACE/ADA: New Consensus Statement on Inpatient Glycemic Control   Target Ranges:  Prepandial:   less than 140 mg/dL      Peak postprandial:   less than 180 mg/dL (1-2 hours)      Critically ill patients:  140 - 180 mg/dL    Latest Reference Range & Units 01/28/22 08:00 01/28/22 08:05  Glucose-Capillary 70 - 99 mg/dL 59 (L) 60 (L)    Latest Reference Range & Units 01/27/22 07:33 01/27/22 11:33 01/27/22 15:58 01/27/22 20:47  Glucose-Capillary 70 - 99 mg/dL 110 (H) 135 (H) 114 (H) 181 (H)   Review of Glycemic Control  Current orders for Inpatient glycemic control: Levemir 12 units QHS, Novolog 3 units TID with meals, Novolog 0-15 units TID with meals  Inpatient Diabetes Program Recommendations:    Insulin: Please consider decreasing Levemir to 10 units QHS.  Thanks, Barnie Alderman, RN, MSN, [place] Diabetes Coordinator Inpatient Diabetes Program [phone] (Team Pager from 8am to [place])

## 2022-01-28 NOTE — Progress Notes (Signed)
[place] [facility] [place] NAME: Joseph Mcpherson    MR#:  [id number]  DATE OF BIRTH:  1945-08-24  SUBJECTIVE:  wife at bedside. Patient having smiled hiccups. Overall slowly improving. No fever. Had dialysis this morning.    VITALS:  Blood pressure (!) 166/65, pulse 83, temperature 98.8 F (37.1 C), temperature source Oral, resp. rate (!) 21, height '5\' 4"'$  (1.626 m), weight 69.4 kg, SpO2 99 %.  PHYSICAL EXAMINATION:   GENERAL:  76 y.o.-year-old patient lying in the bed with no acute distress.  LUNGS: Normal breath sounds bilaterally, no wheezing, rales, rhonchi.  CARDIOVASCULAR: S1, S2 normal. No murmurs, rubs, or gallops.  ABDOMEN: Soft, nontender, nondistended. Bowel sounds present. Gallbladder drain + EXTREMITIES: No  edema b/l.    NEUROLOGIC: nonfocal  patient is alert and awake SKIN: per RN  LABORATORY PANEL:  CBC Recent Labs  Lab 01/28/22 0547  WBC 18.0*  HGB 9.4*  HCT 29.4*  PLT 424*    Chemistries  Recent Labs  Lab 01/28/22 0547  NA 131*  K 4.5  CL 94*  CO2 25  GLUCOSE 72  BUN 30*  CREATININE 6.91*  CALCIUM 8.7*  MG 1.9    Assessment and Plan  Joseph Mcpherson is a 76 y.o. 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     Acute cholecystitis S/p chole tube on 8/30 --dx by CT c/a/p.   --GenSurg consulted, rec chole tube --started on vanc/cefepime on presentation, then switched to ceftriaxone and flagyl on 8/30 --CT a/p w contrast 9/2 showed no abscess formation or other acute finding other than cholecystitis --cont ceftriaxone and flagyl while inpatient--changed to Po augmentin --chole tube care and flushes --outpatient surgical f/u in 3-4 weeks --Percutaneous cholecystostomy drain to remain in place at least 6 weeks. Recommend fluoroscopy with injection of the drain in IR to evaluate for patency of the cystic  duct. --GB Fluid culture DREcli and Strepto gallolyticus --ID consult to help with abxs   Leukocytosis  --repeat CT a/p w contrast 9/2 showed no abscess formation or other acute finding other than cholecystitis --WBC 34K--18K   Sepsis --On admission patient noted to be febrile up to 101.4 F with mild tachypnea, and labs significant for WBC 34.6 with lactic acid reassuring at 1.  Source acute cholecystitis --sepsis improving   Acute metabolic encephalopathy, improved --on presentation, somnolent, altered, due to infection.  Started to become more alert  now.   Hyperkalemia due to missing HD --  K 4.5   ESRD on HD MWF Patient normally dialyzes Monday, Wednesday, Friday, but missed dialysis Friday due to not feeling well.   -HD per nephrology   Weakness Patient noted to be grossly weak needing assistance to ambulate. - CT scan of the brain negative   --PT/OT rec SNF rehab   Poor oral intake --started PTA, but wife said pt eats little at baseline.  Wife declined temp tube feeds. --Nepro TID   Essential hypertension --BP has been elevated on home BP meds --cont Toprol --cont hydralazine to 100 mg q8h --cont losartan to 100 mg q evening   Hyponatremia, acute --Suspect patient likely at least partially fluid overloaded recent missed dialysis. --monitor Na    Anemia chronic kidney disease  --hgb 9.4  Alzheimer's disease -Delirium precautions -Continue donepezil   Insulin-dependent diabetes mellitus type 2, uncontrolled With Hyperglycemia --A1c 8.1. --Home medication  regimen includes Januvia 50 mg daily, NovoLog 10 units subcu nightly, and Levemir 20 units nightly. --hypoglycemic on 8/30 due to NPO --cont Levemir 12u nightly --SSI mod scale --cont mealtime 3u TID   Hypothyroidism -Continue levothyroxine   Mixed hyperlipidemia -Continue statin   History of CVA -Continue statin and aspirin   GERD -Continue Protonix     DVT prophylaxis: Heparin SQ Code  Status: Full code  Family Communication: wife updated at bedside today Spoke with dter Joseph Mcpherson on the phone  Level of care: Med-Surg Status is: Inpatient Remains inpatient appropriate because: ID consult pending regarding further additional antibiotic need. Family trying to decide on rehab places that have been offered. Life is not able to travel to [place].    TOTAL TIME TAKING CARE OF THIS PATIENT: 35 minutes.  >50% time spent on counselling and coordination of care  Note: This dictation was prepared with Dragon dictation along with smaller phrase technology. Any transcriptional errors that result from this process are unintentional.  Fritzi Mandes M.D    [facility]   CC: Primary care physician; [name], MD

## 2022-01-28 NOTE — Progress Notes (Signed)
[facility]  ROUNDING NOTE   Subjective:   Patient seen and evaluated during dialysis   HEMODIALYSIS FLOWSHEET:  Blood Flow Rate (mL/min): 200 mL/min Arterial Pressure (mmHg): -200 mmHg Venous Pressure (mmHg): 190 mmHg TMP (mmHg): 3 mmHg Ultrafiltration Rate (mL/min): 514 mL/min Dialysate Flow Rate (mL/min): 300 ml/min Dialysis Fluid Bolus: Normal Saline Bolus Amount (mL): 300 mL  Denies abdominal pain today Continues to complain of poor appetite, states he does not want hiccups to return if he eats   Objective:  Vital signs in last 24 hours:  Temp:  [98 F (36.7 C)-98.8 F (37.1 C)] 98.8 F (37.1 C) (09/06 1214) Pulse Rate:  [61-83] 83 (09/06 1214) Resp:  [13-21] 21 (09/06 1214) BP: (136-167)/(49-77) 166/65 (09/06 1214) SpO2:  [96 %-100 %] 99 % (09/06 1214)  Weight change:  Filed Weights   01/23/22 1918 01/26/22 0800 01/26/22 1151  Weight: 70.4 kg 70.9 kg 69.4 kg    Intake/Output: I/O last 3 completed shifts: In: 1078.1 [P.O.:237; Other:30; IV Piggyback:811.1] Out: 270 [Drains:270]   Intake/Output this shift:  Total I/O In: 480 [P.O.:480] Out: 1500 [Other:1500]  Physical Exam: General: NAD  Head: Normocephalic, atraumatic. Moist oral mucosal membranes  Eyes: Anicteric  Lungs:  Clear to auscultation, normal effort, room air  Heart: Regular rate and rhythm  Abdomen:  Soft, tender  Extremities: No peripheral edema.  Neurologic: Nonfocal, moving all four extremities  Skin: No lesions  Access: Right chest PermCath    Basic Metabolic Panel: Recent Labs  Lab 01/24/22 0501 01/25/22 0320 01/26/22 0552 01/27/22 0638 01/28/22 0547  NA 133* 127* 127* 132* 131*  K 4.0 4.1 4.4 4.3 4.5  CL 95* 93* 92* 93* 94*  CO2 '27 23 25 25 25  '$ GLUCOSE 168* 108* 130* 101* 72  BUN 23 31* 41* 23 30*  CREATININE 4.72* 6.19* 7.68* 5.46* 6.91*  CALCIUM 8.2* 8.3* 8.5* 9.1 8.7*  MG 1.8 1.9 2.0 1.8 1.9     Liver Function Tests: No results for input(s):  "AST", "ALT", "ALKPHOS", "BILITOT", "PROT", "ALBUMIN" in the last 168 hours.  No results for input(s): "LIPASE", "AMYLASE" in the last 168 hours. No results for input(s): "AMMONIA" in the last 168 hours.  CBC: Recent Labs  Lab 01/24/22 0501 01/25/22 0320 01/26/22 0552 01/27/22 1610 01/28/22 0547  WBC 20.0* 20.9* 17.0* 24.2* 18.0*  HGB 9.4* 9.8* 9.5* 9.6* 9.4*  HCT 29.6* 31.1* 30.2* 31.0* 29.4*  MCV 85.5 86.4 86.8 85.6 87.0  PLT 335 399 434* 447* 424*     Cardiac Enzymes: No results for input(s): "CKTOTAL", "CKMB", "CKMBINDEX", "TROPONINI" in the last 168 hours.  BNP: Invalid input(s): "POCBNP"  CBG: Recent Labs  Lab 01/27/22 1558 01/27/22 2047 01/28/22 0800 01/28/22 0805 01/28/22 0818  GLUCAP 114* 181* 59* 60* 39     Microbiology: Results for orders placed or performed during the hospital encounter of 01/18/22  Blood Culture (routine x 2)     Status: None   Collection Time: 01/18/22 11:51 AM   Specimen: BLOOD  Result Value Ref Range Status   Specimen Description BLOOD LEFT AC  Final   Special Requests   Final    BOTTLES DRAWN AEROBIC AND ANAEROBIC Blood Culture results may not be optimal due to an excessive volume of blood received in culture bottles   Culture   Final    NO GROWTH 5 DAYS Performed at [name], [address] [place], [place] [address]    Report Status 01/23/2022 FINAL  Final  Resp  Panel by RT-PCR (Flu A&B, Covid) Anterior Nasal Swab     Status: None   Collection Time: 01/18/22 11:51 AM   Specimen: Anterior Nasal Swab  Result Value Ref Range Status   SARS Coronavirus 2 by RT PCR NEGATIVE NEGATIVE Final    Comment: (NOTE) SARS-CoV-2 target nucleic acids are NOT DETECTED.  The SARS-CoV-2 RNA is generally detectable in upper respiratory specimens during the acute phase of infection. The lowest concentration of SARS-CoV-2 viral copies this assay can detect is 138 copies/mL. A negative result does not preclude  SARS-Cov-2 infection and should not be used as the sole basis for treatment or other patient management decisions. A negative result may occur with  improper specimen collection/handling, submission of specimen other than nasopharyngeal swab, presence of viral mutation(s) within the areas targeted by this assay, and inadequate number of viral copies(<138 copies/mL). A negative result must be combined with clinical observations, patient history, and epidemiological information. The expected result is Negative.  Fact Sheet for Patients:  EntrepreneurPulse.com.au  Fact Sheet for Healthcare Providers:  IncredibleEmployment.be  This test is no t yet approved or cleared by the [place] FDA and  has been authorized for detection and/or diagnosis of SARS-CoV-2 by FDA under an Emergency Use Authorization (EUA). This EUA will remain  in effect (meaning this test can be used) for the duration of the COVID-19 declaration under Section 564(b)(1) of the Act, 21 U.S.C.section 360bbb-3(b)(1), unless the authorization is terminated  or revoked sooner.       Influenza A by PCR NEGATIVE NEGATIVE Final   Influenza B by PCR NEGATIVE NEGATIVE Final    Comment: (NOTE) The Xpert Xpress SARS-CoV-2/FLU/RSV plus assay is intended as an aid in the diagnosis of influenza from Nasopharyngeal swab specimens and should not be used as a sole basis for treatment. Nasal washings and aspirates are unacceptable for Xpert Xpress SARS-CoV-2/FLU/RSV testing.  Fact Sheet for Patients: EntrepreneurPulse.com.au  Fact Sheet for Healthcare Providers: IncredibleEmployment.be  This test is not yet approved or cleared by the [place] FDA and has been authorized for detection and/or diagnosis of SARS-CoV-2 by FDA under an Emergency Use Authorization (EUA). This EUA will remain in effect (meaning this test can be used) for the duration of  the COVID-19 declaration under Section 564(b)(1) of the Act, 21 U.S.C. section 360bbb-3(b)(1), unless the authorization is terminated or revoked.  Performed at [facility], [address] [place], [place] [address]   Blood Culture (routine x 2)     Status: None   Collection Time: 01/18/22  2:30 PM   Specimen: BLOOD  Result Value Ref Range Status   Specimen Description BLOOD RIGHT ARM  Final   Special Requests   Final    BOTTLES DRAWN AEROBIC AND ANAEROBIC Blood Culture adequate volume   Culture   Final    NO GROWTH 5 DAYS Performed at [facility], [address] [place], [place] [address]    Report Status 01/27/2022 FINAL  Final  Aerobic/Anaerobic Culture w Gram Stain (surgical/deep wound)     Status: None (Preliminary result)   Collection Time: 01/21/22  3:30 PM   Specimen: Abscess  Result Value Ref Range Status   Specimen Description   Final    ABSCESS GALL BLADDER Performed at [facility], [address] [place], [place] [address]    Special Requests   Final    NONE Performed at [facility], [address], [place],  [address]    Gram Stain  Final    FEW WBC PRESENT,BOTH PMN AND MONONUCLEAR RARE GRAM POSITIVE COCCI IN PAIRS    Culture   Final    FEW ESCHERICHIA COLI MODERATE STREPTOCOCCUS GALLOLYTICUS CULTURE REINCUBATED FOR BETTER GROWTH REISOLATING NO ANAEROBES ISOLATED Performed at [facility], [address] [place], [place] [address]    Report Status PENDING  Incomplete  Carbapenem Resistance Panel     Status: None   Collection Time: 01/21/22  3:30 PM  Result Value Ref Range Status   Carba Resistance IMP Gene NOT DETECTED NOT DETECTED Final   Carba Resistance VIM Gene NOT DETECTED NOT DETECTED Final   Carba Resistance NDM Gene NOT DETECTED NOT DETECTED Final   Carba Resistance KPC Gene NOT DETECTED NOT DETECTED Final   Carba Resistance OXA48 Gene NOT DETECTED NOT DETECTED Final    Comment:  (NOTE) Cepheid Carba-R is an FDA-cleared nucleic acid amplification test  (NAAT)for the detection and differentiation of genes encoding the  most prevalent carbapenemases in bacterial isolate samples. Carbapenemase gene identification and implementation of comprehensive  infection control measures are recommended by the CDC to prevent the  spread of the resistant organisms. Performed at [facility], [place] [address] [place], [place] [address]     Coagulation Studies: No results for input(s): "LABPROT", "INR" in the last 72 hours.   Urinalysis: No results for input(s): "COLORURINE", "LABSPEC", "PHURINE", "GLUCOSEU", "HGBUR", "BILIRUBINUR", "KETONESUR", "PROTEINUR", "UROBILINOGEN", "NITRITE", "LEUKOCYTESUR" in the last 72 hours.  Invalid input(s): "APPERANCEUR"    Imaging: No results found.   Medications:    chlorproMAZINE (THORAZINE) 12.5 mg in sodium chloride 0.9 % 25 mL IVPB 12.5 mg (01/27/22 1726)   methocarbamol (ROBAXIN) IV Stopped (01/27/22 1159)    amLODipine  10 mg Oral Daily   amoxicillin-clavulanate  1 tablet Oral Q12H   aspirin EC  81 mg Oral Daily   Chlorhexidine Gluconate Cloth  6 each Topical Q0600   donepezil  10 mg Oral Daily   feeding supplement (NEPRO CARB STEADY)  237 mL Oral TID BM   gabapentin  300 mg Oral QHS   heparin  5,000 Units Subcutaneous Q8H   hydrALAZINE  100 mg Oral Q8H   insulin aspart  0-15 Units Subcutaneous TID WC   insulin aspart  3 Units Subcutaneous TID WC   insulin detemir  10 Units Subcutaneous QHS   levothyroxine  137 mcg Oral Q0600   loratadine  10 mg Oral QPM   losartan  100 mg Oral QPM   metoprolol succinate  50 mg Oral Daily   pantoprazole  40 mg Oral Daily   simvastatin  20 mg Oral q1800   sodium chloride flush  3 mL Intravenous Q12H   sodium chloride flush  5 mL Intracatheter Q8H   acetaminophen **OR** acetaminophen, albuterol, alum & mag hydroxide-simeth, chlorproMAZINE (THORAZINE) 12.5 mg in sodium chloride 0.9  % 25 mL IVPB, methocarbamol (ROBAXIN) IV  Assessment/ Plan:  [name] is a 76 y.o.  male with a past medical history of hypertension, end-stage renal disease, on hemodialysis on Monday Wednesday Friday schedule, diabetes mellitus, CVA who was brought to the ER with chief complaint of weakness.  [facility]/MWF/right chest PermCath  End-stage renal disease on hemodialysis.    Receiving dialysis today, UF goal 1 to 1.5 L as tolerated.  Next treatment scheduled for Friday.  2. Anemia of chronic kidney disease  Lab Results  Component Value Date   HGB 9.4 (L) 01/28/2022    Hemoglobin remains at  acceptable range.   3. Secondary Hyperparathyroidism: with outpatient labs: PTH 422, phosphorus 6.4, calcium 9.1 on 12/29/2021.   Lab Results  Component Value Date   CALCIUM 8.7 (L) 01/28/2022   CAION 0.83 (LL) 12/10/2020   PHOS 2.3 (L) 01/21/2022    We will continue to monitor bone minerals during this admission.  4.  Hypertension with chronic kidney disease.  Home regimen includes hydralazine and losartan.  Currently receiving these medications along with metoprolol.  Blood pressure 167/61 during dialysis.  5. Diabetes mellitus type II with chronic kidney disease insulin dependent. Home regimen includes NovoLog and Levemir. Most recent hemoglobin A1c is 8.1 on 01/18/2022.  Currently prescribed Januvia outpatient.  Currently held.   6.  Acute cholecystitis seen on CT.  Surgery consulted.  IR placed percutaneous drain on 01/21/22 and stabilizing with medications.  Plan for percutaneous drain for 6 weeks.  Patient will be transition to oral Augmentin.   LOS: [place] 9/6/202312:26 PM

## 2022-01-29 DIAGNOSIS — K810 Acute cholecystitis: Secondary | ICD-10-CM | POA: Diagnosis not present

## 2022-01-29 DIAGNOSIS — A498 Other bacterial infections of unspecified site: Secondary | ICD-10-CM

## 2022-01-29 DIAGNOSIS — A419 Sepsis, unspecified organism: Secondary | ICD-10-CM | POA: Diagnosis not present

## 2022-01-29 DIAGNOSIS — Z1619 Resistance to other specified beta lactam antibiotics: Secondary | ICD-10-CM

## 2022-01-29 LAB — GLUCOSE, CAPILLARY
Glucose-Capillary: 148 mg/dL — ABNORMAL HIGH (ref 70–99)
Glucose-Capillary: 133 mg/dL — ABNORMAL HIGH (ref 70–99)
Glucose-Capillary: 98 mg/dL (ref 70–99)
Glucose-Capillary: 134 mg/dL — ABNORMAL HIGH (ref 70–99)

## 2022-01-29 NOTE — TOC Progression Note (Signed)
Transition of Care [facility]) - Progression Note    Patient Details  Name: Joseph Mcpherson MRN: [id number] Date of Birth: 1946-03-24  [facility]) CM/SW Contact  Laurena Slimmer, RN Phone Number: 01/29/2022, 11:26 AM  Clinical Narrative:    Spoke with patient's daughter, [name]. She does not prefer any facilities that have given bed offers. She would prefer for patient to be discharged home with [facility] with DME.    Expected Discharge Plan: Skilled Nursing Facility Barriers to Discharge: Ship broker, Continued Medical Work up, SNF Pending bed offer  Expected Discharge Plan and Services Expected Discharge Plan: [place] Choice: [place] arrangements for the past 2 months: Single Family Home                                       Social Determinants of Health (SDOH) Interventions    Readmission Risk Interventions     No data to display

## 2022-01-29 NOTE — Progress Notes (Addendum)
Physical Therapy Treatment Patient Details Name: Joseph Mcpherson MRN: [id number] DOB: 1946/04/29 Today's Date: 01/29/2022   History of Present Illness Joseph Mcpherson is a 76 y.o. 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    PT Comments    Pt in bed,  wife in room, ready for session.  OOB with time and supervision.  Stood and is able to walk 100' on unit to nursing station corner and back to room with slow but steady gait.  Limited by fatigue.  Seated arom x 10.  Pt progressing well with mobility and while fatigued he overall did well  stated he got OOB on his own this am and went to bathroom.  He does state he prefers RW at this time for support/stability.    Discussed discharge plan with wife.  Pt is progressing towards goals and mobility.  She is unsure of discharge plan and needs  Stated she felt SNF is still needed due to medication and that her daughter is making decisions.  Will leave recommendations at SNF but if it is not available or medically necessary per MD, HHPT with supports would be reasonable at this time. Will reach out to team.  Addendum: Discussed with MD.  Will adjust to HHPT.    Recommendations for follow up therapy are one component of a multi-disciplinary discharge planning process, led by the attending physician.  Recommendations may be updated based on patient status, additional functional criteria and insurance authorization.  Follow Up Recommendations  HHPT     Assistance Recommended at Discharge Intermittent Supervision/Assistance  Patient can return home with the following A little help with walking and/or transfers;A little help with bathing/dressing/bathroom;Assistance with cooking/housework;Help with stairs or ramp for entrance;Assist for transportation   Equipment Recommendations  Rolling walker (2 wheels);BSC/3in1    Recommendations for Other  Services       Precautions / Restrictions Restrictions Weight Bearing Restrictions: No     Mobility  Bed Mobility Overal bed mobility: Needs Assistance Bed Mobility: Supine to Sit     Supine to sit: Supervision     General bed mobility comments: time but no assist    Transfers Overall transfer level: Needs assistance Equipment used: Rolling walker (2 wheels) Transfers: Sit to/from Stand Sit to Stand: Supervision, Min guard                Ambulation/Gait Ambulation/Gait assistance: Min guard Gait Distance (Feet): 100 Feet Assistive device: Rolling walker (2 wheels) Gait Pattern/deviations: Step-to pattern, Decreased step length - right, Decreased step length - left, Drifts right/left, Trunk flexed, Narrow base of support Gait velocity: dec     General Gait Details: slow but steady   Marine scientist Rankin (Stroke Patients Only)       Balance Overall balance assessment: Needs assistance Sitting-balance support: Feet supported, Bilateral upper extremity supported, No upper extremity supported Sitting balance-Leahy Scale: Good     Standing balance support: Bilateral upper extremity supported Standing balance-Leahy Scale: Fair Standing balance comment: no knees buckling today                            Cognition Arousal/Alertness: Awake/alert Behavior During Therapy: WFL for tasks assessed/performed Overall Cognitive Status: Within Functional Limits for tasks assessed  General Comments: quiet and a bit [name]        Exercises Other Exercises Other Exercises: seated AROM x 10    General Comments        Pertinent Vitals/Pain Pain Assessment Pain Assessment: No/denies pain    Home Living                          Prior Function            PT Goals (current goals can now be found in the care plan section) Progress towards PT goals:  Progressing toward goals    Frequency    Min 2X/week      PT Plan Discharge plan needs to be updated    Co-evaluation              AM-PAC PT "6 Clicks" Mobility   Outcome Measure  Help needed turning from your back to your side while in a flat bed without using bedrails?: None Help needed moving from lying on your back to sitting on the side of a flat bed without using bedrails?: None Help needed moving to and from a bed to a chair (including a wheelchair)?: A Little Help needed standing up from a chair using your arms (e.g., wheelchair or bedside chair)?: A Little Help needed to walk in hospital room?: A Little Help needed climbing 3-5 steps with a railing? : A Little 6 Click Score: 20    End of Session Equipment Utilized During Treatment: Gait belt Activity Tolerance: Patient tolerated treatment well Patient left: in chair;with call bell/phone within reach;with chair alarm set;with family/visitor present Nurse Communication: Mobility status PT Visit Diagnosis: Unsteadiness on feet (R26.81);Muscle weakness (generalized) (M62.81)     Time: 6578-4696 PT Time Calculation (min) (ACUTE ONLY): 15 min  Charges:  $Gait Training: 8-22 mins                   Chesley Noon, PTA 01/29/22, 10:46 AM

## 2022-01-29 NOTE — Progress Notes (Signed)
   Date of Admission:  01/18/2022   ID: Joseph Mcpherson is a 76 y.o. male Principal Problem:   Sepsis ([place]) Active Problems:   Essential hypertension   Mixed hyperlipidemia   Insulin dependent type 2 diabetes mellitus (HCC)   Hypothyroidism   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[place])   ESRD (end stage renal disease) on dialysis ([place])   Anemia in chronic kidney disease   History of CVA (cerebrovascular accident)   Hyperkalemia   Generalized weakness   Hyponatremia   Cholecystitis, acute    Subjective: No specific complaints Sitting in chair Medications:   amLODipine  10 mg Oral Daily   aspirin EC  81 mg Oral Daily   Chlorhexidine Gluconate Cloth  6 each Topical Q0600   donepezil  10 mg Oral Daily   feeding supplement (NEPRO CARB STEADY)  237 mL Oral TID BM   gabapentin  300 mg Oral QHS   heparin  5,000 Units Subcutaneous Q8H   hydrALAZINE  100 mg Oral Q8H   insulin aspart  0-15 Units Subcutaneous TID WC   insulin aspart  3 Units Subcutaneous TID WC   insulin detemir  10 Units Subcutaneous QHS   levothyroxine  137 mcg Oral Q0600   loratadine  10 mg Oral QPM   losartan  100 mg Oral QPM   metoprolol succinate  50 mg Oral Daily   metroNIDAZOLE  500 mg Oral Q12H   pantoprazole  40 mg Oral Daily   simvastatin  20 mg Oral q1800   sodium chloride flush  3 mL Intravenous Q12H   sodium chloride flush  5 mL Intracatheter Q8H    Objective: Vital signs in last 24 hours: Temp:  [97.9 F (36.6 C)-98.6 F (37 C)] 98.1 F (36.7 C) (09/07 0824) Pulse Rate:  [65-75] 66 (09/07 0824) Resp:  [18-20] 18 (09/07 0824) BP: (149-162)/(50-59) 153/53 (09/07 0824) SpO2:  [99 %-100 %] 100 % (09/07 0824)    PHYSICAL EXAM:  General: Alert, cooperative, no distress, appears stated age.  Lungs: Clear to auscultation bilaterally. No Wheezing or Rhonchi. No rales. Heart: Regular rate and rhythm, no murmur, rub or gallop. Abdomen: Soft, rt upper quadrant drain Neurologic: Grossly  non-focal  Lab Results Recent Labs    01/27/22 0638 01/28/22 0547  WBC 24.2* 18.0*  HGB 9.6* 9.4*  HCT 31.0* 29.4*  NA 132* 131*  K 4.3 4.5  CL 93* 94*  CO2 25 25  BUN 23 30*  CREATININE 5.46* 6.91*   Microbiology: CRE e.coli Studies/Results: No results found.   Assessment/Plan: ?Acute cholecystitis presenting with fever and latered mental status , but no abdominal symptoms Diagnosed by CT S/p percutaneous cholecystostomy Carbepenem resistant e. 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

## 2022-01-29 NOTE — Plan of Care (Signed)

## 2022-01-29 NOTE — Progress Notes (Signed)
[facility] [place] NAME: Joseph Mcpherson    MR#:  [id number]  DATE OF BIRTH:  1945/07/05  SUBJECTIVE:  no family at bedside in the morning during my evaluation. Patient awake. Tells me he walks to the bathroom. Eating small amounts. Appetite not the best. Per RN ambulating with walker to the bathroom. Getting IV antibiotics.  VITALS:  Blood pressure (!) 153/53, pulse 66, temperature 98.1 F (36.7 C), resp. rate 18, height '5\' 4"'$  (1.626 m), weight 69.4 kg, SpO2 100 %.  PHYSICAL EXAMINATION:   GENERAL:  76 y.o.-year-old patient lying in the bed with no acute distress.  LUNGS: Normal breath sounds bilaterally, no wheezing, rales, rhonchi.  CARDIOVASCULAR: S1, S2 normal. No murmurs, rubs, or gallops.  ABDOMEN: Soft, nontender, nondistended. Bowel sounds present. Gallbladder drain + EXTREMITIES: No  edema b/l.    NEUROLOGIC: nonfocal  patient is alert and awake SKIN: per RN  LABORATORY PANEL:  CBC Recent Labs  Lab 01/28/22 0547  WBC 18.0*  HGB 9.4*  HCT 29.4*  PLT 424*     Chemistries  Recent Labs  Lab 01/28/22 0547  NA 131*  K 4.5  CL 94*  CO2 25  GLUCOSE 72  BUN 30*  CREATININE 6.91*  CALCIUM 8.7*  MG 1.9     Assessment and Plan  Joseph Mcpherson is a 76 y.o. 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     Acute cholecystitis S/p chole tube on 8/30 --dx by CT c/a/p.   --GenSurg consulted, rec chole tube --started on vanc/cefepime on presentation, then switched to ceftriaxone and flagyl on 8/30 --CT a/p w contrast 9/2 showed no abscess formation or other acute finding other than cholecystitis --cont ceftriaxone and flagyl while inpatient--changed to Po augmentin --chole tube care and flushes --outpatient surgical f/u in 3-4 weeks --Percutaneous cholecystostomy drain to remain in place at least 6 weeks. Recommend  fluoroscopy with injection of the drain in IR to evaluate for patency of the cystic duct. --GB Fluid culture DREcli and Strepto gallolyticus --ID consult to help with abxs   Leukocytosis  --repeat CT a/p w contrast 9/2 showed no abscess formation or other acute finding other than cholecystitis --WBC 34K--18K   Sepsis --On admission patient noted to be febrile up to 101.4 F with mild tachypnea, and labs significant for WBC 34.6 with lactic acid reassuring at 1.  Source acute cholecystitis --sepsis improving   Acute metabolic encephalopathy, improved --on presentation, somnolent, altered, due to infection.  Started to become more alert  now.   Hyperkalemia due to missing HD --  K 4.5   ESRD on HD MWF Patient normally dialyzes Monday, Wednesday, Friday -HD per nephrology   Weakness Patient noted to be grossly weak needing assistance to ambulate. - CT scan of the brain negative   --PT/OT rec SNF rehab--doing better--HHPT   Poor oral intake --started PTA, but wife said pt eats little at baseline --[place] TID   Essential hypertension --BP has been elevated on home BP meds --cont Toprol,hydralazine and losartan   Hyponatremia, acute --Suspect patient likely at least partially fluid overloaded recent missed dialysis. --monitor Na    Anemia chronic kidney disease  --hgb 9.4  Alzheimer's disease -Delirium precautions -Continue donepezil   Insulin-dependent diabetes mellitus type 2, uncontrolled With Hyperglycemia --A1c 8.1. --Home medication regimen includes Januvia 50 mg daily, NovoLog 10 units subcu  nightly, and Levemir 20 units nightly. --hypoglycemic on 8/30 due to NPO --cont Levemir 12u nightly --SSI mod scale --cont mealtime 3u TID   Hypothyroidism -Continue levothyroxine   Mixed hyperlipidemia -Continue statin   History of CVA -Continue statin and aspirin   GERD -Continue Protonix     DVT prophylaxis: Heparin SQ Code Status: Full code  Family  Communication: Spoke with dter Joseph Mcpherson on the phone 9/7  Level of care: Med-Surg Status is: Inpatient Remains inpatient appropriate because: ID consult pending regarding further additional antibiotic need--and duration Patient per PT ok with HHPT. Will d/c home once IV abx regimen is decided   TOTAL TIME TAKING CARE OF THIS PATIENT: 35 minutes.  >50% time spent on counselling and coordination of care  Note: This dictation was prepared with Dragon dictation along with smaller phrase technology. Any transcriptional errors that result from this process are unintentional.  Fritzi Mandes M.D    [facility]   CC: Primary care physician; [name], MD

## 2022-01-29 NOTE — Care Management Important Message (Signed)
Important Message  Patient Details  Name: Joseph Mcpherson MRN: [id number] Date of Birth: 1945-10-14   Medicare Important Message Given:  Yes     [name] A Joseph Mcpherson 01/29/2022, 1:52 PM

## 2022-01-29 NOTE — Progress Notes (Signed)
[facility]  ROUNDING NOTE   Subjective:   Patient seen resting quietly in bed Alert and oriented Appetite poor but attempting to eat small meals Denies nausea Remains on room air   Objective:  Vital signs in last 24 hours:  Temp:  [97.9 F (36.6 C)-98.8 F (37.1 C)] 98.1 F (36.7 C) (09/07 0824) Pulse Rate:  [65-83] 66 (09/07 0824) Resp:  [17-21] 18 (09/07 0824) BP: (149-167)/(50-77) 153/53 (09/07 0824) SpO2:  [99 %-100 %] 100 % (09/07 0824)  Weight change:  Filed Weights   01/23/22 1918 01/26/22 0800 01/26/22 1151  Weight: 70.4 kg 70.9 kg 69.4 kg    Intake/Output: I/O last 3 completed shifts: In: 709.3 [P.O.:480; Other:20; IV Piggyback:209.3] Out: 2778 [Drains:270; Other:1500]   Intake/Output this shift:  No intake/output data recorded.  Physical Exam: General: NAD  Head: Normocephalic, atraumatic. Moist oral mucosal membranes  Eyes: Anicteric  Lungs:  Clear to auscultation, normal effort, room air  Heart: Regular rate and rhythm  Abdomen:  Soft, tender  Extremities: No peripheral edema.  Neurologic: Nonfocal, moving all four extremities  Skin: No lesions  Access: Right chest PermCath    Basic Metabolic Panel: Recent Labs  Lab 01/24/22 0501 01/25/22 0320 01/26/22 0552 01/27/22 0638 01/28/22 0547  NA 133* 127* 127* 132* 131*  K 4.0 4.1 4.4 4.3 4.5  CL 95* 93* 92* 93* 94*  CO2 '27 23 25 25 25  '$ GLUCOSE 168* 108* 130* 101* 72  BUN 23 31* 41* 23 30*  CREATININE 4.72* 6.19* 7.68* 5.46* 6.91*  CALCIUM 8.2* 8.3* 8.5* 9.1 8.7*  MG 1.8 1.9 2.0 1.8 1.9     Liver Function Tests: No results for input(s): "AST", "ALT", "ALKPHOS", "BILITOT", "PROT", "ALBUMIN" in the last 168 hours.  No results for input(s): "LIPASE", "AMYLASE" in the last 168 hours. No results for input(s): "AMMONIA" in the last 168 hours.  CBC: Recent Labs  Lab 01/24/22 0501 01/25/22 0320 01/26/22 0552 01/27/22 2423 01/28/22 0547  WBC 20.0* 20.9* 17.0* 24.2* 18.0*   HGB 9.4* 9.8* 9.5* 9.6* 9.4*  HCT 29.6* 31.1* 30.2* 31.0* 29.4*  MCV 85.5 86.4 86.8 85.6 87.0  PLT 335 399 434* 447* 424*     Cardiac Enzymes: No results for input(s): "CKTOTAL", "CKMB", "CKMBINDEX", "TROPONINI" in the last 168 hours.  BNP: Invalid input(s): "POCBNP"  CBG: Recent Labs  Lab 01/28/22 0818 01/28/22 1305 01/28/22 1615 01/28/22 2031 01/29/22 0820  GLUCAP 77 163* 146* 154* 148*     Microbiology: Results for orders placed or performed during the hospital encounter of 01/18/22  Blood Culture (routine x 2)     Status: None   Collection Time: 01/18/22 11:51 AM   Specimen: BLOOD  Result Value Ref Range Status   Specimen Description BLOOD LEFT AC  Final   Special Requests   Final    BOTTLES DRAWN AEROBIC AND ANAEROBIC Blood Culture results may not be optimal due to an excessive volume of blood received in culture bottles   Culture   Final    NO GROWTH 5 DAYS Performed at [facility], [address] [place], [place] [address]    Report Status 01/23/2022 FINAL  Final  Resp Panel by RT-PCR (Flu A&B, Covid) Anterior Nasal Swab     Status: None   Collection Time: 01/18/22 11:51 AM   Specimen: Anterior Nasal Swab  Result Value Ref Range Status   SARS Coronavirus 2 by RT PCR NEGATIVE NEGATIVE Final    Comment: (NOTE) SARS-CoV-2 target nucleic acids  are NOT DETECTED.  The SARS-CoV-2 RNA is generally detectable in upper respiratory specimens during the acute phase of infection. The lowest concentration of SARS-CoV-2 viral copies this assay can detect is 138 copies/mL. A negative result does not preclude SARS-Cov-2 infection and should not be used as the sole basis for treatment or other patient management decisions. A negative result may occur with  improper specimen collection/handling, submission of specimen other than nasopharyngeal swab, presence of viral mutation(s) within the areas targeted by this assay, and inadequate number of  viral copies(<138 copies/mL). A negative result must be combined with clinical observations, patient history, and epidemiological information. The expected result is Negative.  Fact Sheet for Patients:  EntrepreneurPulse.com.au  Fact Sheet for Healthcare Providers:  IncredibleEmployment.be  This test is no t yet approved or cleared by the [place] FDA and  has been authorized for detection and/or diagnosis of SARS-CoV-2 by FDA under an Emergency Use Authorization (EUA). This EUA will remain  in effect (meaning this test can be used) for the duration of the COVID-19 declaration under Section 564(b)(1) of the Act, 21 U.S.C.section 360bbb-3(b)(1), unless the authorization is terminated  or revoked sooner.       Influenza A by PCR NEGATIVE NEGATIVE Final   Influenza B by PCR NEGATIVE NEGATIVE Final    Comment: (NOTE) The Xpert Xpress SARS-CoV-2/FLU/RSV plus assay is intended as an aid in the diagnosis of influenza from Nasopharyngeal swab specimens and should not be used as a sole basis for treatment. Nasal washings and aspirates are unacceptable for Xpert Xpress SARS-CoV-2/FLU/RSV testing.  Fact Sheet for Patients: EntrepreneurPulse.com.au  Fact Sheet for Healthcare Providers: IncredibleEmployment.be  This test is not yet approved or cleared by the [place] FDA and has been authorized for detection and/or diagnosis of SARS-CoV-2 by FDA under an Emergency Use Authorization (EUA). This EUA will remain in effect (meaning this test can be used) for the duration of the COVID-19 declaration under Section 564(b)(1) of the Act, 21 U.S.C. section 360bbb-3(b)(1), unless the authorization is terminated or revoked.  Performed at [facility] [address] [place], [place] [address]   Blood Culture (routine x 2)     Status: None   Collection Time: 01/18/22  2:30 PM   Specimen: BLOOD  Result  Value Ref Range Status   Specimen Description BLOOD RIGHT ARM  Final   Special Requests   Final    BOTTLES DRAWN AEROBIC AND ANAEROBIC Blood Culture adequate volume   Culture   Final    NO GROWTH 5 DAYS Performed at [facility], [address], [place], [place] [address]    Report Status 01/27/2022 FINAL  Final  Aerobic/Anaerobic Culture w Gram Stain (surgical/deep wound)     Status: None (Preliminary result)   Collection Time: 01/21/22  3:30 PM   Specimen: Abscess  Result Value Ref Range Status   Specimen Description   Final    ABSCESS GALL BLADDER Performed at [facility], [address], [place], [place] [address]    Special Requests   Final    NONE Performed at [facility], [address], [place], [place] [address]    Gram Stain   Final    FEW WBC PRESENT,BOTH PMN AND MONONUCLEAR RARE GRAM POSITIVE COCCI IN PAIRS    Culture   Final    FEW ESCHERICHIA COLI MULTI-DRUG RESISTANT ORGANISM CRITICAL RESULT CALLED TO, READ BACK BY AND VERIFIED WITH: PHARMD K. Kirn [id number] [id number] FCP MODERATE STREPTOCOCCUS GALLOLYTICUS NO ANAEROBES  ISOLATED Performed at [facility], [place] [address], [place], [place] [address]    Report Status PENDING  Incomplete   Organism ID, Bacteria ESCHERICHIA COLI  Final   Organism ID, Bacteria STREPTOCOCCUS GALLOLYTICUS  Final      Susceptibility   Escherichia coli - MIC*    AMPICILLIN >=32 RESISTANT Resistant     CEFAZOLIN >=64 RESISTANT Resistant     CEFEPIME 4 INTERMEDIATE Intermediate     CEFTAZIDIME >=64 RESISTANT Resistant     CEFTRIAXONE >=64 RESISTANT Resistant     CIPROFLOXACIN >=4 RESISTANT Resistant     GENTAMICIN <=1 SENSITIVE Sensitive     IMIPENEM RESISTANT Resistant     TRIMETH/SULFA <=20 SENSITIVE Sensitive     AMPICILLIN/SULBACTAM >=32 RESISTANT Resistant     PIP/TAZO 64 INTERMEDIATE Intermediate     * FEW ESCHERICHIA COLI   Streptococcus gallolyticus - MIC*    PENICILLIN 0.12 SENSITIVE  Sensitive     CEFTRIAXONE <=0.12 SENSITIVE Sensitive     ERYTHROMYCIN >=8 RESISTANT Resistant     LEVOFLOXACIN >=16 RESISTANT Resistant     VANCOMYCIN 0.25 SENSITIVE Sensitive     * MODERATE STREPTOCOCCUS GALLOLYTICUS  Carbapenem Resistance Panel     Status: None   Collection Time: 01/21/22  3:30 PM  Result Value Ref Range Status   Carba Resistance IMP Gene NOT DETECTED NOT DETECTED Final   Carba Resistance VIM Gene NOT DETECTED NOT DETECTED Final   Carba Resistance NDM Gene NOT DETECTED NOT DETECTED Final   Carba Resistance KPC Gene NOT DETECTED NOT DETECTED Final   Carba Resistance OXA48 Gene NOT DETECTED NOT DETECTED Final    Comment: (NOTE) Cepheid Carba-R is an FDA-cleared nucleic acid amplification test  (NAAT)for the detection and differentiation of genes encoding the  most prevalent carbapenemases in bacterial isolate samples. Carbapenemase gene identification and implementation of comprehensive  infection control measures are recommended by the CDC to prevent the  spread of the resistant organisms. Performed at [facility], [place] [address], [place], [place] [address]     Coagulation Studies: No results for input(s): "LABPROT", "INR" in the last 72 hours.   Urinalysis: No results for input(s): "COLORURINE", "LABSPEC", "PHURINE", "GLUCOSEU", "HGBUR", "BILIRUBINUR", "KETONESUR", "PROTEINUR", "UROBILINOGEN", "NITRITE", "LEUKOCYTESUR" in the last 72 hours.  Invalid input(s): "APPERANCEUR"    Imaging: No results found.   Medications:    ceftazidime-avibactam (AVYCAZ) 0.94 g in dextrose 5 % 50 mL IVPB 25 mL/hr at 01/28/22 1748   chlorproMAZINE (THORAZINE) 12.5 mg in sodium chloride 0.9 % 25 mL IVPB Stopped (01/27/22 1815)   methocarbamol (ROBAXIN) IV 500 mg (01/28/22 2224)    amLODipine  10 mg Oral Daily   aspirin EC  81 mg Oral Daily   Chlorhexidine Gluconate Cloth  6 each Topical Q0600   donepezil  10 mg Oral Daily   feeding supplement (NEPRO CARB STEADY)   237 mL Oral TID BM   gabapentin  300 mg Oral QHS   heparin  5,000 Units Subcutaneous Q8H   hydrALAZINE  100 mg Oral Q8H   insulin aspart  0-15 Units Subcutaneous TID WC   insulin aspart  3 Units Subcutaneous TID WC   insulin detemir  10 Units Subcutaneous QHS   levothyroxine  137 mcg Oral Q0600   loratadine  10 mg Oral QPM   losartan  100 mg Oral QPM   metoprolol succinate  50 mg Oral Daily   metroNIDAZOLE  500 mg Oral Q12H   pantoprazole  40 mg Oral Daily  simvastatin  20 mg Oral q1800   sodium chloride flush  3 mL Intravenous Q12H   sodium chloride flush  5 mL Intracatheter Q8H   acetaminophen **OR** acetaminophen, albuterol, alum & mag hydroxide-simeth, chlorproMAZINE (THORAZINE) 12.5 mg in sodium chloride 0.9 % 25 mL IVPB, methocarbamol (ROBAXIN) IV  Assessment/ Plan:  [name] is a 76 y.o.  male with a past medical history of hypertension, end-stage renal disease, on hemodialysis on Monday Wednesday Friday schedule, diabetes mellitus, CVA who was brought to the ER with chief complaint of weakness.  CK [facility]/MWF/right chest PermCath  End-stage renal disease on hemodialysis.    Received dialysis yesterday, UF 1.5L achieved. Next treatment scheduled for Friday.  2. Anemia of chronic kidney disease  Lab Results  Component Value Date   HGB 9.4 (L) 01/28/2022    Will continue to monitor and assess need for ESA.   3. Secondary Hyperparathyroidism: with outpatient labs: PTH 422, phosphorus 6.4, calcium 9.1 on 12/29/2021.   Lab Results  Component Value Date   CALCIUM 8.7 (L) 01/28/2022   CAION 0.83 (LL) 12/10/2020   PHOS 2.3 (L) 01/21/2022    Will continue to monitor bone minerals during this admission.   4.  Hypertension with chronic kidney disease.  Home regimen includes hydralazine and losartan.  Currently receiving these medications along with metoprolol.  Blood pressure stable for this patient.  5. Diabetes mellitus type II with chronic kidney disease  insulin dependent. Home regimen includes NovoLog and Levemir. Most recent hemoglobin A1c is 8.1 on 01/18/2022.  Currently prescribed Januvia outpatient.  Currently held.   6.  Acute cholecystitis seen on CT.  Surgery consulted.  IR placed percutaneous drain on 01/21/22 and stabilizing with medications.  Plan for percutaneous drain for 6 weeks.  Was receiving Augmentin. Now receiving Flagyl and Avycaz.    LOS: 11 [place] 9/7/202311:32 AM

## 2022-01-30 ENCOUNTER — Encounter: Payer: Self-pay | Admitting: Internal Medicine

## 2022-01-30 DIAGNOSIS — R509 Fever, unspecified: Secondary | ICD-10-CM | POA: Diagnosis not present

## 2022-01-30 DIAGNOSIS — K810 Acute cholecystitis: Secondary | ICD-10-CM | POA: Diagnosis not present

## 2022-01-30 DIAGNOSIS — N186 End stage renal disease: Secondary | ICD-10-CM | POA: Diagnosis not present

## 2022-01-30 DIAGNOSIS — Z992 Dependence on renal dialysis: Secondary | ICD-10-CM | POA: Diagnosis not present

## 2022-01-30 LAB — GLUCOSE, CAPILLARY
Glucose-Capillary: 220 mg/dL — ABNORMAL HIGH (ref 70–99)
Glucose-Capillary: 234 mg/dL — ABNORMAL HIGH (ref 70–99)
Glucose-Capillary: 149 mg/dL — ABNORMAL HIGH (ref 70–99)

## 2022-01-30 LAB — RENAL FUNCTION PANEL
Albumin: 2.9 g/dL — ABNORMAL LOW (ref 3.5–5.0)
Anion gap: 13 (ref 5–15)
BUN: 22 mg/dL (ref 8–23)
CO2: 24 mmol/L (ref 22–32)
Calcium: 9.1 mg/dL (ref 8.9–10.3)
Chloride: 96 mmol/L — ABNORMAL LOW (ref 98–111)
Creatinine, Ser: 7 mg/dL — ABNORMAL HIGH (ref 0.61–1.24)
GFR, Estimated: 8 mL/min — ABNORMAL LOW (ref >60)
Glucose, Bld: 179 mg/dL — ABNORMAL HIGH (ref 70–99)
Phosphorus: 4.3 mg/dL (ref 2.5–4.6)
Potassium: 4.6 mmol/L (ref 3.5–5.1)
Sodium: 133 mmol/L — ABNORMAL LOW (ref 135–145)

## 2022-01-30 LAB — CBC WITH DIFFERENTIAL/PLATELET
Abs Immature Granulocytes: 0.25 10*3/uL — ABNORMAL HIGH (ref 0.00–0.07)
Basophils Absolute: 0.1 10*3/uL (ref 0.0–0.1)
Basophils Relative: 1 %
Eosinophils Absolute: 0.2 10*3/uL (ref 0.0–0.5)
Eosinophils Relative: 1 %
HCT: 30.9 % — ABNORMAL LOW (ref 39.0–52.0)
Hemoglobin: 9.6 g/dL — ABNORMAL LOW (ref 13.0–17.0)
Immature Granulocytes: 1 %
Lymphocytes Relative: 8 %
Lymphs Abs: 1.4 10*3/uL (ref 0.7–4.0)
MCH: 27.3 pg (ref 26.0–34.0)
MCHC: 31.1 g/dL (ref 30.0–36.0)
MCV: 87.8 fL (ref 80.0–100.0)
Monocytes Absolute: 1 10*3/uL (ref 0.1–1.0)
Monocytes Relative: 6 %
Neutro Abs: 14.5 10*3/uL — ABNORMAL HIGH (ref 1.7–7.7)
Neutrophils Relative %: 83 %
Platelets: 440 10*3/uL — ABNORMAL HIGH (ref 150–400)
RBC: 3.52 MIL/uL — ABNORMAL LOW (ref 4.22–5.81)
RDW: 17.2 % — ABNORMAL HIGH (ref 11.5–15.5)
WBC: 17.4 10*3/uL — ABNORMAL HIGH (ref 4.0–10.5)
nRBC: 0 % (ref 0.0–0.2)

## 2022-01-30 LAB — CBC
HCT: 34.4 % — ABNORMAL LOW (ref 39.0–52.0)
Hemoglobin: 10.2 g/dL — ABNORMAL LOW (ref 13.0–17.0)
MCH: 26.8 pg (ref 26.0–34.0)
MCHC: 29.7 g/dL — ABNORMAL LOW (ref 30.0–36.0)
MCV: 90.3 fL (ref 80.0–100.0)
Platelets: 524 10*3/uL — ABNORMAL HIGH (ref 150–400)
RBC: 3.81 MIL/uL — ABNORMAL LOW (ref 4.22–5.81)
RDW: 17.5 % — ABNORMAL HIGH (ref 11.5–15.5)
WBC: 18.4 10*3/uL — ABNORMAL HIGH (ref 4.0–10.5)
nRBC: 0 % (ref 0.0–0.2)

## 2022-01-30 NOTE — Progress Notes (Signed)
Physical Therapy Treatment Patient Details Name: Joseph Mcpherson MRN: [id number] DOB: 01/20/1946 Today's Date: 01/30/2022   History of Present Illness Joseph Mcpherson is a 76 y.o. 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    PT Comments    Pt seen for PT tx with pt agreeable despite fatigue. Pt requires encouragement & extra time to initiate movements, requires min assist holding to PT's hand for supine>sit. Pt is able to complete STS with supervision & cuing for safe hand placement & ambulate into hallway & back with RW & supervision with slow, steady gait. Pt would benefit from practicing stair negotiation as he has 2-3 steps with B rails to enter his home but is too fatigued today. Will continue to follow pt acutely to address balance, strengthening, and gait & stairs with LRAD.    Recommendations for follow up therapy are one component of a multi-disciplinary discharge planning process, led by the attending physician.  Recommendations may be updated based on patient status, additional functional criteria and insurance authorization.  Follow Up Recommendations  Home health PT Can patient physically be transported by private vehicle: Yes   Assistance Recommended at Discharge Intermittent Supervision/Assistance  Patient can return home with the following A little help with walking and/or transfers;A little help with bathing/dressing/bathroom;Assistance with cooking/housework;Help with stairs or ramp for entrance;Assist for transportation   Equipment Recommendations  Rolling walker (2 wheels);BSC/3in1    Recommendations for Other Services       Precautions / Restrictions Precautions Precautions: Fall Restrictions Weight Bearing Restrictions: No     Mobility  Bed Mobility Overal bed mobility: Needs Assistance Bed Mobility: Supine to Sit     Supine to sit: Min assist, HOB  elevated (holds to PT's hand for supine>sit, HOB slightly elevated) Sit to supine: Supervision (extra time to elevate BLE onto bed, HOB slightly elevated)        Transfers Overall transfer level: Needs assistance Equipment used: Rolling walker (2 wheels) Transfers: Sit to/from Stand Sit to Stand: Supervision           General transfer comment: cuing for hand placement to push to standing    Ambulation/Gait Ambulation/Gait assistance: Supervision Gait Distance (Feet): 110 Feet Assistive device: Rolling walker (2 wheels) Gait Pattern/deviations: Decreased stride length, Decreased step length - right, Decreased step length - left Gait velocity: decreased     General Gait Details: slow but steady   Marine scientist [place] (Stroke Patients Only)       Balance Overall balance assessment: Needs assistance Sitting-balance support: Feet supported Sitting balance-Leahy Scale: Good Sitting balance - Comments: supervision static sitting EOB   Standing balance support: Bilateral upper extremity supported, During functional activity Standing balance-Leahy Scale: Fair                              Cognition Arousal/Alertness: Awake/alert Behavior During Therapy: WFL for tasks assessed/performed Overall Cognitive Status: Within Functional Limits for tasks assessed                                          Exercises      General Comments  Pertinent Vitals/Pain Pain Assessment Pain Assessment: Faces Faces Pain Scale: Hurts a little bit Pain Location: abdomen with hiccups Pain Descriptors / Indicators: Grimacing, Guarding Pain Intervention(s): Monitored during session, Limited activity within patient's tolerance    Home Living                          Prior Function            PT Goals (current goals can now be found in the care plan section) Acute Rehab PT Goals Patient  Stated Goal: go to [place] for his vacation PT Goal Formulation: With patient Time For Goal Achievement: 02/02/22 Potential to Achieve Goals: Poor Progress towards PT goals: Progressing toward goals    Frequency    Min 2X/week      PT Plan Current plan remains appropriate    Co-evaluation              AM-PAC PT "6 Clicks" Mobility   Outcome Measure  Help needed turning from your back to your side while in a flat bed without using bedrails?: None Help needed moving from lying on your back to sitting on the side of a flat bed without using bedrails?: A Little Help needed moving to and from a bed to a chair (including a wheelchair)?: A Little Help needed standing up from a chair using your arms (e.g., wheelchair or bedside chair)?: A Little Help needed to walk in hospital room?: A Little Help needed climbing 3-5 steps with a railing? : A Little 6 Click Score: 19    End of Session   Activity Tolerance: Patient tolerated treatment well;Patient limited by fatigue Patient left: in bed;with call bell/phone within reach;with bed alarm set;with family/visitor present   PT Visit Diagnosis: Unsteadiness on feet (R26.81);Muscle weakness (generalized) (M62.81)     Time: 1610-9604 PT Time Calculation (min) (ACUTE ONLY): 9 min  Charges:  $Therapeutic Activity: 8-22 mins                     Lavone Nian, PT, DPT 01/30/22, 3:10 PM   Waunita Schooner 01/30/2022, 3:09 PM

## 2022-01-30 NOTE — TOC Progression Note (Addendum)
Transition of Care [facility]) - Progression Note    Patient Details  Name: Joseph Mcpherson MRN: [id number] Date of Birth: 24-Nov-1945  Transition of Care [facility]) CM/SW [place], LCSW Phone Number: 01/30/2022, 10:17 AM  Clinical Narrative:    Patient to DC home Monday after completing IV medications per MD. CSW spoke with daughter Joseph Mcpherson via phone. Joseph Mcpherson stated she has reached out to [employer] and is working on setting up dialysis transportation through agency called [employer].  Joseph Mcpherson requests EMS transportation home when patient is DC.  Joseph Mcpherson states she would prefer if patient can be seen daily this weekend by PT/OT while in the hospital, relayed request to MD.  Joseph Mcpherson confirmed they would like [place], she will send list to CSW of agencies they are interested in, Joseph Mcpherson stated she would like RN for dressing changes and to check patient's white blood cell count, PT, and Aide services for [name]. CSW explained insurance typically does not cover daily [place] visits, Joseph Mcpherson verbalized understanding and stated she will private pay for services through an agency of her choosing on the days HH does not come out as she wants patient to be seen daily.  Joseph Mcpherson requested a wheelchair, rolling walker, and bedside commode. Referral made to [facility] with [employer] and notified her of plan for DC Monday.    10:30- Referral accepted by [facility] with Joseph Mcpherson. He stated MD will need to order [facility] labs if needed.  Spoke to HD Coordinator Joseph Mcpherson and informed her family is setting up [place] for dialysis and they need a standing order per daughter Joseph Mcpherson.    11:07- Spoke to Joseph Mcpherson who stated she got the form needed for the [place].  Per [employer], they already gave patient a RW on 8/30. Notified Joseph Mcpherson.    11:55- VM from Joseph Mcpherson asking about getting handicap stickers for cars. Per MD, they must follow up with the PCP on this, notified Joseph Mcpherson.   Expected Discharge Plan: Skilled Nursing  Facility Barriers to Discharge: Ship broker, Continued Medical Work up, SNF Pending bed offer  Expected Discharge Plan and Services Expected Discharge Plan: [place] Choice: [place] arrangements for the past 2 months: Single Family Home                                       Social Determinants of Health (SDOH) Interventions    Readmission Risk Interventions     No data to display

## 2022-01-30 NOTE — Progress Notes (Signed)
[facility]  ROUNDING NOTE   Subjective:   Patient seen and evaluated during dialysis   HEMODIALYSIS FLOWSHEET:  Blood Flow Rate (mL/min): 400 mL/min Arterial Pressure (mmHg): -210 mmHg Venous Pressure (mmHg): 200 mmHg TMP (mmHg): 2 mmHg Ultrafiltration Rate (mL/min): 595 mL/min Dialysate Flow Rate (mL/min): 300 ml/min Dialysis Fluid Bolus: Normal Saline Bolus Amount (mL): 300 mL  Tolerating treatment well.    Objective:  Vital signs in last 24 hours:  Temp:  [97.7 F (36.5 C)-98.6 F (37 C)] 98.4 F (36.9 C) (09/08 1202) Pulse Rate:  [59-71] 70 (09/08 1130) Resp:  [11-20] 15 (09/08 1130) BP: (108-171)/(52-97) 166/61 (09/08 1206) SpO2:  [98 %-100 %] 98 % (09/08 1130) Weight:  [67.6 kg] 67.6 kg (09/08 0800)  Weight change:  Filed Weights   01/26/22 0800 01/26/22 1151 01/30/22 0800  Weight: 70.9 kg 69.4 kg 67.6 kg    Intake/Output: I/O last 3 completed shifts: In: 63.7 [Other:15; IV Piggyback:48.7] Out: 345 [Drains:345]   Intake/Output this shift:  Total I/O In: -  Out: 1500 [Other:1500]  Physical Exam: General: NAD  Head: Normocephalic, atraumatic. Moist oral mucosal membranes  Eyes: Anicteric  Lungs:  Clear to auscultation, normal effort, room air  Heart: Regular rate and rhythm  Abdomen:  Soft, tender  Extremities: No peripheral edema.  Neurologic: Nonfocal, moving all four extremities  Skin: No lesions  Access: Right chest PermCath    Basic Metabolic Panel: Recent Labs  Lab 01/24/22 0501 01/25/22 0320 01/26/22 0552 01/27/22 0638 01/28/22 0547  NA 133* 127* 127* 132* 131*  K 4.0 4.1 4.4 4.3 4.5  CL 95* 93* 92* 93* 94*  CO2 '27 23 25 25 25  '$ GLUCOSE 168* 108* 130* 101* 72  BUN 23 31* 41* 23 30*  CREATININE 4.72* 6.19* 7.68* 5.46* 6.91*  CALCIUM 8.2* 8.3* 8.5* 9.1 8.7*  MG 1.8 1.9 2.0 1.8 1.9     Liver Function Tests: No results for input(s): "AST", "ALT", "ALKPHOS", "BILITOT", "PROT", "ALBUMIN" in the last 168 hours.  No  results for input(s): "LIPASE", "AMYLASE" in the last 168 hours. No results for input(s): "AMMONIA" in the last 168 hours.  CBC: Recent Labs  Lab 01/25/22 0320 01/26/22 0552 01/27/22 4010 01/28/22 0547 01/30/22 0756  WBC 20.9* 17.0* 24.2* 18.0* 17.4*  NEUTROABS  --   --   --   --  14.5*  HGB 9.8* 9.5* 9.6* 9.4* 9.6*  HCT 31.1* 30.2* 31.0* 29.4* 30.9*  MCV 86.4 86.8 85.6 87.0 87.8  PLT 399 434* 447* 424* 440*     Cardiac Enzymes: No results for input(s): "CKTOTAL", "CKMB", "CKMBINDEX", "TROPONINI" in the last 168 hours.  BNP: Invalid input(s): "POCBNP"  CBG: Recent Labs  Lab 01/29/22 0820 01/29/22 1213 01/29/22 1617 01/29/22 2101 01/30/22 0721  GLUCAP 148* 133* 98 134* 220*     Microbiology: Results for orders placed or performed during the hospital encounter of 01/18/22  Blood Culture (routine x 2)     Status: None   Collection Time: 01/18/22 11:51 AM   Specimen: BLOOD  Result Value Ref Range Status   Specimen Description BLOOD LEFT AC  Final   Special Requests   Final    BOTTLES DRAWN AEROBIC AND ANAEROBIC Blood Culture results may not be optimal due to an excessive volume of blood received in culture bottles   Culture   Final    NO GROWTH 5 DAYS Performed at [facility], [address] [place], [place] [address]    Report Status 01/23/2022  FINAL  Final  Resp Panel by RT-PCR (Flu A&B, Covid) Anterior Nasal Swab     Status: None   Collection Time: 01/18/22 11:51 AM   Specimen: Anterior Nasal Swab  Result Value Ref Range Status   SARS Coronavirus 2 by RT PCR NEGATIVE NEGATIVE Final    Comment: (NOTE) SARS-CoV-2 target nucleic acids are NOT DETECTED.  The SARS-CoV-2 RNA is generally detectable in upper respiratory specimens during the acute phase of infection. The lowest concentration of SARS-CoV-2 viral copies this assay can detect is 138 copies/mL. A negative result does not preclude SARS-Cov-2 infection and should not be used as the sole  basis for treatment or other patient management decisions. A negative result may occur with  improper specimen collection/handling, submission of specimen other than nasopharyngeal swab, presence of viral mutation(s) within the areas targeted by this assay, and inadequate number of viral copies(<138 copies/mL). A negative result must be combined with clinical observations, patient history, and epidemiological information. The expected result is Negative.  Fact Sheet for Patients:  EntrepreneurPulse.com.au  Fact Sheet for Healthcare Providers:  IncredibleEmployment.be  This test is no t yet approved or cleared by the [place] FDA and  has been authorized for detection and/or diagnosis of SARS-CoV-2 by FDA under an Emergency Use Authorization (EUA). This EUA will remain  in effect (meaning this test can be used) for the duration of the COVID-19 declaration under Section 564(b)(1) of the Act, 21 U.S.C.section 360bbb-3(b)(1), unless the authorization is terminated  or revoked sooner.       Influenza A by PCR NEGATIVE NEGATIVE Final   Influenza B by PCR NEGATIVE NEGATIVE Final    Comment: (NOTE) The Xpert Xpress SARS-CoV-2/FLU/RSV plus assay is intended as an aid in the diagnosis of influenza from Nasopharyngeal swab specimens and should not be used as a sole basis for treatment. Nasal washings and aspirates are unacceptable for Xpert Xpress SARS-CoV-2/FLU/RSV testing.  Fact Sheet for Patients: EntrepreneurPulse.com.au  Fact Sheet for Healthcare Providers: IncredibleEmployment.be  This test is not yet approved or cleared by the [place] FDA and has been authorized for detection and/or diagnosis of SARS-CoV-2 by FDA under an Emergency Use Authorization (EUA). This EUA will remain in effect (meaning this test can be used) for the duration of the COVID-19 declaration under Section 564(b)(1) of the Act,  21 U.S.C. section 360bbb-3(b)(1), unless the authorization is terminated or revoked.  Performed at [facility], [address] [place], [place] [address]   Blood Culture (routine x 2)     Status: None   Collection Time: 01/18/22  2:30 PM   Specimen: BLOOD  Result Value Ref Range Status   Specimen Description BLOOD RIGHT ARM  Final   Special Requests   Final    BOTTLES DRAWN AEROBIC AND ANAEROBIC Blood Culture adequate volume   Culture   Final    NO GROWTH 5 DAYS Performed at [facility], [address], [place], [place] [address]    Report Status 01/27/2022 FINAL  Final  Aerobic/Anaerobic Culture w Gram Stain (surgical/deep wound)     Status: None (Preliminary result)   Collection Time: 01/21/22  3:30 PM   Specimen: Abscess  Result Value Ref Range Status   Specimen Description   Final    ABSCESS GALL BLADDER Performed at [facility], [address] [place], [place] [address]    Special Requests   Final    NONE Performed at [facility], [address], [place], [place] [address]  Gram Stain   Final    FEW WBC PRESENT,BOTH PMN AND MONONUCLEAR RARE GRAM POSITIVE COCCI IN PAIRS    Culture   Final    FEW ESCHERICHIA COLI MULTI-DRUG RESISTANT ORGANISM CRITICAL RESULT CALLED TO, READ BACK BY AND VERIFIED WITH: PHARMD K. Siharath [phone] FCP MODERATE STREPTOCOCCUS GALLOLYTICUS NO ANAEROBES ISOLATED Sent to [place] for further susceptibility testing. Performed at [facility], [address], [place], [place] [address]    Report Status PENDING  Incomplete   Organism ID, Bacteria ESCHERICHIA COLI  Final   Organism ID, Bacteria STREPTOCOCCUS GALLOLYTICUS  Final      Susceptibility   Escherichia coli - MIC*    AMPICILLIN >=32 RESISTANT Resistant     CEFAZOLIN >=64 RESISTANT Resistant     CEFEPIME 4 INTERMEDIATE Intermediate     CEFTAZIDIME >=64 RESISTANT Resistant     CEFTRIAXONE >=64 RESISTANT Resistant      CIPROFLOXACIN >=4 RESISTANT Resistant     GENTAMICIN <=1 SENSITIVE Sensitive     IMIPENEM RESISTANT Resistant     TRIMETH/SULFA <=20 SENSITIVE Sensitive     AMPICILLIN/SULBACTAM >=32 RESISTANT Resistant     PIP/TAZO 64 INTERMEDIATE Intermediate     * FEW ESCHERICHIA COLI   Streptococcus gallolyticus - MIC*    PENICILLIN 0.12 SENSITIVE Sensitive     CEFTRIAXONE <=0.12 SENSITIVE Sensitive     ERYTHROMYCIN >=8 RESISTANT Resistant     LEVOFLOXACIN >=16 RESISTANT Resistant     VANCOMYCIN 0.25 SENSITIVE Sensitive     * MODERATE STREPTOCOCCUS GALLOLYTICUS  Carbapenem Resistance Panel     Status: None   Collection Time: 01/21/22  3:30 PM  Result Value Ref Range Status   Carba Resistance IMP Gene NOT DETECTED NOT DETECTED Final   Carba Resistance VIM Gene NOT DETECTED NOT DETECTED Final   Carba Resistance NDM Gene NOT DETECTED NOT DETECTED Final   Carba Resistance KPC Gene NOT DETECTED NOT DETECTED Final   Carba Resistance OXA48 Gene NOT DETECTED NOT DETECTED Final    Comment: (NOTE) Cepheid Carba-R is an FDA-cleared nucleic acid amplification test  (NAAT)for the detection and differentiation of genes encoding the  most prevalent carbapenemases in bacterial isolate samples. Carbapenemase gene identification and implementation of comprehensive  infection control measures are recommended by the CDC to prevent the  spread of the resistant organisms. Performed at [facility], [place] [address], [place], [place] [address]     Coagulation Studies: No results for input(s): "LABPROT", "INR" in the last 72 hours.   Urinalysis: No results for input(s): "COLORURINE", "LABSPEC", "PHURINE", "GLUCOSEU", "HGBUR", "BILIRUBINUR", "KETONESUR", "PROTEINUR", "UROBILINOGEN", "NITRITE", "LEUKOCYTESUR" in the last 72 hours.  Invalid input(s): "APPERANCEUR"    Imaging: No results found.   Medications:    ceftazidime-avibactam (AVYCAZ) 0.94 g in dextrose 5 % 50 mL IVPB 0.94 g (01/29/22 1816)    chlorproMAZINE (THORAZINE) 12.5 mg in sodium chloride 0.9 % 25 mL IVPB 50 mL/hr at 01/29/22 1512    amLODipine  10 mg Oral Daily   aspirin EC  81 mg Oral Daily   Chlorhexidine Gluconate Cloth  6 each Topical Q0600   donepezil  10 mg Oral Daily   feeding supplement (NEPRO CARB STEADY)  237 mL Oral TID BM   gabapentin  300 mg Oral QHS   heparin  5,000 Units Subcutaneous Q8H   hydrALAZINE  100 mg Oral Q8H   insulin aspart  0-15 Units Subcutaneous TID WC   insulin detemir  10 Units Subcutaneous QHS   levothyroxine  137 mcg Oral Q0600   loratadine  10 mg Oral QPM   losartan  100 mg Oral QPM   metoprolol succinate  50 mg Oral Daily   metroNIDAZOLE  500 mg Oral Q12H   pantoprazole  40 mg Oral Daily   simvastatin  20 mg Oral q1800   sodium chloride flush  3 mL Intravenous Q12H   sodium chloride flush  5 mL Intracatheter Q8H   acetaminophen **OR** acetaminophen, albuterol, alum & mag hydroxide-simeth, chlorproMAZINE (THORAZINE) 12.5 mg in sodium chloride 0.9 % 25 mL IVPB  Assessment/ Plan:  Joseph Mcpherson is a 76 y.o.  male with a past medical history of hypertension, end-stage renal disease, on hemodialysis on Monday Wednesday Friday schedule, diabetes mellitus, CVA who was brought to the ER with chief complaint of weakness.  CK [name]/MWF/right chest PermCath  End-stage renal disease on hemodialysis.    Dialysis today with UF goal 1-1.5L as tolerated. Outpatient dialysis scheduled has been adjusted to TTS.Will provide a short treatment tomorrow to get on new schedule.   2. Anemia of chronic kidney disease  Lab Results  Component Value Date   HGB 9.6 (L) 01/30/2022    Hgb within acceptable range.    3. Secondary Hyperparathyroidism: with outpatient labs: PTH 422, phosphorus 6.4, calcium 9.1 on 12/29/2021.   Lab Results  Component Value Date   CALCIUM 8.7 (L) 01/28/2022   CAION 0.83 (LL) 12/10/2020   PHOS 2.3 (L) 01/21/2022    Bone minerals are acceptable at this  time  4.  Hypertension with chronic kidney disease.  Home regimen includes hydralazine and losartan.  Currently receiving these medications along with metoprolol. Blood pressure stable during dialysis  5. Diabetes mellitus type II with chronic kidney disease insulin dependent. Home regimen includes NovoLog and Levemir. Most recent hemoglobin A1c is 8.1 on 01/18/2022.  Currently prescribed Januvia outpatient.  Currently held.  Elevated this morning. Primary team to manage sliding scale insulin.   6.  Acute cholecystitis seen on CT.  Surgery consulted.  IR placed percutaneous drain on 01/21/22 and stabilizing with medications.  Plan for percutaneous drain for 6 weeks.  Was receiving Augmentin. Now receiving Flagyl and Avycaz.    LOS: [place] 9/8/202312:10 PM

## 2022-01-30 NOTE — Progress Notes (Signed)
Post HD RN assessment 

## 2022-01-30 NOTE — Progress Notes (Cosign Needed)
Patient is not able to walk the distance required to go the bathroom, or he/she is unable to safely negotiate stairs required to access the bathroom.  A 3in1 BSC will alleviate this problem     Durable Medical Equipment  (From admission, onward)           Start     Ordered   01/30/22 1033  For home use only DME Walker rolling  Once       Question Answer Comment  Walker: With [place]   Patient needs a walker to treat with the following condition General weakness      01/30/22 1033   01/30/22 1033  For home use only DME Bedside commode  Once       Question:  Patient needs a bedside commode to treat with the following condition  Answer:  General weakness   01/30/22 1033   01/30/22 1032  For home use only DME lightweight manual wheelchair with seat cushion  Once       Comments: Patient suffers from  which impairs their ability to perform daily activities like bathing, dressing, and grooming in the home.  A cane or walker will not resolve  issue with performing activities of daily living. A wheelchair will allow patient to safely perform daily activities. Patient is not able to propel themselves in the home using a standard weight wheelchair due to general weakness. Patient can self propel in the lightweight wheelchair. Length of need 6 months . Accessories: elevating leg rests (ELRs), wheel locks, extensions and anti-tippers.   01/30/22 1032   01/20/22 1143  For home use only DME Walker rolling  Once       Question Answer Comment  Walker: With [place]   Patient needs a walker to treat with the following condition General weakness      01/20/22 1142

## 2022-01-30 NOTE — Progress Notes (Signed)
OT Cancellation Note  Patient Details Name: Joseph Mcpherson MRN: [id number] DOB: 1945/11/11   Cancelled Treatment:    Reason Eval/Treat Not Completed: OT screened, no needs identified, will sign off. Duplicate orders received. Pt evaluated by OT this admission and signed off as pt is near functional baseline for ADLs and mobility with strong family support. Please see 01/20/22 evaluation for recommendations. Per conversation with PTA yesterday, pt mobilizing Independently to and from toilet, appears to continue to be near baseline. Will sign off. Thank you.    Dessie Coma, M.S. OTR/L  01/30/22, 8:07 AM  ascom [phone]

## 2022-01-30 NOTE — Progress Notes (Signed)
Joseph Mcpherson NAME: Joseph Mcpherson    MR#:  [id number]  DATE OF BIRTH:  Dec 07, 1945  SUBJECTIVE:  no family at bedside in the morning during my evaluation. Seen at HD. Eager to go home Tells me he walks to the bathroom. Eating small amounts. Appetite not the best. Per RN ambulating with walker to the bathroom.  VITALS:  Blood pressure (!) 166/61, pulse 70, temperature 98.4 F (36.9 C), temperature source Oral, resp. rate 16, height '5\' 4"'$  (1.626 m), weight 66.7 kg, SpO2 99 %.  PHYSICAL EXAMINATION:   GENERAL:  76 y.o.-year-old patient lying in the bed with no acute distress.  LUNGS: Normal breath sounds bilaterally, no wheezing, rales, rhonchi.  CARDIOVASCULAR: S1, S2 normal. No murmurs, rubs, or gallops.  ABDOMEN: Soft, nontender, nondistended. Bowel sounds present. Gallbladder drain + EXTREMITIES: No  edema b/l.    NEUROLOGIC: nonfocal  patient is alert and awake SKIN: per RN  LABORATORY PANEL:  CBC Recent Labs  Lab 01/30/22 1323  WBC 18.4*  HGB 10.2*  HCT 34.4*  PLT 524*     Chemistries  Recent Labs  Lab 01/28/22 0547 01/30/22 1323  NA 131* 133*  K 4.5 4.6  CL 94* 96*  CO2 25 24  GLUCOSE 72 179*  BUN 30* 22  CREATININE 6.91* 7.00*  CALCIUM 8.7* 9.1  MG 1.9  --      Assessment and Plan  Joseph Mcpherson is a 76 y.o. 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     Acute cholecystitis S/p chole tube on 8/30 --dx by CT c/a/p.   --GenSurg consulted, rec chole tube --started on vanc/cefepime on presentation, then switched to ceftriaxone and flagyl on 8/30--now on IV Avycaz (total 7 days) --CT a/p w contrast 9/2 showed no abscess formation or other acute finding other than cholecystitis --chole tube care and flushes --outpatient surgical f/u in 3-4 weeks --Percutaneous cholecystostomy drain to remain in  place at least 6 weeks. Recommend fluoroscopy with injection of the drain in IR to evaluate for patency of the cystic duct. 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 Pt has 3 more doses left and given HD, IV access will give remainder of doses inhouse. Family aware. Last dose monday   Leukocytosis  --repeat CT a/p w contrast 9/2 showed no abscess formation or other acute finding other than cholecystitis --WBC 34K--18K--17K   Sepsis --On admission patient noted to be febrile up to 101.4 F with mild tachypnea, and labs significant for WBC 34.6 with lactic acid reassuring at 1.  Source acute cholecystitis --sepsis improving   Acute metabolic encephalopathy, improved --on presentation, somnolent, altered, due to infection.  Started to become more alert  now.   Hyperkalemia due to missing HD --  K 4.5   ESRD on HD MWF Patient normally dialyzes Monday, Wednesday, Friday -HD per nephrology   Weakness Patient noted to be grossly weak needing assistance to ambulate. - CT scan of the brain negative   --PT/OT rec SNF rehab--doing better--HHPT   Poor oral intake --started PTA, but wife said pt eats little at baseline --Nepro TID   Essential hypertension --BP has been elevated on home BP meds --cont Toprol,hydralazine and losartan   Hyponatremia, acute --Suspect patient likely at least partially fluid overloaded recent missed  dialysis. --monitor Na    Anemia chronic kidney disease  --hgb 9.4  Alzheimer's disease -Delirium precautions -Continue donepezil   Insulin-dependent diabetes mellitus type 2, uncontrolled With Hyperglycemia --A1c 8.1. --Home medication regimen includes Januvia 50 mg daily, NovoLog 10 units subcu nightly, and Levemir 20 units nightly. --hypoglycemic on 8/30 due to NPO --cont Levemir 12u nightly --SSI mod scale --cont mealtime 3u TID   Hypothyroidism -Continue  levothyroxine   Mixed hyperlipidemia -Continue statin   History of CVA -Continue statin and aspirin   GERD -Continue Protonix     DVT prophylaxis: Heparin SQ Code Status: Full code  Family Communication: Spoke with dter [name] on the phone 9/7  Level of care: Med-Surg Status is: Inpatient Remains inpatient appropriate because: ID consult pending regarding further additional antibiotic need--and duration Patient per PT ok with HHPT. Will d/c home once IV abx regimen is completed. EDD 9/11  TOTAL TIME TAKING CARE OF THIS PATIENT: 35 minutes.  >50% time spent on counselling and coordination of care  Note: This dictation was prepared with Dragon dictation along with smaller phrase technology. Any transcriptional errors that result from this process are unintentional.  Fritzi Mandes M.D    [facility]   CC: Primary care physician; [name], MD

## 2022-01-30 NOTE — Progress Notes (Signed)
Pre HD RN assessment 

## 2022-01-30 NOTE — Progress Notes (Signed)
Pt 3.5 hr HD complete w/ no complications. Alert, no c/o, vss, report to primary RN.  Start: 3976 End: 1202 1575m fluid removed 83.9L BVP 66.7 Kg post HD standing weight No meds given w/ HD

## 2022-01-30 NOTE — Progress Notes (Signed)
   Date of Admission:  01/18/2022   ID: Joseph Mcpherson is a 76 y.o. male Principal Problem:   Sepsis ([place]) Active Problems:   Essential hypertension   Mixed hyperlipidemia   Insulin dependent type 2 diabetes mellitus (HCC)   Hypothyroidism   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[place])   ESRD (end stage renal disease) on dialysis ([place])   Anemia in chronic kidney disease   History of CVA (cerebrovascular accident)   Hyperkalemia   Generalized weakness   Hyponatremia   Acute cholecystitis    Subjective: Pt doing fine Finished HD and is back to the room  amLODipine  10 mg Oral Daily   aspirin EC  81 mg Oral Daily   Chlorhexidine Gluconate Cloth  6 each Topical Q0600   donepezil  10 mg Oral Daily   feeding supplement (NEPRO CARB STEADY)  237 mL Oral TID BM   gabapentin  300 mg Oral QHS   heparin  5,000 Units Subcutaneous Q8H   hydrALAZINE  100 mg Oral Q8H   insulin aspart  0-15 Units Subcutaneous TID WC   insulin detemir  10 Units Subcutaneous QHS   levothyroxine  137 mcg Oral Q0600   loratadine  10 mg Oral QPM   losartan  100 mg Oral QPM   metoprolol succinate  50 mg Oral Daily   metroNIDAZOLE  500 mg Oral Q12H   pantoprazole  40 mg Oral Daily   simvastatin  20 mg Oral q1800   sodium chloride flush  3 mL Intravenous Q12H   sodium chloride flush  5 mL Intracatheter Q8H    Objective: Vital signs in last 24 hours: Temp:  [97.7 F (36.5 C)-98.6 F (37 C)] 98.3 F (36.8 C) (09/08 0800) Pulse Rate:  [59-68] 64 (09/08 1000) Resp:  [14-20] 14 (09/08 1000) BP: (131-171)/(52-74) 166/74 (09/08 1000) SpO2:  [98 %-100 %] 100 % (09/08 1000) Weight:  [67.6 kg] 67.6 kg (09/08 0800)    PHYSICAL EXAM:  General: Alert, cooperative, no distress, appears stated age.  Lungs: b/l air entry Few basal crepts Heart: Regular rate and rhythm, no murmur, rub or gallop. Abdomen: Soft, rt upper quadrant drain Neurologic: Grossly non-focal  Lab Results Recent Labs    01/28/22 0547  01/30/22 0756  WBC 18.0* 17.4*  HGB 9.4* 9.6*  HCT 29.4* 30.9*  NA 131*  --   K 4.5  --   CL 94*  --   CO2 25  --   BUN 30*  --   CREATININE 6.91*  --    Microbiology: CRE e.coli Studies/Results: No results found.   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

## 2022-01-31 ENCOUNTER — Other Ambulatory Visit: Payer: Self-pay | Admitting: Internal Medicine

## 2022-01-31 DIAGNOSIS — K810 Acute cholecystitis: Secondary | ICD-10-CM | POA: Diagnosis not present

## 2022-01-31 LAB — GLUCOSE, CAPILLARY
Glucose-Capillary: 181 mg/dL — ABNORMAL HIGH (ref 70–99)
Glucose-Capillary: 99 mg/dL (ref 70–99)
Glucose-Capillary: 222 mg/dL — ABNORMAL HIGH (ref 70–99)
Glucose-Capillary: 150 mg/dL — ABNORMAL HIGH (ref 70–99)

## 2022-01-31 MED ORDER — NAPHAZOLINE-GLYCERIN 0.012-0.25 % OP SOLN
1.0000 [drp] | Freq: Two times a day (BID) | OPHTHALMIC | Status: DC | PRN
  Filled 2022-01-31: qty 15

## 2022-01-31 NOTE — Progress Notes (Signed)
[facility]  Dialysis Note   Subjective:   Seen and examined on hemodialysis treatment.  Feels much better.   HEMODIALYSIS FLOWSHEET:  Blood Flow Rate (mL/min): 400 mL/min Arterial Pressure (mmHg): -210 mmHg Venous Pressure (mmHg): 220 mmHg TMP (mmHg): 3 mmHg Ultrafiltration Rate (mL/min): 674 mL/min Dialysate Flow Rate (mL/min): 300 ml/min Dialysis Fluid Bolus: Normal Saline Bolus Amount (mL): 300 mL    Objective:  Vital signs in last 24 hours:  Temp:  [97.9 F (36.6 C)-98.7 F (37.1 C)] 98.7 F (37.1 C) (09/09 1134) Pulse Rate:  [70-89] 87 (09/09 1134) Resp:  [15-24] 17 (09/09 1134) BP: (145-182)/(52-85) 145/62 (09/09 1134) SpO2:  [97 %-100 %] 100 % (09/09 1134) Weight:  [67.1 kg] 67.1 kg (09/09 0810)  Weight change:  Filed Weights   01/30/22 0800 01/30/22 1225 01/31/22 0810  Weight: 67.6 kg 66.7 kg 67.1 kg    Intake/Output: I/O last 3 completed shifts: In: 110.2 [Other:15; IV Piggyback:95.2] Out: 1925 [Drains:425; Other:1500]   Intake/Output this shift:  Total I/O In: 5 [Other:5] Out: 1550 [Drains:50; Other:1500]  Physical Exam: General: NAD,   Head: Normocephalic, atraumatic. Moist oral mucosal membranes  Eyes: Anicteric, PERRL  Neck: Supple, trachea midline  Lungs:  Clear to auscultation  Heart: Regular rate and rhythm  Abdomen:  Soft, nontender,   Extremities:  peripheral edema.  Neurologic: Nonfocal, moving all four extremities  Skin: No lesions  Access:     Basic Metabolic Panel: Recent Labs  Lab 01/25/22 0320 01/26/22 0552 01/27/22 0638 01/28/22 0547 01/30/22 1323  NA 127* 127* 132* 131* 133*  K 4.1 4.4 4.3 4.5 4.6  CL 93* 92* 93* 94* 96*  CO2 '23 25 25 25 24  '$ GLUCOSE 108* 130* 101* 72 179*  BUN 31* 41* 23 30* 22  CREATININE 6.19* 7.68* 5.46* 6.91* 7.00*  CALCIUM 8.3* 8.5* 9.1 8.7* 9.1  MG 1.9 2.0 1.8 1.9  --   PHOS  --   --   --   --  4.3    Liver Function Tests: Recent Labs  Lab 01/30/22 1323  ALBUMIN 2.9*    No results for input(s): "LIPASE", "AMYLASE" in the last 168 hours. No results for input(s): "AMMONIA" in the last 168 hours.  CBC: Recent Labs  Lab 01/26/22 0552 01/27/22 0638 01/28/22 0547 01/30/22 0756 01/30/22 1323  WBC 17.0* 24.2* 18.0* 17.4* 18.4*  NEUTROABS  --   --   --  14.5*  --   HGB 9.5* 9.6* 9.4* 9.6* 10.2*  HCT 30.2* 31.0* 29.4* 30.9* 34.4*  MCV 86.8 85.6 87.0 87.8 90.3  PLT 434* 447* 424* 440* 524*    Cardiac Enzymes: No results for input(s): "CKTOTAL", "CKMB", "CKMBINDEX", "TROPONINI" in the last 168 hours.  BNP: Invalid input(s): "POCBNP"  CBG: Recent Labs  Lab 01/30/22 0721 01/30/22 1714 01/30/22 2133 01/31/22 0722 01/31/22 1134  GLUCAP 220* 234* 149* 181* 99    Microbiology: Results for orders placed or performed during the hospital encounter of 01/18/22  Blood Culture (routine x 2)     Status: None   Collection Time: 01/18/22 11:51 AM   Specimen: BLOOD  Result Value Ref Range Status   Specimen Description BLOOD LEFT AC  Final   Special Requests   Final    BOTTLES DRAWN AEROBIC AND ANAEROBIC Blood Culture results may not be optimal due to an excessive volume of blood received in culture bottles   Culture   Final    NO GROWTH 5 DAYS Performed at [facility],  [place], [place] [address]    Report Status 01/23/2022 FINAL  Final  Resp Panel by RT-PCR (Flu A&B, Covid) Anterior Nasal Swab     Status: None   Collection Time: 01/18/22 11:51 AM   Specimen: Anterior Nasal Swab  Result Value Ref Range Status   SARS Coronavirus 2 by RT PCR NEGATIVE NEGATIVE Final    Comment: (NOTE) SARS-CoV-2 target nucleic acids are NOT DETECTED.  The SARS-CoV-2 RNA is generally detectable in upper respiratory specimens during the acute phase of infection. The lowest concentration of SARS-CoV-2 viral copies this assay can detect is 138 copies/mL. A negative result does not preclude SARS-Cov-2 infection and should not be used as the  sole basis for treatment or other patient management decisions. A negative result may occur with  improper specimen collection/handling, submission of specimen other than nasopharyngeal swab, presence of viral mutation(s) within the areas targeted by this assay, and inadequate number of viral copies(<138 copies/mL). A negative result must be combined with clinical observations, patient history, and epidemiological information. The expected result is Negative.  Fact Sheet for Patients:  EntrepreneurPulse.com.au  Fact Sheet for Healthcare Providers:  IncredibleEmployment.be  This test is no t yet approved or cleared by the [place] FDA and  has been authorized for detection and/or diagnosis of SARS-CoV-2 by FDA under an Emergency Use Authorization (EUA). This EUA will remain  in effect (meaning this test can be used) for the duration of the COVID-19 declaration under Section 564(b)(1) of the Act, 21 U.S.C.section 360bbb-3(b)(1), unless the authorization is terminated  or revoked sooner.       Influenza A by PCR NEGATIVE NEGATIVE Final   Influenza B by PCR NEGATIVE NEGATIVE Final    Comment: (NOTE) The Xpert Xpress SARS-CoV-2/FLU/RSV plus assay is intended as an aid in the diagnosis of influenza from Nasopharyngeal swab specimens and should not be used as a sole basis for treatment. Nasal washings and aspirates are unacceptable for Xpert Xpress SARS-CoV-2/FLU/RSV testing.  Fact Sheet for Patients: EntrepreneurPulse.com.au  Fact Sheet for Healthcare Providers: IncredibleEmployment.be  This test is not yet approved or cleared by the [place] FDA and has been authorized for detection and/or diagnosis of SARS-CoV-2 by FDA under an Emergency Use Authorization (EUA). This EUA will remain in effect (meaning this test can be used) for the duration of the COVID-19 declaration under Section 564(b)(1) of the  Act, 21 U.S.C. section 360bbb-3(b)(1), unless the authorization is terminated or revoked.  Performed at [facility], [address] [place], [place] [address]   Blood Culture (routine x 2)     Status: None   Collection Time: 01/18/22  2:30 PM   Specimen: BLOOD  Result Value Ref Range Status   Specimen Description BLOOD RIGHT ARM  Final   Special Requests   Final    BOTTLES DRAWN AEROBIC AND ANAEROBIC Blood Culture adequate volume   Culture   Final    NO GROWTH 5 DAYS Performed at [facility], [address] [place], [place] [address]    Report Status 01/27/2022 FINAL  Final  Aerobic/Anaerobic Culture w Gram Stain (surgical/deep wound)     Status: None (Preliminary result)   Collection Time: 01/21/22  3:30 PM   Specimen: Abscess  Result Value Ref Range Status   Specimen Description   Final    ABSCESS GALL BLADDER Performed at [facility], [address] [place], [place] [address]    Special Requests   Final    NONE  Performed at [facility], [address] [place], [place] [address]    Gram Stain   Final    FEW WBC PRESENT,BOTH PMN AND MONONUCLEAR RARE GRAM POSITIVE COCCI IN PAIRS    Culture   Final    FEW ESCHERICHIA COLI MULTI-DRUG RESISTANT ORGANISM CRITICAL RESULT CALLED TO, READ BACK BY AND VERIFIED WITH: PHARMD K. Seelig [id number] FCP MODERATE STREPTOCOCCUS GALLOLYTICUS NO ANAEROBES ISOLATED Sent to [place] for further susceptibility testing. Performed at [facility], [place] [address], [place], [place] [address]    Report Status PENDING  Incomplete   Organism ID, Bacteria ESCHERICHIA COLI  Final   Organism ID, Bacteria STREPTOCOCCUS GALLOLYTICUS  Final      Susceptibility   Escherichia coli - MIC*    AMPICILLIN >=32 RESISTANT Resistant     CEFAZOLIN >=64 RESISTANT Resistant     CEFEPIME 4 INTERMEDIATE Intermediate     CEFTAZIDIME >=64 RESISTANT Resistant     CEFTRIAXONE >=64 RESISTANT Resistant      CIPROFLOXACIN >=4 RESISTANT Resistant     GENTAMICIN <=1 SENSITIVE Sensitive     IMIPENEM RESISTANT Resistant     TRIMETH/SULFA <=20 SENSITIVE Sensitive     AMPICILLIN/SULBACTAM >=32 RESISTANT Resistant     PIP/TAZO 64 INTERMEDIATE Intermediate     * FEW ESCHERICHIA COLI   Streptococcus gallolyticus - MIC*    PENICILLIN 0.12 SENSITIVE Sensitive     CEFTRIAXONE <=0.12 SENSITIVE Sensitive     ERYTHROMYCIN >=8 RESISTANT Resistant     LEVOFLOXACIN >=16 RESISTANT Resistant     VANCOMYCIN 0.25 SENSITIVE Sensitive     * MODERATE STREPTOCOCCUS GALLOLYTICUS  Carbapenem Resistance Panel     Status: None   Collection Time: 01/21/22  3:30 PM  Result Value Ref Range Status   Carba Resistance IMP Gene NOT DETECTED NOT DETECTED Final   Carba Resistance VIM Gene NOT DETECTED NOT DETECTED Final   Carba Resistance NDM Gene NOT DETECTED NOT DETECTED Final   Carba Resistance KPC Gene NOT DETECTED NOT DETECTED Final   Carba Resistance OXA48 Gene NOT DETECTED NOT DETECTED Final    Comment: (NOTE) Cepheid Carba-R is an FDA-cleared nucleic acid amplification test  (NAAT)for the detection and differentiation of genes encoding the  most prevalent carbapenemases in bacterial isolate samples. Carbapenemase gene identification and implementation of comprehensive  infection control measures are recommended by the CDC to prevent the  spread of the resistant organisms. Performed at [facility], [place] [address] [place], [place] [address]   Susceptibility, Aer + Anaerob     Status: Abnormal   Collection Time: 01/21/22  3:30 PM  Result Value Ref Range Status   Suscept, Aer + Anaerob Preliminary report (A)  Final    Comment: (NOTE) Performed At: [facility] [place], [place] [id number] Rush Farmer MD CH:[id number]    Source of Sample   Final    [phone] [place] SUSCEPTIBILITY TESTING [facility] ABSCESS GALL BLADDER    Comment: Performed at [facility], [place] [address]  [place], [place] [address]  Susceptibility Result     Status: Abnormal   Collection Time: 01/21/22  3:30 PM  Result Value Ref Range Status   Suscept Result 1 Escherichia coli (A)  Final    Comment: (NOTE) Identification performed by account, not confirmed by this laboratory. Performed At: [facility] [place], [place] [id number] Rush Farmer MD QH:[id number]     Coagulation Studies: No results for input(s): "LABPROT", "INR" in the last  72 hours.  Urinalysis: No results for input(s): "COLORURINE", "LABSPEC", "PHURINE", "GLUCOSEU", "HGBUR", "BILIRUBINUR", "KETONESUR", "PROTEINUR", "UROBILINOGEN", "NITRITE", "LEUKOCYTESUR" in the last 72 hours.  Invalid input(s): "APPERANCEUR"    Imaging: No results found.   Medications:    ceftazidime-avibactam (AVYCAZ) 0.94 g in dextrose 5 % 50 mL IVPB Stopped (01/30/22 2009)   chlorproMAZINE (THORAZINE) 12.5 mg in sodium chloride 0.9 % 25 mL IVPB Stopped (01/29/22 1526)    amLODipine  10 mg Oral Daily   aspirin EC  81 mg Oral Daily   Chlorhexidine Gluconate Cloth  6 each Topical Q0600   donepezil  10 mg Oral Daily   feeding supplement (NEPRO CARB STEADY)  237 mL Oral TID BM   gabapentin  300 mg Oral QHS   heparin  5,000 Units Subcutaneous Q8H   hydrALAZINE  100 mg Oral Q8H   insulin aspart  0-15 Units Subcutaneous TID WC   insulin detemir  10 Units Subcutaneous QHS   levothyroxine  137 mcg Oral Q0600   loratadine  10 mg Oral QPM   losartan  100 mg Oral QPM   metoprolol succinate  50 mg Oral Daily   metroNIDAZOLE  500 mg Oral Q12H   pantoprazole  40 mg Oral Daily   simvastatin  20 mg Oral q1800   sodium chloride flush  3 mL Intravenous Q12H   sodium chloride flush  5 mL Intracatheter Q8H   acetaminophen **OR** acetaminophen, albuterol, alum & mag hydroxide-simeth, chlorproMAZINE (THORAZINE) 12.5 mg in sodium chloride 0.9 % 25 mL IVPB  Assessment/ Plan:  Mr. Pal Shell is a 76 y.o.  male   Principal  Problem:   Sepsis ([place]) Active Problems:   Essential hypertension   Mixed hyperlipidemia   Insulin dependent type 2 diabetes mellitus ([place])   Hypothyroidism   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[place])   ESRD (end stage renal disease) on dialysis ([place])   Anemia in chronic kidney disease   History of CVA (cerebrovascular accident)   Hyperkalemia   Generalized weakness   Hyponatremia   Acute cholecystitis   Febrile illness  76 year old gentleman with history of hypertension, diabetes, peripheral vascular disease, CVA, ESRD on hemodialysis.   End Stage Renal Disease on hemodialysis: Tolerating dialysis treatment with fluid removal today.  Potassium [facility] vascular lab)  Date Value Ref Range Status  09/19/2019 4.4 3.5 - 5.1 Final    Comment:    Performed at [facility], [address] [place], [place] [address]    Intake/Output Summary (Last 24 hours) at 01/31/2022 1519 Last data filed at 01/31/2022 1135 Gross per 24 hour  Intake 65 ml  Output 1760 ml  Net -1695 ml    2. Hypertension with chronic kidney disease: Antihypertensive medications reviewed.   BP (!) 145/62 (BP Location: Right Arm)   Pulse 87   Temp 98.7 F (37.1 C)   Resp 17   Ht '5\' 4"'$  (1.626 m)   Wt 67.1 kg   SpO2 100%   BMI 25.39 kg/m   3. Anemia of chronic kidney disease/ kidney injury/chronic disease/acute blood loss:   Lab Results  Component Value Date   HGB 10.2 (L) 01/30/2022  We will continue to monitor and continue anemia protocol.  4. Secondary Hyperparathyroidism: We will monitor closely.   Lab Results  Component Value Date   CALCIUM 9.1 01/30/2022   CAION 0.83 (LL) 12/10/2020   PHOS 4.3 01/30/2022    #5: Acute cholecystitis: S/p cholecystotomy tube placement.  #6: Sepsis: Ceftazidime discontinued and  is presently on metronidazole.   We will continue to follow.   LOS: [place] 9/9/20233:19 PM

## 2022-01-31 NOTE — Progress Notes (Signed)
Post hd rn assessment 

## 2022-01-31 NOTE — Progress Notes (Signed)
[facility] [place] NAME: Joseph Mcpherson    MR#:  [id number]  DATE OF BIRTH:  December 19, 1945  SUBJECTIVE:   Seen at HD. Eager to go home Tells me he walks to the bathroom. Eating small amounts. Appetite not the best. Per RN ambulating with walker to the bathroom.  VITALS:  Blood pressure (!) 145/62, pulse 87, temperature 98.7 F (37.1 C), resp. rate 17, height '5\' 4"'$  (1.626 m), weight 67.1 kg, SpO2 100 %.  PHYSICAL EXAMINATION:   GENERAL:  76 y.o.-year-old patient lying in the bed with no acute distress.  LUNGS: Normal breath sounds bilaterally, no wheezing, rales, rhonchi.  CARDIOVASCULAR: S1, S2 normal. No murmurs, rubs, or gallops.  ABDOMEN: Soft, nontender, nondistended. Bowel sounds present. Gallbladder drain + EXTREMITIES: No  edema b/l.    NEUROLOGIC: nonfocal  patient is alert and awake SKIN: per RN  LABORATORY PANEL:  CBC Recent Labs  Lab 01/30/22 1323  WBC 18.4*  HGB 10.2*  HCT 34.4*  PLT 524*     Chemistries  Recent Labs  Lab 01/28/22 0547 01/30/22 1323  NA 131* 133*  K 4.5 4.6  CL 94* 96*  CO2 25 24  GLUCOSE 72 179*  BUN 30* 22  CREATININE 6.91* 7.00*  CALCIUM 8.7* 9.1  MG 1.9  --      Assessment and Plan  Joseph Mcpherson is a 76 y.o. 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     Acute cholecystitis S/p chole tube on 8/30 --dx by CT c/a/p.   --GenSurg consulted, rec chole tube --started on vanc/cefepime on presentation, then switched to ceftriaxone and flagyl on 8/30--now on IV Avycaz (total 7 days) --CT a/p w contrast 9/2 showed no abscess formation or other acute finding other than cholecystitis --chole tube care and flushes --outpatient surgical f/u in 3-4 weeks --Percutaneous cholecystostomy drain to remain in place at least 6 weeks. Recommend fluoroscopy with injection of the drain in IR to  evaluate for patency of the cystic duct. 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 Pt has 3 more doses left and given HD, IV access will give remainder of doses inhouse. Family aware. Last dose Monday 02/02/22   Leukocytosis  --repeat CT a/p w contrast 9/2 showed no abscess formation or other acute finding other than cholecystitis --WBC 34K--18K--17K   Sepsis --On admission patient noted to be febrile up to 101.4 F with mild tachypnea, and labs significant for WBC 34.6 with lactic acid reassuring at 1.  Source acute cholecystitis --sepsis resolved   Acute metabolic encephalopathy, improved --on presentation, somnolent, altered, due to infection.  --more alert and at baseline   Hyperkalemia due to missing HD --  K 4.5   ESRD on HD MWF Patient will get HD now TTS -HD per nephrology   Weakness Patient noted to be grossly weak needing assistance to ambulate. -- CT scan of the brain negative   --PT/OT rec SNF rehab--doing better--HHPT   Poor oral intake --started PTA, but wife said pt eats little at baseline --Nepro TID   Essential hypertension --BP has been elevated on home BP meds --cont Toprol,hydralazine and losartan   Hyponatremia, acute --Suspect patient likely at least partially fluid overloaded recent missed dialysis. --monitor Na    Anemia chronic kidney disease  --hgb 9.4  Alzheimer's disease -Continue donepezil   Insulin-dependent diabetes mellitus type 2, uncontrolled With Hyperglycemia --A1c 8.1. --Home medication regimen includes Januvia 50 mg daily, NovoLog 10 units subcu nightly, and Levemir 20 units nightly. --hypoglycemic on 8/30 due to NPO --cont Levemir 12u nightly --SSI mod scale --cont mealtime 3u TID   Hypothyroidism -Continue levothyroxine   Mixed hyperlipidemia -Continue statin   History of CVA -Continue statin and aspirin   GERD -Continue  Protonix     DVT prophylaxis: Heparin SQ Code Status: Full code  Family Communication: Spoke with wife on the floor  Level of care: Med-Surg Status is: Inpatient Remains inpatient appropriate because: Patient per PT ok with HHPT. Will d/c home once IV abx regimen is completed. EDD 9/11  TOTAL TIME TAKING CARE OF THIS PATIENT: 35 minutes.  >50% time spent on counselling and coordination of care  Note: This dictation was prepared with Dragon dictation along with smaller phrase technology. Any transcriptional errors that result from this process are unintentional.  Fritzi Mandes M.D    [facility]   CC: Primary care physician; [name], MD

## 2022-01-31 NOTE — Progress Notes (Signed)
Pt 3 hour HD complete w/ no complications. Pt alert, no c/o, vss, report to primary RN. Start: 0807 End: 1107 1559m fluid removed 72L BVP 67.1kg standing post HD weight No meds ordered for HD

## 2022-01-31 NOTE — TOC Progression Note (Addendum)
Transition of Care [facility]) - Progression Note    Patient Details  Name: Joseph Mcpherson MRN: [id number] Date of Birth: 1946/02/06  Transition of Care [facility]) CM/SW [place], LCSW Phone Number: 01/31/2022, 3:40 PM  Clinical Narrative:    Received VM from patient's daughter [name] asking what the schedule will be for [facility] so she can arrange for private duty nursing on the days patient does not get [facility] visits.  CSW provided contact information to [facility] so that [name] can reach out to them about the schedule. Dina contacted [facility] who told her they don't have a referral.   CSW reached out to [place] with [facility] again who confirmed they have the referral and stated he does not send it to the office until patient discharges. Tommi Rumps stated they would not know patient's HH schedule until their initial visit where they determine what the need is. CSW notified [name].     Expected Discharge Plan: Skilled Nursing Facility Barriers to Discharge: Ship broker, Continued Medical Work up, SNF Pending bed offer  Expected Discharge Plan and Services Expected Discharge Plan: [place] Choice: [place] arrangements for the past 2 months: Single Family Home                                       Social Determinants of Health (SDOH) Interventions    Readmission Risk Interventions     No data to display

## 2022-01-31 NOTE — Progress Notes (Signed)
Pre hd rn assessment 

## 2022-01-31 NOTE — Plan of Care (Signed)

## 2022-02-01 DIAGNOSIS — K810 Acute cholecystitis: Secondary | ICD-10-CM | POA: Diagnosis not present

## 2022-02-01 LAB — GLUCOSE, CAPILLARY
Glucose-Capillary: 177 mg/dL — ABNORMAL HIGH (ref 70–99)
Glucose-Capillary: 156 mg/dL — ABNORMAL HIGH (ref 70–99)
Glucose-Capillary: 198 mg/dL — ABNORMAL HIGH (ref 70–99)

## 2022-02-01 NOTE — Progress Notes (Signed)
[facility]  PROGRESS NOTE   Subjective:   Seen at bedside.  Comfortable. Appetite has improved.  Objective:  Vital signs: Blood pressure (!) 153/60, pulse (!) 58, temperature 99 F (37.2 C), resp. rate 16, height '5\' 4"'$  (1.626 m), weight 67.1 kg, SpO2 98 %.  Intake/Output Summary (Last 24 hours) at 02/01/2022 1439 Last data filed at 02/01/2022 0545 Gross per 24 hour  Intake 250 ml  Output 165 ml  Net 85 ml   Filed Weights   01/30/22 0800 01/30/22 1225 01/31/22 0810  Weight: 67.6 kg 66.7 kg 67.1 kg     Physical Exam: General:  No acute distress  Head:  Normocephalic, atraumatic. Moist oral mucosal membranes  Eyes:  Anicteric  Neck:  Supple  Lungs:   Clear to auscultation, normal effort  Heart:  S1S2 no rubs  Abdomen:   Soft, nontender, bowel sounds present  Extremities:  peripheral edema.  Neurologic:  Awake, alert, following commands  Skin:  No lesions  Access:     Basic Metabolic Panel: Recent Labs  Lab 01/26/22 0552 01/27/22 0638 01/28/22 0547 01/30/22 1323  NA 127* 132* 131* 133*  K 4.4 4.3 4.5 4.6  CL 92* 93* 94* 96*  CO2 '25 25 25 24  '$ GLUCOSE 130* 101* 72 179*  BUN 41* 23 30* 22  CREATININE 7.68* 5.46* 6.91* 7.00*  CALCIUM 8.5* 9.1 8.7* 9.1  MG 2.0 1.8 1.9  --   PHOS  --   --   --  4.3    CBC: Recent Labs  Lab 01/26/22 0552 01/27/22 0638 01/28/22 0547 01/30/22 0756 01/30/22 1323  WBC 17.0* 24.2* 18.0* 17.4* 18.4*  NEUTROABS  --   --   --  14.5*  --   HGB 9.5* 9.6* 9.4* 9.6* 10.2*  HCT 30.2* 31.0* 29.4* 30.9* 34.4*  MCV 86.8 85.6 87.0 87.8 90.3  PLT 434* 447* 424* 440* 524*     Urinalysis: No results for input(s): "COLORURINE", "LABSPEC", "PHURINE", "GLUCOSEU", "HGBUR", "BILIRUBINUR", "KETONESUR", "PROTEINUR", "UROBILINOGEN", "NITRITE", "LEUKOCYTESUR" in the last 72 hours.  Invalid input(s): "APPERANCEUR"    Imaging: No results found.   Medications:    ceftazidime-avibactam (AVYCAZ) 0.94 g in dextrose 5 % 50 mL  IVPB 0.94 g (02/01/22 1243)    amLODipine  10 mg Oral Daily   aspirin EC  81 mg Oral Daily   Chlorhexidine Gluconate Cloth  6 each Topical Q0600   donepezil  10 mg Oral Daily   feeding supplement (NEPRO CARB STEADY)  237 mL Oral TID BM   gabapentin  300 mg Oral QHS   heparin  5,000 Units Subcutaneous Q8H   hydrALAZINE  100 mg Oral Q8H   insulin aspart  0-15 Units Subcutaneous TID WC   insulin detemir  10 Units Subcutaneous QHS   levothyroxine  137 mcg Oral Q0600   loratadine  10 mg Oral QPM   losartan  100 mg Oral QPM   metoprolol succinate  50 mg Oral Daily   metroNIDAZOLE  500 mg Oral Q12H   pantoprazole  40 mg Oral Daily   simvastatin  20 mg Oral q1800   sodium chloride flush  3 mL Intravenous Q12H   sodium chloride flush  5 mL Intracatheter Q8H    Assessment/ Plan:     Principal Problem:   Sepsis ([place]) Active Problems:   Essential hypertension   Mixed hyperlipidemia   Insulin dependent type 2 diabetes mellitus ([place])   Hypothyroidism   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[place])  ESRD (end stage renal disease) on dialysis ([place])   Anemia in chronic kidney disease   History of CVA (cerebrovascular accident)   Hyperkalemia   Generalized weakness   Hyponatremia   Acute cholecystitis   Febrile illness  76 year old gentleman with history of hypertension, diabetes, peripheral vascular disease, CVA, ESRD on hemodialysis admitted with history of fever, chills and malaise.  He was found to have acute cholecystitis.  He was started on IV antibiotics and also had percutaneous cholecystotomy..  #1: ESRD: Patient is stable dialysis yesterday.  He is on a TTS schedule.  #2: Sepsis: He has been on ceftazidime and metronidazole.  #3: Acute cholecystitis: S/p cholecystotomy.  #4: Anemia: Continue protocol.  #5: Hypertension: Continue to monitor closely.  Continue 2 g salt restricted diet along with antihypertensives as ordered.  #6: Diabetes: Better control at this time.   Will monitor closely.  We will follow closely.   LOS: [place], [facility] 9/10/20232:39 PM

## 2022-02-01 NOTE — Progress Notes (Signed)
[facility] NAME: Joseph Mcpherson    MR#:  [id number]  DATE OF BIRTH:  1946-02-01  SUBJECTIVE:  Feels better Wife at bedside  VITALS:  Blood pressure (!) 153/60, pulse (!) 58, temperature 99 F (37.2 C), resp. rate 16, height '5\' 4"'$  (1.626 m), weight 67.1 kg, SpO2 98 %.  PHYSICAL EXAMINATION:   GENERAL:  76 y.o.-year-old patient lying in the bed with no acute distress.  LUNGS: Normal breath sounds bilaterally, no wheezing, rales, rhonchi.  CARDIOVASCULAR: S1, S2 normal. No murmurs, rubs, or gallops.  ABDOMEN: Soft, nontender, nondistended. Bowel sounds present. Gallbladder drain + EXTREMITIES: No  edema b/l.   HD access NEUROLOGIC: nonfocal  patient is alert and awake SKIN: per RN  LABORATORY PANEL:  CBC Recent Labs  Lab 01/30/22 1323  WBC 18.4*  HGB 10.2*  HCT 34.4*  PLT 524*     Chemistries  Recent Labs  Lab 01/28/22 0547 01/30/22 1323  NA 131* 133*  K 4.5 4.6  CL 94* 96*  CO2 25 24  GLUCOSE 72 179*  BUN 30* 22  CREATININE 6.91* 7.00*  CALCIUM 8.7* 9.1  MG 1.9  --      Assessment and Plan  Joseph Mcpherson is a 76 y.o. 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     Acute cholecystitis S/p chole tube on 8/30 --dx by CT c/a/p.   --GenSurg consulted, rec chole tube --started on vanc/cefepime on presentation, then switched to ceftriaxone and flagyl on 8/30--now on IV Avycaz (total 7 days) --CT a/p w contrast 9/2 showed no abscess formation or other acute finding other than cholecystitis --chole tube care and flushes --outpatient surgical f/u in 3-4 weeks --Percutaneous cholecystostomy drain to remain in place at least 6 weeks. 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 Pt has 3 more doses left and given HD, IV  access will give remainder of doses inhouse. Family aware. Last dose Monday 02/02/22   Leukocytosis  --repeat CT a/p w contrast 9/2 showed no abscess formation or other acute finding other than cholecystitis --WBC 34K--18K--17K   Sepsis --On admission patient noted to be febrile up to 101.4 F with mild tachypnea, and labs significant for WBC 34.6 with lactic acid reassuring at 1.  Source acute cholecystitis --sepsis resolved   Acute metabolic encephalopathy, improved --on presentation, somnolent, altered, due to infection.  --more alert and at baseline   Hyperkalemia due to missing HD --  K 4.5   ESRD on HD MWF Patient will get HD now TTS -HD per nephrology   Weakness Patient noted to be grossly weak needing assistance to ambulate. -- CT scan of the brain negative   --PT/OT rec SNF rehab--doing better--HHPT   Poor oral intake --started PTA, but wife said pt eats little at baseline --Nepro TID   Essential hypertension --BP has been elevated on home BP meds --cont Toprol,hydralazine and losartan   Hyponatremia, acute --Suspect patient likely at least partially fluid overloaded recent missed dialysis. --monitor Na    Anemia chronic kidney disease  --hgb 9.4  Alzheimer's disease -Continue donepezil   Insulin-dependent diabetes mellitus type 2, uncontrolled With Hyperglycemia --A1c 8.1. --Home medication regimen includes Januvia 50 mg daily, NovoLog 10 units subcu nightly, and Levemir 20 units nightly. --hypoglycemic on 8/30 due to  NPO --cont Levemir q nightly --SSI mod scale --cont mealtime 3u TID   Hypothyroidism -Continue levothyroxine   Mixed hyperlipidemia -Continue statin   History of CVA -Continue statin and aspirin   GERD -Continue Protonix     DVT prophylaxis: Heparin SQ Code Status: Full code  Family Communication: Spoke with wife   Level of care: Med-Surg Status is: Inpatient Remains inpatient appropriate because: Patient per PT ok with  HHPT. Will d/c home once IV abx regimen is completed. EDD 9/11  TOTAL TIME TAKING CARE OF THIS PATIENT: 35 minutes.  >50% time spent on counselling and coordination of care  Note: This dictation was prepared with Dragon dictation along with smaller phrase technology. Any transcriptional errors that result from this process are unintentional.  Fritzi Mandes M.D    [facility]   CC: Primary care physician; [name], MD

## 2022-02-01 NOTE — Progress Notes (Signed)
Physical Therapy Treatment Patient Details Name: Joseph Mcpherson MRN: [id number] DOB: 08/28/1945 Today's Date: 02/01/2022   History of Present Illness Joseph Mcpherson is a 76 y.o. 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    PT Comments    OOB and is able to walk to/from rehab gym on 1A for stair training.  He does take a short seated rest break in gym due to fatigue but overall does well today.    Recommendations for follow up therapy are one component of a multi-disciplinary discharge planning process, led by the attending physician.  Recommendations may be updated based on patient status, additional functional criteria and insurance authorization.  Follow Up Recommendations  Home health PT Can patient physically be transported by private vehicle: Yes   Assistance Recommended at Discharge Intermittent Supervision/Assistance  Patient can return home with the following A little help with walking and/or transfers;A little help with bathing/dressing/bathroom;Assistance with cooking/housework;Help with stairs or ramp for entrance;Assist for transportation   Equipment Recommendations  Rolling walker (2 wheels);BSC/3in1    Recommendations for Other Services       Precautions / Restrictions Precautions Precautions: Fall Restrictions Weight Bearing Restrictions: No     Mobility  Bed Mobility Overal bed mobility: Needs Assistance Bed Mobility: Supine to Sit, Sit to Supine     Supine to sit: Supervision Sit to supine: Supervision        Transfers Overall transfer level: Needs assistance Equipment used: Rolling walker (2 wheels) Transfers: Sit to/from Stand Sit to Stand: Supervision           General transfer comment: cuing for hand placement to push to standing    Ambulation/Gait Ambulation/Gait assistance: Supervision Gait Distance (Feet): 150  Feet Assistive device: Rolling walker (2 wheels) Gait Pattern/deviations: Decreased stride length, Decreased step length - right, Decreased step length - left Gait velocity: decreased     General Gait Details: 150' x 2 to and from rehab gym on 1 A   Stairs Stairs: Yes Stairs assistance: Min guard Stair Management: Alternating pattern, Two rails Number of Stairs: 4 General stair comments: with ease   Wheelchair Mobility    Modified Rankin (Stroke Patients Only)       Balance Overall balance assessment: Needs assistance Sitting-balance support: Feet supported Sitting balance-Leahy Scale: Good Sitting balance - Comments: supervision static sitting EOB   Standing balance support: Bilateral upper extremity supported, During functional activity Standing balance-Leahy Scale: Fair                              Cognition Arousal/Alertness: Awake/alert Behavior During Therapy: WFL for tasks assessed/performed Overall Cognitive Status: Within Functional Limits for tasks assessed                                          Exercises      General Comments        Pertinent Vitals/Pain Pain Assessment Pain Assessment: No/denies pain    Home Living                          Prior Function            PT Goals (current goals can now be found in the care  plan section) Progress towards PT goals: Progressing toward goals    Frequency    Min 2X/week      PT Plan Current plan remains appropriate    Co-evaluation              AM-PAC PT "6 Clicks" Mobility   Outcome Measure  Help needed turning from your back to your side while in a flat bed without using bedrails?: None Help needed moving from lying on your back to sitting on the side of a flat bed without using bedrails?: A Little Help needed moving to and from a bed to a chair (including a wheelchair)?: A Little Help needed standing up from a chair using your arms  (e.g., wheelchair or bedside chair)?: A Little Help needed to walk in hospital room?: A Little Help needed climbing 3-5 steps with a railing? : A Little 6 Click Score: 19    End of Session   Activity Tolerance: Patient tolerated treatment well;Patient limited by fatigue Patient left: in bed;with call bell/phone within reach;with bed alarm set;with family/visitor present   PT Visit Diagnosis: Unsteadiness on feet (R26.81);Muscle weakness (generalized) (M62.81)     Time: 3953-2023 PT Time Calculation (min) (ACUTE ONLY): 16 min  Charges:  $Gait Training: 8-22 mins                   Chesley Noon, PTA 02/01/22, 3:33 PM

## 2022-02-02 DIAGNOSIS — K810 Acute cholecystitis: Secondary | ICD-10-CM | POA: Diagnosis not present

## 2022-02-02 DIAGNOSIS — N186 End stage renal disease: Secondary | ICD-10-CM | POA: Diagnosis not present

## 2022-02-02 DIAGNOSIS — A419 Sepsis, unspecified organism: Secondary | ICD-10-CM | POA: Diagnosis not present

## 2022-02-02 DIAGNOSIS — R531 Weakness: Secondary | ICD-10-CM | POA: Diagnosis not present

## 2022-02-02 LAB — CBC WITH DIFFERENTIAL/PLATELET
Abs Immature Granulocytes: 0.1 10*3/uL — ABNORMAL HIGH (ref 0.00–0.07)
Basophils Absolute: 0.1 10*3/uL (ref 0.0–0.1)
Basophils Relative: 1 %
Eosinophils Absolute: 0.1 10*3/uL (ref 0.0–0.5)
Eosinophils Relative: 1 %
HCT: 32.6 % — ABNORMAL LOW (ref 39.0–52.0)
Hemoglobin: 10.2 g/dL — ABNORMAL LOW (ref 13.0–17.0)
Immature Granulocytes: 1 %
Lymphocytes Relative: 12 %
Lymphs Abs: 1.5 10*3/uL (ref 0.7–4.0)
MCH: 27.2 pg (ref 26.0–34.0)
MCHC: 31.3 g/dL (ref 30.0–36.0)
MCV: 86.9 fL (ref 80.0–100.0)
Monocytes Absolute: 0.9 10*3/uL (ref 0.1–1.0)
Monocytes Relative: 7 %
Neutro Abs: 9.9 10*3/uL — ABNORMAL HIGH (ref 1.7–7.7)
Neutrophils Relative %: 78 %
Platelets: 376 10*3/uL (ref 150–400)
RBC: 3.75 MIL/uL — ABNORMAL LOW (ref 4.22–5.81)
RDW: 16.9 % — ABNORMAL HIGH (ref 11.5–15.5)
WBC: 12.7 10*3/uL — ABNORMAL HIGH (ref 4.0–10.5)
nRBC: 0 % (ref 0.0–0.2)

## 2022-02-02 LAB — GLUCOSE, CAPILLARY
Glucose-Capillary: 168 mg/dL — ABNORMAL HIGH (ref 70–99)
Glucose-Capillary: 148 mg/dL — ABNORMAL HIGH (ref 70–99)

## 2022-02-02 MED ORDER — HYDRALAZINE HCL 50 MG PO TABS: 50.0000 mg | ORAL_TABLET | Freq: Three times a day (TID) | ORAL | 6 refills | Status: DC

## 2022-02-02 MED ORDER — INSULIN DETEMIR 100 UNIT/ML ~~LOC~~ SOLN: 15.0000 [IU] | Freq: Every day | SUBCUTANEOUS | 11 refills | Status: AC

## 2022-02-02 MED ORDER — AMLODIPINE BESYLATE 10 MG PO TABS: 10.0000 mg | ORAL_TABLET | Freq: Every day | ORAL | 3 refills | Status: AC

## 2022-02-02 MED ORDER — LOSARTAN POTASSIUM 100 MG PO TABS: 100.0000 mg | ORAL_TABLET | Freq: Every evening | ORAL | 3 refills | Status: DC

## 2022-02-02 NOTE — TOC Progression Note (Addendum)
Transition of Care [facility]) - Progression Note    Patient Details  Name: Joseph Mcpherson MRN: [id number] Date of Birth: [birth year]  [facility] CM/SW Contact  Laurena Slimmer, RN Phone Number: 02/02/2022, 10:27 AM  Clinical Narrative:    Retrieved message from patient's primary nurse stating patient's wife  was requesting a phone call from this CM.  Called placed to patient's wife at [phone]. Patient's wife inquired about patient going home by EMS. Patient wife advised bill may be incurred and to follow up with insurance company. Patient's wife request her daughter,Dina by contacted.  Spoke with [name]. She confirmed she would like patient transported home by EMS. [name] would like to be called once EMS arrives to transport patient. CM advised Louann Sjogren would contact family regarding [place] scheduled for first visit.   Message sent to [facility] to advise patient is discharging today.   Patient's wife called back stating she would like to transport patient home. Contacted patient's daughter who stated patient is to be transported by EMS and that she would contact her mother to let her know.   [name] called back to confirm EMS was arranged and that family had been notified the patient would transport by EMS. TOC signing off.        Expected Discharge Plan: Skilled Nursing Facility Barriers to Discharge: Ship broker, Continued Medical Work up, SNF Pending bed offer  Expected Discharge Plan and Services Expected Discharge Plan: [place] Choice: [place] arrangements for the past 2 months: Single Family Home Expected Discharge Date: 02/02/22                                     Social Determinants of Health (SDOH) Interventions    Readmission Risk Interventions     No data to display

## 2022-02-02 NOTE — Discharge Instructions (Signed)
GB drain per instructions Keep log of your sugars at home and d/w PCP

## 2022-02-02 NOTE — Discharge Summary (Addendum)
Physician Discharge Summary   Patient: Joseph Mcpherson MRN: [id number] DOB: 06/13/1945  Admit date:     01/18/2022  Discharge date: 02/02/22  Discharge Physician: Fritzi Mandes   PCP: [name], MD   Recommendations at discharge:    F/u PCP in 1-2 weeks F/u Dr Hampton Abbot Gen surgery on the appt that will be set up Follow Interventional radiology instructions regarding GB drain  Discharge Diagnoses: Sepsis due to Acute Cholecystitis s/p GB drain placement  Hospital Course:  Joseph Mcpherson is a 76 y.o. 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     Acute cholecystitis S/p chole tube on 8/30 --dx by CT c/a/p.   --GenSurg consulted, rec chole tube --started on vanc/cefepime on presentation, then switched to ceftriaxone and flagyl on 8/30--now on IV Avycaz (total 7 days) --CT a/p w contrast 9/2 showed no abscess formation or other acute finding other than cholecystitis --chole tube care and flushes --outpatient surgical f/u in 3-4 weeks --Percutaneous cholecystostomy drain to remain in place at least 6 weeks. 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 Pt has 3 more doses left and given HD, IV access will give remainder of doses inhouse. Family aware. Last dose Monday 02/02/22   Leukocytosis  --repeat CT a/p w contrast 9/2 showed no abscess formation or other acute finding other than cholecystitis --WBC 34K--18K--17K--12K today   Sepsis --On admission patient noted to be febrile up to 101.4 F with mild tachypnea, and labs significant for WBC 34.6 with lactic acid reassuring at 1.  Source acute cholecystitis --sepsis resolved   Acute metabolic encephalopathy, improved --on presentation, somnolent, altered, due to infection.  --more alert and at baseline   Hyperkalemia due to missing  HD --  K 4.5   ESRD on HD MWF Patient will get HD now TTS -HD per nephrology   Weakness -- CT scan of the brain negative   --PT/OT rec SNF rehab--doing better--HHPT   Poor oral intake --per wife said pt eats little at baseline --Nepro TID   Essential hypertension --BP has been elevated on home BP meds --cont Toprol,hydralazine and losartan   Hyponatremia, acute --improving   Anemia chronic kidney disease  --hgb 9.4   Alzheimer's disease -Continue donepezil   Insulin-dependent diabetes mellitus type 2, uncontrolled With Hyperglycemia --A1c 8.1. --Home medication regimen includes Januvia 50 mg daily, NovoLog 10 units subcu nightly, and Levemir 20 units nightly. ---cont current meds and dosage indicated   Hypothyroidism -Continue levothyroxine   Mixed hyperlipidemia -Continue statin   History of CVA -Continue statin and aspirin   GERD -Continue Protonix     DVT prophylaxis: Heparin SQ Code Status: Full code  Family Communication: Spoke with wife    Level of care: Med-Surg Status is: Inpatient Remains inpatient appropriate because: Patient per PT ok with HHPT. Will d/c home once IV abx regimen is completed today EDD 9/11     Consultants: Gen surgery, IR, ID Procedures performed: GB drain placement  Disposition: Home Diet recommendation:  Discharge Diet Orders (From admission, onward)     Start     Ordered   02/02/22 0000  Diet - low sodium heart healthy        02/02/22 0923          Carb control Renal diet DISCHARGE MEDICATION: Allergies as of 02/02/2022   No Known  Allergies      Medication List     STOP taking these medications    insulin aspart 100 UNIT/ML FlexPen Commonly known as: NOVOLOG       TAKE these medications    albuterol (2.5 MG/3ML) 0.083% nebulizer solution Commonly known as: PROVENTIL Take 3 mLs (2.5 mg total) by nebulization every 6 (six) hours as needed for wheezing or shortness of breath.   amLODipine 10 MG  tablet Commonly known as: NORVASC Take 1 tablet (10 mg total) by mouth daily.   aspirin EC 81 MG tablet Take 81 mg by mouth daily. Swallow whole.   BD Pen Needle Nano 2nd Gen 32G X 4 MM Misc Generic drug: Insulin Pen Needle USE AS DIRECTED   calcium acetate 667 MG capsule Commonly known as: PHOSLO TAKE 2 CAPSULES BY MOUTH 3 TIMES A DAY WITH MEAL   docusate sodium 100 MG capsule Commonly known as: COLACE TAKE 1 CAPSULE BY MOUTH TWICE A DAY   donepezil 10 MG tablet Commonly known as: ARICEPT TAKE 1 TABLET BY MOUTH EVERY DAY   FreeStyle Libre 14 Day Sensor Misc APPLY EVERY 14 (FOURTEEN) DAYS.   gabapentin 300 MG capsule Commonly known as: NEURONTIN TAKE 1 CAPSULE BY MOUTH EVERY DAY AT BEDTIME AS DIRECTED   hydrALAZINE 50 MG tablet Commonly known as: APRESOLINE Take 1 tablet (50 mg total) by mouth 3 (three) times daily. What changed:  how much to take when to take this   insulin detemir 100 UNIT/ML injection Commonly known as: LEVEMIR Inject 0.15 mLs (15 Units total) into the skin at bedtime. What changed: how much to take   levocetirizine 5 MG tablet Commonly known as: XYZAL TAKE 1 TABLET BY MOUTH EVERY DAY IN THE EVENING   levothyroxine 137 MCG tablet Commonly known as: SYNTHROID TAKE 1 TABLET BY MOUTH EVERY DAY   losartan 100 MG tablet Commonly known as: COZAAR Take 1 tablet (100 mg total) by mouth every evening. What changed:  medication strength how much to take   metoprolol succinate 50 MG 24 hr tablet Commonly known as: TOPROL-XL TAKE 1 TABLET BY MOUTH EVERY DAY   multivitamin with minerals tablet Take 1 tablet by mouth daily.   pantoprazole 40 MG tablet Commonly known as: PROTONIX TAKE 1 TABLET BY MOUTH EVERY DAY   Rena-Vite Rx 1 MG Tabs Take 1 tablet by mouth daily.   sevelamer carbonate 2.4 g Pack Commonly known as: RENVELA Take 2.4 g by mouth daily.   simvastatin 20 MG tablet Commonly known as: ZOCOR TAKE 1 TABLET BY MOUTH EVERY DAY  IN THE EVENING   sitaGLIPtin 50 MG tablet Commonly known as: JANUVIA Take 50 mg by mouth every evening.   sodium chloride flush 0.9 % Soln Commonly known as: NS Inject 5 mLs into the vein daily. Flush cholecystostomy tube daily with 5 ccs of NS   Vitamin D3 125 MCG (5000 UT) Tabs Take 5,000 Units by mouth daily.               Durable Medical Equipment  (From admission, onward)           Start     Ordered   01/30/22 1033  For home use only DME Walker rolling  Once       Question Answer Comment  Walker: With [employer] Wheels   Patient needs a walker to treat with the following condition General weakness      01/30/22 1033   01/30/22 1033  For home use  only DME Bedside commode  Once       Question:  Patient needs a bedside commode to treat with the following condition  Answer:  General weakness   01/30/22 1033   01/30/22 1032  For home use only DME lightweight manual wheelchair with seat cushion  Once       Comments: Patient suffers from  which impairs their ability to perform daily activities like bathing, dressing, and grooming in the home.  A cane or walker will not resolve  issue with performing activities of daily living. A wheelchair will allow patient to safely perform daily activities. Patient is not able to propel themselves in the home using a standard weight wheelchair due to general weakness. Patient can self propel in the lightweight wheelchair. Length of need 6 months . Accessories: elevating leg rests (ELRs), wheel locks, extensions and anti-tippers.   01/30/22 1032   01/20/22 1143  For home use only DME Walker rolling  Once       Question Answer Comment  Walker: With [place]   Patient needs a walker to treat with the following condition General weakness      01/20/22 1142              Discharge Care Instructions  (From admission, onward)           Start     Ordered   02/02/22 0000  Discharge wound care:       Comments: GB drain care  per instructions   02/02/22 0923            Follow-up Information     Olean Ree, MD. Go on 03/09/2022.   Specialty: General Surgery Why: '@1'$ :30pm Contact information: [address] [place] [address] [phone]         [facility] Follow up.   Why: Please follow up with [facility] in approximately 6 weeks. A scheduler from our office will call you with a date/time of your appointment. Please call our office with any questions/concerns prior to your visit. Contact information: [place] [address] [phone]         [name], MD. Schedule an appointment as soon as possible for a visit in 1 week(s).   Specialties: Internal Medicine, Cardiology Why: hospital f/u Contact information: [place] [address] [phone]                Discharge Exam: Danley Danker Weights   01/30/22 0800 01/30/22 1225 01/31/22 0810  Weight: 67.6 kg 66.7 kg 67.1 kg     Condition at discharge: fair  The results of significant diagnostics from this hospitalization (including imaging, microbiology, ancillary and laboratory) are listed below for reference.   Imaging Studies: CT ABDOMEN PELVIS W CONTRAST  Result Date: 01/24/2022 CLINICAL DATA:  Persistent leukocytosis, cholecystitis status post percutaneous cholecystostomy, concern for abscess formation EXAM: CT ABDOMEN AND PELVIS WITH CONTRAST TECHNIQUE: Multidetector CT imaging of the abdomen and pelvis was performed using the standard protocol following bolus administration of intravenous contrast. RADIATION DOSE REDUCTION: This exam was performed according to the departmental dose-optimization program which includes automated exposure control, adjustment of the mA and/or kV according to patient size and/or use of iterative reconstruction technique. CONTRAST:  171m OMNIPAQUE IOHEXOL 300 MG/ML SOLN additional oral enteric contrast COMPARISON:   01/20/2022 FINDINGS: Lower chest: No acute abnormality.  Coronary artery calcifications. Hepatobiliary: No solid liver abnormality is seen. Status post percutaneous cholecystostomy tube placement,  formed pigtail in the gallbladder fundus. The gallbladder is decompressed with extensive wall thickening and pericholecystic fat stranding, however no discrete fluid collection is identified (series 5, image 38). Pancreas: Unremarkable. No pancreatic ductal dilatation or surrounding inflammatory changes. Spleen: Normal in size without significant abnormality. Adrenals/Urinary Tract: Adrenal glands are unremarkable. Mildly atrophic kidneys with multiple bilateral simple, benign renal cortical cysts, for which no further follow-up or characterization is required. Bladder is unremarkable. Stomach/Bowel: Stomach is within normal limits. Appendix appears normal. No evidence of bowel wall thickening, distention, or inflammatory changes. Vascular/Lymphatic: Aortic atherosclerosis. Extensive vascular calcinosis throughout the abdomen and pelvis. No enlarged abdominal or pelvic lymph nodes. Reproductive: No mass or other significant abnormality. Other: No abdominal wall hernia or abnormality. Anasarca. Trace perihepatic ascites. Musculoskeletal: No acute or significant osseous findings. IMPRESSION: 1. Status post percutaneous cholecystostomy tube placement, formed pigtail in the gallbladder fundus. The gallbladder is decompressed with extensive wall thickening and pericholecystic fat stranding, however no discrete fluid collection is identified. 2.  Trace perihepatic ascites.  Anasarca. 3.  Coronary artery disease. Aortic Atherosclerosis (ICD10-I70.0). Electronically Signed   By: Delanna Ahmadi M.D.   On: 01/24/2022 17:13   IR Perc Cholecystostomy  Result Date: 01/21/2022 INDICATION: 76 year old male with acute acalculous cholecystitis, poor surgical candidate. EXAM: Ultrasound and fluoroscopic guided percutaneous  cholecystostomy tube placement MEDICATIONS: The patient was receiving intravenous antibiotics as an inpatient. ANESTHESIA/SEDATION: Moderate (conscious) sedation was employed during this procedure. A total of Versed 0 mg and Fentanyl 25 mcg was administered intravenously. Moderate Sedation Time: 0 minutes. The patient's level of consciousness and vital signs were monitored continuously by radiology nursing throughout the procedure under my direct supervision. FLUOROSCOPY TIME:  14.4 mGy COMPLICATIONS: None immediate. PROCEDURE: Informed written consent was obtained from the patient after a thorough discussion of the procedural risks, benefits and alternatives. All questions were addressed. Maximal Sterile Barrier Technique was utilized including caps, mask, sterile gowns, sterile gloves, sterile drape, hand hygiene and skin antiseptic. A timeout was performed prior to the initiation of the procedure. The patient was placed supine on the angiographic table. The patient's right upper quadrant was then prepped and draped in normal sterile fashion with maximum sterile barrier. Ultrasound demonstrates a distended gallbladder. Subdermal Local anesthesia was provided at the planned skin entry site. Under ultrasound guidance, deeper local anesthetic was provided through intercostal muscles and along the liver capsule. Ultrasound was used to puncture the gallbladder using a 21 gauge Chiba needle via a transhepatic approach with visualization of the lung treated to the gallbladder. A 0.018 inch wire was advanced into the lumen and a transition dilator placed. A gentle hand injection of contrast was performed. Cholecystogram demonstrates the cystic duct is obstructed. A 0.035 inch exchange wire was placed in the tract was dilated. A 10.2 French multipurpose drainage catheter was advanced into the gallbladder lumen. The drain was then secured in place using a 0-silk suture and a Stayfix device. A sterile dressing was applied.  The tube was placed to bag drainage. A culture was sent to the lab for analysis. The patient tolerated procedure well without evidence of immediate complication was transferred back to the floor in stable condition. IMPRESSION: Successful placement of percutaneous, transhepatic cholecystostomy tube. Ruthann Cancer, MD [facility] Electronically Signed   By: Ruthann Cancer M.D.   On: 01/21/2022 16:08   CT CHEST ABDOMEN PELVIS W CONTRAST  Result Date: 01/20/2022 CLINICAL DATA:  Sepsis, fever of undetermined source. EXAM: CT CHEST, ABDOMEN, AND  PELVIS WITH CONTRAST TECHNIQUE: Multidetector CT imaging of the chest, abdomen and pelvis was performed following the standard protocol during bolus administration of intravenous contrast. RADIATION DOSE REDUCTION: This exam was performed according to the departmental dose-optimization program which includes automated exposure control, adjustment of the mA and/or kV according to patient size and/or use of iterative reconstruction technique. CONTRAST:  154m OMNIPAQUE IOHEXOL 300 MG/ML  SOLN COMPARISON:  None Available. FINDINGS: CT CHEST FINDINGS Cardiovascular: No significant vascular findings. Normal heart size. No pericardial effusion. Large-bore venous catheter in the RIGHT atrium Mediastinum/Nodes: No axillary or supraclavicular adenopathy. No mediastinal or hilar adenopathy. No pericardial fluid. Esophagus normal. Lungs/Pleura: No suspicious pulmonary nodules. Normal pleural. Airways normal. No pneumonia. Musculoskeletal: No aggressive osseous lesion. CT ABDOMEN AND PELVIS FINDINGS Hepatobiliary: The gallbladder wall is thickened and hazy. Small amount of pericholecystic fluid. There is irregular gallbladder wall enhancement. There is haziness within the peritoneal fat adjacent the gallbladder fundus. The gallbladder is mildly distended at 4.6 cm. No radiodense gallstones are evident. Common bile duct is normal  caliber. No intrahepatic duct dilatation. Pancreas: Pancreas is normal. No ductal dilatation. No pancreatic inflammation. Spleen: Normal spleen Adrenals/urinary tract: Adrenal glands normal. Bilateral renal cortical thinning. Benign-appearing cysts in the kidneys. Ureters bladder normal. The bladder is thick-walled but predominantly collapsed. Stomach/Bowel: Stomach, small bowel, appendix, and cecum are normal. The colon and rectosigmoid colon are normal. Vascular/Lymphatic: Abdominal aorta is normal caliber with atherosclerotic calcification. There is no retroperitoneal or periportal lymphadenopathy. No pelvic lymphadenopathy. Reproductive: Unremarkable Other: No free fluid. Musculoskeletal: No aggressive osseous lesion. IMPRESSION: Chest Impression: 1. No evidence of pulmonary infection. Abdomen / Pelvis Impression: 1. Gallbladder wall thickening with pericholecystic fluid and irregular gallbladder wall enhancement. Findings are concerning for ACUTE CHOLECYSTITIS. No radiodense gallstones are noted (potential acalculous cholecystitis versus non radiodense gallstones). 2. Bilateral renal cortical thinning.  Benign bilateral renal cysts. 3. Bladder wall thickening. These results will be called to the ordering clinician or representative by the Radiologist Assistant, and communication documented in the PACS or [employer] Electronically Signed   By: SSuzy BouchardM.D.   On: 01/20/2022 21:06   CT HEAD WO CONTRAST (5MM)  Result Date: 01/18/2022 CLINICAL DATA:  Altered mental status EXAM: CT HEAD WITHOUT CONTRAST TECHNIQUE: Contiguous axial images were obtained from the base of the skull through the vertex without intravenous contrast. RADIATION DOSE REDUCTION: This exam was performed according to the departmental dose-optimization program which includes automated exposure control, adjustment of the mA and/or kV according to patient size and/or use of iterative reconstruction technique. COMPARISON:   09/11/2013, MRI from 05/04/2019 FINDINGS: Brain: No evidence of acute infarction, hemorrhage, hydrocephalus, extra-axial collection or mass lesion/mass effect. Mild atrophic changes are noted. Changes of prior right occipital infarct are again seen. The previously seen ischemia in the posterior pons is not well appreciated on this exam. Vascular: No hyperdense vessel or unexpected calcification. Skull: Normal. Negative for fracture or focal lesion. Sinuses/Orbits: Orbits in the contents are within normal limits. Mucosal retention cysts are noted within the maxillary antra bilaterally. These are stable in appearance from the prior MRI. Other: None IMPRESSION: Chronic atrophic and ischemic changes without acute abnormality. Electronically Signed   By: MInez CatalinaM.D.   On: 01/18/2022 19:47   DG Chest Port 1 View  Result Date: 01/18/2022 CLINICAL DATA:  Questionable sepsis. EXAM: PORTABLE CHEST 1 VIEW COMPARISON:  May 04, 2019 FINDINGS: A double lumen catheter terminates in the right side of the heart. No pneumothorax. No overt edema.  No pulmonary nodules or masses. No focal infiltrate. The cardiac silhouette is prominent. The hila and mediastinum are unremarkable. IMPRESSION: 1. Double lumen right central line as above. 2. The cardiac silhouette is prominent but poorly evaluated on an AP portable film. 3. No focal infiltrate. Electronically Signed   By: Dorise Bullion III M.D.   On: 01/18/2022 12:16    Microbiology: Results for orders placed or performed during the hospital encounter of 01/18/22  Blood Culture (routine x 2)     Status: None   Collection Time: 01/18/22 11:51 AM   Specimen: BLOOD  Result Value Ref Range Status   Specimen Description BLOOD LEFT AC  Final   Special Requests   Final    BOTTLES DRAWN AEROBIC AND ANAEROBIC Blood Culture results may not be optimal due to an excessive volume of blood received in culture bottles   Culture   Final    NO GROWTH 5 DAYS Performed at  [facility], [address] [place], [place] [address]    Report Status 01/23/2022 FINAL  Final  Resp Panel by RT-PCR (Flu A&B, Covid) Anterior Nasal Swab     Status: None   Collection Time: 01/18/22 11:51 AM   Specimen: Anterior Nasal Swab  Result Value Ref Range Status   SARS Coronavirus 2 by RT PCR NEGATIVE NEGATIVE Final    Comment: (NOTE) SARS-CoV-2 target nucleic acids are NOT DETECTED.  The SARS-CoV-2 RNA is generally detectable in upper respiratory specimens during the acute phase of infection. The lowest concentration of SARS-CoV-2 viral copies this assay can detect is 138 copies/mL. A negative result does not preclude SARS-Cov-2 infection and should not be used as the sole basis for treatment or other patient management decisions. A negative result may occur with  improper specimen collection/handling, submission of specimen other than nasopharyngeal swab, presence of viral mutation(s) within the areas targeted by this assay, and inadequate number of viral copies(<138 copies/mL). A negative result must be combined with clinical observations, patient history, and epidemiological information. The expected result is Negative.  Fact Sheet for Patients:  EntrepreneurPulse.com.au  Fact Sheet for Healthcare Providers:  IncredibleEmployment.be  This test is no t yet approved or cleared by the [place] FDA and  has been authorized for detection and/or diagnosis of SARS-CoV-2 by FDA under an Emergency Use Authorization (EUA). This EUA will remain  in effect (meaning this test can be used) for the duration of the COVID-19 declaration under Section 564(b)(1) of the Act, 21 U.S.C.section 360bbb-3(b)(1), unless the authorization is terminated  or revoked sooner.       Influenza A by PCR NEGATIVE NEGATIVE Final   Influenza B by PCR NEGATIVE NEGATIVE Final    Comment: (NOTE) The Xpert Xpress SARS-CoV-2/FLU/RSV plus assay is  intended as an aid in the diagnosis of influenza from Nasopharyngeal swab specimens and should not be used as a sole basis for treatment. Nasal washings and aspirates are unacceptable for Xpert Xpress SARS-CoV-2/FLU/RSV testing.  Fact Sheet for Patients: EntrepreneurPulse.com.au  Fact Sheet for Healthcare Providers: IncredibleEmployment.be  This test is not yet approved or cleared by the [place] FDA and has been authorized for detection and/or diagnosis of SARS-CoV-2 by FDA under an Emergency Use Authorization (EUA). This EUA will remain in effect (meaning this test can be used) for the duration of the COVID-19 declaration under Section 564(b)(1) of the Act, 21 U.S.C. section 360bbb-3(b)(1), unless the authorization is terminated or revoked.  Performed at [facility], [address] [place],  [place] [address]   Blood Culture (routine x 2)     Status: None   Collection Time: 01/18/22  2:30 PM   Specimen: BLOOD  Result Value Ref Range Status   Specimen Description BLOOD RIGHT ARM  Final   Special Requests   Final    BOTTLES DRAWN AEROBIC AND ANAEROBIC Blood Culture adequate volume   Culture   Final    NO GROWTH 5 DAYS Performed at [facility], [address] [place], [place] [address]    Report Status 01/27/2022 FINAL  Final  Aerobic/Anaerobic Culture w Gram Stain (surgical/deep wound)     Status: None (Preliminary result)   Collection Time: 01/21/22  3:30 PM   Specimen: Abscess  Result Value Ref Range Status   Specimen Description   Final    ABSCESS GALL BLADDER Performed at [facility], [address], [place], [place] [address]    Special Requests   Final    NONE Performed at [facility], [address], [place], [place] [address]    Gram Stain   Final    FEW WBC PRESENT,BOTH PMN AND MONONUCLEAR RARE GRAM POSITIVE COCCI IN PAIRS    Culture   Final    FEW ESCHERICHIA  COLI MULTI-DRUG RESISTANT ORGANISM CRITICAL RESULT CALLED TO, READ BACK BY AND VERIFIED WITH: PHARMD K. Dante [phone] FCP MODERATE STREPTOCOCCUS GALLOLYTICUS NO ANAEROBES ISOLATED Sent to [place] for further susceptibility testing. Performed at [facility], [place] [address], [place], [place] [address]    Report Status PENDING  Incomplete   Organism ID, Bacteria ESCHERICHIA COLI  Final   Organism ID, Bacteria STREPTOCOCCUS GALLOLYTICUS  Final      Susceptibility   Escherichia coli - MIC*    AMPICILLIN >=32 RESISTANT Resistant     CEFAZOLIN >=64 RESISTANT Resistant     CEFEPIME 4 INTERMEDIATE Intermediate     CEFTAZIDIME >=64 RESISTANT Resistant     CEFTRIAXONE >=64 RESISTANT Resistant     CIPROFLOXACIN >=4 RESISTANT Resistant     GENTAMICIN <=1 SENSITIVE Sensitive     IMIPENEM RESISTANT Resistant     TRIMETH/SULFA <=20 SENSITIVE Sensitive     AMPICILLIN/SULBACTAM >=32 RESISTANT Resistant     PIP/TAZO 64 INTERMEDIATE Intermediate     * FEW ESCHERICHIA COLI   Streptococcus gallolyticus - MIC*    PENICILLIN 0.12 SENSITIVE Sensitive     CEFTRIAXONE <=0.12 SENSITIVE Sensitive     ERYTHROMYCIN >=8 RESISTANT Resistant     LEVOFLOXACIN >=16 RESISTANT Resistant     VANCOMYCIN 0.25 SENSITIVE Sensitive     * MODERATE STREPTOCOCCUS GALLOLYTICUS  Carbapenem Resistance Panel     Status: None   Collection Time: 01/21/22  3:30 PM  Result Value Ref Range Status   Carba Resistance IMP Gene NOT DETECTED NOT DETECTED Final   Carba Resistance VIM Gene NOT DETECTED NOT DETECTED Final   Carba Resistance NDM Gene NOT DETECTED NOT DETECTED Final   Carba Resistance KPC Gene NOT DETECTED NOT DETECTED Final   Carba Resistance OXA48 Gene NOT DETECTED NOT DETECTED Final    Comment: (NOTE) Cepheid Carba-R is an FDA-cleared nucleic acid amplification test  (NAAT)for the detection and differentiation of genes encoding the  most prevalent carbapenemases in bacterial isolate samples. Carbapenemase  gene identification and implementation of comprehensive  infection control measures are recommended by the CDC to prevent the  spread of the resistant organisms. Performed at [facility], [place] [address] [place], [place] [address]   Susceptibility, Aer +  Anaerob     Status: Abnormal   Collection Time: 01/21/22  3:30 PM  Result Value Ref Range Status   Suscept, Aer + Anaerob Preliminary report (A)  Final    Comment: (NOTE) Performed At: [facility] [place], [place] [id number] Rush Farmer MD [id number]    Source of Sample   Final    [phone] [place] SUSCEPTIBILITY TESTING [facility] ABSCESS GALL BLADDER    Comment: Performed at [facility], [place] [address] [place], [place] [address]  Susceptibility Result     Status: Abnormal   Collection Time: 01/21/22  3:30 PM  Result Value Ref Range Status   Suscept Result 1 Escherichia coli (A)  Final    Comment: (NOTE) Identification performed by account, not confirmed by this laboratory. Performed At: [facility] [address], [place] [id number] Rush Farmer MD [id number]     Labs: CBC: Recent Labs  Lab 01/27/22 [phone] 01/28/22 0547 01/30/22 0756 01/30/22 1323 02/02/22 0745  WBC 24.2* 18.0* 17.4* 18.4* 12.7*  NEUTROABS  --   --  14.5*  --  9.9*  HGB 9.6* 9.4* 9.6* 10.2* 10.2*  HCT 31.0* 29.4* 30.9* 34.4* 32.6*  MCV 85.6 87.0 87.8 90.3 86.9  PLT 447* 424* 440* 524* 016   Basic Metabolic Panel: Recent Labs  Lab 01/27/22 0638 01/28/22 0547 01/30/22 1323  NA 132* 131* 133*  K 4.3 4.5 4.6  CL 93* 94* 96*  CO2 '25 25 24  '$ GLUCOSE 101* 72 179*  BUN 23 30* 22  CREATININE 5.46* 6.91* 7.00*  CALCIUM 9.1 8.7* 9.1  MG 1.8 1.9  --   PHOS  --   --  4.3   Liver Function Tests: Recent Labs  Lab 01/30/22 1323  ALBUMIN 2.9*   CBG: Recent Labs  Lab 01/31/22 2129 02/01/22 0733 02/01/22 1216 02/01/22 2024 02/02/22 0835  GLUCAP 150* 177* 156* 198* 168*    Discharge  time spent: greater than 30 minutes.  Signed: Fritzi Mandes, MD [facility] 02/02/2022

## 2022-02-02 NOTE — Progress Notes (Signed)
[facility]  ROUNDING NOTE   Subjective:   Patient seen sitting up in chair, wife at bedside Alert and oriented Reports improved pain management Patient states he feels stronger today Appetite improving, denies nausea and vomiting Remains on room air No lower extremity edema   Objective:  Vital signs in last 24 hours:  Temp:  [98.2 F (36.8 C)-98.8 F (37.1 C)] 98.2 F (36.8 C) (09/11 0834) Pulse Rate:  [58-71] 58 (09/11 0834) Resp:  [16-18] 17 (09/11 0834) BP: (135-148)/(55-62) 147/60 (09/11 0834) SpO2:  [99 %-100 %] 99 % (09/11 0834)  Weight change:  Filed Weights   01/30/22 0800 01/30/22 1225 01/31/22 0810  Weight: 67.6 kg 66.7 kg 67.1 kg    Intake/Output: I/O last 3 completed shifts: In: 63 [P.O.:240; I.V.:15; Other:10; IV Piggyback:100] Out: 390 [Drains:390]   Intake/Output this shift:  Total I/O In: 46.1 [IV Piggyback:46.1] Out: -   Physical Exam: General: NAD  Head: Normocephalic, atraumatic. Moist oral mucosal membranes  Eyes: Anicteric  Lungs:  Clear to auscultation, normal effort, room air  Heart: Regular rate and rhythm  Abdomen:  Soft, non-tender, nondistended  Extremities: No peripheral edema.  Neurologic: Nonfocal, moving all four extremities  Skin: No lesions  Access: Right chest PermCath    Basic Metabolic Panel: Recent Labs  Lab 01/27/22 0638 01/28/22 0547 01/30/22 1323  NA 132* 131* 133*  K 4.3 4.5 4.6  CL 93* 94* 96*  CO2 '25 25 24  '$ GLUCOSE 101* 72 179*  BUN 23 30* 22  CREATININE 5.46* 6.91* 7.00*  CALCIUM 9.1 8.7* 9.1  MG 1.8 1.9  --   PHOS  --   --  4.3     Liver Function Tests: Recent Labs  Lab 01/30/22 1323  ALBUMIN 2.9*    No results for input(s): "LIPASE", "AMYLASE" in the last 168 hours. No results for input(s): "AMMONIA" in the last 168 hours.  CBC: Recent Labs  Lab 01/27/22 0638 01/28/22 0547 01/30/22 0756 01/30/22 1323 02/02/22 0745  WBC 24.2* 18.0* 17.4* 18.4* 12.7*  NEUTROABS  --    --  14.5*  --  9.9*  HGB 9.6* 9.4* 9.6* 10.2* 10.2*  HCT 31.0* 29.4* 30.9* 34.4* 32.6*  MCV 85.6 87.0 87.8 90.3 86.9  PLT 447* 424* 440* 524* 376     Cardiac Enzymes: No results for input(s): "CKTOTAL", "CKMB", "CKMBINDEX", "TROPONINI" in the last 168 hours.  BNP: Invalid input(s): "POCBNP"  CBG: Recent Labs  Lab 02/01/22 0733 02/01/22 1216 02/01/22 2024 02/02/22 0835 02/02/22 1147  GLUCAP 177* 156* 198* 168* 148*     Microbiology: Results for orders placed or performed during the hospital encounter of 01/18/22  Blood Culture (routine x 2)     Status: None   Collection Time: 01/18/22 11:51 AM   Specimen: BLOOD  Result Value Ref Range Status   Specimen Description BLOOD LEFT AC  Final   Special Requests   Final    BOTTLES DRAWN AEROBIC AND ANAEROBIC Blood Culture results may not be optimal due to an excessive volume of blood received in culture bottles   Culture   Final    NO GROWTH 5 DAYS Performed at [facility], [address] [place], [place] [address]    Report Status 01/23/2022 FINAL  Final  Resp Panel by RT-PCR (Flu A&B, Covid) Anterior Nasal Swab     Status: None   Collection Time: 01/18/22 11:51 AM   Specimen: Anterior Nasal Swab  Result Value Ref Range Status   SARS  Coronavirus 2 by RT PCR NEGATIVE NEGATIVE Final    Comment: (NOTE) SARS-CoV-2 target nucleic acids are NOT DETECTED.  The SARS-CoV-2 RNA is generally detectable in upper respiratory specimens during the acute phase of infection. The lowest concentration of SARS-CoV-2 viral copies this assay can detect is 138 copies/mL. A negative result does not preclude SARS-Cov-2 infection and should not be used as the sole basis for treatment or other patient management decisions. A negative result may occur with  improper specimen collection/handling, submission of specimen other than nasopharyngeal swab, presence of viral mutation(s) within the areas targeted by this assay, and inadequate  number of viral copies(<138 copies/mL). A negative result must be combined with clinical observations, patient history, and epidemiological information. The expected result is Negative.  Fact Sheet for Patients:  EntrepreneurPulse.com.au  Fact Sheet for Healthcare Providers:  IncredibleEmployment.be  This test is no t yet approved or cleared by the [place] FDA and  has been authorized for detection and/or diagnosis of SARS-CoV-2 by FDA under an Emergency Use Authorization (EUA). This EUA will remain  in effect (meaning this test can be used) for the duration of the COVID-19 declaration under Section 564(b)(1) of the Act, 21 U.S.C.section 360bbb-3(b)(1), unless the authorization is terminated  or revoked sooner.       Influenza A by PCR NEGATIVE NEGATIVE Final   Influenza B by PCR NEGATIVE NEGATIVE Final    Comment: (NOTE) The Xpert Xpress SARS-CoV-2/FLU/RSV plus assay is intended as an aid in the diagnosis of influenza from Nasopharyngeal swab specimens and should not be used as a sole basis for treatment. Nasal washings and aspirates are unacceptable for Xpert Xpress SARS-CoV-2/FLU/RSV testing.  Fact Sheet for Patients: EntrepreneurPulse.com.au  Fact Sheet for Healthcare Providers: IncredibleEmployment.be  This test is not yet approved or cleared by the [place] FDA and has been authorized for detection and/or diagnosis of SARS-CoV-2 by FDA under an Emergency Use Authorization (EUA). This EUA will remain in effect (meaning this test can be used) for the duration of the COVID-19 declaration under Section 564(b)(1) of the Act, 21 U.S.C. section 360bbb-3(b)(1), unless the authorization is terminated or revoked.  Performed at [facility], [address] [place], [place] [address]   Blood Culture (routine x 2)     Status: None   Collection Time: 01/18/22  2:30 PM   Specimen: BLOOD   Result Value Ref Range Status   Specimen Description BLOOD RIGHT ARM  Final   Special Requests   Final    BOTTLES DRAWN AEROBIC AND ANAEROBIC Blood Culture adequate volume   Culture   Final    NO GROWTH 5 DAYS Performed at [facility], [address] [place], [place] [address]    Report Status 01/27/2022 FINAL  Final  Aerobic/Anaerobic Culture w Gram Stain (surgical/deep wound)     Status: None (Preliminary result)   Collection Time: 01/21/22  3:30 PM   Specimen: Abscess  Result Value Ref Range Status   Specimen Description   Final    ABSCESS GALL BLADDER Performed at [facility], [address], [place],  [address]    Special Requests   Final    NONE Performed at [facility], [address] [place], [place] [address]    Gram Stain   Final    FEW WBC PRESENT,BOTH PMN AND MONONUCLEAR RARE GRAM POSITIVE COCCI IN PAIRS    Culture   Final    FEW ESCHERICHIA COLI MULTI-DRUG RESISTANT ORGANISM CRITICAL RESULT CALLED TO,  READ BACK BY AND VERIFIED WITH: PHARMD K. Acree [phone] FCP MODERATE STREPTOCOCCUS GALLOLYTICUS NO ANAEROBES ISOLATED Sent to [place] for further susceptibility testing. Performed at [facility], [place] [address] [place], [place] [address]    Report Status PENDING  Incomplete   Organism ID, Bacteria ESCHERICHIA COLI  Final   Organism ID, Bacteria STREPTOCOCCUS GALLOLYTICUS  Final      Susceptibility   Escherichia coli - MIC*    AMPICILLIN >=32 RESISTANT Resistant     CEFAZOLIN >=64 RESISTANT Resistant     CEFEPIME 4 INTERMEDIATE Intermediate     CEFTAZIDIME >=64 RESISTANT Resistant     CEFTRIAXONE >=64 RESISTANT Resistant     CIPROFLOXACIN >=4 RESISTANT Resistant     GENTAMICIN <=1 SENSITIVE Sensitive     IMIPENEM RESISTANT Resistant     TRIMETH/SULFA <=20 SENSITIVE Sensitive     AMPICILLIN/SULBACTAM >=32 RESISTANT Resistant     PIP/TAZO 64 INTERMEDIATE Intermediate     * FEW ESCHERICHIA COLI    Streptococcus gallolyticus - MIC*    PENICILLIN 0.12 SENSITIVE Sensitive     CEFTRIAXONE <=0.12 SENSITIVE Sensitive     ERYTHROMYCIN >=8 RESISTANT Resistant     LEVOFLOXACIN >=16 RESISTANT Resistant     VANCOMYCIN 0.25 SENSITIVE Sensitive     * MODERATE STREPTOCOCCUS GALLOLYTICUS  Carbapenem Resistance Panel     Status: None   Collection Time: 01/21/22  3:30 PM  Result Value Ref Range Status   Carba Resistance IMP Gene NOT DETECTED NOT DETECTED Final   Carba Resistance VIM Gene NOT DETECTED NOT DETECTED Final   Carba Resistance NDM Gene NOT DETECTED NOT DETECTED Final   Carba Resistance KPC Gene NOT DETECTED NOT DETECTED Final   Carba Resistance OXA48 Gene NOT DETECTED NOT DETECTED Final    Comment: (NOTE) Cepheid Carba-R is an FDA-cleared nucleic acid amplification test  (NAAT)for the detection and differentiation of genes encoding the  most prevalent carbapenemases in bacterial isolate samples. Carbapenemase gene identification and implementation of comprehensive  infection control measures are recommended by the CDC to prevent the  spread of the resistant organisms. Performed at [facility], [place] [address] [place], [place] [address]   Susceptibility, Aer + Anaerob     Status: Abnormal   Collection Time: 01/21/22  3:30 PM  Result Value Ref Range Status   Suscept, Aer + Anaerob Preliminary report (A)  Final    Comment: (NOTE) Performed At: [facility] [place], [place] [id number] Rush Farmer MD [id number]    Source of Sample   Final    [id number] [place] SUSCEPTIBILITY TESTING [facility] ABSCESS GALL BLADDER    Comment: Performed at [facility], [place] [address], [place], [place] [address]  Susceptibility Result     Status: Abnormal   Collection Time: 01/21/22  3:30 PM  Result Value Ref Range Status   Suscept Result 1 Escherichia coli (A)  Final    Comment: (NOTE) Identification performed by account, not confirmed by this laboratory. Performed  At: [facility] [place], [place] [id number] Rush Farmer MD [id number]     Coagulation Studies: No results for input(s): "LABPROT", "INR" in the last 72 hours.   Urinalysis: No results for input(s): "COLORURINE", "LABSPEC", "PHURINE", "GLUCOSEU", "HGBUR", "BILIRUBINUR", "KETONESUR", "PROTEINUR", "UROBILINOGEN", "NITRITE", "LEUKOCYTESUR" in the last 72 hours.  Invalid input(s): "APPERANCEUR"    Imaging: No results found.   Medications:    ceftazidime-avibactam (AVYCAZ) 0.94 g in dextrose 5 % 50 mL IVPB 25 mL/hr  at 02/02/22 1127    amLODipine  10 mg Oral Daily   aspirin EC  81 mg Oral Daily   Chlorhexidine Gluconate Cloth  6 each Topical Q0600   donepezil  10 mg Oral Daily   feeding supplement (NEPRO CARB STEADY)  237 mL Oral TID BM   gabapentin  300 mg Oral QHS   heparin  5,000 Units Subcutaneous Q8H   hydrALAZINE  100 mg Oral Q8H   insulin aspart  0-15 Units Subcutaneous TID WC   insulin detemir  10 Units Subcutaneous QHS   levothyroxine  137 mcg Oral Q0600   loratadine  10 mg Oral QPM   losartan  100 mg Oral QPM   metoprolol succinate  50 mg Oral Daily   metroNIDAZOLE  500 mg Oral Q12H   pantoprazole  40 mg Oral Daily   simvastatin  20 mg Oral q1800   sodium chloride flush  3 mL Intravenous Q12H   sodium chloride flush  5 mL Intracatheter Q8H   acetaminophen **OR** acetaminophen, albuterol, alum & mag hydroxide-simeth, naphazoline-glycerin  Assessment/ Plan:  [name] is a 76 y.o.  male with a past medical history of hypertension, end-stage renal disease, on hemodialysis on Monday Wednesday Friday schedule, diabetes mellitus, CVA who was brought to the ER with chief complaint of weakness.  [facility]/TTS/right chest PermCath  End-stage renal disease on hemodialysis.    Patient received dialysis on Tuesday to accommodate new outpatient dialysis schedule of TTS.  If discharged today, patient will resume outpatient  treatments tomorrow at assigned dialysis clinic.  Patient and family aware of the new dialysis schedule.  2. Anemia of chronic kidney disease  Lab Results  Component Value Date   HGB 10.2 (L) 02/02/2022    Hemoglobin within desired target.   3. Secondary Hyperparathyroidism: with outpatient labs: PTH 422, phosphorus 6.4, calcium 9.1 on 12/29/2021.   Lab Results  Component Value Date   CALCIUM 9.1 01/30/2022   CAION 0.83 (LL) 12/10/2020   PHOS 4.3 01/30/2022    Calcium and phosphorus within acceptable range  4.  Hypertension with chronic kidney disease.  Home regimen includes hydralazine and losartan.  Currently receiving these medications along with metoprolol.   5. Diabetes mellitus type II with chronic kidney disease insulin dependent. Home regimen includes NovoLog and Levemir. Most recent hemoglobin A1c is 8.1 on 01/18/2022.  Currently prescribed Januvia outpatient.  Currently held.  Sliding scale insulin managed per primary team.  6.  Acute cholecystitis seen on CT.  Surgery consulted.  IR placed percutaneous drain on 01/21/22 and stabilizing with medications.  Plan for percutaneous drain for 6 weeks.  Completed antibiotic regimen of Flagyl and Avycaz.    LOS: [place] 9/11/20232:21 PM

## 2022-02-02 NOTE — Care Management Important Message (Signed)
Important Message  Patient Details  Name: Joseph Mcpherson MRN: [id number] Date of Birth: 04-Aug-1945   Medicare Important Message Given:  Yes     Loann Quill 02/02/2022, 3:33 PM

## 2022-02-03 ENCOUNTER — Telehealth: Payer: Self-pay | Admitting: *Deleted

## 2022-02-03 DIAGNOSIS — R6889 Other general symptoms and signs: Secondary | ICD-10-CM | POA: Diagnosis not present

## 2022-02-03 DIAGNOSIS — Z992 Dependence on renal dialysis: Secondary | ICD-10-CM | POA: Diagnosis not present

## 2022-02-03 DIAGNOSIS — N2581 Secondary hyperparathyroidism of renal origin: Secondary | ICD-10-CM | POA: Diagnosis not present

## 2022-02-03 DIAGNOSIS — N186 End stage renal disease: Secondary | ICD-10-CM | POA: Diagnosis not present

## 2022-02-03 NOTE — Patient Outreach (Signed)
  Care Coordination [facility] Note Transition Care Management Unsuccessful Follow-up Telephone Call  Date of discharge and from where:  [phone] [facility]  Attempts:  1st Attempt  Reason for unsuccessful TCM follow-up call:  Left voice message  [place] Care Management [phone]

## 2022-02-04 ENCOUNTER — Telehealth: Payer: Self-pay | Admitting: *Deleted

## 2022-02-04 DIAGNOSIS — I120 Hypertensive chronic kidney disease with stage 5 chronic kidney disease or end stage renal disease: Secondary | ICD-10-CM | POA: Diagnosis not present

## 2022-02-04 DIAGNOSIS — Z4803 Encounter for change or removal of drains: Secondary | ICD-10-CM | POA: Diagnosis not present

## 2022-02-04 DIAGNOSIS — G9341 Metabolic encephalopathy: Secondary | ICD-10-CM | POA: Diagnosis not present

## 2022-02-04 DIAGNOSIS — N186 End stage renal disease: Secondary | ICD-10-CM | POA: Diagnosis not present

## 2022-02-04 DIAGNOSIS — D631 Anemia in chronic kidney disease: Secondary | ICD-10-CM | POA: Diagnosis not present

## 2022-02-04 DIAGNOSIS — I2510 Atherosclerotic heart disease of native coronary artery without angina pectoris: Secondary | ICD-10-CM | POA: Diagnosis not present

## 2022-02-04 DIAGNOSIS — I69311 Memory deficit following cerebral infarction: Secondary | ICD-10-CM | POA: Diagnosis not present

## 2022-02-04 DIAGNOSIS — E1122 Type 2 diabetes mellitus with diabetic chronic kidney disease: Secondary | ICD-10-CM | POA: Diagnosis not present

## 2022-02-04 DIAGNOSIS — I700 Atherosclerosis of aorta: Secondary | ICD-10-CM | POA: Diagnosis not present

## 2022-02-04 LAB — SUSCEPTIBILITY, AER + ANAEROB: Source of Sample: 88013

## 2022-02-04 LAB — SUSCEPTIBILITY RESULT

## 2022-02-04 NOTE — Patient Outreach (Signed)
  Care Coordination [facility] Note Transition Care Management Follow-up Telephone Call Date of discharge and from where: [facility] [phone] How have you been since you were released from the hospital? ok Any questions or concerns? No  Items Reviewed: Did the pt receive and understand the discharge instructions provided? Yes  Medications obtained and verified? Yes  Other? No  Any new allergies since your discharge? No  Dietary orders reviewed? Yes Do you have support at home? Yes   Home Care and Equipment/Supplies: Were home health services ordered? no If so, what is the name of the agency? N/a  Has the agency set up a time to come to the patient's home? not applicable Were any new equipment or medical supplies ordered?  Yes:   What is the name of the medical supply agency? [facility] Were you able to get the supplies/equipment? yes Do you have any questions related to the use of the equipment or supplies? No  Functional Questionnaire: (I = Independent and D = Dependent) ADLs: I  Bathing/Dressing- I  Meal Prep- D  Eating- I  Maintaining continence- I  Transferring/Ambulation- I  Managing Meds- I  Follow up appointments reviewed:  PCP Hospital f/u appt confirmed? Yes  Scheduled to see Dr Lavera Guise [phone] [facility] f/u appt confirmed? Yes  Scheduled to see Dr Hampton Abbot [phone] 1:30 Are transportation arrangements needed? No  If their condition worsens, is the pt aware to call PCP or go to the Emergency Dept.? Yes Was the patient provided with contact information for the PCP's office or ED? Yes Was to pt encouraged to call back with questions or concerns? Yes  SDOH assessments and interventions completed:   Yes  Care Coordination Interventions Activated:  Yes   Care Coordination Interventions:   N/A     Encounter Outcome:  Pt. Visit Completed   [facility] [phone]

## 2022-02-05 DIAGNOSIS — N186 End stage renal disease: Secondary | ICD-10-CM | POA: Diagnosis not present

## 2022-02-05 DIAGNOSIS — N2581 Secondary hyperparathyroidism of renal origin: Secondary | ICD-10-CM | POA: Diagnosis not present

## 2022-02-05 DIAGNOSIS — Z992 Dependence on renal dialysis: Secondary | ICD-10-CM | POA: Diagnosis not present

## 2022-02-05 LAB — MIC RESULTS (2 DRUGS)

## 2022-02-07 DIAGNOSIS — N2581 Secondary hyperparathyroidism of renal origin: Secondary | ICD-10-CM | POA: Diagnosis not present

## 2022-02-07 DIAGNOSIS — Z992 Dependence on renal dialysis: Secondary | ICD-10-CM | POA: Diagnosis not present

## 2022-02-07 DIAGNOSIS — N186 End stage renal disease: Secondary | ICD-10-CM | POA: Diagnosis not present

## 2022-02-10 DIAGNOSIS — N186 End stage renal disease: Secondary | ICD-10-CM | POA: Diagnosis not present

## 2022-02-10 DIAGNOSIS — Z992 Dependence on renal dialysis: Secondary | ICD-10-CM | POA: Diagnosis not present

## 2022-02-10 DIAGNOSIS — N2581 Secondary hyperparathyroidism of renal origin: Secondary | ICD-10-CM | POA: Diagnosis not present

## 2022-02-11 ENCOUNTER — Ambulatory Visit (INDEPENDENT_AMBULATORY_CARE_PROVIDER_SITE_OTHER): Payer: Medicare HMO | Admitting: Internal Medicine

## 2022-02-11 ENCOUNTER — Encounter: Payer: Self-pay | Admitting: Internal Medicine

## 2022-02-11 VITALS — BP 153/69 | Ht 64.0 in

## 2022-02-11 DIAGNOSIS — I10 Essential (primary) hypertension: Secondary | ICD-10-CM | POA: Diagnosis not present

## 2022-02-11 DIAGNOSIS — E039 Hypothyroidism, unspecified: Secondary | ICD-10-CM

## 2022-02-11 DIAGNOSIS — K2100 Gastro-esophageal reflux disease with esophagitis, without bleeding: Secondary | ICD-10-CM

## 2022-02-11 DIAGNOSIS — N186 End stage renal disease: Secondary | ICD-10-CM

## 2022-02-11 DIAGNOSIS — K810 Acute cholecystitis: Secondary | ICD-10-CM

## 2022-02-11 NOTE — Progress Notes (Signed)
Established Patient Office Visit  Subjective:  Patient ID: Joseph Mcpherson, male    DOB: 16-Mar-1946  Age: 76 y.o. MRN: [id number]  CC:  Chief Complaint  Patient presents with   Hospitalization Follow-up    Patient was admitted on 01/18/22 and discharged on 02/02/22    HPI  Joseph Mcpherson presents for checkup, pt has infected gall bladder Past Medical History:  Diagnosis Date   Clotting disorder [facility])    per daughter, "when patient travels, his feet swell"   Diabetes mellitus without complication ([place])    type II   Diabetic eye exam ([place]) 12/2019   ESRD (end stage renal disease) ([place])    MWF- [place]   GERD (gastroesophageal reflux disease)    [name] (hard of hearing)    Hyperlipidemia    Hypertension    Hypothyroidism    Thyroid disease     Past Surgical History:  Procedure Laterality Date   AV FISTULA PLACEMENT Right 10/22/2017   Procedure: ARTERIOVENOUS (AV) BRACHIOCEPHALIC FISTULA CREATION RIGHT UPPER ARM;  Surgeon: Conrad [place], MD;  Location: [place];  Service: Vascular;  Laterality: Right;   AV FISTULA PLACEMENT Left 02/03/2019   Procedure: INSERTION OF ARTERIOVENOUS (AV) GORE-TEX GRAFT ARM (BRACHIAL AXILLARY );  Surgeon: [name], MD;  Location: [facility];  Service: Vascular;  Laterality: Left;   AV FISTULA PLACEMENT Left 12/10/2020   Procedure: LEFT ARM BRACHIOCEPHALIC ARTERIOVENOUS (AV) FISTULA CREATION;  Surgeon: [name], MD;  Location: [place];  Service: Vascular;  Laterality: Left;   IR PERC CHOLECYSTOSTOMY  01/21/2022   UPPER EXTREMITY ANGIOGRAPHY Left 09/19/2019   Procedure: UPPER EXTREMITY ANGIOGRAPHY;  Surgeon: [name], MD;  Location: [place] [facility];  Service: Cardiovascular;  Laterality: Left;    Family History  Problem Relation Age of Onset   Diabetes Mother    Hyperlipidemia Mother    Hypertension Father     Social History   Socioeconomic History   Marital status: Married    Spouse name: Not on file   Number of children:  Not on file   Years of education: 12   Highest education level: 12th grade  Occupational History   Occupation: Retired  Tobacco Use   Smoking status: Never   Smokeless tobacco: Current    Types: Snuff  Vaping Use   Vaping Use: Never used  Substance and Sexual Activity   Alcohol use: No   Drug use: No   Sexual activity: Not Currently  Other Topics Concern   Not on file  Social History Narrative   Not on file   Social Determinants of Health   Financial Resource Strain: Low Risk  (06/19/2021)   Overall Financial Resource Strain (CARDIA)    Difficulty of Paying Living Expenses: Not hard at all  Food Insecurity: No Food Insecurity (01/31/2022)   Hunger Vital Sign    Worried About Running Out of Food in the Last Year: Never true    [place] in the Last Year: Never true  Transportation Needs: No Transportation Needs (01/31/2022)   PRAPARE - Hydrologist (Medical): No    Lack of Transportation (Non-Medical): No  Physical Activity: Sufficiently Active (06/19/2021)   Exercise Vital Sign    Days of Exercise per Week: 7 days    Minutes of Exercise per Session: 30 min  Stress: No Stress Concern Present (06/19/2021)   [place]    Feeling of Stress :  Not at all  Social Connections: Socially Integrated (06/19/2021)   Social Connection and Isolation Panel [NHANES]    Frequency of Communication with Friends and Family: More than three times a week    Frequency of Social Gatherings with Friends and Family: More than three times a week    Attends Religious Services: More than 4 times per year    Active Member of [employer] or Organizations: Yes    Attends Music therapist: More than 4 times per year    Marital Status: Married  Human resources officer Violence: Not At Risk (01/31/2022)   Humiliation, Afraid, Rape, and Kick questionnaire    Fear of Current or Ex-Partner: No    Emotionally  Abused: No    Physically Abused: No    Sexually Abused: No     Current Outpatient Medications:    albuterol (PROVENTIL) (2.5 MG/3ML) 0.083% nebulizer solution, Take 3 mLs (2.5 mg total) by nebulization every 6 (six) hours as needed for wheezing or shortness of breath., Disp: 150 mL, Rfl: 1   amLODipine (NORVASC) 10 MG tablet, Take 1 tablet (10 mg total) by mouth daily., Disp: 30 tablet, Rfl: 3   aspirin EC 81 MG tablet, Take 81 mg by mouth daily. Swallow whole., Disp: , Rfl:    B Complex-C-Folic Acid (RENA-VITE RX) 1 MG TABS, Take 1 tablet by mouth daily. , Disp: , Rfl:    BD PEN NEEDLE NANO 2ND GEN 32G X 4 MM MISC, USE AS DIRECTED, Disp: 300 each, Rfl: 3   calcium acetate (PHOSLO) 667 MG capsule, TAKE 2 CAPSULES BY MOUTH 3 TIMES A DAY WITH MEAL, Disp: 180 capsule, Rfl: 4   Cholecalciferol (VITAMIN D3) 5000 units TABS, Take 5,000 Units by mouth daily. , Disp: , Rfl:    Continuous Blood Gluc Sensor (FREESTYLE LIBRE 14 DAY SENSOR) MISC, APPLY EVERY 14 (FOURTEEN) DAYS., Disp: 90 each, Rfl: 1   docusate sodium (COLACE) 100 MG capsule, TAKE 1 CAPSULE BY MOUTH TWICE A DAY, Disp: 60 capsule, Rfl: 1   donepezil (ARICEPT) 10 MG tablet, TAKE 1 TABLET BY MOUTH EVERY DAY, Disp: 90 tablet, Rfl: 3   gabapentin (NEURONTIN) 300 MG capsule, TAKE 1 CAPSULE BY MOUTH EVERY DAY AT BEDTIME AS DIRECTED, Disp: 90 capsule, Rfl: 3   hydrALAZINE (APRESOLINE) 50 MG tablet, Take 1 tablet (50 mg total) by mouth 3 (three) times daily., Disp: 90 tablet, Rfl: 6   insulin detemir (LEVEMIR) 100 UNIT/ML injection, Inject 0.15 mLs (15 Units total) into the skin at bedtime., Disp: 10 mL, Rfl: 11   levocetirizine (XYZAL) 5 MG tablet, TAKE 1 TABLET BY MOUTH EVERY DAY IN THE EVENING, Disp: 90 tablet, Rfl: 1   levothyroxine (SYNTHROID) 137 MCG tablet, TAKE 1 TABLET BY MOUTH EVERY DAY, Disp: 90 tablet, Rfl: 3   losartan (COZAAR) 100 MG tablet, Take 1 tablet (100 mg total) by mouth every evening., Disp: 30 tablet, Rfl: 3   metoprolol  succinate (TOPROL-XL) 50 MG 24 hr tablet, TAKE 1 TABLET BY MOUTH EVERY DAY, Disp: 90 tablet, Rfl: 3   Multiple Vitamins-Minerals (MULTIVITAMIN WITH MINERALS) tablet, Take 1 tablet by mouth daily., Disp: , Rfl:    pantoprazole (PROTONIX) 40 MG tablet, TAKE 1 TABLET BY MOUTH EVERY DAY, Disp: 90 tablet, Rfl: 3   sevelamer carbonate (RENVELA) 2.4 g PACK, Take 2.4 g by mouth daily., Disp: , Rfl:    simvastatin (ZOCOR) 20 MG tablet, TAKE 1 TABLET BY MOUTH EVERY DAY IN THE EVENING, Disp: 90 tablet, Rfl: 3  sitaGLIPtin (JANUVIA) 50 MG tablet, Take 50 mg by mouth every evening. , Disp: , Rfl:    sodium chloride flush (NS) 0.9 % SOLN, Inject 5 mLs into the vein daily. Flush cholecystostomy tube daily with 5 ccs of NS, Disp: 300 mL, Rfl: 0   No Known Allergies  ROS Review of Systems  Constitutional: Negative.   HENT: Negative.    Eyes: Negative.   Respiratory: Negative.    Cardiovascular: Negative.   Gastrointestinal: Negative.        Drain  in gall bladder  Endocrine: Negative.   Genitourinary: Negative.        ESRD on dialysis  Musculoskeletal: Negative.   Skin: Negative.   Allergic/Immunologic: Negative.   Neurological: Negative.   Hematological: Negative.   Psychiatric/Behavioral: Negative.    All other systems reviewed and are negative.     Objective:    Physical Exam Cardiovascular:     Rate and Rhythm: Regular rhythm.  Pulmonary:     Effort: Pulmonary effort is normal.     Breath sounds: Normal breath sounds.  Abdominal:     General: Bowel sounds are normal.     Palpations: Abdomen is soft.     BP (!) 165/60   Ht '5\' 4"'$  (1.626 m)   BMI 25.39 kg/m  Wt Readings from Last 3 Encounters:  01/31/22 147 lb 14.9 oz (67.1 kg)  08/18/21 145 lb 9.6 oz (66 kg)  06/16/21 149 lb 14.4 oz (68 kg)     Health Maintenance Due  Topic Date Due   OPHTHALMOLOGY EXAM  Never done   Hepatitis C Screening  Never done   TETANUS/TDAP  Never done   FOOT EXAM  03/08/2020   COVID-19  Vaccine (6 - Pfizer series) 07/31/2021   INFLUENZA VACCINE  12/23/2021    There are no preventive care reminders to display for this patient.  Lab Results  Component Value Date   TSH 0.088 (L) 01/18/2022   Lab Results  Component Value Date   WBC 12.7 (H) 02/02/2022   HGB 10.2 (L) 02/02/2022   HCT 32.6 (L) 02/02/2022   MCV 86.9 02/02/2022   PLT 376 02/02/2022   Lab Results  Component Value Date   NA 133 (L) 01/30/2022   K 4.6 01/30/2022   CO2 24 01/30/2022   GLUCOSE 179 (H) 01/30/2022   BUN 22 01/30/2022   CREATININE 7.00 (H) 01/30/2022   BILITOT 0.6 01/20/2022   ALKPHOS 92 01/20/2022   AST 30 01/20/2022   ALT 21 01/20/2022   PROT 7.1 01/20/2022   ALBUMIN 2.9 (L) 01/30/2022   CALCIUM 9.1 01/30/2022   ANIONGAP 13 01/30/2022   Lab Results  Component Value Date   CHOL 144 05/05/2019   Lab Results  Component Value Date   HDL 29 (L) 05/05/2019   Lab Results  Component Value Date   LDLCALC 55 05/05/2019   Lab Results  Component Value Date   TRIG 301 (H) 05/05/2019   Lab Results  Component Value Date   CHOLHDL 5.0 05/05/2019   Lab Results  Component Value Date   HGBA1C 8.1 (H) 01/18/2022      Assessment & Plan:   Problem List Items Addressed This Visit       Cardiovascular and Mediastinum   Essential hypertension - Primary    Blood pressure is under control        Digestive   Gastroesophageal reflux disease with esophagitis    Under control      Cholecystitis, acute  Refer to the surgeon        Endocrine   Hypothyroidism    Under control        Genitourinary   End stage renal disease [facility])    Patient is on dialysis       No orders of the defined types were placed in this encounter.   Follow-up: No follow-ups on file.    [name], MD

## 2022-02-11 NOTE — Assessment & Plan Note (Signed)
Refer to the surgeon

## 2022-02-11 NOTE — Assessment & Plan Note (Signed)
Under control 

## 2022-02-11 NOTE — Assessment & Plan Note (Signed)
Blood pressure is under control 

## 2022-02-11 NOTE — Assessment & Plan Note (Signed)
Patient is on dialysis

## 2022-02-12 DIAGNOSIS — N2581 Secondary hyperparathyroidism of renal origin: Secondary | ICD-10-CM | POA: Diagnosis not present

## 2022-02-12 DIAGNOSIS — N186 End stage renal disease: Secondary | ICD-10-CM | POA: Diagnosis not present

## 2022-02-12 DIAGNOSIS — Z992 Dependence on renal dialysis: Secondary | ICD-10-CM | POA: Diagnosis not present

## 2022-02-14 DIAGNOSIS — N186 End stage renal disease: Secondary | ICD-10-CM | POA: Diagnosis not present

## 2022-02-14 DIAGNOSIS — Z992 Dependence on renal dialysis: Secondary | ICD-10-CM | POA: Diagnosis not present

## 2022-02-14 DIAGNOSIS — N2581 Secondary hyperparathyroidism of renal origin: Secondary | ICD-10-CM | POA: Diagnosis not present

## 2022-02-15 ENCOUNTER — Other Ambulatory Visit: Payer: Self-pay | Admitting: Internal Medicine

## 2022-02-17 DIAGNOSIS — N186 End stage renal disease: Secondary | ICD-10-CM | POA: Diagnosis not present

## 2022-02-17 DIAGNOSIS — N2581 Secondary hyperparathyroidism of renal origin: Secondary | ICD-10-CM | POA: Diagnosis not present

## 2022-02-17 DIAGNOSIS — Z992 Dependence on renal dialysis: Secondary | ICD-10-CM | POA: Diagnosis not present

## 2022-02-19 DIAGNOSIS — Z992 Dependence on renal dialysis: Secondary | ICD-10-CM | POA: Diagnosis not present

## 2022-02-19 DIAGNOSIS — N2581 Secondary hyperparathyroidism of renal origin: Secondary | ICD-10-CM | POA: Diagnosis not present

## 2022-02-19 DIAGNOSIS — N186 End stage renal disease: Secondary | ICD-10-CM | POA: Diagnosis not present

## 2022-02-19 LAB — AEROBIC/ANAEROBIC CULTURE W GRAM STAIN (SURGICAL/DEEP WOUND)

## 2022-02-21 DIAGNOSIS — E1122 Type 2 diabetes mellitus with diabetic chronic kidney disease: Secondary | ICD-10-CM | POA: Diagnosis not present

## 2022-02-21 DIAGNOSIS — N2581 Secondary hyperparathyroidism of renal origin: Secondary | ICD-10-CM | POA: Diagnosis not present

## 2022-02-21 DIAGNOSIS — Z992 Dependence on renal dialysis: Secondary | ICD-10-CM | POA: Diagnosis not present

## 2022-02-21 DIAGNOSIS — N186 End stage renal disease: Secondary | ICD-10-CM | POA: Diagnosis not present

## 2022-02-24 DIAGNOSIS — Z992 Dependence on renal dialysis: Secondary | ICD-10-CM | POA: Diagnosis not present

## 2022-02-24 DIAGNOSIS — N2581 Secondary hyperparathyroidism of renal origin: Secondary | ICD-10-CM | POA: Diagnosis not present

## 2022-02-24 DIAGNOSIS — N186 End stage renal disease: Secondary | ICD-10-CM | POA: Diagnosis not present

## 2022-02-25 ENCOUNTER — Encounter: Payer: Self-pay | Admitting: Internal Medicine

## 2022-02-25 ENCOUNTER — Ambulatory Visit (INDEPENDENT_AMBULATORY_CARE_PROVIDER_SITE_OTHER): Payer: Medicare HMO | Admitting: Internal Medicine

## 2022-02-25 VITALS — BP 136/67 | HR 70 | Temp 98.4°F | Ht 64.0 in | Wt 146.5 lb

## 2022-02-25 DIAGNOSIS — I10 Essential (primary) hypertension: Secondary | ICD-10-CM | POA: Diagnosis not present

## 2022-02-25 DIAGNOSIS — J209 Acute bronchitis, unspecified: Secondary | ICD-10-CM

## 2022-02-25 DIAGNOSIS — E782 Mixed hyperlipidemia: Secondary | ICD-10-CM | POA: Diagnosis not present

## 2022-02-25 DIAGNOSIS — Z794 Long term (current) use of insulin: Secondary | ICD-10-CM | POA: Diagnosis not present

## 2022-02-25 DIAGNOSIS — G301 Alzheimer's disease with late onset: Secondary | ICD-10-CM | POA: Diagnosis not present

## 2022-02-25 DIAGNOSIS — E039 Hypothyroidism, unspecified: Secondary | ICD-10-CM

## 2022-02-25 DIAGNOSIS — E1140 Type 2 diabetes mellitus with diabetic neuropathy, unspecified: Secondary | ICD-10-CM

## 2022-02-25 DIAGNOSIS — R531 Weakness: Secondary | ICD-10-CM

## 2022-02-25 DIAGNOSIS — J219 Acute bronchiolitis, unspecified: Secondary | ICD-10-CM

## 2022-02-25 DIAGNOSIS — I739 Peripheral vascular disease, unspecified: Secondary | ICD-10-CM

## 2022-02-25 LAB — GLUCOSE, POCT (MANUAL RESULT ENTRY): POC Glucose: 97 mg/dl (ref 70–99)

## 2022-02-25 MED ORDER — CHLORPROMAZINE HCL 25 MG PO TABS: 25.0000 mg | ORAL_TABLET | Freq: Three times a day (TID) | ORAL | 1 refills | Status: DC

## 2022-02-25 MED ORDER — SEVELAMER CARBONATE 2.4 G PO PACK: 2.4000 g | PACK | Freq: Every day | ORAL | 3 refills | Status: DC

## 2022-02-25 NOTE — Progress Notes (Signed)
Established Patient Office Visit  Subjective:  Patient ID: Joseph Mcpherson, male    DOB: Oct 04, 1945  Age: 76 y.o. MRN: [id number]  CC:  Chief Complaint  Patient presents with   Fatigue    HPI  Joseph Mcpherson presents for bronchitis  Past Medical History:  Diagnosis Date   Clotting disorder ([place])    per daughter, "when patient travels, his feet swell"   Diabetes mellitus without complication ([place])    type II   Diabetic eye exam ([place]) 12/2019   ESRD (end stage renal disease) ([place])    MWF- [place]   GERD (gastroesophageal reflux disease)    [name] (hard of hearing)    Hyperlipidemia    Hypertension    Hypothyroidism    Thyroid disease     Past Surgical History:  Procedure Laterality Date   AV FISTULA PLACEMENT Right 10/22/2017   Procedure: ARTERIOVENOUS (AV) BRACHIOCEPHALIC FISTULA CREATION RIGHT UPPER ARM;  Surgeon: Conrad , MD;  Location: [place];  Service: Vascular;  Laterality: Right;   AV FISTULA PLACEMENT Left 02/03/2019   Procedure: INSERTION OF ARTERIOVENOUS (AV) GORE-TEX GRAFT ARM (BRACHIAL AXILLARY );  Surgeon: [name], MD;  Location: [facility];  Service: Vascular;  Laterality: Left;   AV FISTULA PLACEMENT Left 12/10/2020   Procedure: LEFT ARM BRACHIOCEPHALIC ARTERIOVENOUS (AV) FISTULA CREATION;  Surgeon: [name], MD;  Location: [place];  Service: Vascular;  Laterality: Left;   IR CHOLANGIOGRAM EXISTING TUBE  03/18/2022   IR PERC CHOLECYSTOSTOMY  01/21/2022   UPPER EXTREMITY ANGIOGRAPHY Left 09/19/2019   Procedure: UPPER EXTREMITY ANGIOGRAPHY;  Surgeon: [name], MD;  Location: [place] [facility];  Service: Cardiovascular;  Laterality: Left;    Family History  Problem Relation Age of Onset   Diabetes Mother    Hyperlipidemia Mother    Hypertension Father     Social History   Socioeconomic History   Marital status: Married    Spouse name: Not on file   Number of children: Not on file   Years of education: 12   Highest education  level: 12th grade  Occupational History   Occupation: Retired  Tobacco Use   Smoking status: Never   Smokeless tobacco: Current    Types: Snuff  Vaping Use   Vaping Use: Never used  Substance and Sexual Activity   Alcohol use: No   Drug use: No   Sexual activity: Not Currently  Other Topics Concern   Not on file  Social History Narrative   Not on file   Social Determinants of Health   Financial Resource Strain: Low Risk  (06/19/2021)   Overall Financial Resource Strain (CARDIA)    Difficulty of Paying Living Expenses: Not hard at all  Food Insecurity: No Food Insecurity (03/22/2022)   Hunger Vital Sign    Worried About Running Out of Food in the Last Year: Never true    [place] in the Last Year: Never true  Transportation Needs: No Transportation Needs (03/22/2022)   PRAPARE - Hydrologist (Medical): No    Lack of Transportation (Non-Medical): No  Physical Activity: Sufficiently Active (06/19/2021)   Exercise Vital Sign    Days of Exercise per Week: 7 days    Minutes of Exercise per Session: 30 min  Stress: No Stress Concern Present (06/19/2021)   [place]    Feeling of Stress : Not at all  Social Connections: [place] (  06/19/2021)   Social Connection and Isolation Panel [NHANES]    Frequency of Communication with Friends and Family: More than three times a week    Frequency of Social Gatherings with Friends and Family: More than three times a week    Attends Religious Services: More than 4 times per year    Active Member of [employer] or Organizations: Yes    Attends Music therapist: More than 4 times per year    Marital Status: Married  Human resources officer Violence: Not At Risk (03/22/2022)   Humiliation, Afraid, Rape, and Kick questionnaire    Fear of Current or Ex-Partner: No    Emotionally Abused: No    Physically Abused: No    Sexually Abused:  No    No current facility-administered medications for this visit. No current outpatient medications on file.  Facility-Administered Medications Ordered in Other Visits:    0.9 %  sodium chloride infusion, 250 mL, Intravenous, PRN, Agbata, Tochukwu, MD   acetaminophen (TYLENOL) tablet 650 mg, 650 mg, Oral, Q6H PRN **OR** acetaminophen (TYLENOL) suppository 650 mg, 650 mg, Rectal, Q6H PRN, Agbata, Tochukwu, MD   albuterol (VENTOLIN HFA) 108 (90 Base) MCG/ACT inhaler 2 puff, 2 puff, Inhalation, Q4H PRN, Agbata, Tochukwu, MD, 2 puff at 03/24/22 1353   alteplase (CATHFLO ACTIVASE) injection 2 mg, 2 mg, Intracatheter, Once PRN, Colon Flattery, NP   amLODipine (NORVASC) tablet 10 mg, 10 mg, Oral, Daily, Agbata, Tochukwu, MD, 10 mg at 03/23/22 3664   anticoagulant sodium citrate solution 5 mL, 5 mL, Intracatheter, PRN, Gwyneth Revels, Shantelle, NP   ascorbic acid (VITAMIN C) tablet 500 mg, 500 mg, Oral, Daily, Agbata, Tochukwu, MD, 500 mg at 03/24/22 0815   aspirin EC tablet 81 mg, 81 mg, Oral, Daily, Agbata, Tochukwu, MD, 81 mg at 03/24/22 0815   atorvastatin (LIPITOR) tablet 40 mg, 40 mg, Oral, Daily, Agbata, Tochukwu, MD, 40 mg at 03/24/22 0814   azithromycin (ZITHROMAX) tablet 250 mg, 250 mg, Oral, Daily, Berton Mount, RPH, 250 mg at 03/24/22 0815   calcium acetate (PHOSLO) capsule 1,334 mg, 1,334 mg, Oral, TID WC, Agbata, Tochukwu, MD, 1,334 mg at 03/24/22 1157   cefdinir (OMNICEF) capsule 300 mg, 300 mg, Oral, Daily, Berton Mount, RPH, 300 mg at 03/24/22 0815   Chlorhexidine Gluconate Cloth 2 % PADS 6 each, 6 each, Topical, Daily, Agbata, Tochukwu, MD   chlorproMAZINE (THORAZINE) tablet 25 mg, 25 mg, Oral, TID, Agbata, Tochukwu, MD, 25 mg at 03/24/22 0816   cholecalciferol (VITAMIN D3) 25 MCG (1000 UNIT) tablet 5,000 Units, 5,000 Units, Oral, Daily, Agbata, Tochukwu, MD, 5,000 Units at 03/24/22 0814   clopidogrel (PLAVIX) tablet 75 mg, 75 mg, Oral, Daily, Agbata, Tochukwu, MD, 75 mg at  03/24/22 0815   donepezil (ARICEPT) tablet 10 mg, 10 mg, Oral, QHS, Agbata, Tochukwu, MD, 10 mg at 03/23/22 2211   gabapentin (NEURONTIN) capsule 300 mg, 300 mg, Oral, QHS, Agbata, Tochukwu, MD, 300 mg at 03/23/22 2211   guaiFENesin (ROBITUSSIN) 100 MG/5ML liquid 5 mL, 5 mL, Oral, Q4H PRN, Agbata, Tochukwu, MD, 5 mL at 03/24/22 0053   heparin injection 1,000 Units, 1,000 Units, Intracatheter, PRN, Colon Flattery, NP   heparin injection 5,000 Units, 5,000 Units, Subcutaneous, Q8H, [place], Richard, MD, 5,000 Units at 03/24/22 1354   heparin sodium (porcine) 1000 UNIT/ML injection, , , ,    insulin aspart (novoLOG) injection 0-6 Units, 0-6 Units, Subcutaneous, TID WC, Agbata, Tochukwu, MD, 2 Units at 03/24/22 0814   insulin aspart (novoLOG) injection 3 Units,  3 Units, Subcutaneous, TID WC, Wiet[place], Richard, MD   insulin detemir (LEVEMIR) injection 15 Units, 15 Units, Subcutaneous, QHS, Agbata, Tochukwu, MD, 15 Units at 03/23/22 2212   levothyroxine (SYNTHROID) tablet 137 mcg, 137 mcg, Oral, Q0600, Agbata, Tochukwu, MD, 137 mcg at 03/24/22 0521   lidocaine (PF) (XYLOCAINE) 1 % injection 5 mL, 5 mL, Intradermal, PRN, Breeze, Shantelle, NP   lidocaine-prilocaine (EMLA) cream 1 Application, 1 Application, Topical, PRN, Colon Flattery, NP   losartan (COZAAR) tablet 100 mg, 100 mg, Oral, QPM, Agbata, Tochukwu, MD, 100 mg at 03/23/22 1822   methylPREDNISolone sodium succinate (SOLU-MEDROL) 40 mg/mL injection 33.2 mg, 0.5 mg/kg, Intravenous, Q12H, 33.2 mg at 03/24/22 0409 **FOLLOWED BY** [START ON 03/25/2022] predniSONE (DELTASONE) tablet 50 mg, 50 mg, Oral, Daily, Agbata, Tochukwu, MD   metoprolol succinate (TOPROL-XL) 24 hr tablet 50 mg, 50 mg, Oral, Daily, Agbata, Tochukwu, MD, 50 mg at 03/23/22 0830   ondansetron (ZOFRAN) tablet 4 mg, 4 mg, Oral, Q6H PRN **OR** ondansetron (ZOFRAN) injection 4 mg, 4 mg, Intravenous, Q6H PRN, Agbata, Tochukwu, MD   pantoprazole (PROTONIX) EC tablet 40 mg, 40 mg, Oral,  Daily, Agbata, Tochukwu, MD, 40 mg at 03/24/22 0815   pentafluoroprop-tetrafluoroeth (GEBAUERS) aerosol 1 Application, 1 Application, Topical, PRN, Colon Flattery, NP   [COMPLETED] remdesivir 200 mg in sodium chloride 0.9% 250 mL IVPB, 200 mg, Intravenous, Once, Stopped at 03/22/22 1843 **FOLLOWED BY** remdesivir 100 mg in sodium chloride 0.9 % 100 mL IVPB, 100 mg, Intravenous, q1800, Agbata, Tochukwu, MD, Last Rate: 200 mL/hr at 03/23/22 1824, 100 mg at 03/23/22 1824   sodium chloride flush (NS) 0.9 % injection 3 mL, 3 mL, Intravenous, Q12H, Agbata, Tochukwu, MD, 3 mL at 03/24/22 1157   sodium chloride flush (NS) 0.9 % injection 3 mL, 3 mL, Intravenous, PRN, Agbata, Tochukwu, MD   sodium zirconium cyclosilicate (LOKELMA) packet 10 g, 10 g, Oral, Daily, W[place], Richard, MD, 10 g at 03/24/22 0816   zinc sulfate capsule 220 mg, 220 mg, Oral, Daily, Agbata, Tochukwu, MD, 220 mg at 03/24/22 0815   No Known Allergies  ROS Review of Systems  Constitutional: Negative.   HENT: Negative.    Eyes: Negative.   Respiratory: Negative.    Cardiovascular: Negative.   Gastrointestinal: Negative.   Endocrine: Negative.   Genitourinary: Negative.   Musculoskeletal: Negative.   Skin: Negative.   Allergic/Immunologic: Negative.   Neurological: Negative.   Hematological: Negative.   Psychiatric/Behavioral: Negative.    All other systems reviewed and are negative.     Objective:    Physical Exam Vitals reviewed.  Constitutional:      Appearance: Normal appearance.  HENT:     Mouth/Throat:     Mouth: Mucous membranes are moist.  Eyes:     Pupils: Pupils are equal, round, and reactive to light.  Neck:     Vascular: No carotid bruit.  Cardiovascular:     Rate and Rhythm: Normal rate and regular rhythm.     Pulses: Normal pulses.     Heart sounds: Normal heart sounds.  Pulmonary:     Effort: Pulmonary effort is normal.     Breath sounds: Normal breath sounds.  Abdominal:     General:  Bowel sounds are normal.     Palpations: Abdomen is soft. There is no hepatomegaly, splenomegaly or mass.     Tenderness: There is no abdominal tenderness.     Hernia: No hernia is present.  Musculoskeletal:     Cervical back: Neck supple.  Right lower leg: No edema.     Left lower leg: No edema.  Skin:    Findings: No rash.  Neurological:     Mental Status: He is alert and oriented to person, place, and time.     Motor: No weakness.  Psychiatric:        Mood and Affect: Mood normal.        Behavior: Behavior normal.     BP 136/67   Pulse 70   Temp 98.4 F (36.9 C)   Ht 5' 4"  (1.626 m)   Wt 146 lb 8 oz (66.5 kg)   BMI 25.15 kg/m  Wt Readings from Last 3 Encounters:  03/24/22 156 lb 12 oz (71.1 kg)  03/20/22 145 lb 6.4 oz (66 kg)  03/12/22 145 lb 11.6 oz (66.1 kg)     Health Maintenance Due  Topic Date Due   OPHTHALMOLOGY EXAM  Never done   TETANUS/TDAP  Never done   FOOT EXAM  03/08/2020   COVID-19 Vaccine (6 - Pfizer series) 07/31/2021   INFLUENZA VACCINE  12/23/2021    There are no preventive care reminders to display for this patient.  Lab Results  Component Value Date   TSH 0.088 (L) 01/18/2022   Lab Results  Component Value Date   WBC 6.6 03/23/2022   HGB 9.9 (L) 03/23/2022   HCT 32.5 (L) 03/23/2022   MCV 87.4 03/23/2022   PLT 249 03/23/2022   Lab Results  Component Value Date   NA 135 03/24/2022   K 5.7 (H) 03/24/2022   CO2 25 03/24/2022   GLUCOSE 295 (H) 03/24/2022   BUN 43 (H) 03/24/2022   CREATININE 8.71 (H) 03/24/2022   BILITOT 0.6 03/24/2022   ALKPHOS 95 03/24/2022   AST 26 03/24/2022   ALT 22 03/24/2022   PROT 6.3 (L) 03/24/2022   ALBUMIN 2.8 (L) 03/24/2022   CALCIUM 8.7 (L) 03/24/2022   ANIONGAP 10 03/24/2022   EGFR 8 (L) 02/25/2022   Lab Results  Component Value Date   CHOL 144 05/05/2019   Lab Results  Component Value Date   HDL 29 (L) 05/05/2019   Lab Results  Component Value Date   LDLCALC 55 05/05/2019   Lab  Results  Component Value Date   TRIG 301 (H) 05/05/2019   Lab Results  Component Value Date   CHOLHDL 5.0 05/05/2019   Lab Results  Component Value Date   HGBA1C 8.1 (H) 01/18/2022      Assessment & Plan:   Problem List Items Addressed This Visit       Cardiovascular and Mediastinum   Essential hypertension     Patient denies any chest pain or shortness of breath there is no history of palpitation or paroxysmal nocturnal dyspnea   patient was advised to follow low-salt low-cholesterol diet    ideally I want to keep systolic blood pressure below 130 mmHg, patient was asked to check blood pressure one times a week and give me a report on that.  Patient will be follow-up in 3 months  or earlier as needed, patient will call me back for any change in the cardiovascular symptoms Patient was advised to buy a book from local bookstore concerning blood pressure and read several chapters  every day.  This will be supplemented by some of the material we will give him from the office.  Patient should also utilize other resources like YouTube and Internet to learn more about the blood pressure and the diet.  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[place])    Refer to neurology        Other   Mixed hyperlipidemia    Hypercholesterolemia  I advised the patient to follow Mediterranean diet This diet is rich in fruits vegetables and whole grain, and This diet is also rich in fish and lean meat Patient should also eat a handful of almonds or walnuts daily Recent heart study indicated that average follow-up on this kind of diet reduces the  cardiovascular mortality by 50 to 70%==      Other Visit Diagnoses     Weakness    -  Primary   Relevant Orders   CBC with Differential/Platelet (Completed)   COMPLETE METABOLIC PANEL WITH GFR (Completed)   EKG 12-Lead       Meds ordered this encounter  Medications   DISCONTD: chlorproMAZINE (THORAZINE) 25 MG tablet    Sig: Take 1 tablet (25 mg total) by mouth 3 (three) times daily.    Dispense:  90 tablet    Refill:  1   sevelamer carbonate (RENVELA) 2.4 g PACK    Sig: Take 2.4 g by mouth daily.    Dispense:  90 each    Refill:  3    Follow-up: No follow-ups on file.    [name], MD

## 2022-02-26 ENCOUNTER — Other Ambulatory Visit: Payer: Self-pay | Admitting: Internal Medicine

## 2022-02-26 DIAGNOSIS — N186 End stage renal disease: Secondary | ICD-10-CM | POA: Diagnosis not present

## 2022-02-26 DIAGNOSIS — N2581 Secondary hyperparathyroidism of renal origin: Secondary | ICD-10-CM | POA: Diagnosis not present

## 2022-02-26 DIAGNOSIS — Z992 Dependence on renal dialysis: Secondary | ICD-10-CM | POA: Diagnosis not present

## 2022-02-26 LAB — CBC WITH DIFFERENTIAL/PLATELET
Absolute Monocytes: 959 cells/uL — ABNORMAL HIGH (ref 200–950)
Basophils Absolute: 69 cells/uL (ref 0–200)
Basophils Relative: 0.5 %
Eosinophils Absolute: 411 cells/uL (ref 15–500)
Eosinophils Relative: 3 %
HCT: 30.2 % — ABNORMAL LOW (ref 38.5–50.0)
Hemoglobin: 9.7 g/dL — ABNORMAL LOW (ref 13.2–17.1)
Lymphs Abs: 1863 cells/uL (ref 850–3900)
MCH: 27.3 pg (ref 27.0–33.0)
MCHC: 32.1 g/dL (ref 32.0–36.0)
MCV: 85.1 fL (ref 80.0–100.0)
MPV: 11.4 fL (ref 7.5–12.5)
Monocytes Relative: 7 %
Neutro Abs: 10398 cells/uL — ABNORMAL HIGH (ref 1500–7800)
Neutrophils Relative %: 75.9 %
Platelets: 242 10*3/uL (ref 140–400)
RBC: 3.55 10*6/uL — ABNORMAL LOW (ref 4.20–5.80)
RDW: 16 % — ABNORMAL HIGH (ref 11.0–15.0)
Total Lymphocyte: 13.6 %
WBC: 13.7 10*3/uL — ABNORMAL HIGH (ref 3.8–10.8)

## 2022-02-26 LAB — COMPLETE METABOLIC PANEL WITH GFR
AG Ratio: 1.3 (calc) (ref 1.0–2.5)
ALT: 15 U/L (ref 9–46)
AST: 20 U/L (ref 10–35)
Albumin: 3.6 g/dL (ref 3.6–5.1)
Alkaline phosphatase (APISO): 118 U/L (ref 35–144)
BUN/Creatinine Ratio: 4 (calc) — ABNORMAL LOW (ref 6–22)
BUN: 30 mg/dL — ABNORMAL HIGH (ref 7–25)
CO2: 23 mmol/L (ref 20–32)
Calcium: 8.7 mg/dL (ref 8.6–10.3)
Chloride: 94 mmol/L — ABNORMAL LOW (ref 98–110)
Creat: 7.01 mg/dL — ABNORMAL HIGH (ref 0.70–1.28)
Globulin: 2.8 g/dL (calc) (ref 1.9–3.7)
Glucose, Bld: 68 mg/dL (ref 65–99)
Potassium: 3.8 mmol/L (ref 3.5–5.3)
Sodium: 135 mmol/L (ref 135–146)
Total Bilirubin: 0.5 mg/dL (ref 0.2–1.2)
Total Protein: 6.4 g/dL (ref 6.1–8.1)
eGFR: 8 mL/min/{1.73_m2} — ABNORMAL LOW (ref >=60)

## 2022-02-27 ENCOUNTER — Other Ambulatory Visit: Payer: Self-pay | Admitting: Internal Medicine

## 2022-02-27 DIAGNOSIS — R531 Weakness: Secondary | ICD-10-CM

## 2022-02-28 DIAGNOSIS — N2581 Secondary hyperparathyroidism of renal origin: Secondary | ICD-10-CM | POA: Diagnosis not present

## 2022-02-28 DIAGNOSIS — N186 End stage renal disease: Secondary | ICD-10-CM | POA: Diagnosis not present

## 2022-02-28 DIAGNOSIS — Z992 Dependence on renal dialysis: Secondary | ICD-10-CM | POA: Diagnosis not present

## 2022-03-03 DIAGNOSIS — N2581 Secondary hyperparathyroidism of renal origin: Secondary | ICD-10-CM | POA: Diagnosis not present

## 2022-03-03 DIAGNOSIS — Z992 Dependence on renal dialysis: Secondary | ICD-10-CM | POA: Diagnosis not present

## 2022-03-03 DIAGNOSIS — N186 End stage renal disease: Secondary | ICD-10-CM | POA: Diagnosis not present

## 2022-03-05 ENCOUNTER — Other Ambulatory Visit: Payer: Self-pay | Admitting: *Deleted

## 2022-03-05 DIAGNOSIS — N2581 Secondary hyperparathyroidism of renal origin: Secondary | ICD-10-CM | POA: Diagnosis not present

## 2022-03-05 DIAGNOSIS — N186 End stage renal disease: Secondary | ICD-10-CM | POA: Diagnosis not present

## 2022-03-05 DIAGNOSIS — Z992 Dependence on renal dialysis: Secondary | ICD-10-CM | POA: Diagnosis not present

## 2022-03-05 MED ORDER — IPRATROPIUM-ALBUTEROL 0.5-2.5 (3) MG/3ML IN SOLN: 3.0000 mL | Freq: Four times a day (QID) | RESPIRATORY_TRACT | 4 refills | Status: DC | PRN

## 2022-03-06 LAB — MISC LABCORP TEST (SEND OUT)
LabCorp test name: 88013
Labcorp test code: 88013

## 2022-03-06 LAB — MIN INHIBITORY CONC (2 DRUGS)

## 2022-03-07 DIAGNOSIS — Z992 Dependence on renal dialysis: Secondary | ICD-10-CM | POA: Diagnosis not present

## 2022-03-07 DIAGNOSIS — N2581 Secondary hyperparathyroidism of renal origin: Secondary | ICD-10-CM | POA: Diagnosis not present

## 2022-03-07 DIAGNOSIS — N186 End stage renal disease: Secondary | ICD-10-CM | POA: Diagnosis not present

## 2022-03-09 ENCOUNTER — Inpatient Hospital Stay
Admission: EM | Admit: 2022-03-09 | Discharge: 2022-03-13 | DRG: 280 | Disposition: A | Discharge status: Home Health | Payer: Medicare HMO | Attending: Internal Medicine | Admitting: Internal Medicine

## 2022-03-09 ENCOUNTER — Encounter: Payer: Self-pay | Admitting: Surgery

## 2022-03-09 ENCOUNTER — Ambulatory Visit
Admission: RE | Admit: 2022-03-09 | Discharge: 2022-03-09 | Disposition: A | Discharge status: Home / Self Care | Payer: Medicare HMO | Source: Ambulatory Visit | Attending: Internal Medicine | Admitting: Internal Medicine

## 2022-03-09 ENCOUNTER — Other Ambulatory Visit: Payer: Self-pay

## 2022-03-09 ENCOUNTER — Ambulatory Visit (INDEPENDENT_AMBULATORY_CARE_PROVIDER_SITE_OTHER): Payer: Medicare HMO | Admitting: Surgery

## 2022-03-09 ENCOUNTER — Encounter: Payer: Self-pay | Admitting: Emergency Medicine

## 2022-03-09 ENCOUNTER — Other Ambulatory Visit: Payer: Self-pay | Admitting: Internal Medicine

## 2022-03-09 ENCOUNTER — Ambulatory Visit
Admission: RE | Admit: 2022-03-09 | Discharge: 2022-03-09 | Disposition: A | Discharge status: Home / Self Care | Payer: Medicare HMO | Source: Ambulatory Visit | Attending: Nephrology | Admitting: Nephrology

## 2022-03-09 ENCOUNTER — Emergency Department: Payer: Medicare HMO

## 2022-03-09 ENCOUNTER — Encounter: Payer: Self-pay | Admitting: Internal Medicine

## 2022-03-09 VITALS — BP 176/81 | HR 98 | Temp 97.9°F | Ht 64.0 in | Wt 150.0 lb

## 2022-03-09 DIAGNOSIS — I10 Essential (primary) hypertension: Secondary | ICD-10-CM | POA: Diagnosis not present

## 2022-03-09 DIAGNOSIS — J189 Pneumonia, unspecified organism: Secondary | ICD-10-CM | POA: Diagnosis not present

## 2022-03-09 DIAGNOSIS — D509 Iron deficiency anemia, unspecified: Secondary | ICD-10-CM | POA: Diagnosis present

## 2022-03-09 DIAGNOSIS — E1122 Type 2 diabetes mellitus with diabetic chronic kidney disease: Secondary | ICD-10-CM | POA: Diagnosis present

## 2022-03-09 DIAGNOSIS — N186 End stage renal disease: Secondary | ICD-10-CM | POA: Diagnosis not present

## 2022-03-09 DIAGNOSIS — I429 Cardiomyopathy, unspecified: Secondary | ICD-10-CM | POA: Diagnosis present

## 2022-03-09 DIAGNOSIS — Z79890 Hormone replacement therapy (postmenopausal): Secondary | ICD-10-CM

## 2022-03-09 DIAGNOSIS — E039 Hypothyroidism, unspecified: Secondary | ICD-10-CM | POA: Diagnosis present

## 2022-03-09 DIAGNOSIS — R0789 Other chest pain: Secondary | ICD-10-CM

## 2022-03-09 DIAGNOSIS — R0602 Shortness of breath: Secondary | ICD-10-CM | POA: Insufficient documentation

## 2022-03-09 DIAGNOSIS — Z8249 Family history of ischemic heart disease and other diseases of the circulatory system: Secondary | ICD-10-CM

## 2022-03-09 DIAGNOSIS — K810 Acute cholecystitis: Secondary | ICD-10-CM | POA: Diagnosis not present

## 2022-03-09 DIAGNOSIS — Z992 Dependence on renal dialysis: Secondary | ICD-10-CM

## 2022-03-09 DIAGNOSIS — N2581 Secondary hyperparathyroidism of renal origin: Secondary | ICD-10-CM | POA: Diagnosis present

## 2022-03-09 DIAGNOSIS — Z8673 Personal history of transient ischemic attack (TIA), and cerebral infarction without residual deficits: Secondary | ICD-10-CM

## 2022-03-09 DIAGNOSIS — E119 Type 2 diabetes mellitus without complications: Secondary | ICD-10-CM | POA: Diagnosis not present

## 2022-03-09 DIAGNOSIS — N189 Chronic kidney disease, unspecified: Secondary | ICD-10-CM | POA: Diagnosis present

## 2022-03-09 DIAGNOSIS — E782 Mixed hyperlipidemia: Secondary | ICD-10-CM | POA: Diagnosis present

## 2022-03-09 DIAGNOSIS — G301 Alzheimer's disease with late onset: Secondary | ICD-10-CM | POA: Diagnosis present

## 2022-03-09 DIAGNOSIS — R531 Weakness: Secondary | ICD-10-CM

## 2022-03-09 DIAGNOSIS — D631 Anemia in chronic kidney disease: Secondary | ICD-10-CM | POA: Diagnosis not present

## 2022-03-09 DIAGNOSIS — E1140 Type 2 diabetes mellitus with diabetic neuropathy, unspecified: Secondary | ICD-10-CM | POA: Diagnosis present

## 2022-03-09 DIAGNOSIS — I5020 Unspecified systolic (congestive) heart failure: Secondary | ICD-10-CM

## 2022-03-09 DIAGNOSIS — J9600 Acute respiratory failure, unspecified whether with hypoxia or hypercapnia: Secondary | ICD-10-CM

## 2022-03-09 DIAGNOSIS — R54 Age-related physical debility: Secondary | ICD-10-CM | POA: Diagnosis present

## 2022-03-09 DIAGNOSIS — H9190 Unspecified hearing loss, unspecified ear: Secondary | ICD-10-CM | POA: Diagnosis present

## 2022-03-09 DIAGNOSIS — I132 Hypertensive heart and chronic kidney disease with heart failure and with stage 5 chronic kidney disease, or end stage renal disease: Secondary | ICD-10-CM | POA: Diagnosis present

## 2022-03-09 DIAGNOSIS — R0689 Other abnormalities of breathing: Secondary | ICD-10-CM | POA: Diagnosis not present

## 2022-03-09 DIAGNOSIS — Z79899 Other long term (current) drug therapy: Secondary | ICD-10-CM | POA: Diagnosis not present

## 2022-03-09 DIAGNOSIS — R000 Tachycardia, unspecified: Secondary | ICD-10-CM | POA: Diagnosis not present

## 2022-03-09 DIAGNOSIS — I110 Hypertensive heart disease with heart failure: Secondary | ICD-10-CM | POA: Diagnosis not present

## 2022-03-09 DIAGNOSIS — R0902 Hypoxemia: Secondary | ICD-10-CM | POA: Diagnosis not present

## 2022-03-09 DIAGNOSIS — I509 Heart failure, unspecified: Secondary | ICD-10-CM | POA: Diagnosis not present

## 2022-03-09 DIAGNOSIS — R059 Cough, unspecified: Secondary | ICD-10-CM

## 2022-03-09 DIAGNOSIS — F0280 Dementia in other diseases classified elsewhere without behavioral disturbance: Secondary | ICD-10-CM | POA: Diagnosis present

## 2022-03-09 DIAGNOSIS — J9601 Acute respiratory failure with hypoxia: Secondary | ICD-10-CM | POA: Diagnosis not present

## 2022-03-09 DIAGNOSIS — E871 Hypo-osmolality and hyponatremia: Secondary | ICD-10-CM | POA: Diagnosis present

## 2022-03-09 DIAGNOSIS — F17220 Nicotine dependence, chewing tobacco, uncomplicated: Secondary | ICD-10-CM | POA: Diagnosis present

## 2022-03-09 DIAGNOSIS — E1165 Type 2 diabetes mellitus with hyperglycemia: Secondary | ICD-10-CM | POA: Diagnosis present

## 2022-03-09 DIAGNOSIS — I214 Non-ST elevation (NSTEMI) myocardial infarction: Principal | ICD-10-CM | POA: Diagnosis present

## 2022-03-09 DIAGNOSIS — Z794 Long term (current) use of insulin: Secondary | ICD-10-CM | POA: Diagnosis not present

## 2022-03-09 DIAGNOSIS — J80 Acute respiratory distress syndrome: Secondary | ICD-10-CM | POA: Diagnosis not present

## 2022-03-09 DIAGNOSIS — I5021 Acute systolic (congestive) heart failure: Secondary | ICD-10-CM | POA: Diagnosis not present

## 2022-03-09 DIAGNOSIS — Z833 Family history of diabetes mellitus: Secondary | ICD-10-CM

## 2022-03-09 DIAGNOSIS — J811 Chronic pulmonary edema: Secondary | ICD-10-CM | POA: Diagnosis not present

## 2022-03-09 DIAGNOSIS — Z83438 Family history of other disorder of lipoprotein metabolism and other lipidemia: Secondary | ICD-10-CM

## 2022-03-09 DIAGNOSIS — Z7984 Long term (current) use of oral hypoglycemic drugs: Secondary | ICD-10-CM

## 2022-03-09 DIAGNOSIS — E8770 Fluid overload, unspecified: Secondary | ICD-10-CM | POA: Diagnosis present

## 2022-03-09 DIAGNOSIS — K219 Gastro-esophageal reflux disease without esophagitis: Secondary | ICD-10-CM | POA: Diagnosis present

## 2022-03-09 DIAGNOSIS — Z7982 Long term (current) use of aspirin: Secondary | ICD-10-CM

## 2022-03-09 DIAGNOSIS — R29818 Other symptoms and signs involving the nervous system: Secondary | ICD-10-CM | POA: Diagnosis present

## 2022-03-09 DIAGNOSIS — R4189 Other symptoms and signs involving cognitive functions and awareness: Secondary | ICD-10-CM | POA: Diagnosis present

## 2022-03-09 DIAGNOSIS — J9690 Respiratory failure, unspecified, unspecified whether with hypoxia or hypercapnia: Secondary | ICD-10-CM | POA: Diagnosis not present

## 2022-03-09 DIAGNOSIS — J90 Pleural effusion, not elsewhere classified: Secondary | ICD-10-CM | POA: Diagnosis not present

## 2022-03-09 LAB — COMPREHENSIVE METABOLIC PANEL
ALT: 18 U/L (ref 0–44)
AST: 36 U/L (ref 15–41)
Albumin: 3.1 g/dL — ABNORMAL LOW (ref 3.5–5.0)
Alkaline Phosphatase: 120 U/L (ref 38–126)
Anion gap: 16 — ABNORMAL HIGH (ref 5–15)
BUN: 21 mg/dL (ref 8–23)
CO2: 22 mmol/L (ref 22–32)
Calcium: 8.8 mg/dL — ABNORMAL LOW (ref 8.9–10.3)
Chloride: 95 mmol/L — ABNORMAL LOW (ref 98–111)
Creatinine, Ser: 7.28 mg/dL — ABNORMAL HIGH (ref 0.61–1.24)
GFR, Estimated: 7 mL/min — ABNORMAL LOW (ref >60)
Glucose, Bld: 186 mg/dL — ABNORMAL HIGH (ref 70–99)
Potassium: 4.9 mmol/L (ref 3.5–5.1)
Sodium: 133 mmol/L — ABNORMAL LOW (ref 135–145)
Total Bilirubin: 1.3 mg/dL — ABNORMAL HIGH (ref 0.3–1.2)
Total Protein: 7.5 g/dL (ref 6.5–8.1)

## 2022-03-09 LAB — BRAIN NATRIURETIC PEPTIDE: B Natriuretic Peptide: 2687.5 pg/mL — ABNORMAL HIGH (ref 0.0–100.0)

## 2022-03-09 LAB — CBC WITH DIFFERENTIAL/PLATELET
Abs Immature Granulocytes: 0.13 10*3/uL — ABNORMAL HIGH (ref 0.00–0.07)
Basophils Absolute: 0.1 10*3/uL (ref 0.0–0.1)
Basophils Relative: 0 %
Eosinophils Absolute: 0.1 10*3/uL (ref 0.0–0.5)
Eosinophils Relative: 0 %
HCT: 28.2 % — ABNORMAL LOW (ref 39.0–52.0)
Hemoglobin: 8.8 g/dL — ABNORMAL LOW (ref 13.0–17.0)
Immature Granulocytes: 1 %
Lymphocytes Relative: 2 %
Lymphs Abs: 0.5 10*3/uL — ABNORMAL LOW (ref 0.7–4.0)
MCH: 27.1 pg (ref 26.0–34.0)
MCHC: 31.2 g/dL (ref 30.0–36.0)
MCV: 86.8 fL (ref 80.0–100.0)
Monocytes Absolute: 0.9 10*3/uL (ref 0.1–1.0)
Monocytes Relative: 4 %
Neutro Abs: 24.1 10*3/uL — ABNORMAL HIGH (ref 1.7–7.7)
Neutrophils Relative %: 93 %
Platelets: 311 10*3/uL (ref 150–400)
RBC: 3.25 MIL/uL — ABNORMAL LOW (ref 4.22–5.81)
RDW: 18.6 % — ABNORMAL HIGH (ref 11.5–15.5)
Smear Review: NORMAL
WBC: 25.7 10*3/uL — ABNORMAL HIGH (ref 4.0–10.5)
nRBC: 0 % (ref 0.0–0.2)

## 2022-03-09 LAB — PROCALCITONIN: Procalcitonin: 0.67 ng/mL

## 2022-03-09 LAB — PROTIME-INR
INR: 1.1 (ref 0.8–1.2)
Prothrombin Time: 13.6 seconds (ref 11.4–15.2)

## 2022-03-09 LAB — CBG MONITORING, ED: Glucose-Capillary: 284 mg/dL — ABNORMAL HIGH (ref 70–99)

## 2022-03-09 LAB — APTT: aPTT: 31 seconds (ref 24–36)

## 2022-03-09 LAB — TROPONIN I (HIGH SENSITIVITY)
Troponin I (High Sensitivity): 982 ng/L (ref <18)
Troponin I (High Sensitivity): 1240 ng/L (ref <18)

## 2022-03-09 LAB — LACTIC ACID, PLASMA
Lactic Acid, Venous: 1.8 mmol/L (ref 0.5–1.9)
Lactic Acid, Venous: 1.1 mmol/L (ref 0.5–1.9)

## 2022-03-09 MED ORDER — SENNOSIDES-DOCUSATE SODIUM 8.6-50 MG PO TABS: 1.0000 | ORAL_TABLET | Freq: Every evening | ORAL | Status: DC | PRN

## 2022-03-09 MED ORDER — NITROGLYCERIN 2 % TD OINT: 0.5000 [in_us] | TOPICAL_OINTMENT | Freq: Four times a day (QID) | TRANSDERMAL | Status: DC | PRN

## 2022-03-09 MED ORDER — HEPARIN (PORCINE) 25000 UT/250ML-% IV SOLN
1200.0000 [IU]/h | INTRAVENOUS | Status: AC
  Administered 2022-03-09: 800 [IU]/h via INTRAVENOUS
  Administered 2022-03-10 – 2022-03-11 (×2): 1200 [IU]/h via INTRAVENOUS
  Filled 2022-03-09 (×3): qty 250

## 2022-03-09 MED ORDER — CHLORHEXIDINE GLUCONATE CLOTH 2 % EX PADS
6.0000 | MEDICATED_PAD | Freq: Every day | CUTANEOUS | Status: DC
  Administered 2022-03-12 – 2022-03-13 (×2): 6 via TOPICAL

## 2022-03-09 MED ORDER — NITROGLYCERIN 2 % TD OINT
0.5000 [in_us] | TOPICAL_OINTMENT | Freq: Once | TRANSDERMAL | Status: AC
  Administered 2022-03-09: 0.5 [in_us] via TOPICAL
  Filled 2022-03-09: qty 1

## 2022-03-09 MED ORDER — ACETAMINOPHEN 650 MG RE SUPP: 650.0000 mg | Freq: Four times a day (QID) | RECTAL | Status: DC | PRN

## 2022-03-09 MED ORDER — HEPARIN SODIUM (PORCINE) 5000 UNIT/ML IJ SOLN
4000.0000 [IU] | Freq: Once | INTRAMUSCULAR | Status: AC
  Administered 2022-03-09: 4000 [IU] via INTRAVENOUS
  Filled 2022-03-09: qty 1

## 2022-03-09 MED ORDER — ONDANSETRON HCL 4 MG/2ML IJ SOLN
4.0000 mg | Freq: Four times a day (QID) | INTRAMUSCULAR | Status: DC | PRN
  Administered 2022-03-12: 4 mg via INTRAVENOUS
  Filled 2022-03-09: qty 2

## 2022-03-09 MED ORDER — CALCIUM ACETATE (PHOS BINDER) 667 MG PO CAPS
1334.0000 mg | ORAL_CAPSULE | Freq: Three times a day (TID) | ORAL | Status: DC
  Administered 2022-03-10 – 2022-03-13 (×10): 1334 mg via ORAL
  Filled 2022-03-09 (×11): qty 2

## 2022-03-09 MED ORDER — HEPARIN (PORCINE) 25000 UT/250ML-% IV SOLN: 10.0000 [IU]/kg/h | INTRAVENOUS | Status: DC

## 2022-03-09 MED ORDER — ALBUTEROL SULFATE (2.5 MG/3ML) 0.083% IN NEBU
2.5000 mg | INHALATION_SOLUTION | Freq: Four times a day (QID) | RESPIRATORY_TRACT | Status: DC | PRN
  Administered 2022-03-11: 2.5 mg via RESPIRATORY_TRACT
  Filled 2022-03-09: qty 3

## 2022-03-09 MED ORDER — INSULIN DETEMIR 100 UNIT/ML ~~LOC~~ SOLN
15.0000 [IU] | Freq: Every day | SUBCUTANEOUS | Status: DC
  Administered 2022-03-09: 15 [IU] via SUBCUTANEOUS
  Filled 2022-03-09 (×2): qty 0.15

## 2022-03-09 MED ORDER — RENA-VITE PO TABS
1.0000 | ORAL_TABLET | Freq: Every day | ORAL | Status: DC
  Administered 2022-03-10 – 2022-03-13 (×4): 1 via ORAL
  Filled 2022-03-09 (×4): qty 1

## 2022-03-09 MED ORDER — SODIUM CHLORIDE 0.9 % IV SOLN
500.0000 mg | INTRAVENOUS | Status: DC
  Administered 2022-03-10 – 2022-03-12 (×4): 500 mg via INTRAVENOUS
  Filled 2022-03-09: qty 500
  Filled 2022-03-09 (×3): qty 5

## 2022-03-09 MED ORDER — ASPIRIN 81 MG PO CHEW
324.0000 mg | CHEWABLE_TABLET | Freq: Once | ORAL | Status: AC
  Administered 2022-03-09: 324 mg via ORAL
  Filled 2022-03-09: qty 4

## 2022-03-09 MED ORDER — INSULIN ASPART 100 UNIT/ML IJ SOLN: 0.0000 [IU] | Freq: Three times a day (TID) | INTRAMUSCULAR | Status: DC

## 2022-03-09 MED ORDER — ASPIRIN 81 MG PO TBEC
81.0000 mg | DELAYED_RELEASE_TABLET | Freq: Every day | ORAL | Status: DC
  Administered 2022-03-10 – 2022-03-13 (×4): 81 mg via ORAL
  Filled 2022-03-09 (×4): qty 1

## 2022-03-09 MED ORDER — INSULIN ASPART 100 UNIT/ML IJ SOLN
0.0000 [IU] | Freq: Every day | INTRAMUSCULAR | Status: DC
  Administered 2022-03-09: 3 [IU] via SUBCUTANEOUS
  Filled 2022-03-09: qty 1

## 2022-03-09 MED ORDER — FUROSEMIDE 10 MG/ML IJ SOLN
40.0000 mg | Freq: Once | INTRAMUSCULAR | Status: AC
  Administered 2022-03-09: 40 mg via INTRAVENOUS
  Filled 2022-03-09: qty 4

## 2022-03-09 MED ORDER — ACETAMINOPHEN 325 MG PO TABS: 650.0000 mg | ORAL_TABLET | Freq: Four times a day (QID) | ORAL | Status: DC | PRN

## 2022-03-09 MED ORDER — PANTOPRAZOLE SODIUM 40 MG PO TBEC
40.0000 mg | DELAYED_RELEASE_TABLET | Freq: Every day | ORAL | Status: DC
  Administered 2022-03-10 – 2022-03-13 (×4): 40 mg via ORAL
  Filled 2022-03-09 (×4): qty 1

## 2022-03-09 MED ORDER — ONDANSETRON HCL 4 MG PO TABS: 4.0000 mg | ORAL_TABLET | Freq: Four times a day (QID) | ORAL | Status: DC | PRN

## 2022-03-09 MED ORDER — SODIUM CHLORIDE 0.9 % IV SOLN
2.0000 g | INTRAVENOUS | Status: DC
  Administered 2022-03-10 – 2022-03-12 (×4): 2 g via INTRAVENOUS
  Filled 2022-03-09 (×4): qty 20

## 2022-03-09 MED ORDER — LEVOTHYROXINE SODIUM 137 MCG PO TABS
137.0000 ug | ORAL_TABLET | Freq: Every day | ORAL | Status: DC
  Administered 2022-03-10 – 2022-03-13 (×4): 137 ug via ORAL
  Filled 2022-03-09 (×4): qty 1

## 2022-03-09 MED ORDER — SIMVASTATIN 10 MG PO TABS
20.0000 mg | ORAL_TABLET | Freq: Every day | ORAL | Status: DC
  Administered 2022-03-10: 20 mg via ORAL
  Filled 2022-03-09: qty 2

## 2022-03-09 NOTE — H&P (Addendum)
History and Physical   Adedamola Seto ESP:[id number] DOB: 1946-04-27 DOA: 03/09/2022  PCP: [name], MD  Outpatient Specialists: Dr. Manuella Ghazi, neurology Patient coming from: Home via EMS  I have personally briefly reviewed patient's old medical records in [place].  Chief Concern: Shortness of breath, diaphoresis  HPI: [name] is a 76 year old male with history of end-stage renal disease on hemodialysis Tuesday Thursday Saturday, hypertension, insulin-dependent diabetes mellitus, neuropathy, GERD, hypothyroid, hard of hearing, who presents to the emergency department for chief concerns of dyspnea, and diaphoresis.  Initial vitals in the emergency department showed temperature of 98.1, respiration rate of 23, heart rate of 113, blood pressure 161/106, SPO2 of 82% on room air and patient was placed on BiPAP with improvement to 99% on room air.  Serum sodium is 133, potassium 4.9, chloride 95, bicarb 22, BUN of 21, serum creatinine of 7.28, nonfasting blood glucose 182, EGFR is 7, WBC 13.7, hemoglobin 9.7, platelets of 242.  Lactic acid is 1.8.  High sensitive troponin was elevated at 982.  BNP elevated 2687.5.  ED treatment: Heparin GGT, furosemide 40 mg IV one-time dose, nitroglycerin 0.5 inch.  EDP consulted nephrology and cardiology ----------  At bedside he is able to tell me his name, his age, current calendar year and he knows he is in the hospital.  He was able to identify his spouse at bedside.  He reports that he became diaphoretic and shortness of breath while watching TV. He he reports he is never felt this way before.  He denies chest pain.  No increased swelling of his lower extremities.  He denies trauma to his person.  EMS was called however his diaphoresis and shortness of breath improved therefore EMS left.  Once EMS left, patient then developed diaphoresis and shortness of breath again.  EMS was then called and placed patient on CPAP and patient was taken to  the emergency department for further evaluation.  Social history: He lives with his wife.  He denies tobacco, EtOH, recreational drug use.  ROS: Constitutional: no weight change, no fever ENT/Mouth: no sore throat, no rhinorrhea Eyes: no eye pain, no vision changes Cardiovascular: no chest pain, + dyspnea, + diaphoresis, no edema, no palpitations Respiratory: no cough, no sputum, no wheezing Gastrointestinal: no nausea, no vomiting, no diarrhea, no constipation Genitourinary: no urinary incontinence, no dysuria, no hematuria Musculoskeletal: no arthralgias, no myalgias Skin: no skin lesions, no pruritus, Neuro: + weakness, no loss of consciousness, no syncope Psych: no anxiety, no depression, + decrease appetite Heme/Lymph: no bruising, no bleeding  ED Course: Discussed with emergency medicine provider, patient requiring hospitalization for chief concerns of NSTEMI.  Assessment/Plan  Principal Problem:   NSTEMI (non-ST elevated myocardial infarction) ([place]) Active Problems:   Volume overload   End stage renal disease (HCC)   Generalized weakness   Essential hypertension   Anemia in chronic kidney disease   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[place])   Insulin dependent type 2 diabetes mellitus ([place])   Hypothyroidism   Mixed hyperlipidemia   Type 2 diabetes mellitus with diabetic neuropathy, unspecified (HCC)   Neurocognitive deficits   Iron deficiency anemia, unspecified   Secondary hyperparathyroidism of renal origin ([place])   History of CVA (cerebrovascular accident)   Acute clinical systolic heart failure ([place])   CAP (community acquired pneumonia)   Assessment and Plan:  * NSTEMI (non-ST elevated myocardial infarction) ([place]) - Continue heparin GGT - Admit to inpatient, stepdown - Nitroglycerin-half an inch every 6 hours as  needed for chest pain - Cardiology, Dr. Nehemiah Massed has been consulted and recommends treatment with heart failure exacerbation first and they  will evaluate patient in the a.m.  Volume overload - Strict I's and O's - Continue BiPAP  End stage renal disease ([place]) - On dialysis TU TH SA - Nephrology has been consulted and recommends stabilization from a cardiac standpoint before dialysis can be attempted - Strict I's and O's  Insulin dependent type 2 diabetes mellitus (HCC) - Insulin SSI with at bedtime coverage ordered  Hypothyroidism - Patient takes levothyroxine 137 mcg daily  Mixed hyperlipidemia - Resume simvastatin 20 mg daily  CAP (community acquired pneumonia) - Possible right lower lobe with elevated procalcitonin - Azithromycin, ceftriaxone - Check procalcitonin in the AM  Acute clinical systolic heart failure (HCC) - Strict I's and O's - Complete echo ordered  History of CVA (cerebrovascular accident) - Resume aspirin 81 mg daily  Chart reviewed.   DVT prophylaxis: Heparin GTT Code Status: Full code Diet: N.p.o. except for sips with meds Family Communication: Discussed with spouse at bedside with patient's permission Disposition Plan: Pending clinical course Consults called: Nephrology, cardiology Admission status: Inpatient, step down  Past Medical History:  Diagnosis Date   Clotting disorder ([place])    per daughter, "when patient travels, his feet swell"   Diabetes mellitus without complication ([place])    type II   Diabetic eye exam ([place]) 12/2019   ESRD (end stage renal disease) ([place])    MWF- [facility]   GERD (gastroesophageal reflux disease)    [name] (hard of hearing)    Hyperlipidemia    Hypertension    Hypothyroidism    Thyroid disease    Past Surgical History:  Procedure Laterality Date   AV FISTULA PLACEMENT Right 10/22/2017   Procedure: ARTERIOVENOUS (AV) BRACHIOCEPHALIC FISTULA CREATION RIGHT UPPER ARM;  Surgeon: Conrad [place], MD;  Location: [facility];  Service: Vascular;  Laterality: Right;   AV FISTULA PLACEMENT Left 02/03/2019   Procedure: INSERTION OF ARTERIOVENOUS (AV) GORE-TEX  GRAFT ARM (BRACHIAL AXILLARY );  Surgeon: [name], MD;  Location: [facility];  Service: Vascular;  Laterality: Left;   AV FISTULA PLACEMENT Left 12/10/2020   Procedure: LEFT ARM BRACHIOCEPHALIC ARTERIOVENOUS (AV) FISTULA CREATION;  Surgeon: [name], MD;  Location: [facility];  Service: Vascular;  Laterality: Left;   IR PERC CHOLECYSTOSTOMY  01/21/2022   UPPER EXTREMITY ANGIOGRAPHY Left 09/19/2019   Procedure: UPPER EXTREMITY ANGIOGRAPHY;  Surgeon: [name], MD;  Location: [facility];  Service: Cardiovascular;  Laterality: Left;   Social History:  reports that he has never smoked. His smokeless tobacco use includes snuff. He reports that he does not drink alcohol and does not use drugs.  No Known Allergies Family History  Problem Relation Age of Onset   Diabetes Mother    Hyperlipidemia Mother    Hypertension Father    Family history: Family history reviewed and not pertinent  Prior to Admission medications   Medication Sig Start Date End Date Taking? Authorizing Provider  albuterol (PROVENTIL) (2.5 MG/3ML) 0.083% nebulizer solution Take 3 mLs (2.5 mg total) by nebulization every 6 (six) hours as needed for wheezing or shortness of breath. 10/28/20   [name], MD  amLODipine (NORVASC) 10 MG tablet Take 1 tablet (10 mg total) by mouth daily. 02/02/22   Fritzi Mandes, MD  aspirin EC 81 MG tablet Take 81 mg by mouth daily. Swallow whole.    [provider]  B Complex-C-Folic Acid (  RENA-VITE RX) 1 MG TABS Take 1 tablet by mouth daily.  12/07/18   [provider]  BD PEN NEEDLE NANO 2ND GEN 32G X 4 MM MISC USE AS DIRECTED 02/26/22   [name], MD  calcium acetate (PHOSLO) 667 MG capsule TAKE 2 CAPSULES BY MOUTH 3 TIMES A DAY WITH MEAL 02/16/22   [name], MD  chlorproMAZINE (THORAZINE) 25 MG tablet TAKE 1 TABLET BY MOUTH THREE TIMES A DAY 02/27/22   [name], MD  Cholecalciferol (VITAMIN D3) 5000 units TABS Take 5,000 Units by mouth  daily.     [provider]  Continuous Blood Gluc Sensor (FREESTYLE LIBRE 14 DAY SENSOR) MISC APPLY EVERY 14 (FOURTEEN) DAYS. 02/02/22   [name], MD  docusate sodium (COLACE) 100 MG capsule TAKE 1 CAPSULE BY MOUTH TWICE A DAY 10/06/21   [name], MD  donepezil (ARICEPT) 10 MG tablet TAKE 1 TABLET BY MOUTH EVERY DAY 04/22/21   [name], MD  gabapentin (NEURONTIN) 300 MG capsule TAKE 1 CAPSULE BY MOUTH EVERY DAY AT BEDTIME AS DIRECTED 11/05/21   [name], MD  hydrALAZINE (APRESOLINE) 50 MG tablet Take 1 tablet (50 mg total) by mouth 3 (three) times daily. 02/02/22   Fritzi Mandes, MD  insulin detemir (LEVEMIR) 100 UNIT/ML injection Inject 0.15 mLs (15 Units total) into the skin at bedtime. 02/02/22   Fritzi Mandes, MD  ipratropium-albuterol (DUONEB) 0.5-2.5 (3) MG/3ML SOLN Take 3 mLs by nebulization every 6 (six) hours as needed. 03/05/22   [name], MD  levocetirizine (XYZAL) 5 MG tablet TAKE 1 TABLET BY MOUTH EVERY DAY IN THE EVENING 01/07/21   [name], MD  levothyroxine (SYNTHROID) 137 MCG tablet TAKE 1 TABLET BY MOUTH EVERY DAY 03/24/21   [name], MD  losartan (COZAAR) 100 MG tablet Take 1 tablet (100 mg total) by mouth every evening. 02/02/22   Fritzi Mandes, MD  metoprolol succinate (TOPROL-XL) 50 MG 24 hr tablet TAKE 1 TABLET BY MOUTH EVERY DAY 01/23/22   [name], MD  Multiple Vitamins-Minerals (MULTIVITAMIN WITH MINERALS) tablet Take 1 tablet by mouth daily.    [provider]  pantoprazole (PROTONIX) 40 MG tablet TAKE 1 TABLET BY MOUTH EVERY DAY 07/09/21   [name], MD  sevelamer carbonate (RENVELA) 2.4 g PACK Take 2.4 g by mouth daily. 02/25/22   [name], MD  simvastatin (ZOCOR) 20 MG tablet TAKE 1 TABLET BY MOUTH EVERY DAY IN THE EVENING 03/03/21   [name], MD  sitaGLIPtin (JANUVIA) 50 MG tablet Take 50 mg by mouth every evening.     [provider]  sodium chloride flush (NS) 0.9 % SOLN Inject 5 mLs into the vein daily.  Flush cholecystostomy tube daily with 5 ccs of NS 01/22/22 03/23/22  [name], PA-C   Physical Exam: Vitals:   03/09/22 1935 03/09/22 2000 03/09/22 2230 03/09/22 2300  BP: (!) 133/59 (!) 137/105 129/61   Pulse: 97 88 69   Resp: (!) 21 19 15    Temp:    98.4 F (36.9 C)  TempSrc:    Axillary  SpO2: 99% 100% 100%   Weight:       Constitutional: appears frail, NAD, calm, comfortable Eyes: PERRL, lids and conjunctivae normal ENMT: Mucous membranes are moist. Posterior pharynx clear of any exudate or lesions. Age-appropriate dentition. Hearing appropriate Neck: normal, supple, no masses, no thyromegaly Respiratory: clear to auscultation bilaterally, no wheezing, no crackles. Normal respiratory effort. No accessory muscle use.  Cardiovascular: Regular rate and  rhythm, no murmurs / rubs / gallops. No extremity edema. 2+ pedal pulses. No carotid bruits.  Abdomen: no tenderness, no masses palpated, no hepatosplenomegaly. Bowel sounds positive.  Musculoskeletal: no clubbing / cyanosis. No joint deformity upper and lower extremities. Good ROM, no contractures, no atrophy. Normal muscle tone.  Skin: no rashes, lesions, ulcers. No induration Neurologic: Sensation intact. Strength 5/5 in all 4.  Psychiatric: Normal judgment and insight. Alert and oriented x 3.  Depressed mood.   EKG: independently reviewed, showing sinus tachycardia with rate of 114, QTc 467  Chest x-ray on Admission: I personally reviewed and I agree with radiologist reading as below.  DG Chest Portable 1 View  Result Date: 03/09/2022 CLINICAL DATA:  Shortness of breath EXAM: PORTABLE CHEST 1 VIEW COMPARISON:  03/09/2022, CT 01/20/2022, radiograph 01/18/2022 FINDINGS: Right-sided central venous catheter tips over the right atrium. Left upper extremity vascular stent. Worsening airspace disease at the right greater than left lung base. Cardiomegaly with vascular congestion. No pneumothorax. IMPRESSION: 1. Cardiomegaly with  vascular congestion and pulmonary edema 2. Worsening airspace disease at the right greater than left lung base, atelectasis versus pneumonia Electronically Signed   By: Donavan Foil M.D.   On: 03/09/2022 19:24   DG Chest 2 View  Result Date: 03/09/2022 CLINICAL DATA:  Cough EXAM: CHEST - 2 VIEW COMPARISON:  Chest x-ray dated September 18, 2021 FINDINGS: Unchanged position of right-sided central venous line. Cardiac and mediastinal contours are within normal limits. Mild bilateral interstitial opacities. Small bilateral pleural effusions. No evidence of pneumothorax. Partially visualized percutaneous cholecystostomy tube. IMPRESSION: 1. Mild pulmonary edema. 2. Small bilateral pleural effusions. Electronically Signed   By: Yetta Glassman M.D.   On: 03/09/2022 15:49    Labs on Admission: I have personally reviewed following labs  CBC: Recent Labs  Lab 03/09/22 2044  WBC 25.7*  NEUTROABS 24.1*  HGB 8.8*  HCT 28.2*  MCV 86.8  PLT 193    Basic Metabolic Panel: Recent Labs  Lab 03/09/22 1901  NA 133*  K 4.9  CL 95*  CO2 22  GLUCOSE 186*  BUN 21  CREATININE 7.28*  CALCIUM 8.8*   GFR: Estimated Creatinine Clearance: 7.2 mL/min (A) (by C-G formula based on SCr of 7.28 mg/dL (H)).  Liver Function Tests: Recent Labs  Lab 03/09/22 1901  AST 36  ALT 18  ALKPHOS 120  BILITOT 1.3*  PROT 7.5  ALBUMIN 3.1*   Urine analysis:    Component Value Date/Time   COLORURINE Straw 09/11/2013 1933   APPEARANCEUR Clear 09/11/2013 1933   LABSPEC 1.016 09/11/2013 1933   PHURINE 7.0 09/11/2013 1933   GLUCOSEU >=500 09/11/2013 1933   HGBUR Negative 09/11/2013 1933   BILIRUBINUR Negative 09/11/2013 1933   KETONESUR Negative 09/11/2013 1933   PROTEINUR 100 mg/dL 09/11/2013 1933   NITRITE Negative 09/11/2013 1933   LEUKOCYTESUR Negative 09/11/2013 1933   CRITICAL CARE Performed by: Briant Cedar Niyah Mamaril  Total critical care time: 35 minutes  Critical care time was exclusive of separately billable  procedures and treating other patients.  Critical care was necessary to treat or prevent imminent or life-threatening deterioration.   Critical care was time spent personally by me on the following activities: development of treatment plan with patient and/or surrogate as well as nursing, discussions with consultants, evaluation of patient's response to treatment, examination of patient, obtaining history from patient or surrogate, ordering and performing treatments and interventions, ordering and review of laboratory studies, ordering and review of radiographic studies, pulse oximetry and  re-evaluation of patient's condition.  Dr. Tobie Poet [facility]  If 7PM-7AM, please contact overnight-coverage provider If 7AM-7PM, please contact day coverage provider [email or web address]  03/09/2022, 11:42 PM

## 2022-03-09 NOTE — ED Provider Notes (Signed)
[facility] Provider Note    Event Date/Time   First MD Initiated Contact with Patient 03/09/22 1900     (approximate)   History   Respiratory Distress   HPI  Joseph Mcpherson is a 76 y.o. male who had an episode of shortness of breath with sweating earlier today he called EMS and everything got better EMS left then this evening he had another episode of sweating and shortness of breath EMS arrived found him in respiratory distress with a lot of crackles in his lungs.  It seemed to get worse while they were there they brought him here.  He is on BiPAP now EMS brought him in on CPAP.  O2 sats on CPAP were in the 90s.  They were in the low 80s without that.  Patient is not currently sweating. Patient gets dialysis Tuesdays Thursdays and on the weekend.  His next dialysis is tomorrow.     Physical Exam   Triage Vital Signs: ED Triage Vitals  Enc Vitals Group     BP --      Pulse --      Resp --      Temp --      Temp src --      SpO2 03/09/22 1856 (!) 82 %     Weight 03/09/22 1858 153 lb (69.4 kg)     Height --      Head Circumference --      Peak Flow --      Pain Score --      Pain Loc --      Pain Edu? --      Excl. in [place]? --     Most recent vital signs: Vitals:   03/09/22 1901 03/09/22 1935  BP: (!) 161/107 (!) 133/59  Pulse: (!) 112 97  Resp: (!) 23 (!) 21  Temp: 98.1 F (36.7 C)   SpO2: 100% 99%     General: Awake, alert, seems stil short of breath CV:  Good peripheral perfusion. tachy Resp:   increased effort crackles in bases Abd:  No distention.  Soft nontender patient has a drain in his gallbladder Extremities no edema   ED Results / Procedures / Treatments   Labs (all labs ordered are listed, but only abnormal results are displayed) Labs Reviewed  COMPREHENSIVE METABOLIC PANEL - Abnormal; Notable for the following components:      Result Value   Sodium 133 (*)    Chloride 95 (*)    Glucose, Bld 186 (*)    Creatinine,  Ser 7.28 (*)    Calcium 8.8 (*)    Albumin 3.1 (*)    Total Bilirubin 1.3 (*)    GFR, Estimated 7 (*)    Anion gap 16 (*)    All other components within normal limits  LACTIC ACID, PLASMA  BRAIN NATRIURETIC PEPTIDE  LACTIC ACID, PLASMA  CBC WITH DIFFERENTIAL/PLATELET  CBC WITH DIFFERENTIAL/PLATELET  PROTIME-INR  APTT  PROCALCITONIN  TROPONIN I (HIGH SENSITIVITY)     EKG  EKG read and interpreted by me shows sinus tachycardia at 114 normal axis some ST segment depression inferiorly and on the limb leads and laterally in the chest leads   RADIOLOGY    PROCEDURES:  Critical Care performed: Critical care time 45 minutes.  This includes evaluating the patient reviewing some of his lab work and studies discussing his case with cardiology and renal once the troponin was reported by lab to be over 900.  I am also talking to the hospital doc.  Procedures   MEDICATIONS ORDERED IN ED: Medications  aspirin chewable tablet 324 mg (has no administration in time range)  heparin injection 4,000 Units (has no administration in time range)  heparin ADULT infusion 100 units/mL (25000 units/242m) (has no administration in time range)  furosemide (LASIX) injection 40 mg (40 mg Intravenous Given 03/09/22 1918)  nitroGLYCERIN (NITROGLYN) 2 % ointment 0.5 inch (0.5 inches Topical Given 03/09/22 1918)     IMPRESSION / MDM / ASSESSMENT AND PLAN / ED COURSE  I reviewed the triage vital signs and the nursing notes. ----------------------------------------- 8:13 PM on 03/09/2022 ----------------------------------------- Patient's labs are not back at this point.  Patient says he is feeling better he looks more comfortable.  Sats are doing well vital signs are stable  Differential diagnosis includes, but is not limited to, NSTEMI, pneumonia, CHF/pulmonary edema, needs dialysis  Patient's presentation is most consistent with acute presentation with potential threat to life or bodily  function.  The patient is on the cardiac monitor to evaluate for evidence of arrhythmia and/or significant heart rate changes.  None have been seen so far      FINAL CLINICAL IMPRESSION(S) / ED DIAGNOSES   Final diagnoses:  Hypoxia  Acute respiratory failure, unspecified whether with hypoxia or hypercapnia (HCC)  Systolic congestive heart failure, unspecified HF chronicity (HCC)  NSTEMI (non-ST elevated myocardial infarction) ([place]     Rx / DC Orders   ED Discharge Orders     None        Note:  This document was prepared using Dragon voice recognition software and may include unintentional dictation errors.   MNena Polio MD 03/09/22 2303

## 2022-03-09 NOTE — Assessment & Plan Note (Signed)
-   Resume aspirin 81 mg daily

## 2022-03-09 NOTE — Consult Note (Signed)
ANTICOAGULATION CONSULT NOTE - Initial Consult  Pharmacy Consult for heparin infusion Indication: chest pain/ACS  No Known Allergies  Patient Measurements: Weight: 69.4 kg (153 lb) Heparin Dosing Weight: 68 kg  Vital Signs: Temp: 98.1 F (36.7 C) (10/16 1901) Temp Source: Axillary (10/16 1901) BP: 133/59 (10/16 1935) Pulse Rate: 97 (10/16 1935)  Labs: No results for input(s): "HGB", "HCT", "PLT", "APTT", "LABPROT", "INR", "HEPARINUNFRC", "HEPRLOWMOCWT", "CREATININE", "CKTOTAL", "CKMB", "TROPONINIHS" in the last 72 hours.  Estimated Creatinine Clearance: 7.5 mL/min (A) (by C-G formula based on SCr of 7.01 mg/dL (H)).   Medical History: Past Medical History:  Diagnosis Date   Clotting disorder ([place])    per daughter, "when patient travels, his feet swell"   Diabetes mellitus without complication ([place])    type II   Diabetic eye exam ([place]) 12/2019   ESRD (end stage renal disease) ([place])    MWF- [place]   GERD (gastroesophageal reflux disease)    [name] (hard of hearing)    Hyperlipidemia    Hypertension    Hypothyroidism    Thyroid disease     Medications:  No prior anticoagulation noted   Assessment:  76 y.o. male with history of ESRD on HD who had an episode of shortness of breath with sweating. Found to have elevated troponin I of 982. Pharmacy has been consulted for initiation and management of heparin infusion  Goal of Therapy:  Heparin level 0.3-0.7 units/ml Monitor platelets by anticoagulation protocol: Yes   Plan:  Give 4000 units bolus x 1 Start heparin infusion at 800 units/hr Check anti-Xa level in 8 hours Continue to monitor H&H and platelets  Dorothe Pea, PharmD, BCPS Clinical Pharmacist   03/09/2022,8:05 PM

## 2022-03-09 NOTE — ED Notes (Signed)
Consent obtained for hemodialysis. 150 ml emptied from biliary drain.

## 2022-03-09 NOTE — ED Notes (Signed)
Phlebotomist at bedside redrawing labs. Pharmacy called regarding heparin gtt, state they are awaiting baseline CBC before entering heparin gtt order.

## 2022-03-09 NOTE — Assessment & Plan Note (Signed)
Volume is being managed with dialysis. Routine dialysis on TTS Nephrology is on board.

## 2022-03-09 NOTE — Patient Instructions (Signed)
  Cholangiogram 03/18/22 @ 9:45.

## 2022-03-09 NOTE — Assessment & Plan Note (Signed)
-   Patient takes levothyroxine 137 mcg daily

## 2022-03-09 NOTE — Assessment & Plan Note (Signed)
-   Insulin SSI with at bedtime coverage ordered °

## 2022-03-09 NOTE — Progress Notes (Signed)
03/09/2022  History of Present Illness: Joseph Mcpherson is a 76 y.o. male presenting for follow up of acute cholecystitis.  Patient was admitted on 01/18/22 with fever, malaise, and lethargy.  Workup with CT scan on 01/20/22 showed acute cholecystitis but given his initial presentation, he was managed with a percutaneous cholecystostomy drain, which was placed on 01/21/22.  He was eventually discharged on 02/02/22 after a prolonged course in the hospital.    Today, the patient reports that he's doing much better.  His mind is clearer and his energy has been improving.  He is ambulating, eating well, without any pain or nausea.  He and his wife flush the drain once daily and they report the bag is about 1/2 to 3/4 full each day.    He recently had an episode of bronchitis on 02/25/22 but he's breathing well and denies any coughing.  Past Medical History: Past Medical History:  Diagnosis Date   Clotting disorder ([name])    per daughter, "when patient travels, his feet swell"   Diabetes mellitus without complication ([name])    type II   Diabetic eye exam ([name]) 12/2019   ESRD (end stage renal disease) ([name])    MWF- [name]   GERD (gastroesophageal reflux disease)    [name] (hard of hearing)    Hyperlipidemia    Hypertension    Hypothyroidism    Thyroid disease      Past Surgical History: Past Surgical History:  Procedure Laterality Date   AV FISTULA PLACEMENT Right 10/22/2017   Procedure: ARTERIOVENOUS (AV) BRACHIOCEPHALIC FISTULA CREATION RIGHT UPPER ARM;  Surgeon: [name], MD;  Location: [facility];  Service: Vascular;  Laterality: Right;   AV FISTULA PLACEMENT Left 02/03/2019   Procedure: INSERTION OF ARTERIOVENOUS (AV) GORE-TEX GRAFT ARM (BRACHIAL AXILLARY );  Surgeon: [name], MD;  Location: [facility];  Service: Vascular;  Laterality: Left;   AV FISTULA PLACEMENT Left 12/10/2020   Procedure: LEFT ARM BRACHIOCEPHALIC ARTERIOVENOUS (AV) FISTULA CREATION;  Surgeon: [name],  MD;  Location: [name];  Service: Vascular;  Laterality: Left;   IR PERC CHOLECYSTOSTOMY  01/21/2022   UPPER EXTREMITY ANGIOGRAPHY Left 09/19/2019   Procedure: UPPER EXTREMITY ANGIOGRAPHY;  Surgeon: [name], MD;  Location: [name];  Service: Cardiovascular;  Laterality: Left;    Home Medications: Prior to Admission medications   Medication Sig Start Date End Date Taking? Authorizing Provider  albuterol (PROVENTIL) (2.5 MG/3ML) 0.083% nebulizer solution Take 3 mLs (2.5 mg total) by nebulization every 6 (six) hours as needed for wheezing or shortness of breath. 10/28/20   [name], MD  amLODipine (NORVASC) 10 MG tablet Take 1 tablet (10 mg total) by mouth daily. 02/02/22   Fritzi Mandes, MD  aspirin EC 81 MG tablet Take 81 mg by mouth daily. Swallow whole.    [provider]  B Complex-C-Folic Acid (RENA-VITE RX) 1 MG TABS Take 1 tablet by mouth daily.  12/07/18   [provider]  BD PEN NEEDLE NANO 2ND GEN 32G X 4 MM MISC USE AS DIRECTED 02/26/22   [name], MD  calcium acetate (PHOSLO) 667 MG capsule TAKE 2 CAPSULES BY MOUTH 3 TIMES A DAY WITH MEAL 02/16/22   [name], MD  chlorproMAZINE (THORAZINE) 25 MG tablet TAKE 1 TABLET BY MOUTH THREE TIMES A DAY 02/27/22   [name], MD  Cholecalciferol (VITAMIN D3) 5000 units TABS Take 5,000 Units by mouth daily.     [provider]  Continuous  Blood Gluc Sensor (FREESTYLE LIBRE 14 DAY SENSOR) MISC APPLY EVERY 14 (FOURTEEN) DAYS. 02/02/22   [name], MD  docusate sodium (COLACE) 100 MG capsule TAKE 1 CAPSULE BY MOUTH TWICE A DAY 10/06/21   [name], MD  donepezil (ARICEPT) 10 MG tablet TAKE 1 TABLET BY MOUTH EVERY DAY 04/22/21   [name], MD  gabapentin (NEURONTIN) 300 MG capsule TAKE 1 CAPSULE BY MOUTH EVERY DAY AT BEDTIME AS DIRECTED 11/05/21   [name], MD  hydrALAZINE (APRESOLINE) 50 MG tablet Take 1 tablet (50 mg total) by mouth 3 (three) times daily. 02/02/22   Fritzi Mandes, MD   insulin detemir (LEVEMIR) 100 UNIT/ML injection Inject 0.15 mLs (15 Units total) into the skin at bedtime. 02/02/22   Fritzi Mandes, MD  ipratropium-albuterol (DUONEB) 0.5-2.5 (3) MG/3ML SOLN Take 3 mLs by nebulization every 6 (six) hours as needed. 03/05/22   [name], MD  levocetirizine (XYZAL) 5 MG tablet TAKE 1 TABLET BY MOUTH EVERY DAY IN THE EVENING 01/07/21   [name], MD  levothyroxine (SYNTHROID) 137 MCG tablet TAKE 1 TABLET BY MOUTH EVERY DAY 03/24/21   [name], MD  losartan (COZAAR) 100 MG tablet Take 1 tablet (100 mg total) by mouth every evening. 02/02/22   Fritzi Mandes, MD  metoprolol succinate (TOPROL-XL) 50 MG 24 hr tablet TAKE 1 TABLET BY MOUTH EVERY DAY 01/23/22   [name], MD  Multiple Vitamins-Minerals (MULTIVITAMIN WITH MINERALS) tablet Take 1 tablet by mouth daily.    [provider]  pantoprazole (PROTONIX) 40 MG tablet TAKE 1 TABLET BY MOUTH EVERY DAY 07/09/21   [name], MD  sevelamer carbonate (RENVELA) 2.4 g PACK Take 2.4 g by mouth daily. 02/25/22   [name], MD  simvastatin (ZOCOR) 20 MG tablet TAKE 1 TABLET BY MOUTH EVERY DAY IN THE EVENING 03/03/21   [name], MD  sitaGLIPtin (JANUVIA) 50 MG tablet Take 50 mg by mouth every evening.     [provider]  sodium chloride flush (NS) 0.9 % SOLN Inject 5 mLs into the vein daily. Flush cholecystostomy tube daily with 5 ccs of NS 01/22/22 03/23/22  [name], PA-C    Allergies: No Known Allergies  Review of Systems: Review of Systems  Constitutional:  Negative for chills and fever.  Respiratory:  Negative for shortness of breath.   Cardiovascular:  Negative for chest pain.  Gastrointestinal:  Negative for abdominal pain, nausea and vomiting.  Skin:  Negative for rash.    Physical Exam BP (!) 176/81   Pulse 98   Temp 97.9 F (36.6 C) (Oral)   Ht '5\' 4"'$  (1.626 m)   Wt 150 lb (68 kg)   SpO2 97%   BMI 25.75 kg/m  CONSTITUTIONAL: No acute distress, well  nourished. HEENT:  Normocephalic, atraumatic, extraocular motion intact. RESPIRATORY:  Lungs are clear, and breath sounds are equal bilaterally. Normal respiratory effort without pathologic use of accessory muscles. CARDIOVASCULAR: Heart is regular without murmurs, gallops, or rubs. GI: The abdomen is soft, non-distended, non-tender to palpation.  RUQ drain is in place with bilious fluid in bag, no purulence. NEUROLOGIC:  Motor and sensation is grossly normal.  Cranial nerves are grossly intact. PSYCH:  Alert and oriented to person, place and time. Affect is normal.  Labs/Imaging: CT chest/abdomen/pelvis on 01/20/22: IMPRESSION: Chest Impression: 1. No evidence of pulmonary infection.   Abdomen / Pelvis Impression: 1. Gallbladder wall thickening with pericholecystic fluid and irregular gallbladder wall enhancement. Findings are concerning for ACUTE CHOLECYSTITIS.  No radiodense gallstones are noted (potential acalculous cholecystitis versus non radiodense gallstones). 2. Bilateral renal cortical thinning.  Benign bilateral renal cysts. 3. Bladder wall thickening.  Assessment and Plan: This is a 76 y.o. male with history of acute cholecystitis s/p percutaneous cholecystostomy drain.  --Patient is doing very well from the gallbladder standpoint.  His drain is working well, without complications, without any pain, nausea, or vomiting.  The output seems to be relatively moderate.  Discussed with the patient and his wife the need for a cholangiogram to evaluate the gallbladder and cystic duct.  The patient is wondering if the drain could be removed today, but I advised him that if the cystic duct is still obstructed and I remove the drain today, he could have issues with cholecystitis again. --He's recovering well otherwise and his energy is improving, he's more clear-minded and active.  He had a bronchitis episode on 10/4, but O2 saturation is good on room air today and he's not coughing today  during evaluation.   --We'll order cholangiogram and he will follow up with me afterwards to discuss results and schedule surgery. --All of their questions have been answered.  I spent 30 minutes dedicated to the care of this patient on the date of this encounter to include pre-visit review of records, face-to-face time with the patient discussing diagnosis and management, and any post-visit coordination of care.   Melvyn Neth, [facility]

## 2022-03-09 NOTE — ED Triage Notes (Signed)
Patient to ED via [facility] from home for respiratory distress. Patient was initially 80% on RA then 96% on CPAP with EMS. Patient due to dialysis tomorrow- T/Th/S and has not missed any dialysis.

## 2022-03-09 NOTE — Hospital Course (Addendum)
[name] is a 76 year old male with history of end-stage renal disease on hemodialysis Tuesday Thursday Saturday, hypertension, insulin-dependent diabetes mellitus, neuropathy, GERD, hypothyroid, hard of hearing, who presents to the emergency department for chief concerns of dyspnea, and diaphoresis.  Initial vitals in the emergency department showed temperature of 98.1, respiration rate of 23, heart rate of 113, blood pressure 161/106, SPO2 of 82% on room air and patient was placed on BiPAP with improvement to 99% on room air.  Serum sodium is 133, potassium 4.9, chloride 95, bicarb 22, BUN of 21, serum creatinine of 7.28, nonfasting blood glucose 182, EGFR is 7, WBC 13.7, hemoglobin 9.7, platelets of 242.  Lactic acid is 1.8.  High sensitive troponin was elevated at 982.  BNP elevated 2687.5.  ED treatment: Heparin GGT, furosemide 40 mg IV one-time dose, nitroglycerin 0.5 inch.  EDP consulted nephrology and cardiology.  10/18: Troponin peaked at 2062.  Cardiology is recommending medical management. Patient remain on heparin infusion for the past 2 days, will complete 48 hours later today. Echocardiogram with EF of 40 to 45% and regional wall motion abnormalities along with moderate mitral regurgitation.  10/19: Based on his recent underlying comorbidities with pneumonia and cholecystitis s/p cholecystostomy drain in place cardiology is recommending outpatient ischemic work-up and follow-up with his cardiologist in 1 to 2 weeks after discharge.  They also added beta-blocker along with DAPT with aspirin and Plavix.  Hemoglobin decreased to 7.7 today, ordered 1 unit of PRBC to keep the hemoglobin above 8 due to recent ACS. Procalcitonin with slight improvement to 4.72.  Upper respiratory symptoms improving. Patient had his dialysis today.  10/20: Patient remained stable, on room air now.  Cardiology would like to do further ischemic work-up including cardiac catheterization as an outpatient  after recovering completely from current pneumonia.  Procalcitonin continues to improve.  Stable for discharge, he is being discharged on 2 more doses of Levaquin, 48 hours apart to complete the course. Cardiology also switched him to high intensity statin and added Plavix which he will take together with aspirin.  Patient will continue taking current medications and need to have a close follow-up with his providers for further recommendations.  Patient is high risk for decompensation and mortality based on age and underlying comorbidities with recent NSTEMI and concern of pneumonia.

## 2022-03-09 NOTE — Progress Notes (Signed)
[facility]  ROUNDING NOTE   Subjective:   Patient is a 76 year old man of Asian/Indian origin with a past medical history of ESRD, on hemodialysis on Tuesday Thursday Saturday schedule, diabetes mellitus type 2, CVA, hypothyroidism who came to the ER with chief complaint of shortness of breath  History of present illness date back to this morning when patient had shortness of breath and diaphoresis. Patient's spouse called EMS.  Patient was evaluated by EMS but then patient felt better and decided not to bring patient in the ER.  Patient went for his regular appointment and then had another episode of shortness of breath and diaphoresis.  Patient was brought to the ER Upon evaluation in the ER patient was found to be tachypneic, tachycardic, blood pressure was high and patient was desaturating with a pulse ox of around 82%.  Patient was started on BiPAP.  Nephrology was consulted for comanagement of dialysis patient. Patient was seen in the ER.  Patient continues to have shortness of breath Patient does not offer any complaint of chest pain Patient does not often complain of nausea or vomiting Patient goes to [facility] and undergoes dialysis under care of Dr. Graylon Gunning     Objective:  Vital signs in last 24 hours:  Temp:  [97.9 F (36.6 C)-98.1 F (36.7 C)] 98.1 F (36.7 C) (10/16 1901) Pulse Rate:  [97-112] 97 (10/16 1935) Resp:  [21-23] 21 (10/16 1935) BP: (133-176)/(59-107) 133/59 (10/16 1935) SpO2:  [82 %-100 %] 99 % (10/16 1935) FiO2 (%):  [40 %] 40 % (10/16 1943) Weight:  [68 kg-69.4 kg] 69.4 kg (10/16 1858)  Weight change:  Filed Weights   03/09/22 1858  Weight: 69.4 kg    Intake/Output: No intake/output data recorded.   Intake/Output this shift:  No intake/output data recorded.  Physical Exam: General: NAD  Head: Normocephalic, atraumatic. Moist oral mucosal membranes  Eyes: Anicteric  Lungs:  Moderate respiratory distress, BiPAP in  situ, Decreased breath sounds at bases  Heart: Regular rate and rhythm  Abdomen:  Soft, non-tender, nondistended, JP drain in situ  Extremities: No peripheral edema.  Neurologic: Nonfocal, moving all four extremities  Skin: No lesions  Access: Right chest PermCath    Basic Metabolic Panel: Recent Labs  Lab 03/09/22 1901  NA 133*  K 4.9  CL 95*  CO2 22  GLUCOSE 186*  BUN 21  CREATININE 7.28*  CALCIUM 8.8*    Liver Function Tests: Recent Labs  Lab 03/09/22 1901  AST 36  ALT 18  ALKPHOS 120  BILITOT 1.3*  PROT 7.5  ALBUMIN 3.1*   No results for input(s): "LIPASE", "AMYLASE" in the last 168 hours. No results for input(s): "AMMONIA" in the last 168 hours.  CBC: No results for input(s): "WBC", "NEUTROABS", "HGB", "HCT", "MCV", "PLT" in the last 168 hours.  Cardiac Enzymes: No results for input(s): "CKTOTAL", "CKMB", "CKMBINDEX", "TROPONINI" in the last 168 hours.  BNP: Invalid input(s): "POCBNP"  CBG: No results for input(s): "GLUCAP" in the last 168 hours.  Microbiology: Results for orders placed or performed during the hospital encounter of 01/18/22  Blood Culture (routine x 2)     Status: None   Collection Time: 01/18/22 11:51 AM   Specimen: BLOOD  Result Value Ref Range Status   Specimen Description BLOOD LEFT AC  Final   Special Requests   Final    BOTTLES DRAWN AEROBIC AND ANAEROBIC Blood Culture results may not be optimal due to an excessive volume of blood  received in culture bottles   Culture   Final    NO GROWTH 5 DAYS Performed at [facility], [address] [place], [name] [address]    Report Status 01/23/2022 FINAL  Final  Resp Panel by RT-PCR (Flu A&B, Covid) Anterior Nasal Swab     Status: None   Collection Time: 01/18/22 11:51 AM   Specimen: Anterior Nasal Swab  Result Value Ref Range Status   SARS Coronavirus 2 by RT PCR NEGATIVE NEGATIVE Final    Comment: (NOTE) SARS-CoV-2 target nucleic acids are NOT DETECTED.  The  SARS-CoV-2 RNA is generally detectable in upper respiratory specimens during the acute phase of infection. The lowest concentration of SARS-CoV-2 viral copies this assay can detect is 138 copies/mL. A negative result does not preclude SARS-Cov-2 infection and should not be used as the sole basis for treatment or other patient management decisions. A negative result may occur with  improper specimen collection/handling, submission of specimen other than nasopharyngeal swab, presence of viral mutation(s) within the areas targeted by this assay, and inadequate number of viral copies(<138 copies/mL). A negative result must be combined with clinical observations, patient history, and epidemiological information. The expected result is Negative.  Fact Sheet for Patients:  EntrepreneurPulse.com.au  Fact Sheet for Healthcare Providers:  IncredibleEmployment.be  This test is no t yet approved or cleared by the [place] FDA and  has been authorized for detection and/or diagnosis of SARS-CoV-2 by FDA under an Emergency Use Authorization (EUA). This EUA will remain  in effect (meaning this test can be used) for the duration of the COVID-19 declaration under Section 564(b)(1) of the Act, 21 U.S.C.section 360bbb-3(b)(1), unless the authorization is terminated  or revoked sooner.       Influenza A by PCR NEGATIVE NEGATIVE Final   Influenza B by PCR NEGATIVE NEGATIVE Final    Comment: (NOTE) The Xpert Xpress SARS-CoV-2/FLU/RSV plus assay is intended as an aid in the diagnosis of influenza from Nasopharyngeal swab specimens and should not be used as a sole basis for treatment. Nasal washings and aspirates are unacceptable for Xpert Xpress SARS-CoV-2/FLU/RSV testing.  Fact Sheet for Patients: EntrepreneurPulse.com.au  Fact Sheet for Healthcare Providers: IncredibleEmployment.be  This test is not yet approved or  cleared by the [place] FDA and has been authorized for detection and/or diagnosis of SARS-CoV-2 by FDA under an Emergency Use Authorization (EUA). This EUA will remain in effect (meaning this test can be used) for the duration of the COVID-19 declaration under Section 564(b)(1) of the Act, 21 U.S.C. section 360bbb-3(b)(1), unless the authorization is terminated or revoked.  Performed at [facility], [address] [place], [place] [address]   Blood Culture (routine x 2)     Status: None   Collection Time: 01/18/22  2:30 PM   Specimen: BLOOD  Result Value Ref Range Status   Specimen Description BLOOD RIGHT ARM  Final   Special Requests   Final    BOTTLES DRAWN AEROBIC AND ANAEROBIC Blood Culture adequate volume   Culture   Final    NO GROWTH 5 DAYS Performed at [facility], [address], [place], [place] [address]    Report Status 01/27/2022 FINAL  Final  Aerobic/Anaerobic Culture w Gram Stain (surgical/deep wound)     Status: None   Collection Time: 01/21/22  3:30 PM   Specimen: Abscess  Result Value Ref Range Status   Specimen Description   Final    ABSCESS GALL BLADDER Performed at [facility], [place], [place] [address]    Special Requests   Final    NONE Performed at [facility], [address] [place], [place] [address]    Gram Stain   Final    FEW WBC PRESENT,BOTH PMN AND MONONUCLEAR RARE GRAM POSITIVE COCCI IN PAIRS    Culture   Final    FEW ESCHERICHIA COLI MULTI-DRUG RESISTANT ORGANISM CRITICAL RESULT CALLED TO, READ BACK BY AND VERIFIED WITH: PHARMD K. Vorhees [phone] [id number] FCP MODERATE STREPTOCOCCUS GALLOLYTICUS NO ANAEROBES ISOLATED SEE SEPARATE REPORT Performed at [facility], [place] [address] [place], [place] [address]    Report Status 02/19/2022 FINAL  Final   Organism ID, Bacteria ESCHERICHIA COLI  Final   Organism ID, Bacteria STREPTOCOCCUS GALLOLYTICUS  Final       Susceptibility   Escherichia coli - MIC*    AMPICILLIN >=32 RESISTANT Resistant     CEFAZOLIN >=64 RESISTANT Resistant     CEFEPIME 4 INTERMEDIATE Intermediate     CEFTAZIDIME >=64 RESISTANT Resistant     CEFTRIAXONE >=64 RESISTANT Resistant     CIPROFLOXACIN >=4 RESISTANT Resistant     GENTAMICIN <=1 SENSITIVE Sensitive     IMIPENEM RESISTANT Resistant     TRIMETH/SULFA <=20 SENSITIVE Sensitive     AMPICILLIN/SULBACTAM >=32 RESISTANT Resistant     PIP/TAZO 64 INTERMEDIATE Intermediate     * FEW ESCHERICHIA COLI   Streptococcus gallolyticus - MIC*    PENICILLIN 0.12 SENSITIVE Sensitive     CEFTRIAXONE <=0.12 SENSITIVE Sensitive     ERYTHROMYCIN >=8 RESISTANT Resistant     LEVOFLOXACIN >=16 RESISTANT Resistant     VANCOMYCIN 0.25 SENSITIVE Sensitive     * MODERATE STREPTOCOCCUS GALLOLYTICUS  Carbapenem Resistance Panel     Status: None   Collection Time: 01/21/22  3:30 PM  Result Value Ref Range Status   Carba Resistance IMP Gene NOT DETECTED NOT DETECTED Final   Carba Resistance VIM Gene NOT DETECTED NOT DETECTED Final   Carba Resistance NDM Gene NOT DETECTED NOT DETECTED Final   Carba Resistance KPC Gene NOT DETECTED NOT DETECTED Final   Carba Resistance OXA48 Gene NOT DETECTED NOT DETECTED Final    Comment: (NOTE) Cepheid Carba-R is an FDA-cleared nucleic acid amplification test  (NAAT)for the detection and differentiation of genes encoding the  most prevalent carbapenemases in bacterial isolate samples. Carbapenemase gene identification and implementation of comprehensive  infection control measures are recommended by the CDC to prevent the  spread of the resistant organisms. Performed at [facility], [place] [address], [place], [place] [address]   Susceptibility, Aer + Anaerob     Status: Abnormal   Collection Time: 01/21/22  3:30 PM  Result Value Ref Range Status   Suscept, Aer + Anaerob Final report (A)  Corrected    Comment: (NOTE) Performed At: [facility] [address] [place], [place] [id number] Rush Farmer MD FO:[id number] CORRECTED ON 09/13 AT 0636: PREVIOUSLY REPORTED AS Preliminary report    Source of Sample   Final    [id number] [place] SUSCEPTIBILITY TESTING [facility] ABSCESS GALL BLADDER    Comment: Performed at [facility], [place] [address] [place], [place] [address]  Susceptibility Result     Status: Abnormal   Collection Time: 01/21/22  3:30 PM  Result Value Ref Range Status   Suscept Result 1 Escherichia coli (A)  Final    Comment: (NOTE) Identification performed by account, not confirmed by this laboratory. Multi-Drug Resistant Organism  Susceptibility profile is consistent with a probable ESBL.    Antimicrobial Suscept Comment  Corrected    Comment: (NOTE)      ** S = Susceptible; I = Intermediate; R = Resistant **                   P = Positive; N = Negative            MICS are expressed in micrograms per mL   Antibiotic                 RSLT#1    RSLT#2    RSLT#3    RSLT#4 Amoxicillin/Clavulanic Acid    R Ampicillin                     R Cefazolin                      R Cefepime                       I Ceftriaxone                    R Cefuroxime                     R Ciprofloxacin                  R Ertapenem                      S Gentamicin                     S Imipenem                       S Levofloxacin                   R Meropenem                      S Nitrofurantoin                 S Piperacillin/Tazobactam        R Tetracycline                   S Tobramycin                     S Trimethoprim/Sulfa             S Performed At: [facility] [employer] [place], [place] [id number] Rush Farmer MD DX:[id number]   Min Inhibitory Conc (2 Drugs)     Status: None   Collection Time: 01/21/22  3:30 PM  Result Value Ref Range Status   Min Inhibitory Conc (2 Drugs) Final report  Final    Comment: (NOTE) Performed At: [facility] [place], [place]  [id number] Rush Farmer MD TK:[id number]    Source ([place]) Gall Bladder Aspirate  Final    Comment: Performed at [facility], [place] [address] [place], [place] [address]  MIC Results (2 Drugs)     Status: None   Collection Time: 01/21/22  3:30 PM  Result Value Ref Range Status   RESULT 5 MIC (RESULT 1)                Base Name: Escherichia coli  Final  Comment: (NOTE) Identification performed by account, not confirmed by this laboratory. AVYCAZ            <=8 UG/ML = SUSCEPTIBLE TIGECYCLINE       <=2 UG/ML = SUSCEPTIBLE Performed At: [facility] [place], [place] [id number] Rush Farmer MD NK:[id number]     Coagulation Studies: No results for input(s): "LABPROT", "INR" in the last 72 hours.   Urinalysis: No results for input(s): "COLORURINE", "LABSPEC", "PHURINE", "GLUCOSEU", "HGBUR", "BILIRUBINUR", "KETONESUR", "PROTEINUR", "UROBILINOGEN", "NITRITE", "LEUKOCYTESUR" in the last 72 hours.  Invalid input(s): "APPERANCEUR"    Imaging: DG Chest Portable 1 View  Result Date: 03/09/2022 CLINICAL DATA:  Shortness of breath EXAM: PORTABLE CHEST 1 VIEW COMPARISON:  03/09/2022, CT 01/20/2022, radiograph 01/18/2022 FINDINGS: Right-sided central venous catheter tips over the right atrium. Left upper extremity vascular stent. Worsening airspace disease at the right greater than left lung base. Cardiomegaly with vascular congestion. No pneumothorax. IMPRESSION: 1. Cardiomegaly with vascular congestion and pulmonary edema 2. Worsening airspace disease at the right greater than left lung base, atelectasis versus pneumonia Electronically Signed   By: Donavan Foil M.D.   On: 03/09/2022 19:24   DG Chest 2 View  Result Date: 03/09/2022 CLINICAL DATA:  Cough EXAM: CHEST - 2 VIEW COMPARISON:  Chest x-ray dated September 18, 2021 FINDINGS: Unchanged position of right-sided central venous line. Cardiac and mediastinal contours are within normal limits. Mild bilateral  interstitial opacities. Small bilateral pleural effusions. No evidence of pneumothorax. Partially visualized percutaneous cholecystostomy tube. IMPRESSION: 1. Mild pulmonary edema. 2. Small bilateral pleural effusions. Electronically Signed   By: Yetta Glassman M.D.   On: 03/09/2022 15:49     Medications:    heparin      aspirin  324 mg Oral Once   [START ON 03/10/2022] aspirin EC  81 mg Oral Daily   heparin  4,000 Units Intravenous Once   insulin aspart  0-5 Units Subcutaneous QHS   [START ON 03/10/2022] insulin aspart  0-6 Units Subcutaneous TID WC   [START ON 03/10/2022] simvastatin  20 mg Oral q1800   acetaminophen **OR** acetaminophen, nitroGLYCERIN, ondansetron **OR** ondansetron (ZOFRAN) IV, senna-docusate  Chest x-ray done on March 09, 2022 IMPRESSION: 1. Cardiomegaly with vascular congestion and pulmonary edema 2. Worsening airspace disease at the right greater than left lung base, atelectasis versus pneumonia       Assessment/ Plan:  [name] is a 76 y.o.  male with a past medical history of hypertension, end-stage renal disease, on hemodialysis on Monday Wednesday Friday schedule, diabetes mellitus, CVA who was brought to the ER with chief complaint of weakness.  [facility]/TTS/right chest PermCath  End-stage renal disease on hemodialysis.         Patient will need emergent dialysis tonight    2. Anemia of chronic kidney disease  Lab Results  Component Value Date   HGB 9.7 (L) 02/25/2022    Hemoglobin within desired target.   3. Secondary Hyperparathyroidism: with outpatient labs: PTH 422, phosphorus 6.4, calcium 9.1 on 12/29/2021.   Lab Results  Component Value Date   CALCIUM 8.8 (L) 03/09/2022   CAION 0.83 (LL) 12/10/2020   PHOS 4.3 01/30/2022    Calcium and phosphorus within acceptable range  4.  Hypertension with chronic kidney disease.  Home regimen includes hydralazine and losartan.  Currently receiving these medications along with  metoprolol.   5. Diabetes mellitus type II with chronic kidney disease insulin dependent. Home regimen includes NovoLog and Levemir. Most recent hemoglobin  A1c is 8.1 on 01/18/2022.  Currently prescribed Januvia outpatient.  Currently held.  Sliding scale insulin managed per primary team.  6.  Acute cholecystitis seen on CT.  Surgery consulted.  IR placed percutaneous drain on 01/21/22 and stabilizing with medications.  Plan for percutaneous drain for 6 weeks.  Completed antibiotic regimen of Flagyl and Avycaz.    7.  Hyponatremia Patient has low sodium secondary to ESRD/inability to get rid of free water  8.  Elevated troponinsAcute CHF/fluid overload Patient is being admitted with chest pain/shortness of breath/elevated troponin Patient is most likely having non-ST elevation MI   Plan We will dialyze patient emergently tonight I spent   45   minutes of critical care time. Critical care time was spent personally by me for these activities-development of treatment plan with the patient/patient family, discussion with the patient's primary team, discussion with the patient ICU team, evaluation of patient response to treatment/CRRT, examination of patient, ordering and performing treatments intervention, ordering and reviewing the lab data, and continuing evaluation of patient's condition.     LOS: 0 Tari Lecount s Micole Delehanty 10/16/20238:47 PM

## 2022-03-09 NOTE — ED Notes (Signed)
Dr. Cinda Quest notified of critical troponin of 982

## 2022-03-09 NOTE — Assessment & Plan Note (Signed)
-   Possible right lower lobe with elevated procalcitonin - Azithromycin, ceftriaxone - Check procalcitonin in the AM

## 2022-03-09 NOTE — Assessment & Plan Note (Signed)
Echocardiogram with EF of 40 to 45% and regional wall motion abnormalities. - Strict I's and O's

## 2022-03-09 NOTE — Assessment & Plan Note (Signed)
-   Simvastatin was switched with atorvastatin

## 2022-03-09 NOTE — Assessment & Plan Note (Addendum)
-   On dialysis [facility] -Continue with scheduled dialysis

## 2022-03-09 NOTE — Assessment & Plan Note (Addendum)
Troponin peaked above 2000, now trending down.  Echocardiogram with regional wall motion abnormalities.  Cardiology was consulted and they are recommending medical management. -Continue heparin infusion for 48 hours -Continue with as needed nitroglycerin. -Continue with aspirin and Lipitor -Plavix was added to start after completion of heparin by cardiology

## 2022-03-10 ENCOUNTER — Inpatient Hospital Stay
Admit: 2022-03-10 | Discharge: 2022-03-10 | Disposition: A | Discharge status: Home / Self Care | Payer: Medicare HMO | Attending: Internal Medicine | Admitting: Internal Medicine

## 2022-03-10 DIAGNOSIS — I214 Non-ST elevation (NSTEMI) myocardial infarction: Secondary | ICD-10-CM | POA: Diagnosis not present

## 2022-03-10 LAB — ECHOCARDIOGRAM COMPLETE
AR max vel: 1.65 cm2
AV Area VTI: 1.73 cm2
AV Area mean vel: 1.69 cm2
AV Mean grad: 3 mmHg
AV Peak grad: 6.1 mmHg
Ao pk vel: 1.23 m/s
Area-P 1/2: 3.74 cm2
Height: 64 in
S' Lateral: 3 cm
Weight: 2447.99 oz

## 2022-03-10 LAB — CBC
HCT: 24.8 % — ABNORMAL LOW (ref 39.0–52.0)
Hemoglobin: 7.6 g/dL — ABNORMAL LOW (ref 13.0–17.0)
MCH: 26.8 pg (ref 26.0–34.0)
MCHC: 30.6 g/dL (ref 30.0–36.0)
MCV: 87.3 fL (ref 80.0–100.0)
Platelets: 263 10*3/uL (ref 150–400)
RBC: 2.84 MIL/uL — ABNORMAL LOW (ref 4.22–5.81)
RDW: 18.5 % — ABNORMAL HIGH (ref 11.5–15.5)
WBC: 16.1 10*3/uL — ABNORMAL HIGH (ref 4.0–10.5)
nRBC: 0 % (ref 0.0–0.2)

## 2022-03-10 LAB — BASIC METABOLIC PANEL
Anion gap: 9 (ref 5–15)
BUN: 26 mg/dL — ABNORMAL HIGH (ref 8–23)
CO2: 27 mmol/L (ref 22–32)
Calcium: 8.1 mg/dL — ABNORMAL LOW (ref 8.9–10.3)
Chloride: 100 mmol/L (ref 98–111)
Creatinine, Ser: 7.81 mg/dL — ABNORMAL HIGH (ref 0.61–1.24)
GFR, Estimated: 7 mL/min — ABNORMAL LOW (ref >60)
Glucose, Bld: 76 mg/dL (ref 70–99)
Potassium: 4 mmol/L (ref 3.5–5.1)
Sodium: 136 mmol/L (ref 135–145)

## 2022-03-10 LAB — HEPARIN LEVEL (UNFRACTIONATED)
Heparin Unfractionated: 0.29 IU/mL — ABNORMAL LOW (ref 0.30–0.70)
Heparin Unfractionated: 0.16 IU/mL — ABNORMAL LOW (ref 0.30–0.70)

## 2022-03-10 LAB — HEPATITIS B SURFACE ANTIBODY,QUALITATIVE: Hep B S Ab: REACTIVE — AB

## 2022-03-10 LAB — HEPATITIS B SURFACE ANTIGEN: Hepatitis B Surface Ag: NONREACTIVE

## 2022-03-10 LAB — TROPONIN I (HIGH SENSITIVITY)
Troponin I (High Sensitivity): 1805 ng/L (ref <18)
Troponin I (High Sensitivity): 2062 ng/L (ref <18)

## 2022-03-10 LAB — CBG MONITORING, ED
Glucose-Capillary: 65 mg/dL — ABNORMAL LOW (ref 70–99)
Glucose-Capillary: 92 mg/dL (ref 70–99)

## 2022-03-10 LAB — PROCALCITONIN: Procalcitonin: 2.94 ng/mL

## 2022-03-10 LAB — HEPATITIS B CORE ANTIBODY, TOTAL: Hep B Core Total Ab: REACTIVE — AB

## 2022-03-10 LAB — HEPATITIS C ANTIBODY: HCV Ab: NONREACTIVE

## 2022-03-10 MED ORDER — ANTICOAGULANT SODIUM CITRATE 4% (200MG/5ML) IV SOLN
5.0000 mL | Status: DC | PRN
  Filled 2022-03-10: qty 5

## 2022-03-10 MED ORDER — SODIUM CHLORIDE 0.9% FLUSH
5.0000 mL | Freq: Every day | INTRAVENOUS | Status: DC
  Administered 2022-03-10 – 2022-03-13 (×4): 5 mL

## 2022-03-10 MED ORDER — HEPARIN SODIUM (PORCINE) 1000 UNIT/ML DIALYSIS
1000.0000 [IU] | INTRAMUSCULAR | Status: DC | PRN
  Filled 2022-03-10: qty 1

## 2022-03-10 MED ORDER — LIDOCAINE HCL (PF) 1 % IJ SOLN: 5.0000 mL | INTRAMUSCULAR | Status: DC | PRN

## 2022-03-10 MED ORDER — ALTEPLASE 2 MG IJ SOLR
2.0000 mg | Freq: Once | INTRAMUSCULAR | Status: DC | PRN
  Filled 2022-03-10: qty 2

## 2022-03-10 MED ORDER — HEPARIN BOLUS VIA INFUSION
2000.0000 [IU] | Freq: Once | INTRAVENOUS | Status: AC
  Administered 2022-03-10: 2000 [IU] via INTRAVENOUS
  Filled 2022-03-10: qty 2000

## 2022-03-10 MED ORDER — INSULIN DETEMIR 100 UNIT/ML ~~LOC~~ SOLN
10.0000 [IU] | Freq: Every day | SUBCUTANEOUS | Status: DC
  Administered 2022-03-10 – 2022-03-12 (×3): 10 [IU] via SUBCUTANEOUS
  Filled 2022-03-10 (×4): qty 0.1

## 2022-03-10 MED ORDER — PENTAFLUOROPROP-TETRAFLUOROETH EX AERO
1.0000 | INHALATION_SPRAY | CUTANEOUS | Status: DC | PRN
  Filled 2022-03-10: qty 30

## 2022-03-10 MED ORDER — LIDOCAINE-PRILOCAINE 2.5-2.5 % EX CREA: 1.0000 | TOPICAL_CREAM | CUTANEOUS | Status: DC | PRN

## 2022-03-10 NOTE — Inpatient Diabetes Management (Signed)
Inpatient Diabetes Program Recommendations  AACE/ADA: New Consensus Statement on Inpatient Glycemic Control   Target Ranges:  Prepandial:   less than 140 mg/dL      Peak postprandial:   less than 180 mg/dL (1-2 hours)      Critically ill patients:  140 - 180 mg/dL    Latest Reference Range & Units 03/09/22 22:12 03/10/22 07:19  Glucose-Capillary 70 - 99 mg/dL 284 (H) 65 (L)   Review of Glycemic Control  Diabetes history: DM2 Outpatient Diabetes medications: Levemir 15 units QHS, Januvia 50 mg QPM Current orders for Inpatient glycemic control: Levemir 15 units QHS, Novolog 0-6 units TID with meals, Novolog 0-5 units QHS  Inpatient Diabetes Program Recommendations:    Insulin: Patient received Levemir 15 units last night and fasting CBG 65 mg/dl today. Please consider decreasing Levemir to 10 units QHS.  Thanks, Barnie Alderman, RN, MSN, [place] Diabetes Coordinator Inpatient Diabetes Program [phone] (Team Pager from 8am to [place])

## 2022-03-10 NOTE — ED Notes (Signed)
Pharmacy called regarding 0600 heparin level.  Pharmacist recommends drawing level after dialysis completed.

## 2022-03-10 NOTE — Progress Notes (Signed)
[facility]  ROUNDING NOTE   Subjective:   Patient is a 76 year old man of Asian/Indian origin with a past medical history of ESRD, on hemodialysis on Tuesday Thursday Saturday schedule, diabetes mellitus type 2, CVA, hypothyroidism who came to the ER with chief complaint of shortness of breath  History of present illness date back to this morning when patient had shortness of breath and diaphoresis. Patient's spouse called EMS.  Patient was evaluated by EMS but then patient felt better and decided not to bring patient in the ER.  Patient went for his regular appointment and then had another episode of shortness of breath and diaphoresis.  Patient was brought to the ER Upon evaluation in the ER patient was found to be tachypneic, tachycardic, blood pressure was high and patient was desaturating with a pulse ox of around 82%.  Patient was started on BiPAP.  Nephrology was consulted for comanagement of dialysis patient. Patient goes to [facility] and undergoes dialysis under care of Dr. Graylon Gunning  Patient was seen in the ER Patient feels better than yesterday   Objective:  Vital signs in last 24 hours:  Temp:  [97.7 F (36.5 C)-98.4 F (36.9 C)] 97.7 F (36.5 C) (10/17 0638) Pulse Rate:  [64-117] 74 (10/17 0700) Resp:  [13-23] 16 (10/17 0700) BP: (90-176)/(45-121) 122/84 (10/17 0700) SpO2:  [82 %-100 %] 100 % (10/17 0700) FiO2 (%):  [40 %] 40 % (10/16 1943) Weight:  [68 kg-69.4 kg] 69.4 kg (10/16 1858)  Weight change:  Filed Weights   03/09/22 1858  Weight: 69.4 kg    Intake/Output: I/O last 3 completed shifts: In: 350.7 [IV Piggyback:350.7] Out: -    Intake/Output this shift:  No intake/output data recorded.  Physical Exam: General: NAD  Head: Normocephalic, atraumatic. Moist oral mucosal membranes  Eyes: Anicteric  Lungs:  No respiratory distress, Decreased breath sounds at bases  Heart: Regular rate and rhythm  Abdomen:  Soft, non-tender,  nondistended, JP drain in situ  Extremities: No peripheral edema.  Neurologic: Nonfocal, moving all four extremities  Skin: No lesions  Access: Right chest PermCath    Basic Metabolic Panel: Recent Labs  Lab 03/09/22 1901 03/10/22 0400  NA 133* 136  K 4.9 4.0  CL 95* 100  CO2 22 27  GLUCOSE 186* 76  BUN 21 26*  CREATININE 7.28* 7.81*  CALCIUM 8.8* 8.1*    Liver Function Tests: Recent Labs  Lab 03/09/22 1901  AST 36  ALT 18  ALKPHOS 120  BILITOT 1.3*  PROT 7.5  ALBUMIN 3.1*   No results for input(s): "LIPASE", "AMYLASE" in the last 168 hours. No results for input(s): "AMMONIA" in the last 168 hours.  CBC: Recent Labs  Lab 03/09/22 2044 03/10/22 0400  WBC 25.7* 16.1*  NEUTROABS 24.1*  --   HGB 8.8* 7.6*  HCT 28.2* 24.8*  MCV 86.8 87.3  PLT 311 263    Cardiac Enzymes: No results for input(s): "CKTOTAL", "CKMB", "CKMBINDEX", "TROPONINI" in the last 168 hours.  BNP: Invalid input(s): "POCBNP"  CBG: Recent Labs  Lab 03/09/22 2212  GLUCAP 53*    Microbiology: Results for orders placed or performed during the hospital encounter of 01/18/22  Blood Culture (routine x 2)     Status: None   Collection Time: 01/18/22 11:51 AM   Specimen: BLOOD  Result Value Ref Range Status   Specimen Description BLOOD LEFT [facility]  Final   Special Requests   Final    BOTTLES DRAWN AEROBIC AND  ANAEROBIC Blood Culture results may not be optimal due to an excessive volume of blood received in culture bottles   Culture   Final    NO GROWTH 5 DAYS Performed at [facility], [address] [place], [place] [address]    Report Status 01/23/2022 FINAL  Final  Resp Panel by RT-PCR (Flu A&B, Covid) Anterior Nasal Swab     Status: None   Collection Time: 01/18/22 11:51 AM   Specimen: Anterior Nasal Swab  Result Value Ref Range Status   SARS Coronavirus 2 by RT PCR NEGATIVE NEGATIVE Final    Comment: (NOTE) SARS-CoV-2 target nucleic acids are NOT DETECTED.  The  SARS-CoV-2 RNA is generally detectable in upper respiratory specimens during the acute phase of infection. The lowest concentration of SARS-CoV-2 viral copies this assay can detect is 138 copies/mL. A negative result does not preclude SARS-Cov-2 infection and should not be used as the sole basis for treatment or other patient management decisions. A negative result may occur with  improper specimen collection/handling, submission of specimen other than nasopharyngeal swab, presence of viral mutation(s) within the areas targeted by this assay, and inadequate number of viral copies(<138 copies/mL). A negative result must be combined with clinical observations, patient history, and epidemiological information. The expected result is Negative.  Fact Sheet for Patients:  EntrepreneurPulse.com.au  Fact Sheet for Healthcare Providers:  IncredibleEmployment.be  This test is no t yet approved or cleared by the [place] FDA and  has been authorized for detection and/or diagnosis of SARS-CoV-2 by FDA under an Emergency Use Authorization (EUA). This EUA will remain  in effect (meaning this test can be used) for the duration of the COVID-19 declaration under Section 564(b)(1) of the Act, 21 U.S.C.section 360bbb-3(b)(1), unless the authorization is terminated  or revoked sooner.       Influenza A by PCR NEGATIVE NEGATIVE Final   Influenza B by PCR NEGATIVE NEGATIVE Final    Comment: (NOTE) The Xpert Xpress SARS-CoV-2/FLU/RSV plus assay is intended as an aid in the diagnosis of influenza from Nasopharyngeal swab specimens and should not be used as a sole basis for treatment. Nasal washings and aspirates are unacceptable for Xpert Xpress SARS-CoV-2/FLU/RSV testing.  Fact Sheet for Patients: EntrepreneurPulse.com.au  Fact Sheet for Healthcare Providers: IncredibleEmployment.be  This test is not yet approved or  cleared by the [place] FDA and has been authorized for detection and/or diagnosis of SARS-CoV-2 by FDA under an Emergency Use Authorization (EUA). This EUA will remain in effect (meaning this test can be used) for the duration of the COVID-19 declaration under Section 564(b)(1) of the Act, 21 U.S.C. section 360bbb-3(b)(1), unless the authorization is terminated or revoked.  Performed at [facility], [address] [place], [place] [address]   Blood Culture (routine x 2)     Status: None   Collection Time: 01/18/22  2:30 PM   Specimen: BLOOD  Result Value Ref Range Status   Specimen Description BLOOD RIGHT ARM  Final   Special Requests   Final    BOTTLES DRAWN AEROBIC AND ANAEROBIC Blood Culture adequate volume   Culture   Final    NO GROWTH 5 DAYS Performed at [name], [address] [place],  [address]    Report Status 01/27/2022 FINAL  Final  Aerobic/Anaerobic Culture w Gram Stain (surgical/deep wound)     Status: None   Collection Time: 01/21/22  3:30 PM   Specimen: Abscess  Result Value Ref Range Status  Specimen Description   Final    ABSCESS GALL BLADDER Performed at [facility], [address] [place], [place] [address]    Special Requests   Final    NONE Performed at [facility], [address] [place], [place] [address]    Gram Stain   Final    FEW WBC PRESENT,BOTH PMN AND MONONUCLEAR RARE GRAM POSITIVE COCCI IN PAIRS    Culture   Final    FEW ESCHERICHIA COLI MULTI-DRUG RESISTANT ORGANISM CRITICAL RESULT CALLED TO, READ BACK BY AND VERIFIED WITH: PHARMD K. Guthmiller [id number] FCP MODERATE STREPTOCOCCUS GALLOLYTICUS NO ANAEROBES ISOLATED SEE SEPARATE REPORT Performed at [facility], [place] [address], [place], [place] [address]    Report Status 02/19/2022 FINAL  Final   Organism ID, Bacteria ESCHERICHIA COLI  Final   Organism ID, Bacteria STREPTOCOCCUS GALLOLYTICUS  Final       Susceptibility   Escherichia coli - MIC*    AMPICILLIN >=32 RESISTANT Resistant     CEFAZOLIN >=64 RESISTANT Resistant     CEFEPIME 4 INTERMEDIATE Intermediate     CEFTAZIDIME >=64 RESISTANT Resistant     CEFTRIAXONE >=64 RESISTANT Resistant     CIPROFLOXACIN >=4 RESISTANT Resistant     GENTAMICIN <=1 SENSITIVE Sensitive     IMIPENEM RESISTANT Resistant     TRIMETH/SULFA <=20 SENSITIVE Sensitive     AMPICILLIN/SULBACTAM >=32 RESISTANT Resistant     PIP/TAZO 64 INTERMEDIATE Intermediate     * FEW ESCHERICHIA COLI   Streptococcus gallolyticus - MIC*    PENICILLIN 0.12 SENSITIVE Sensitive     CEFTRIAXONE <=0.12 SENSITIVE Sensitive     ERYTHROMYCIN >=8 RESISTANT Resistant     LEVOFLOXACIN >=16 RESISTANT Resistant     VANCOMYCIN 0.25 SENSITIVE Sensitive     * MODERATE STREPTOCOCCUS GALLOLYTICUS  Carbapenem Resistance Panel     Status: None   Collection Time: 01/21/22  3:30 PM  Result Value Ref Range Status   Carba Resistance IMP Gene NOT DETECTED NOT DETECTED Final   Carba Resistance VIM Gene NOT DETECTED NOT DETECTED Final   Carba Resistance NDM Gene NOT DETECTED NOT DETECTED Final   Carba Resistance KPC Gene NOT DETECTED NOT DETECTED Final   Carba Resistance OXA48 Gene NOT DETECTED NOT DETECTED Final    Comment: (NOTE) Cepheid Carba-R is an FDA-cleared nucleic acid amplification test  (NAAT)for the detection and differentiation of genes encoding the  most prevalent carbapenemases in bacterial isolate samples. Carbapenemase gene identification and implementation of comprehensive  infection control measures are recommended by the CDC to prevent the  spread of the resistant organisms. Performed at [facility], [place] [address], [place], [place] [address]   Susceptibility, Aer + Anaerob     Status: Abnormal   Collection Time: 01/21/22  3:30 PM  Result Value Ref Range Status   Suscept, Aer + Anaerob Final report (A)  Corrected    Comment: (NOTE) Performed At: [facility] [address] [place], [place] [id number] Rush Farmer MD PP:[id number] CORRECTED ON 09/13 AT 0636: PREVIOUSLY REPORTED AS Preliminary report    Source of Sample   Final    [id number] [place] SUSCEPTIBILITY TESTING [facility] ABSCESS GALL BLADDER    Comment: Performed at [facility], [place] [address] [place],  [address]  Susceptibility Result     Status: Abnormal   Collection Time: 01/21/22  3:30 PM  Result Value Ref Range Status   Suscept Result 1 Escherichia coli (A)  Final  Comment: (NOTE) Identification performed by account, not confirmed by this laboratory. Multi-Drug Resistant Organism Susceptibility profile is consistent with a probable ESBL.    Antimicrobial Suscept Comment  Corrected    Comment: (NOTE)      ** S = Susceptible; I = Intermediate; R = Resistant **                   P = Positive; N = Negative            MICS are expressed in micrograms per mL   Antibiotic                 RSLT#1    RSLT#2    RSLT#3    RSLT#4 Amoxicillin/Clavulanic Acid    R Ampicillin                     R Cefazolin                      R Cefepime                       I Ceftriaxone                    R Cefuroxime                     R Ciprofloxacin                  R Ertapenem                      S Gentamicin                     S Imipenem                       S Levofloxacin                   R Meropenem                      S Nitrofurantoin                 S Piperacillin/Tazobactam        R Tetracycline                   S Tobramycin                     S Trimethoprim/Sulfa             S Performed At: [facility] [employer] [place], [place] [id number] Rush Farmer MD ER:[id number]   Min Inhibitory Conc (2 Drugs)     Status: None   Collection Time: 01/21/22  3:30 PM  Result Value Ref Range Status   Min Inhibitory Conc (2 Drugs) Final report  Final    Comment: (NOTE) Performed At: [facility] [place], [place]  [id number] Rush Farmer MD OV:[id number]    Source ([place]) Gall Bladder Aspirate  Final    Comment: Performed at [facility], [place] [address] [place], [place] [address]  MIC Results (2 Drugs)     Status: None   Collection Time: 01/21/22  3:30 PM  Result Value Ref Range Status   RESULT 5 MIC (RESULT 1)  Base Name: Escherichia coli  Final    Comment: (NOTE) Identification performed by account, not confirmed by this laboratory. AVYCAZ            <=8 UG/ML = SUSCEPTIBLE TIGECYCLINE       <=2 UG/ML = SUSCEPTIBLE Performed At: [facility] [place], [place] [id number] Rush Farmer MD JS:[id number]     Coagulation Studies: Recent Labs    03/09/22 2002  LABPROT 13.6  INR 1.1     Urinalysis: No results for input(s): "COLORURINE", "LABSPEC", "PHURINE", "GLUCOSEU", "HGBUR", "BILIRUBINUR", "KETONESUR", "PROTEINUR", "UROBILINOGEN", "NITRITE", "LEUKOCYTESUR" in the last 72 hours.  Invalid input(s): "APPERANCEUR"    Imaging: DG Chest Portable 1 View  Result Date: 03/09/2022 CLINICAL DATA:  Shortness of breath EXAM: PORTABLE CHEST 1 VIEW COMPARISON:  03/09/2022, CT 01/20/2022, radiograph 01/18/2022 FINDINGS: Right-sided central venous catheter tips over the right atrium. Left upper extremity vascular stent. Worsening airspace disease at the right greater than left lung base. Cardiomegaly with vascular congestion. No pneumothorax. IMPRESSION: 1. Cardiomegaly with vascular congestion and pulmonary edema 2. Worsening airspace disease at the right greater than left lung base, atelectasis versus pneumonia Electronically Signed   By: Donavan Foil M.D.   On: 03/09/2022 19:24   DG Chest 2 View  Result Date: 03/09/2022 CLINICAL DATA:  Cough EXAM: CHEST - 2 VIEW COMPARISON:  Chest x-ray dated September 18, 2021 FINDINGS: Unchanged position of right-sided central venous line. Cardiac and mediastinal contours are within normal limits. Mild bilateral  interstitial opacities. Small bilateral pleural effusions. No evidence of pneumothorax. Partially visualized percutaneous cholecystostomy tube. IMPRESSION: 1. Mild pulmonary edema. 2. Small bilateral pleural effusions. Electronically Signed   By: Yetta Glassman M.D.   On: 03/09/2022 15:49     Medications:    anticoagulant sodium citrate     azithromycin Stopped (03/10/22 0301)   cefTRIAXone (ROCEPHIN)  IV Stopped (03/10/22 0150)   heparin 800 Units/hr (03/09/22 2111)    aspirin EC  81 mg Oral Daily   calcium acetate  1,334 mg Oral TID WC   Chlorhexidine Gluconate Cloth  6 each Topical Q0600   insulin aspart  0-5 Units Subcutaneous QHS   insulin aspart  0-6 Units Subcutaneous TID WC   insulin detemir  15 Units Subcutaneous QHS   levothyroxine  137 mcg Oral Q0600   multivitamin  1 tablet Oral Daily   pantoprazole  40 mg Oral Daily   simvastatin  20 mg Oral q1800   acetaminophen **OR** acetaminophen, albuterol, alteplase, anticoagulant sodium citrate, heparin, lidocaine (PF), lidocaine-prilocaine, nitroGLYCERIN, ondansetron **OR** ondansetron (ZOFRAN) IV, pentafluoroprop-tetrafluoroeth, senna-docusate  Chest x-ray done on March 09, 2022 IMPRESSION: 1. Cardiomegaly with vascular congestion and pulmonary edema 2. Worsening airspace disease at the right greater than left lung base, atelectasis versus pneumonia       Assessment/ Plan:  [name] is a 76 y.o.  male with a past medical history of hypertension, end-stage renal disease, on hemodialysis on Monday Wednesday Friday schedule, diabetes mellitus, CVA who was brought to the ER with chief complaint of weakness.  [facility]/TTS/right chest PermCath  End-stage renal disease on hemodialysis.         Patient was last dialyzed yesterday and emergent condition as patient was short of breath was on BiPAP, was in fluid overload We will dialyze patient again today    2. Anemia of chronic kidney disease  Lab Results   Component Value Date   HGB 7.6 (L) 03/10/2022    Hemoglobin is not  within desired target. Will follow   3. Secondary Hyperparathyroidism: with outpatient labs: PTH 422, phosphorus 6.4, calcium 9.1 on 12/29/2021.   Lab Results  Component Value Date   CALCIUM 8.1 (L) 03/10/2022   CAION 0.83 (LL) 12/10/2020   PHOS 4.3 01/30/2022    Calcium and phosphorus within acceptable range  4.  Hypertension with chronic kidney disease.  Home regimen includes hydralazine and losartan.  Currently receiving these medications along with metoprolol.   5. Diabetes mellitus type II with chronic kidney disease insulin dependent. Home regimen includes NovoLog and Levemir. Most recent hemoglobin A1c is 8.1 on 01/18/2022.  Currently prescribed Januvia outpatient.  Currently held.  Sliding scale insulin managed per primary team.  6.  Acute cholecystitis seen on CT.  Surgery consulted.  IR placed percutaneous drain on 01/21/22 and stabilizing with medications.  Plan for percutaneous drain for 6 weeks.  Completed antibiotic regimen of Flagyl and Avycaz.    7.  Hyponatremia Patient has low sodium secondary to ESRD/inability to get rid of free water  8.  Non ST elevation MI Patient is on heparin drip Patient is hemodynamically stable  9. Acute systolic CHF Patient 2D echo done on October 17 had shown patient EF was at around 40-45%   10. Community-acquired pneumonia Patient is on azithromycin and ceftriaxone Patient is clinically improving   Plan  We will dialyze patient today        LOS: 1 Tyreece Gelles s Kimyatta Lecy 10/17/20237:20 AM

## 2022-03-10 NOTE — ED Notes (Signed)
BiPap removed per patient request. Placed on 2L per [place]. Denies shortness of breath.

## 2022-03-10 NOTE — Progress Notes (Signed)
patient in ED bed .  Alert and oriented.  Informed consent signed and in chart.   Treatment initiated: [place] Treatment completed: 0730  Patient tolerated well.  Alert, without acute distress.  Hand-off given to patient's nurse.   Access used: catheter Access issues: none  Total UF removed: 2100 Medication(s) given: none Post HD VS: 97.5 117/41 75 16 100% [place] 2l Post HD weight: unable to obtain   [facility]

## 2022-03-10 NOTE — Consult Note (Signed)
ANTICOAGULATION CONSULT NOTE -   Pharmacy Consult for heparin infusion Indication: chest pain/ACS  No Known Allergies  Patient Measurements: Weight: 69.4 kg (153 lb) Heparin Dosing Weight: 68 kg  Vital Signs: Temp: 98.3 F (36.8 C) (10/17 1705) Temp Source: Oral (10/17 1705) BP: 127/75 (10/17 1900) Pulse Rate: 71 (10/17 1900)  Labs: Recent Labs    03/09/22 1901 03/09/22 2002 03/09/22 2044 03/10/22 0058 03/10/22 0400 03/10/22 0825 03/10/22 1920  HGB  --   --  8.8*  --  7.6*  --   --   HCT  --   --  28.2*  --  24.8*  --   --   PLT  --   --  311  --  263  --   --   APTT  --  31  --   --   --   --   --   LABPROT  --  13.6  --   --   --   --   --   INR  --  1.1  --   --   --   --   --   HEPARINUNFRC  --   --   --   --   --  0.29* 0.16*  CREATININE 7.28*  --   --   --  7.81*  --   --   TROPONINIHS 982*  --  1,240* 1,805* 2,062*  --   --      Estimated Creatinine Clearance: 6.7 mL/min (A) (by C-G formula based on SCr of 7.81 mg/dL (H)).   Medical History: Past Medical History:  Diagnosis Date   Clotting disorder ([place])    per daughter, "when patient travels, his feet swell"   Diabetes mellitus without complication ([place])    type II   Diabetic eye exam ([place]) 12/2019   ESRD (end stage renal disease) ([place])    MWF- [place]   GERD (gastroesophageal reflux disease)    [name] (hard of hearing)    Hyperlipidemia    Hypertension    Hypothyroidism    Thyroid disease     Medications:  No prior anticoagulation noted   Assessment:  76 y.o. male with history of ESRD on HD who had an episode of shortness of breath with sweating. Found to have elevated troponin I of 982. Pharmacy has been consulted for initiation and management of heparin infusion  10/17 0825 HL=0.29   subtherapeutic,barely  800>900u/hr 10/17 1920 HL 0.16 Subtherapeutic   Goal of Therapy:  Heparin level 0.3-0.7 units/ml Monitor platelets by anticoagulation protocol: Yes   Plan:  Heparin  subtherapeutic despite last dose increase   Heparin bolus of 2000 units x 1  Increase heparin infusion to 1200 units/hr  F/u anti-Xa level in 8 hours following rate change Continue to monitor H&H and platelets  Dorothe Pea, PharmD, BCPS Clinical Pharmacist   03/10/2022,7:51 PM

## 2022-03-10 NOTE — ED Notes (Addendum)
Pt requesting food at this time. States that he has not ate in couple of days. Pt given OJ for CBG this AM, this RN messaged attending MD to switch diet order at this time.

## 2022-03-10 NOTE — ED Notes (Signed)
Pt family at bedside stating "you need to flush and drain his bag. I do this everyday at home." Bag noted to be connected to biliary tube.

## 2022-03-10 NOTE — Progress Notes (Signed)
Patient is resting off BiPAP at this time. Vitals stable.

## 2022-03-10 NOTE — Consult Note (Signed)
ANTICOAGULATION CONSULT NOTE -   Pharmacy Consult for heparin infusion Indication: chest pain/ACS  No Known Allergies  Patient Measurements: Weight: 69.4 kg (153 lb) Heparin Dosing Weight: 68 kg  Vital Signs: Temp: 97.5 F (36.4 C) (10/17 0739) Temp Source: Oral (10/17 0739) BP: 98/47 (10/17 0815) Pulse Rate: 76 (10/17 0815)  Labs: Recent Labs    03/09/22 1901 03/09/22 2002 03/09/22 2044 03/10/22 0058 03/10/22 0400 03/10/22 0825  HGB  --   --  8.8*  --  7.6*  --   HCT  --   --  28.2*  --  24.8*  --   PLT  --   --  311  --  263  --   APTT  --  31  --   --   --   --   LABPROT  --  13.6  --   --   --   --   INR  --  1.1  --   --   --   --   HEPARINUNFRC  --   --   --   --   --  0.29*  CREATININE 7.28*  --   --   --  7.81*  --   TROPONINIHS 982*  --  1,240* 1,805* 2,062*  --     Estimated Creatinine Clearance: 6.7 mL/min (A) (by C-G formula based on SCr of 7.81 mg/dL (H)).   Medical History: Past Medical History:  Diagnosis Date   Clotting disorder ([place])    per daughter, "when patient travels, his feet swell"   Diabetes mellitus without complication ([place])    type II   Diabetic eye exam ([place]) 12/2019   ESRD (end stage renal disease) ([place])    MWF- [place]   GERD (gastroesophageal reflux disease)    [name] (hard of hearing)    Hyperlipidemia    Hypertension    Hypothyroidism    Thyroid disease     Medications:  No prior anticoagulation noted   Assessment:  76 y.o. male with history of ESRD on HD who had an episode of shortness of breath with sweating. Found to have elevated troponin I of 982. Pharmacy has been consulted for initiation and management of heparin infusion  10/17 0825 HL=0.29   subtherapeutic,barely  800>900u/hr  Goal of Therapy:  Heparin level 0.3-0.7 units/ml Monitor platelets by anticoagulation protocol: Yes   Plan:  Increase heparin infusion at 900 units/hr  F/u anti-Xa level in 8 hours Continue to monitor H&H and  platelets  Camiya Vinal A, PharmD Clinical Pharmacist   03/10/2022,9:44 AM

## 2022-03-10 NOTE — ED Notes (Signed)
Pt given orange juice for CBG 65

## 2022-03-10 NOTE — Progress Notes (Signed)
PROGRESS NOTE    Joseph Mcpherson  CBS:[id number] DOB: 1946-02-07 DOA: 03/09/2022 PCP: [name], MD    Brief Narrative:  Joseph Mcpherson is a 76 year old male with history of end-stage renal disease on hemodialysis Tuesday Thursday Saturday, hypertension, insulin-dependent diabetes mellitus, neuropathy, GERD, hypothyroid, hard of hearing, who presents to the emergency department for chief concerns of dyspnea, and diaphoresis.   Initial vitals in the emergency department showed temperature of 98.1, respiration rate of 23, heart rate of 113, blood pressure 161/106, SPO2 of 82% on room air and patient was placed on BiPAP with improvement to 99% on room air.   Serum sodium is 133, potassium 4.9, chloride 95, bicarb 22, BUN of 21, serum creatinine of 7.28, nonfasting blood glucose 182, EGFR is 7, WBC 13.7, hemoglobin 9.7, platelets of 242.   Lactic acid is 1.8.   High sensitive troponin was elevated at 982.  BNP elevated 2687.5.   ED treatment: Heparin GGT, furosemide 40 mg IV one-time dose, nitroglycerin 0.5 inch.  EDP consulted nephrology and cardiology  10/17 Had HD this am.  Consultants:  Cardiology and nephrology  Procedures: dialysis  Antimicrobials:      Subjective: Feels better.  No chest pain or chest pressure this morning.  Objective: Vitals:   03/10/22 0645 03/10/22 0700 03/10/22 0715 03/10/22 0739  BP: (!) 109/45 122/84 (!) 132/103 (!) 149/131  Pulse: 73 74 73 76  Resp: 18 16 17 20   Temp:    (!) 97.5 F (36.4 C)  TempSrc:    Oral  SpO2: 100% 100% 100% 100%  Weight:        Intake/Output Summary (Last 24 hours) at 03/10/2022 0811 Last data filed at 03/10/2022 0739 Gross per 24 hour  Intake 350.7 ml  Output 2100 ml  Net -1749.3 ml   Filed Weights   03/09/22 1858  Weight: 69.4 kg    Examination: Calm, NAD Decrease bs , no wheezing Reg s1/s2 no gallop Soft benign +bs No edema Aaoxox3  Mood and affect appropriate in current setting     Data  Reviewed: I have personally reviewed following labs and imaging studies  CBC: Recent Labs  Lab 03/09/22 2044 03/10/22 0400  WBC 25.7* 16.1*  NEUTROABS 24.1*  --   HGB 8.8* 7.6*  HCT 28.2* 24.8*  MCV 86.8 87.3  PLT 311 846   Basic Metabolic Panel: Recent Labs  Lab 03/09/22 1901 03/10/22 0400  NA 133* 136  K 4.9 4.0  CL 95* 100  CO2 22 27  GLUCOSE 186* 76  BUN 21 26*  CREATININE 7.28* 7.81*  CALCIUM 8.8* 8.1*   GFR: Estimated Creatinine Clearance: 6.7 mL/min (A) (by C-G formula based on SCr of 7.81 mg/dL (H)). Liver Function Tests: Recent Labs  Lab 03/09/22 1901  AST 36  ALT 18  ALKPHOS 120  BILITOT 1.3*  PROT 7.5  ALBUMIN 3.1*   No results for input(s): "LIPASE", "AMYLASE" in the last 168 hours. No results for input(s): "AMMONIA" in the last 168 hours. Coagulation Profile: Recent Labs  Lab 03/09/22 2002  INR 1.1   Cardiac Enzymes: No results for input(s): "CKTOTAL", "CKMB", "CKMBINDEX", "TROPONINI" in the last 168 hours. BNP (last 3 results) No results for input(s): "PROBNP" in the last 8760 hours. HbA1C: No results for input(s): "HGBA1C" in the last 72 hours. CBG: Recent Labs  Lab 03/09/22 2212 03/10/22 0719  GLUCAP 284* 65*   Lipid Profile: No results for input(s): "CHOL", "HDL", "LDLCALC", "TRIG", "CHOLHDL", "LDLDIRECT" in the last 72 hours.  Thyroid Function Tests: No results for input(s): "TSH", "T4TOTAL", "FREET4", "T3FREE", "THYROIDAB" in the last 72 hours. Anemia Panel: No results for input(s): "VITAMINB12", "FOLATE", "FERRITIN", "TIBC", "IRON", "RETICCTPCT" in the last 72 hours. Sepsis Labs: Recent Labs  Lab 03/09/22 1904 03/09/22 2002 03/09/22 2105 03/10/22 0400  PROCALCITON  --  0.67  --  2.94  LATICACIDVEN 1.8  --  1.1  --     No results found for this or any previous visit (from the past 240 hour(s)).       Radiology Studies: DG Chest Portable 1 View  Result Date: 03/09/2022 CLINICAL DATA:  Shortness of breath EXAM:  PORTABLE CHEST 1 VIEW COMPARISON:  03/09/2022, CT 01/20/2022, radiograph 01/18/2022 FINDINGS: Right-sided central venous catheter tips over the right atrium. Left upper extremity vascular stent. Worsening airspace disease at the right greater than left lung base. Cardiomegaly with vascular congestion. No pneumothorax. IMPRESSION: 1. Cardiomegaly with vascular congestion and pulmonary edema 2. Worsening airspace disease at the right greater than left lung base, atelectasis versus pneumonia Electronically Signed   By: Donavan Foil M.D.   On: 03/09/2022 19:24   DG Chest 2 View  Result Date: 03/09/2022 CLINICAL DATA:  Cough EXAM: CHEST - 2 VIEW COMPARISON:  Chest x-ray dated September 18, 2021 FINDINGS: Unchanged position of right-sided central venous line. Cardiac and mediastinal contours are within normal limits. Mild bilateral interstitial opacities. Small bilateral pleural effusions. No evidence of pneumothorax. Partially visualized percutaneous cholecystostomy tube. IMPRESSION: 1. Mild pulmonary edema. 2. Small bilateral pleural effusions. Electronically Signed   By: Yetta Glassman M.D.   On: 03/09/2022 15:49        Scheduled Meds:  aspirin EC  81 mg Oral Daily   calcium acetate  1,334 mg Oral TID WC   Chlorhexidine Gluconate Cloth  6 each Topical Q0600   insulin aspart  0-5 Units Subcutaneous QHS   insulin aspart  0-6 Units Subcutaneous TID WC   insulin detemir  15 Units Subcutaneous QHS   levothyroxine  137 mcg Oral Q0600   multivitamin  1 tablet Oral Daily   pantoprazole  40 mg Oral Daily   simvastatin  20 mg Oral q1800   Continuous Infusions:  anticoagulant sodium citrate     azithromycin Stopped (03/10/22 0301)   cefTRIAXone (ROCEPHIN)  IV Stopped (03/10/22 0150)   heparin 800 Units/hr (03/09/22 2111)    Assessment & Plan:   Principal Problem:   NSTEMI (non-ST elevated myocardial infarction) ([place]) Active Problems:   Volume overload   End stage renal disease (HCC)    Generalized weakness   Essential hypertension   Anemia in chronic kidney disease   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[place])   Insulin dependent type 2 diabetes mellitus ([place])   Hypothyroidism   Mixed hyperlipidemia   Type 2 diabetes mellitus with diabetic neuropathy, unspecified (HCC)   Neurocognitive deficits   Iron deficiency anemia, unspecified   Secondary hyperparathyroidism of renal origin ([place])   History of CVA (cerebrovascular accident)   Acute clinical systolic heart failure ([place])   CAP (community acquired pneumonia)   * NSTEMI (non-ST elevated myocardial infarction) ([place]) - Continue heparin GGT - Admit to inpatient, stepdown - Nitroglycerin-half an inch every 6 hours as needed for chest pain 10/17 cardiology consulted pending Echo pending  Volume overload Hemodialysis this a.m. Improving   End stage renal disease ([place]) - On dialysis [facility] - Nephrology has been consulted and recommends stabilization from a cardiac standpoint before dialysis can be attempted Had  HD this a.m.    Insulin dependent type 2 diabetes mellitus (HCC) BG 65 this am. Decrease lantus to 10units from 15 riss   Hypothyroidism - Patient takes levothyroxine 137 mcg daily   Mixed hyperlipidemia - Resume simvastatin 20 mg daily   CAP (community acquired pneumonia) - Possible right lower lobe with elevated procalcitonin - Azithromycin, ceftriaxone Monitor procal.   Acute clinical systolic heart failure (HCC) - Strict I's and O's - Complete echo ordered   History of CVA (cerebrovascular accident) - Resume aspirin 81 mg daily     DVT prophylaxis: Heparin drip Code Status: Full Family Communication: Son at bedside Disposition Plan:  Status is: Inpatient Remains inpatient appropriate because: IV treatment.        LOS: 1 day   Time spent: 35 minutes    Nolberto Hanlon, MD [facility] Pager [phone]  If 7PM-7AM, please contact night-coverage 03/10/2022,  8:11 AM

## 2022-03-11 ENCOUNTER — Other Ambulatory Visit: Payer: Self-pay

## 2022-03-11 DIAGNOSIS — I214 Non-ST elevation (NSTEMI) myocardial infarction: Secondary | ICD-10-CM | POA: Diagnosis not present

## 2022-03-11 LAB — RENAL FUNCTION PANEL
Albumin: 2.6 g/dL — ABNORMAL LOW (ref 3.5–5.0)
Anion gap: 11 (ref 5–15)
BUN: 19 mg/dL (ref 8–23)
CO2: 24 mmol/L (ref 22–32)
Calcium: 8.8 mg/dL — ABNORMAL LOW (ref 8.9–10.3)
Chloride: 104 mmol/L (ref 98–111)
Creatinine, Ser: 7.04 mg/dL — ABNORMAL HIGH (ref 0.61–1.24)
GFR, Estimated: 7 mL/min — ABNORMAL LOW (ref >60)
Glucose, Bld: 102 mg/dL — ABNORMAL HIGH (ref 70–99)
Phosphorus: 3.3 mg/dL (ref 2.5–4.6)
Potassium: 4.2 mmol/L (ref 3.5–5.1)
Sodium: 139 mmol/L (ref 135–145)

## 2022-03-11 LAB — CBG MONITORING, ED
Glucose-Capillary: 135 mg/dL — ABNORMAL HIGH (ref 70–99)
Glucose-Capillary: 154 mg/dL — ABNORMAL HIGH (ref 70–99)
Glucose-Capillary: 88 mg/dL (ref 70–99)
Glucose-Capillary: 107 mg/dL — ABNORMAL HIGH (ref 70–99)

## 2022-03-11 LAB — HEPARIN LEVEL (UNFRACTIONATED)
Heparin Unfractionated: 0.36 IU/mL (ref 0.30–0.70)
Heparin Unfractionated: 0.4 IU/mL (ref 0.30–0.70)

## 2022-03-11 LAB — TROPONIN I (HIGH SENSITIVITY): Troponin I (High Sensitivity): 980 ng/L (ref <18)

## 2022-03-11 LAB — CBC
HCT: 27 % — ABNORMAL LOW (ref 39.0–52.0)
Hemoglobin: 8.1 g/dL — ABNORMAL LOW (ref 13.0–17.0)
MCH: 26.4 pg (ref 26.0–34.0)
MCHC: 30 g/dL (ref 30.0–36.0)
MCV: 87.9 fL (ref 80.0–100.0)
Platelets: 284 10*3/uL (ref 150–400)
RBC: 3.07 MIL/uL — ABNORMAL LOW (ref 4.22–5.81)
RDW: 18.2 % — ABNORMAL HIGH (ref 11.5–15.5)
WBC: 11.5 10*3/uL — ABNORMAL HIGH (ref 4.0–10.5)
nRBC: 0 % (ref 0.0–0.2)

## 2022-03-11 LAB — HEPATITIS B SURFACE ANTIBODY, QUANTITATIVE: Hep B S AB Quant (Post): 115.7 m[IU]/mL (ref >9.9)

## 2022-03-11 LAB — GLUCOSE, CAPILLARY
Glucose-Capillary: 129 mg/dL — ABNORMAL HIGH (ref 70–99)
Glucose-Capillary: 147 mg/dL — ABNORMAL HIGH (ref 70–99)

## 2022-03-11 LAB — PROCALCITONIN: Procalcitonin: 5.6 ng/mL

## 2022-03-11 MED ORDER — ALUM & MAG HYDROXIDE-SIMETH 200-200-20 MG/5ML PO SUSP: 30.0000 mL | ORAL | Status: DC | PRN

## 2022-03-11 MED ORDER — METOPROLOL SUCCINATE ER 25 MG PO TB24
12.5000 mg | ORAL_TABLET | Freq: Every day | ORAL | Status: DC
  Administered 2022-03-11: 12.5 mg via ORAL
  Filled 2022-03-11: qty 0.5

## 2022-03-11 MED ORDER — FUROSEMIDE 10 MG/ML IJ SOLN
40.0000 mg | Freq: Once | INTRAMUSCULAR | Status: AC
  Administered 2022-03-11: 40 mg via INTRAVENOUS
  Filled 2022-03-11: qty 4

## 2022-03-11 MED ORDER — CALCIUM CARBONATE ANTACID 500 MG PO CHEW
1.0000 | CHEWABLE_TABLET | Freq: Two times a day (BID) | ORAL | Status: DC | PRN
  Administered 2022-03-11: 200 mg via ORAL
  Filled 2022-03-11: qty 1

## 2022-03-11 MED ORDER — ATORVASTATIN CALCIUM 20 MG PO TABS
40.0000 mg | ORAL_TABLET | Freq: Every day | ORAL | Status: DC
  Administered 2022-03-11 – 2022-03-13 (×3): 40 mg via ORAL
  Filled 2022-03-11 (×3): qty 2

## 2022-03-11 MED ORDER — CLOPIDOGREL BISULFATE 75 MG PO TABS
75.0000 mg | ORAL_TABLET | Freq: Every day | ORAL | Status: DC
  Administered 2022-03-11 – 2022-03-13 (×3): 75 mg via ORAL
  Filled 2022-03-11 (×3): qty 1

## 2022-03-11 MED ORDER — INFLUENZA VAC A&B SA ADJ QUAD 0.5 ML IM PRSY
0.5000 mL | PREFILLED_SYRINGE | INTRAMUSCULAR | Status: DC
  Filled 2022-03-11: qty 0.5

## 2022-03-11 MED ORDER — METOPROLOL SUCCINATE ER 25 MG PO TB24
25.0000 mg | ORAL_TABLET | Freq: Every day | ORAL | Status: DC
  Administered 2022-03-12 – 2022-03-13 (×2): 25 mg via ORAL
  Filled 2022-03-11 (×2): qty 1

## 2022-03-11 MED ORDER — METOPROLOL TARTRATE 25 MG PO TABS
25.0000 mg | ORAL_TABLET | Freq: Once | ORAL | Status: AC
  Administered 2022-03-11: 25 mg via ORAL
  Filled 2022-03-11: qty 1

## 2022-03-11 MED ORDER — HYDRALAZINE HCL 20 MG/ML IJ SOLN: 10.0000 mg | Freq: Three times a day (TID) | INTRAMUSCULAR | Status: DC | PRN

## 2022-03-11 NOTE — ED Notes (Signed)
Extra pillow given to pt upon request, wife at bedside

## 2022-03-11 NOTE — Assessment & Plan Note (Signed)
Hemoglobin seems stable, at 8.1 today -Continue to monitor -Transfuse if below 8 due to recent ACS

## 2022-03-11 NOTE — Progress Notes (Signed)
Progress Note   Patient: Joseph Mcpherson TZG:[id number] DOB: 1945/09/21 DOA: 03/09/2022     2 DOS: the patient was seen and examined on 03/11/2022   Brief hospital course: Mr. Nevin Grizzle is a 76 year old male with history of end-stage renal disease on hemodialysis Tuesday Thursday Saturday, hypertension, insulin-dependent diabetes mellitus, neuropathy, GERD, hypothyroid, hard of hearing, who presents to the emergency department for chief concerns of dyspnea, and diaphoresis.  Initial vitals in the emergency department showed temperature of 98.1, respiration rate of 23, heart rate of 113, blood pressure 161/106, SPO2 of 82% on room air and patient was placed on BiPAP with improvement to 99% on room air.  Serum sodium is 133, potassium 4.9, chloride 95, bicarb 22, BUN of 21, serum creatinine of 7.28, nonfasting blood glucose 182, EGFR is 7, WBC 13.7, hemoglobin 9.7, platelets of 242.  Lactic acid is 1.8.  High sensitive troponin was elevated at 982.  BNP elevated 2687.5.  ED treatment: Heparin GGT, furosemide 40 mg IV one-time dose, nitroglycerin 0.5 inch.  EDP consulted nephrology and cardiology.  10/18: Troponin peaked at 2062.  Cardiology is recommending medical management. Patient remain on heparin infusion for the past 2 days, will complete 48 hours later today. Echocardiogram with EF of 40 to 45% and regional wall motion abnormalities along with moderate mitral regurgitation.  Patient is high risk for decompensation and mortality based on age and underlying comorbidities with recent NSTEMI and concern of pneumonia. Palliative care consult.   Assessment and Plan: * NSTEMI (non-ST elevated myocardial infarction) ([place]) Troponin peaked above 2000, now trending down.  Echocardiogram with regional wall motion abnormalities.  Cardiology was consulted and they are recommending medical management. -Continue heparin infusion for 48 hours -Continue with as needed nitroglycerin. -Continue with  aspirin and Lipitor -Plavix was added to start after completion of heparin by cardiology  Volume overload Volume is being managed with dialysis. Routine dialysis on TTS Nephrology is on board.  End stage renal disease ([place]) - On dialysis [facility] -Continue with scheduled dialysis  Generalized weakness - PT/OT evaluation after completion of heparin.  Essential hypertension Blood pressure mildly elevated. -Continue metoprolol -Holding home amlodipine and hydralazine as blood pressure is becoming soft at time -Continue to monitor -As needed hydralazine  Insulin dependent type 2 diabetes mellitus (HCC) - Insulin SSI with at bedtime coverage ordered  Anemia in chronic kidney disease Hemoglobin seems stable, at 8.1 today -Continue to monitor -Transfuse if below 8 due to recent ACS  CAP (community acquired pneumonia) - Possible right lower lobe with elevated procalcitonin - Azithromycin, ceftriaxone   Hypothyroidism - Patient takes levothyroxine 137 mcg daily  Mixed hyperlipidemia - Simvastatin was switched with atorvastatin  Acute clinical systolic heart failure (HCC) Echocardiogram with EF of 40 to 45% and regional wall motion abnormalities. - Strict I's and O's   History of CVA (cerebrovascular accident) - Resume aspirin 81 mg daily and statin  Subjective: Patient was seen and examined today.  Denies any chest pain or shortness of breath.  He was saturating 100% on 2 L of oxygen, no baseline oxygen use.  Most likely placed for comfort, no recorded hypoxia.  Physical Exam: Vitals:   03/11/22 1300 03/11/22 1545 03/11/22 1549 03/11/22 1625  BP: 129/60 (!) 161/61  (!) 146/40  Pulse: 62 (!) 102  (!) 113  Resp: 19   18  Temp:   98.1 F (36.7 C) (!) 97.4 F (36.3 C)  TempSrc:      SpO2: 97% 98%  100%  Weight:    67 kg  Height:    _0  (1.626 m)   General.  Ill-appearing elderly man, in no acute distress. Pulmonary.  Lungs clear bilaterally, normal respiratory  effort. CV.  Regular rate and rhythm, no JVD, rub or murmur. Abdomen.  Soft, nontender, nondistended, BS positive.  RUQ cholecystostomy drain in place. CNS.  Alert and oriented .  No focal neurologic deficit. Extremities.  No edema, no cyanosis, pulses intact and symmetrical. Psychiatry.  Judgment and insight appears normal.  Data Reviewed: Prior data reviewed  Family Communication: Discussed with wife at bedside  Disposition: Status is: Inpatient Remains inpatient appropriate because: Severity of illness.   Planned Discharge Destination: Home with Home Health  DVT prophylaxis.  Heparin infusion Time spent: 50 minutes  This record has been created using Systems analyst. Errors have been sought and corrected,but may not always be located. Such creation errors do not reflect on the standard of care.  Author: Lorella Nimrod, MD 03/11/2022 5:40 PM  For on call review [email or web address].

## 2022-03-11 NOTE — Consult Note (Signed)
[place] NOTE       Patient ID: Joseph Mcpherson MRN: [id number] DOB/AGE: 1945/12/25 76 y.o.  Admit date: 03/09/2022 Referring Physician Dr. Lorella Nimrod Primary Physician Dr. [name] Primary Cardiologist none Reason for Consultation NSTEMI   HPI: Joseph Mcpherson is a 70JJK with a PMH of ESRD (T,TH,S HD), DM2 on insulin, GERD, hypothyroidism, history of CVA, hyperlipidemia, hard of hearing, recent admission at the end of August 2023 for acute cholecystitis s/p cholecystostomy drain who presented to [facility] ED 03/09/2022 with shortness of breath and diaphoresis.  Chest x-ray shows a right lower lobe consolidation with elevated procalcitonin and leukocytosis to 27,000 on admission concerning for CAP for which empiric antibiotics have been started.  Troponin on admission was elevated to 982 and has trended 1240, 1805, with 2062 with echocardiogram showing reduced EF 40-45% previously 60-65% 04/2019) with moderate hypokinesis of the LV mid apical inferoseptal wall, apical segment and anteroseptal segment concerning for NSTEMI.  Cardiology was formally consulted on hospital day 2 for further assistance.  History is primarily obtained from the patient's wife at bedside and the patient's daughter is present by phone.  She says that about at 11 AM on 10/16 the patient was sitting on the sofa and felt a little short of breath after walking between rooms in the home, she called EMS and they gave him a nebulizer treatment and he felt better.  He had a appointment at 130 afternoon with his general surgeon following up his cholecystostomy drain that was placed 01/21/2022 and he was walking fine and feeling good.  Around 5:30 PM the patient felt very short of breath with a productive cough and was very diaphoretic, so EMS was called again.  During that episode he denied any chest discomfort,, was primarily very short of breath.  Initially in the ED he was afebrile with an increased respiratory  rate was in sinus tachycardia with rate in the 110s, he was hypertensive with blood pressure 161/106, was hypoxic to 82% on room air and was placed on BiPAP with improvement.  He was started on a heparin drip and given a dose of IV furosemide x1 in addition to Nitropaste.  He had emergent dialysis on day of admission 10/16 and again on 10/17.  He was started on empiric antibiotics for possible right lower lobe pneumonia in addition to a heparin drip and 325 mg of aspirin.  His troponin was elevated and has up trended from [id number] [phone] with repeats pending.  Echo shows a newly reduced EF to 40 to 45% with multiple wall motion abnormalities.  At my time of evaluation the patient states he feels much better than when he first came into the hospital in terms of his breathing, he is comfortable on 3 L by nasal cannula but uses not at baseline.  He still has an occasional cough and intermittent spasms of right upper quadrant abdominal pain where his cholecystostomy tube is placed.  He denies any chest pain, dizziness, peripheral edema, increased fluid or salt intake, dietary indiscretions, or missed dialysis.  Reports he makes a little urine but does not normally take a fluid pill.  Recent vitals are notable for a blood pressure of 125/74, heart rate in the 70s in sinus rhythm on telemetry.  Other labs are notable for a procalcitonin at 5.6 uptrending from 0.67 on 10/16, improving leukocytosis from 25,700 on admission to 11,500 today.  Worsening anemia from baseline with recent hemoglobin 8.1 and hematocrit 27 with platelets stable at 284.  2 weeks ago was 9.7 and 30).  Chest x-ray shows cardiomegaly with pulmonary vascular congestion and pulmonary edema with worsening airspace disease right > left base.  He has not followed by cardiology on an outpatient basis, but noted a Lexiscan Myoview performed in 2018 that showed an ejection fraction of 60% without evidence of ischemia.  He previously used snuff but does  not smoke cigarettes or drink alcohol.  Review of systems complete and found to be negative unless listed above     Past Medical History:  Diagnosis Date   Clotting disorder ([place])    per daughter, "when patient travels, his feet swell"   Diabetes mellitus without complication ([place])    type II   Diabetic eye exam ([place]) 12/2019   ESRD (end stage renal disease) ([place])    MWF- [place]   GERD (gastroesophageal reflux disease)    [name] (hard of hearing)    Hyperlipidemia    Hypertension    Hypothyroidism    Thyroid disease     Past Surgical History:  Procedure Laterality Date   AV FISTULA PLACEMENT Right 10/22/2017   Procedure: ARTERIOVENOUS (AV) BRACHIOCEPHALIC FISTULA CREATION RIGHT UPPER ARM;  Surgeon: Conrad [place]ow [place], MD;  Location: [employer] [facility];  Service: Vascular;  Laterality: Right;   AV FISTULA PLACEMENT Left 02/03/2019   Procedure: INSERTION OF ARTERIOVENOUS (AV) GORE-TEX GRAFT ARM (BRACHIAL AXILLARY );  Surgeon: [name], MD;  Location: [facility];  Service: Vascular;  Laterality: Left;   AV FISTULA PLACEMENT Left 12/10/2020   Procedure: LEFT ARM BRACHIOCEPHALIC ARTERIOVENOUS (AV) FISTULA CREATION;  Surgeon: [name], MD;  Location: [place];  Service: Vascular;  Laterality: Left;   IR PERC CHOLECYSTOSTOMY  01/21/2022   UPPER EXTREMITY ANGIOGRAPHY Left 09/19/2019   Procedure: UPPER EXTREMITY ANGIOGRAPHY;  Surgeon: [name], MD;  Location: [place] [facility];  Service: Cardiovascular;  Laterality: Left;    (Not in a hospital admission)  Social History   Socioeconomic History   Marital status: Married    Spouse name: Not on file   Number of children: Not on file   Years of education: 12   Highest education level: 12th grade  Occupational History   Occupation: Retired  Tobacco Use   Smoking status: Never   Smokeless tobacco: Current    Types: Snuff  Vaping Use   Vaping Use: Never used  Substance and Sexual Activity   Alcohol use: No   Drug use: No    Sexual activity: Not Currently  Other Topics Concern   Not on file  Social History Narrative   Not on file   Social Determinants of Health   Financial Resource Strain: Low Risk  (06/19/2021)   Overall Financial Resource Strain (CARDIA)    Difficulty of Paying Living Expenses: Not hard at all  Food Insecurity: No Food Insecurity (01/31/2022)   Hunger Vital Sign    Worried About Running Out of Food in the Last Year: Never true    [place] in the Last Year: Never true  Transportation Needs: No Transportation Needs (01/31/2022)   PRAPARE - Hydrologist (Medical): No    Lack of Transportation (Non-Medical): No  Physical Activity: Sufficiently Active (06/19/2021)   Exercise Vital Sign    Days of Exercise per Week: 7 days    Minutes of Exercise per Session: 30 min  Stress: No Stress Concern Present (06/19/2021)   [place]  Feeling of Stress : Not at all  Social Connections: Socially Integrated (06/19/2021)   Social Connection and Isolation Panel [NHANES]    Frequency of Communication with Friends and Family: More than three times a week    Frequency of Social Gatherings with Friends and Family: More than three times a week    Attends Religious Services: More than 4 times per year    Active Member of Clubs or Organizations: Yes    Attends Archivist Meetings: More than 4 times per year    Marital Status: Married  Human resources officer Violence: Not At Risk (01/31/2022)   Humiliation, Afraid, Rape, and Kick questionnaire    Fear of Current or Ex-Partner: No    Emotionally Abused: No    Physically Abused: No    Sexually Abused: No    Family History  Problem Relation Age of Onset   Diabetes Mother    Hyperlipidemia Mother    Hypertension Father      Vitals:   03/11/22 0500 03/11/22 0600 03/11/22 0730 03/11/22 0800  BP: 126/85 122/74 130/70 95/73  Pulse: 82 68 66 69  Resp: '19 15  15 14  '$ Temp:      TempSrc:      SpO2: 100% 100% 100% 97%  Weight:        PHYSICAL EXAM General: Pleasant ill-appearing Asian male, in no acute distress.  Sitting upright in ED stretcher with wife at bedside and daughter present by phone. HEENT:  Normocephalic and atraumatic.  Somewhat hard of hearing Neck:  No JVD.  Chest: Right chest permacath placed Lungs: Somewhat short of breath appearing on 3 L by nasal cannula, bibasilar crackles  Heart: HRRR . Normal S1 and S2 without gallops or murmurs.  Abdomen: Soft, non-distended appearing slightly tender to palpation in the RUQ cholecystostomy tube present.  Msk: Normal strength and tone for age. Extremities: Warm and well perfused. No clubbing, cyanosis.  No peripheral edema.  Neuro: Alert and oriented X 3. Psych:  Answers questions appropriately.   Labs: Basic Metabolic Panel: Recent Labs    03/09/22 1901 03/10/22 0400  NA 133* 136  K 4.9 4.0  CL 95* 100  CO2 22 27  GLUCOSE 186* 76  BUN 21 26*  CREATININE 7.28* 7.81*  CALCIUM 8.8* 8.1*   Liver Function Tests: Recent Labs    03/09/22 1901  AST 36  ALT 18  ALKPHOS 120  BILITOT 1.3*  PROT 7.5  ALBUMIN 3.1*   No results for input(s): "LIPASE", "AMYLASE" in the last 72 hours. CBC: Recent Labs    03/09/22 2044 03/10/22 0400 03/11/22 0505  WBC 25.7* 16.1* 11.5*  NEUTROABS 24.1*  --   --   HGB 8.8* 7.6* 8.1*  HCT 28.2* 24.8* 27.0*  MCV 86.8 87.3 87.9  PLT 311 263 284   Cardiac Enzymes: Recent Labs    03/09/22 2044 03/10/22 0058 03/10/22 0400  TROPONINIHS 1,240* 1,805* 2,062*   BNP: Recent Labs    03/09/22 1901  BNP 2,687.5*   D-Dimer: No results for input(s): "DDIMER" in the last 72 hours. Hemoglobin A1C: No results for input(s): "HGBA1C" in the last 72 hours. Fasting Lipid Panel: No results for input(s): "CHOL", "HDL", "LDLCALC", "TRIG", "CHOLHDL", "LDLDIRECT" in the last 72 hours. Thyroid Function Tests: No results for input(s): "TSH",  "T4TOTAL", "T3FREE", "THYROIDAB" in the last 72 hours.  Invalid input(s): "FREET3" Anemia Panel: No results for input(s): "VITAMINB12", "FOLATE", "FERRITIN", "TIBC", "IRON", "RETICCTPCT" in the last 72 hours.   Radiology: ECHOCARDIOGRAM COMPLETE  Result Date: 03/10/2022    ECHOCARDIOGRAM REPORT   Patient Name:   RICARD FAULKNER Date of Exam: 03/10/2022 Medical Rec #:  [id number]   Height:       64.0 in Accession #:    [id number]  Weight:       153.0 lb Date of Birth:  04/07/1946    BSA:          1.746 m Patient Age:    51 years    BP:           113/49 mmHg Patient Gender: M           HR:           68 bpm. Exam Location:  [facility] Procedure: 2D Echo, Cardiac Doppler and Color Doppler Indications:     R06.00 Dyspnea  History:         Patient has prior history of Echocardiogram examinations, most                  recent 05/05/2019. Patient on dialysis; Risk                  Factors:Hypertension, Dyslipidemia and Diabetes.  Sonographer:     Rosalia Hammers Referring Phys:  [id number] AMY N COX Diagnosing Phys: Serafina Royals MD  Sonographer Comments: Image acquisition challenging due to respiratory motion. IMPRESSIONS  1. Left ventricular ejection fraction, by estimation, is 40 to 45%. The left ventricle has mildly decreased function. The left ventricle demonstrates regional wall motion abnormalities (see scoring diagram/findings for description). Left ventricular diastolic parameters were normal.  2. Right ventricular systolic function is normal. The right ventricular size is normal.  3. Left atrial size was mildly dilated.  4. The mitral valve is normal in structure. Moderate mitral valve regurgitation.  5. The aortic valve is normal in structure. Aortic valve regurgitation is trivial. FINDINGS  Left Ventricle: Left ventricular ejection fraction, by estimation, is 40 to 45%. The left ventricle has mildly decreased function. The left ventricle demonstrates regional wall motion abnormalities. Moderate hypokinesis of the left  ventricular, mid-apical inferoseptal wall, apical segment and anteroseptal wall. The left ventricular internal cavity size was normal in size. There is no left ventricular hypertrophy. Left ventricular diastolic parameters were normal. Right Ventricle: The right ventricular size is normal. No increase in right ventricular wall thickness. Right ventricular systolic function is normal. Left Atrium: Left atrial size was mildly dilated. Right Atrium: Right atrial size was normal in size. Pericardium: There is no evidence of pericardial effusion. Mitral Valve: The mitral valve is normal in structure. Moderate mitral valve regurgitation. Tricuspid Valve: The tricuspid valve is normal in structure. Tricuspid valve regurgitation is mild. Aortic Valve: The aortic valve is normal in structure. Aortic valve regurgitation is trivial. Aortic valve mean gradient measures 3.0 mmHg. Aortic valve peak gradient measures 6.1 mmHg. Aortic valve area, by VTI measures 1.73 cm. Pulmonic Valve: The pulmonic valve was normal in structure. Pulmonic valve regurgitation is trivial. Aorta: The aortic root and ascending aorta are structurally normal, with no evidence of dilitation. IAS/Shunts: No atrial level shunt detected by color flow Doppler.  LEFT VENTRICLE PLAX 2D LVIDd:         4.60 cm   Diastology LVIDs:         3.00 cm   LV e' medial:    5.44 cm/s LV PW:         1.30 cm   LV E/e' medial:  21.3 LV IVS:  1.20 cm   LV e' lateral:   8.27 cm/s LVOT diam:     1.80 cm   LV E/e' lateral: 14.0 LV SV:         46 LV SV Index:   27 LVOT Area:     2.54 cm  RIGHT VENTRICLE RV Basal diam:  2.80 cm RV Mid diam:    2.60 cm RV S prime:     14.10 cm/s TAPSE (M-mode): 1.2 cm LEFT ATRIUM             Index        RIGHT ATRIUM           Index LA diam:        3.90 cm 2.23 cm/m   RA Area:     15.20 cm LA Vol (A2C):   50.1 ml 28.70 ml/m  RA Volume:   39.70 ml  22.74 ml/m LA Vol (A4C):   46.3 ml 26.52 ml/m LA Biplane Vol: 51.3 ml 29.38 ml/m  AORTIC  VALVE                    PULMONIC VALVE AV Area (Vmax):    1.65 cm     PR End Diast Vel: 2.77 msec AV Area (Vmean):   1.69 cm AV Area (VTI):     1.73 cm AV Vmax:           123.00 cm/s AV Vmean:          81.600 cm/s AV VTI:            0.267 m AV Peak Grad:      6.1 mmHg AV Mean Grad:      3.0 mmHg LVOT Vmax:         79.60 cm/s LVOT Vmean:        54.200 cm/s LVOT VTI:          0.182 m LVOT/AV VTI ratio: 0.68  AORTA Ao Root diam: 2.80 cm MITRAL VALVE                TRICUSPID VALVE MV Area (PHT): 3.74 cm     TR Peak grad:   18.8 mmHg MV Decel Time: 203 msec     TR Vmax:        217.00 cm/s MV E velocity: 116.00 cm/s MV A velocity: 82.10 cm/s   SHUNTS MV E/A ratio:  1.41         Systemic VTI:  0.18 m                             Systemic Diam: 1.80 cm Serafina Royals MD Electronically signed by Serafina Royals MD Signature Date/Time: 03/10/2022/5:02:52 PM    Final    DG Chest Portable 1 View  Result Date: 03/09/2022 CLINICAL DATA:  Shortness of breath EXAM: PORTABLE CHEST 1 VIEW COMPARISON:  03/09/2022, CT 01/20/2022, radiograph 01/18/2022 FINDINGS: Right-sided central venous catheter tips over the right atrium. Left upper extremity vascular stent. Worsening airspace disease at the right greater than left lung base. Cardiomegaly with vascular congestion. No pneumothorax. IMPRESSION: 1. Cardiomegaly with vascular congestion and pulmonary edema 2. Worsening airspace disease at the right greater than left lung base, atelectasis versus pneumonia Electronically Signed   By: Donavan Foil M.D.   On: 03/09/2022 19:24   DG Chest 2 View  Result Date: 03/09/2022 CLINICAL DATA:  Cough EXAM: CHEST - 2 VIEW COMPARISON:  Chest x-ray dated  September 18, 2021 FINDINGS: Unchanged position of right-sided central venous line. Cardiac and mediastinal contours are within normal limits. Mild bilateral interstitial opacities. Small bilateral pleural effusions. No evidence of pneumothorax. Partially visualized percutaneous cholecystostomy  tube. IMPRESSION: 1. Mild pulmonary edema. 2. Small bilateral pleural effusions. Electronically Signed   By: Yetta Glassman M.D.   On: 03/09/2022 15:49    ECHO  EF 40-45% previously 60-65% 04/2019) with moderate hypokinesis of the LV mid apical inferoseptal wall, apical segment and anteroseptal segment   TELEMETRY reviewed by me (LT) 03/11/2022 : Sinus rhythm rate 70s  EKG reviewed by me: Initially sinus tachycardia rate 114 with baseline wander and slight diffuse ST depressions, repeat 10/18 shows sinus rhythm LVH with rate 74, lateral T wave inversions and in V4.  Data reviewed by me (LT) 03/11/2022: Discharge summary from hospitalization in August 2023, last outpatient PCP note, ED note, admission H&P, hospitalist progress note, CBC, BMP, troponins, ordered repeat troponin, BNP, chest x-ray, prior echo  Principal Problem:   NSTEMI (non-ST elevated myocardial infarction) [facility]) Active Problems:   Essential hypertension   Mixed hyperlipidemia   Insulin dependent type 2 diabetes mellitus ([place])   Hypothyroidism   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[place])   Type 2 diabetes mellitus with diabetic neuropathy, unspecified ([place])   Neurocognitive deficits   End stage renal disease ([place])   Anemia in chronic kidney disease   Iron deficiency anemia, unspecified   Secondary hyperparathyroidism of renal origin ([place])   History of CVA (cerebrovascular accident)   Generalized weakness   Acute clinical systolic heart failure ([place])   Volume overload   CAP (community acquired pneumonia)    ASSESSMENT AND PLAN:  Joseph Mcpherson is a 83yoM with a PMH of ESRD (T,TH,S HD), DM2 on insulin, GERD, hypothyroidism, history of CVA, hyperlipidemia, hard of hearing, recent admission at the end of August 2023 for acute cholecystitis s/p cholecystostomy drain who presented to [facility] ED 03/09/2022 with shortness of breath and diaphoresis.  Chest x-ray shows a right lower lobe consolidation with elevated  procalcitonin and leukocytosis to 27,000 on admission concerning for CAP for which empiric antibiotics have been started.  Troponin on admission was elevated to 982 and has trended 1240, 1805, with 2062 with echocardiogram showing reduced EF 40-45% previously 60-65% 04/2019) with moderate hypokinesis of the LV mid apical inferoseptal wall, apical segment and anteroseptal concerning for NSTEMI.  Cardiology was formally consulted on hospital day 2 for further assistance.  #NSTEMI Presented with shortness of breath and diaphoresis, requiring BiPAP in the ED and emergent dialysis on day of admission 10/16.  Troponin elevated to 982 and has up trended to 2062 with repeats pending, echo showing a newly reduced EF to 40 to 45% with multiple WMA's (previously 60-65% in 2020).  EKG showed sinus tachycardia with significant baseline wander but some inferior ST depressions, with repeat EKG pending today -Trend troponin until peak, then stop. -S/p 325 mg aspirin, continue 81 mg aspirin daily -We will add clopidogrel 75 mg once daily, to continue with aspirin for at least 6 months -Continue heparin GTT to complete 48 hours (end 10/18 at 2000) -Switch simvastatin to atorvastatin 40 mg daily -We will add low-dose metoprolol XL 12.5 mg as his blood pressure allows -Discussed the risk, benefits, and alternatives for further ischemic evaluation versus conservative management during this admission with the patient, his wife at bedside and daughter (by phone).  In the setting of his pneumonia with an oxygen requirement, ongoing abdominal pain with his  recent cholecystostomy tube placement, and worsening anemia from baseline (Hgb 8.1 today), they strongly prefer continued conservative management at this time and aggressive risk factor modification and outpatient follow-up with cardiology, which is certainly reasonable.  #Acute HFrEF (EF 40-45% with multiple WMA's) Pulmonary edema and vascular congestion noted on admission  chest x-ray, BNP elevated to 2700 on admission, initially requiring BiPAP and is now on 3 L.   -He received emergent dialysis on Monday 10/16 and again on 10/17 for volume removal, he makes some urine at baseline, so we will dose IV Lasix 40 mg x 1 today -Initiate GDMT with metoprolol XL 12.5 mg, will escalate GDMT per clinical course as his blood pressure allows.Marland Kitchen  #Acute hypoxemic respiratory failure #Community-acquired pneumonia Leukocytosis to 25,700 on admission, chest x-ray with evidence of right lower lobe consolidation, required BiPAP on presentation and urgent hemodialysis on Monday, requiring 3 L by nasal cannula on hospital day 2. He is on empiric azithromycin and ceftriaxone per primary team.  #ESRD on T, TH, S HD Has been on hemodialysis for the past 3-1/2 years per his wife, denies missed treatments. -Management per nephrology  #Recent acute cholecystitis s/p cholecystostomy tube placed 8/30 Was treated conservatively with antibiotics and cholecystostomy drain, has a follow-up with general surgery on 10/23 for more imaging and definitive decision regarding surgical management.  This patient's plan of care was discussed and created with Dr. Clayborn Bigness and he is in agreement.  Signed: Tristan Schroeder , PA-C 03/11/2022, 10:05 AM [facility]

## 2022-03-11 NOTE — Consult Note (Signed)
ANTICOAGULATION CONSULT NOTE -   Pharmacy Consult for heparin infusion Indication: chest pain/ACS  No Known Allergies  Patient Measurements: Height: '5\' 4"'$  (162.6 cm) Weight: 67 kg (147 lb 11.3 oz) IBW/kg (Calculated) : 59.2 Heparin Dosing Weight: 68 kg  Vital Signs: Temp: 97.4 F (36.3 C) (10/18 1625) Temp Source: Oral (10/18 1112) BP: 146/40 (10/18 1625) Pulse Rate: 113 (10/18 1625)  Labs: Recent Labs    03/09/22 1901 03/09/22 1901 03/09/22 2002 03/09/22 2044 03/10/22 0058 03/10/22 0400 03/10/22 0825 03/10/22 1920 03/11/22 0505 03/11/22 1604  HGB  --    < >  --  8.8*  --  7.6*  --   --  8.1*  --   HCT  --   --   --  28.2*  --  24.8*  --   --  27.0*  --   PLT  --   --   --  311  --  263  --   --  284  --   APTT  --   --  31  --   --   --   --   --   --   --   LABPROT  --   --  13.6  --   --   --   --   --   --   --   INR  --   --  1.1  --   --   --   --   --   --   --   HEPARINUNFRC  --   --   --   --   --   --    < > 0.16* 0.36 0.40  CREATININE 7.28*  --   --   --   --  7.81*  --   --  7.04*  --   TROPONINIHS 982*  --   --  1,240* 1,805* 2,062*  --   --  980*  --    < > = values in this interval not displayed.     Estimated Creatinine Clearance: 7.5 mL/min (A) (by C-G formula based on SCr of 7.04 mg/dL (H)).   Medical History: Past Medical History:  Diagnosis Date   Clotting disorder ([place])    per daughter, "when patient travels, his feet swell"   Diabetes mellitus without complication ([place])    type II   Diabetic eye exam ([place]) 12/2019   ESRD (end stage renal disease) ([place])    MWF- [place]   GERD (gastroesophageal reflux disease)    [name] (hard of hearing)    Hyperlipidemia    Hypertension    Hypothyroidism    Thyroid disease     Medications:  No prior anticoagulation noted   Assessment: 76 y.o. male with history of ESRD-HD who had an episode of shortness of breath with sweating. Found to have elevated troponin I of 982. Pharmacy has been  consulted for initiation and management of heparin infusion. Hgb, Hct, PLT stable. Per cardiology, patient will complete 48 hrs of heparin drip, to stop 10/18 @ 2000.  10/17 0825 HL 0.29   subtherapeutic,barely  800>900u/hr 10/17 1920 HL 0.16 Subtherapeutic 10/18 0505 HL 0.36 Therapeutic x 1 10/18 1604 HL 0.40 Therapeutic x 2  Baseline Labs: aPTT - 31; INR - 1.1 Hgb - 8.8; Plts - 311  Goal of Therapy:  Heparin level 0.3-0.7 units/ml Monitor platelets by anticoagulation protocol: Yes   Plan: Continue heparin infusion at 1200 un/hr Heparin stop time @ 2000 tonight, 10/18,  to complete 48 hrs of heparin per cardiology consult No additional HL checks before drip stops Continue to monitor CBC daily while on heparin  Dara Hoyer, PharmD PGY-1 Pharmacy Resident 03/11/2022 4:58 PM

## 2022-03-11 NOTE — Consult Note (Signed)
ANTICOAGULATION CONSULT NOTE -   Pharmacy Consult for heparin infusion Indication: chest pain/ACS  No Known Allergies  Patient Measurements: Weight: 69.4 kg (153 lb) Heparin Dosing Weight: 68 kg  Vital Signs: Temp: 98 F (36.7 C) (10/18 0324) Temp Source: Oral (10/18 0008) BP: 95/73 (10/18 0800) Pulse Rate: 69 (10/18 0800)  Labs: Recent Labs    03/09/22 1901 03/09/22 1901 03/09/22 2002 03/09/22 2044 03/10/22 0058 03/10/22 0400 03/10/22 0825 03/10/22 1920 03/11/22 0505  HGB  --    < >  --  8.8*  --  7.6*  --   --  8.1*  HCT  --   --   --  28.2*  --  24.8*  --   --  27.0*  PLT  --   --   --  311  --  263  --   --  284  APTT  --   --  31  --   --   --   --   --   --   LABPROT  --   --  13.6  --   --   --   --   --   --   INR  --   --  1.1  --   --   --   --   --   --   HEPARINUNFRC  --   --   --   --   --   --  0.29* 0.16* 0.36  CREATININE 7.28*  --   --   --   --  7.81*  --   --   --   TROPONINIHS 982*  --   --  1,240* 1,805* 2,062*  --   --   --    < > = values in this interval not displayed.     Estimated Creatinine Clearance: 6.7 mL/min (A) (by C-G formula based on SCr of 7.81 mg/dL (H)).   Medical History: Past Medical History:  Diagnosis Date   Clotting disorder ([place])    per daughter, "when patient travels, his feet swell"   Diabetes mellitus without complication ([place])    type II   Diabetic eye exam ([place]) 12/2019   ESRD (end stage renal disease) ([place])    MWF- [place]   GERD (gastroesophageal reflux disease)    [name] (hard of hearing)    Hyperlipidemia    Hypertension    Hypothyroidism    Thyroid disease     Medications:  No prior anticoagulation noted   Assessment: 76 y.o. male with history of ESRD-HD who had an episode of shortness of breath with sweating. Found to have elevated troponin I of 982. Pharmacy has been consulted for initiation and management of heparin infusion. Hgb, Hct, PLT stable.  10/17 0825 HL 0.29   subtherapeutic,barely   800>900u/hr 10/17 1920 HL 0.16 Subtherapeutic 10/18 0505 HL 0.36 Therapeutic x 1  Baseline Labs: aPTT - 31; INR - 1.1 Hgb - 8.8; Plts - 311  Goal of Therapy:  Heparin level 0.3-0.7 units/ml Monitor platelets by anticoagulation protocol: Yes   Plan: Continue heparin infusion at 1200 un/hr Check anti-Xa level in 8 hours  Continue to monitor H&H and platelets  Dara Hoyer, PharmD PGY-1 Pharmacy Resident 03/11/2022 9:57 AM

## 2022-03-11 NOTE — ED Notes (Signed)
CBG 154. 

## 2022-03-11 NOTE — ED Notes (Signed)
Dr Reesa Chew at bedside, verbal order received to remove pt from O2, due to his 100%

## 2022-03-11 NOTE — Assessment & Plan Note (Signed)
Blood pressure mildly elevated. -Continue metoprolol -Holding home amlodipine and hydralazine as blood pressure is becoming soft at time -Continue to monitor -As needed hydralazine

## 2022-03-11 NOTE — Progress Notes (Signed)
[facility]  ROUNDING NOTE   Subjective:   Patient is a 76 year old man of Asian/Indian origin with a past medical history of ESRD, on hemodialysis on Tuesday Thursday Saturday schedule, diabetes mellitus type 2, CVA, hypothyroidism who came to the ER with chief complaint of shortness of breath  History of present illness date back to this morning when patient had shortness of breath and diaphoresis. Patient's spouse called EMS.  Patient was evaluated by EMS but then patient felt better and decided not to bring patient in the ER.  Patient went for his regular appointment and then had another episode of shortness of breath and diaphoresis.  Patient was brought to the ER Upon evaluation in the ER patient was found to be tachypneic, tachycardic, blood pressure was high and patient was desaturating with a pulse ox of around 82%.  Patient was started on BiPAP.  Nephrology was consulted for comanagement of dialysis patient. Patient goes to [facility] and undergoes dialysis under care of Dr. Graylon Gunning  Patient was seen in the ER Patient feels better than yesterday   Objective:  Vital signs in last 24 hours:  Temp:  [98 F (36.7 C)-98.3 F (36.8 C)] 98 F (36.7 C) (10/18 0324) Pulse Rate:  [63-87] 68 (10/18 0600) Resp:  [14-21] 15 (10/18 0600) BP: (86-166)/(41-118) 122/74 (10/18 0600) SpO2:  [97 %-100 %] 100 % (10/18 0600)  Weight change:  Filed Weights   03/09/22 1858  Weight: 69.4 kg    Intake/Output: I/O last 3 completed shifts: In: 455.7 [I.V.:5; IV Piggyback:450.7] Out: 2450 [Other:2450]   Intake/Output this shift:  No intake/output data recorded.  Physical Exam: General: NAD  Head: Normocephalic, atraumatic. Moist oral mucosal membranes  Eyes: Anicteric  Lungs:  No respiratory distress, Decreased breath sounds at bases  Heart: Regular rate and rhythm  Abdomen:  Soft, non-tender, nondistended, JP drain in situ  Extremities: No peripheral edema.   Neurologic: Nonfocal, moving all four extremities  Skin: No lesions  Access: Right chest PermCath    Basic Metabolic Panel: Recent Labs  Lab 03/09/22 1901 03/10/22 0400  NA 133* 136  K 4.9 4.0  CL 95* 100  CO2 22 27  GLUCOSE 186* 76  BUN 21 26*  CREATININE 7.28* 7.81*  CALCIUM 8.8* 8.1*    Liver Function Tests: Recent Labs  Lab 03/09/22 1901  AST 36  ALT 18  ALKPHOS 120  BILITOT 1.3*  PROT 7.5  ALBUMIN 3.1*   No results for input(s): "LIPASE", "AMYLASE" in the last 168 hours. No results for input(s): "AMMONIA" in the last 168 hours.  CBC: Recent Labs  Lab 03/09/22 2044 03/10/22 0400 03/11/22 0505  WBC 25.7* 16.1* 11.5*  NEUTROABS 24.1*  --   --   HGB 8.8* 7.6* 8.1*  HCT 28.2* 24.8* 27.0*  MCV 86.8 87.3 87.9  PLT 311 263 284    Cardiac Enzymes: No results for input(s): "CKTOTAL", "CKMB", "CKMBINDEX", "TROPONINI" in the last 168 hours.  BNP: Invalid input(s): "POCBNP"  CBG: Recent Labs  Lab 03/09/22 2212 03/10/22 0719 03/10/22 1153 03/10/22 1703 03/10/22 2114  GLUCAP 284* 65* 92 135* 154*    Microbiology: Results for orders placed or performed during the hospital encounter of 01/18/22  Blood Culture (routine x 2)     Status: None   Collection Time: 01/18/22 11:51 AM   Specimen: BLOOD  Result Value Ref Range Status   Specimen Description BLOOD LEFT [facility]  Final   Special Requests   Final  BOTTLES DRAWN AEROBIC AND ANAEROBIC Blood Culture results may not be optimal due to an excessive volume of blood received in culture bottles   Culture   Final    NO GROWTH 5 DAYS Performed at [facility], [name] [place], [place] [address]    Report Status 01/23/2022 FINAL  Final  Resp Panel by RT-PCR (Flu A&B, Covid) Anterior Nasal Swab     Status: None   Collection Time: 01/18/22 11:51 AM   Specimen: Anterior Nasal Swab  Result Value Ref Range Status   SARS Coronavirus 2 by RT PCR NEGATIVE NEGATIVE Final    Comment:  (NOTE) SARS-CoV-2 target nucleic acids are NOT DETECTED.  The SARS-CoV-2 RNA is generally detectable in upper respiratory specimens during the acute phase of infection. The lowest concentration of SARS-CoV-2 viral copies this assay can detect is 138 copies/mL. A negative result does not preclude SARS-Cov-2 infection and should not be used as the sole basis for treatment or other patient management decisions. A negative result may occur with  improper specimen collection/handling, submission of specimen other than nasopharyngeal swab, presence of viral mutation(s) within the areas targeted by this assay, and inadequate number of viral copies(<138 copies/mL). A negative result must be combined with clinical observations, patient history, and epidemiological information. The expected result is Negative.  Fact Sheet for Patients:  EntrepreneurPulse.com.au  Fact Sheet for Healthcare Providers:  IncredibleEmployment.be  This test is no t yet approved or cleared by the [place] FDA and  has been authorized for detection and/or diagnosis of SARS-CoV-2 by FDA under an Emergency Use Authorization (EUA). This EUA will remain  in effect (meaning this test can be used) for the duration of the COVID-19 declaration under Section 564(b)(1) of the Act, 21 U.S.C.section 360bbb-3(b)(1), unless the authorization is terminated  or revoked sooner.       Influenza A by PCR NEGATIVE NEGATIVE Final   Influenza B by PCR NEGATIVE NEGATIVE Final    Comment: (NOTE) The Xpert Xpress SARS-CoV-2/FLU/RSV plus assay is intended as an aid in the diagnosis of influenza from Nasopharyngeal swab specimens and should not be used as a sole basis for treatment. Nasal washings and aspirates are unacceptable for Xpert Xpress SARS-CoV-2/FLU/RSV testing.  Fact Sheet for Patients: EntrepreneurPulse.com.au  Fact Sheet for Healthcare  Providers: IncredibleEmployment.be  This test is not yet approved or cleared by the [place] FDA and has been authorized for detection and/or diagnosis of SARS-CoV-2 by FDA under an Emergency Use Authorization (EUA). This EUA will remain in effect (meaning this test can be used) for the duration of the COVID-19 declaration under Section 564(b)(1) of the Act, 21 U.S.C. section 360bbb-3(b)(1), unless the authorization is terminated or revoked.  Performed at [facility], [address] [place], [place] [address]   Blood Culture (routine x 2)     Status: None   Collection Time: 01/18/22  2:30 PM   Specimen: BLOOD  Result Value Ref Range Status   Specimen Description BLOOD RIGHT ARM  Final   Special Requests   Final    BOTTLES DRAWN AEROBIC AND ANAEROBIC Blood Culture adequate volume   Culture   Final    NO GROWTH 5 DAYS Performed at [facility], [address], [place], [place] [address]    Report Status 01/27/2022 FINAL  Final  Aerobic/Anaerobic Culture w Gram Stain (surgical/deep wound)     Status: None   Collection Time: 01/21/22  3:30 PM   Specimen: Abscess  Result Value Ref  Range Status   Specimen Description   Final    ABSCESS GALL BLADDER Performed at [facility], [address] [place], [place] [address]    Special Requests   Final    NONE Performed at [facility], [address] [place], [place] [address]    Gram Stain   Final    FEW WBC PRESENT,BOTH PMN AND MONONUCLEAR RARE GRAM POSITIVE COCCI IN PAIRS    Culture   Final    FEW ESCHERICHIA COLI MULTI-DRUG RESISTANT ORGANISM CRITICAL RESULT CALLED TO, READ BACK BY AND VERIFIED WITH: PHARMD K. Atkin [id number] FCP MODERATE STREPTOCOCCUS GALLOLYTICUS NO ANAEROBES ISOLATED SEE SEPARATE REPORT Performed at [facility], [place] [address], [place], [place] [address]    Report Status 02/19/2022 FINAL  Final   Organism ID, Bacteria ESCHERICHIA  COLI  Final   Organism ID, Bacteria STREPTOCOCCUS GALLOLYTICUS  Final      Susceptibility   Escherichia coli - MIC*    AMPICILLIN >=32 RESISTANT Resistant     CEFAZOLIN >=64 RESISTANT Resistant     CEFEPIME 4 INTERMEDIATE Intermediate     CEFTAZIDIME >=64 RESISTANT Resistant     CEFTRIAXONE >=64 RESISTANT Resistant     CIPROFLOXACIN >=4 RESISTANT Resistant     GENTAMICIN <=1 SENSITIVE Sensitive     IMIPENEM RESISTANT Resistant     TRIMETH/SULFA <=20 SENSITIVE Sensitive     AMPICILLIN/SULBACTAM >=32 RESISTANT Resistant     PIP/TAZO 64 INTERMEDIATE Intermediate     * FEW ESCHERICHIA COLI   Streptococcus gallolyticus - MIC*    PENICILLIN 0.12 SENSITIVE Sensitive     CEFTRIAXONE <=0.12 SENSITIVE Sensitive     ERYTHROMYCIN >=8 RESISTANT Resistant     LEVOFLOXACIN >=16 RESISTANT Resistant     VANCOMYCIN 0.25 SENSITIVE Sensitive     * MODERATE STREPTOCOCCUS GALLOLYTICUS  Carbapenem Resistance Panel     Status: None   Collection Time: 01/21/22  3:30 PM  Result Value Ref Range Status   Carba Resistance IMP Gene NOT DETECTED NOT DETECTED Final   Carba Resistance VIM Gene NOT DETECTED NOT DETECTED Final   Carba Resistance NDM Gene NOT DETECTED NOT DETECTED Final   Carba Resistance KPC Gene NOT DETECTED NOT DETECTED Final   Carba Resistance OXA48 Gene NOT DETECTED NOT DETECTED Final    Comment: (NOTE) Cepheid Carba-R is an FDA-cleared nucleic acid amplification test  (NAAT)for the detection and differentiation of genes encoding the  most prevalent carbapenemases in bacterial isolate samples. Carbapenemase gene identification and implementation of comprehensive  infection control measures are recommended by the CDC to prevent the  spread of the resistant organisms. Performed at [facility], [place] [address], [place], [place] [address]   Susceptibility, Aer + Anaerob     Status: Abnormal   Collection Time: 01/21/22  3:30 PM  Result Value Ref Range Status   Suscept, Aer + Anaerob  Final report (A)  Corrected    Comment: (NOTE) Performed At: [facility] [address] [place], [place] [id number] Rush Farmer MD FE:[id number] CORRECTED ON 09/13 AT 0636: PREVIOUSLY REPORTED AS Preliminary report    Source of Sample   Final    [id number] [place] SUSCEPTIBILITY TESTING [facility] ABSCESS GALL BLADDER    Comment: Performed at [facility], [place] [address] [place], [place] [address]  Susceptibility Result     Status: Abnormal   Collection Time: 01/21/22  3:30 PM  Result Value Ref Range Status   Suscept Result 1 Escherichia coli (A)  Final    Comment: (NOTE) Identification performed by account, not confirmed by this laboratory. Multi-Drug Resistant Organism Susceptibility profile is consistent with a probable ESBL.    Antimicrobial Suscept Comment  Corrected    Comment: (NOTE)      ** S = Susceptible; I = Intermediate; R = Resistant **                   P = Positive; N = Negative            MICS are expressed in micrograms per mL   Antibiotic                 RSLT#1    RSLT#2    RSLT#3    RSLT#4 Amoxicillin/Clavulanic Acid    R Ampicillin                     R Cefazolin                      R Cefepime                       I Ceftriaxone                    R Cefuroxime                     R Ciprofloxacin                  R Ertapenem                      S Gentamicin                     S Imipenem                       S Levofloxacin                   R Meropenem                      S Nitrofurantoin                 S Piperacillin/Tazobactam        R Tetracycline                   S Tobramycin                     S Trimethoprim/Sulfa             S Performed At: [facility] [employer] [place], [place] [id number] Rush Farmer MD [id number]   Min Inhibitory Conc (2 Drugs)     Status: None   Collection Time: 01/21/22  3:30 PM  Result Value Ref Range Status   Min Inhibitory Conc (2 Drugs) Final report  Final    Comment:  (NOTE) Performed At: [facility] [place], [place] [id number] Rush Farmer MD NU:[id number]    Source ([place]) Gall Bladder Aspirate  Final    Comment: Performed at [facility], [place] [address] [place], [place] [address]  MIC Results (2 Drugs)     Status: None   Collection Time: 01/21/22  3:30 PM  Result Value Ref Range Status   RESULT 5 MIC (RESULT 1)  Base Name: Escherichia coli  Final    Comment: (NOTE) Identification performed by account, not confirmed by this laboratory. AVYCAZ            <=8 UG/ML = SUSCEPTIBLE TIGECYCLINE       <=2 UG/ML = SUSCEPTIBLE Performed At: [facility] [place], [place] [id number] Rush Farmer MD FU:[id number]     Coagulation Studies: Recent Labs    03/09/22 2002  LABPROT 13.6  INR 1.1     Urinalysis: No results for input(s): "COLORURINE", "LABSPEC", "PHURINE", "GLUCOSEU", "HGBUR", "BILIRUBINUR", "KETONESUR", "PROTEINUR", "UROBILINOGEN", "NITRITE", "LEUKOCYTESUR" in the last 72 hours.  Invalid input(s): "APPERANCEUR"    Imaging: ECHOCARDIOGRAM COMPLETE  Result Date: 03/10/2022    ECHOCARDIOGRAM REPORT   Patient Name:   PLATON AROCHO Date of Exam: 03/10/2022 Medical Rec #:  [id number]   Height:       64.0 in Accession #:    [id number]  Weight:       153.0 lb Date of Birth:  03-23-1946    BSA:          1.746 m Patient Age:    16 years    BP:           113/49 mmHg Patient Gender: M           HR:           68 bpm. Exam Location:  [facility] Procedure: 2D Echo, Cardiac Doppler and Color Doppler Indications:     R06.00 Dyspnea  History:         Patient has prior history of Echocardiogram examinations, most                  recent 05/05/2019. Patient on dialysis; Risk                  Factors:Hypertension, Dyslipidemia and Diabetes.  Sonographer:     Rosalia Hammers Referring Phys:  [id number] AMY N COX Diagnosing Phys: Serafina Royals MD  Sonographer Comments: Image acquisition challenging due to  respiratory motion. IMPRESSIONS  1. Left ventricular ejection fraction, by estimation, is 40 to 45%. The left ventricle has mildly decreased function. The left ventricle demonstrates regional wall motion abnormalities (see scoring diagram/findings for description). Left ventricular diastolic parameters were normal.  2. Right ventricular systolic function is normal. The right ventricular size is normal.  3. Left atrial size was mildly dilated.  4. The mitral valve is normal in structure. Moderate mitral valve regurgitation.  5. The aortic valve is normal in structure. Aortic valve regurgitation is trivial. FINDINGS  Left Ventricle: Left ventricular ejection fraction, by estimation, is 40 to 45%. The left ventricle has mildly decreased function. The left ventricle demonstrates regional wall motion abnormalities. Moderate hypokinesis of the left ventricular, mid-apical inferoseptal wall, apical segment and anteroseptal wall. The left ventricular internal cavity size was normal in size. There is no left ventricular hypertrophy. Left ventricular diastolic parameters were normal. Right Ventricle: The right ventricular size is normal. No increase in right ventricular wall thickness. Right ventricular systolic function is normal. Left Atrium: Left atrial size was mildly dilated. Right Atrium: Right atrial size was normal in size. Pericardium: There is no evidence of pericardial effusion. Mitral Valve: The mitral valve is normal in structure. Moderate mitral valve regurgitation. Tricuspid Valve: The tricuspid valve is normal in structure. Tricuspid valve regurgitation is mild. Aortic Valve: The aortic valve is normal in structure. Aortic valve regurgitation is trivial. Aortic valve mean gradient measures 3.0 mmHg. Aortic valve peak  gradient measures 6.1 mmHg. Aortic valve area, by VTI measures 1.73 cm. Pulmonic Valve: The pulmonic valve was normal in structure. Pulmonic valve regurgitation is trivial. Aorta: The aortic root  and ascending aorta are structurally normal, with no evidence of dilitation. IAS/Shunts: No atrial level shunt detected by color flow Doppler.  LEFT VENTRICLE PLAX 2D LVIDd:         4.60 cm   Diastology LVIDs:         3.00 cm   LV e' medial:    5.44 cm/s LV PW:         1.30 cm   LV E/e' medial:  21.3 LV IVS:        1.20 cm   LV e' lateral:   8.27 cm/s LVOT diam:     1.80 cm   LV E/e' lateral: 14.0 LV SV:         46 LV SV Index:   27 LVOT Area:     2.54 cm  RIGHT VENTRICLE RV Basal diam:  2.80 cm RV Mid diam:    2.60 cm RV S prime:     14.10 cm/s TAPSE (M-mode): 1.2 cm LEFT ATRIUM             Index        RIGHT ATRIUM           Index LA diam:        3.90 cm 2.23 cm/m   RA Area:     15.20 cm LA Vol (A2C):   50.1 ml 28.70 ml/m  RA Volume:   39.70 ml  22.74 ml/m LA Vol (A4C):   46.3 ml 26.52 ml/m LA Biplane Vol: 51.3 ml 29.38 ml/m  AORTIC VALVE                    PULMONIC VALVE AV Area (Vmax):    1.65 cm     PR End Diast Vel: 2.77 msec AV Area (Vmean):   1.69 cm AV Area (VTI):     1.73 cm AV Vmax:           123.00 cm/s AV Vmean:          81.600 cm/s AV VTI:            0.267 m AV Peak Grad:      6.1 mmHg AV Mean Grad:      3.0 mmHg LVOT Vmax:         79.60 cm/s LVOT Vmean:        54.200 cm/s LVOT VTI:          0.182 m LVOT/AV VTI ratio: 0.68  AORTA Ao Root diam: 2.80 cm MITRAL VALVE                TRICUSPID VALVE MV Area (PHT): 3.74 cm     TR Peak grad:   18.8 mmHg MV Decel Time: 203 msec     TR Vmax:        217.00 cm/s MV E velocity: 116.00 cm/s MV A velocity: 82.10 cm/s   SHUNTS MV E/A ratio:  1.41         Systemic VTI:  0.18 m                             Systemic Diam: 1.80 cm Serafina Royals MD Electronically signed by Serafina Royals MD Signature Date/Time: 03/10/2022/5:02:52 PM    Final    DG Chest  Portable 1 View  Result Date: 03/09/2022 CLINICAL DATA:  Shortness of breath EXAM: PORTABLE CHEST 1 VIEW COMPARISON:  03/09/2022, CT 01/20/2022, radiograph 01/18/2022 FINDINGS: Right-sided central venous  catheter tips over the right atrium. Left upper extremity vascular stent. Worsening airspace disease at the right greater than left lung base. Cardiomegaly with vascular congestion. No pneumothorax. IMPRESSION: 1. Cardiomegaly with vascular congestion and pulmonary edema 2. Worsening airspace disease at the right greater than left lung base, atelectasis versus pneumonia Electronically Signed   By: Donavan Foil M.D.   On: 03/09/2022 19:24   DG Chest 2 View  Result Date: 03/09/2022 CLINICAL DATA:  Cough EXAM: CHEST - 2 VIEW COMPARISON:  Chest x-ray dated September 18, 2021 FINDINGS: Unchanged position of right-sided central venous line. Cardiac and mediastinal contours are within normal limits. Mild bilateral interstitial opacities. Small bilateral pleural effusions. No evidence of pneumothorax. Partially visualized percutaneous cholecystostomy tube. IMPRESSION: 1. Mild pulmonary edema. 2. Small bilateral pleural effusions. Electronically Signed   By: Yetta Glassman M.D.   On: 03/09/2022 15:49     Medications:    anticoagulant sodium citrate     azithromycin Stopped (03/11/22 0039)   cefTRIAXone (ROCEPHIN)  IV Stopped (03/10/22 2338)   heparin 1,200 Units/hr (03/11/22 0603)    aspirin EC  81 mg Oral Daily   calcium acetate  1,334 mg Oral TID WC   Chlorhexidine Gluconate Cloth  6 each Topical Q0600   insulin aspart  0-5 Units Subcutaneous QHS   insulin aspart  0-6 Units Subcutaneous TID WC   insulin detemir  10 Units Subcutaneous QHS   levothyroxine  137 mcg Oral Q0600   multivitamin  1 tablet Oral Daily   pantoprazole  40 mg Oral Daily   simvastatin  20 mg Oral q1800   sodium chloride flush  5 mL Intracatheter Daily   acetaminophen **OR** acetaminophen, albuterol, alteplase, anticoagulant sodium citrate, heparin, lidocaine (PF), lidocaine-prilocaine, nitroGLYCERIN, ondansetron **OR** ondansetron (ZOFRAN) IV, pentafluoroprop-tetrafluoroeth, senna-docusate  Chest x-ray done on March 09, 2022 IMPRESSION: 1. Cardiomegaly with vascular congestion and pulmonary edema 2. Worsening airspace disease at the right greater than left lung base, atelectasis versus pneumonia       Assessment/ Plan:  [name] is a 76 y.o.  male with a past medical history of hypertension, end-stage renal disease, on hemodialysis on Monday Wednesday Friday schedule, diabetes mellitus, CVA who was brought to the ER with chief complaint of weakness.  [facility]/TTS/right chest PermCath  End-stage renal disease on hemodialysis.         Patient was last dialyzed yesterday-Tuesday.      No need for renal placement therapy today    2. Anemia of chronic kidney disease  Lab Results  Component Value Date   HGB 8.1 (L) 03/11/2022    Hemoglobin is not within desired target. Will follow   3. Secondary Hyperparathyroidism: with outpatient labs: PTH 422, phosphorus 6.4, calcium 9.1 on 12/29/2021.   Lab Results  Component Value Date   CALCIUM 8.1 (L) 03/10/2022   CAION 0.83 (LL) 12/10/2020   PHOS 4.3 01/30/2022    Calcium and phosphorus within acceptable range  4.  Hypertension with chronic kidney disease.  Home regimen includes hydralazine and losartan.  Currently receiving these medications along with metoprolol.   5. Diabetes mellitus type II with chronic kidney disease insulin dependent. Home regimen includes NovoLog and Levemir. Most recent hemoglobin A1c is 8.1 on 01/18/2022.  Currently prescribed Januvia outpatient.  Currently held.  Sliding scale  insulin managed per primary team.  6.  Acute cholecystitis seen on CT.  Surgery consulted.  IR placed percutaneous drain on 01/21/22 and stabilizing with medications.  Plan for percutaneous drain for 6 weeks.  Completed antibiotic regimen of Flagyl and Avycaz.    7.  Hyponatremia Patient has low sodium secondary to ESRD/inability to get rid of free water  8.  Non ST elevation MI Patient is on heparin drip Patient is hemodynamically  stable  9. Acute systolic CHF Patient 2D echo done on October 17 had shown patient EF was at around 40-45%   10. Community-acquired pneumonia Patient is on azithromycin and ceftriaxone Patient is clinically improving   Plan  No need for dialysis today        LOS: 2 Joseph Mcpherson s Joseph Mcpherson 10/18/20237:56 AM

## 2022-03-11 NOTE — ED Notes (Signed)
Dr singh at bedside

## 2022-03-11 NOTE — Assessment & Plan Note (Signed)
-   PT/OT evaluation after completion of heparin.

## 2022-03-12 DIAGNOSIS — I214 Non-ST elevation (NSTEMI) myocardial infarction: Secondary | ICD-10-CM | POA: Diagnosis not present

## 2022-03-12 LAB — RENAL FUNCTION PANEL
Albumin: 2.6 g/dL — ABNORMAL LOW (ref 3.5–5.0)
Anion gap: 7 (ref 5–15)
BUN: 29 mg/dL — ABNORMAL HIGH (ref 8–23)
CO2: 25 mmol/L (ref 22–32)
Calcium: 8.5 mg/dL — ABNORMAL LOW (ref 8.9–10.3)
Chloride: 101 mmol/L (ref 98–111)
Creatinine, Ser: 8.16 mg/dL — ABNORMAL HIGH (ref 0.61–1.24)
GFR, Estimated: 6 mL/min — ABNORMAL LOW (ref >60)
Glucose, Bld: 187 mg/dL — ABNORMAL HIGH (ref 70–99)
Phosphorus: 4.1 mg/dL (ref 2.5–4.6)
Potassium: 4.3 mmol/L (ref 3.5–5.1)
Sodium: 133 mmol/L — ABNORMAL LOW (ref 135–145)

## 2022-03-12 LAB — CBC
HCT: 25.3 % — ABNORMAL LOW (ref 39.0–52.0)
Hemoglobin: 7.7 g/dL — ABNORMAL LOW (ref 13.0–17.0)
MCH: 26.6 pg (ref 26.0–34.0)
MCHC: 30.4 g/dL (ref 30.0–36.0)
MCV: 87.2 fL (ref 80.0–100.0)
Platelets: 257 10*3/uL (ref 150–400)
RBC: 2.9 MIL/uL — ABNORMAL LOW (ref 4.22–5.81)
RDW: 17.9 % — ABNORMAL HIGH (ref 11.5–15.5)
WBC: 9.4 10*3/uL (ref 4.0–10.5)
nRBC: 0 % (ref 0.0–0.2)

## 2022-03-12 LAB — GLUCOSE, CAPILLARY
Glucose-Capillary: 164 mg/dL — ABNORMAL HIGH (ref 70–99)
Glucose-Capillary: 97 mg/dL (ref 70–99)
Glucose-Capillary: 154 mg/dL — ABNORMAL HIGH (ref 70–99)
Glucose-Capillary: 173 mg/dL — ABNORMAL HIGH (ref 70–99)

## 2022-03-12 LAB — PROCALCITONIN: Procalcitonin: 4.72 ng/mL

## 2022-03-12 LAB — PREPARE RBC (CROSSMATCH)

## 2022-03-12 MED ORDER — SODIUM CHLORIDE 0.9% IV SOLUTION: Freq: Once | INTRAVENOUS | Status: AC

## 2022-03-12 MED ORDER — CHLORPROMAZINE HCL 25 MG PO TABS
25.0000 mg | ORAL_TABLET | Freq: Three times a day (TID) | ORAL | Status: DC
  Administered 2022-03-12 – 2022-03-13 (×3): 25 mg via ORAL
  Filled 2022-03-12 (×4): qty 1

## 2022-03-12 NOTE — Assessment & Plan Note (Addendum)
Troponin peaked above 2000, now trending down.  Echocardiogram with regional wall motion abnormalities.  Cardiology was consulted and they are recommending medical management.  Completed 48 hours of IV heparin -Cardiology added beta-blocker and Plavix -Continue with as needed nitroglycerin. -Continue with aspirin and Lipitor -Outpatient ischemic work-up in 1 to 2 weeks per cardiology

## 2022-03-12 NOTE — Assessment & Plan Note (Signed)
Hemoglobin decreased to 7.7 today. -Ordered 1 unit of PRBC due to recent ACS

## 2022-03-12 NOTE — Assessment & Plan Note (Signed)
-   PT/OT evaluation 

## 2022-03-12 NOTE — Progress Notes (Signed)
[facility]  ROUNDING NOTE   Subjective:   Patient is a 76 year old man of Asian/Indian origin with a past medical history of ESRD, on hemodialysis on Tuesday Thursday Saturday schedule, diabetes mellitus type 2, CVA, hypothyroidism who came to the ER with chief complaint of shortness of breath  History of present illness date back to this morning when patient had shortness of breath and diaphoresis. Patient's spouse called EMS.  Patient was evaluated by EMS but then patient felt better and decided not to bring patient in the ER.  Patient went for his regular appointment and then had another episode of shortness of breath and diaphoresis.  Patient was brought to the ER Upon evaluation in the ER patient was found to be tachypneic, tachycardic, blood pressure was high and patient was desaturating with a pulse ox of around 82%.  Patient was started on BiPAP.  Nephrology was consulted for comanagement of dialysis patient. Patient goes to [facility] and undergoes dialysis under care of Dr. Graylon Gunning  Patient was seen today on dialysis unit Patient tolerating treatment well  Objective:  Vital signs in last 24 hours:  Temp:  [97.4 F (36.3 C)-99.3 F (37.4 C)] 97.9 F (36.6 C) (10/19 0738) Pulse Rate:  [62-113] 74 (10/19 0738) Resp:  [14-22] 18 (10/19 0738) BP: (95-161)/(40-76) 157/74 (10/19 0738) SpO2:  [96 %-100 %] 99 % (10/19 0738) Weight:  [67 kg] 67 kg (10/18 1625)  Weight change:  Filed Weights   03/09/22 1858 03/11/22 1625  Weight: 69.4 kg 67 kg    Intake/Output: I/O last 3 completed shifts: In: 6063 [I.V.:473; IV Piggyback:700] Out: 551 [Drains:250; Other:300; Stool:1]   Intake/Output this shift:  No intake/output data recorded.  Physical Exam: General: NAD  Head: Normocephalic, atraumatic. Moist oral mucosal membranes  Eyes: Anicteric  Lungs:  No respiratory distress, Decreased breath sounds at bases  Heart: Regular rate and rhythm  Abdomen:   Soft, non-tender, nondistended, JP drain in situ  Extremities: No peripheral edema.  Neurologic: Nonfocal, moving all four extremities  Skin: No lesions  Access: Right chest PermCath    Basic Metabolic Panel: Recent Labs  Lab 03/09/22 1901 03/10/22 0400 03/11/22 0505 03/12/22 0537  NA 133* 136 139 133*  K 4.9 4.0 4.2 4.3  CL 95* 100 104 101  CO2 '22 27 24 25  '$ GLUCOSE 186* 76 102* 187*  BUN 21 26* 19 29*  CREATININE 7.28* 7.81* 7.04* 8.16*  CALCIUM 8.8* 8.1* 8.8* 8.5*  PHOS  --   --  3.3 4.1    Liver Function Tests: Recent Labs  Lab 03/09/22 1901 03/11/22 0505 03/12/22 0537  AST 36  --   --   ALT 18  --   --   ALKPHOS 120  --   --   BILITOT 1.3*  --   --   PROT 7.5  --   --   ALBUMIN 3.1* 2.6* 2.6*   No results for input(s): "LIPASE", "AMYLASE" in the last 168 hours. No results for input(s): "AMMONIA" in the last 168 hours.  CBC: Recent Labs  Lab 03/09/22 2044 03/10/22 0400 03/11/22 0505 03/12/22 0537  WBC 25.7* 16.1* 11.5* 9.4  NEUTROABS 24.1*  --   --   --   HGB 8.8* 7.6* 8.1* 7.7*  HCT 28.2* 24.8* 27.0* 25.3*  MCV 86.8 87.3 87.9 87.2  PLT 311 263 284 257    Cardiac Enzymes: No results for input(s): "CKTOTAL", "CKMB", "CKMBINDEX", "TROPONINI" in the last 168 hours.  BNP:  Invalid input(s): "POCBNP"  CBG: Recent Labs  Lab 03/11/22 0906 03/11/22 1200 03/11/22 1722 03/11/22 2021 03/12/22 0735  GLUCAP 88 107* 129* 147* 164*    Microbiology: Results for orders placed or performed during the hospital encounter of 01/18/22  Blood Culture (routine x 2)     Status: None   Collection Time: 01/18/22 11:51 AM   Specimen: BLOOD  Result Value Ref Range Status   Specimen Description BLOOD LEFT AC  Final   Special Requests   Final    BOTTLES DRAWN AEROBIC AND ANAEROBIC Blood Culture results may not be optimal due to an excessive volume of blood received in culture bottles   Culture   Final    NO GROWTH 5 DAYS Performed at [name], [address] [place], [place] [address]    Report Status 01/23/2022 FINAL  Final  Resp Panel by RT-PCR (Flu A&B, Covid) Anterior Nasal Swab     Status: None   Collection Time: 01/18/22 11:51 AM   Specimen: Anterior Nasal Swab  Result Value Ref Range Status   SARS Coronavirus 2 by RT PCR NEGATIVE NEGATIVE Final    Comment: (NOTE) SARS-CoV-2 target nucleic acids are NOT DETECTED.  The SARS-CoV-2 RNA is generally detectable in upper respiratory specimens during the acute phase of infection. The lowest concentration of SARS-CoV-2 viral copies this assay can detect is 138 copies/mL. A negative result does not preclude SARS-Cov-2 infection and should not be used as the sole basis for treatment or other patient management decisions. A negative result may occur with  improper specimen collection/handling, submission of specimen other than nasopharyngeal swab, presence of viral mutation(s) within the areas targeted by this assay, and inadequate number of viral copies(<138 copies/mL). A negative result must be combined with clinical observations, patient history, and epidemiological information. The expected result is Negative.  Fact Sheet for Patients:  EntrepreneurPulse.com.au  Fact Sheet for Healthcare Providers:  IncredibleEmployment.be  This test is no t yet approved or cleared by the [place] FDA and  has been authorized for detection and/or diagnosis of SARS-CoV-2 by FDA under an Emergency Use Authorization (EUA). This EUA will remain  in effect (meaning this test can be used) for the duration of the COVID-19 declaration under Section 564(b)(1) of the Act, 21 U.S.C.section 360bbb-3(b)(1), unless the authorization is terminated  or revoked sooner.       Influenza A by PCR NEGATIVE NEGATIVE Final   Influenza B by PCR NEGATIVE NEGATIVE Final    Comment: (NOTE) The Xpert Xpress SARS-CoV-2/FLU/RSV plus assay is intended as an aid in the  diagnosis of influenza from Nasopharyngeal swab specimens and should not be used as a sole basis for treatment. Nasal washings and aspirates are unacceptable for Xpert Xpress SARS-CoV-2/FLU/RSV testing.  Fact Sheet for Patients: EntrepreneurPulse.com.au  Fact Sheet for Healthcare Providers: IncredibleEmployment.be  This test is not yet approved or cleared by the [place] FDA and has been authorized for detection and/or diagnosis of SARS-CoV-2 by FDA under an Emergency Use Authorization (EUA). This EUA will remain in effect (meaning this test can be used) for the duration of the COVID-19 declaration under Section 564(b)(1) of the Act, 21 U.S.C. section 360bbb-3(b)(1), unless the authorization is terminated or revoked.  Performed at [facility], [address] [place], [place] [address]   Blood Culture (routine x 2)     Status: None   Collection Time: 01/18/22  2:30 PM   Specimen: BLOOD  Result Value Ref Range Status  Specimen Description BLOOD RIGHT ARM  Final   Special Requests   Final    BOTTLES DRAWN AEROBIC AND ANAEROBIC Blood Culture adequate volume   Culture   Final    NO GROWTH 5 DAYS Performed at [facility], [address] [place], [place] [address]    Report Status 01/27/2022 FINAL  Final  Aerobic/Anaerobic Culture w Gram Stain (surgical/deep wound)     Status: None   Collection Time: 01/21/22  3:30 PM   Specimen: Abscess  Result Value Ref Range Status   Specimen Description   Final    ABSCESS GALL BLADDER Performed at [facility], [address] [place], [place] [address]    Special Requests   Final    NONE Performed at [facility], [address] [place], [place] [address]    Gram Stain   Final    FEW WBC PRESENT,BOTH PMN AND MONONUCLEAR RARE GRAM POSITIVE COCCI IN PAIRS    Culture   Final    FEW ESCHERICHIA COLI MULTI-DRUG RESISTANT ORGANISM CRITICAL RESULT CALLED  TO, READ BACK BY AND VERIFIED WITH: PHARMD K. Hino [id number] FCP MODERATE STREPTOCOCCUS GALLOLYTICUS NO ANAEROBES ISOLATED SEE SEPARATE REPORT Performed at [facility], [place] [address], [place], [place] [address]    Report Status 02/19/2022 FINAL  Final   Organism ID, Bacteria ESCHERICHIA COLI  Final   Organism ID, Bacteria STREPTOCOCCUS GALLOLYTICUS  Final      Susceptibility   Escherichia coli - MIC*    AMPICILLIN >=32 RESISTANT Resistant     CEFAZOLIN >=64 RESISTANT Resistant     CEFEPIME 4 INTERMEDIATE Intermediate     CEFTAZIDIME >=64 RESISTANT Resistant     CEFTRIAXONE >=64 RESISTANT Resistant     CIPROFLOXACIN >=4 RESISTANT Resistant     GENTAMICIN <=1 SENSITIVE Sensitive     IMIPENEM RESISTANT Resistant     TRIMETH/SULFA <=20 SENSITIVE Sensitive     AMPICILLIN/SULBACTAM >=32 RESISTANT Resistant     PIP/TAZO 64 INTERMEDIATE Intermediate     * FEW ESCHERICHIA COLI   Streptococcus gallolyticus - MIC*    PENICILLIN 0.12 SENSITIVE Sensitive     CEFTRIAXONE <=0.12 SENSITIVE Sensitive     ERYTHROMYCIN >=8 RESISTANT Resistant     LEVOFLOXACIN >=16 RESISTANT Resistant     VANCOMYCIN 0.25 SENSITIVE Sensitive     * MODERATE STREPTOCOCCUS GALLOLYTICUS  Carbapenem Resistance Panel     Status: None   Collection Time: 01/21/22  3:30 PM  Result Value Ref Range Status   Carba Resistance IMP Gene NOT DETECTED NOT DETECTED Final   Carba Resistance VIM Gene NOT DETECTED NOT DETECTED Final   Carba Resistance NDM Gene NOT DETECTED NOT DETECTED Final   Carba Resistance KPC Gene NOT DETECTED NOT DETECTED Final   Carba Resistance OXA48 Gene NOT DETECTED NOT DETECTED Final    Comment: (NOTE) Cepheid Carba-R is an FDA-cleared nucleic acid amplification test  (NAAT)for the detection and differentiation of genes encoding the  most prevalent carbapenemases in bacterial isolate samples. Carbapenemase gene identification and implementation of comprehensive  infection control measures are  recommended by the CDC to prevent the  spread of the resistant organisms. Performed at [facility], [place] [address] [place], [place] [address]   Susceptibility, Aer + Anaerob     Status: Abnormal   Collection Time: 01/21/22  3:30 PM  Result Value Ref Range Status   Suscept, Aer + Anaerob Final report (A)  Corrected    Comment: (NOTE) Performed At: BN  [facility] [address] [place], [place] [id number] Rush Farmer MD WU:[id number] CORRECTED ON 09/13 AT 0636: PREVIOUSLY REPORTED AS Preliminary report    Source of Sample   Final    [id number] ECOLI SUSCEPTIBILITY TESTING [facility] ABSCESS GALL BLADDER    Comment: Performed at [facility], [place] [address], [place], [place] [address]  Susceptibility Result     Status: Abnormal   Collection Time: 01/21/22  3:30 PM  Result Value Ref Range Status   Suscept Result 1 Escherichia coli (A)  Final    Comment: (NOTE) Identification performed by account, not confirmed by this laboratory. Multi-Drug Resistant Organism Susceptibility profile is consistent with a probable ESBL.    Antimicrobial Suscept Comment  Corrected    Comment: (NOTE)      ** S = Susceptible; I = Intermediate; R = Resistant **                   P = Positive; N = Negative            MICS are expressed in micrograms per mL   Antibiotic                 RSLT#1    RSLT#2    RSLT#3    RSLT#4 Amoxicillin/Clavulanic Acid    R Ampicillin                     R Cefazolin                      R Cefepime                       I Ceftriaxone                    R Cefuroxime                     R Ciprofloxacin                  R Ertapenem                      S Gentamicin                     S Imipenem                       S Levofloxacin                   R Meropenem                      S Nitrofurantoin                 S Piperacillin/Tazobactam        R Tetracycline                   S Tobramycin                     S Trimethoprim/Sulfa             S Performed  At: [facility] [employer] [place], [place] [id number] Rush Farmer MD WU:[id number]   Min Inhibitory Conc (2 Drugs)     Status: None   Collection Time: 01/21/22  3:30 PM  Result Value Ref Range Status   Min Inhibitory Conc (  2 Drugs) Final report  Final    Comment: (NOTE) Performed At: [facility] [place], [place] [id number] Rush Farmer MD LN:[id number]    Source ([place]) Gall Bladder Aspirate  Final    Comment: Performed at [facility], [place] [address], [place], [place] [address]  MIC Results (2 Drugs)     Status: None   Collection Time: 01/21/22  3:30 PM  Result Value Ref Range Status   RESULT 5 MIC (RESULT 1)                Base Name: Escherichia coli  Final    Comment: (NOTE) Identification performed by account, not confirmed by this laboratory. AVYCAZ            <=8 UG/ML = SUSCEPTIBLE TIGECYCLINE       <=2 UG/ML = SUSCEPTIBLE Performed At: [facility] [place], [place] [id number] Rush Farmer MD HF:[id number]     Coagulation Studies: Recent Labs    03/09/22 2002  LABPROT 13.6  INR 1.1     Urinalysis: No results for input(s): "COLORURINE", "LABSPEC", "PHURINE", "GLUCOSEU", "HGBUR", "BILIRUBINUR", "KETONESUR", "PROTEINUR", "UROBILINOGEN", "NITRITE", "LEUKOCYTESUR" in the last 72 hours.  Invalid input(s): "APPERANCEUR"    Imaging: ECHOCARDIOGRAM COMPLETE  Result Date: 03/10/2022    ECHOCARDIOGRAM REPORT   Patient Name:   Joseph Mcpherson Date of Exam: 03/10/2022 Medical Rec #:  [id number]   Height:       64.0 in Accession #:    [id number]  Weight:       153.0 lb Date of Birth:  10/07/1945    BSA:          1.746 m Patient Age:    42 years    BP:           113/49 mmHg Patient Gender: M           HR:           68 bpm. Exam Location:  [facility] Procedure: 2D Echo, Cardiac Doppler and Color Doppler Indications:     R06.00 Dyspnea  History:         Patient has prior history of Echocardiogram examinations, most                   recent 05/05/2019. Patient on dialysis; Risk                  Factors:Hypertension, Dyslipidemia and Diabetes.  Sonographer:     Rosalia Hammers Referring Phys:  [id number] AMY N COX Diagnosing Phys: Serafina Royals MD  Sonographer Comments: Image acquisition challenging due to respiratory motion. IMPRESSIONS  1. Left ventricular ejection fraction, by estimation, is 40 to 45%. The left ventricle has mildly decreased function. The left ventricle demonstrates regional wall motion abnormalities (see scoring diagram/findings for description). Left ventricular diastolic parameters were normal.  2. Right ventricular systolic function is normal. The right ventricular size is normal.  3. Left atrial size was mildly dilated.  4. The mitral valve is normal in structure. Moderate mitral valve regurgitation.  5. The aortic valve is normal in structure. Aortic valve regurgitation is trivial. FINDINGS  Left Ventricle: Left ventricular ejection fraction, by estimation, is 40 to 45%. The left ventricle has mildly decreased function. The left ventricle demonstrates regional wall motion abnormalities. Moderate hypokinesis of the left ventricular, mid-apical inferoseptal wall, apical segment and anteroseptal wall. The left ventricular internal cavity size was normal in size. There is no left ventricular hypertrophy. Left ventricular diastolic  parameters were normal. Right Ventricle: The right ventricular size is normal. No increase in right ventricular wall thickness. Right ventricular systolic function is normal. Left Atrium: Left atrial size was mildly dilated. Right Atrium: Right atrial size was normal in size. Pericardium: There is no evidence of pericardial effusion. Mitral Valve: The mitral valve is normal in structure. Moderate mitral valve regurgitation. Tricuspid Valve: The tricuspid valve is normal in structure. Tricuspid valve regurgitation is mild. Aortic Valve: The aortic valve is normal in structure. Aortic valve  regurgitation is trivial. Aortic valve mean gradient measures 3.0 mmHg. Aortic valve peak gradient measures 6.1 mmHg. Aortic valve area, by VTI measures 1.73 cm. Pulmonic Valve: The pulmonic valve was normal in structure. Pulmonic valve regurgitation is trivial. Aorta: The aortic root and ascending aorta are structurally normal, with no evidence of dilitation. IAS/Shunts: No atrial level shunt detected by color flow Doppler.  LEFT VENTRICLE PLAX 2D LVIDd:         4.60 cm   Diastology LVIDs:         3.00 cm   LV e' medial:    5.44 cm/s LV PW:         1.30 cm   LV E/e' medial:  21.3 LV IVS:        1.20 cm   LV e' lateral:   8.27 cm/s LVOT diam:     1.80 cm   LV E/e' lateral: 14.0 LV SV:         46 LV SV Index:   27 LVOT Area:     2.54 cm  RIGHT VENTRICLE RV Basal diam:  2.80 cm RV Mid diam:    2.60 cm RV S prime:     14.10 cm/s TAPSE (M-mode): 1.2 cm LEFT ATRIUM             Index        RIGHT ATRIUM           Index LA diam:        3.90 cm 2.23 cm/m   RA Area:     15.20 cm LA Vol (A2C):   50.1 ml 28.70 ml/m  RA Volume:   39.70 ml  22.74 ml/m LA Vol (A4C):   46.3 ml 26.52 ml/m LA Biplane Vol: 51.3 ml 29.38 ml/m  AORTIC VALVE                    PULMONIC VALVE AV Area (Vmax):    1.65 cm     PR End Diast Vel: 2.77 msec AV Area (Vmean):   1.69 cm AV Area (VTI):     1.73 cm AV Vmax:           123.00 cm/s AV Vmean:          81.600 cm/s AV VTI:            0.267 m AV Peak Grad:      6.1 mmHg AV Mean Grad:      3.0 mmHg LVOT Vmax:         79.60 cm/s LVOT Vmean:        54.200 cm/s LVOT VTI:          0.182 m LVOT/AV VTI ratio: 0.68  AORTA Ao Root diam: 2.80 cm MITRAL VALVE                TRICUSPID VALVE MV Area (PHT): 3.74 cm     TR Peak grad:   18.8 mmHg MV Decel Time: 203 msec  TR Vmax:        217.00 cm/s MV E velocity: 116.00 cm/s MV A velocity: 82.10 cm/s   SHUNTS MV E/A ratio:  1.41         Systemic VTI:  0.18 m                             Systemic Diam: 1.80 cm Serafina Royals MD Electronically signed by Serafina Royals MD Signature Date/Time: 03/10/2022/5:02:52 PM    Final      Medications:    anticoagulant sodium citrate     azithromycin 500 mg (03/11/22 2317)   cefTRIAXone (ROCEPHIN)  IV 2 g (03/11/22 2228)    aspirin EC  81 mg Oral Daily   atorvastatin  40 mg Oral Daily   calcium acetate  1,334 mg Oral TID WC   Chlorhexidine Gluconate Cloth  6 each Topical Q0600   clopidogrel  75 mg Oral Daily   influenza vaccine adjuvanted  0.5 mL Intramuscular Tomorrow-1000   insulin aspart  0-5 Units Subcutaneous QHS   insulin aspart  0-6 Units Subcutaneous TID WC   insulin detemir  10 Units Subcutaneous QHS   levothyroxine  137 mcg Oral Q0600   metoprolol succinate  25 mg Oral Daily   multivitamin  1 tablet Oral Daily   pantoprazole  40 mg Oral Daily   sodium chloride flush  5 mL Intracatheter Daily   acetaminophen **OR** acetaminophen, albuterol, alteplase, alum & mag hydroxide-simeth, anticoagulant sodium citrate, calcium carbonate, heparin, hydrALAZINE, lidocaine (PF), lidocaine-prilocaine, nitroGLYCERIN, ondansetron **OR** ondansetron (ZOFRAN) IV, pentafluoroprop-tetrafluoroeth, senna-docusate  Chest x-ray done on March 09, 2022 IMPRESSION: 1. Cardiomegaly with vascular congestion and pulmonary edema 2. Worsening airspace disease at the right greater than left lung base, atelectasis versus pneumonia       Assessment/ Plan:  Joseph Mcpherson is a 76 y.o.  male with a past medical history of hypertension, end-stage renal disease, on hemodialysis on Monday Wednesday Friday schedule, diabetes mellitus, CVA who was brought to the ER with chief complaint of weakness.  [facility]/TTS/right chest PermCath  End-stage renal disease on hemodialysis.         Patient is on Tuesday Thursday Saturday schedule as an outpatient       Patient will be dialyzed today    2. Anemia of chronic kidney disease  Lab Results  Component Value Date   HGB 7.7 (L) 03/12/2022    Hemoglobin is not  within desired target. Will follow   3. Secondary Hyperparathyroidism: with outpatient labs: PTH 422, phosphorus 6.4, calcium 9.1 on 12/29/2021.   Lab Results  Component Value Date   CALCIUM 8.5 (L) 03/12/2022   CAION 0.83 (LL) 12/10/2020   PHOS 4.1 03/12/2022    Calcium and phosphorus within acceptable range  4.  Hypertension with chronic kidney disease.  Home regimen includes hydralazine and losartan.  Currently receiving these medications along with metoprolol.   5. Diabetes mellitus type II with chronic kidney disease insulin dependent. Home regimen includes NovoLog and Levemir. Most recent hemoglobin A1c is 8.1 on 01/18/2022.  Currently prescribed Januvia outpatient.  Currently held.  Sliding scale insulin managed per primary team.  6.  Acute cholecystitis seen on CT.  Surgery consulted.  IR placed percutaneous drain on 01/21/22 and stabilizing with medications.  Plan for percutaneous drain for 6 weeks.  Completed antibiotic regimen of Flagyl and Avycaz.    7.  Hyponatremia Patient has low sodium secondary  to ESRD/inability to get rid of free water  8.  Non ST elevation MI Patient is on heparin drip Patient is hemodynamically stable  9. Acute systolic CHF Patient 2D echo done on October 17 had shown patient EF was at around 40-45%   10. Community-acquired pneumonia Patient is on azithromycin and ceftriaxone Patient is clinically improving   Plan  Patient will be dialyzed today   Addendum Patient was seen in the dialysis unit Patient tolerating treatment well     LOS: 3 Joseph Mcpherson s Joseph Mcpherson 10/19/20237:53 AM

## 2022-03-12 NOTE — Care Management Important Message (Signed)
Important Message  Patient Details  Name: Joseph Mcpherson MRN: [id number] Date of Birth: [birth year]   Medicare Important Message Given:  N/A - LOS <3 / Initial given by admissions     Dannette Barbara 03/12/2022, 6:33 PM

## 2022-03-12 NOTE — Progress Notes (Addendum)
Patient was seen evaluated and examined by me and the PA on 03/12/22.  Course of action, evaluation, and management decisions were developed solely by me, but detailed below in the PA's note. [place] NOTE       Patient ID: Joseph Mcpherson MRN: [id number] DOB/AGE: 76-Dec-1947 68 y.o.  Admit date: 03/09/2022 Referring Physician Dr. Lorella Nimrod Primary Physician Dr. 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  Chest x-ray shows a right lower lobe consolidation with elevated procalcitonin and leukocytosis to 27,000 on admission concerning for CAP for which empiric antibiotics have been started.  Troponin on admission was elevated to 982 and has trended 1240, 1805, with 2062 with echocardiogram showing reduced EF 40-45% previously 60-65% 04/2019) with moderate hypokinesis of the LV mid apical inferoseptal wall, apical segment and anteroseptal segment concerning for NSTEMI.  Cardiology was formally consulted on hospital day 2 for further assistance.  History is primarily obtained from the patient's wife at bedside and the patient's daughter is present by phone.  She says that about at 11 AM on 10/16 the patient was sitting on the sofa and felt a little short of breath after walking between rooms in the home, she called EMS and they gave him a nebulizer treatment and he felt better.  He had a appointment at 130 afternoon with his general surgeon following up his cholecystostomy drain that was placed 01/21/2022 and he was walking fine and feeling good.  Around 5:30 PM the patient felt very short of breath with a productive cough and was very  diaphoretic, so EMS was called again.  During that episode he denied any chest discomfort,, was primarily very short of breath.  Interval history: -Weaned to room air yesterday afternoon -Seen in room after dialysis today, feels okay, somewhat bothered by his intermittent hiccups which is his only complaint, denies shortness of breath or chest pain.  Review of systems complete and found to be negative unless listed above     Past Medical History:  Diagnosis Date   Clotting disorder ([place])    per daughter, "when patient travels, his feet swell"   Diabetes mellitus without complication ([place])    type II   Diabetic eye exam ([place]) 12/2019   ESRD (end stage renal disease) ([place])    MWF- [place]   GERD (gastroesophageal reflux disease)    [name] (hard of hearing)    Hyperlipidemia    Hypertension    Hypothyroidism    Thyroid disease     Past Surgical History:  Procedure Laterality Date   AV FISTULA PLACEMENT Right 10/22/2017   Procedure: ARTERIOVENOUS (AV) BRACHIOCEPHALIC FISTULA CREATION RIGHT UPPER ARM;  Surgeon: Conrad Gu [place], MD;  Location: [place];  Service: Vascular;  Laterality: Right;   AV FISTULA PLACEMENT Left 02/03/2019   Procedure: INSERTION OF ARTERIOVENOUS (AV) GORE-TEX GRAFT ARM (BRACHIAL AXILLARY );  Surgeon: [name], MD;  Location: [facility];  Service: Vascular;  Laterality: Left;   AV FISTULA PLACEMENT Left 12/10/2020   Procedure: LEFT ARM BRACHIOCEPHALIC ARTERIOVENOUS (AV) FISTULA CREATION;  Surgeon: [name], MD;  Location: [place];  Service: Vascular;  Laterality: Left;   IR PERC CHOLECYSTOSTOMY  01/21/2022  UPPER EXTREMITY ANGIOGRAPHY Left 09/19/2019   Procedure: UPPER EXTREMITY ANGIOGRAPHY;  Surgeon: [name], MD;  Location: [place] [facility];  Service: Cardiovascular;  Laterality: Left;    Medications Prior to Admission  Medication Sig Dispense Refill Last Dose   amLODipine (NORVASC) 10 MG tablet Take 1 tablet (10 mg total) by mouth  daily. 30 tablet 3 03/09/2022 at 0800   aspirin EC 81 MG tablet Take 81 mg by mouth daily. Swallow whole.      B Complex-C-Folic Acid (RENA-VITE RX) 1 MG TABS Take 1 tablet by mouth daily.       BD PEN NEEDLE NANO 2ND GEN 32G X 4 MM MISC USE AS DIRECTED 300 each 1    calcium acetate (PHOSLO) 667 MG capsule TAKE 2 CAPSULES BY MOUTH 3 TIMES A DAY WITH MEAL 540 capsule 1 03/09/2022 at 0800   chlorproMAZINE (THORAZINE) 25 MG tablet TAKE 1 TABLET BY MOUTH THREE TIMES A DAY 540 tablet 1    Cholecalciferol (VITAMIN D3) 5000 units TABS Take 5,000 Units by mouth daily.       Continuous Blood Gluc Sensor (FREESTYLE LIBRE 14 DAY SENSOR) MISC APPLY EVERY 14 (FOURTEEN) DAYS. 90 each 1    donepezil (ARICEPT) 10 MG tablet TAKE 1 TABLET BY MOUTH EVERY DAY 90 tablet 3 Past Week at Unknown   gabapentin (NEURONTIN) 300 MG capsule TAKE 1 CAPSULE BY MOUTH EVERY DAY AT BEDTIME AS DIRECTED 90 capsule 3 Past Week at Unknown   hydrALAZINE (APRESOLINE) 50 MG tablet Take 1 tablet (50 mg total) by mouth 3 (three) times daily. 90 tablet 6 03/09/2022 at 0800   insulin detemir (LEVEMIR) 100 UNIT/ML injection Inject 0.15 mLs (15 Units total) into the skin at bedtime. 10 mL 11 Past Week at Unknown   ipratropium-albuterol (DUONEB) 0.5-2.5 (3) MG/3ML SOLN Take 3 mLs by nebulization every 6 (six) hours as needed. 120 mL 4 Unknown at PRN   levothyroxine (SYNTHROID) 137 MCG tablet TAKE 1 TABLET BY MOUTH EVERY DAY 90 tablet 3 03/09/2022 at 0700   losartan (COZAAR) 100 MG tablet Take 1 tablet (100 mg total) by mouth every evening. 30 tablet 3 03/09/2022 at 0800   metoprolol succinate (TOPROL-XL) 50 MG 24 hr tablet TAKE 1 TABLET BY MOUTH EVERY DAY 90 tablet 3 03/09/2022 at 0800   Multiple Vitamins-Minerals (MULTIVITAMIN WITH MINERALS) tablet Take 1 tablet by mouth daily.      pantoprazole (PROTONIX) 40 MG tablet TAKE 1 TABLET BY MOUTH EVERY DAY 90 tablet 3 03/09/2022 at 0800   simvastatin (ZOCOR) 20 MG tablet TAKE 1 TABLET BY MOUTH EVERY  DAY IN THE EVENING 90 tablet 3 Past Week at Unknown   sitaGLIPtin (JANUVIA) 50 MG tablet Take 50 mg by mouth every evening.    Past Week at Unknown   sodium chloride flush (NS) 0.9 % SOLN Inject 5 mLs into the vein daily. Flush cholecystostomy tube daily with 5 ccs of NS 300 mL 0    docusate sodium (COLACE) 100 MG capsule TAKE 1 CAPSULE BY MOUTH TWICE A DAY (Patient not taking: Reported on 03/10/2022) 60 capsule 1 Not Taking   levocetirizine (XYZAL) 5 MG tablet TAKE 1 TABLET BY MOUTH EVERY DAY IN THE EVENING (Patient not taking: Reported on 03/10/2022) 90 tablet 1 Not Taking   sevelamer carbonate (RENVELA) 2.4 g PACK Take 2.4 g by mouth daily. (Patient not taking: Reported on 03/10/2022) 90 each 3 Not Taking    Social History   Socioeconomic History   Marital  status: Married    Spouse name: Not on file   Number of children: Not on file   Years of education: 12   Highest education level: 12th grade  Occupational History   Occupation: Retired  Tobacco Use   Smoking status: Never   Smokeless tobacco: Current    Types: Snuff  Vaping Use   Vaping Use: Never used  Substance and Sexual Activity   Alcohol use: No   Drug use: No   Sexual activity: Not Currently  Other Topics Concern   Not on file  Social History Narrative   Not on file   Social Determinants of Health   Financial Resource Strain: Low Risk  (06/19/2021)   Overall Financial Resource Strain (CARDIA)    Difficulty of Paying Living Expenses: Not hard at all  Food Insecurity: No Food Insecurity (03/11/2022)   Hunger Vital Sign    Worried About Running Out of Food in the Last Year: Never true    [place] in the Last Year: Never true  Transportation Needs: No Transportation Needs (03/11/2022)   PRAPARE - Hydrologist (Medical): No    Lack of Transportation (Non-Medical): No  Physical Activity: Sufficiently Active (06/19/2021)   Exercise Vital Sign    Days of Exercise per Week: 7 days     Minutes of Exercise per Session: 30 min  Stress: No Stress Concern Present (06/19/2021)   [place]    Feeling of Stress : Not at all  Social Connections: [place] (06/19/2021)   Social Connection and Isolation Panel [NHANES]    Frequency of Communication with Friends and Family: More than three times a week    Frequency of Social Gatherings with Friends and Family: More than three times a week    Attends Religious Services: More than 4 times per year    Active Member of Clubs or Organizations: Yes    Attends Archivist Meetings: More than 4 times per year    Marital Status: Married  Human resources officer Violence: Not At Risk (03/11/2022)   Humiliation, Afraid, Rape, and Kick questionnaire    Fear of Current or Ex-Partner: No    Emotionally Abused: No    Physically Abused: No    Sexually Abused: No    Family History  Problem Relation Age of Onset   Diabetes Mother    Hyperlipidemia Mother    Hypertension Father      Vitals:   03/12/22 0900 03/12/22 0930 03/12/22 1000 03/12/22 1030  BP: (!) 156/73 (!) 144/75 (!) 152/73 (!) 148/62  Pulse: 69 71 76 75  Resp: '16 18 18 16  '$ Temp:      TempSrc:      SpO2: 100% 100% 100% 100%  Weight:      Height:        PHYSICAL EXAM General: Pleasant ill-appearing Asian male, in no acute distress.  Sitting upright in hospital bed without family at bedside.   HEENT:  Normocephalic and atraumatic.  Somewhat hard of hearing Neck:  No JVD.  Chest: Right chest permacath placed Lungs: Normal respiratory effort on room air.   Heart: HRRR . Normal S1 and S2 without gallops or murmurs.  Abdomen: Soft, non-distended appearing slightly tender to palpation in the RUQ cholecystostomy tube present.  Msk: Normal strength and tone for age. Extremities: Warm and well perfused. No clubbing, cyanosis.  No peripheral edema.  Neuro: Alert and oriented X 3. Psych:  Answers  questions appropriately.   Labs: Basic Metabolic Panel: Recent Labs    03/11/22 0505 03/12/22 0537  NA 139 133*  K 4.2 4.3  CL 104 101  CO2 24 25  GLUCOSE 102* 187*  BUN 19 29*  CREATININE 7.04* 8.16*  CALCIUM 8.8* 8.5*  PHOS 3.3 4.1    Liver Function Tests: Recent Labs    03/09/22 1901 03/11/22 0505 03/12/22 0537  AST 36  --   --   ALT 18  --   --   ALKPHOS 120  --   --   BILITOT 1.3*  --   --   PROT 7.5  --   --   ALBUMIN 3.1* 2.6* 2.6*    No results for input(s): "LIPASE", "AMYLASE" in the last 72 hours. CBC: Recent Labs    03/09/22 2044 03/10/22 0400 03/11/22 0505 03/12/22 0537  WBC 25.7*   < > 11.5* 9.4  NEUTROABS 24.1*  --   --   --   HGB 8.8*   < > 8.1* 7.7*  HCT 28.2*   < > 27.0* 25.3*  MCV 86.8   < > 87.9 87.2  PLT 311   < > 284 257   < > = values in this interval not displayed.    Cardiac Enzymes: Recent Labs    03/10/22 0058 03/10/22 0400 03/11/22 0505  TROPONINIHS 1,805* 2,062* 980*    BNP: Recent Labs    03/09/22 1901  BNP 2,687.5*    D-Dimer: No results for input(s): "DDIMER" in the last 72 hours. Hemoglobin A1C: No results for input(s): "HGBA1C" in the last 72 hours. Fasting Lipid Panel: No results for input(s): "CHOL", "HDL", "LDLCALC", "TRIG", "CHOLHDL", "LDLDIRECT" in the last 72 hours. Thyroid Function Tests: No results for input(s): "TSH", "T4TOTAL", "T3FREE", "THYROIDAB" in the last 72 hours.  Invalid input(s): "FREET3" Anemia Panel: No results for input(s): "VITAMINB12", "FOLATE", "FERRITIN", "TIBC", "IRON", "RETICCTPCT" in the last 72 hours.   Radiology: ECHOCARDIOGRAM COMPLETE  Result Date: 03/10/2022    ECHOCARDIOGRAM REPORT   Patient Name:   AYLEN RAMBERT Date of Exam: 03/10/2022 Medical Rec #:  [id number]   Height:       64.0 in Accession #:    [id number]  Weight:       153.0 lb Date of Birth:  1945/08/13    BSA:          1.746 m Patient Age:    23 years    BP:           113/49 mmHg Patient Gender: M            HR:           68 bpm. Exam Location:  [facility] Procedure: 2D Echo, Cardiac Doppler and Color Doppler Indications:     R06.00 Dyspnea  History:         Patient has prior history of Echocardiogram examinations, most                  recent 05/05/2019. Patient on dialysis; Risk                  Factors:Hypertension, Dyslipidemia and Diabetes.  Sonographer:     Rosalia Hammers Referring Phys:  [id number] AMY N [facility] Diagnosing Phys: Serafina Royals MD  Sonographer Comments: Image acquisition challenging due to respiratory motion. IMPRESSIONS  1. Left ventricular ejection fraction, by estimation, is 40 to 45%. The left ventricle has mildly decreased function. The left ventricle demonstrates regional wall motion  abnormalities (see scoring diagram/findings for description). Left ventricular diastolic parameters were normal.  2. Right ventricular systolic function is normal. The right ventricular size is normal.  3. Left atrial size was mildly dilated.  4. The mitral valve is normal in structure. Moderate mitral valve regurgitation.  5. The aortic valve is normal in structure. Aortic valve regurgitation is trivial. FINDINGS  Left Ventricle: Left ventricular ejection fraction, by estimation, is 40 to 45%. The left ventricle has mildly decreased function. The left ventricle demonstrates regional wall motion abnormalities. Moderate hypokinesis of the left ventricular, mid-apical inferoseptal wall, apical segment and anteroseptal wall. The left ventricular internal cavity size was normal in size. There is no left ventricular hypertrophy. Left ventricular diastolic parameters were normal. Right Ventricle: The right ventricular size is normal. No increase in right ventricular wall thickness. Right ventricular systolic function is normal. Left Atrium: Left atrial size was mildly dilated. Right Atrium: Right atrial size was normal in size. Pericardium: There is no evidence of pericardial effusion. Mitral Valve: The mitral valve is normal in  structure. Moderate mitral valve regurgitation. Tricuspid Valve: The tricuspid valve is normal in structure. Tricuspid valve regurgitation is mild. Aortic Valve: The aortic valve is normal in structure. Aortic valve regurgitation is trivial. Aortic valve mean gradient measures 3.0 mmHg. Aortic valve peak gradient measures 6.1 mmHg. Aortic valve area, by VTI measures 1.73 cm. Pulmonic Valve: The pulmonic valve was normal in structure. Pulmonic valve regurgitation is trivial. Aorta: The aortic root and ascending aorta are structurally normal, with no evidence of dilitation. IAS/Shunts: No atrial level shunt detected by color flow Doppler.  LEFT VENTRICLE PLAX 2D LVIDd:         4.60 cm   Diastology LVIDs:         3.00 cm   LV e' medial:    5.44 cm/s LV PW:         1.30 cm   LV E/e' medial:  21.3 LV IVS:        1.20 cm   LV e' lateral:   8.27 cm/s LVOT diam:     1.80 cm   LV E/e' lateral: 14.0 LV SV:         46 LV SV Index:   27 LVOT Area:     2.54 cm  RIGHT VENTRICLE RV Basal diam:  2.80 cm RV Mid diam:    2.60 cm RV S prime:     14.10 cm/s TAPSE (M-mode): 1.2 cm LEFT ATRIUM             Index        RIGHT ATRIUM           Index LA diam:        3.90 cm 2.23 cm/m   RA Area:     15.20 cm LA Vol (A2C):   50.1 ml 28.70 ml/m  RA Volume:   39.70 ml  22.74 ml/m LA Vol (A4C):   46.3 ml 26.52 ml/m LA Biplane Vol: 51.3 ml 29.38 ml/m  AORTIC VALVE                    PULMONIC VALVE AV Area (Vmax):    1.65 cm     PR End Diast Vel: 2.77 msec AV Area (Vmean):   1.69 cm AV Area (VTI):     1.73 cm AV Vmax:           123.00 cm/s AV Vmean:          81.600  cm/s AV VTI:            0.267 m AV Peak Grad:      6.1 mmHg AV Mean Grad:      3.0 mmHg LVOT Vmax:         79.60 cm/s LVOT Vmean:        54.200 cm/s LVOT VTI:          0.182 m LVOT/AV VTI ratio: 0.68  AORTA Ao Root diam: 2.80 cm MITRAL VALVE                TRICUSPID VALVE MV Area (PHT): 3.74 cm     TR Peak grad:   18.8 mmHg MV Decel Time: 203 msec     TR Vmax:        217.00  cm/s MV E velocity: 116.00 cm/s MV A velocity: 82.10 cm/s   SHUNTS MV E/A ratio:  1.41         Systemic VTI:  0.18 m                             Systemic Diam: 1.80 cm Serafina Royals MD Electronically signed by Serafina Royals MD Signature Date/Time: 03/10/2022/5:02:52 PM    Final    DG Chest Portable 1 View  Result Date: 03/09/2022 CLINICAL DATA:  Shortness of breath EXAM: PORTABLE CHEST 1 VIEW COMPARISON:  03/09/2022, CT 01/20/2022, radiograph 01/18/2022 FINDINGS: Right-sided central venous catheter tips over the right atrium. Left upper extremity vascular stent. Worsening airspace disease at the right greater than left lung base. Cardiomegaly with vascular congestion. No pneumothorax. IMPRESSION: 1. Cardiomegaly with vascular congestion and pulmonary edema 2. Worsening airspace disease at the right greater than left lung base, atelectasis versus pneumonia Electronically Signed   By: Donavan Foil M.D.   On: 03/09/2022 19:24   DG Chest 2 View  Result Date: 03/09/2022 CLINICAL DATA:  Cough EXAM: CHEST - 2 VIEW COMPARISON:  Chest x-ray dated September 18, 2021 FINDINGS: Unchanged position of right-sided central venous line. Cardiac and mediastinal contours are within normal limits. Mild bilateral interstitial opacities. Small bilateral pleural effusions. No evidence of pneumothorax. Partially visualized percutaneous cholecystostomy tube. IMPRESSION: 1. Mild pulmonary edema. 2. Small bilateral pleural effusions. Electronically Signed   By: Yetta Glassman M.D.   On: 03/09/2022 15:49    ECHO  EF 40-45% previously 60-65% 04/2019) with moderate hypokinesis of the LV mid apical inferoseptal wall, apical segment and anteroseptal segment   TELEMETRY reviewed by me (LT) 03/12/2022 : Sinus rhythm rate 70s  EKG reviewed by me: Initially sinus tachycardia rate 114 with baseline wander and slight diffuse ST depressions, repeat 10/18 shows sinus rhythm LVH with rate 74, lateral T wave inversions and in V4.  Data  reviewed by me (LT) 03/12/2022: Discharge summary from hospitalization in August 2023, last outpatient PCP note, ED note, admission H&P, hospitalist progress note, CBC, BMP, troponins, ordered repeat troponin, BNP, chest x-ray, prior echo  Principal Problem:   NSTEMI (non-ST elevated myocardial infarction) ([place]) Active Problems:   Essential hypertension   Mixed hyperlipidemia   Insulin dependent type 2 diabetes mellitus ([place])   Hypothyroidism   End stage renal disease ([place])   Anemia in chronic kidney disease   History of CVA (cerebrovascular accident)   Generalized weakness   Acute clinical systolic heart failure (HCC)   Volume overload   CAP (community acquired pneumonia)    ASSESSMENT AND PLAN:  Joseph Mcpherson is a [phone]  with a PMH of ESRD (T,TH,S HD), 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  Chest x-ray shows a right lower lobe consolidation with elevated procalcitonin and leukocytosis to 27,000 on admission concerning for CAP for which empiric antibiotics have been started.  Troponin on admission was elevated to 982 and has trended 1240, 1805, with 2062 with echocardiogram showing reduced EF 40-45% previously 60-65% 04/2019) with moderate hypokinesis of the LV mid apical inferoseptal wall, apical segment and anteroseptal concerning for NSTEMI.  Cardiology was formally consulted on hospital day 2 for further assistance.  #NSTEMI Presented with shortness of breath and diaphoresis, requiring BiPAP in the ED and emergent dialysis on day of admission 10/16.  Troponin elevated to 982 and has up trended to 2062 with repeats pending, echo showing a newly reduced EF to 40 to 45% with multiple WMA's (previously 60-65% in 2020).  EKG showed sinus tachycardia with significant baseline wander  but some inferior ST depressions, with repeat EKG pending today -Trend troponin until peak, then stop. -S/p 325 mg aspirin, continue 81 mg aspirin daily -Continue clopidogrel 75 mg once daily, to continue with aspirin for at least 6 months -S/p heparin GTT to complete 48 hours (end 10/18 at 2000) -Switch simvastatin to atorvastatin 40 mg daily -We will add low-dose metoprolol XL 12.5 mg as his blood pressure allows -Discussed the risk, benefits, and alternatives for further ischemic evaluation versus conservative management during this admission with the patient, his wife at bedside and daughter (by phone).  In the setting of his pneumonia with an oxygen requirement, ongoing abdominal pain with his recent cholecystostomy tube placement, and worsening anemia from baseline (Hgb 8.1 today), they strongly prefer continued conservative management at this time and aggressive risk factor modification and outpatient follow-up with cardiology, which is certainly reasonable. -Follow-up with Dr. Clayborn Bigness 1 to 2 weeks after discharge.  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   -He received emergent dialysis on Monday 10/16 and again on 10/17 for volume removal, he makes some urine at baseline, so we will dose IV Lasix 40 mg x 1 today -Continue GDMT with metoprolol XL 12.5 mg, will escalate GDMT per clinical course as his blood pressure allows.  #Acute hypoxemic respiratory failure #Community-acquired pneumonia Leukocytosis to 25,700 on admission, chest x-ray with evidence of right lower lobe consolidation, required BiPAP on presentation and urgent hemodialysis on Monday, requiring 3 L by nasal cannula on hospital day 2. He is on empiric azithromycin and ceftriaxone per primary team.  #ESRD on T, TH, S HD Has been on hemodialysis for the past 3-1/2 years  per his wife, denies missed treatments. -Management per nephrology  #Recent acute cholecystitis s/p cholecystostomy tube placed 8/30 Was treated conservatively with antibiotics and cholecystostomy drain, has a follow-up with general surgery on 10/23 for more imaging and definitive decision regarding surgical management.  This patient's plan of care was discussed and created with Dr. Clayborn Bigness and he is in agreement.  Signed: Tristan Schroeder , PA-C 03/12/2022, 10:47 AM [facility] Cardiology

## 2022-03-12 NOTE — Progress Notes (Signed)
Progress Note   Patient: Joseph Mcpherson UVO:[id number] DOB: 06/27/1945 DOA: 03/09/2022     3 DOS: the patient was seen and examined on 03/12/2022   Brief hospital course: [name] is a 76 year old male with history of end-stage renal disease on hemodialysis Tuesday Thursday Saturday, hypertension, insulin-dependent diabetes mellitus, neuropathy, GERD, hypothyroid, hard of hearing, who presents to the emergency department for chief concerns of dyspnea, and diaphoresis.  Initial vitals in the emergency department showed temperature of 98.1, respiration rate of 23, heart rate of 113, blood pressure 161/106, SPO2 of 82% on room air and patient was placed on BiPAP with improvement to 99% on room air.  Serum sodium is 133, potassium 4.9, chloride 95, bicarb 22, BUN of 21, serum creatinine of 7.28, nonfasting blood glucose 182, EGFR is 7, WBC 13.7, hemoglobin 9.7, platelets of 242.  Lactic acid is 1.8.  High sensitive troponin was elevated at 982.  BNP elevated 2687.5.  ED treatment: Heparin GGT, furosemide 40 mg IV one-time dose, nitroglycerin 0.5 inch.  EDP consulted nephrology and cardiology.  10/18: Troponin peaked at 2062.  Cardiology is recommending medical management. Patient remain on heparin infusion for the past 2 days, will complete 48 hours later today. Echocardiogram with EF of 40 to 45% and regional wall motion abnormalities along with moderate mitral regurgitation.  10/19: Based on his recent underlying comorbidities with pneumonia and cholecystitis s/p cholecystostomy drain in place cardiology is recommending outpatient ischemic work-up and follow-up with his cardiologist in 1 to 2 weeks after discharge.  They also added beta-blocker along with DAPT with aspirin and Plavix.  Hemoglobin decreased to 7.7 today, ordered 1 unit of PRBC to keep the hemoglobin above 8 due to recent ACS. Procalcitonin with slight improvement to 4.72.  Upper respiratory symptoms improving. Patient had  his dialysis today.  Patient is high risk for decompensation and mortality based on age and underlying comorbidities with recent NSTEMI and concern of pneumonia. Palliative care consult.   Assessment and Plan: * NSTEMI (non-ST elevated myocardial infarction) ([place]) Troponin peaked above 2000, now trending down.  Echocardiogram with regional wall motion abnormalities.  Cardiology was consulted and they are recommending medical management.  Completed 48 hours of IV heparin -Cardiology added beta-blocker and Plavix -Continue with as needed nitroglycerin. -Continue with aspirin and Lipitor -Outpatient ischemic work-up in 1 to 2 weeks per cardiology   Volume overload Volume is being managed with dialysis. Routine dialysis on TTS Nephrology is on board.  End stage renal disease ([place]) - On dialysis [facility] -Continue with scheduled dialysis  Generalized weakness - PT/OT evaluation   Essential hypertension Blood pressure mildly elevated. -Continue metoprolol -Holding home amlodipine and hydralazine as blood pressure is becoming soft at time -Continue to monitor -As needed hydralazine  Insulin dependent type 2 diabetes mellitus (HCC) - Insulin SSI with at bedtime coverage ordered  Anemia in chronic kidney disease Hemoglobin decreased to 7.7 today. -Ordered 1 unit of PRBC due to recent ACS  CAP (community acquired pneumonia) - Possible right lower lobe with elevated procalcitonin.  Improving upper respiratory symptoms, leukocytosis resolved and procalcitonin started trending down - Azithromycin, ceftriaxone -Continue with supportive care   Hypothyroidism - Patient takes levothyroxine 137 mcg daily  Mixed hyperlipidemia - Simvastatin was switched with atorvastatin  Acute clinical systolic heart failure (HCC) Echocardiogram with EF of 40 to 45% and regional wall motion abnormalities. - Strict I's and O's   History of CVA (cerebrovascular accident) - Resume aspirin 81 mg  daily and  statin   Subjective: Patient was seen and examined today he was feeling much improved.  Cough improving.  Had his dialysis today.  Agreed to proceed with blood transfusion.  No chest pain.  No right upper quadrant pain.  Physical Exam: Vitals:   03/12/22 1120 03/12/22 1134 03/12/22 1135 03/12/22 1209  BP: (!) 150/67 107/84  (!) 157/69  Pulse: 75 81  78  Resp: 18 18  16   Temp: 98.1 F (36.7 C) 98.1 F (36.7 C)  98.2 F (36.8 C)  TempSrc: Oral Oral    SpO2: 100% 100%  97%  Weight:   66.1 kg   Height:       General.  Frail elderly man, in no acute distress. Pulmonary.  Lungs clear bilaterally, normal respiratory effort. CV.  Regular rate and rhythm, no JVD, rub or murmur. Abdomen.  Soft, nontender, nondistended, BS positive. CNS.  Alert and oriented .  No focal neurologic deficit. Extremities.  No edema, no cyanosis, pulses intact and symmetrical. Psychiatry.  Judgment and insight appears normal.   Data Reviewed: Prior data reviewed  Family Communication: Discussed with wife at bedside and daughter on phone.  Disposition: Status is: Inpatient Remains inpatient appropriate because: Severity of illness.   Planned Discharge Destination: Home with Home Health  DVT prophylaxis.  Heparin infusion Time spent: 50 minutes  This record has been created using Systems analyst. Errors have been sought and corrected,but may not always be located. Such creation errors do not reflect on the standard of care.  Author: Lorella Nimrod, MD 03/12/2022 4:11 PM  For on call review [email or web address].

## 2022-03-12 NOTE — Evaluation (Signed)
Physical Therapy Evaluation Patient Details Name: Joseph Mcpherson MRN: [id number] DOB: 28-Jun-1945 Today's Date: 03/12/2022  History of Present Illness  76 year old male with history of end-stage renal disease on hemodialysis Tuesday Thursday Saturday, hypertension, insulin-dependent diabetes mellitus, neuropathy, GERD, hypothyroid, hard of hearing, who presents to the emergency department for chief concerns of dyspnea, and diaphoresis.  Clinical Impression  Pt out of room for dialysis this AM, seen for PT eval in PM.  He reports that is he not too fatigued for HD (typically is not) and was eager to be able to get up and do some real ambulation.  He reports he has been up in the recliner and been able to go to the bathroom with staff on occasion, but that he is not used to being stuck in bed most of the day and is very eager to get back home.  He was able to do all bed mobiltiy, transfers w/o assist and confidently ambulated ~125 ft into the hallway w/o AD, w/o DOE and w/o safety concerns.  He assures me that even though he may be a little weaker than baseline that he does not want PT (re: HHPT "I usually shoo them away because I stay active on my own and don't need them.")  Pt will have assist from family on d/c, but showed appropriate and safe mobility to return home when medically ready for d/c.       Recommendations for follow up therapy are one component of a multi-disciplinary discharge planning process, led by the attending physician.  Recommendations may be updated based on patient status, additional functional criteria and insurance authorization.  Follow Up Recommendations No PT follow up      Assistance Recommended at Discharge Set up Supervision/Assistance  Patient can return home with the following  Assist for transportation    Equipment Recommendations None recommended by PT  Recommendations for Other Services       Functional Status Assessment Patient has not had a recent decline  in their functional status     Precautions / Restrictions Precautions Precautions: Fall Restrictions Weight Bearing Restrictions: No      Mobility  Bed Mobility Overal bed mobility: Modified Independent             General bed mobility comments: able to get to sitting and back to supine w/o direct assist    Transfers Overall transfer level: Modified independent Equipment used: None               General transfer comment: Pt able to rise with confidence from standard height bed, no assist or AD needed    Ambulation/Gait Ambulation/Gait assistance: Supervision Gait Distance (Feet): 100 Feet Assistive device: None         General Gait Details: Pt intially reaching gentle to surfaces to initially get steady, quickly shows he does not need this and was able to ambulate confidently in the hall w/o issue - no fatigue or DOE  Stairs            Wheelchair Mobility    Modified Rankin (Stroke Patients Only)       Balance Overall balance assessment: Modified Independent                                           Pertinent Vitals/Pain Pain Assessment Pain Assessment: No/denies pain    Home Living Family/patient expects to be discharged  to:: Private residence Living Arrangements: Spouse/significant other Available Help at Discharge: Family;Available 24 hours/day Type of Home: House Home Access: Stairs to enter Entrance Stairs-Rails: Can reach both Entrance Stairs-Number of Steps: 3   Home Layout: Two level;Able to live on main level with bedroom/bathroom Home Equipment: Grab bars - toilet;Grab bars - tub/shower      Prior Function Prior Level of Function : Independent/Modified Independent             Mobility Comments: Pt reports he is up ad lib, can regularly get out of the house (in addition to HD) ADLs Comments: states he can manage all ADLs w/o assist     Hand Dominance        Extremity/Trunk Assessment   Upper  Extremity Assessment Upper Extremity Assessment: Generalized weakness    Lower Extremity Assessment Lower Extremity Assessment: Generalized weakness       Communication   Communication: No difficulties  Cognition Arousal/Alertness: Awake/alert Behavior During Therapy: WFL for tasks assessed/performed Overall Cognitive Status: Within Functional Limits for tasks assessed                                          General Comments      Exercises     Assessment/Plan    PT Assessment Patient does not need any further PT services  PT Problem List         PT Treatment Interventions      PT Goals (Current goals can be found in the Care Plan section)  Acute Rehab PT Goals Patient Stated Goal: go home ASAP PT Goal Formulation: All assessment and education complete, DC therapy    Frequency       Co-evaluation               AM-PAC PT "6 Clicks" Mobility  Outcome Measure Help needed turning from your back to your side while in a flat bed without using bedrails?: None Help needed moving from lying on your back to sitting on the side of a flat bed without using bedrails?: None Help needed moving to and from a bed to a chair (including a wheelchair)?: None Help needed standing up from a chair using your arms (e.g., wheelchair or bedside chair)?: None Help needed to walk in hospital room?: None Help needed climbing 3-5 steps with a railing? : None 6 Click Score: 24    End of Session   Activity Tolerance: Patient tolerated treatment well Patient left: with bed alarm set;with call bell/phone within reach;with family/visitor present Nurse Communication: Mobility status (encouraged regular mobility) PT Visit Diagnosis: Muscle weakness (generalized) (M62.81)    Time: 2706-2376 PT Time Calculation (min) (ACUTE ONLY): 15 min   Charges:   PT Evaluation $PT Eval Low Complexity: 1 Low          Kreg Shropshire, DPT 03/12/2022, 6:25 PM

## 2022-03-12 NOTE — Assessment & Plan Note (Signed)
-   Possible right lower lobe with elevated procalcitonin.  Improving upper respiratory symptoms, leukocytosis resolved and procalcitonin started trending down - Azithromycin, ceftriaxone -Continue with supportive care

## 2022-03-12 NOTE — Progress Notes (Signed)
   03/12/22 1120  Vitals  Temp 98.1 F (36.7 C)  Temp Source Oral  BP (!) 150/67  MAP (mmHg) 87  BP Location Left Arm  BP Method Automatic  Patient Position (if appropriate) Lying  Pulse Rate 75  Pulse Rate Source Monitor  ECG Heart Rate 75  Resp 18  Oxygen Therapy  SpO2 100 %  O2 Device Room Air  Patient Activity (if Appropriate) In bed  Pulse Oximetry Type Continuous  During Treatment Monitoring  Blood Flow Rate (mL/min) 200 mL/min  HD Safety Checks Performed Yes  Intra-Hemodialysis Comments Tx completed  Post Treatment  Dialyzer Clearance Lightly streaked  Duration of HD Treatment -hour(s) 3 hour(s)  Hemodialysis Intake (mL) 0 mL  Liters Processed 64  Fluid Removed 2000 mL  Tolerated HD Treatment Yes  Post-Hemodialysis Comments hd completed. no complications.  Hemodialysis Catheter Right Subclavian Double lumen Permanent (Tunneled)  No placement date or time found.   Placed prior to admission: Yes  Orientation: Right  Access Location: Subclavian  Hemodialysis Catheter Type: Double lumen Permanent (Tunneled)  Discharged from Facility with Line Intact?: Yes  Site Condition No complications  Blue Lumen Status Heparin locked  Red Lumen Status Heparin locked  Purple Lumen Status N/A  Catheter fill solution Heparin 1000 units/ml  Catheter fill volume (Arterial) 1.9 cc  Catheter fill volume (Venous) 1.9  Dressing Type Transparent  Dressing Status Antimicrobial disc in place  Interventions Dressing changed;New dressing  Drainage Description None  Dressing Change Due 03/19/22  Post treatment catheter status Capped and Clamped

## 2022-03-13 DIAGNOSIS — I214 Non-ST elevation (NSTEMI) myocardial infarction: Secondary | ICD-10-CM | POA: Diagnosis not present

## 2022-03-13 LAB — GLUCOSE, CAPILLARY
Glucose-Capillary: 87 mg/dL (ref 70–99)
Glucose-Capillary: 142 mg/dL — ABNORMAL HIGH (ref 70–99)

## 2022-03-13 LAB — BPAM RBC
Blood Product Expiration Date: 202310312359
ISSUE DATE / TIME: 202310191636
Unit Type and Rh: 7300

## 2022-03-13 LAB — TYPE AND SCREEN
ABO/RH(D): B POS
Antibody Screen: NEGATIVE
Unit division: 0

## 2022-03-13 LAB — PROCALCITONIN: Procalcitonin: 3.63 ng/mL

## 2022-03-13 LAB — HEMOGLOBIN AND HEMATOCRIT, BLOOD
HCT: 30.3 % — ABNORMAL LOW (ref 39.0–52.0)
Hemoglobin: 9.2 g/dL — ABNORMAL LOW (ref 13.0–17.0)

## 2022-03-13 MED ORDER — LEVOFLOXACIN 500 MG PO TABS: 500.0000 mg | ORAL_TABLET | ORAL | Status: DC

## 2022-03-13 MED ORDER — LEVOFLOXACIN 500 MG PO TABS: 500.0000 mg | ORAL_TABLET | ORAL | 0 refills | Status: AC

## 2022-03-13 MED ORDER — CLOPIDOGREL BISULFATE 75 MG PO TABS: 75.0000 mg | ORAL_TABLET | Freq: Every day | ORAL | 1 refills | Status: AC

## 2022-03-13 MED ORDER — ATORVASTATIN CALCIUM 40 MG PO TABS: 40.0000 mg | ORAL_TABLET | Freq: Every day | ORAL | 1 refills | Status: DC

## 2022-03-13 MED ORDER — ALBUTEROL SULFATE (2.5 MG/3ML) 0.083% IN NEBU: 2.5000 mg | INHALATION_SOLUTION | Freq: Four times a day (QID) | RESPIRATORY_TRACT | 1 refills | Status: DC | PRN

## 2022-03-13 NOTE — Progress Notes (Addendum)
[facility]  ROUNDING NOTE   Subjective:   Patient is a 76 year old man of Asian/Indian origin with a past medical history of ESRD, on hemodialysis on Tuesday Thursday Saturday schedule, diabetes mellitus type 2, CVA, hypothyroidism who came to the ER with chief complaint of shortness of breath  Patient resting quietly in bed Wife at bedside Tolerating small meals, denies nausea and vomiting Remains on room air  Objective:  Vital signs in last 24 hours:  Temp:  [97.9 F (36.6 C)-99.4 F (37.4 C)] 97.9 F (36.6 C) (10/20 1149) Pulse Rate:  [67-78] 67 (10/20 1149) Resp:  [16-20] 18 (10/20 1149) BP: (87-168)/(31-74) 168/74 (10/20 1149) SpO2:  [98 %-100 %] 99 % (10/20 1149)  Weight change: 1.1 kg Filed Weights   03/11/22 1625 03/12/22 0800 03/12/22 1135  Weight: 67 kg 68.1 kg 66.1 kg    Intake/Output: I/O last 3 completed shifts: In: 2353 [P.O.:240; Blood:384; IV Piggyback:700] Out: 6144 [Drains:450; Other:2000; Stool:1]   Intake/Output this shift:  Total I/O In: 480 [P.O.:480] Out: -   Physical Exam: General: NAD  Head: Normocephalic, atraumatic. Moist oral mucosal membranes  Eyes: Anicteric  Lungs:  No respiratory distress, Decreased breath sounds at bases  Heart: Regular rate and rhythm  Abdomen:  Soft, non-tender, nondistended, JP drain in situ  Extremities: No peripheral edema.  Neurologic: Nonfocal, moving all four extremities  Skin: No lesions  Access: Right chest PermCath    Basic Metabolic Panel: Recent Labs  Lab 03/09/22 1901 03/10/22 0400 03/11/22 0505 03/12/22 0537  NA 133* 136 139 133*  K 4.9 4.0 4.2 4.3  CL 95* 100 104 101  CO2 '22 27 24 25  '$ GLUCOSE 186* 76 102* 187*  BUN 21 26* 19 29*  CREATININE 7.28* 7.81* 7.04* 8.16*  CALCIUM 8.8* 8.1* 8.8* 8.5*  PHOS  --   --  3.3 4.1     Liver Function Tests: Recent Labs  Lab 03/09/22 1901 03/11/22 0505 03/12/22 0537  AST 36  --   --   ALT 18  --   --   ALKPHOS 120  --   --    BILITOT 1.3*  --   --   PROT 7.5  --   --   ALBUMIN 3.1* 2.6* 2.6*    No results for input(s): "LIPASE", "AMYLASE" in the last 168 hours. No results for input(s): "AMMONIA" in the last 168 hours.  CBC: Recent Labs  Lab 03/09/22 2044 03/10/22 0400 03/11/22 0505 03/12/22 0537 03/13/22 0551  WBC 25.7* 16.1* 11.5* 9.4  --   NEUTROABS 24.1*  --   --   --   --   HGB 8.8* 7.6* 8.1* 7.7* 9.2*  HCT 28.2* 24.8* 27.0* 25.3* 30.3*  MCV 86.8 87.3 87.9 87.2  --   PLT 311 263 284 257  --      Cardiac Enzymes: No results for input(s): "CKTOTAL", "CKMB", "CKMBINDEX", "TROPONINI" in the last 168 hours.  BNP: Invalid input(s): "POCBNP"  CBG: Recent Labs  Lab 03/12/22 1206 03/12/22 1616 03/12/22 2112 03/13/22 0736 03/13/22 1146  GLUCAP 97 154* 173* 63 142*     Microbiology: Results for orders placed or performed during the hospital encounter of 01/18/22  Blood Culture (routine x 2)     Status: None   Collection Time: 01/18/22 11:51 AM   Specimen: BLOOD  Result Value Ref Range Status   Specimen Description BLOOD LEFT [facility]  Final   Special Requests   Final    BOTTLES DRAWN AEROBIC AND  ANAEROBIC Blood Culture results may not be optimal due to an excessive volume of blood received in culture bottles   Culture   Final    NO GROWTH 5 DAYS Performed at [facility], [address] [place], [place] [address]    Report Status 01/23/2022 FINAL  Final  Resp Panel by RT-PCR (Flu A&B, Covid) Anterior Nasal Swab     Status: None   Collection Time: 01/18/22 11:51 AM   Specimen: Anterior Nasal Swab  Result Value Ref Range Status   SARS Coronavirus 2 by RT PCR NEGATIVE NEGATIVE Final    Comment: (NOTE) SARS-CoV-2 target nucleic acids are NOT DETECTED.  The SARS-CoV-2 RNA is generally detectable in upper respiratory specimens during the acute phase of infection. The lowest concentration of SARS-CoV-2 viral copies this assay can detect is 138 copies/mL. A negative result does  not preclude SARS-Cov-2 infection and should not be used as the sole basis for treatment or other patient management decisions. A negative result may occur with  improper specimen collection/handling, submission of specimen other than nasopharyngeal swab, presence of viral mutation(s) within the areas targeted by this assay, and inadequate number of viral copies(<138 copies/mL). A negative result must be combined with clinical observations, patient history, and epidemiological information. The expected result is Negative.  Fact Sheet for Patients:  EntrepreneurPulse.com.au  Fact Sheet for Healthcare Providers:  IncredibleEmployment.be  This test is no t yet approved or cleared by the [place] FDA and  has been authorized for detection and/or diagnosis of SARS-CoV-2 by FDA under an Emergency Use Authorization (EUA). This EUA will remain  in effect (meaning this test can be used) for the duration of the COVID-19 declaration under Section 564(b)(1) of the Act, 21 U.S.C.section 360bbb-3(b)(1), unless the authorization is terminated  or revoked sooner.       Influenza A by PCR NEGATIVE NEGATIVE Final   Influenza B by PCR NEGATIVE NEGATIVE Final    Comment: (NOTE) The Xpert Xpress SARS-CoV-2/FLU/RSV plus assay is intended as an aid in the diagnosis of influenza from Nasopharyngeal swab specimens and should not be used as a sole basis for treatment. Nasal washings and aspirates are unacceptable for Xpert Xpress SARS-CoV-2/FLU/RSV testing.  Fact Sheet for Patients: EntrepreneurPulse.com.au  Fact Sheet for Healthcare Providers: IncredibleEmployment.be  This test is not yet approved or cleared by the [place] FDA and has been authorized for detection and/or diagnosis of SARS-CoV-2 by FDA under an Emergency Use Authorization (EUA). This EUA will remain in effect (meaning this test can be used) for the  duration of the COVID-19 declaration under Section 564(b)(1) of the Act, 21 U.S.C. section 360bbb-3(b)(1), unless the authorization is terminated or revoked.  Performed at [facility], [address] [place], [place] [address]   Blood Culture (routine x 2)     Status: None   Collection Time: 01/18/22  2:30 PM   Specimen: BLOOD  Result Value Ref Range Status   Specimen Description BLOOD RIGHT ARM  Final   Special Requests   Final    BOTTLES DRAWN AEROBIC AND ANAEROBIC Blood Culture adequate volume   Culture   Final    NO GROWTH 5 DAYS Performed at [facility], [address], [place],  [address]    Report Status 01/27/2022 FINAL  Final  Aerobic/Anaerobic Culture w Gram Stain (surgical/deep wound)     Status: None   Collection Time: 01/21/22  3:30 PM   Specimen: Abscess  Result Value Ref Range Status  Specimen Description   Final    ABSCESS GALL BLADDER Performed at [facility], [address] [place], [place] [address]    Special Requests   Final    NONE Performed at [facility] [address] [place], [place] [address]    Gram Stain   Final    FEW WBC PRESENT,BOTH PMN AND MONONUCLEAR RARE GRAM POSITIVE COCCI IN PAIRS    Culture   Final    FEW ESCHERICHIA COLI MULTI-DRUG RESISTANT ORGANISM CRITICAL RESULT CALLED TO, READ BACK BY AND VERIFIED WITH: PHARMD K. Ariel [id number] FCP MODERATE STREPTOCOCCUS GALLOLYTICUS NO ANAEROBES ISOLATED SEE SEPARATE REPORT Performed at [facility], [place] [address], [place], [place] [address]    Report Status 02/19/2022 FINAL  Final   Organism ID, Bacteria ESCHERICHIA COLI  Final   Organism ID, Bacteria STREPTOCOCCUS GALLOLYTICUS  Final      Susceptibility   Escherichia coli - MIC*    AMPICILLIN >=32 RESISTANT Resistant     CEFAZOLIN >=64 RESISTANT Resistant     CEFEPIME 4 INTERMEDIATE Intermediate     CEFTAZIDIME >=64 RESISTANT Resistant     CEFTRIAXONE >=64 RESISTANT  Resistant     CIPROFLOXACIN >=4 RESISTANT Resistant     GENTAMICIN <=1 SENSITIVE Sensitive     IMIPENEM RESISTANT Resistant     TRIMETH/SULFA <=20 SENSITIVE Sensitive     AMPICILLIN/SULBACTAM >=32 RESISTANT Resistant     PIP/TAZO 64 INTERMEDIATE Intermediate     * FEW ESCHERICHIA COLI   Streptococcus gallolyticus - MIC*    PENICILLIN 0.12 SENSITIVE Sensitive     CEFTRIAXONE <=0.12 SENSITIVE Sensitive     ERYTHROMYCIN >=8 RESISTANT Resistant     LEVOFLOXACIN >=16 RESISTANT Resistant     VANCOMYCIN 0.25 SENSITIVE Sensitive     * MODERATE STREPTOCOCCUS GALLOLYTICUS  Carbapenem Resistance Panel     Status: None   Collection Time: 01/21/22  3:30 PM  Result Value Ref Range Status   Carba Resistance IMP Gene NOT DETECTED NOT DETECTED Final   Carba Resistance VIM Gene NOT DETECTED NOT DETECTED Final   Carba Resistance NDM Gene NOT DETECTED NOT DETECTED Final   Carba Resistance KPC Gene NOT DETECTED NOT DETECTED Final   Carba Resistance OXA48 Gene NOT DETECTED NOT DETECTED Final    Comment: (NOTE) Cepheid Carba-R is an FDA-cleared nucleic acid amplification test  (NAAT)for the detection and differentiation of genes encoding the  most prevalent carbapenemases in bacterial isolate samples. Carbapenemase gene identification and implementation of comprehensive  infection control measures are recommended by the CDC to prevent the  spread of the resistant organisms. Performed at [facility], [place] [address], [place], [place] [address]   Susceptibility, Aer + Anaerob     Status: Abnormal   Collection Time: 01/21/22  3:30 PM  Result Value Ref Range Status   Suscept, Aer + Anaerob Final report (A)  Corrected    Comment: (NOTE) Performed At: [facility] [address] [place], [place] [id number] Rush Farmer MD [id number] CORRECTED ON 09/13 AT 0636: PREVIOUSLY REPORTED AS Preliminary report    Source of Sample   Final    [id number] Drew SUSCEPTIBILITY TESTING [facility] ABSCESS  GALL BLADDER    Comment: Performed at [facility], [place] [address] [place], [place] [address]  Susceptibility Result     Status: Abnormal   Collection Time: 01/21/22  3:30 PM  Result Value Ref Range Status   Suscept Result 1 Escherichia coli (A)  Final  Comment: (NOTE) Identification performed by account, not confirmed by this laboratory. Multi-Drug Resistant Organism Susceptibility profile is consistent with a probable ESBL.    Antimicrobial Suscept Comment  Corrected    Comment: (NOTE)      ** S = Susceptible; I = Intermediate; R = Resistant **                   P = Positive; N = Negative            MICS are expressed in micrograms per mL   Antibiotic                 RSLT#1    RSLT#2    RSLT#3    RSLT#4 Amoxicillin/Clavulanic Acid    R Ampicillin                     R Cefazolin                      R Cefepime                       I Ceftriaxone                    R Cefuroxime                     R Ciprofloxacin                  R Ertapenem                      S Gentamicin                     S Imipenem                       S Levofloxacin                   R Meropenem                      S Nitrofurantoin                 S Piperacillin/Tazobactam        R Tetracycline                   S Tobramycin                     S Trimethoprim/Sulfa             S Performed At: [facility] [employer] [place], [place] [id number] Rush Farmer MD [id number]   Min Inhibitory Conc (2 Drugs)     Status: None   Collection Time: 01/21/22  3:30 PM  Result Value Ref Range Status   Min Inhibitory Conc (2 Drugs) Final report  Final    Comment: (NOTE) Performed At: [facility] [place], [place] [id number] Rush Farmer MD [id number]    Source ([place]) Gall Bladder Aspirate  Final    Comment: Performed at [facility], [place] [address], [place], [place] [address]  MIC Results (2 Drugs)     Status: None   Collection Time: 01/21/22   3:30 PM  Result Value Ref Range Status   RESULT 5 MIC (RESULT 1)  Base Name: Escherichia coli  Final    Comment: (NOTE) Identification performed by account, not confirmed by this laboratory. AVYCAZ            <=8 UG/ML = SUSCEPTIBLE TIGECYCLINE       <=2 UG/ML = SUSCEPTIBLE Performed At: [facility] [place], [place] [id number] Rush Farmer MD [id number]     Coagulation Studies: No results for input(s): "LABPROT", "INR" in the last 72 hours.    Urinalysis: No results for input(s): "COLORURINE", "LABSPEC", "PHURINE", "GLUCOSEU", "HGBUR", "BILIRUBINUR", "KETONESUR", "PROTEINUR", "UROBILINOGEN", "NITRITE", "LEUKOCYTESUR" in the last 72 hours.  Invalid input(s): "APPERANCEUR"    Imaging: No results found.   Medications:    anticoagulant sodium citrate      aspirin EC  81 mg Oral Daily   atorvastatin  40 mg Oral Daily   calcium acetate  1,334 mg Oral TID WC   Chlorhexidine Gluconate Cloth  6 each Topical Q0600   chlorproMAZINE  25 mg Oral TID   clopidogrel  75 mg Oral Daily   influenza vaccine adjuvanted  0.5 mL Intramuscular Tomorrow-1000   insulin aspart  0-5 Units Subcutaneous QHS   insulin aspart  0-6 Units Subcutaneous TID WC   insulin detemir  10 Units Subcutaneous QHS   levofloxacin  500 mg Oral Q48H   levothyroxine  137 mcg Oral Q0600   metoprolol succinate  25 mg Oral Daily   multivitamin  1 tablet Oral Daily   pantoprazole  40 mg Oral Daily   sodium chloride flush  5 mL Intracatheter Daily   acetaminophen **OR** acetaminophen, albuterol, alteplase, alum & mag hydroxide-simeth, anticoagulant sodium citrate, calcium carbonate, heparin, hydrALAZINE, lidocaine (PF), lidocaine-prilocaine, nitroGLYCERIN, ondansetron **OR** ondansetron (ZOFRAN) IV, pentafluoroprop-tetrafluoroeth, senna-docusate  Chest x-ray done on March 09, 2022 IMPRESSION: 1. Cardiomegaly with vascular congestion and pulmonary edema 2. Worsening  airspace disease at the right greater than left lung base, atelectasis versus pneumonia       Assessment/ Plan:  [name] is a 76 y.o.  male with a past medical history of hypertension, end-stage renal disease, on hemodialysis on Monday Wednesday Friday schedule, diabetes mellitus, CVA who was brought to the ER with chief complaint of weakness.  [facility]/TTS/right chest PermCath  End-stage renal disease on hemodialysis.         Patient is on Tuesday Thursday Saturday schedule as an outpatient       Received dialysis yesterday, 2L Uf achieved. Next treatment scheduled for Saturday.     2. Anemia of chronic kidney disease  Lab Results  Component Value Date   HGB 9.2 (L) 03/13/2022    Hemoglobin at goal  3. Secondary Hyperparathyroidism: with outpatient labs: PTH 422, phosphorus 6.4, calcium 9.1 on 12/29/2021.   Lab Results  Component Value Date   CALCIUM 8.5 (L) 03/12/2022   CAION 0.83 (LL) 12/10/2020   PHOS 4.1 03/12/2022    Will continue to monitor bone minerals  4.  Hypertension with chronic kidney disease.  Home regimen includes hydralazine and losartan.  Currently receiving these medications along with metoprolol.   Blood pressure stable for this patient.  5. Diabetes mellitus type II with chronic kidney disease insulin dependent. Home regimen includes NovoLog and Levemir. Most recent hemoglobin A1c is 8.1 on 01/18/2022.  Currently prescribed Januvia outpatient.  Currently held.  Sliding scale insulin managed per primary team.  Glucose stable  6.  Acute cholecystitis seen on CT.  Surgery consulted.  IR placed percutaneous drain on 01/21/22 and stabilizing  with medications.  Plan for percutaneous drain for 6 weeks.  Completed antibiotic regimen of Flagyl and Avycaz.    7.  Hyponatremia Patient has low sodium secondary to ESRD/inability to get rid of free water  8.  Non ST elevation MI Heparin drip stopped, patient placed on Plavix.  9. Acute systolic  CHF Patient 2D echo done on October 17 had shown patient EF was at around 40-45%   10. Community-acquired pneumonia Patient is on azithromycin and ceftriaxone Patient is clinically improving    LOS: 4 Shantelle Breeze 10/20/20231:52 PM  I saw and evaluated the patient with Colon Flattery, NP.  I personally formulated the plan of care.  I agree with the findings and plan as documented in the note except as noted .

## 2022-03-13 NOTE — Discharge Summary (Signed)
Physician Discharge Summary   Patient: Joseph Mcpherson MRN: [id number] DOB: [birth year]  Admit date:     03/09/2022  Discharge date: 03/13/22  Discharge Physician: Lorella Nimrod   PCP: [name], MD   Recommendations at discharge:  Please in CBC and BMP in 1 week Follow-up with cardiology Follow-up with primary care provider  Discharge Diagnoses: Principal Problem:   NSTEMI (non-ST elevated myocardial infarction) [facility]) Active Problems:   Volume overload   End stage renal disease ([place])   Generalized weakness   Essential hypertension   Insulin dependent type 2 diabetes mellitus ([place])   Anemia in chronic kidney disease   CAP (community acquired pneumonia)   Hypothyroidism   Mixed hyperlipidemia   History of CVA (cerebrovascular accident)   Acute clinical systolic heart failure ([place])  Resolved Problems:   Type 2 diabetes mellitus with diabetic neuropathy, unspecified ([place])   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[place])   Neurocognitive deficits   Iron deficiency anemia, unspecified   Secondary hyperparathyroidism of renal origin [facility])  Hospital Course: [name] is a 76 year old male with history of end-stage renal disease on hemodialysis Tuesday Thursday Saturday, hypertension, insulin-dependent diabetes mellitus, neuropathy, GERD, hypothyroid, hard of hearing, who presents to the emergency department for chief concerns of dyspnea, and diaphoresis.  Initial vitals in the emergency department showed temperature of 98.1, respiration rate of 23, heart rate of 113, blood pressure 161/106, SPO2 of 82% on room air and patient was placed on BiPAP with improvement to 99% on room air.  Serum sodium is 133, potassium 4.9, chloride 95, bicarb 22, BUN of 21, serum creatinine of 7.28, nonfasting blood glucose 182, EGFR is 7, WBC 13.7, hemoglobin 9.7, platelets of 242.  Lactic acid is 1.8.  High sensitive troponin was elevated at 982.  BNP elevated 2687.5.  ED treatment: Heparin  GGT, furosemide 40 mg IV one-time dose, nitroglycerin 0.5 inch.  EDP consulted nephrology and cardiology.  10/18: Troponin peaked at 2062.  Cardiology is recommending medical management. Patient remain on heparin infusion for the past 2 days, will complete 48 hours later today. Echocardiogram with EF of 40 to 45% and regional wall motion abnormalities along with moderate mitral regurgitation.  10/19: Based on his recent underlying comorbidities with pneumonia and cholecystitis s/p cholecystostomy drain in place cardiology is recommending outpatient ischemic work-up and follow-up with his cardiologist in 1 to 2 weeks after discharge.  They also added beta-blocker along with DAPT with aspirin and Plavix.  Hemoglobin decreased to 7.7 today, ordered 1 unit of PRBC to keep the hemoglobin above 8 due to recent ACS. Procalcitonin with slight improvement to 4.72.  Upper respiratory symptoms improving. Patient had his dialysis today.  10/20: Patient remained stable, on room air now.  Cardiology would like to do further ischemic work-up including cardiac catheterization as an outpatient after recovering completely from current pneumonia.  Procalcitonin continues to improve.  Stable for discharge, he is being discharged on 2 more doses of Levaquin, 48 hours apart to complete the course. Cardiology also switched him to high intensity statin and added Plavix which he will take together with aspirin.  Patient will continue taking current medications and need to have a close follow-up with his providers for further recommendations.  Patient is high risk for decompensation and mortality based on age and underlying comorbidities with recent NSTEMI and concern of pneumonia.   Assessment and Plan: * NSTEMI (non-ST elevated myocardial infarction) ([place]) Troponin peaked above 2000, now trending down.  Echocardiogram with regional wall  motion abnormalities.  Cardiology was consulted and they are recommending medical  management.  Completed 48 hours of IV heparin -Cardiology added beta-blocker and Plavix -Continue with as needed nitroglycerin. -Continue with aspirin and Lipitor -Outpatient ischemic work-up in 1 to 2 weeks per cardiology   Volume overload Volume is being managed with dialysis. Routine dialysis on TTS Nephrology is on board.  End stage renal disease ([place]) - On dialysis [facility] -Continue with scheduled dialysis  Generalized weakness - PT/OT evaluation   Essential hypertension Blood pressure mildly elevated. -Continue metoprolol -Holding home amlodipine and hydralazine as blood pressure is becoming soft at time -Continue to monitor -As needed hydralazine  Insulin dependent type 2 diabetes mellitus (HCC) - Insulin SSI with at bedtime coverage ordered  Anemia in chronic kidney disease Hemoglobin decreased to 7.7 today. -Ordered 1 unit of PRBC due to recent ACS  CAP (community acquired pneumonia) - Possible right lower lobe with elevated procalcitonin.  Improving upper respiratory symptoms, leukocytosis resolved and procalcitonin started trending down - Azithromycin, ceftriaxone -Continue with supportive care   Hypothyroidism - Patient takes levothyroxine 137 mcg daily  Mixed hyperlipidemia - Simvastatin was switched with atorvastatin  Acute clinical systolic heart failure (HCC) Echocardiogram with EF of 40 to 45% and regional wall motion abnormalities. - Strict I's and O's   History of CVA (cerebrovascular accident) - Resume aspirin 81 mg daily and statin   Consultants: Cardiology, nephrology Procedures performed: None Disposition: Home health Diet recommendation:  Discharge Diet Orders (From admission, onward)     Start     Ordered   03/13/22 0000  Diet - low sodium heart healthy        03/13/22 1330           Cardiac and Carb modified diet DISCHARGE MEDICATION: Allergies as of 03/13/2022   No Known Allergies      Medication List     STOP  taking these medications    docusate sodium 100 MG capsule Commonly known as: COLACE   hydrALAZINE 50 MG tablet Commonly known as: APRESOLINE   levocetirizine 5 MG tablet Commonly known as: XYZAL   simvastatin 20 MG tablet Commonly known as: ZOCOR       TAKE these medications    albuterol (2.5 MG/3ML) 0.083% nebulizer solution Commonly known as: PROVENTIL Take 3 mLs (2.5 mg total) by nebulization every 6 (six) hours as needed for wheezing or shortness of breath.   amLODipine 10 MG tablet Commonly known as: NORVASC Take 1 tablet (10 mg total) by mouth daily.   aspirin EC 81 MG tablet Take 81 mg by mouth daily. Swallow whole.   atorvastatin 40 MG tablet Commonly known as: LIPITOR Take 1 tablet (40 mg total) by mouth daily. Start taking on: March 14, 2022   BD Pen Needle Nano 2nd Gen 32G X 4 MM Misc Generic drug: Insulin Pen Needle USE AS DIRECTED   calcium acetate 667 MG capsule Commonly known as: PHOSLO TAKE 2 CAPSULES BY MOUTH 3 TIMES A DAY WITH MEAL   chlorproMAZINE 25 MG tablet Commonly known as: THORAZINE TAKE 1 TABLET BY MOUTH THREE TIMES A DAY   clopidogrel 75 MG tablet Commonly known as: PLAVIX Take 1 tablet (75 mg total) by mouth daily. Start taking on: March 14, 2022   donepezil 10 MG tablet Commonly known as: ARICEPT TAKE 1 TABLET BY MOUTH EVERY DAY   FreeStyle Libre 14 Day Sensor Misc APPLY EVERY 14 (FOURTEEN) DAYS.   gabapentin 300 MG capsule Commonly known  as: NEURONTIN TAKE 1 CAPSULE BY MOUTH EVERY DAY AT BEDTIME AS DIRECTED   insulin detemir 100 UNIT/ML injection Commonly known as: LEVEMIR Inject 0.15 mLs (15 Units total) into the skin at bedtime.   ipratropium-albuterol 0.5-2.5 (3) MG/3ML Soln Commonly known as: DUONEB Take 3 mLs by nebulization every 6 (six) hours as needed.   levofloxacin 500 MG tablet Commonly known as: LEVAQUIN Take 1 tablet (500 mg total) by mouth every other day for 2 doses. Started on Sunday    levothyroxine 137 MCG tablet Commonly known as: SYNTHROID TAKE 1 TABLET BY MOUTH EVERY DAY   losartan 100 MG tablet Commonly known as: COZAAR Take 1 tablet (100 mg total) by mouth every evening.   metoprolol succinate 50 MG 24 hr tablet Commonly known as: TOPROL-XL TAKE 1 TABLET BY MOUTH EVERY DAY   multivitamin with minerals tablet Take 1 tablet by mouth daily.   pantoprazole 40 MG tablet Commonly known as: PROTONIX TAKE 1 TABLET BY MOUTH EVERY DAY   Rena-Vite Rx 1 MG Tabs Take 1 tablet by mouth daily.   sevelamer carbonate 2.4 g Pack Commonly known as: RENVELA Take 2.4 g by mouth daily.   sitaGLIPtin 50 MG tablet Commonly known as: JANUVIA Take 50 mg by mouth every evening.   sodium chloride flush 0.9 % Soln Commonly known as: NS Inject 5 mLs into the vein daily. Flush cholecystostomy tube daily with 5 ccs of NS   Vitamin D3 125 MCG (5000 UT) Tabs Take 5,000 Units by mouth daily.        Follow-up Information     Callwood, Dwayne D, MD. Go in 1 week(s).   Specialties: Cardiology, Internal Medicine Contact information: [address] [place] [address] [phone]         Masoud, Javed, MD. Schedule an appointment as soon as possible for a visit in 1 week(s).   Specialties: Internal Medicine, Cardiology Contact information: [address] [place] [address] [phone]                Discharge Exam: Filed Weights   03/11/22 1625 03/12/22 0800 03/12/22 1135  Weight: 67 kg 68.1 kg 66.1 kg   General.  Frail elderly man, in no acute distress. Pulmonary.  Lungs clear bilaterally, normal respiratory effort. CV.  Regular rate and rhythm, no JVD, rub or murmur. Abdomen.  Soft, nontender, nondistended, BS positive. CNS.  Alert and oriented .  No focal neurologic deficit. Extremities.  No edema, no cyanosis, pulses intact and symmetrical. Psychiatry.  Judgment and insight appears normal.   Condition at discharge: stable  The  results of significant diagnostics from this hospitalization (including imaging, microbiology, ancillary and laboratory) are listed below for reference.   Imaging Studies: ECHOCARDIOGRAM COMPLETE  Result Date: 03/10/2022    ECHOCARDIOGRAM REPORT   Patient Name:   Joseph Mcpherson Date of Exam: 03/10/2022 Medical Rec #:  [id number]   Height:       64.0 in Accession #:    [id number]  Weight:       153.0 lb Date of Birth:  02/25/1946    BSA:          1.746 m Patient Age:    76 years    BP:           113/49 mmHg Patient Gender: M           HR:           68  bpm. Exam Location:  [facility] Procedure: 2D Echo, Cardiac  Doppler and Color Doppler Indications:     R06.00 Dyspnea  History:         Patient has prior history of Echocardiogram examinations, most                  recent 05/05/2019. Patient on dialysis; Risk                  Factors:Hypertension, Dyslipidemia and Diabetes.  Sonographer:     Rosalia Hammers Referring Phys:  [id number] AMY N COX Diagnosing Phys: Serafina Royals MD  Sonographer Comments: Image acquisition challenging due to respiratory motion. IMPRESSIONS  1. Left ventricular ejection fraction, by estimation, is 40 to 45%. The left ventricle has mildly decreased function. The left ventricle demonstrates regional wall motion abnormalities (see scoring diagram/findings for description). Left ventricular diastolic parameters were normal.  2. Right ventricular systolic function is normal. The right ventricular size is normal.  3. Left atrial size was mildly dilated.  4. The mitral valve is normal in structure. Moderate mitral valve regurgitation.  5. The aortic valve is normal in structure. Aortic valve regurgitation is trivial. FINDINGS  Left Ventricle: Left ventricular ejection fraction, by estimation, is 40 to 45%. The left ventricle has mildly decreased function. The left ventricle demonstrates regional wall motion abnormalities. Moderate hypokinesis of the left ventricular, mid-apical inferoseptal wall, apical  segment and anteroseptal wall. The left ventricular internal cavity size was normal in size. There is no left ventricular hypertrophy. Left ventricular diastolic parameters were normal. Right Ventricle: The right ventricular size is normal. No increase in right ventricular wall thickness. Right ventricular systolic function is normal. Left Atrium: Left atrial size was mildly dilated. Right Atrium: Right atrial size was normal in size. Pericardium: There is no evidence of pericardial effusion. Mitral Valve: The mitral valve is normal in structure. Moderate mitral valve regurgitation. Tricuspid Valve: The tricuspid valve is normal in structure. Tricuspid valve regurgitation is mild. Aortic Valve: The aortic valve is normal in structure. Aortic valve regurgitation is trivial. Aortic valve mean gradient measures 3.0 mmHg. Aortic valve peak gradient measures 6.1 mmHg. Aortic valve area, by VTI measures 1.73 cm. Pulmonic Valve: The pulmonic valve was normal in structure. Pulmonic valve regurgitation is trivial. Aorta: The aortic root and ascending aorta are structurally normal, with no evidence of dilitation. IAS/Shunts: No atrial level shunt detected by color flow Doppler.  LEFT VENTRICLE PLAX 2D LVIDd:         4.60 cm   Diastology LVIDs:         3.00 cm   LV e' medial:    5.44 cm/s LV PW:         1.30 cm   LV E/e' medial:  21.3 LV IVS:        1.20 cm   LV e' lateral:   8.27 cm/s LVOT diam:     1.80 cm   LV E/e' lateral: 14.0 LV SV:         46 LV SV Index:   27 LVOT Area:     2.54 cm  RIGHT VENTRICLE RV Basal diam:  2.80 cm RV Mid diam:    2.60 cm RV S prime:     14.10 cm/s TAPSE (M-mode): 1.2 cm LEFT ATRIUM             Index        RIGHT ATRIUM           Index LA diam:        3.90 cm 2.23  cm/m   RA Area:     15.20 cm LA Vol (A2C):   50.1 ml 28.70 ml/m  RA Volume:   39.70 ml  22.74 ml/m LA Vol (A4C):   46.3 ml 26.52 ml/m LA Biplane Vol: 51.3 ml 29.38 ml/m  AORTIC VALVE                    PULMONIC VALVE AV Area  (Vmax):    1.65 cm     PR End Diast Vel: 2.77 msec AV Area (Vmean):   1.69 cm AV Area (VTI):     1.73 cm AV Vmax:           123.00 cm/s AV Vmean:          81.600 cm/s AV VTI:            0.267 m AV Peak Grad:      6.1 mmHg AV Mean Grad:      3.0 mmHg LVOT Vmax:         79.60 cm/s LVOT Vmean:        54.200 cm/s LVOT VTI:          0.182 m LVOT/AV VTI ratio: 0.68  AORTA Ao Root diam: 2.80 cm MITRAL VALVE                TRICUSPID VALVE MV Area (PHT): 3.74 cm     TR Peak grad:   18.8 mmHg MV Decel Time: 203 msec     TR Vmax:        217.00 cm/s MV E velocity: 116.00 cm/s MV A velocity: 82.10 cm/s   SHUNTS MV E/A ratio:  1.41         Systemic VTI:  0.18 m                             Systemic Diam: 1.80 cm Serafina Royals MD Electronically signed by Serafina Royals MD Signature Date/Time: 03/10/2022/5:02:52 PM    Final    DG Chest Portable 1 View  Result Date: 03/09/2022 CLINICAL DATA:  Shortness of breath EXAM: PORTABLE CHEST 1 VIEW COMPARISON:  03/09/2022, CT 01/20/2022, radiograph 01/18/2022 FINDINGS: Right-sided central venous catheter tips over the right atrium. Left upper extremity vascular stent. Worsening airspace disease at the right greater than left lung base. Cardiomegaly with vascular congestion. No pneumothorax. IMPRESSION: 1. Cardiomegaly with vascular congestion and pulmonary edema 2. Worsening airspace disease at the right greater than left lung base, atelectasis versus pneumonia Electronically Signed   By: Donavan Foil M.D.   On: 03/09/2022 19:24   DG Chest 2 View  Result Date: 03/09/2022 CLINICAL DATA:  Cough EXAM: CHEST - 2 VIEW COMPARISON:  Chest x-ray dated September 18, 2021 FINDINGS: Unchanged position of right-sided central venous line. Cardiac and mediastinal contours are within normal limits. Mild bilateral interstitial opacities. Small bilateral pleural effusions. No evidence of pneumothorax. Partially visualized percutaneous cholecystostomy tube. IMPRESSION: 1. Mild pulmonary edema. 2.  Small bilateral pleural effusions. Electronically Signed   By: Yetta Glassman M.D.   On: 03/09/2022 15:49    Microbiology: Results for orders placed or performed during the hospital encounter of 01/18/22  Blood Culture (routine x 2)     Status: None   Collection Time: 01/18/22 11:51 AM   Specimen: BLOOD  Result Value Ref Range Status   Specimen Description BLOOD LEFT [facility]  Final   Special Requests   Final    BOTTLES DRAWN  AEROBIC AND ANAEROBIC Blood Culture results may not be optimal due to an excessive volume of blood received in culture bottles   Culture   Final    NO GROWTH 5 DAYS Performed at [facility], [address] [place], [place] [address]    Report Status 01/23/2022 FINAL  Final  Resp Panel by RT-PCR (Flu A&B, Covid) Anterior Nasal Swab     Status: None   Collection Time: 01/18/22 11:51 AM   Specimen: Anterior Nasal Swab  Result Value Ref Range Status   SARS Coronavirus 2 by RT PCR NEGATIVE NEGATIVE Final    Comment: (NOTE) SARS-CoV-2 target nucleic acids are NOT DETECTED.  The SARS-CoV-2 RNA is generally detectable in upper respiratory specimens during the acute phase of infection. The lowest concentration of SARS-CoV-2 viral copies this assay can detect is 138 copies/mL. A negative result does not preclude SARS-Cov-2 infection and should not be used as the sole basis for treatment or other patient management decisions. A negative result may occur with  improper specimen collection/handling, submission of specimen other than nasopharyngeal swab, presence of viral mutation(s) within the areas targeted by this assay, and inadequate number of viral copies(<138 copies/mL). A negative result must be combined with clinical observations, patient history, and epidemiological information. The expected result is Negative.  Fact Sheet for Patients:  EntrepreneurPulse.com.au  Fact Sheet for Healthcare Providers:   IncredibleEmployment.be  This test is no t yet approved or cleared by the [place] FDA and  has been authorized for detection and/or diagnosis of SARS-CoV-2 by FDA under an Emergency Use Authorization (EUA). This EUA will remain  in effect (meaning this test can be used) for the duration of the COVID-19 declaration under Section 564(b)(1) of the Act, 21 U.S.C.section 360bbb-3(b)(1), unless the authorization is terminated  or revoked sooner.       Influenza A by PCR NEGATIVE NEGATIVE Final   Influenza B by PCR NEGATIVE NEGATIVE Final    Comment: (NOTE) The Xpert Xpress SARS-CoV-2/FLU/RSV plus assay is intended as an aid in the diagnosis of influenza from Nasopharyngeal swab specimens and should not be used as a sole basis for treatment. Nasal washings and aspirates are unacceptable for Xpert Xpress SARS-CoV-2/FLU/RSV testing.  Fact Sheet for Patients: EntrepreneurPulse.com.au  Fact Sheet for Healthcare Providers: IncredibleEmployment.be  This test is not yet approved or cleared by the [place] FDA and has been authorized for detection and/or diagnosis of SARS-CoV-2 by FDA under an Emergency Use Authorization (EUA). This EUA will remain in effect (meaning this test can be used) for the duration of the COVID-19 declaration under Section 564(b)(1) of the Act, 21 U.S.C. section 360bbb-3(b)(1), unless the authorization is terminated or revoked.  Performed at [facility], [address] [place], [place] [address]   Blood Culture (routine x 2)     Status: None   Collection Time: 01/18/22  2:30 PM   Specimen: BLOOD  Result Value Ref Range Status   Specimen Description BLOOD RIGHT ARM  Final   Special Requests   Final    BOTTLES DRAWN AEROBIC AND ANAEROBIC Blood Culture adequate volume   Culture   Final    NO GROWTH 5 DAYS Performed at [facility], [address] [place], [place] [address]     Report Status 01/27/2022 FINAL  Final  Aerobic/Anaerobic Culture w Gram Stain (surgical/deep wound)     Status: None   Collection Time: 01/21/22  3:30 PM   Specimen: Abscess  Result Value Ref Range Status  Specimen Description   Final    ABSCESS GALL BLADDER Performed at [facility], [address] [place], [place] [address]    Special Requests   Final    NONE Performed at [facility], [address] [place], [place] [address]    Gram Stain   Final    FEW WBC PRESENT,BOTH PMN AND MONONUCLEAR RARE GRAM POSITIVE COCCI IN PAIRS    Culture   Final    FEW ESCHERICHIA COLI MULTI-DRUG RESISTANT ORGANISM CRITICAL RESULT CALLED TO, READ BACK BY AND VERIFIED WITH: PHARMD K. Kestenbaum [id number] FCP MODERATE STREPTOCOCCUS GALLOLYTICUS NO ANAEROBES ISOLATED SEE SEPARATE REPORT Performed at [facility], [place] [address] [place], [place] [address]    Report Status 02/19/2022 FINAL  Final   Organism ID, Bacteria ESCHERICHIA COLI  Final   Organism ID, Bacteria STREPTOCOCCUS GALLOLYTICUS  Final      Susceptibility   Escherichia coli - MIC*    AMPICILLIN >=32 RESISTANT Resistant     CEFAZOLIN >=64 RESISTANT Resistant     CEFEPIME 4 INTERMEDIATE Intermediate     CEFTAZIDIME >=64 RESISTANT Resistant     CEFTRIAXONE >=64 RESISTANT Resistant     CIPROFLOXACIN >=4 RESISTANT Resistant     GENTAMICIN <=1 SENSITIVE Sensitive     IMIPENEM RESISTANT Resistant     TRIMETH/SULFA <=20 SENSITIVE Sensitive     AMPICILLIN/SULBACTAM >=32 RESISTANT Resistant     PIP/TAZO 64 INTERMEDIATE Intermediate     * FEW ESCHERICHIA COLI   Streptococcus gallolyticus - MIC*    PENICILLIN 0.12 SENSITIVE Sensitive     CEFTRIAXONE <=0.12 SENSITIVE Sensitive     ERYTHROMYCIN >=8 RESISTANT Resistant     LEVOFLOXACIN >=16 RESISTANT Resistant     VANCOMYCIN 0.25 SENSITIVE Sensitive     * MODERATE STREPTOCOCCUS GALLOLYTICUS  Carbapenem Resistance Panel     Status: None   Collection Time:  01/21/22  3:30 PM  Result Value Ref Range Status   Carba Resistance IMP Gene NOT DETECTED NOT DETECTED Final   Carba Resistance VIM Gene NOT DETECTED NOT DETECTED Final   Carba Resistance NDM Gene NOT DETECTED NOT DETECTED Final   Carba Resistance KPC Gene NOT DETECTED NOT DETECTED Final   Carba Resistance OXA48 Gene NOT DETECTED NOT DETECTED Final    Comment: (NOTE) Cepheid Carba-R is an FDA-cleared nucleic acid amplification test  (NAAT)for the detection and differentiation of genes encoding the  most prevalent carbapenemases in bacterial isolate samples. Carbapenemase gene identification and implementation of comprehensive  infection control measures are recommended by the CDC to prevent the  spread of the resistant organisms. Performed at [facility], [place] [address], [place], [place] [address]   Susceptibility, Aer + Anaerob     Status: Abnormal   Collection Time: 01/21/22  3:30 PM  Result Value Ref Range Status   Suscept, Aer + Anaerob Final report (A)  Corrected    Comment: (NOTE) Performed At: [facility] [address] [place], [place] [id number] Rush Farmer MD [id number] CORRECTED ON 09/13 AT 0636: PREVIOUSLY REPORTED AS Preliminary report    Source of Sample   Final    [id number] [place] SUSCEPTIBILITY TESTING [facility] ABSCESS GALL BLADDER    Comment: Performed at [facility], [place] [address] [place], [place] [address]  Susceptibility Result     Status: Abnormal   Collection Time: 01/21/22  3:30 PM  Result Value Ref Range Status   Suscept Result 1 Escherichia coli (A)  Final  Comment: (NOTE) Identification performed by account, not confirmed by this laboratory. Multi-Drug Resistant Organism Susceptibility profile is consistent with a probable ESBL.    Antimicrobial Suscept Comment  Corrected    Comment: (NOTE)      ** S = Susceptible; I = Intermediate; R = Resistant **                   P = Positive; N = Negative            MICS are expressed  in micrograms per mL   Antibiotic                 RSLT#1    RSLT#2    RSLT#3    RSLT#4 Amoxicillin/Clavulanic Acid    R Ampicillin                     R Cefazolin                      R Cefepime                       I Ceftriaxone                    R Cefuroxime                     R Ciprofloxacin                  R Ertapenem                      S Gentamicin                     S Imipenem                       S Levofloxacin                   R Meropenem                      S Nitrofurantoin                 S Piperacillin/Tazobactam        R Tetracycline                   S Tobramycin                     S Trimethoprim/Sulfa             S Performed At: [facility] [employer] [place], [place] [id number] Rush Farmer MD [id number]   Min Inhibitory Conc (2 Drugs)     Status: None   Collection Time: 01/21/22  3:30 PM  Result Value Ref Range Status   Min Inhibitory Conc (2 Drugs) Final report  Final    Comment: (NOTE) Performed At: [facility] [place], [place] [id number] Rush Farmer MD BZ:[id number]    Source ([place]) Gall Bladder Aspirate  Final    Comment: Performed at [facility], [place] [address], [place], [place] [address]  MIC Results (2 Drugs)     Status: None   Collection Time: 01/21/22  3:30 PM  Result Value Ref Range Status   RESULT 5 MIC (RESULT 1)  Base Name: Escherichia coli  Final    Comment: (NOTE) Identification performed by account, not confirmed by this laboratory. AVYCAZ            <=8 UG/ML = SUSCEPTIBLE TIGECYCLINE       <=2 UG/ML = SUSCEPTIBLE Performed At: [facility] [place], [place] [id number] Rush Farmer MD OY:[id number]     Labs: CBC: Recent Labs  Lab 03/09/22 2044 03/10/22 0400 03/11/22 0505 03/12/22 0537 03/13/22 0551  WBC 25.7* 16.1* 11.5* 9.4  --   NEUTROABS 24.1*  --   --   --   --   HGB 8.8* 7.6* 8.1* 7.7* 9.2*  HCT 28.2* 24.8* 27.0* 25.3*  30.3*  MCV 86.8 87.3 87.9 87.2  --   PLT 311 263 284 257  --    Basic Metabolic Panel: Recent Labs  Lab 03/09/22 1901 03/10/22 0400 03/11/22 0505 03/12/22 0537  NA 133* 136 139 133*  K 4.9 4.0 4.2 4.3  CL 95* 100 104 101  CO2 22 27 24 25   GLUCOSE 186* 76 102* 187*  BUN 21 26* 19 29*  CREATININE 7.28* 7.81* 7.04* 8.16*  CALCIUM 8.8* 8.1* 8.8* 8.5*  PHOS  --   --  3.3 4.1   Liver Function Tests: Recent Labs  Lab 03/09/22 1901 03/11/22 0505 03/12/22 0537  AST 36  --   --   ALT 18  --   --   ALKPHOS 120  --   --   BILITOT 1.3*  --   --   PROT 7.5  --   --   ALBUMIN 3.1* 2.6* 2.6*   CBG: Recent Labs  Lab 03/12/22 1206 03/12/22 1616 03/12/22 2112 03/13/22 0736 03/13/22 1146  GLUCAP 97 154* 173* 87 142*    Discharge time spent: greater than 30 minutes.  This record has been created using Systems analyst. Errors have been sought and corrected,but may not always be located. Such creation errors do not reflect on the standard of care.   Signed: Lorella Nimrod, MD [facility] 03/13/2022

## 2022-03-13 NOTE — TOC Transition Note (Signed)
Transition of Care [facility]) - CM/SW Discharge Note   Patient Details  Name: Joseph Mcpherson MRN: [id number] Date of Birth: 1946/05/07  Transition of [facility] CM/SW Contact:  Candie Chroman, LCSW Phone Number: 03/13/2022, 1:52 PM   Clinical Narrative:   Patient has orders to discharge home today. Readmission prevention screen complete. CSW met with patient. Wife at bedside. CSW introduced role and explained that discharge planning would be discussed. PCP is [name], MD. Wife drives patient to appointments and HD. [facility] [address]. No issues obtaining medications. Patient was active with [facility] prior to admission. Liaison is aware of plan for discharge today. No DME use prior to admission. No DME recommendations at this time. No further concerns. CSW signing off.  Final next level of care: [place] Barriers to Discharge: No Barriers Identified   Patient Goals and CMS Choice     Choice offered to / list presented to : NA  Discharge Placement                Patient to be transferred to facility by: Wife Name of family member notified: Marlan Steward Patient and family notified of of transfer: 03/13/22  Discharge Plan and Services                          HH Arranged: RN, PT, OT [facility] Agency: [place] Date [place]: 03/13/22   Representative spoke with at [place]: [place] Determinants of Health (SDOH) Interventions     Readmission Risk Interventions    03/13/2022    1:51 PM  Readmission Risk Prevention Plan  Transportation Screening Complete  Medication Review (RN Care Manager) Complete  PCP or Specialist appointment within 3-5 days of discharge Complete  HRI or [place] Complete  SW Recovery Care/Counseling Consult Complete  [place] Not Applicable

## 2022-03-13 NOTE — Progress Notes (Signed)
Established hemodialysis patient known at [place] 11:50am. Spoke with patient and wife, wife confirmed she transports patient to treatment. No dialysis concerns were stated. Clinic is aware of admission. Will continue to follow for dialysis discharge planning.

## 2022-03-13 NOTE — Progress Notes (Signed)
[facility]    SUBJECTIVE: Patient states to be doing reasonably well somewhat improved reduced shortness of breath no pain tolerating dialysis well.  No abdominal discomfort status postcholecystectomy for cholecystitis now here with shortness of breath and pneumonia that is improved   Vitals:   03/13/22 0330 03/13/22 0401 03/13/22 0739 03/13/22 1149  BP: (!) 87/58 136/68 (!) 162/64 (!) 168/74  Pulse: 74 70 72 67  Resp: '18  18 18  '$ Temp: 98.6 F (37 C)  99.4 F (37.4 C) 97.9 F (36.6 C)  TempSrc:   Oral Oral  SpO2: 100%  98% 99%  Weight:      Height:         Intake/Output Summary (Last 24 hours) at 03/13/2022 1513 Last data filed at 03/13/2022 1300 Gross per 24 hour  Intake 1214.03 ml  Output --  Net 1214.03 ml      PHYSICAL EXAM  General: Well developed, well nourished, in no acute distress HEENT:  Normocephalic and atramatic Neck:  No JVD.  Lungs: Clear bilaterally to auscultation and percussion. Heart: HRRR . Normal S1 and S2 without gallops or murmurs.  Abdomen: Bowel sounds are positive, abdomen soft and non-tender  Msk:  Back normal, normal gait. Normal strength and tone for age. Extremities: No clubbing, cyanosis or edema.   Neuro: Alert and oriented X 3. Psych:  Good affect, responds appropriately   LABS: Basic Metabolic Panel: Recent Labs    03/11/22 0505 03/12/22 0537  NA 139 133*  K 4.2 4.3  CL 104 101  CO2 24 25  GLUCOSE 102* 187*  BUN 19 29*  CREATININE 7.04* 8.16*  CALCIUM 8.8* 8.5*  PHOS 3.3 4.1   Liver Function Tests: Recent Labs    03/11/22 0505 03/12/22 0537  ALBUMIN 2.6* 2.6*   No results for input(s): "LIPASE", "AMYLASE" in the last 72 hours. CBC: Recent Labs    03/11/22 0505 03/12/22 0537 03/13/22 0551  WBC 11.5* 9.4  --   HGB 8.1* 7.7* 9.2*  HCT 27.0* 25.3* 30.3*  MCV 87.9 87.2  --   PLT 284 257  --    Cardiac Enzymes: No results for input(s): "CKTOTAL", "CKMB", "CKMBINDEX", "TROPONINI" in the last 72  hours. BNP: Invalid input(s): "POCBNP" D-Dimer: No results for input(s): "DDIMER" in the last 72 hours. Hemoglobin A1C: No results for input(s): "HGBA1C" in the last 72 hours. Fasting Lipid Panel: No results for input(s): "CHOL", "HDL", "LDLCALC", "TRIG", "CHOLHDL", "LDLDIRECT" in the last 72 hours. Thyroid Function Tests: No results for input(s): "TSH", "T4TOTAL", "T3FREE", "THYROIDAB" in the last 72 hours.  Invalid input(s): "FREET3" Anemia Panel: No results for input(s): "VITAMINB12", "FOLATE", "FERRITIN", "TIBC", "IRON", "RETICCTPCT" in the last 72 hours.  No results found.   Echo echocardiogram individually evaluated by me and interpreted showed mildly depressed left ventricular function EF around 40 to 45%  TELEMETRY: Normal sinus rhythm rate of 70 nonspecific ST-T wave changes:  ASSESSMENT AND PLAN:  Principal Problem:   NSTEMI (non-ST elevated myocardial infarction) ([place]) Active Problems:   Essential hypertension   Mixed hyperlipidemia   Insulin dependent type 2 diabetes mellitus (HCC)   Hypothyroidism   End stage renal disease (HCC)   Anemia in chronic kidney disease   History of CVA (cerebrovascular accident)   Generalized weakness   Acute clinical systolic heart failure (HCC)   Volume overload   CAP (community acquired pneumonia)    Plan Non-STEMI stable recommend deferring cardiac cath and further ischemic work-up as an outpatient Recommend dialysis tomorrow then  potentially discharge tomorrow so would probably defer discharge today Cardiomyopathy appears to be worsening since 2020 may be related to non-STEMI follow-up echocardiogram may be beneficial after the patient recovers and is discharged Continue diabetes management and control with insulin Agree with reflux medications History of CVA with no residual deficits Patient's had pneumonia elevated white count now white count improved continue medical therapy End-stage renal disease on dialysis Tuesday  Thursday Saturday continue dialysis therapy follow-up dialysis tomorrow as an inpatient then discharged home Consider physical therapy as an outpatient to help with gait training and exercise and strength training Have the patient follow-up with cardiology 1 to 2 weeks with Dr. Saralyn Pilar Case discussed with patient and family   Yolonda Kida, MD 03/13/2022 3:13 PM

## 2022-03-14 DIAGNOSIS — N2581 Secondary hyperparathyroidism of renal origin: Secondary | ICD-10-CM | POA: Diagnosis not present

## 2022-03-14 DIAGNOSIS — Z992 Dependence on renal dialysis: Secondary | ICD-10-CM | POA: Diagnosis not present

## 2022-03-14 DIAGNOSIS — N186 End stage renal disease: Secondary | ICD-10-CM | POA: Diagnosis not present

## 2022-03-16 ENCOUNTER — Telehealth: Payer: Self-pay | Admitting: *Deleted

## 2022-03-16 NOTE — Patient Outreach (Signed)
  Care Coordination [facility] Note Transition Care Management Follow-up Telephone Call Patient wife called back and requested that RN scheduled Dr appointment  since patient is receiving Dialysis T-THU . Needs appt after 11:00 PCP Hospital f/u appt confirmed? Yes  Scheduled to see  Dr Lavera Guise [phone] 12:00 [facility] f/u appt confirmed? Y Dr Clayborn Bigness 03/30/2022 2:00 PM   Are transportation arrangements needed? No  SDOH assessments and interventions completed:   Yes  Care Coordination Interventions Activated:  Yes   Care Coordination Interventions:  PCP follow up appointment requested   Encounter Outcome:  Pt. Visit Completed    [place] Management [phone]

## 2022-03-16 NOTE — Patient Outreach (Signed)
  Care Coordination [facility] Note Transition Care Management Follow-up Telephone Call Date of discharge and from where: [name] [phone] How have you been since you were released from the hospital? Doing good. Frustrated with drainage tube and want it out. Appointment schedule with surgeon 03/20/2022 Any questions or concerns? No  Items Reviewed: Did the pt receive and understand the discharge instructions provided? Yes  Medications obtained and verified? Yes  Other? No  Any new allergies since your discharge? No  Dietary orders reviewed? No Do you have support at home? Yes   Home Care and Equipment/Supplies: Were home health services ordered? no If so, what is the name of the agency? N/a  Has the agency set up a time to come to the patient's home? no Were any new equipment or medical supplies ordered?  No What is the name of the medical supply agency? N/a Were you able to get the supplies/equipment? not applicable Do you have any questions related to the use of the equipment or supplies? No  Functional Questionnaire: (I = Independent and D = Dependent) ADLs: D wife assists  Bathing/Dressing- D wife assists  Meal Prep- d wife prepares  Eating- I  Maintaining continence- I  Transferring/Ambulation- I  Managing Meds- I  Follow up appointments reviewed:  PCP Hospital f/u appt confirmed? No  Patient refused RN to assist with PCP appt. Son will make appointments [facility] f/u appt confirmed? N Patient refused RN to assist with PCP appt. Son will make appointments . Are transportation arrangements needed? No  Per wife she will take him If their condition worsens, is the pt aware to call PCP or go to the Emergency Dept.? Yes Was the patient provided with contact information for the PCP's office or ED? Yes Was to pt encouraged to call back with questions or concerns? Yes   SDOH assessments and interventions completed:   Yes  Care Coordination Interventions Activated:  Yes    Care Coordination Interventions:  No Care Coordination interventions needed at this time.   Encounter Outcome:  Pt. Visit Completed    [name] [phone]

## 2022-03-16 NOTE — Patient Outreach (Signed)
  Care Coordination [facility] Note Transition Care Management Unsuccessful Follow-up Telephone Call  Date of discharge and from where:  [facility] [phone]  Attempts:  1st Attempt  Reason for unsuccessful TCM follow-up call:  Left voice message  [place] Care Management [phone]

## 2022-03-17 DIAGNOSIS — N2581 Secondary hyperparathyroidism of renal origin: Secondary | ICD-10-CM | POA: Diagnosis not present

## 2022-03-17 DIAGNOSIS — N186 End stage renal disease: Secondary | ICD-10-CM | POA: Diagnosis not present

## 2022-03-17 DIAGNOSIS — Z992 Dependence on renal dialysis: Secondary | ICD-10-CM | POA: Diagnosis not present

## 2022-03-18 ENCOUNTER — Ambulatory Visit
Admission: RE | Admit: 2022-03-18 | Discharge: 2022-03-18 | Disposition: A | Discharge status: Home / Self Care | Payer: Medicare HMO | Source: Ambulatory Visit | Attending: Surgery | Admitting: Surgery

## 2022-03-18 ENCOUNTER — Ambulatory Visit: Payer: Medicare HMO | Admitting: Internal Medicine

## 2022-03-18 DIAGNOSIS — I2510 Atherosclerotic heart disease of native coronary artery without angina pectoris: Secondary | ICD-10-CM | POA: Diagnosis not present

## 2022-03-18 DIAGNOSIS — K810 Acute cholecystitis: Secondary | ICD-10-CM | POA: Diagnosis not present

## 2022-03-18 DIAGNOSIS — I69311 Memory deficit following cerebral infarction: Secondary | ICD-10-CM | POA: Diagnosis not present

## 2022-03-18 DIAGNOSIS — N186 End stage renal disease: Secondary | ICD-10-CM | POA: Diagnosis not present

## 2022-03-18 DIAGNOSIS — D631 Anemia in chronic kidney disease: Secondary | ICD-10-CM | POA: Diagnosis not present

## 2022-03-18 DIAGNOSIS — Z434 Encounter for attention to other artificial openings of digestive tract: Secondary | ICD-10-CM | POA: Diagnosis not present

## 2022-03-18 DIAGNOSIS — I700 Atherosclerosis of aorta: Secondary | ICD-10-CM | POA: Diagnosis not present

## 2022-03-18 DIAGNOSIS — E1122 Type 2 diabetes mellitus with diabetic chronic kidney disease: Secondary | ICD-10-CM | POA: Diagnosis not present

## 2022-03-18 DIAGNOSIS — G9341 Metabolic encephalopathy: Secondary | ICD-10-CM | POA: Diagnosis not present

## 2022-03-18 DIAGNOSIS — I120 Hypertensive chronic kidney disease with stage 5 chronic kidney disease or end stage renal disease: Secondary | ICD-10-CM | POA: Diagnosis not present

## 2022-03-18 DIAGNOSIS — Z4803 Encounter for change or removal of drains: Secondary | ICD-10-CM | POA: Diagnosis not present

## 2022-03-18 HISTORY — PX: IR CHOLANGIOGRAM EXISTING TUBE: IMG6040

## 2022-03-18 MED ORDER — IOHEXOL 300 MG/ML  SOLN
7.0000 mL | Freq: Once | INTRAMUSCULAR | Status: AC | PRN
  Administered 2022-03-18: 7 mL

## 2022-03-18 NOTE — Procedures (Signed)
Interventional Radiology Procedure:   Indications: History of cholecystitis and cholecystostomy tube placement. Evaluate cystic duct and CBD.  Procedure: Cholangiogram through existing drain  Findings: Cystic duct and CBD are patent.  Drain is appropriately positioned in decompressed gallbladder.  Complications: No immediate complications noted.     EBL: None  Plan: Patient deferred a capping trial.  Drain attached to gravity bag. Will follow up with General Surgery   Kinaya Hilliker R. Anselm Pancoast, MD  Pager: [phone]

## 2022-03-18 NOTE — Progress Notes (Deleted)
Established Patient Office Visit  Subjective:  Patient ID: Joseph Mcpherson, male    DOB: 11/26/1945  Age: 76 y.o. MRN: [id number]  CC: No chief complaint on file.   HPI  Joseph Mcpherson presents for ***  Past Medical History:  Diagnosis Date   Clotting disorder ([place])    per daughter, "when patient travels, his feet swell"   Diabetes mellitus without complication ([place])    type II   Diabetic eye exam ([place]) 12/2019   ESRD (end stage renal disease) ([place])    MWF- [place]   GERD (gastroesophageal reflux disease)    [name] (hard of hearing)    Hyperlipidemia    Hypertension    Hypothyroidism    Thyroid disease     Past Surgical History:  Procedure Laterality Date   AV FISTULA PLACEMENT Right 10/22/2017   Procedure: ARTERIOVENOUS (AV) BRACHIOCEPHALIC FISTULA CREATION RIGHT UPPER ARM;  Surgeon: Conrad [place], MD;  Location: [place];  Service: Vascular;  Laterality: Right;   AV FISTULA PLACEMENT Left 02/03/2019   Procedure: INSERTION OF ARTERIOVENOUS (AV) GORE-TEX GRAFT ARM (BRACHIAL AXILLARY );  Surgeon: [name], MD;  Location: [facility];  Service: Vascular;  Laterality: Left;   AV FISTULA PLACEMENT Left 12/10/2020   Procedure: LEFT ARM BRACHIOCEPHALIC ARTERIOVENOUS (AV) FISTULA CREATION;  Surgeon: [name], MD;  Location: [place];  Service: Vascular;  Laterality: Left;   IR PERC CHOLECYSTOSTOMY  01/21/2022   UPPER EXTREMITY ANGIOGRAPHY Left 09/19/2019   Procedure: UPPER EXTREMITY ANGIOGRAPHY;  Surgeon: [name], MD;  Location: [place] [facility];  Service: Cardiovascular;  Laterality: Left;    Family History  Problem Relation Age of Onset   Diabetes Mother    Hyperlipidemia Mother    Hypertension Father     Social History   Socioeconomic History   Marital status: Married    Spouse name: Not on file   Number of children: Not on file   Years of education: 12   Highest education level: 12th grade  Occupational History   Occupation: Retired  Tobacco Use    Smoking status: Never   Smokeless tobacco: Current    Types: Snuff  Vaping Use   Vaping Use: Never used  Substance and Sexual Activity   Alcohol use: No   Drug use: No   Sexual activity: Not Currently  Other Topics Concern   Not on file  Social History Narrative   Not on file   Social Determinants of Health   Financial Resource Strain: Low Risk  (06/19/2021)   Overall Financial Resource Strain (CARDIA)    Difficulty of Paying Living Expenses: Not hard at all  Food Insecurity: No Food Insecurity (03/11/2022)   Hunger Vital Sign    Worried About Running Out of Food in the Last Year: Never true    [place] in the Last Year: Never true  Transportation Needs: No Transportation Needs (03/11/2022)   PRAPARE - Hydrologist (Medical): No    Lack of Transportation (Non-Medical): No  Physical Activity: Sufficiently Active (06/19/2021)   Exercise Vital Sign    Days of Exercise per Week: 7 days    Minutes of Exercise per Session: 30 min  Stress: No Stress Concern Present (06/19/2021)   [place]    Feeling of Stress : Not at all  Social Connections: [place] (06/19/2021)   Social Connection and Isolation Panel [NHANES]    Frequency of  Communication with Friends and Family: More than three times a week    Frequency of Social Gatherings with Friends and Family: More than three times a week    Attends Religious Services: More than 4 times per year    Active Member of [employer] or Organizations: Yes    Attends Music therapist: More than 4 times per year    Marital Status: Married  Human resources officer Violence: Not At Risk (03/11/2022)   Humiliation, Afraid, Rape, and Kick questionnaire    Fear of Current or Ex-Partner: No    Emotionally Abused: No    Physically Abused: No    Sexually Abused: No     Current Outpatient Medications:    albuterol (PROVENTIL) (2.5  MG/3ML) 0.083% nebulizer solution, Take 3 mLs (2.5 mg total) by nebulization every 6 (six) hours as needed for wheezing or shortness of breath., Disp: 150 mL, Rfl: 1   amLODipine (NORVASC) 10 MG tablet, Take 1 tablet (10 mg total) by mouth daily., Disp: 30 tablet, Rfl: 3   aspirin EC 81 MG tablet, Take 81 mg by mouth daily. Swallow whole., Disp: , Rfl:    atorvastatin (LIPITOR) 40 MG tablet, Take 1 tablet (40 mg total) by mouth daily., Disp: 90 tablet, Rfl: 1   B Complex-C-Folic Acid (RENA-VITE RX) 1 MG TABS, Take 1 tablet by mouth daily. , Disp: , Rfl:    BD PEN NEEDLE NANO 2ND GEN 32G X 4 MM MISC, USE AS DIRECTED, Disp: 300 each, Rfl: 1   calcium acetate (PHOSLO) 667 MG capsule, TAKE 2 CAPSULES BY MOUTH 3 TIMES A DAY WITH MEAL, Disp: 540 capsule, Rfl: 1   chlorproMAZINE (THORAZINE) 25 MG tablet, TAKE 1 TABLET BY MOUTH THREE TIMES A DAY, Disp: 540 tablet, Rfl: 1   Cholecalciferol (VITAMIN D3) 5000 units TABS, Take 5,000 Units by mouth daily. , Disp: , Rfl:    clopidogrel (PLAVIX) 75 MG tablet, Take 1 tablet (75 mg total) by mouth daily., Disp: 90 tablet, Rfl: 1   Continuous Blood Gluc Sensor (FREESTYLE LIBRE 14 DAY SENSOR) MISC, APPLY EVERY 14 (FOURTEEN) DAYS., Disp: 90 each, Rfl: 1   donepezil (ARICEPT) 10 MG tablet, TAKE 1 TABLET BY MOUTH EVERY DAY, Disp: 90 tablet, Rfl: 3   gabapentin (NEURONTIN) 300 MG capsule, TAKE 1 CAPSULE BY MOUTH EVERY DAY AT BEDTIME AS DIRECTED, Disp: 90 capsule, Rfl: 3   insulin detemir (LEVEMIR) 100 UNIT/ML injection, Inject 0.15 mLs (15 Units total) into the skin at bedtime., Disp: 10 mL, Rfl: 11   ipratropium-albuterol (DUONEB) 0.5-2.5 (3) MG/3ML SOLN, Take 3 mLs by nebulization every 6 (six) hours as needed., Disp: 120 mL, Rfl: 4   levothyroxine (SYNTHROID) 137 MCG tablet, TAKE 1 TABLET BY MOUTH EVERY DAY, Disp: 90 tablet, Rfl: 3   losartan (COZAAR) 100 MG tablet, Take 1 tablet (100 mg total) by mouth every evening., Disp: 30 tablet, Rfl: 3   metoprolol succinate  (TOPROL-XL) 50 MG 24 hr tablet, TAKE 1 TABLET BY MOUTH EVERY DAY, Disp: 90 tablet, Rfl: 3   Multiple Vitamins-Minerals (MULTIVITAMIN WITH MINERALS) tablet, Take 1 tablet by mouth daily., Disp: , Rfl:    pantoprazole (PROTONIX) 40 MG tablet, TAKE 1 TABLET BY MOUTH EVERY DAY, Disp: 90 tablet, Rfl: 3   sevelamer carbonate (RENVELA) 2.4 g PACK, Take 2.4 g by mouth daily. (Patient not taking: Reported on 03/10/2022), Disp: 90 each, Rfl: 3   sitaGLIPtin (JANUVIA) 50 MG tablet, Take 50 mg by mouth every evening. , Disp: , Rfl:  sodium chloride flush (NS) 0.9 % SOLN, Inject 5 mLs into the vein daily. Flush cholecystostomy tube daily with 5 ccs of NS, Disp: 300 mL, Rfl: 0   No Known Allergies  ROS Review of Systems    Objective:    Physical Exam  There were no vitals taken for this visit. Wt Readings from Last 3 Encounters:  03/12/22 145 lb 11.6 oz (66.1 kg)  03/09/22 150 lb (68 kg)  02/25/22 146 lb 8 oz (66.5 kg)     Health Maintenance Due  Topic Date Due   OPHTHALMOLOGY EXAM  Never done   TETANUS/TDAP  Never done   FOOT EXAM  03/08/2020   COVID-19 Vaccine (6 - Pfizer series) 07/31/2021   INFLUENZA VACCINE  12/23/2021    There are no preventive care reminders to display for this patient.  Lab Results  Component Value Date   TSH 0.088 (L) 01/18/2022   Lab Results  Component Value Date   WBC 9.4 03/12/2022   HGB 9.2 (L) 03/13/2022   HCT 30.3 (L) 03/13/2022   MCV 87.2 03/12/2022   PLT 257 03/12/2022   Lab Results  Component Value Date   NA 133 (L) 03/12/2022   K 4.3 03/12/2022   CO2 25 03/12/2022   GLUCOSE 187 (H) 03/12/2022   BUN 29 (H) 03/12/2022   CREATININE 8.16 (H) 03/12/2022   BILITOT 1.3 (H) 03/09/2022   ALKPHOS 120 03/09/2022   AST 36 03/09/2022   ALT 18 03/09/2022   PROT 7.5 03/09/2022   ALBUMIN 2.6 (L) 03/12/2022   CALCIUM 8.5 (L) 03/12/2022   ANIONGAP 7 03/12/2022   EGFR 8 (L) 02/25/2022   Lab Results  Component Value Date   CHOL 144 05/05/2019    Lab Results  Component Value Date   HDL 29 (L) 05/05/2019   Lab Results  Component Value Date   LDLCALC 55 05/05/2019   Lab Results  Component Value Date   TRIG 301 (H) 05/05/2019   Lab Results  Component Value Date   CHOLHDL 5.0 05/05/2019   Lab Results  Component Value Date   HGBA1C 8.1 (H) 01/18/2022      Assessment & Plan:   Problem List Items Addressed This Visit   None   No orders of the defined types were placed in this encounter.   Follow-up: No follow-ups on file.    [name], MD

## 2022-03-19 DIAGNOSIS — N186 End stage renal disease: Secondary | ICD-10-CM | POA: Diagnosis not present

## 2022-03-19 DIAGNOSIS — Z992 Dependence on renal dialysis: Secondary | ICD-10-CM | POA: Diagnosis not present

## 2022-03-19 DIAGNOSIS — N2581 Secondary hyperparathyroidism of renal origin: Secondary | ICD-10-CM | POA: Diagnosis not present

## 2022-03-20 ENCOUNTER — Ambulatory Visit (INDEPENDENT_AMBULATORY_CARE_PROVIDER_SITE_OTHER): Payer: Medicare HMO | Admitting: Surgery

## 2022-03-20 ENCOUNTER — Encounter: Payer: Self-pay | Admitting: Surgery

## 2022-03-20 VITALS — BP 148/80 | HR 76 | Temp 98.7°F | Wt 145.4 lb

## 2022-03-20 DIAGNOSIS — K810 Acute cholecystitis: Secondary | ICD-10-CM

## 2022-03-20 NOTE — Patient Instructions (Addendum)
If you have any concerns or questions, please feel free to call our office. See follow up appointment below.   Cholecystitis Cholecystitis is irritation and swelling (inflammation) of the gallbladder. The gallbladder: Is an organ that is shaped like a pear. Is under the liver on the right side of the body. Stores bile. Bile helps the body break down (digest) the fats in food. This condition can occur all of a sudden. It needs to be treated. What are the causes? This condition may be caused by stones or lumps that form in the gallbladder (gallstones). Gallstones can block the tube (duct) that carries bile out of your gallbladder. Other causes include: Damage to the gallbladder due to less blood flow. Germs in the bile ducts. Scars, kinks, or adhesions in the bile ducts. Abnormal growths (tumors) in the liver, pancreas, or gallbladder. What increases the risk? You are more likely to develop this condition if: You are male and between the ages of 44-62. You take birth control pills. You use estrogen. You take certain medicines that make you more likely to develop gallstones. You are overweight (obese). You have a very bad reaction to an infection (sepsis). You have been hospitalized due to a serious condition, such as a burn or illness. You have not eaten or drank for a long time. What are the signs or symptoms? Symptoms of this condition include: Pain in the upper right part of the belly (abdomen). A lump over the gallbladder. Bloating in the belly. Feeling sick to your stomach (nauseous). Vomiting. Fever. Chills. How is this treated? This condition may be treated with: Medicines to treat pain. Giving fluids through an IV tube. Not eating or drinking (fasting). Antibiotic medicines. Surgery to take out your gallbladder. Gallbladder drainage. Follow these instructions at home: Medicines  Take over-the-counter and prescription medicines only as told by your doctor. If you  were prescribed an antibiotic medicine, take it as told by your doctor. Do not stop taking it even if you start to feel better. General instructions Follow instructions from your doctor about what to eat or drink. Do not eat or drink anything that makes you sick again. Do not smoke or use any products that contain nicotine or tobacco. If you need help quitting, ask your doctor. Keep all follow-up visits. Contact a doctor if: You have pain and your medicine does not help. You have a fever. Get help right away if: Your pain moves to: Another part of your belly. Your back. Your symptoms do not go away. You have new symptoms. These symptoms may be an emergency. Get help right away. Call 911. Do not wait to see if the symptoms will go away. Do not drive yourself to the hospital. Summary This condition may be caused by stones or lumps that form in the gallbladder (gallstones). A common symptom is pain in your belly. This condition may be treated with surgery to take out your gallbladder. Follow instructions from your doctor about what to eat or drink. This information is not intended to replace advice given to you by your health care provider. Make sure you discuss any questions you have with your health care provider. Document Revised: 11/12/2020 Document Reviewed: 11/12/2020 Elsevier Patient Education  [place].

## 2022-03-20 NOTE — Progress Notes (Signed)
03/20/2022  History of Present Illness: Joseph Mcpherson is a 76 y.o. male presenting for follow up of acute cholecystitis, s/p perc chole drain placement.  He was last seen on 10/16 with possible plans for surgery and cholangiogram was ordered to evaluate.  Unfortunately he presented to the ED on the same day due to shortness of breath and was found to have an NSTEMI and possible pneumonia.  He was hospitalized and evaluated by cardiology.  He was on heparin gtt temporarily and was started on Plavix.  No catheterization was done at the time.    He had his cholangiogram on 03/18/22 which showed a patent cystic duct and common bile duct.  Capping trial was offered but they wanted to speak with me prior to deciding.  Patient denies any current pain, nausea, vomiting, or coughing.   Past Medical History: Past Medical History:  Diagnosis Date   Clotting disorder ([facility])    per daughter, "when patient travels, his feet swell"   Diabetes mellitus without complication ([place])    type II   Diabetic eye exam ([place]) 12/2019   ESRD (end stage renal disease) ([place])    MWF- [place]   GERD (gastroesophageal reflux disease)    [name] (hard of hearing)    Hyperlipidemia    Hypertension    Hypothyroidism    Thyroid disease      Past Surgical History: Past Surgical History:  Procedure Laterality Date   AV FISTULA PLACEMENT Right 10/22/2017   Procedure: ARTERIOVENOUS (AV) BRACHIOCEPHALIC FISTULA CREATION RIGHT UPPER ARM;  Surgeon: Conrad Superior, MD;  Location: [facility];  Service: Vascular;  Laterality: Right;   AV FISTULA PLACEMENT Left 02/03/2019   Procedure: INSERTION OF ARTERIOVENOUS (AV) GORE-TEX GRAFT ARM (BRACHIAL AXILLARY );  Surgeon: [name], MD;  Location: [facility];  Service: Vascular;  Laterality: Left;   AV FISTULA PLACEMENT Left 12/10/2020   Procedure: LEFT ARM BRACHIOCEPHALIC ARTERIOVENOUS (AV) FISTULA CREATION;  Surgeon: [name], MD;  Location: [facility];  Service: Vascular;   Laterality: Left;   IR CHOLANGIOGRAM EXISTING TUBE  03/18/2022   IR PERC CHOLECYSTOSTOMY  01/21/2022   UPPER EXTREMITY ANGIOGRAPHY Left 09/19/2019   Procedure: UPPER EXTREMITY ANGIOGRAPHY;  Surgeon: [name], MD;  Location: [facility];  Service: Cardiovascular;  Laterality: Left;    Home Medications: Prior to Admission medications   Medication Sig Start Date End Date Taking? Authorizing Provider  albuterol (PROVENTIL) (2.5 MG/3ML) 0.083% nebulizer solution Take 3 mLs (2.5 mg total) by nebulization every 6 (six) hours as needed for wheezing or shortness of breath. 03/13/22  Yes Lorella Nimrod, MD  amLODipine (NORVASC) 10 MG tablet Take 1 tablet (10 mg total) by mouth daily. 02/02/22  Yes Fritzi Mandes, MD  aspirin EC 81 MG tablet Take 81 mg by mouth daily. Swallow whole.   Yes [provider]  atorvastatin (LIPITOR) 40 MG tablet Take 1 tablet (40 mg total) by mouth daily. 03/14/22  Yes Lorella Nimrod, MD  B Complex-C-Folic Acid (RENA-VITE RX) 1 MG TABS Take 1 tablet by mouth daily.    Yes [provider]  BD PEN NEEDLE NANO 2ND GEN 32G X 4 MM MISC USE AS DIRECTED 02/26/22  Yes Masoud, Viann Shove, MD  calcium acetate (PHOSLO) 667 MG capsule TAKE 2 CAPSULES BY MOUTH 3 TIMES A DAY WITH MEAL 02/16/22  Yes Masoud, Viann Shove, MD  chlorproMAZINE (THORAZINE) 25 MG tablet TAKE 1 TABLET BY MOUTH THREE TIMES A DAY 02/27/22  Yes [name], MD  Cholecalciferol (VITAMIN D3) 5000 units TABS Take 5,000 Units by mouth daily.    Yes [provider]  clopidogrel (PLAVIX) 75 MG tablet Take 1 tablet (75 mg total) by mouth daily. 03/14/22  Yes Lorella Nimrod, MD  Continuous Blood Gluc Sensor (FREESTYLE LIBRE 14 DAY SENSOR) MISC APPLY EVERY 14 (FOURTEEN) DAYS. 02/02/22  Yes Masoud, Viann Shove, MD  donepezil (ARICEPT) 10 MG tablet TAKE 1 TABLET BY MOUTH EVERY DAY 04/22/21  Yes Masoud, Viann Shove, MD  gabapentin (NEURONTIN) 300 MG capsule TAKE 1 CAPSULE BY MOUTH EVERY DAY AT BEDTIME AS DIRECTED  11/05/21  Yes Masoud, Viann Shove, MD  insulin detemir (LEVEMIR) 100 UNIT/ML injection Inject 0.15 mLs (15 Units total) into the skin at bedtime. 02/02/22  Yes Fritzi Mandes, MD  ipratropium-albuterol (DUONEB) 0.5-2.5 (3) MG/3ML SOLN Take 3 mLs by nebulization every 6 (six) hours as needed. 03/05/22  Yes Masoud, Viann Shove, MD  levothyroxine (SYNTHROID) 137 MCG tablet TAKE 1 TABLET BY MOUTH EVERY DAY 03/24/21  Yes Masoud, Viann Shove, MD  losartan (COZAAR) 100 MG tablet Take 1 tablet (100 mg total) by mouth every evening. 02/02/22  Yes Fritzi Mandes, MD  metoprolol succinate (TOPROL-XL) 50 MG 24 hr tablet TAKE 1 TABLET BY MOUTH EVERY DAY 01/23/22  Yes Masoud, Viann Shove, MD  Multiple Vitamins-Minerals (MULTIVITAMIN WITH MINERALS) tablet Take 1 tablet by mouth daily.   Yes [provider]  pantoprazole (PROTONIX) 40 MG tablet TAKE 1 TABLET BY MOUTH EVERY DAY 07/09/21  Yes Masoud, Viann Shove, MD  sitaGLIPtin (JANUVIA) 50 MG tablet Take 50 mg by mouth every evening.    Yes [provider]  sodium chloride flush (NS) 0.9 % SOLN Inject 5 mLs into the vein daily. Flush cholecystostomy tube daily with 5 ccs of NS 01/22/22 03/23/22 Yes [name], PA-C  sevelamer carbonate (RENVELA) 2.4 g PACK Take 2.4 g by mouth daily. Patient not taking: Reported on 03/10/2022 02/25/22   [name], MD    Allergies: No Known Allergies  Review of Systems: Review of Systems  Constitutional:  Negative for chills and fever.  Respiratory:  Negative for shortness of breath.   Cardiovascular:  Negative for chest pain.  Gastrointestinal:  Negative for abdominal pain, nausea and vomiting.    Physical Exam BP (!) 148/80   Pulse 76   Temp 98.7 F (37.1 C) (Oral)   Wt 145 lb 6.4 oz (66 kg)   SpO2 96%   BMI 24.96 kg/m  CONSTITUTIONAL: No acute distress, well nourished. HEENT:  Normocephalic, atraumatic, extraocular motion intact. RESPIRATORY:  Normal respiratory effort without pathologic use of accessory  muscles. CARDIOVASCULAR: Regular rhythm and rate. GI: The abdomen is soft, non-distended, non-tender.  Drain in place with bilious fluid in tubing/bag.  NEUROLOGIC:  Motor and sensation is grossly normal.  Cranial nerves are grossly intact. PSYCH:  Alert and oriented to person, place and time. Affect is normal.  Labs/Imaging: Cholangiogram on 03/18/22: IMPRESSION: 1. Cholecystostomy tube is slightly retracted but still appropriately positioned within the gallbladder. 2. Gallbladder is decompressed. Cystic duct and common bile duct are widely patent. Discussed a capping trial with the patient and wife but they preferred to continue with gravity drainage. Patient is scheduled to follow-up with general surgery.  Assessment and Plan: This is a 76 y.o. male with cholecystitis s/p perc chole drain  --Discussed with the patient that given his recent heart attack, we would not be able to schedule any surgery for a few months until cleared by cardiology so he could be off Plavix and the  stress of surgery would not cause more troubles.   --With regards to the drain, gave the options of continuing with the drain connected to gravity drainage as is currently, capping the drain for a trial, or removing the drain altogether.  They have opted for capping trial.  Unfortunately we do not have the equipment to cap the drain so will place new order for evaluation by IR for this.   --He can follow up with me in two weeks.  If no issues during capping trial, then we can remove the drain if he wishes.  I spent 20 minutes dedicated to the care of this patient on the date of this encounter to include pre-visit review of records, face-to-face time with the patient discussing diagnosis and management, and any post-visit coordination of care.   Melvyn Neth, [facility]

## 2022-03-21 DIAGNOSIS — N2581 Secondary hyperparathyroidism of renal origin: Secondary | ICD-10-CM | POA: Diagnosis not present

## 2022-03-21 DIAGNOSIS — Z992 Dependence on renal dialysis: Secondary | ICD-10-CM | POA: Diagnosis not present

## 2022-03-21 DIAGNOSIS — N186 End stage renal disease: Secondary | ICD-10-CM | POA: Diagnosis not present

## 2022-03-22 ENCOUNTER — Inpatient Hospital Stay
Admission: EM | Admit: 2022-03-22 | Discharge: 2022-03-26 | DRG: 177 | Disposition: A | Discharge status: Home Health | Payer: Medicare HMO | Attending: Internal Medicine | Admitting: Internal Medicine

## 2022-03-22 ENCOUNTER — Encounter: Payer: Self-pay | Admitting: Internal Medicine

## 2022-03-22 ENCOUNTER — Inpatient Hospital Stay: Payer: Medicare HMO

## 2022-03-22 ENCOUNTER — Other Ambulatory Visit: Payer: Self-pay

## 2022-03-22 ENCOUNTER — Emergency Department: Payer: Medicare HMO

## 2022-03-22 DIAGNOSIS — R0689 Other abnormalities of breathing: Secondary | ICD-10-CM | POA: Diagnosis not present

## 2022-03-22 DIAGNOSIS — I5021 Acute systolic (congestive) heart failure: Secondary | ICD-10-CM | POA: Diagnosis present

## 2022-03-22 DIAGNOSIS — F0150 Vascular dementia without behavioral disturbance: Secondary | ICD-10-CM | POA: Diagnosis present

## 2022-03-22 DIAGNOSIS — I252 Old myocardial infarction: Secondary | ICD-10-CM | POA: Diagnosis not present

## 2022-03-22 DIAGNOSIS — R0602 Shortness of breath: Secondary | ICD-10-CM | POA: Diagnosis not present

## 2022-03-22 DIAGNOSIS — N186 End stage renal disease: Secondary | ICD-10-CM | POA: Diagnosis not present

## 2022-03-22 DIAGNOSIS — E1140 Type 2 diabetes mellitus with diabetic neuropathy, unspecified: Secondary | ICD-10-CM | POA: Diagnosis present

## 2022-03-22 DIAGNOSIS — I2510 Atherosclerotic heart disease of native coronary artery without angina pectoris: Secondary | ICD-10-CM | POA: Diagnosis present

## 2022-03-22 DIAGNOSIS — J9601 Acute respiratory failure with hypoxia: Secondary | ICD-10-CM | POA: Diagnosis not present

## 2022-03-22 DIAGNOSIS — Z794 Long term (current) use of insulin: Secondary | ICD-10-CM | POA: Diagnosis not present

## 2022-03-22 DIAGNOSIS — E1122 Type 2 diabetes mellitus with diabetic chronic kidney disease: Secondary | ICD-10-CM | POA: Diagnosis present

## 2022-03-22 DIAGNOSIS — J9811 Atelectasis: Secondary | ICD-10-CM | POA: Diagnosis present

## 2022-03-22 DIAGNOSIS — E119 Type 2 diabetes mellitus without complications: Secondary | ICD-10-CM

## 2022-03-22 DIAGNOSIS — Z8673 Personal history of transient ischemic attack (TIA), and cerebral infarction without residual deficits: Secondary | ICD-10-CM

## 2022-03-22 DIAGNOSIS — N2581 Secondary hyperparathyroidism of renal origin: Secondary | ICD-10-CM | POA: Diagnosis present

## 2022-03-22 DIAGNOSIS — K810 Acute cholecystitis: Secondary | ICD-10-CM | POA: Diagnosis present

## 2022-03-22 DIAGNOSIS — T380X5A Adverse effect of glucocorticoids and synthetic analogues, initial encounter: Secondary | ICD-10-CM | POA: Diagnosis present

## 2022-03-22 DIAGNOSIS — I700 Atherosclerosis of aorta: Secondary | ICD-10-CM | POA: Diagnosis present

## 2022-03-22 DIAGNOSIS — E1169 Type 2 diabetes mellitus with other specified complication: Secondary | ICD-10-CM

## 2022-03-22 DIAGNOSIS — Z8249 Family history of ischemic heart disease and other diseases of the circulatory system: Secondary | ICD-10-CM

## 2022-03-22 DIAGNOSIS — E875 Hyperkalemia: Secondary | ICD-10-CM | POA: Diagnosis not present

## 2022-03-22 DIAGNOSIS — R0902 Hypoxemia: Secondary | ICD-10-CM

## 2022-03-22 DIAGNOSIS — Z992 Dependence on renal dialysis: Secondary | ICD-10-CM

## 2022-03-22 DIAGNOSIS — I132 Hypertensive heart and chronic kidney disease with heart failure and with stage 5 chronic kidney disease, or end stage renal disease: Secondary | ICD-10-CM | POA: Diagnosis not present

## 2022-03-22 DIAGNOSIS — J1282 Pneumonia due to coronavirus disease 2019: Secondary | ICD-10-CM | POA: Diagnosis not present

## 2022-03-22 DIAGNOSIS — I255 Ischemic cardiomyopathy: Secondary | ICD-10-CM | POA: Diagnosis present

## 2022-03-22 DIAGNOSIS — J810 Acute pulmonary edema: Secondary | ICD-10-CM | POA: Diagnosis not present

## 2022-03-22 DIAGNOSIS — E039 Hypothyroidism, unspecified: Secondary | ICD-10-CM | POA: Diagnosis present

## 2022-03-22 DIAGNOSIS — J189 Pneumonia, unspecified organism: Secondary | ICD-10-CM | POA: Diagnosis not present

## 2022-03-22 DIAGNOSIS — I5023 Acute on chronic systolic (congestive) heart failure: Secondary | ICD-10-CM | POA: Diagnosis present

## 2022-03-22 DIAGNOSIS — D631 Anemia in chronic kidney disease: Secondary | ICD-10-CM | POA: Diagnosis present

## 2022-03-22 DIAGNOSIS — E1165 Type 2 diabetes mellitus with hyperglycemia: Secondary | ICD-10-CM | POA: Diagnosis present

## 2022-03-22 DIAGNOSIS — I10 Essential (primary) hypertension: Secondary | ICD-10-CM | POA: Diagnosis not present

## 2022-03-22 DIAGNOSIS — K219 Gastro-esophageal reflux disease without esophagitis: Secondary | ICD-10-CM | POA: Diagnosis present

## 2022-03-22 DIAGNOSIS — Z833 Family history of diabetes mellitus: Secondary | ICD-10-CM

## 2022-03-22 DIAGNOSIS — U071 COVID-19: Secondary | ICD-10-CM | POA: Diagnosis not present

## 2022-03-22 DIAGNOSIS — Z7982 Long term (current) use of aspirin: Secondary | ICD-10-CM

## 2022-03-22 DIAGNOSIS — E785 Hyperlipidemia, unspecified: Secondary | ICD-10-CM | POA: Diagnosis present

## 2022-03-22 DIAGNOSIS — J188 Other pneumonia, unspecified organism: Secondary | ICD-10-CM | POA: Diagnosis present

## 2022-03-22 DIAGNOSIS — F17290 Nicotine dependence, other tobacco product, uncomplicated: Secondary | ICD-10-CM | POA: Diagnosis present

## 2022-03-22 DIAGNOSIS — F01A0 Vascular dementia, mild, without behavioral disturbance, psychotic disturbance, mood disturbance, and anxiety: Secondary | ICD-10-CM | POA: Diagnosis not present

## 2022-03-22 DIAGNOSIS — Z79899 Other long term (current) drug therapy: Secondary | ICD-10-CM

## 2022-03-22 DIAGNOSIS — F0390 Unspecified dementia without behavioral disturbance: Secondary | ICD-10-CM | POA: Diagnosis present

## 2022-03-22 DIAGNOSIS — J90 Pleural effusion, not elsewhere classified: Secondary | ICD-10-CM | POA: Diagnosis not present

## 2022-03-22 DIAGNOSIS — Z79890 Hormone replacement therapy (postmenopausal): Secondary | ICD-10-CM

## 2022-03-22 LAB — GLUCOSE, CAPILLARY
Glucose-Capillary: 223 mg/dL — ABNORMAL HIGH (ref 70–99)
Glucose-Capillary: 248 mg/dL — ABNORMAL HIGH (ref 70–99)

## 2022-03-22 LAB — CBC
HCT: 33.8 % — ABNORMAL LOW (ref 39.0–52.0)
Hemoglobin: 10.3 g/dL — ABNORMAL LOW (ref 13.0–17.0)
MCH: 26.3 pg (ref 26.0–34.0)
MCHC: 30.5 g/dL (ref 30.0–36.0)
MCV: 86.2 fL (ref 80.0–100.0)
Platelets: 304 10*3/uL (ref 150–400)
RBC: 3.92 MIL/uL — ABNORMAL LOW (ref 4.22–5.81)
RDW: 18 % — ABNORMAL HIGH (ref 11.5–15.5)
WBC: 13.4 10*3/uL — ABNORMAL HIGH (ref 4.0–10.5)
nRBC: 0 % (ref 0.0–0.2)

## 2022-03-22 LAB — BASIC METABOLIC PANEL
Anion gap: 10 (ref 5–15)
BUN: 17 mg/dL (ref 8–23)
CO2: 27 mmol/L (ref 22–32)
Calcium: 8.8 mg/dL — ABNORMAL LOW (ref 8.9–10.3)
Chloride: 98 mmol/L (ref 98–111)
Creatinine, Ser: 6.28 mg/dL — ABNORMAL HIGH (ref 0.61–1.24)
GFR, Estimated: 9 mL/min — ABNORMAL LOW (ref >60)
Glucose, Bld: 138 mg/dL — ABNORMAL HIGH (ref 70–99)
Potassium: 4.5 mmol/L (ref 3.5–5.1)
Sodium: 135 mmol/L (ref 135–145)

## 2022-03-22 LAB — RESP PANEL BY RT-PCR (FLU A&B, COVID) ARPGX2
Influenza A by PCR: NEGATIVE
Influenza B by PCR: NEGATIVE
SARS Coronavirus 2 by RT PCR: POSITIVE — AB

## 2022-03-22 LAB — TROPONIN I (HIGH SENSITIVITY)
Troponin I (High Sensitivity): 89 ng/L — ABNORMAL HIGH (ref <18)
Troponin I (High Sensitivity): 88 ng/L — ABNORMAL HIGH (ref <18)

## 2022-03-22 LAB — BRAIN NATRIURETIC PEPTIDE: B Natriuretic Peptide: >4500 pg/mL — ABNORMAL HIGH (ref 0.0–100.0)

## 2022-03-22 LAB — PROCALCITONIN: Procalcitonin: 0.27 ng/mL

## 2022-03-22 MED ORDER — INSULIN ASPART 100 UNIT/ML IJ SOLN
0.0000 [IU] | Freq: Three times a day (TID) | INTRAMUSCULAR | Status: DC
  Administered 2022-03-22: 2 [IU] via SUBCUTANEOUS
  Administered 2022-03-23: 1 [IU] via SUBCUTANEOUS
  Administered 2022-03-23: 3 [IU] via SUBCUTANEOUS
  Administered 2022-03-23: 1 [IU] via SUBCUTANEOUS
  Administered 2022-03-24: 2 [IU] via SUBCUTANEOUS
  Administered 2022-03-24 – 2022-03-26 (×2): 1 [IU] via SUBCUTANEOUS
  Filled 2022-03-22 (×7): qty 1

## 2022-03-22 MED ORDER — LEVOTHYROXINE SODIUM 137 MCG PO TABS
137.0000 ug | ORAL_TABLET | Freq: Every day | ORAL | Status: DC
  Administered 2022-03-23 – 2022-03-26 (×4): 137 ug via ORAL
  Filled 2022-03-22 (×4): qty 1

## 2022-03-22 MED ORDER — ATORVASTATIN CALCIUM 20 MG PO TABS
40.0000 mg | ORAL_TABLET | Freq: Every day | ORAL | Status: DC
  Administered 2022-03-23 – 2022-03-26 (×4): 40 mg via ORAL
  Filled 2022-03-22 (×4): qty 2

## 2022-03-22 MED ORDER — DONEPEZIL HCL 5 MG PO TABS
10.0000 mg | ORAL_TABLET | Freq: Every day | ORAL | Status: DC
  Administered 2022-03-22 – 2022-03-25 (×4): 10 mg via ORAL
  Filled 2022-03-22 (×4): qty 2

## 2022-03-22 MED ORDER — ONDANSETRON HCL 4 MG/2ML IJ SOLN: 4.0000 mg | Freq: Four times a day (QID) | INTRAMUSCULAR | Status: DC | PRN

## 2022-03-22 MED ORDER — ALBUTEROL SULFATE HFA 108 (90 BASE) MCG/ACT IN AERS
2.0000 | INHALATION_SPRAY | RESPIRATORY_TRACT | Status: DC | PRN
  Administered 2022-03-22 – 2022-03-24 (×3): 2 via RESPIRATORY_TRACT
  Filled 2022-03-22: qty 6.7

## 2022-03-22 MED ORDER — INSULIN DETEMIR 100 UNIT/ML ~~LOC~~ SOLN
15.0000 [IU] | Freq: Every day | SUBCUTANEOUS | Status: DC
  Administered 2022-03-22 – 2022-03-25 (×4): 15 [IU] via SUBCUTANEOUS
  Filled 2022-03-22 (×5): qty 0.15

## 2022-03-22 MED ORDER — CHLORPROMAZINE HCL 25 MG PO TABS
25.0000 mg | ORAL_TABLET | Freq: Three times a day (TID) | ORAL | Status: DC
  Administered 2022-03-22 – 2022-03-26 (×11): 25 mg via ORAL
  Filled 2022-03-22 (×13): qty 1

## 2022-03-22 MED ORDER — RENA-VITE PO TABS: 1.0000 | ORAL_TABLET | Freq: Every day | ORAL | Status: DC

## 2022-03-22 MED ORDER — GUAIFENESIN 100 MG/5ML PO LIQD
5.0000 mL | ORAL | Status: DC | PRN
  Administered 2022-03-22 – 2022-03-24 (×3): 5 mL via ORAL
  Filled 2022-03-22 (×3): qty 10

## 2022-03-22 MED ORDER — VITAMIN C 500 MG PO TABS
500.0000 mg | ORAL_TABLET | Freq: Every day | ORAL | Status: DC
  Administered 2022-03-22 – 2022-03-26 (×5): 500 mg via ORAL
  Filled 2022-03-22 (×5): qty 1

## 2022-03-22 MED ORDER — GABAPENTIN 300 MG PO CAPS
300.0000 mg | ORAL_CAPSULE | Freq: Every day | ORAL | Status: DC
  Administered 2022-03-22 – 2022-03-25 (×4): 300 mg via ORAL
  Filled 2022-03-22 (×4): qty 1

## 2022-03-22 MED ORDER — METHYLPREDNISOLONE SODIUM SUCC 40 MG IJ SOLR
0.5000 mg/kg | Freq: Two times a day (BID) | INTRAMUSCULAR | Status: AC
  Administered 2022-03-22 – 2022-03-25 (×6): 33.2 mg via INTRAVENOUS
  Filled 2022-03-22 (×6): qty 1

## 2022-03-22 MED ORDER — SODIUM CHLORIDE 0.9 % IV SOLN
100.0000 mg | Freq: Every day | INTRAVENOUS | Status: AC
  Administered 2022-03-23 – 2022-03-24 (×2): 100 mg via INTRAVENOUS
  Filled 2022-03-22: qty 20
  Filled 2022-03-22: qty 100

## 2022-03-22 MED ORDER — DEXAMETHASONE SODIUM PHOSPHATE 10 MG/ML IJ SOLN
10.0000 mg | Freq: Once | INTRAMUSCULAR | Status: AC
  Administered 2022-03-22: 10 mg via INTRAVENOUS
  Filled 2022-03-22: qty 1

## 2022-03-22 MED ORDER — METOPROLOL SUCCINATE ER 50 MG PO TB24
50.0000 mg | ORAL_TABLET | Freq: Every day | ORAL | Status: DC
  Administered 2022-03-23 – 2022-03-26 (×2): 50 mg via ORAL
  Filled 2022-03-22 (×2): qty 1

## 2022-03-22 MED ORDER — SODIUM CHLORIDE 0.9 % IV SOLN
500.0000 mg | INTRAVENOUS | Status: DC
  Administered 2022-03-22: 500 mg via INTRAVENOUS
  Filled 2022-03-22 (×2): qty 5

## 2022-03-22 MED ORDER — ATORVASTATIN CALCIUM 20 MG PO TABS: 40.0000 mg | ORAL_TABLET | Freq: Every evening | ORAL | Status: DC

## 2022-03-22 MED ORDER — SODIUM CHLORIDE 0.9 % IV SOLN
200.0000 mg | Freq: Once | INTRAVENOUS | Status: AC
  Administered 2022-03-22: 200 mg via INTRAVENOUS
  Filled 2022-03-22: qty 40

## 2022-03-22 MED ORDER — ACETAMINOPHEN 325 MG PO TABS: 650.0000 mg | ORAL_TABLET | Freq: Four times a day (QID) | ORAL | Status: DC | PRN

## 2022-03-22 MED ORDER — SODIUM CHLORIDE 0.9 % IV SOLN: 250.0000 mL | INTRAVENOUS | Status: DC | PRN

## 2022-03-22 MED ORDER — SODIUM CHLORIDE 0.9 % IV SOLN
2.0000 g | INTRAVENOUS | Status: DC
  Administered 2022-03-22: 2 g via INTRAVENOUS
  Filled 2022-03-22 (×2): qty 20

## 2022-03-22 MED ORDER — SODIUM CHLORIDE 0.9% FLUSH
3.0000 mL | Freq: Two times a day (BID) | INTRAVENOUS | Status: DC
  Administered 2022-03-22 – 2022-03-26 (×8): 3 mL via INTRAVENOUS

## 2022-03-22 MED ORDER — PREDNISONE 20 MG PO TABS
50.0000 mg | ORAL_TABLET | Freq: Every day | ORAL | Status: DC
  Administered 2022-03-25 – 2022-03-26 (×2): 50 mg via ORAL
  Filled 2022-03-22 (×2): qty 1

## 2022-03-22 MED ORDER — MULTI-VITAMIN/MINERALS PO TABS: 1.0000 | ORAL_TABLET | Freq: Every day | ORAL | Status: DC

## 2022-03-22 MED ORDER — PANTOPRAZOLE SODIUM 40 MG PO TBEC
40.0000 mg | DELAYED_RELEASE_TABLET | Freq: Every day | ORAL | Status: DC
  Administered 2022-03-23 – 2022-03-26 (×4): 40 mg via ORAL
  Filled 2022-03-22 (×4): qty 1

## 2022-03-22 MED ORDER — LOSARTAN POTASSIUM 50 MG PO TABS
100.0000 mg | ORAL_TABLET | Freq: Every evening | ORAL | Status: DC
  Administered 2022-03-22 – 2022-03-25 (×4): 100 mg via ORAL
  Filled 2022-03-22 (×4): qty 2

## 2022-03-22 MED ORDER — ACETAMINOPHEN 650 MG RE SUPP: 650.0000 mg | Freq: Four times a day (QID) | RECTAL | Status: DC | PRN

## 2022-03-22 MED ORDER — AMLODIPINE BESYLATE 5 MG PO TABS
10.0000 mg | ORAL_TABLET | Freq: Every day | ORAL | Status: DC
  Administered 2022-03-23 – 2022-03-26 (×3): 10 mg via ORAL
  Filled 2022-03-22 (×3): qty 2

## 2022-03-22 MED ORDER — CLOPIDOGREL BISULFATE 75 MG PO TABS
75.0000 mg | ORAL_TABLET | Freq: Every day | ORAL | Status: DC
  Administered 2022-03-23 – 2022-03-26 (×4): 75 mg via ORAL
  Filled 2022-03-22 (×4): qty 1

## 2022-03-22 MED ORDER — CALCIUM ACETATE (PHOS BINDER) 667 MG PO CAPS
1334.0000 mg | ORAL_CAPSULE | Freq: Three times a day (TID) | ORAL | Status: DC
  Administered 2022-03-23 – 2022-03-26 (×10): 1334 mg via ORAL
  Filled 2022-03-22 (×9): qty 2

## 2022-03-22 MED ORDER — SODIUM CHLORIDE 0.9% FLUSH: 3.0000 mL | INTRAVENOUS | Status: DC | PRN

## 2022-03-22 MED ORDER — ASPIRIN 81 MG PO TBEC
81.0000 mg | DELAYED_RELEASE_TABLET | Freq: Every day | ORAL | Status: DC
  Administered 2022-03-23 – 2022-03-26 (×4): 81 mg via ORAL
  Filled 2022-03-22 (×4): qty 1

## 2022-03-22 MED ORDER — VITAMIN D 25 MCG (1000 UNIT) PO TABS
5000.0000 [IU] | ORAL_TABLET | Freq: Every day | ORAL | Status: DC
  Administered 2022-03-22 – 2022-03-26 (×5): 5000 [IU] via ORAL
  Filled 2022-03-22 (×5): qty 5

## 2022-03-22 MED ORDER — ZINC SULFATE 220 (50 ZN) MG PO CAPS
220.0000 mg | ORAL_CAPSULE | Freq: Every day | ORAL | Status: DC
  Administered 2022-03-22 – 2022-03-26 (×5): 220 mg via ORAL
  Filled 2022-03-22 (×5): qty 1

## 2022-03-22 MED ORDER — ONDANSETRON HCL 4 MG PO TABS: 4.0000 mg | ORAL_TABLET | Freq: Four times a day (QID) | ORAL | Status: DC | PRN

## 2022-03-22 NOTE — Assessment & Plan Note (Addendum)
Status post recent non-ST elevation MI Continue metoprolol, aspirin, Plavix and statins

## 2022-03-22 NOTE — ED Notes (Signed)
Pt O2 sats 88%, placed on [place] at 2L. O2 came up to 92%. Increased to 3L O2 now at 96%.

## 2022-03-22 NOTE — Assessment & Plan Note (Signed)
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

## 2022-03-22 NOTE — Assessment & Plan Note (Addendum)
Started on Solu-Medrol and 3-day remdesivir course.  Change IV antibiotics over to [facility] and Zithromax for total of 5 days

## 2022-03-22 NOTE — ED Provider Notes (Signed)
[facility] Provider Note    Event Date/Time   First MD Initiated Contact with Patient 03/22/22 1141     (approximate)   History   Cough and Shortness of Breath   HPI  Joseph Mcpherson is a 76 y.o. 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  History is provided by the patient and his wife is at bedside.  States that he had a recent hospitalization for an NSTEMI and pneumonia and ultimately was able to be discharged home after a somewhat prolonged hospitalization.  States that he has been doing well at home but over the past couple of days started to get worse.  Endorses cough and congestion with shortness of breath.  Went to dialysis and completed a full session yesterday.  Denies any hemoptysis.  Feels like he cannot cough anything up.  Denies any nausea vomiting or diaphoresis.  Denies any chest pain at this time.      Physical Exam   Triage Vital Signs: ED Triage Vitals  Enc Vitals Group     BP 03/22/22 1125 (!) 160/64     Pulse Rate 03/22/22 1125 69     Resp 03/22/22 1125 18     Temp 03/22/22 1125 98.8 F (37.1 C)     Temp Source 03/22/22 1125 Oral     SpO2 03/22/22 1125 94 %     Weight --      Height --      Head Circumference --      Peak Flow --      Pain Score 03/22/22 1126 0     Pain Loc --      Pain Edu? --      Excl. in [place]? --     Most recent vital signs: Vitals:   03/22/22 1330 03/22/22 1400  BP: (!) 144/54 (!) 152/60  Pulse: 60 60  Resp: 18 19  Temp:    SpO2: 100% 100%    Physical Exam Constitutional:      Appearance: He is well-developed.  HENT:     Head: Atraumatic.  Eyes:     Conjunctiva/sclera: Conjunctivae normal.  Cardiovascular:     Rate and Rhythm: Regular rhythm.  Pulmonary:     Effort: Tachypnea and respiratory distress  present.     Comments: Coarse breath sound throughout all lung fields, no wheezing Abdominal:     Comments: Perc bili drain to the right upper quadrant with no underlying tenderness to palpation  Musculoskeletal:     Cervical back: Normal range of motion.  Skin:    General: Skin is warm.     Comments: Perm cath to the right upper chest wall  Neurological:     Mental Status: He is alert. Mental status is at baseline.          IMPRESSION / MDM / ASSESSMENT AND PLAN / ED COURSE  I reviewed the triage vital signs and the nursing notes.  Differential diagnosis including COVID, pneumonia, CHF exacerbation, ACS, pulmonary embolism.  On review of recent hospitalization and outside records patient had a recent hospitalization for an NSTEMI.  At that time patient's troponin was significantly elevated up to 2000.   EKG  EKG showed normal sinus rhythm.  T waves that are inverted in the inferior lateral leads.  Meets criteria for LVH.  No specific ST elevation.  No findings of hyperkalemia.  No tachycardic or bradycardic dysrhythmias while on cardiac telemetry  RADIOLOGY I independently reviewed imaging, my interpretation of imaging: Chest x-ray with cardiomegaly with trace pulmonary edema.  Read as pleural effusions      ED Results / Procedures / Treatments   Labs (all labs ordered are listed, but only abnormal results are displayed) Labs interpreted as -   Troponin is elevated but downtrending at 88.  COVID test came back positive.  Creatinine at his baseline.  No hyperkalemia.  Significantly elevated BNP.  Patient found to be hypoxic to 86% requiring 3 L nasal cannula.  Does have coarse breath sounds.  Does not make any urine.  Clinical picture is most consistent with acute hypoxic respiratory failure in the setting of COVID-pneumonia.  Given Decadron.  Labs Reviewed  RESP PANEL BY RT-PCR (FLU A&B, COVID) ARPGX2 - Abnormal; Notable for the following components:      Result  Value   SARS Coronavirus 2 by RT PCR POSITIVE (*)    All other components within normal limits  BASIC METABOLIC PANEL - Abnormal; Notable for the following components:   Glucose, Bld 138 (*)    Creatinine, Ser 6.28 (*)    Calcium 8.8 (*)    GFR, Estimated 9 (*)    All other components within normal limits  CBC - Abnormal; Notable for the following components:   WBC 13.4 (*)    RBC 3.92 (*)    Hemoglobin 10.3 (*)    HCT 33.8 (*)    RDW 18.0 (*)    All other components within normal limits  BRAIN NATRIURETIC PEPTIDE - Abnormal; Notable for the following components:   B Natriuretic Peptide >4,500.0 (*)    All other components within normal limits  TROPONIN I (HIGH SENSITIVITY) - Abnormal; Notable for the following components:   Troponin I (High Sensitivity) 89 (*)    All other components within normal limits  TROPONIN I (HIGH SENSITIVITY) - Abnormal; Notable for the following components:   Troponin I (High Sensitivity) 88 (*)    All other components within normal limits   Consulted the hospitalist and discussed with Dr. Francine Graven who recommended admission, evaluated in the emergency department.    PROCEDURES:  Critical Care performed: No  Procedures  Patient's presentation is most consistent with acute presentation with potential threat to life or bodily function.   MEDICATIONS ORDERED IN ED: Medications  dexamethasone (DECADRON) injection 10 mg (10 mg Intravenous Given 03/22/22 1402)    FINAL CLINICAL IMPRESSION(S) / ED DIAGNOSES   Final diagnoses:  Pneumonia due to COVID-19 virus  Hypoxic  Acute pulmonary edema ([place])     Rx / DC Orders   ED Discharge Orders     None        Note:  This document was prepared using Dragon voice recognition software and may include unintentional dictation errors.   Nathaniel Man, MD 03/22/22 1429

## 2022-03-22 NOTE — Assessment & Plan Note (Signed)
Continue Synthroid °

## 2022-03-22 NOTE — Assessment & Plan Note (Addendum)
Continue Levemir and sliding scale.  Sugars will be higher while on steroids.

## 2022-03-22 NOTE — Assessment & Plan Note (Signed)
Suspect possible bacterial pneumonia. Patient was recently treated for community-acquired pneumonia during his last hospitalization. He presents for evaluation of a cough and worsening shortness of breath and CT scan of the chest shows findings concerning for multifocal pneumonia We will obtain procalcitonin levels Send sputum for culture We will place patient empirically on Rocephin and Zithromax Obtain MRSA screen

## 2022-03-22 NOTE — Assessment & Plan Note (Signed)
Continue aspirin and atorvastatin. ?

## 2022-03-22 NOTE — ED Notes (Signed)
Advised nurse that patient has ready

## 2022-03-22 NOTE — ED Triage Notes (Signed)
Pt in via EMS from home with c/o SOB. EMS reports pt with wet cough, 95% on RA. Pt has nebulizer at home but has not used it this am. Cough is non productive. 173/89, MI 2 weeks ago, was seen here

## 2022-03-22 NOTE — Assessment & Plan Note (Signed)
Continue on amlodipine, metoprolol and losartan

## 2022-03-22 NOTE — H&P (Addendum)
History and Physical    Patient: Joseph Mcpherson HUT:[id number] DOB: 23-Feb-1946 DOA: 03/22/2022 DOS: the patient was seen and examined on 03/22/2022 PCP: [name], MD  Patient coming from: Home  Chief Complaint:  Chief Complaint  Patient presents with   Cough   Shortness of Breath   HPI: Joseph Mcpherson is a 76 y.o. male with medical history significant for insulin-dependent diabetes mellitus with complications of end-stage renal disease on hemodialysis (T/TH/S), diabetic neuropathy, GERD, hypothyroidism, acute cholecystitis status post cholecystostomy drain, coronary artery disease status post recent non-ST elevation MI on dual antiplatelet therapy, recent hospitalization for community-acquired pneumonia, chronic systolic heart failure with last known LVEF of 40 to 45% from a 2D echocardiogram which was done 03/10/22 who was recently discharged from the hospital on 03/13/22 after hospitalization for non-ST elevation MI, current acquired pneumonia and acute systolic heart failure. He presents to the ER for evaluation of cough and shortness of breath which started 1 day prior to his admission.  He has not missed any dialysis sessions.  He had room air pulse oximetry of 88% and is currently on 3 L of oxygen with pulse oximetry of 96%.  He has a wet sounding cough but is unable to expectorate any phlegm and denies having any fever or chills.  He denies having any sick contacts and denies having any headache, no chest pain, no nausea, no vomiting, no dizziness, no lightheadedness, no leg swelling, no changes in his bowel habits, no abdominal pain, no urinary symptoms, no focal deficit or blurred vision. His COVID-19 PCR test is positive CT scan of the chest without contrast shows moderate bilateral pleural effusions with associated dependent atelectasis. Peribronchial ground-glass attenuation in the lingula and to a lesser extent non dependent left lower lobe. More subtle airspace opacities are present in  the right upper lobe. Findings are concerning for multifocal pneumonia. Cholecystostomy tube noted. Coronary artery disease. Right IJ dialysis catheter terminates in the right atrium. Aortic Atherosclerosis     Review of Systems: As mentioned in the history of present illness. All other systems reviewed and are negative. Past Medical History:  Diagnosis Date   Clotting disorder ([place])    per daughter, "when patient travels, his feet swell"   Diabetes mellitus without complication ([place])    type II   Diabetic eye exam ([place]) 12/2019   ESRD (end stage renal disease) ([place])    MWF- [place]   GERD (gastroesophageal reflux disease)    [name] (hard of hearing)    Hyperlipidemia    Hypertension    Hypothyroidism    Thyroid disease    Past Surgical History:  Procedure Laterality Date   AV FISTULA PLACEMENT Right 10/22/2017   Procedure: ARTERIOVENOUS (AV) BRACHIOCEPHALIC FISTULA CREATION RIGHT UPPER ARM;  Surgeon: Conrad Nageezi, MD;  Location: [facility];  Service: Vascular;  Laterality: Right;   AV FISTULA PLACEMENT Left 02/03/2019   Procedure: INSERTION OF ARTERIOVENOUS (AV) GORE-TEX GRAFT ARM (BRACHIAL AXILLARY );  Surgeon: [name], MD;  Location: [facility];  Service: Vascular;  Laterality: Left;   AV FISTULA PLACEMENT Left 12/10/2020   Procedure: LEFT ARM BRACHIOCEPHALIC ARTERIOVENOUS (AV) FISTULA CREATION;  Surgeon: [name], MD;  Location: [facility];  Service: Vascular;  Laterality: Left;   IR CHOLANGIOGRAM EXISTING TUBE  03/18/2022   IR PERC CHOLECYSTOSTOMY  01/21/2022   UPPER EXTREMITY ANGIOGRAPHY Left 09/19/2019   Procedure: UPPER EXTREMITY ANGIOGRAPHY;  Surgeon: [name], MD;  Location: [facility];  Service: Cardiovascular;  Laterality: Left;   Social History:  reports that he has never smoked. His smokeless tobacco use includes snuff. He reports that he does not drink alcohol and does not use drugs.  No Known Allergies  Family History  Problem Relation  Age of Onset   Diabetes Mother    Hyperlipidemia Mother    Hypertension Father     Prior to Admission medications   Medication Sig Start Date End Date Taking? Authorizing Provider  albuterol (PROVENTIL) (2.5 MG/3ML) 0.083% nebulizer solution Take 3 mLs (2.5 mg total) by nebulization every 6 (six) hours as needed for wheezing or shortness of breath. 03/13/22   Lorella Nimrod, MD  amLODipine (NORVASC) 10 MG tablet Take 1 tablet (10 mg total) by mouth daily. 02/02/22   Fritzi Mandes, MD  aspirin EC 81 MG tablet Take 81 mg by mouth daily. Swallow whole.    [provider]  atorvastatin (LIPITOR) 40 MG tablet Take 1 tablet (40 mg total) by mouth daily. 03/14/22   Lorella Nimrod, MD  B Complex-C-Folic Acid (RENA-VITE RX) 1 MG TABS Take 1 tablet by mouth daily.     [provider]  BD PEN NEEDLE NANO 2ND GEN 32G X 4 MM MISC USE AS DIRECTED 02/26/22   [name], MD  calcium acetate (PHOSLO) 667 MG capsule TAKE 2 CAPSULES BY MOUTH 3 TIMES A DAY WITH MEAL 02/16/22   [name], MD  chlorproMAZINE (THORAZINE) 25 MG tablet TAKE 1 TABLET BY MOUTH THREE TIMES A DAY 02/27/22   [name], MD  Cholecalciferol (VITAMIN D3) 5000 units TABS Take 5,000 Units by mouth daily.     [provider]  clopidogrel (PLAVIX) 75 MG tablet Take 1 tablet (75 mg total) by mouth daily. 03/14/22   Lorella Nimrod, MD  Continuous Blood Gluc Sensor (FREESTYLE LIBRE 14 DAY SENSOR) MISC APPLY EVERY 14 (FOURTEEN) DAYS. 02/02/22   [name], MD  donepezil (ARICEPT) 10 MG tablet TAKE 1 TABLET BY MOUTH EVERY DAY 04/22/21   [name], MD  gabapentin (NEURONTIN) 300 MG capsule TAKE 1 CAPSULE BY MOUTH EVERY DAY AT BEDTIME AS DIRECTED 11/05/21   [name], MD  insulin detemir (LEVEMIR) 100 UNIT/ML injection Inject 0.15 mLs (15 Units total) into the skin at bedtime. 02/02/22   Fritzi Mandes, MD  ipratropium-albuterol (DUONEB) 0.5-2.5 (3) MG/3ML SOLN Take 3 mLs by nebulization every 6 (six) hours as needed.  03/05/22   [name], MD  levothyroxine (SYNTHROID) 137 MCG tablet TAKE 1 TABLET BY MOUTH EVERY DAY 03/24/21   [name], MD  losartan (COZAAR) 100 MG tablet Take 1 tablet (100 mg total) by mouth every evening. 02/02/22   Fritzi Mandes, MD  metoprolol succinate (TOPROL-XL) 50 MG 24 hr tablet TAKE 1 TABLET BY MOUTH EVERY DAY 01/23/22   [name], MD  Multiple Vitamins-Minerals (MULTIVITAMIN WITH MINERALS) tablet Take 1 tablet by mouth daily.    [provider]  pantoprazole (PROTONIX) 40 MG tablet TAKE 1 TABLET BY MOUTH EVERY DAY 07/09/21   [name], MD  sevelamer carbonate (RENVELA) 2.4 g PACK Take 2.4 g by mouth daily. Patient not taking: Reported on 03/10/2022 02/25/22   [name], MD  sitaGLIPtin (JANUVIA) 50 MG tablet Take 50 mg by mouth every evening.     [provider]  sodium chloride flush (NS) 0.9 % SOLN Inject 5 mLs into the vein daily. Flush cholecystostomy tube daily with 5 ccs of NS 01/22/22 03/23/22  [name], PA-C    Physical Exam: Vitals:  03/22/22 1330 03/22/22 1400 03/22/22 1430 03/22/22 1612  BP: (!) 144/54 (!) 152/60 (!) 151/61   Pulse: 60 60 62   Resp: '18 19 19   '$ Temp:    97.7 F (36.5 C)  TempSrc:    Oral  SpO2: 100% 100% 97%    Physical Exam Vitals and nursing note reviewed.  Constitutional:      Comments: Acutely ill-appearing, weak  HENT:     Head: Normocephalic and atraumatic.     Mouth/Throat:     Mouth: Mucous membranes are moist.  Eyes:     Comments: Pale conjunctiva  Cardiovascular:     Rate and Rhythm: Normal rate and regular rhythm.  Pulmonary:     Comments: Scattered rhonchi and wheezes in both lung fields Abdominal:     General: Bowel sounds are normal.     Palpations: Abdomen is soft.     Comments: Cholecystostomy tube in place  Musculoskeletal:        General: Normal range of motion.     Cervical back: Normal range of motion and neck supple.  Skin:    General: Skin is warm and dry.   Neurological:     Mental Status: He is alert.     Motor: Weakness present.  Psychiatric:        Mood and Affect: Mood normal.        Behavior: Behavior normal.     Data Reviewed: Relevant notes from primary care and specialist visits, past discharge summaries as available in EHR, including Care Everywhere. Prior diagnostic testing as pertinent to current admission diagnoses Updated medications and problem lists for reconciliation ED course, including vitals, labs, imaging, treatment and response to treatment Triage notes, nursing and pharmacy notes and ED provider's notes Notable results as noted in HPI Labs reviewed.  Troponin 89 >> 88, BNP 4500, sodium 135, potassium 4.5, chloride 98, bicarb 27, glucose 138, BUN 17, creatinine 6.28, calcium 8.8, white count 13.4, hemoglobin 10.3, hematocrit 33.8, RDW 18, platelet count 304 SARS coronavirus 2 PCR is positive Chest x-ray reviewed by me shows small left pleural effusion with associated atelectasis.Right internal jugular central venous catheter tip overlies the right atrium. Twelve-lead EKG reviewed by me shows normal sinus rhythm.  ST and T wave changes in the inferior lateral walls. There are no new results to review at this time.  Assessment and Plan: * Pneumonia due to COVID-19 virus Patient presents to the emergency room for evaluation of a wet sounding cough which is nonproductive and worsening shortness of breath. His COVID-19 PCR test is positive Per patient's wife he is vaccinated against the COVID-19 virus We will treat patient with remdesivir per protocol  Start patient on systemic steroids due to hypoxia Supportive care with bronchodilator therapy, antitussives and vitamin Continue oxygen supplementation to maintain pulse oximetry greater than 92%  Acute respiratory failure with hypoxia ([place]) Appears to be multifactorial and related to COVID-pneumonia, possible bacterial pneumonia and acute on chronic systolic heart  failure. Patient had room air pulse oximetry of 88% and is currently on 3 L of oxygen to maintain pulse oximetry greater than 92% We will obtain procalcitonin levels and if negative will discontinue antibiotics Nephrology consult for renal replacement therapy in in a.m. to optimize volume status We will attempt to wean patient off oxygen once acute illness improves or resolves  End stage renal disease ([place]) Patient has end-stage renal disease as a complication of diabetes mellitus and hypertension. Dialysis days are T/TH/S We will request nephrology consult  for renal replacement therapy  Essential hypertension Blood pressure stable on amlodipine, metoprolol and losartan  Insulin dependent type 2 diabetes mellitus (HCC) Continue long-acting insulin with sliding scale coverage Expect hyperglycemia since patient will be started on systemic steroids  Anemia in chronic kidney disease H&H is stable Monitor closely during this hospitalization  Hypothyroidism Continue Synthroid  CAD (coronary artery disease) Status post recent non-ST elevation MI Continue metoprolol, aspirin, Plavix and statins  Multifocal pneumonia Suspect possible bacterial pneumonia. Patient was recently treated for community-acquired pneumonia during his last hospitalization. 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 Continue Aricept  Acute clinical systolic heart failure [facility]) Patient presents for evaluation of worsening shortness of breath from his baseline Patient has ischemic cardiomyopathy with his last 2D echocardiogram showing an LVEF of 40 to 45% Optimize blood pressure  control Renal replacement therapy to optimize volume status  Cholecystitis, acute Patient was found to have acute cholecystitis during his last hospitalization and has a cholecystostomy drain in place We will consult general surgery for management during this hospitalization  History of CVA (cerebrovascular accident) Continue aspirin and atorvastatin      Advance Care Planning:   Code Status: Full Code   Consults: Nephrology, surgery  Family Communication: Greater than 50% of time was spent discussing patient's condition and plan of care with his wife over the phone.  All questions and concerns have been addressed.  She verbalizes understanding and agrees with the plan.  Severity of Illness: The appropriate patient status for this patient is INPATIENT. Inpatient status is judged to be reasonable and necessary in order to provide the required intensity of service to ensure the patient's safety. The patient's presenting symptoms, physical exam findings, and initial radiographic and laboratory data in the context of their chronic comorbidities is felt to place them at high risk for further clinical deterioration. Furthermore, it is not anticipated that the patient will be medically stable for discharge from the hospital within 2 midnights of admission.   * I certify that at the point of admission it is my clinical judgment that the patient will require inpatient hospital care spanning beyond 2 midnights from the point of admission due to high intensity of service, high risk for further deterioration and high frequency of surveillance required.*  Author: Collier Bullock, MD 03/22/2022 4:13 PM  For on call review [email or web address].

## 2022-03-22 NOTE — Assessment & Plan Note (Addendum)
Patient has a cholecystectomy drain and will have it clamped at this point for a trial.  Case discussed with general surgery team.

## 2022-03-22 NOTE — Assessment & Plan Note (Addendum)
last 2D echocardiogram showing an LVEF of 40 to 45% Dialysis to manage fluid.

## 2022-03-22 NOTE — ED Notes (Signed)
Dr. Jori Moll, MD at bedside at this time for evaluation.

## 2022-03-22 NOTE — Assessment & Plan Note (Signed)
Patient had room air pulse oximetry of 88% and is currently on 3 L of oxygen to maintain pulse oximetry greater than 92%.  Check pulse ox on room air tomorrow morning.

## 2022-03-22 NOTE — Assessment & Plan Note (Signed)
Last hemoglobin 9.9

## 2022-03-22 NOTE — ED Triage Notes (Signed)
Pt with SHOB and cough from home.  Sats 94-95% on RA.  Dialysis tu, thur, and sat.  Has not missed any . Recent admission for NSTEMI discharged on 03/13/22.

## 2022-03-22 NOTE — ED Notes (Signed)
Dr Agbata at bedside. 

## 2022-03-22 NOTE — Assessment & Plan Note (Signed)
Patient has end-stage renal disease as a complication of diabetes mellitus and hypertension. Dialysis days are T/TH/S We will request nephrology consult for renal replacement therapy

## 2022-03-23 ENCOUNTER — Encounter (INDEPENDENT_AMBULATORY_CARE_PROVIDER_SITE_OTHER): Payer: Self-pay

## 2022-03-23 DIAGNOSIS — K810 Acute cholecystitis: Secondary | ICD-10-CM | POA: Diagnosis not present

## 2022-03-23 DIAGNOSIS — F01A0 Vascular dementia, mild, without behavioral disturbance, psychotic disturbance, mood disturbance, and anxiety: Secondary | ICD-10-CM

## 2022-03-23 DIAGNOSIS — J189 Pneumonia, unspecified organism: Secondary | ICD-10-CM | POA: Diagnosis not present

## 2022-03-23 DIAGNOSIS — U071 COVID-19: Secondary | ICD-10-CM | POA: Diagnosis not present

## 2022-03-23 DIAGNOSIS — Z794 Long term (current) use of insulin: Secondary | ICD-10-CM

## 2022-03-23 DIAGNOSIS — D631 Anemia in chronic kidney disease: Secondary | ICD-10-CM

## 2022-03-23 DIAGNOSIS — J9601 Acute respiratory failure with hypoxia: Secondary | ICD-10-CM | POA: Diagnosis not present

## 2022-03-23 DIAGNOSIS — E875 Hyperkalemia: Secondary | ICD-10-CM

## 2022-03-23 DIAGNOSIS — I5021 Acute systolic (congestive) heart failure: Secondary | ICD-10-CM

## 2022-03-23 DIAGNOSIS — N186 End stage renal disease: Secondary | ICD-10-CM | POA: Diagnosis not present

## 2022-03-23 DIAGNOSIS — Z992 Dependence on renal dialysis: Secondary | ICD-10-CM

## 2022-03-23 DIAGNOSIS — E119 Type 2 diabetes mellitus without complications: Secondary | ICD-10-CM

## 2022-03-23 DIAGNOSIS — Z8673 Personal history of transient ischemic attack (TIA), and cerebral infarction without residual deficits: Secondary | ICD-10-CM

## 2022-03-23 LAB — CBC
HCT: 32.5 % — ABNORMAL LOW (ref 39.0–52.0)
Hemoglobin: 9.9 g/dL — ABNORMAL LOW (ref 13.0–17.0)
MCH: 26.6 pg (ref 26.0–34.0)
MCHC: 30.5 g/dL (ref 30.0–36.0)
MCV: 87.4 fL (ref 80.0–100.0)
Platelets: 249 10*3/uL (ref 150–400)
RBC: 3.72 MIL/uL — ABNORMAL LOW (ref 4.22–5.81)
RDW: 17.5 % — ABNORMAL HIGH (ref 11.5–15.5)
WBC: 6.6 10*3/uL (ref 4.0–10.5)
nRBC: 0 % (ref 0.0–0.2)

## 2022-03-23 LAB — GLUCOSE, CAPILLARY
Glucose-Capillary: 174 mg/dL — ABNORMAL HIGH (ref 70–99)
Glucose-Capillary: 259 mg/dL — ABNORMAL HIGH (ref 70–99)
Glucose-Capillary: 200 mg/dL — ABNORMAL HIGH (ref 70–99)
Glucose-Capillary: 322 mg/dL — ABNORMAL HIGH (ref 70–99)

## 2022-03-23 LAB — COMPREHENSIVE METABOLIC PANEL
ALT: 21 U/L (ref 0–44)
AST: 32 U/L (ref 15–41)
Albumin: 2.9 g/dL — ABNORMAL LOW (ref 3.5–5.0)
Alkaline Phosphatase: 96 U/L (ref 38–126)
Anion gap: 13 (ref 5–15)
BUN: 27 mg/dL — ABNORMAL HIGH (ref 8–23)
CO2: 26 mmol/L (ref 22–32)
Calcium: 8.6 mg/dL — ABNORMAL LOW (ref 8.9–10.3)
Chloride: 99 mmol/L (ref 98–111)
Creatinine, Ser: 7.34 mg/dL — ABNORMAL HIGH (ref 0.61–1.24)
GFR, Estimated: 7 mL/min — ABNORMAL LOW (ref >60)
Glucose, Bld: 191 mg/dL — ABNORMAL HIGH (ref 70–99)
Potassium: 5.4 mmol/L — ABNORMAL HIGH (ref 3.5–5.1)
Sodium: 138 mmol/L (ref 135–145)
Total Bilirubin: 0.8 mg/dL (ref 0.3–1.2)
Total Protein: 6.4 g/dL — ABNORMAL LOW (ref 6.5–8.1)

## 2022-03-23 LAB — HEPATITIS B SURFACE ANTIGEN: Hepatitis B Surface Ag: NONREACTIVE

## 2022-03-23 MED ORDER — AZITHROMYCIN 250 MG PO TABS
250.0000 mg | ORAL_TABLET | Freq: Every day | ORAL | Status: AC
  Administered 2022-03-23 – 2022-03-26 (×4): 250 mg via ORAL
  Filled 2022-03-23 (×4): qty 1

## 2022-03-23 MED ORDER — LIDOCAINE HCL (PF) 1 % IJ SOLN: 5.0000 mL | INTRAMUSCULAR | Status: DC | PRN

## 2022-03-23 MED ORDER — ALTEPLASE 2 MG IJ SOLR: 2.0000 mg | Freq: Once | INTRAMUSCULAR | Status: DC | PRN

## 2022-03-23 MED ORDER — HEPARIN SODIUM (PORCINE) 1000 UNIT/ML DIALYSIS: 1000.0000 [IU] | INTRAMUSCULAR | Status: DC | PRN

## 2022-03-23 MED ORDER — CHLORHEXIDINE GLUCONATE CLOTH 2 % EX PADS
6.0000 | MEDICATED_PAD | Freq: Every day | CUTANEOUS | Status: DC
  Administered 2022-03-25 – 2022-03-26 (×2): 6 via TOPICAL

## 2022-03-23 MED ORDER — SODIUM ZIRCONIUM CYCLOSILICATE 10 G PO PACK
10.0000 g | PACK | Freq: Every day | ORAL | Status: DC
  Administered 2022-03-23 – 2022-03-25 (×3): 10 g via ORAL
  Filled 2022-03-23 (×3): qty 1

## 2022-03-23 MED ORDER — LIDOCAINE-PRILOCAINE 2.5-2.5 % EX CREA: 1.0000 | TOPICAL_CREAM | CUTANEOUS | Status: DC | PRN

## 2022-03-23 MED ORDER — ANTICOAGULANT SODIUM CITRATE 4% (200MG/5ML) IV SOLN: 5.0000 mL | Status: DC | PRN

## 2022-03-23 MED ORDER — CEFDINIR 300 MG PO CAPS
300.0000 mg | ORAL_CAPSULE | Freq: Every day | ORAL | Status: AC
  Administered 2022-03-23 – 2022-03-26 (×4): 300 mg via ORAL
  Filled 2022-03-23 (×4): qty 1

## 2022-03-23 MED ORDER — PENTAFLUOROPROP-TETRAFLUOROETH EX AERO: 1.0000 | INHALATION_SPRAY | CUTANEOUS | Status: DC | PRN

## 2022-03-23 NOTE — Consult Note (Signed)
   [facility] CM Inpatient Consult   03/23/2022  Lance Huaracha 07/27/1945 [id number]  [facility] [ACO] Patient: Joseph Mcpherson HMO  Primary Care Provider:  [name], MD with [facility] listed for the [facility] follow up.  [facility] Liaison remote coverage review for patient at [facility]  Patient screened for  less than readmission hospitalization with noted extreme high risk score for unplanned readmission risk.   Review of patient's medical record reveals patient has had out reach encounters with [facility] for transition of care follow up since previous admission.  Patient admitted with ongoing cough with Shortness of Breath with COVID-19 pneumonia noted. Reviewed MD progress notes and [facility] RN [facility] notes for community engagement.  Plan:  Continue to follow progress and disposition to assess for post hospital care management needs.    For questions contact:    Natividad Brood, RN BSN [place]  [phone] business mobile phone Toll free office [phone]  *[place]  [phone] Fax number: [phone] [email or web address]       .

## 2022-03-23 NOTE — Consult Note (Signed)
[facility] SURGICAL CONSULTATION NOTE (initial) - cpt: 16606   HISTORY OF PRESENT ILLNESS (HPI):  76 y.o. male well known to our service for cholecystitis being managed with percutaneous cholecystostomy tube. He was last seen in the office on 10/27 and was offered clamping trial after repeat cholangiogram on 10/25 with reassuring and showed patency of cystic duct. He was referred back to IR for capping. Unclear if this happened but wife stated "they capped it." There is no drainage tubing currently and the end is wrapped in gauze, baggies, and tape. Unfortunately, it appears he presented to [facility] on 10/29 for SOB. He was found to have COVID PNA and admitted to the medicine service.   Surgery is consulted by hospitalist physician Dr. Francine Graven in this context for evaluation and management of percutaneous cholecystostomy tube.  PAST MEDICAL HISTORY (PMH):  Past Medical History:  Diagnosis Date   Clotting disorder ([place])    per daughter, "when patient travels, his feet swell"   Diabetes mellitus without complication ([place])    type II   Diabetic eye exam ([place]) 12/2019   ESRD (end stage renal disease) ([place])    MWF- [place]   GERD (gastroesophageal reflux disease)    [name] (hard of hearing)    Hyperlipidemia    Hypertension    Hypothyroidism    Thyroid disease      PAST SURGICAL HISTORY ([place]):  Past Surgical History:  Procedure Laterality Date   AV FISTULA PLACEMENT Right 10/22/2017   Procedure: ARTERIOVENOUS (AV) BRACHIOCEPHALIC FISTULA CREATION RIGHT UPPER ARM;  Surgeon: Conrad Peck, MD;  Location: [place];  Service: Vascular;  Laterality: Right;   AV FISTULA PLACEMENT Left 02/03/2019   Procedure: INSERTION OF ARTERIOVENOUS (AV) GORE-TEX GRAFT ARM (BRACHIAL AXILLARY );  Surgeon: [name], MD;  Location: [facility];  Service: Vascular;  Laterality: Left;   AV FISTULA PLACEMENT Left 12/10/2020   Procedure: LEFT ARM BRACHIOCEPHALIC ARTERIOVENOUS (AV) FISTULA CREATION;   Surgeon: [name], MD;  Location: [place];  Service: Vascular;  Laterality: Left;   IR CHOLANGIOGRAM EXISTING TUBE  03/18/2022   IR PERC CHOLECYSTOSTOMY  01/21/2022   UPPER EXTREMITY ANGIOGRAPHY Left 09/19/2019   Procedure: UPPER EXTREMITY ANGIOGRAPHY;  Surgeon: [name], MD;  Location: [place] [facility];  Service: Cardiovascular;  Laterality: Left;     MEDICATIONS:  Prior to Admission medications   Medication Sig Start Date End Date Taking? Authorizing Provider  albuterol (PROVENTIL) (2.5 MG/3ML) 0.083% nebulizer solution Take 3 mLs (2.5 mg total) by nebulization every 6 (six) hours as needed for wheezing or shortness of breath. 03/13/22  Yes Lorella Nimrod, MD  amLODipine (NORVASC) 10 MG tablet Take 1 tablet (10 mg total) by mouth daily. 02/02/22  Yes Fritzi Mandes, MD  aspirin EC 81 MG tablet Take 81 mg by mouth daily. Swallow whole.   Yes [provider]  atorvastatin (LIPITOR) 40 MG tablet Take 1 tablet (40 mg total) by mouth daily. 03/14/22  Yes Lorella Nimrod, MD  B Complex-C-Folic Acid (RENA-VITE RX) 1 MG TABS Take 1 tablet by mouth daily.    Yes [provider]  calcium acetate (PHOSLO) 667 MG capsule TAKE 2 CAPSULES BY MOUTH 3 TIMES A DAY WITH MEAL 02/16/22  Yes Masoud, Viann Shove, MD  chlorproMAZINE (THORAZINE) 25 MG tablet TAKE 1 TABLET BY MOUTH THREE TIMES A DAY 02/27/22  Yes Masoud, Viann Shove, MD  Cholecalciferol (VITAMIN D3) 5000 units TABS Take 5,000 Units by mouth daily.    Yes [provider]  clopidogrel (PLAVIX) 75 MG tablet Take 1 tablet (75 mg total) by mouth daily. 03/14/22  Yes Lorella Nimrod, MD  donepezil (ARICEPT) 10 MG tablet TAKE 1 TABLET BY MOUTH EVERY DAY 04/22/21  Yes Masoud, Viann Shove, MD  gabapentin (NEURONTIN) 300 MG capsule TAKE 1 CAPSULE BY MOUTH EVERY DAY AT BEDTIME AS DIRECTED 11/05/21  Yes Masoud, Viann Shove, MD  insulin detemir (LEVEMIR) 100 UNIT/ML injection Inject 0.15 mLs (15 Units total) into the skin at bedtime. 02/02/22  Yes Fritzi Mandes, MD  levothyroxine (SYNTHROID) 137 MCG tablet TAKE 1 TABLET BY MOUTH EVERY DAY 03/24/21  Yes Masoud, Viann Shove, MD  losartan (COZAAR) 100 MG tablet Take 1 tablet (100 mg total) by mouth every evening. 02/02/22  Yes Fritzi Mandes, MD  metoprolol succinate (TOPROL-XL) 50 MG 24 hr tablet TAKE 1 TABLET BY MOUTH EVERY DAY 01/23/22  Yes Masoud, Viann Shove, MD  pantoprazole (PROTONIX) 40 MG tablet TAKE 1 TABLET BY MOUTH EVERY DAY 07/09/21  Yes Masoud, Viann Shove, MD  sevelamer carbonate (RENVELA) 2.4 g PACK Take 2.4 g by mouth daily. 02/25/22  Yes Masoud, Viann Shove, MD  sitaGLIPtin (JANUVIA) 50 MG tablet Take 50 mg by mouth every evening.    Yes [provider]  BD PEN NEEDLE NANO 2ND GEN 32G X 4 MM MISC USE AS DIRECTED 02/26/22   [name], MD  Continuous Blood Gluc Sensor (FREESTYLE LIBRE 14 DAY SENSOR) MISC APPLY EVERY 14 (FOURTEEN) DAYS. 02/02/22   [name], MD  ipratropium-albuterol (DUONEB) 0.5-2.5 (3) MG/3ML SOLN Take 3 mLs by nebulization every 6 (six) hours as needed. 03/05/22   [name], MD  Multiple Vitamins-Minerals (MULTIVITAMIN WITH MINERALS) tablet Take 1 tablet by mouth daily. Patient not taking: Reported on 03/22/2022    [provider]  sodium chloride flush (NS) 0.9 % SOLN Inject 5 mLs into the vein daily. Flush cholecystostomy tube daily with 5 ccs of NS 01/22/22 03/23/22  [name], PA-C     ALLERGIES:  No Known Allergies   SOCIAL HISTORY:  Social History   Socioeconomic History   Marital status: Married    Spouse name: Not on file   Number of children: Not on file   Years of education: 12   Highest education level: 12th grade  Occupational History   Occupation: Retired  Tobacco Use   Smoking status: Never   Smokeless tobacco: Current    Types: Snuff  Vaping Use   Vaping Use: Never used  Substance and Sexual Activity   Alcohol use: No   Drug use: No   Sexual activity: Not Currently  Other Topics Concern   Not on file  Social History Narrative    Not on file   Social Determinants of Health   Financial Resource Strain: Low Risk  (06/19/2021)   Overall Financial Resource Strain (CARDIA)    Difficulty of Paying Living Expenses: Not hard at all  Food Insecurity: No Food Insecurity (03/22/2022)   Hunger Vital Sign    Worried About Running Out of Food in the Last Year: Never true    [place] in the Last Year: Never true  Transportation Needs: No Transportation Needs (03/22/2022)   PRAPARE - Hydrologist (Medical): No    Lack of Transportation (Non-Medical): No  Physical Activity: Sufficiently Active (06/19/2021)   Exercise Vital Sign    Days of Exercise per Week: 7 days    Minutes of Exercise per Session: 30 min  Stress: No Stress Concern Present (  06/19/2021)   [place]    Feeling of Stress : Not at all  Social Connections: [place] (06/19/2021)   Social Connection and Isolation Panel [NHANES]    Frequency of Communication with Friends and Family: More than three times a week    Frequency of Social Gatherings with Friends and Family: More than three times a week    Attends Religious Services: More than 4 times per year    Active Member of [employer] or Organizations: Yes    Attends Music therapist: More than 4 times per year    Marital Status: Married  Human resources officer Violence: Not At Risk (03/22/2022)   Humiliation, Afraid, Rape, and Kick questionnaire    Fear of Current or Ex-Partner: No    Emotionally Abused: No    Physically Abused: No    Sexually Abused: No     FAMILY HISTORY:  Family History  Problem Relation Age of Onset   Diabetes Mother    Hyperlipidemia Mother    Hypertension Father       REVIEW OF SYSTEMS:  Review of Systems  Constitutional:  Negative for chills and fever.  Respiratory:  Positive for cough and shortness of breath.   Cardiovascular:  Negative for chest pain and  palpitations.  Gastrointestinal:  Negative for abdominal pain, nausea and vomiting.  All other systems reviewed and are negative.   VITAL SIGNS:  Temp:  [97.4 F (36.3 C)-98.8 F (37.1 C)] 97.4 F (36.3 C) (10/30 0802) Pulse Rate:  [55-69] 59 (10/30 0802) Resp:  [16-20] 16 (10/30 0802) BP: (140-162)/(54-84) 158/68 (10/30 0802) SpO2:  [94 %-100 %] 100 % (10/30 0802) Weight:  [65.8 kg] 65.8 kg (10/29 1646)     Height: '5\' 4"'$  (162.6 cm) Weight: 65.8 kg BMI (Calculated): 24.88   INTAKE/OUTPUT:  No intake/output data recorded.  PHYSICAL EXAM:  Physical Exam Vitals and nursing note reviewed.  Constitutional:      General: He is not in acute distress.    Appearance: Normal appearance. He is normal weight. He is not ill-appearing.  HENT:     Head: Normocephalic and atraumatic.  Eyes:     General: No scleral icterus.    Conjunctiva/sclera: Conjunctivae normal.  Abdominal:     General: Abdomen is flat. There is no distension.     Tenderness: There is no abdominal tenderness. There is no guarding or rebound.     Comments: Abdomen is soft, percutaneous cholecystostomy tube in RUQ, this no longer has drainage tube and wife states it has been capped. The end is wrapped in a significant amount of gauze and tape and is incredibly challenging to evaluate.   Genitourinary:    Comments: Deferred Skin:    General: Skin is warm and dry.  Neurological:     General: No focal deficit present.     Mental Status: He is alert and oriented to person, place, and time.  Psychiatric:        Behavior: Behavior normal.      Labs:     Latest Ref Rng & Units 03/23/2022    4:38 AM 03/22/2022   11:32 AM 03/13/2022    5:51 AM  CBC  WBC 4.0 - 10.5 K/uL 6.6  13.4    Hemoglobin 13.0 - 17.0 g/dL 9.9  10.3  9.2   Hematocrit 39.0 - 52.0 % 32.5  33.8  30.3   Platelets 150 - 400 K/uL 249  304  Latest Ref Rng & Units 03/23/2022    4:38 AM 03/22/2022   11:32 AM 03/12/2022    5:37 AM  CMP   Glucose 70 - 99 mg/dL 191  138  187   BUN 8 - 23 mg/dL '27  17  29   '$ Creatinine 0.61 - 1.24 mg/dL 7.34  6.28  8.16   Sodium 135 - 145 mmol/L 138  135  133   Potassium 3.5 - 5.1 mmol/L 5.4  4.5  4.3   Chloride 98 - 111 mmol/L 99  98  101   CO2 22 - 32 mmol/L '26  27  25   '$ Calcium 8.9 - 10.3 mg/dL 8.6  8.8  8.5   Total Protein 6.5 - 8.1 g/dL 6.4     Total Bilirubin 0.3 - 1.2 mg/dL 0.8     Alkaline Phos 38 - 126 U/L 96     AST 15 - 41 U/L 32     ALT 0 - 44 U/L 21        Imaging studies:  No new pertinent imaging studies   Assessment/Plan: (ICD-10's: K81.0) 76 y.o. male admitted with COVID PNA, with known history of cholecystitis and percutaneous cholecystostomy tube, now capped.    - Okay to continue with capping trial of percutaneous cholecystostomy tube. I left orders to flush drain daily with NS. If he develops RUQ pain at any time, we will need to return this to drainage.    - We will continue to follow up outpatient to determine ife, and when, cholecystectomy is feasible.    - Please call with any questions/concerns   All of the above findings and recommendations were discussed with the patient and his family, and all of their questions were answered to their expressed satisfaction.  Thank you for the opportunity to participate in this patient's care.   -- Edison Simon, PA-C [facility] 03/23/2022, 9:47 AM M-F: 7am - 4pm

## 2022-03-23 NOTE — Progress Notes (Signed)
Progress Note   Patient: Joseph Mcpherson BZJ:[id number] DOB: 12/13/1945 DOA: 03/22/2022     1 DOS: the patient was seen and examined on 03/23/2022     Assessment and Plan: * Pneumonia due to COVID-19 virus Started on Solu-Medrol and 3-day remdesivir course.  Change IV antibiotics over to [facility] and Zithromax for total of 5 days  Multifocal pneumonia Change antibiotics over to oral.  Suspect more likely COVID-related.  Acute respiratory failure with hypoxia [facility]) Patient had room air pulse oximetry of 88% and is currently on 3 L of oxygen to maintain pulse oximetry greater than 92%   End stage renal disease (HCC) Continue dialysis Tuesday Thursday and Saturday  Hyperkalemia We will give a dose of Lokelma  Essential hypertension Continue on amlodipine, metoprolol and losartan  Insulin dependent type 2 diabetes mellitus (HCC) Continue Levemir and sliding scale.  Sugars will be higher while on steroids.  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  Cholecystitis, acute Patient has a cholecystectomy drain and will have it clamped at this point for a trial.  Case discussed with general surgery team.  History of CVA (cerebrovascular accident) Continue aspirin and atorvastatin        Subjective: Patient feels okay.  Little bit of shortness of breath.  Dry cough.  Admitted with COVID-19 pneumonia  Physical Exam: Vitals:   03/22/22 1646 03/22/22 1919 03/23/22 0401 03/23/22 0802  BP:  (!) 146/68 (!) 148/63 (!) 158/68  Pulse:  64 (!) 55 (!) 59  Resp:  '20 18 16  '$ Temp:  98.2 F (36.8  C) 98 F (36.7 C) (!) 97.4 F (36.3 C)  TempSrc:  Oral Axillary Oral  SpO2:  97% 100% 100%  Weight: 65.8 kg     Height: '5\' 4"'$  (1.626 m)      Physical Exam HENT:     Head: Normocephalic.     Mouth/Throat:     Pharynx: No oropharyngeal exudate.  Eyes:     General: Lids are normal.     Conjunctiva/sclera: Conjunctivae normal.  Cardiovascular:     Rate and Rhythm: Normal rate and regular rhythm.     Heart sounds: Normal heart sounds, S1 normal and S2 normal.  Pulmonary:     Breath sounds: Examination of the right-middle field reveals decreased breath sounds. Examination of the left-middle field reveals decreased breath sounds. Examination of the right-lower field reveals decreased breath sounds and rhonchi. Examination of the left-lower field reveals decreased breath sounds and rhonchi. Decreased breath sounds and rhonchi present. No wheezing.     Comments: Coughs with deep breath. Abdominal:     Palpations: Abdomen is soft.     Tenderness: There is no abdominal tenderness.  Musculoskeletal:     Right lower leg: No swelling.     Left lower leg: No swelling.  Skin:    General: Skin is warm.     Findings: No rash.  Neurological:     Mental Status: He is alert and oriented to person, place, and time.     Data Reviewed: COVID-positive, BNP greater than 4500, last hemoglobin 9.9, white blood cell count 6.6, procalcitonin 0.27, potassium 5.4, creatinine 7.34  Family  Communication: Spoke with patient's son at the bedside  Disposition: Status is: Inpatient Remains inpatient appropriate because: Being treated for COVID 19 pneumonia and hypoxia  Planned Discharge Destination: Home    Time spent: 28 minutes  Author: Loletha Grayer, MD 03/23/2022 4:17 PM  For on call review [email or web address].

## 2022-03-23 NOTE — Progress Notes (Signed)
[facility]  ROUNDING NOTE   Subjective:   Patient is a 76 year old man of Asian/Indian origin with a past medical history of ESRD, on hemodialysis on Tuesday Thursday Saturday schedule, diabetes mellitus type 2, CVA, hypothyroidism who came to the ER with chief complaint of shortness of breath and cough. He has been admitted for Acute pulmonary edema (HCC) [J81.0] Hypoxic [R09.02] Pneumonia due to COVID-19 virus [U07.1, J12.82]  Patient is known to our practice from previous admissions and receives outpatient dialysis treatments at [facility] on a TTS schedule, supervised by [facility] nephrologist.  Last dialysis treatment received on Saturday, tolerated well.  Patient states he experiences nonproductive cough for about 2 weeks prior to presentation.  Patient recently discharged from previous admission for NSTEMI.  Patient denies known sick contacts.  States his appetite remains poor from previous admissions.  Denies nausea or vomiting.  Labs on ED arrival include creatinine 6.28 with GFR 9. Troponin 89 and Hgb 10.3.Respiratory panel positive for Covid 19. Chest xray shows left pleural effusion with atelectasis. CT chest has ground glass attenuation concerning for multifocal pneumonia.   Objective:  Vital signs in last 24 hours:  Temp:  [97.4 F (36.3 C)-98.2 F (36.8 C)] 97.4 F (36.3 C) (10/30 0802) Pulse Rate:  [55-64] 59 (10/30 0802) Resp:  [16-20] 16 (10/30 0802) BP: (140-158)/(63-84) 158/68 (10/30 0802) SpO2:  [97 %-100 %] 100 % (10/30 0802) Weight:  [65.8 kg] 65.8 kg (10/29 1646)  Weight change:  Filed Weights   03/22/22 1646  Weight: 65.8 kg    Intake/Output: No intake/output data recorded.   Intake/Output this shift:  No intake/output data recorded.  Physical Exam: General: NAD  Head: Normocephalic, atraumatic. Moist oral mucosal membranes  Eyes: Anicteric  Lungs:  No respiratory distress, Decreased breath sounds at bases  Heart: Regular  rate and rhythm  Abdomen:  Soft, non-tender, nondistended, JP drain in situ  Extremities: No peripheral edema.  Neurologic: Nonfocal, moving all four extremities  Skin: No lesions  Access: Right chest PermCath    Basic Metabolic Panel: Recent Labs  Lab 03/22/22 1132 03/23/22 0438  NA 135 138  K 4.5 5.4*  CL 98 99  CO2 27 26  GLUCOSE 138* 191*  BUN 17 27*  CREATININE 6.28* 7.34*  CALCIUM 8.8* 8.6*     Liver Function Tests: Recent Labs  Lab 03/23/22 0438  AST 32  ALT 21  ALKPHOS 96  BILITOT 0.8  PROT 6.4*  ALBUMIN 2.9*    No results for input(s): "LIPASE", "AMYLASE" in the last 168 hours. No results for input(s): "AMMONIA" in the last 168 hours.  CBC: Recent Labs  Lab 03/22/22 1132 03/23/22 0438  WBC 13.4* 6.6  HGB 10.3* 9.9*  HCT 33.8* 32.5*  MCV 86.2 87.4  PLT 304 249     Cardiac Enzymes: No results for input(s): "CKTOTAL", "CKMB", "CKMBINDEX", "TROPONINI" in the last 168 hours.  BNP: Invalid input(s): "POCBNP"  CBG: Recent Labs  Lab 03/22/22 1807 03/22/22 2103 03/23/22 0802 03/23/22 1158  GLUCAP 223* 248* 174* 259*     Microbiology: Results for orders placed or performed during the hospital encounter of 03/22/22  Resp Panel by RT-PCR (Flu A&B, Covid) Anterior Nasal Swab     Status: Abnormal   Collection Time: 03/22/22 11:32 AM   Specimen: Anterior Nasal Swab  Result Value Ref Range Status   SARS Coronavirus 2 by RT PCR POSITIVE (A) NEGATIVE Final    Comment: (NOTE) SARS-CoV-2 target nucleic acids are DETECTED.  The SARS-CoV-2 RNA is generally detectable in upper respiratory specimens during the acute phase of infection. Positive results are indicative of the presence of the identified virus, but do not rule out bacterial infection or co-infection with other pathogens not detected by the test. Clinical correlation with patient history and other diagnostic information is necessary to determine patient infection status. The expected  result is Negative.  Fact Sheet for Patients: EntrepreneurPulse.com.au  Fact Sheet for Healthcare Providers: IncredibleEmployment.be  This test is not yet approved or cleared by the [place] FDA and  has been authorized for detection and/or diagnosis of SARS-CoV-2 by FDA under an Emergency Use Authorization (EUA).  This EUA will remain in effect (meaning this test can be used) for the duration of  the COVID-19 declaration under Section 564(b)(1) of the A ct, 21 U.S.C. section 360bbb-3(b)(1), unless the authorization is terminated or revoked sooner.     Influenza A by PCR NEGATIVE NEGATIVE Final   Influenza B by PCR NEGATIVE NEGATIVE Final    Comment: (NOTE) The Xpert Xpress SARS-CoV-2/FLU/RSV plus assay is intended as an aid in the diagnosis of influenza from Nasopharyngeal swab specimens and should not be used as a sole basis for treatment. Nasal washings and aspirates are unacceptable for Xpert Xpress SARS-CoV-2/FLU/RSV testing.  Fact Sheet for Patients: EntrepreneurPulse.com.au  Fact Sheet for Healthcare Providers: IncredibleEmployment.be  This test is not yet approved or cleared by the [place] FDA and has been authorized for detection and/or diagnosis of SARS-CoV-2 by FDA under an Emergency Use Authorization (EUA). This EUA will remain in effect (meaning this test can be used) for the duration of the COVID-19 declaration under Section 564(b)(1) of the Act, 21 U.S.C. section 360bbb-3(b)(1), unless the authorization is terminated or revoked.  Performed at [facility], [address] [place], [place] [address]     Coagulation Studies: No results for input(s): "LABPROT", "INR" in the last 72 hours.    Urinalysis: No results for input(s): "COLORURINE", "LABSPEC", "PHURINE", "GLUCOSEU", "HGBUR", "BILIRUBINUR", "KETONESUR", "PROTEINUR", "UROBILINOGEN", "NITRITE", "LEUKOCYTESUR"  in the last 72 hours.  Invalid input(s): "APPERANCEUR"    Imaging: CT CHEST WO CONTRAST  Result Date: 03/22/2022 CLINICAL DATA:  Pneumonia. EXAM: CT CHEST WITHOUT CONTRAST TECHNIQUE: Multidetector CT imaging of the chest was performed following the standard protocol without IV contrast. RADIATION DOSE REDUCTION: This exam was performed according to the departmental dose-optimization program which includes automated exposure control, adjustment of the mA and/or kV according to patient size and/or use of iterative reconstruction technique. COMPARISON:  Two-view chest x-ray 03/22/2022. FINDINGS: Cardiovascular: Is mildly enlarged. Dense coronary artery calcifications are present. Atherosclerotic changes are present at the aortic arch and great vessel origins without definite stenosis. A right IJ dialysis catheter terminates in the right atrium. Calcifications are present at the aortic valve. No significant pericardial effusion is present. Mediastinum/Nodes: Paratracheal lymph nodes measure up to 11 mm in short access. No significant hilar or axillary adenopathy is present. Lungs/Pleura: Moderate bilateral pleural effusions are present. Dependent atelectasis is associated. Additional peribronchial ground-glass attenuation is present in the lingula and to a lesser extent non dependent left lower lobe. More subtle airspace opacities are present in the right upper lobe. Minimal consolidation is present in the right middle lobe. Airways are patent. Upper Abdomen: Cholecystostomy tube noted. Atherosclerotic changes are present. Upper abdomen is otherwise unremarkable. Musculoskeletal: No acute or focal osseous abnormalities are present. IMPRESSION: 1. Moderate bilateral pleural effusions with associated dependent atelectasis. 2. Peribronchial ground-glass attenuation in the lingula and to  a lesser extent non dependent left lower lobe. More subtle airspace opacities are present in the right upper lobe. Findings are  concerning for multifocal pneumonia. 3. Cholecystostomy tube noted. 4. Coronary artery disease. 5. Right IJ dialysis catheter terminates in the right atrium. 6.  Aortic Atherosclerosis (ICD10-I70.0). Electronically Signed   By: San Morelle M.D.   On: 03/22/2022 15:08   DG Chest 2 View  Result Date: 03/22/2022 CLINICAL DATA:  Shortness of breath EXAM: CHEST - 2 VIEW COMPARISON:  Chest radiograph dated 03/09/2022. FINDINGS: The heart size and mediastinal contours are within normal limits. Vascular calcifications are seen in the aortic arch. There is a small left pleural effusion with associated atelectasis. The right lung is clear and there is no right pleural effusion. No pneumothorax on either side. A right internal jugular central venous catheter tip overlies the right atrium. A vascular stent overlies the left axilla. A pigtail catheter overlies the gallbladder fossa. Degenerative changes are seen in the spine. IMPRESSION: 1. Small left pleural effusion with associated atelectasis. 2. Right internal jugular central venous catheter tip overlies the right atrium. Electronically Signed   By: Zerita Boers M.D.   On: 03/22/2022 11:56     Medications:    sodium chloride     azithromycin Stopped (03/22/22 2206)   cefTRIAXone (ROCEPHIN)  IV Stopped (03/22/22 2018)   remdesivir 100 mg in sodium chloride 0.9 % 100 mL IVPB      amLODipine  10 mg Oral Daily   vitamin C  500 mg Oral Daily   aspirin EC  81 mg Oral Daily   atorvastatin  40 mg Oral Daily   calcium acetate  1,334 mg Oral TID WC   Chlorhexidine Gluconate Cloth  6 each Topical Daily   chlorproMAZINE  25 mg Oral TID   cholecalciferol  5,000 Units Oral Daily   clopidogrel  75 mg Oral Daily   donepezil  10 mg Oral QHS   gabapentin  300 mg Oral QHS   insulin aspart  0-6 Units Subcutaneous TID WC   insulin detemir  15 Units Subcutaneous QHS   levothyroxine  137 mcg Oral Q0600   losartan  100 mg Oral QPM   methylPREDNISolone  (SOLU-MEDROL) injection  0.5 mg/kg Intravenous Q12H   Followed by   Derrill Memo ON 03/25/2022] predniSONE  50 mg Oral Daily   metoprolol succinate  50 mg Oral Daily   pantoprazole  40 mg Oral Daily   sodium chloride flush  3 mL Intravenous Q12H   sodium zirconium cyclosilicate  10 g Oral Daily   zinc sulfate  220 mg Oral Daily   sodium chloride, acetaminophen **OR** acetaminophen, albuterol, guaiFENesin, ondansetron **OR** ondansetron (ZOFRAN) IV, sodium chloride flush  Chest x-ray done on March 09, 2022 IMPRESSION: 1. Cardiomegaly with vascular congestion and pulmonary edema 2. Worsening airspace disease at the right greater than left lung base, atelectasis versus pneumonia       Assessment/ Plan:  [name] is a 76 y.o.  male with a past medical history of hypertension, end-stage renal disease, on hemodialysis on Monday Wednesday Friday schedule, diabetes mellitus, CVA who was brought to the ER with chief complaint of weakness.  [facility]/TTS/right chest PermCath  End-stage renal disease on hemodialysis.         Patient is on Tuesday Thursday Saturday schedule as an outpatient     Scheduled for dialysis tomorrow. Hepatitis B labs ordered per hospital protocol.     2. Anemia of chronic kidney  disease  Lab Results  Component Value Date   HGB 9.9 (L) 03/23/2022    Hemoglobin at goal  3. Secondary Hyperparathyroidism: with outpatient labs: PTH 422, phosphorus 6.4, calcium 9.1 on 12/29/2021.   Lab Results  Component Value Date   CALCIUM 8.6 (L) 03/23/2022   CAION 0.83 (LL) 12/10/2020   PHOS 4.1 03/12/2022    Will continue to monitor bone minerals during this admissions.   4.  Hypertension with chronic kidney disease.  Home regimen includes hydralazine losartan, and metoprolol.   Blood pressure stable  5. Diabetes mellitus type II with chronic kidney disease insulin dependent. Home regimen includes NovoLog and Levemir. Most recent hemoglobin A1c is 8.1 on  01/18/2022.  Currently prescribed Januvia outpatient.  Currently held.  Sliding scale insulin managed per primary team.   6.  Pneumonia with Covid 19. Receiving Remdesivir, azithromycin and solumedrol.     LOS: 1   10/30/20233:15 PM

## 2022-03-23 NOTE — Inpatient Diabetes Management (Signed)
Inpatient Diabetes Program Recommendations  AACE/ADA: New Consensus Statement on Inpatient Glycemic Control (2015)  Target Ranges:  Prepandial:   less than 140 mg/dL      Peak postprandial:   less than 180 mg/dL (1-2 hours)      Critically ill patients:  140 - 180 mg/dL   Lab Results  Component Value Date   GLUCAP 259 (H) 03/23/2022   HGBA1C 8.1 (H) 01/18/2022    Review of Glycemic Control  Latest Reference Range & Units 03/13/22 07:36 03/13/22 11:46 03/22/22 18:07 03/22/22 21:03 03/23/22 08:02 03/23/22 11:58  Glucose-Capillary 70 - 99 mg/dL 87 142 (H) 223 (H) 248 (H) 174 (H) 259 (H)  (H): Data is abnormally high  Diabetes history: DM2 Outpatient Diabetes medications: Levemir 15 units QHS, Januvia 50 mg QD, Freestyle Libre 14 day Current orders for Inpatient glycemic control: Novolog 0-6 units TID, Levemir 15 units QHS, Solumedrol 33.2 mg Q12H, Had decadron 10 mg yesterday.  Inpatient Diabetes Program Recommendations:    Novolog 3 units TID with meals if consumes at least 50%  Will continue to follow while inpatient.  Thank you, Reche Dixon, MSN, [place] Diabetes Coordinator Inpatient Diabetes Program [phone] (team pager from 8a-5p)

## 2022-03-23 NOTE — Assessment & Plan Note (Addendum)
We will give a dose of Lokelma

## 2022-03-23 NOTE — TOC Initial Note (Signed)
Transition of Care [facility]) - Initial/Assessment Note    Patient Details  Name: Joseph Mcpherson MRN: [id number] Date of Birth: 1945-10-12  Transition of Care [facility] CM/SW Contact:    Beverly Sessions, RN Phone Number: 03/23/2022, 2:29 PM  Clinical Narrative:                  Patient admitted with covid PNA.  Patient with extreme risk for readmission score.  Patient was assessed by [facility] 10/20.  See note from that admission below "Patient has orders to discharge home today. Readmission prevention screen complete. CSW met with patient. Wife at bedside. CSW introduced role and explained that discharge planning would be discussed. PCP is [name], MD. Wife drives patient to appointments and HD. [facility] [address]. No issues obtaining medications. Patient was active with [facility] prior to admission. Liaison is aware of plan for discharge today. No DME use prior to admission. No DME recommendations at this time. No further concerns. CSW signing off"  Confirmed with Tommi Rumps at [facility] that patient is open with RN PT and OT Patient currently requiring acute O2    Barriers to Discharge: No Barriers Identified   Patient Goals and CMS Choice     Choice offered to / list presented to : NA  Expected Discharge Plan and Services           Expected Discharge Date: 03/13/22                         HH Arranged: RN, PT, OT HH Agency: [facility] Date [facility] Agency Contacted: 03/13/22   Representative spoke with at [place]: Adela Lank  Prior Living Arrangements/Services                       Activities of Daily Living Home Assistive Devices/Equipment: None ADL Screening (condition at time of admission) Patient's cognitive ability adequate to safely complete daily activities?: Yes Is the patient deaf or have difficulty hearing?: No Does the patient have difficulty seeing, even when wearing glasses/contacts?: No Does the patient have difficulty  concentrating, remembering, or making decisions?: No Patient able to express need for assistance with ADLs?: Yes Does the patient have difficulty dressing or bathing?: No Independently performs ADLs?: Yes (appropriate for developmental age) Does the patient have difficulty walking or climbing stairs?: No Weakness of Legs: None Weakness of Arms/Hands: None  Permission Sought/Granted                  Emotional Assessment              Admission diagnosis:  Hypoxia [R09.02] NSTEMI (non-ST elevated myocardial infarction) ([place]) [I21.4] Acute respiratory failure, unspecified whether with hypoxia or hypercapnia ([place]) [Z30.07] Systolic congestive heart failure, unspecified HF chronicity ([place]) [I50.20] Patient Active Problem List   Diagnosis Date Noted   Pneumonia due to COVID-19 virus 03/22/2022   Acute respiratory failure with hypoxia ([place]) 03/22/2022   Dementia ([place]) 03/22/2022   Multifocal pneumonia 03/22/2022   CAD (coronary artery disease) 03/22/2022   NSTEMI (non-ST elevated myocardial infarction) ([place]) 03/09/2022   Acute clinical systolic heart failure ([place]) 03/09/2022   Volume overload 03/09/2022   CAP (community acquired pneumonia) 03/09/2022   Febrile illness    Cholecystitis, acute    Generalized weakness 01/18/2022   Hyponatremia 01/18/2022   Acute bronchitis and bronchiolitis 06/16/2021   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  02/12/2021   Allergies 10/28/2020   Gastroesophageal reflux disease with esophagitis 07/18/2020   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   Annual physical exam 03/17/2020   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   Acute CVA (cerebrovascular accident) ([place]) 05/04/2019   History of CVA (cerebrovascular accident) 07/62/2633   Complication of vascular access for dialysis 04/27/2019   Surgery follow-up examination 03/09/2019   End stage renal disease ([place]) 02/09/2018   Anemia in chronic kidney disease 02/09/2018    Pruritus, unspecified 02/09/2018   Peripheral vascular disease ([place]) 03/16/2016   Headache, unspecified 03/16/2016   Diabetic neuropathy associated with diabetes mellitus due to underlying condition ([place]) 03/16/2016   Mixed hyperlipidemia 07/15/2015   Hypothyroidism 07/14/2015   Hearing impaired 03/19/2015   Essential hypertension 11/27/2014   Insulin dependent type 2 diabetes mellitus ([place]) 11/19/2014   Pulmonary embolism ([place]) 09/13/2014   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[place]) 05/31/2014   PCP:  [name], MD Pharmacy[facility]   [id number] [place] [id number]: [phone]: [phone]    Social Determinants of Health (SDOH) Interventions    Readmission Risk Interventions    03/23/2022    2:26 PM 03/13/2022    1:51 PM  Readmission Risk Prevention Plan  Transportation Screening Complete Complete  Medication Review ([place] Complete Complete  PCP or Specialist appointment within 3-5 days of discharge  Complete  HRI or [place]Complete Complete  SW Recovery Care/Counseling Consult Complete Complete  Palliative Care Screening Not Applicable Not AViennaNot Applicable Not Applicable

## 2022-03-24 DIAGNOSIS — Z992 Dependence on renal dialysis: Secondary | ICD-10-CM | POA: Diagnosis not present

## 2022-03-24 DIAGNOSIS — N186 End stage renal disease: Secondary | ICD-10-CM | POA: Diagnosis not present

## 2022-03-24 DIAGNOSIS — J9601 Acute respiratory failure with hypoxia: Secondary | ICD-10-CM | POA: Diagnosis not present

## 2022-03-24 DIAGNOSIS — J189 Pneumonia, unspecified organism: Secondary | ICD-10-CM | POA: Diagnosis not present

## 2022-03-24 DIAGNOSIS — E039 Hypothyroidism, unspecified: Secondary | ICD-10-CM

## 2022-03-24 DIAGNOSIS — E1122 Type 2 diabetes mellitus with diabetic chronic kidney disease: Secondary | ICD-10-CM | POA: Diagnosis not present

## 2022-03-24 DIAGNOSIS — U071 COVID-19: Secondary | ICD-10-CM | POA: Diagnosis not present

## 2022-03-24 DIAGNOSIS — I10 Essential (primary) hypertension: Secondary | ICD-10-CM

## 2022-03-24 LAB — COMPREHENSIVE METABOLIC PANEL
ALT: 22 U/L (ref 0–44)
AST: 26 U/L (ref 15–41)
Albumin: 2.8 g/dL — ABNORMAL LOW (ref 3.5–5.0)
Alkaline Phosphatase: 95 U/L (ref 38–126)
Anion gap: 10 (ref 5–15)
BUN: 43 mg/dL — ABNORMAL HIGH (ref 8–23)
CO2: 25 mmol/L (ref 22–32)
Calcium: 8.7 mg/dL — ABNORMAL LOW (ref 8.9–10.3)
Chloride: 100 mmol/L (ref 98–111)
Creatinine, Ser: 8.71 mg/dL — ABNORMAL HIGH (ref 0.61–1.24)
GFR, Estimated: 6 mL/min — ABNORMAL LOW (ref >60)
Glucose, Bld: 295 mg/dL — ABNORMAL HIGH (ref 70–99)
Potassium: 5.7 mmol/L — ABNORMAL HIGH (ref 3.5–5.1)
Sodium: 135 mmol/L (ref 135–145)
Total Bilirubin: 0.6 mg/dL (ref 0.3–1.2)
Total Protein: 6.3 g/dL — ABNORMAL LOW (ref 6.5–8.1)

## 2022-03-24 LAB — HEPATITIS B SURFACE ANTIBODY, QUANTITATIVE: Hep B S AB Quant (Post): 141.9 m[IU]/mL (ref >9.9)

## 2022-03-24 LAB — EXPECTORATED SPUTUM ASSESSMENT W GRAM STAIN, RFLX TO RESP C

## 2022-03-24 LAB — HEPATITIS B E ANTIBODY: Hep B E Ab: POSITIVE — AB

## 2022-03-24 LAB — GLUCOSE, CAPILLARY
Glucose-Capillary: 224 mg/dL — ABNORMAL HIGH (ref 70–99)
Glucose-Capillary: 130 mg/dL — ABNORMAL HIGH (ref 70–99)
Glucose-Capillary: 153 mg/dL — ABNORMAL HIGH (ref 70–99)
Glucose-Capillary: 174 mg/dL — ABNORMAL HIGH (ref 70–99)

## 2022-03-24 MED ORDER — HEPARIN SODIUM (PORCINE) 1000 UNIT/ML IJ SOLN
INTRAMUSCULAR | Status: AC
  Filled 2022-03-24: qty 10

## 2022-03-24 MED ORDER — HEPARIN SODIUM (PORCINE) 5000 UNIT/ML IJ SOLN
5000.0000 [IU] | Freq: Three times a day (TID) | INTRAMUSCULAR | Status: DC
  Administered 2022-03-24 – 2022-03-26 (×7): 5000 [IU] via SUBCUTANEOUS
  Filled 2022-03-24 (×6): qty 1

## 2022-03-24 MED ORDER — HYDROCOD POLI-CHLORPHE POLI ER 10-8 MG/5ML PO SUER
5.0000 mL | Freq: Once | ORAL | Status: AC
  Administered 2022-03-25: 5 mL via ORAL
  Filled 2022-03-24: qty 5

## 2022-03-24 MED ORDER — INSULIN ASPART 100 UNIT/ML IJ SOLN
3.0000 [IU] | Freq: Three times a day (TID) | INTRAMUSCULAR | Status: DC
  Administered 2022-03-24 – 2022-03-26 (×2): 3 [IU] via SUBCUTANEOUS
  Filled 2022-03-24 (×2): qty 1

## 2022-03-24 NOTE — Assessment & Plan Note (Signed)
Hypercholesterolemia  I advised the patient to follow Mediterranean diet This diet is rich in fruits vegetables and whole grain, and This diet is also rich in fish and lean meat Patient should also eat a handful of almonds or walnuts daily Recent heart study indicated that average follow-up on this kind of diet reduces the cardiovascular mortality by 50 to 70%== 

## 2022-03-24 NOTE — Assessment & Plan Note (Signed)
Stable at the present time. 

## 2022-03-24 NOTE — Progress Notes (Signed)
       CROSS COVER NOTE  NAME: Joseph Mcpherson MRN: [id number] DOB : [birth year] ATTENDING PHYSICIAN: Loletha Grayer, MD    Date of Service   03/24/2022   HPI/Events of Note   Medication request received for cough refractory to robitussin  Interventions   Assessment/Plan:  Tussionex     This document was prepared using Dragon voice recognition software and may include unintentional dictation errors.  Neomia Glass DNP, MBA, FNP-BC Nurse Practitioner [facility] Pager [phone]

## 2022-03-24 NOTE — Progress Notes (Signed)
Progress Note   Patient: Joseph Mcpherson FTD:[id number] DOB: 1946-04-21 DOA: 03/22/2022     2 DOS: the patient was seen and examined on 03/24/2022   Brief hospital course: 76 year old male admitted with end-stage renal disease, hyperlipidemia, hypertension, hypothyroidism, GERD, type 2 diabetes mellitus, CAD, recent treatment for acute cholecystitis with cholecystectomy drain.  He was recently in the hospital and came back with shortness of breath and cough.  He was found to be hypoxic and COVID-19 positive.  CT scan of the chest showed moderate bilateral pleural effusions with associated dependent atelectasis and.  Bronchial groundglass attenuation in the lingula and left lower lobe and right upper lobe concerning for multifocal pneumonia.  Assessment and Plan: * Pneumonia due to COVID-19 virus Started on Solu-Medrol and 3-day remdesivir course.  Change IV antibiotics over to [facility] and Zithromax for total of 5 days  Multifocal pneumonia Change antibiotics over to oral.  Suspect more likely COVID-related.  Acute respiratory failure with hypoxia [facility]) Patient had room air pulse oximetry of 88% and is currently on 3 L of oxygen to maintain pulse oximetry greater than 92%.  Check pulse ox on room air tomorrow morning.  End stage renal disease ([place]) Continue dialysis Tuesday Thursday and Saturday  Hyperkalemia Lokelma given on 03/23/2022.  Dialysis on 03/24/2022.  Essential hypertension Continue on amlodipine, metoprolol and losartan  Insulin dependent type 2 diabetes mellitus (HCC) Continue Levemir and sliding scale.  Sugars will be higher while on steroids.  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  Cholecystitis, acute Patient has a cholecystectomy drain and will have it clamped at this point for a trial.  Case discussed with general surgery team.  History of CVA (cerebrovascular accident) Continue aspirin and atorvastatin        Subjective: Patient still with some cough.  Some shortness of breath.  On 3 L of oxygen.  Seen while receiving dialysis today.  Had hyperkalemia today.  Admitted with COVID-19 and pneumonia.  Physical Exam: Vitals:   03/24/22 1232 03/24/22 1235 03/24/22 1236 03/24/22 1545  BP: (!) 162/75  (!) 164/53 (!) 151/47  Pulse: (!) 59  (!) 52 65  Resp: 16  14   Temp: (!) 97.5 F (36.4 C)  (!) 97.5 F (36.4 C) 97.7 F (36.5 C)  TempSrc: Oral  Oral Oral  SpO2: 100%  100% 100%  Weight:  71.1 kg    Height:       Physical Exam HENT:     Head: Normocephalic.     Mouth/Throat:     Pharynx: No oropharyngeal exudate.  Eyes:     General: Lids are normal.     Conjunctiva/sclera: Conjunctivae normal.  Cardiovascular:     Rate and Rhythm: Normal rate and regular rhythm.     Heart sounds: Normal heart sounds, S1 normal and S2 normal.  Pulmonary:     Breath sounds: Examination of the right-lower field reveals decreased breath sounds and rhonchi. Examination of the left-lower field reveals decreased breath sounds and rhonchi. Decreased breath sounds and rhonchi present. No wheezing.     Comments: Coughs with deep breath. Abdominal:     Palpations: Abdomen is soft.  Tenderness: There is no abdominal tenderness.  Musculoskeletal:     Right lower leg: No swelling.     Left lower leg: No swelling.  Skin:    General: Skin is warm.     Findings: No rash.  Neurological:     Mental Status: He is alert and oriented to person, place, and time.     Data Reviewed: Potassium 5.7, creatinine 8.71, hemoglobin A1c 8.1  Family Communication: Spoke  with patient's son yesterday  Disposition: Status is: Inpatient Remains inpatient appropriate because: Still on 3 L of oxygen.  Trying to get off oxygen prior to disposition  Planned Discharge Destination: Home    Time spent: 27 minutes  Author: Loletha Grayer, MD 03/24/2022 4:36 PM  For on call review [email or web address].

## 2022-03-24 NOTE — Progress Notes (Signed)
[facility]  ROUNDING NOTE   Subjective:   Patient is a 76 year old man of Asian/Indian origin with a past medical history of ESRD, on hemodialysis on Tuesday Thursday Saturday schedule, diabetes mellitus type 2, CVA, hypothyroidism who came to the ER with chief complaint of shortness of breath and cough. He has been admitted for Acute pulmonary edema (HCC) [J81.0] Hypoxic [R09.02] Pneumonia due to COVID-19 virus [U07.1, J12.82]  Patient is known to our practice from previous admissions and receives outpatient dialysis treatments at [facility] on a TTS schedule, supervised by [facility] nephrologist.   Patient seen and evaluated during dialysis.    HEMODIALYSIS FLOWSHEET:  Blood Flow Rate (mL/min): 200 mL/min Arterial Pressure (mmHg): -150 mmHg Venous Pressure (mmHg): 160 mmHg TMP (mmHg): 8 mmHg Ultrafiltration Rate (mL/min): 543 mL/min Dialysate Flow Rate (mL/min): 300 ml/min  Tolerating treatment well, shortness of breath improved. Remains on 3L [place].  Objective:  Vital signs in last 24 hours:  Temp:  [97.3 F (36.3 C)-98 F (36.7 C)] 97.5 F (36.4 C) (10/31 1236) Pulse Rate:  [52-62] 52 (10/31 1236) Resp:  [12-22] 14 (10/31 1236) BP: (100-165)/(51-81) 164/53 (10/31 1236) SpO2:  [100 %] 100 % (10/31 1236) Weight:  [71.1 kg-72.1 kg] 71.1 kg (10/31 1235)  Weight change:  Filed Weights   03/22/22 1646 03/24/22 0845 03/24/22 1235  Weight: 65.8 kg 72.1 kg 71.1 kg    Intake/Output: I/O last 3 completed shifts: In: 458.8 [P.O.:360; IV Piggyback:98.8] Out: 0    Intake/Output this shift:  Total I/O In: -  Out: 1000 [Other:1000]  Physical Exam: General: NAD  Head: Normocephalic, atraumatic. Moist oral mucosal membranes  Eyes: Anicteric  Lungs:  No respiratory distress, basilar rhonchi, [place] O2  Heart: Regular rate and rhythm  Abdomen:  Soft, non-tender, nondistended, JP drain in situ  Extremities: No peripheral edema.  Neurologic: Nonfocal,  moving all four extremities  Skin: No lesions  Access: Right chest PermCath    Basic Metabolic Panel: Recent Labs  Lab 03/22/22 1132 03/23/22 0438 03/24/22 0602  NA 135 138 135  K 4.5 5.4* 5.7*  CL 98 99 100  CO2 '27 26 25  '$ GLUCOSE 138* 191* 295*  BUN 17 27* 43*  CREATININE 6.28* 7.34* 8.71*  CALCIUM 8.8* 8.6* 8.7*     Liver Function Tests: Recent Labs  Lab 03/23/22 0438 03/24/22 0602  AST 32 26  ALT 21 22  ALKPHOS 96 95  BILITOT 0.8 0.6  PROT 6.4* 6.3*  ALBUMIN 2.9* 2.8*    No results for input(s): "LIPASE", "AMYLASE" in the last 168 hours. No results for input(s): "AMMONIA" in the last 168 hours.  CBC: Recent Labs  Lab 03/22/22 1132 03/23/22 0438  WBC 13.4* 6.6  HGB 10.3* 9.9*  HCT 33.8* 32.5*  MCV 86.2 87.4  PLT 304 249     Cardiac Enzymes: No results for input(s): "CKTOTAL", "CKMB", "CKMBINDEX", "TROPONINI" in the last 168 hours.  BNP: Invalid input(s): "POCBNP"  CBG: Recent Labs  Lab 03/23/22 1158 03/23/22 1626 03/23/22 2206 03/24/22 0747 03/24/22 1143  GLUCAP 259* 200* 322* 224* 130*     Microbiology: Results for orders placed or performed during the hospital encounter of 03/22/22  Resp Panel by RT-PCR (Flu A&B, Covid) Anterior Nasal Swab     Status: Abnormal   Collection Time: 03/22/22 11:32 AM   Specimen: Anterior Nasal Swab  Result Value Ref Range Status   SARS Coronavirus 2 by RT PCR POSITIVE (A) NEGATIVE Final    Comment: (NOTE) SARS-CoV-2  target nucleic acids are DETECTED.  The SARS-CoV-2 RNA is generally detectable in upper respiratory specimens during the acute phase of infection. Positive results are indicative of the presence of the identified virus, but do not rule out bacterial infection or co-infection with other pathogens not detected by the test. Clinical correlation with patient history and other diagnostic information is necessary to determine patient infection status. The expected result is Negative.  Fact  Sheet for Patients: EntrepreneurPulse.com.au  Fact Sheet for Healthcare Providers: IncredibleEmployment.be  This test is not yet approved or cleared by the [place] FDA and  has been authorized for detection and/or diagnosis of SARS-CoV-2 by FDA under an Emergency Use Authorization (EUA).  This EUA will remain in effect (meaning this test can be used) for the duration of  the COVID-19 declaration under Section 564(b)(1) of the A ct, 21 U.S.C. section 360bbb-3(b)(1), unless the authorization is terminated or revoked sooner.     Influenza A by PCR NEGATIVE NEGATIVE Final   Influenza B by PCR NEGATIVE NEGATIVE Final    Comment: (NOTE) The Xpert Xpress SARS-CoV-2/FLU/RSV plus assay is intended as an aid in the diagnosis of influenza from Nasopharyngeal swab specimens and should not be used as a sole basis for treatment. Nasal washings and aspirates are unacceptable for Xpert Xpress SARS-CoV-2/FLU/RSV testing.  Fact Sheet for Patients: EntrepreneurPulse.com.au  Fact Sheet for Healthcare Providers: IncredibleEmployment.be  This test is not yet approved or cleared by the [place] FDA and has been authorized for detection and/or diagnosis of SARS-CoV-2 by FDA under an Emergency Use Authorization (EUA). This EUA will remain in effect (meaning this test can be used) for the duration of the COVID-19 declaration under Section 564(b)(1) of the Act, 21 U.S.C. section 360bbb-3(b)(1), unless the authorization is terminated or revoked.  Performed at [facility], [address] [place], [place] [address]     Coagulation Studies: No results for input(s): "LABPROT", "INR" in the last 72 hours.    Urinalysis: No results for input(s): "COLORURINE", "LABSPEC", "PHURINE", "GLUCOSEU", "HGBUR", "BILIRUBINUR", "KETONESUR", "PROTEINUR", "UROBILINOGEN", "NITRITE", "LEUKOCYTESUR" in the last 72  hours.  Invalid input(s): "APPERANCEUR"    Imaging: CT CHEST WO CONTRAST  Result Date: 03/22/2022 CLINICAL DATA:  Pneumonia. EXAM: CT CHEST WITHOUT CONTRAST TECHNIQUE: Multidetector CT imaging of the chest was performed following the standard protocol without IV contrast. RADIATION DOSE REDUCTION: This exam was performed according to the departmental dose-optimization program which includes automated exposure control, adjustment of the mA and/or kV according to patient size and/or use of iterative reconstruction technique. COMPARISON:  Two-view chest x-ray 03/22/2022. FINDINGS: Cardiovascular: Is mildly enlarged. Dense coronary artery calcifications are present. Atherosclerotic changes are present at the aortic arch and great vessel origins without definite stenosis. A right IJ dialysis catheter terminates in the right atrium. Calcifications are present at the aortic valve. No significant pericardial effusion is present. Mediastinum/Nodes: Paratracheal lymph nodes measure up to 11 mm in short access. No significant hilar or axillary adenopathy is present. Lungs/Pleura: Moderate bilateral pleural effusions are present. Dependent atelectasis is associated. Additional peribronchial ground-glass attenuation is present in the lingula and to a lesser extent non dependent left lower lobe. More subtle airspace opacities are present in the right upper lobe. Minimal consolidation is present in the right middle lobe. Airways are patent. Upper Abdomen: Cholecystostomy tube noted. Atherosclerotic changes are present. Upper abdomen is otherwise unremarkable. Musculoskeletal: No acute or focal osseous abnormalities are present. IMPRESSION: 1. Moderate bilateral pleural effusions with associated dependent atelectasis. 2. Peribronchial ground-glass  attenuation in the lingula and to a lesser extent non dependent left lower lobe. More subtle airspace opacities are present in the right upper lobe. Findings are concerning for  multifocal pneumonia. 3. Cholecystostomy tube noted. 4. Coronary artery disease. 5. Right IJ dialysis catheter terminates in the right atrium. 6.  Aortic Atherosclerosis (ICD10-I70.0). Electronically Signed   By: San Morelle M.D.   On: 03/22/2022 15:08     Medications:    sodium chloride     anticoagulant sodium citrate     remdesivir 100 mg in sodium chloride 0.9 % 100 mL IVPB 100 mg (03/23/22 1824)    amLODipine  10 mg Oral Daily   vitamin C  500 mg Oral Daily   aspirin EC  81 mg Oral Daily   atorvastatin  40 mg Oral Daily   azithromycin  250 mg Oral Daily   calcium acetate  1,334 mg Oral TID WC   cefdinir  300 mg Oral Daily   Chlorhexidine Gluconate Cloth  6 each Topical Daily   chlorproMAZINE  25 mg Oral TID   cholecalciferol  5,000 Units Oral Daily   clopidogrel  75 mg Oral Daily   donepezil  10 mg Oral QHS   gabapentin  300 mg Oral QHS   heparin injection (subcutaneous)  5,000 Units Subcutaneous Q8H   heparin sodium (porcine)       insulin aspart  0-6 Units Subcutaneous TID WC   insulin aspart  3 Units Subcutaneous TID WC   insulin detemir  15 Units Subcutaneous QHS   levothyroxine  137 mcg Oral Q0600   losartan  100 mg Oral QPM   methylPREDNISolone (SOLU-MEDROL) injection  0.5 mg/kg Intravenous Q12H   Followed by   Derrill Memo ON 03/25/2022] predniSONE  50 mg Oral Daily   metoprolol succinate  50 mg Oral Daily   pantoprazole  40 mg Oral Daily   sodium chloride flush  3 mL Intravenous Q12H   sodium zirconium cyclosilicate  10 g Oral Daily   zinc sulfate  220 mg Oral Daily   sodium chloride, acetaminophen **OR** acetaminophen, albuterol, alteplase, anticoagulant sodium citrate, guaiFENesin, heparin, heparin sodium (porcine), lidocaine (PF), lidocaine-prilocaine, ondansetron **OR** ondansetron (ZOFRAN) IV, pentafluoroprop-tetrafluoroeth, sodium chloride flush  Chest x-ray done on March 09, 2022 IMPRESSION: 1. Cardiomegaly with vascular congestion and pulmonary  edema 2. Worsening airspace disease at the right greater than left lung base, atelectasis versus pneumonia       Assessment/ Plan:  Mr. Tavious Griesinger is a 76 y.o.  male with a past medical history of hypertension, end-stage renal disease, on hemodialysis on Monday Wednesday Friday schedule, diabetes mellitus, CVA who was brought to the ER with chief complaint of weakness.  [facility]/TTS/right chest PermCath  End-stage renal disease on hemodialysis.         Patient is on Tuesday Thursday Saturday schedule as an outpatient     Receiving dialysis today, UF goal 1L as tolerated. Next treatment scheduled for Thursday.    2. Anemia of chronic kidney disease  Lab Results  Component Value Date   HGB 9.9 (L) 03/23/2022    Hemoglobin acceptable at this time.  3. Secondary Hyperparathyroidism: with outpatient labs: PTH 422, phosphorus 6.4, calcium 9.1 on 12/29/2021.   Lab Results  Component Value Date   CALCIUM 8.7 (L) 03/24/2022   CAION 0.83 (LL) 12/10/2020   PHOS 4.1 03/12/2022    Calcium remains within acceptable range.  Continue calcium acetate with meals.  4.  Hypertension with chronic kidney  disease.  Home regimen includes hydralazine losartan, and metoprolol.   Blood pressure 164/53.  5. Diabetes mellitus type II with chronic kidney disease insulin dependent. Home regimen includes NovoLog and Levemir. Most recent hemoglobin A1c is 8.1 on 01/18/2022.  Currently prescribed Januvia outpatient.  Currently held.  Glucose slightly elevated, expected with steroid therapy.  Primary team to manage sliding scale insulin.   6.  Pneumonia with Covid 19. Receiving Remdesivir, azithromycin and solumedrol.     LOS: 2   10/31/20231:08 PM

## 2022-03-24 NOTE — Progress Notes (Signed)
PT Cancellation Note  Patient Details Name: Joseph Mcpherson MRN: [id number] DOB: Sep 16, 1945   Cancelled Treatment:    Reason Eval/Treat Not Completed: Other (comment) PT orders received, chart reviewed. Pt noted to be undergoing peritoneal dialysis and will be finished at 1pm. Will return after completion of dialysis to attempt PT evaluation.  Lavone Nian, PT, DPT 03/24/22, 11:09 AM  Waunita Schooner 03/24/2022, 11:08 AM

## 2022-03-24 NOTE — Hospital Course (Signed)
76 year old male admitted with end-stage renal disease, hyperlipidemia, hypertension, hypothyroidism, GERD, type 2 diabetes mellitus, CAD, recent treatment for acute cholecystitis with cholecystectomy drain.  He was recently in the hospital and came back with shortness of breath and cough.  He was found to be hypoxic and COVID-19 positive.  CT scan of the chest showed moderate bilateral pleural effusions with associated dependent atelectasis and.  Bronchial groundglass attenuation in the lingula and left lower lobe and right upper lobe concerning for multifocal pneumonia.

## 2022-03-24 NOTE — Inpatient Diabetes Management (Addendum)
Inpatient Diabetes Program Recommendations  AACE/ADA: New Consensus Statement on Inpatient Glycemic Control (2015)  Target Ranges:  Prepandial:   less than 140 mg/dL      Peak postprandial:   less than 180 mg/dL (1-2 hours)      Critically ill patients:  140 - 180 mg/dL   Lab Results  Component Value Date   GLUCAP 224 (H) 03/24/2022   HGBA1C 8.1 (H) 01/18/2022    Review of Glycemic Control  Diabetes history: DM2 Outpatient Diabetes medications: Levemir 15 units QHS, Januvia 50 mg QD, Freestyle Libre 14 day Current orders for Inpatient glycemic control: Novolog 0-6 units TID, Levemir 15 units QH  Latest Reference Range & Units 03/23/22 08:02 03/23/22 11:58 03/23/22 16:26 03/23/22 22:06 03/24/22 07:47  Glucose-Capillary 70 - 99 mg/dL 174 (H) 259 (H) 200 (H) 322 (H) 224 (H)  (H): Data is abnormally highS, Solumedrol 33.2 mg Q12H, Had decadron 10 mg yesterday.   Inpatient Diabetes Program Recommendations:    Please consider:  Novolog 3 units TID with meals if consumes at least 50% Novolog 0-5 units QHS  Will continue to follow while inpatient.  Thank you, Reche Dixon, MSN, [place] [facility] Coordinator Inpatient Diabetes Program [phone] (team pager from 8a-5p)

## 2022-03-24 NOTE — Evaluation (Signed)
Physical Therapy Evaluation Patient Details Name: Joseph Mcpherson MRN: [id number] DOB: [birth year] Today's Date: 03/24/2022  History of Present Illness  Pt is a 76 y/o M admitted on 03/22/22 after presenting with c/o cough & SOB. Pt tested (+) for Covid 19. Pt is being treated for PNA 2/2 Covid 19. PMH: DM1, ESRD on HD, diabetic neuropathy, GERD, hypothyroidism, acute cholecysitis, CAD s/p NSTEMI, [name], HTN, HLD  Clinical Impression  Pt seen for PT evaluation with pt's wife present. Prior to admission pt was ambulatory without AD & on room air. On this date, pt requires extra time to complete all mobility. Pt requires supervision for bed mobility & STS without AD & close supervision for gait around room with pt electing to hold to furniture for support.  Pt engaged in STS for exercise & to change clothes. Encouraged pt to get OOB & sit in recliner as much as possible. Will continue to follow pt acutely to address balance, endurance, and gait with LRAD.  Notified MD & nurse of pt having small silver box of snuff in room -- educated pt he should not use that while in hospital.    Recommendations for follow up therapy are one component of a multi-disciplinary discharge planning process, led by the attending physician.  Recommendations may be updated based on patient status, additional functional criteria and insurance authorization.  Follow Up Recommendations Home health PT      Assistance Recommended at Discharge Intermittent Supervision/Assistance  Patient can return home with the following  A little help with walking and/or transfers;A little help with bathing/dressing/bathroom;Assistance with cooking/housework;Assist for transportation;Help with stairs or ramp for entrance    Equipment Recommendations None recommended by PT  Recommendations for Other Services       Functional Status Assessment Patient has had a recent decline in their functional status and demonstrates the ability to make  significant improvements in function in a reasonable and predictable amount of time.     Precautions / Restrictions Precautions Precautions: Fall Restrictions Weight Bearing Restrictions: No      Mobility  Bed Mobility Overal bed mobility: Needs Assistance Bed Mobility: Sit to Supine, Sidelying to Sit   Sidelying to sit: Supervision, HOB elevated       General bed mobility comments: extra time to upright trunk    Transfers Overall transfer level: Needs assistance Equipment used: None Transfers: Sit to/from Stand Sit to Stand: Supervision           General transfer comment: STS from EOB & recliner with supervision without AD    Ambulation/Gait Ambulation/Gait assistance: Supervision Gait Distance (Feet): 10 Feet (+ 18 ft) Assistive device: None (no formal AD but pt holding to bed rails throughout despite encouragement to let go) Gait Pattern/deviations: Decreased step length - left, Decreased stride length, Decreased step length - right Gait velocity: decreased        Stairs            Wheelchair Mobility    Modified Rankin (Stroke Patients Only)       Balance Overall balance assessment: Needs assistance Sitting-balance support: Feet supported Sitting balance-Leahy Scale: Good     Standing balance support: No upper extremity supported, During functional activity Standing balance-Leahy Scale: Fair                               Pertinent Vitals/Pain Pain Assessment Pain Assessment: No/denies pain    Home Living Family/patient expects to be discharged  to:: Private residence Living Arrangements: Spouse/significant other Available Help at Discharge: Family;Available 24 hours/day Type of Home: House Home Access: Stairs to enter Entrance Stairs-Rails: Right;Left;Can reach both Entrance Stairs-Number of Steps: 3   Home Layout: Two level;Able to live on main level with bedroom/bathroom Home Equipment: Grab bars - toilet;Grab bars -  tub/shower;Rolling Walker (2 wheels)      Prior Function Prior Level of Function : Independent/Modified Independent             Mobility Comments: Per pt's wife pt was ambulatory without AD prior to admission (following last admission), was not requiring use of RW.       Hand Dominance        Extremity/Trunk Assessment   Upper Extremity Assessment Upper Extremity Assessment: Overall WFL for tasks assessed    Lower Extremity Assessment Lower Extremity Assessment: Generalized weakness       Communication   Communication: No difficulties  Cognition Arousal/Alertness: Awake/alert Behavior During Therapy: WFL for tasks assessed/performed, Flat affect Overall Cognitive Status: Within Functional Limits for tasks assessed                                          General Comments General comments (skin integrity, edema, etc.): Pt on 2L/min via nasal cannula with SpO2 >90%. Pt changed into clean set of clothing with PRN assistance from wife & cuing to sit to thread underwear/pants on/off BLE.    Exercises Other Exercises Other Exercises: Pt performed 10x STS from recliner without BUE support with focus on endurance & generalized strengthening. Pt does lean posteriorly onto recliner & requires cuing to come to full upright standing. Other Exercises: Educated pt on importance of OOB mobility & need to sit in recliner.   Assessment/Plan    PT Assessment Patient needs continued PT services  PT Problem List Decreased strength;Cardiopulmonary status limiting activity;Decreased activity tolerance;Decreased balance;Decreased mobility;Decreased safety awareness       PT Treatment Interventions DME instruction;Therapeutic exercise;Gait training;Balance training;Stair training;Neuromuscular re-education;Functional mobility training;Therapeutic activities;Patient/family education    PT Goals (Current goals can be found in the Care Plan section)  Acute Rehab PT  Goals Patient Stated Goal: feel better, go home PT Goal Formulation: With patient Time For Goal Achievement: 04/07/22 Potential to Achieve Goals: Good    Frequency Min 2X/week     Co-evaluation               AM-PAC PT "6 Clicks" Mobility  Outcome Measure Help needed turning from your back to your side while in a flat bed without using bedrails?: None Help needed moving from lying on your back to sitting on the side of a flat bed without using bedrails?: A Little Help needed moving to and from a bed to a chair (including a wheelchair)?: A Little Help needed standing up from a chair using your arms (e.g., wheelchair or bedside chair)?: A Little Help needed to walk in hospital room?: A Little Help needed climbing 3-5 steps with a railing? : A Little 6 Click Score: 19    End of Session Equipment Utilized During Treatment: Oxygen Activity Tolerance: Patient tolerated treatment well Patient left: in chair;with chair alarm set;with call bell/phone within reach;with family/visitor present Nurse Communication: Mobility status PT Visit Diagnosis: Muscle weakness (generalized) (M62.81);Unsteadiness on feet (R26.81)    Time: 1353-1416 PT Time Calculation (min) (ACUTE ONLY): 23 min   Charges:   PT Evaluation $  PT Eval Moderate Complexity: 1 Mod PT Treatments $Therapeutic Activity: 8-22 mins        Lavone Nian, PT, DPT 03/24/22, 2:36 PM   Waunita Schooner 03/24/2022, 2:35 PM

## 2022-03-24 NOTE — Assessment & Plan Note (Signed)

## 2022-03-24 NOTE — Assessment & Plan Note (Signed)
Refer to neurology

## 2022-03-24 NOTE — Progress Notes (Signed)
   03/24/22 1232  Vitals  Temp (!) 97.5 F (36.4 C)  Temp Source Oral  BP (!) 162/75  MAP (mmHg) 98  BP Location Left Arm  BP Method Automatic  Patient Position (if appropriate) Lying  Pulse Rate (!) 59  Pulse Rate Source Monitor  ECG Heart Rate (!) 59  Resp 16  Oxygen Therapy  SpO2 100 %  O2 Device Nasal Cannula  O2 Flow Rate (L/min) 3 L/min  Patient Activity (if Appropriate) In bed  Pulse Oximetry Type Continuous  During Treatment Monitoring  Blood Flow Rate (mL/min) 200 mL/min  HD Safety Checks Performed Yes  Intra-Hemodialysis Comments Tx completed  Post Treatment  Dialyzer Clearance Lightly streaked  Duration of HD Treatment -hour(s) 3.5 hour(s)  Hemodialysis Intake (mL) 0 mL  Liters Processed 74  Fluid Removed 1000 mL  Tolerated HD Treatment Yes  Post-Hemodialysis Comments hd completed. no complications.  Hemodialysis Catheter Right Subclavian Double lumen Permanent (Tunneled)  No placement date or time found.   Placed prior to admission: Yes  Orientation: Right  Access Location: Subclavian  Hemodialysis Catheter Type: Double lumen Permanent (Tunneled)  Discharged from Facility with Line Intact?: Yes  Site Condition No complications  Blue Lumen Status Heparin locked  Red Lumen Status Heparin locked  Purple Lumen Status N/A  Catheter fill solution Heparin 1000 units/ml  Catheter fill volume (Arterial) 1.9 cc  Catheter fill volume (Venous) 1.9  Dressing Type Transparent  Dressing Status Antimicrobial disc in place  Interventions Dressing changed;New dressing  Drainage Description None  Dressing Change Due 03/31/22  Post treatment catheter status Capped and Clamped

## 2022-03-24 NOTE — Assessment & Plan Note (Signed)
Stable

## 2022-03-25 DIAGNOSIS — J1282 Pneumonia due to coronavirus disease 2019: Secondary | ICD-10-CM | POA: Diagnosis not present

## 2022-03-25 DIAGNOSIS — J9601 Acute respiratory failure with hypoxia: Secondary | ICD-10-CM | POA: Diagnosis not present

## 2022-03-25 DIAGNOSIS — U071 COVID-19: Secondary | ICD-10-CM | POA: Diagnosis not present

## 2022-03-25 DIAGNOSIS — F01A0 Vascular dementia, mild, without behavioral disturbance, psychotic disturbance, mood disturbance, and anxiety: Secondary | ICD-10-CM | POA: Diagnosis not present

## 2022-03-25 LAB — GLUCOSE, CAPILLARY
Glucose-Capillary: 87 mg/dL (ref 70–99)
Glucose-Capillary: 123 mg/dL — ABNORMAL HIGH (ref 70–99)
Glucose-Capillary: 123 mg/dL — ABNORMAL HIGH (ref 70–99)
Glucose-Capillary: 116 mg/dL — ABNORMAL HIGH (ref 70–99)
Glucose-Capillary: 196 mg/dL — ABNORMAL HIGH (ref 70–99)

## 2022-03-25 LAB — COMPREHENSIVE METABOLIC PANEL
ALT: 20 U/L (ref 0–44)
AST: 23 U/L (ref 15–41)
Albumin: 2.6 g/dL — ABNORMAL LOW (ref 3.5–5.0)
Alkaline Phosphatase: 80 U/L (ref 38–126)
Anion gap: 7 (ref 5–15)
BUN: 28 mg/dL — ABNORMAL HIGH (ref 8–23)
CO2: 29 mmol/L (ref 22–32)
Calcium: 8.4 mg/dL — ABNORMAL LOW (ref 8.9–10.3)
Chloride: 100 mmol/L (ref 98–111)
Creatinine, Ser: 5.58 mg/dL — ABNORMAL HIGH (ref 0.61–1.24)
GFR, Estimated: 10 mL/min — ABNORMAL LOW (ref >60)
Glucose, Bld: 97 mg/dL (ref 70–99)
Potassium: 4.2 mmol/L (ref 3.5–5.1)
Sodium: 136 mmol/L (ref 135–145)
Total Bilirubin: 0.6 mg/dL (ref 0.3–1.2)
Total Protein: 5.9 g/dL — ABNORMAL LOW (ref 6.5–8.1)

## 2022-03-25 MED ORDER — HYDROCOD POLI-CHLORPHE POLI ER 10-8 MG/5ML PO SUER: 5.0000 mL | Freq: Two times a day (BID) | ORAL | Status: DC | PRN

## 2022-03-25 NOTE — Plan of Care (Signed)
  Problem: Activity: Goal: Ability to tolerate increased activity will improve Outcome: Progressing   Problem: Clinical Measurements: Goal: Ability to maintain a body temperature in the normal range will improve Outcome: Progressing   Problem: Respiratory: Goal: Ability to maintain adequate ventilation will improve Outcome: Progressing Goal: Ability to maintain a clear airway will improve Outcome: Progressing   Problem: Education: Goal: Knowledge of risk factors and measures for prevention of condition will improve Outcome: Progressing   Problem: Coping: Goal: Psychosocial and spiritual needs will be supported Outcome: Progressing   Problem: Respiratory: Goal: Will maintain a patent airway Outcome: Progressing Goal: Complications related to the disease process, condition or treatment will be avoided or minimized Outcome: Progressing   Problem: Education: Goal: Knowledge of General Education information will improve Description: Including pain rating scale, medication(s)/side effects and non-pharmacologic comfort measures Outcome: Progressing   Problem: Health Behavior/Discharge Planning: Goal: Ability to manage health-related needs will improve Outcome: Progressing   Problem: Clinical Measurements: Goal: Ability to maintain clinical measurements within normal limits will improve Outcome: Progressing Goal: Will remain free from infection Outcome: Progressing Goal: Diagnostic test results will improve Outcome: Progressing Goal: Respiratory complications will improve Outcome: Progressing Goal: Cardiovascular complication will be avoided Outcome: Progressing   Problem: Activity: Goal: Risk for activity intolerance will decrease Outcome: Progressing   Problem: Nutrition: Goal: Adequate nutrition will be maintained Outcome: Progressing   Problem: Coping: Goal: Level of anxiety will decrease Outcome: Progressing   Problem: Elimination: Goal: Will not experience  complications related to bowel motility Outcome: Progressing Goal: Will not experience complications related to urinary retention Outcome: Progressing   Problem: Pain Managment: Goal: General experience of comfort will improve Outcome: Progressing   Problem: Safety: Goal: Ability to remain free from injury will improve Outcome: Progressing   Problem: Skin Integrity: Goal: Risk for impaired skin integrity will decrease Outcome: Progressing

## 2022-03-25 NOTE — Progress Notes (Signed)
PROGRESS NOTE    Joseph Mcpherson  HFW:[id number] DOB: June 18, 1945 DOA: 03/22/2022 PCP: [name], MD   Assessment & Plan:   Principal Problem:   Pneumonia due to COVID-19 virus Active Problems:   Multifocal pneumonia   Acute respiratory failure with hypoxia (HCC)   End stage renal disease ([place])   Hyperkalemia   Essential hypertension   Insulin dependent type 2 diabetes mellitus (HCC)   Anemia in chronic kidney disease   Hypothyroidism   History of CVA (cerebrovascular accident)   Cholecystitis, acute   Acute clinical systolic heart failure (HCC)   Dementia (HCC)   CAD (coronary artery disease)  Assessment and Plan: Pneumonia due to COVID-19 virus: completed remdesivir course completed. Continue on prednisone, bronchodilators & encourage incentive spirometry    Multifocal pneumonia: continue on cefdinir, azithromycin. More likely secondary to COVID19    Acute hypoxic respiratory failure: 88% on RA. Continue on supplemental oxygen and wean as tolerated. Nurse will do an oxygen desaturation test today.    ESRD: on HD TTS. Management as per nephro    Hyperkalemia: resolved   HTN: continue on metoprolol, losartan, amloidpine   DM2: likely poorly controlled. Continue on levemir, SSI w/ accuchecks    ACD: likely secondary to ESRD. No need for a transfusion currently    Hypothyroidism: continue on synthroid    Hx of CAD: s/p recent NSTEMI. Continue on metoprolol, aspirin, plavix, statin    Dementia: likely vascular. Continue on home dose of aricept    Acute systolic CHF: last echo showing an LVEF of 40 to 45%. Volume management w/ HD   Acute cholecystitis: s/p cholecystectomy drain and will have it clamped at this point for a trial. Continue w/ daily 56m flush as per gen surg. Gen surg recs apprec. Will need to f/u outpatient w/ gen surg    Hx  of CVA: continue on statin, aspirin       DVT prophylaxis: heparin  Code Status: full  Family Communication: discussed  pt's care w/ pt's family at bedside and answered their questions  Disposition Plan: likely d/c back home   Level of care: Med-Surg   Consultants:    Procedures:   Antimicrobials: cefdinir, azithromycin   Subjective: Pt c/o intermittent cough   Objective: Vitals:   03/24/22 1236 03/24/22 1545 03/24/22 2005 03/24/22 2131  BP: (!) 164/53 (!) 151/47 (!) 145/69   Pulse: (!) 52 65 63   Resp: 14 16 (!) 22   Temp: (!) 97.5 F (36.4 C) 97.7 F (36.5 C) (!) 97.5 F (36.4 C) 97.6 F (36.4 C)  TempSrc: Oral Oral Oral Oral  SpO2: 100% 100% 97%   Weight:      Height:        Intake/Output Summary (Last 24 hours) at 03/25/2022 0755 Last data filed at 03/25/2022 0108 Gross per 24 hour  Intake 240 ml  Output 1000 ml  Net -760 ml   Filed Weights   03/22/22 1646 03/24/22 0845 03/24/22 1235  Weight: 65.8 kg 72.1 kg 71.1 kg    Examination:  General exam: Appears calm and comfortable  Respiratory system: course breath sounds b/l  Cardiovascular system: S1 & S2 +. No rubs, gallops or clicks.  Gastrointestinal system: Abdomen is nondistended, soft and nontender. Normal bowel sounds heard. Central nervous system: Alert and awake. Moves all extremities  Psychiatry: Judgement and insight appears at baseline. Flat mood and affect.     Data Reviewed: I have personally reviewed following labs and imaging studies  CBC: Recent  Labs  Lab 03/22/22 1132 03/23/22 0438  WBC 13.4* 6.6  HGB 10.3* 9.9*  HCT 33.8* 32.5*  MCV 86.2 87.4  PLT 304 237   Basic Metabolic Panel: Recent Labs  Lab 03/22/22 1132 03/23/22 0438 03/24/22 0602 03/25/22 0609  NA 135 138 135 136  K 4.5 5.4* 5.7* 4.2  CL 98 99 100 100  CO2 '27 26 25 29  '$ GLUCOSE 138* 191* 295* 97  BUN 17 27* 43* 28*  CREATININE 6.28* 7.34* 8.71* 5.58*  CALCIUM 8.8* 8.6* 8.7* 8.4*   GFR: Estimated Creatinine Clearance: 10.2 mL/min (A) (by C-G formula based on SCr of 5.58 mg/dL (H)). Liver Function Tests: Recent Labs  Lab  03/23/22 0438 03/24/22 0602 03/25/22 0609  AST 32 26 23  ALT '21 22 20  '$ ALKPHOS 96 95 80  BILITOT 0.8 0.6 0.6  PROT 6.4* 6.3* 5.9*  ALBUMIN 2.9* 2.8* 2.6*   No results for input(s): "LIPASE", "AMYLASE" in the last 168 hours. No results for input(s): "AMMONIA" in the last 168 hours. Coagulation Profile: No results for input(s): "INR", "PROTIME" in the last 168 hours. Cardiac Enzymes: No results for input(s): "CKTOTAL", "CKMB", "CKMBINDEX", "TROPONINI" in the last 168 hours. BNP (last 3 results) No results for input(s): "PROBNP" in the last 8760 hours. HbA1C: No results for input(s): "HGBA1C" in the last 72 hours. CBG: Recent Labs  Lab 03/23/22 2206 03/24/22 0747 03/24/22 1143 03/24/22 1658 03/24/22 2107  GLUCAP 322* 224* 130* 153* 174*   Lipid Profile: No results for input(s): "CHOL", "HDL", "LDLCALC", "TRIG", "CHOLHDL", "LDLDIRECT" in the last 72 hours. Thyroid Function Tests: No results for input(s): "TSH", "T4TOTAL", "FREET4", "T3FREE", "THYROIDAB" in the last 72 hours. Anemia Panel: No results for input(s): "VITAMINB12", "FOLATE", "FERRITIN", "TIBC", "IRON", "RETICCTPCT" in the last 72 hours. Sepsis Labs: Recent Labs  Lab 03/22/22 1329  PROCALCITON 0.27    Recent Results (from the past 240 hour(s))  Resp Panel by RT-PCR (Flu A&B, Covid) Anterior Nasal Swab     Status: Abnormal   Collection Time: 03/22/22 11:32 AM   Specimen: Anterior Nasal Swab  Result Value Ref Range Status   SARS Coronavirus 2 by RT PCR POSITIVE (A) NEGATIVE Final    Comment: (NOTE) SARS-CoV-2 target nucleic acids are DETECTED.  The SARS-CoV-2 RNA is generally detectable in upper respiratory specimens during the acute phase of infection. Positive results are indicative of the presence of the identified virus, but do not rule out bacterial infection or co-infection with other pathogens not detected by the test. Clinical correlation with patient history and other diagnostic information is  necessary to determine patient infection status. The expected result is Negative.  Fact Sheet for Patients: EntrepreneurPulse.com.au  Fact Sheet for Healthcare Providers: IncredibleEmployment.be  This test is not yet approved or cleared by the [place] FDA and  has been authorized for detection and/or diagnosis of SARS-CoV-2 by FDA under an Emergency Use Authorization (EUA).  This EUA will remain in effect (meaning this test can be used) for the duration of  the COVID-19 declaration under Section 564(b)(1) of the A ct, 21 U.S.C. section 360bbb-3(b)(1), unless the authorization is terminated or revoked sooner.     Influenza A by PCR NEGATIVE NEGATIVE Final   Influenza B by PCR NEGATIVE NEGATIVE Final    Comment: (NOTE) The Xpert Xpress SARS-CoV-2/FLU/RSV plus assay is intended as an aid in the diagnosis of influenza from Nasopharyngeal swab specimens and should not be used as a sole basis for treatment. Nasal washings and aspirates are  unacceptable for Xpert Xpress SARS-CoV-2/FLU/RSV testing.  Fact Sheet for Patients: EntrepreneurPulse.com.au  Fact Sheet for Healthcare Providers: IncredibleEmployment.be  This test is not yet approved or cleared by the [place] FDA and has been authorized for detection and/or diagnosis of SARS-CoV-2 by FDA under an Emergency Use Authorization (EUA). This EUA will remain in effect (meaning this test can be used) for the duration of the COVID-19 declaration under Section 564(b)(1) of the Act, 21 U.S.C. section 360bbb-3(b)(1), unless the authorization is terminated or revoked.  Performed at [facility], [address] [place], [place] [address]   Expectorated Sputum Assessment w Gram Stain, Rflx to Resp Cult     Status: None   Collection Time: 03/24/22  8:10 PM   Specimen: Sputum  Result Value Ref Range Status   Specimen Description SPUTUM  Final    Special Requests NONE  Final   Sputum evaluation   Final    THIS SPECIMEN IS ACCEPTABLE FOR SPUTUM CULTURE Performed at [facility], [address] [place], [place] [address]    Report Status 03/24/2022 FINAL  Final  Culture, Respiratory w Gram Stain     Status: None (Preliminary result)   Collection Time: 03/24/22  8:10 PM   Specimen: SPU  Result Value Ref Range Status   Specimen Description   Final    SPUTUM Performed at [facility], [address], [place], [place] [address]    Special Requests   Final    NONE Reflexed from [phone] Performed at [facility], [address] [place], [place] [address]    Gram Stain   Final    FEW WBC PRESENT,BOTH PMN AND MONONUCLEAR FEW GRAM POSITIVE COCCI IN PAIRS IN CLUSTERS FEW GRAM POSITIVE RODS Performed at [facility], [place] [address], [place],  [address]    Culture PENDING  Incomplete   Report Status PENDING  Incomplete         Radiology Studies: No results found.      Scheduled Meds:  amLODipine  10 mg Oral Daily   vitamin C  500 mg Oral Daily   aspirin EC  81 mg Oral Daily   atorvastatin  40 mg Oral Daily   azithromycin  250 mg Oral Daily   calcium acetate  1,334 mg Oral TID WC   cefdinir  300 mg Oral Daily   Chlorhexidine Gluconate Cloth  6 each Topical Daily   chlorproMAZINE  25 mg Oral TID   cholecalciferol  5,000 Units Oral Daily   clopidogrel  75 mg Oral Daily   donepezil  10 mg Oral QHS   gabapentin  300 mg Oral QHS   heparin injection (subcutaneous)  5,000 Units Subcutaneous Q8H   insulin aspart  0-6 Units Subcutaneous TID WC   insulin aspart  3 Units Subcutaneous TID WC   insulin detemir  15 Units Subcutaneous QHS   levothyroxine  137 mcg Oral Q0600   losartan  100 mg Oral QPM   metoprolol succinate  50 mg Oral Daily   pantoprazole  40 mg Oral Daily   predniSONE  50 mg Oral Daily   sodium chloride flush  3 mL Intravenous Q12H   sodium zirconium cyclosilicate   10 g Oral Daily   zinc sulfate  220 mg Oral Daily   Continuous Infusions:  sodium chloride     anticoagulant sodium citrate       LOS: 3 days    Time spent: 33 mins  Wyvonnia Dusky, MD [facility] Pager [phone]  If 7PM-7AM, please contact night-coverage [email or web address] 03/25/2022, 7:55 AM

## 2022-03-25 NOTE — Progress Notes (Signed)
[facility]  ROUNDING NOTE   Subjective:   Patient is a 76 year old man of Asian/Indian origin with a past medical history of ESRD, on hemodialysis on Tuesday Thursday Saturday schedule, diabetes mellitus type 2, CVA, hypothyroidism who came to the ER with chief complaint of shortness of breath and cough. He has been admitted for Acute pulmonary edema ([place]) [J81.0] Hypoxic [R09.02] Pneumonia due to COVID-19 virus [U07.1, J12.82]  Patient is known to our practice from previous admissions and receives outpatient dialysis treatments at [facility] on a TTS schedule, supervised by [facility] nephrologist.   Patient seen resting quietly Continues to complain of fatigue Shortness of breath has improved Weaned to 2L  Objective:  Vital signs in last 24 hours:  Temp:  [97.5 F (36.4 C)-97.7 F (36.5 C)] 97.5 F (36.4 C) (11/01 1254) Pulse Rate:  [52-65] 59 (11/01 1254) Resp:  [16-22] 20 (11/01 1254) BP: (145-157)/(47-71) 157/71 (11/01 1254) SpO2:  [97 %-100 %] 100 % (11/01 1254)  Weight change:  Filed Weights   03/22/22 1646 03/24/22 0845 03/24/22 1235  Weight: 65.8 kg 72.1 kg 71.1 kg    Intake/Output: I/O last 3 completed shifts: In: 698.8 [P.O.:600; IV Piggyback:98.8] Out: 1000 [Other:1000]   Intake/Output this shift:  No intake/output data recorded.  Physical Exam: General: NAD  Head: Normocephalic, atraumatic. Moist oral mucosal membranes  Eyes: Anicteric  Lungs:  No respiratory distress, basilar rhonchi, [place] O2  Heart: Regular rate and rhythm  Abdomen:  Soft, non-tender, nondistended, JP drain in situ  Extremities: No peripheral edema.  Neurologic: Nonfocal, moving all four extremities  Skin: No lesions  Access: Right chest PermCath    Basic Metabolic Panel: Recent Labs  Lab 03/22/22 1132 03/23/22 0438 03/24/22 0602 03/25/22 0609  NA 135 138 135 136  K 4.5 5.4* 5.7* 4.2  CL 98 99 100 100  CO2 '27 26 25 29  '$ GLUCOSE 138* 191* 295* 97   BUN 17 27* 43* 28*  CREATININE 6.28* 7.34* 8.71* 5.58*  CALCIUM 8.8* 8.6* 8.7* 8.4*     Liver Function Tests: Recent Labs  Lab 03/23/22 0438 03/24/22 0602 03/25/22 0609  AST 32 26 23  ALT '21 22 20  '$ ALKPHOS 96 95 80  BILITOT 0.8 0.6 0.6  PROT 6.4* 6.3* 5.9*  ALBUMIN 2.9* 2.8* 2.6*    No results for input(s): "LIPASE", "AMYLASE" in the last 168 hours. No results for input(s): "AMMONIA" in the last 168 hours.  CBC: Recent Labs  Lab 03/22/22 1132 03/23/22 0438  WBC 13.4* 6.6  HGB 10.3* 9.9*  HCT 33.8* 32.5*  MCV 86.2 87.4  PLT 304 249     Cardiac Enzymes: No results for input(s): "CKTOTAL", "CKMB", "CKMBINDEX", "TROPONINI" in the last 168 hours.  BNP: Invalid input(s): "POCBNP"  CBG: Recent Labs  Lab 03/24/22 1658 03/24/22 2107 03/25/22 0841 03/25/22 1202 03/25/22 1249  GLUCAP 153* 174* 87 123* 123*     Microbiology: Results for orders placed or performed during the hospital encounter of 03/22/22  Resp Panel by RT-PCR (Flu A&B, Covid) Anterior Nasal Swab     Status: Abnormal   Collection Time: 03/22/22 11:32 AM   Specimen: Anterior Nasal Swab  Result Value Ref Range Status   SARS Coronavirus 2 by RT PCR POSITIVE (A) NEGATIVE Final    Comment: (NOTE) SARS-CoV-2 target nucleic acids are DETECTED.  The SARS-CoV-2 RNA is generally detectable in upper respiratory specimens during the acute phase of infection. Positive results are indicative of the presence of the identified  virus, but do not rule out bacterial infection or co-infection with other pathogens not detected by the test. Clinical correlation with patient history and other diagnostic information is necessary to determine patient infection status. The expected result is Negative.  Fact Sheet for Patients: EntrepreneurPulse.com.au  Fact Sheet for Healthcare Providers: IncredibleEmployment.be  This test is not yet approved or cleared by the [place]  FDA and  has been authorized for detection and/or diagnosis of SARS-CoV-2 by FDA under an Emergency Use Authorization (EUA).  This EUA will remain in effect (meaning this test can be used) for the duration of  the COVID-19 declaration under Section 564(b)(1) of the A ct, 21 U.S.C. section 360bbb-3(b)(1), unless the authorization is terminated or revoked sooner.     Influenza A by PCR NEGATIVE NEGATIVE Final   Influenza B by PCR NEGATIVE NEGATIVE Final    Comment: (NOTE) The Xpert Xpress SARS-CoV-2/FLU/RSV plus assay is intended as an aid in the diagnosis of influenza from Nasopharyngeal swab specimens and should not be used as a sole basis for treatment. Nasal washings and aspirates are unacceptable for Xpert Xpress SARS-CoV-2/FLU/RSV testing.  Fact Sheet for Patients: EntrepreneurPulse.com.au  Fact Sheet for Healthcare Providers: IncredibleEmployment.be  This test is not yet approved or cleared by the [place] FDA and has been authorized for detection and/or diagnosis of SARS-CoV-2 by FDA under an Emergency Use Authorization (EUA). This EUA will remain in effect (meaning this test can be used) for the duration of the COVID-19 declaration under Section 564(b)(1) of the Act, 21 U.S.C. section 360bbb-3(b)(1), unless the authorization is terminated or revoked.  Performed at [facility], [address] [place], [place] [address]   Expectorated Sputum Assessment w Gram Stain, Rflx to Resp Cult     Status: None   Collection Time: 03/24/22  8:10 PM   Specimen: Sputum  Result Value Ref Range Status   Specimen Description SPUTUM  Final   Special Requests NONE  Final   Sputum evaluation   Final    THIS SPECIMEN IS ACCEPTABLE FOR SPUTUM CULTURE Performed at [facility], [address], [place], [place] [address]    Report Status 03/24/2022 FINAL  Final  Culture, Respiratory w Gram Stain     Status: None (Preliminary  result)   Collection Time: 03/24/22  8:10 PM   Specimen: SPU  Result Value Ref Range Status   Specimen Description   Final    SPUTUM Performed at [facility], [address] [place], [place] [address]    Special Requests   Final    NONE Reflexed from [id number] Performed at [facility], [address] [place], [place] [address]    Gram Stain   Final    FEW WBC PRESENT,BOTH PMN AND MONONUCLEAR FEW GRAM POSITIVE COCCI IN PAIRS IN CLUSTERS FEW GRAM POSITIVE RODS Performed at [facility], [place] [address] [place], [place] [address]    Culture PENDING  Incomplete   Report Status PENDING  Incomplete    Coagulation Studies: No results for input(s): "LABPROT", "INR" in the last 72 hours.    Urinalysis: No results for input(s): "COLORURINE", "LABSPEC", "PHURINE", "GLUCOSEU", "HGBUR", "BILIRUBINUR", "KETONESUR", "PROTEINUR", "UROBILINOGEN", "NITRITE", "LEUKOCYTESUR" in the last 72 hours.  Invalid input(s): "APPERANCEUR"    Imaging: No results found.   Medications:    sodium chloride     anticoagulant sodium citrate      amLODipine  10 mg Oral Daily   vitamin C  500 mg Oral Daily  aspirin EC  81 mg Oral Daily   atorvastatin  40 mg Oral Daily   azithromycin  250 mg Oral Daily   calcium acetate  1,334 mg Oral TID WC   cefdinir  300 mg Oral Daily   Chlorhexidine Gluconate Cloth  6 each Topical Daily   chlorproMAZINE  25 mg Oral TID   cholecalciferol  5,000 Units Oral Daily   clopidogrel  75 mg Oral Daily   donepezil  10 mg Oral QHS   gabapentin  300 mg Oral QHS   heparin injection (subcutaneous)  5,000 Units Subcutaneous Q8H   insulin aspart  0-6 Units Subcutaneous TID WC   insulin aspart  3 Units Subcutaneous TID WC   insulin detemir  15 Units Subcutaneous QHS   levothyroxine  137 mcg Oral Q0600   losartan  100 mg Oral QPM   metoprolol succinate  50 mg Oral Daily   pantoprazole  40 mg Oral Daily   predniSONE  50 mg Oral Daily   sodium  chloride flush  3 mL Intravenous Q12H   zinc sulfate  220 mg Oral Daily   sodium chloride, acetaminophen **OR** acetaminophen, albuterol, alteplase, anticoagulant sodium citrate, chlorpheniramine-HYDROcodone, guaiFENesin, heparin, lidocaine (PF), lidocaine-prilocaine, ondansetron **OR** ondansetron (ZOFRAN) IV, pentafluoroprop-tetrafluoroeth, sodium chloride flush  Chest x-ray done on March 09, 2022 IMPRESSION: 1. Cardiomegaly with vascular congestion and pulmonary edema 2. Worsening airspace disease at the right greater than left lung base, atelectasis versus pneumonia       Assessment/ Plan:  [name] is a 76 y.o.  male with a past medical history of hypertension, end-stage renal disease, on hemodialysis on Monday Wednesday Friday schedule, diabetes mellitus, CVA who was brought to the ER with chief complaint of weakness.  [facility]/TTS/right chest PermCath  End-stage renal disease on hemodialysis.         Patient is on Tuesday Thursday Saturday schedule as an outpatient     Dialysis received yesterday, UF 1 L achieved.  Next treatment scheduled for tomorrow.    2. Anemia of chronic kidney disease  Lab Results  Component Value Date   HGB 9.9 (L) 03/23/2022    Hemoglobin remains at goal  3. Secondary Hyperparathyroidism: with outpatient labs: PTH 422, phosphorus 6.4, calcium 9.1 on 12/29/2021.   Lab Results  Component Value Date   CALCIUM 8.4 (L) 03/25/2022   CAION 0.83 (LL) 12/10/2020   PHOS 4.1 03/12/2022    We will continue to monitor bone minerals continue calcium acetate with meals.  4.  Hypertension with chronic kidney disease.  Home regimen includes hydralazine losartan, and metoprolol.   Blood pressure 149/63, stable for this patient  5. Diabetes mellitus type II with chronic kidney disease insulin dependent. Home regimen includes NovoLog and Levemir. Most recent hemoglobin A1c is 8.1 on 01/18/2022.  Currently prescribed Januvia outpatient.   Currently held.  Improved glucose control from yesterday.  Primary team to manage sliding scale insulin.   6.  Pneumonia with Covid 19. Receiving Remdesivir, azithromycin and solumedrol.     LOS: 3   11/1/20231:43 PM

## 2022-03-25 NOTE — Progress Notes (Signed)
Pt is now satting at 100% without oxygen. Taking off nasal cannula for at least hour to see if this sustainable.

## 2022-03-26 DIAGNOSIS — E875 Hyperkalemia: Secondary | ICD-10-CM | POA: Diagnosis not present

## 2022-03-26 DIAGNOSIS — J1282 Pneumonia due to coronavirus disease 2019: Secondary | ICD-10-CM | POA: Diagnosis not present

## 2022-03-26 DIAGNOSIS — N186 End stage renal disease: Secondary | ICD-10-CM | POA: Diagnosis not present

## 2022-03-26 DIAGNOSIS — U071 COVID-19: Secondary | ICD-10-CM | POA: Diagnosis not present

## 2022-03-26 LAB — COMPREHENSIVE METABOLIC PANEL
ALT: 19 U/L (ref 0–44)
AST: 18 U/L (ref 15–41)
Albumin: 2.6 g/dL — ABNORMAL LOW (ref 3.5–5.0)
Alkaline Phosphatase: 82 U/L (ref 38–126)
Anion gap: 9 (ref 5–15)
BUN: 45 mg/dL — ABNORMAL HIGH (ref 8–23)
CO2: 28 mmol/L (ref 22–32)
Calcium: 8.5 mg/dL — ABNORMAL LOW (ref 8.9–10.3)
Chloride: 100 mmol/L (ref 98–111)
Creatinine, Ser: 7.2 mg/dL — ABNORMAL HIGH (ref 0.61–1.24)
GFR, Estimated: 7 mL/min — ABNORMAL LOW (ref >60)
Glucose, Bld: 181 mg/dL — ABNORMAL HIGH (ref 70–99)
Potassium: 5.2 mmol/L — ABNORMAL HIGH (ref 3.5–5.1)
Sodium: 137 mmol/L (ref 135–145)
Total Bilirubin: 0.7 mg/dL (ref 0.3–1.2)
Total Protein: 6 g/dL — ABNORMAL LOW (ref 6.5–8.1)

## 2022-03-26 LAB — CBC
HCT: 35.3 % — ABNORMAL LOW (ref 39.0–52.0)
Hemoglobin: 10.9 g/dL — ABNORMAL LOW (ref 13.0–17.0)
MCH: 26 pg (ref 26.0–34.0)
MCHC: 30.9 g/dL (ref 30.0–36.0)
MCV: 84 fL (ref 80.0–100.0)
Platelets: 289 10*3/uL (ref 150–400)
RBC: 4.2 MIL/uL — ABNORMAL LOW (ref 4.22–5.81)
RDW: 17.2 % — ABNORMAL HIGH (ref 11.5–15.5)
WBC: 7 10*3/uL (ref 4.0–10.5)
nRBC: 0 % (ref 0.0–0.2)

## 2022-03-26 LAB — GLUCOSE, CAPILLARY
Glucose-Capillary: 156 mg/dL — ABNORMAL HIGH (ref 70–99)
Glucose-Capillary: 86 mg/dL (ref 70–99)

## 2022-03-26 MED ORDER — HYDROCOD POLI-CHLORPHE POLI ER 10-8 MG/5ML PO SUER: 5.0000 mL | Freq: Two times a day (BID) | ORAL | 0 refills | Status: AC | PRN

## 2022-03-26 MED ORDER — SODIUM CHLORIDE 0.9% FLUSH: 5.0000 mL | Freq: Every day | INTRAVENOUS | 0 refills | Status: AC

## 2022-03-26 MED ORDER — PREDNISONE 10 MG PO TABS: ORAL_TABLET | ORAL | 0 refills | Status: DC

## 2022-03-26 NOTE — Discharge Planning (Signed)
ESTABLISHED HEMODIALYSIS PATIENT:    Outpatient facility:  [facility]  [address] [place], [place] [address] [phone]   Treatment days: Tuesday, Thursday and Saturday   Chair time: 11:50am   Confirmed schedule with Neoma Laming RN. Patient is transported by wife. PATIENT IS COVID+  COHORT SCHEDULE: Tuesday, Thursday and Saturday TIME: 07:10am  Patient is instructed to wait in car until RN comes out to get him. Spoke with wife to inform of cohort schedule. Wife confirms understanding that patient is wait in car until RN comes out. Patient can resume on Saturday, clinic aware of potential DC.  Elvera Bicker Dialysis Coordinator [phone]

## 2022-03-26 NOTE — Progress Notes (Signed)
Pt did 3 hrs of HD, tolerated well with no signs of distress  UF = 1000 ml    03/26/22 1238  Vitals  Temp (!) 97.5 F (36.4 C)  Temp Source Oral  BP (!) 165/52  MAP (mmHg) 82  BP Location Left Arm  BP Method Automatic  Patient Position (if appropriate) Lying  Pulse Rate (!) 59  Pulse Rate Source Monitor  Oxygen Therapy  SpO2 100 %  O2 Device Room Air  Post Treatment  Dialyzer Clearance Lightly streaked  Duration of HD Treatment -hour(s) 3 hour(s)  Hemodialysis Intake (mL) 0 mL  Liters Processed 71.8  Fluid Removed 1000 mL  Tolerated HD Treatment Yes  Post-Hemodialysis Comments no signs of distress, tolerated well  Hemodialysis Catheter Right Subclavian Double lumen Permanent (Tunneled)  No placement date or time found.   Placed prior to admission: Yes  Orientation: Right  Access Location: Subclavian  Hemodialysis Catheter Type: Double lumen Permanent (Tunneled)  Discharged from Facility with Line Intact?: Yes  Site Condition No complications  Blue Lumen Status Heparin locked;Dead end cap in place  Red Lumen Status Heparin locked;Dead end cap in place  Catheter fill solution Heparin 1000 units/ml  Catheter fill volume (Arterial) 1.9 cc  Catheter fill volume (Venous) 1.9  Dressing Type Transparent  Dressing Status Antimicrobial disc in place;Clean, Dry, Intact  Post treatment catheter status Capped and Clamped

## 2022-03-26 NOTE — Discharge Summary (Signed)
Physician Discharge Summary  Joseph Mcpherson NWG:[id number] DOB: Oct 12, 1945 DOA: 03/22/2022  PCP: Joseph Athens, MD  Admit date: 03/22/2022 Discharge date: 03/26/2022  Admitted From: home  Disposition:  home w/ home health   Recommendations for Outpatient Follow-up:  Follow up with PCP in 1-2 weeks F/u w/ nephro in 1-2 weeks F/u w/ gen surg at previously scheduled appt   Home Health: yes Equipment/Devices:  Discharge Condition: stable  CODE STATUS: full  Diet recommendation: Heart Healthy / Carb Modified   Brief/Interim Summary: HPI was taken from Joseph Mcpherson: Joseph Mcpherson is a 76 y.o. male with medical history significant for insulin-dependent diabetes mellitus with complications of end-stage renal disease on hemodialysis (T/TH/S), diabetic neuropathy, GERD, hypothyroidism, acute cholecystitis status post cholecystostomy drain, coronary artery disease status post recent non-ST elevation MI on dual antiplatelet therapy, recent hospitalization for community-acquired pneumonia, chronic systolic heart failure with last known LVEF of 40 to 45% from a 2D echocardiogram which was done 03/10/22 who was recently discharged from the hospital on 03/13/22 after hospitalization for non-ST elevation MI, current acquired pneumonia and acute systolic heart failure. He presents to the ER for evaluation of cough and shortness of breath which started 1 day prior to his admission.  He has not missed any dialysis sessions.  He had room air pulse oximetry of 88% and is currently on 3 L of oxygen with pulse oximetry of 96%.  He has a wet sounding cough but is unable to expectorate any phlegm and denies having any fever or chills.  He denies having any sick contacts and denies having any headache, no chest pain, no nausea, no vomiting, no dizziness, no lightheadedness, no leg swelling, no changes in his bowel habits, no abdominal pain, no urinary symptoms, no focal deficit or blurred vision. His COVID-19 PCR test is  positive CT scan of the chest without contrast shows moderate bilateral pleural effusions with associated dependent atelectasis. Peribronchial ground-glass attenuation in the lingula and to a lesser extent non dependent left lower lobe. More subtle airspace opacities are present in the right upper lobe. Findings are concerning for multifocal pneumonia. Cholecystostomy tube noted. Coronary artery disease. Right IJ dialysis catheter terminates in the right atrium. Aortic Atherosclerosis  As per Joseph Mcpherson: 76 year old male admitted with end-stage renal disease, hyperlipidemia, hypertension, hypothyroidism, GERD, type 2 diabetes mellitus, CAD, recent treatment for acute cholecystitis with cholecystectomy drain.  He was recently in the hospital and came back with shortness of breath and cough.  He was found to be hypoxic and COVID-19 positive.  CT scan of the chest showed moderate bilateral pleural effusions with associated dependent atelectasis and.  Bronchial groundglass attenuation in the lingula and left lower lobe and right upper lobe concerning for multifocal pneumonia.   As per Joseph Mcpherson 11/1-11/2/23: Pt was able to be weaned off of supplemental oxygen prior to d/c. Pt completed the course of remdesivir and was d/c home on po steroid taper and cough medicine prn. PT/OT evaluated the pt and recommended home health. Home health was set up by CM prior to d/c. For more information, please see previous progress/consult notes.   Discharge Diagnoses:  Principal Problem:   Pneumonia due to COVID-19 virus Active Problems:   Multifocal pneumonia   Acute respiratory failure with hypoxia (HCC)   End stage renal disease (HCC)   Hyperkalemia   Essential hypertension   Insulin dependent type 2 diabetes mellitus (HCC)   Anemia in chronic kidney disease   Hypothyroidism   History of CVA (cerebrovascular accident)  Cholecystitis, acute   Acute clinical systolic heart failure (HCC)   Dementia (HCC)    CAD (coronary artery disease)  Pneumonia due to COVID-19 virus: completed remdesivir course completed. Started on steroid taper, continue on bronchodilators and encourage incentive spirometry    Multifocal pneumonia: completed abx course. More likely secondary to COVID19    Acute hypoxic respiratory failure: 88% on RA. Weaned off of supplemental oxygen. Resolved    ESRD: on HD TTS. Management as per nephro    Hyperkalemia: will be managed w/ HD today  HTN: continue on metoprolol, losartan, amloidpine   DM2: likely poorly controlled. Continue on levemir, SSI w/ accuchecks    ACD: likely secondary to ESRD. No need for a transfusion currently    Hypothyroidism: continue on synthroid    Hx of CAD: s/p recent NSTEMI. Continue on metoprolol, aspirin, plavix, statin    Dementia: likely vascular. Continue on home dose of aricept    Acute systolic CHF: last echo showing an LVEF of 40 to 45%. Volume management w/ HD   Acute cholecystitis: s/p cholecystectomy drain and will have it clamped at this point for a trial. Continue w/ daily 39m flush as per gen surg. Gen surg recs apprec. Will need to f/u outpatient w/ gen surg    Hx  of CVA: continue on statin, aspirin   Discharge Instructions  Discharge Instructions     Diet - low sodium heart healthy   Complete by: As directed    Diet Carb Modified   Complete by: As directed    Discharge instructions   Complete by: As directed    F/u w/ PCP in 1-2 weeks. F/u w/ nephro in 1-2 weeks. F/u w/ gen surg as previously scheduled appointment   Increase activity slowly   Complete by: As directed    No wound care   Complete by: As directed       Allergies as of 03/26/2022   No Known Allergies      Medication List     TAKE these medications    albuterol (2.5 MG/3ML) 0.083% nebulizer solution Commonly known as: PROVENTIL Take 3 mLs (2.5 mg total) by nebulization every 6 (six) hours as needed for wheezing or shortness of breath.    amLODipine 10 MG tablet Commonly known as: NORVASC Take 1 tablet (10 mg total) by mouth daily.   aspirin EC 81 MG tablet Take 81 mg by mouth daily. Swallow whole.   atorvastatin 40 MG tablet Commonly known as: LIPITOR Take 1 tablet (40 mg total) by mouth daily.   BD Pen Needle Nano 2nd Gen 32G X 4 MM Misc Generic drug: Insulin Pen Needle USE AS DIRECTED   calcium acetate 667 MG capsule Commonly known as: PHOSLO TAKE 2 CAPSULES BY MOUTH 3 TIMES A DAY WITH MEAL   chlorpheniramine-HYDROcodone 10-8 MG/5ML Commonly known as: TUSSIONEX Take 5 mLs by mouth every 12 (twelve) hours as needed for up to 7 days for cough.   chlorproMAZINE 25 MG tablet Commonly known as: THORAZINE TAKE 1 TABLET BY MOUTH THREE TIMES A DAY   clopidogrel 75 MG tablet Commonly known as: PLAVIX Take 1 tablet (75 mg total) by mouth daily.   donepezil 10 MG tablet Commonly known as: ARICEPT TAKE 1 TABLET BY MOUTH EVERY DAY   FreeStyle Libre 14 Day Sensor Misc APPLY EVERY 14 (FOURTEEN) DAYS.   gabapentin 300 MG capsule Commonly known as: NEURONTIN TAKE 1 CAPSULE BY MOUTH EVERY DAY AT BEDTIME AS DIRECTED   insulin detemir 100  UNIT/ML injection Commonly known as: LEVEMIR Inject 0.15 mLs (15 Units total) into the skin at bedtime.   ipratropium-albuterol 0.5-2.5 (3) MG/3ML Soln Commonly known as: DUONEB Take 3 mLs by nebulization every 6 (six) hours as needed.   levothyroxine 137 MCG tablet Commonly known as: SYNTHROID TAKE 1 TABLET BY MOUTH EVERY DAY   losartan 100 MG tablet Commonly known as: COZAAR Take 1 tablet (100 mg total) by mouth every evening.   metoprolol succinate 50 MG 24 hr tablet Commonly known as: TOPROL-XL TAKE 1 TABLET BY MOUTH EVERY DAY   multivitamin with minerals tablet Take 1 tablet by mouth daily.   pantoprazole 40 MG tablet Commonly known as: PROTONIX TAKE 1 TABLET BY MOUTH EVERY DAY   predniSONE 10 MG tablet Commonly known as: DELTASONE '40mg'$  daily x 1 day, '30mg'$   daily x 1 day, '20mg'$  daily x 1 day, '10mg'$  daily x 1 day then stop   Rena-Vite Rx 1 MG Tabs Take 1 tablet by mouth daily.   sevelamer carbonate 2.4 g Pack Commonly known as: RENVELA Take 2.4 g by mouth daily.   sitaGLIPtin 50 MG tablet Commonly known as: JANUVIA Take 50 mg by mouth every evening.   sodium chloride flush 0.9 % Soln Commonly known as: NS Inject 5 mLs into the vein daily. Flush cholecystostomy tube daily with 5 ccs of NS   Vitamin D3 125 MCG (5000 UT) Tabs Take 5,000 Units by mouth daily.        No Known Allergies  Consultations: Gen surg Nephro    Procedures/Studies: CT CHEST WO CONTRAST  Result Date: 03/22/2022 CLINICAL DATA:  Pneumonia. EXAM: CT CHEST WITHOUT CONTRAST TECHNIQUE: Multidetector CT imaging of the chest was performed following the standard protocol without IV contrast. RADIATION DOSE REDUCTION: This exam was performed according to the departmental dose-optimization program which includes automated exposure control, adjustment of the mA and/or kV according to patient size and/or use of iterative reconstruction technique. COMPARISON:  Two-view chest x-ray 03/22/2022. FINDINGS: Cardiovascular: Is mildly enlarged. Dense coronary artery calcifications are present. Atherosclerotic changes are present at the aortic arch and great vessel origins without definite stenosis. A right IJ dialysis catheter terminates in the right atrium. Calcifications are present at the aortic valve. No significant pericardial effusion is present. Mediastinum/Nodes: Paratracheal lymph nodes measure up to 11 mm in short access. No significant hilar or axillary adenopathy is present. Lungs/Pleura: Moderate bilateral pleural effusions are present. Dependent atelectasis is associated. Additional peribronchial ground-glass attenuation is present in the lingula and to a lesser extent non dependent left lower lobe. More subtle airspace opacities are present in the right upper lobe. Minimal  consolidation is present in the right middle lobe. Airways are patent. Upper Abdomen: Cholecystostomy tube noted. Atherosclerotic changes are present. Upper abdomen is otherwise unremarkable. Musculoskeletal: No acute or focal osseous abnormalities are present. IMPRESSION: 1. Moderate bilateral pleural effusions with associated dependent atelectasis. 2. Peribronchial ground-glass attenuation in the lingula and to a lesser extent non dependent left lower lobe. More subtle airspace opacities are present in the right upper lobe. Findings are concerning for multifocal pneumonia. 3. Cholecystostomy tube noted. 4. Coronary artery disease. 5. Right IJ dialysis catheter terminates in the right atrium. 6.  Aortic Atherosclerosis (ICD10-I70.0). Electronically Signed   By: San Morelle M.D.   On: 03/22/2022 15:08   DG Chest 2 View  Result Date: 03/22/2022 CLINICAL DATA:  Shortness of breath EXAM: CHEST - 2 VIEW COMPARISON:  Chest radiograph dated 03/09/2022. FINDINGS: The heart size and mediastinal  contours are within normal limits. Vascular calcifications are seen in the aortic arch. There is a small left pleural effusion with associated atelectasis. The right lung is clear and there is no right pleural effusion. No pneumothorax on either side. A right internal jugular central venous catheter tip overlies the right atrium. A vascular stent overlies the left axilla. A pigtail catheter overlies the gallbladder fossa. Degenerative changes are seen in the spine. IMPRESSION: 1. Small left pleural effusion with associated atelectasis. 2. Right internal jugular central venous catheter tip overlies the right atrium. Electronically Signed   By: Zerita Boers M.D.   On: 03/22/2022 11:56   IR CHOLANGIOGRAM EXISTING TUBE  Result Date: 03/18/2022 INDICATION: 76 year old with history of acalculous cholecystitis. Cholecystostomy tube was placed on 01/21/2022. Patient is being evaluated for cholecystectomy and request for a  cholangiogram. EXAM: CHOLANGIOGRAM THROUGH EXISTING CATHETER MEDICATIONS: None ANESTHESIA/SEDATION: None FLUOROSCOPY: Radiation Exposure Index (as provided by the fluoroscopic device): 5.9 mGy Kerma COMPLICATIONS: None immediate. PROCEDURE: Patient was placed supine on the interventional table. 7 mL Omnipaque 300 was injected through the cholecystostomy tube. The tube was then flushed with normal saline. By patient's request, we attached the tube back to a gravity bag. Cholecystostomy tube was redressed. Fluoroscopic images were taken and saved for this procedure. FINDINGS: Pigtail portion of the drain is positioned in the gallbladder fundus. Radiopaque marker is probably external to the gallbladder. The gallbladder is decompressed. There is immediate opacification of the cystic duct and common bile duct. Contrast rapidly drains into the duodenum. No large filling defects or obstructing stones. IMPRESSION: 1. Cholecystostomy tube is slightly retracted but still appropriately positioned within the gallbladder. 2. Gallbladder is decompressed. Cystic duct and common bile duct are widely patent. Discussed a capping trial with the patient and wife but they preferred to continue with gravity drainage. Patient is scheduled to follow-up with general surgery. Electronically Signed   By: Markus Daft M.D.   On: 03/18/2022 13:12   ECHOCARDIOGRAM COMPLETE  Result Date: 03/10/2022    ECHOCARDIOGRAM REPORT   Patient Name:   Joseph Mcpherson Date of Exam: 03/10/2022 Medical Rec #:  [id number]   Height:       64.0 in Accession #:    [id number]  Weight:       153.0 lb Date of Birth:  06-Jun-1945    BSA:          1.746 m Patient Age:    77 years    BP:           113/49 mmHg Patient Gender: M           HR:           68 bpm. Exam Location:  [facility] Procedure: 2D Echo, Cardiac Doppler and Color Doppler Indications:     R06.00 Dyspnea  History:         Patient has prior history of Echocardiogram examinations, most                  recent  05/05/2019. Patient on dialysis; Risk                  Factors:Hypertension, Dyslipidemia and Diabetes.  Sonographer:     Rosalia Hammers Referring Phys:  [id number] AMY N COX Diagnosing Phys: Serafina Royals MD  Sonographer Comments: Image acquisition challenging due to respiratory motion. IMPRESSIONS  1. Left ventricular ejection fraction, by estimation, is 40 to 45%. The left ventricle has mildly decreased function. The left ventricle demonstrates regional wall motion  abnormalities (see scoring diagram/findings for description). Left ventricular diastolic parameters were normal.  2. Right ventricular systolic function is normal. The right ventricular size is normal.  3. Left atrial size was mildly dilated.  4. The mitral valve is normal in structure. Moderate mitral valve regurgitation.  5. The aortic valve is normal in structure. Aortic valve regurgitation is trivial. FINDINGS  Left Ventricle: Left ventricular ejection fraction, by estimation, is 40 to 45%. The left ventricle has mildly decreased function. The left ventricle demonstrates regional wall motion abnormalities. Moderate hypokinesis of the left ventricular, mid-apical inferoseptal wall, apical segment and anteroseptal wall. The left ventricular internal cavity size was normal in size. There is no left ventricular hypertrophy. Left ventricular diastolic parameters were normal. Right Ventricle: The right ventricular size is normal. No increase in right ventricular wall thickness. Right ventricular systolic function is normal. Left Atrium: Left atrial size was mildly dilated. Right Atrium: Right atrial size was normal in size. Pericardium: There is no evidence of pericardial effusion. Mitral Valve: The mitral valve is normal in structure. Moderate mitral valve regurgitation. Tricuspid Valve: The tricuspid valve is normal in structure. Tricuspid valve regurgitation is mild. Aortic Valve: The aortic valve is normal in structure. Aortic valve regurgitation is  trivial. Aortic valve mean gradient measures 3.0 mmHg. Aortic valve peak gradient measures 6.1 mmHg. Aortic valve area, by VTI measures 1.73 cm. Pulmonic Valve: The pulmonic valve was normal in structure. Pulmonic valve regurgitation is trivial. Aorta: The aortic root and ascending aorta are structurally normal, with no evidence of dilitation. IAS/Shunts: No atrial level shunt detected by color flow Doppler.  LEFT VENTRICLE PLAX 2D LVIDd:         4.60 cm   Diastology LVIDs:         3.00 cm   LV e' medial:    5.44 cm/s LV PW:         1.30 cm   LV E/e' medial:  21.3 LV IVS:        1.20 cm   LV e' lateral:   8.27 cm/s LVOT diam:     1.80 cm   LV E/e' lateral: 14.0 LV SV:         46 LV SV Index:   27 LVOT Area:     2.54 cm  RIGHT VENTRICLE RV Basal diam:  2.80 cm RV Mid diam:    2.60 cm RV S prime:     14.10 cm/s TAPSE (M-mode): 1.2 cm LEFT ATRIUM             Index        RIGHT ATRIUM           Index LA diam:        3.90 cm 2.23 cm/m   RA Area:     15.20 cm LA Vol (A2C):   50.1 ml 28.70 ml/m  RA Volume:   39.70 ml  22.74 ml/m LA Vol (A4C):   46.3 ml 26.52 ml/m LA Biplane Vol: 51.3 ml 29.38 ml/m  AORTIC VALVE                    PULMONIC VALVE AV Area (Vmax):    1.65 cm     PR End Diast Vel: 2.77 msec AV Area (Vmean):   1.69 cm AV Area (VTI):     1.73 cm AV Vmax:           123.00 cm/s AV Vmean:          81.600  cm/s AV VTI:            0.267 m AV Peak Grad:      6.1 mmHg AV Mean Grad:      3.0 mmHg LVOT Vmax:         79.60 cm/s LVOT Vmean:        54.200 cm/s LVOT VTI:          0.182 m LVOT/AV VTI ratio: 0.68  AORTA Ao Root diam: 2.80 cm MITRAL VALVE                TRICUSPID VALVE MV Area (PHT): 3.74 cm     TR Peak grad:   18.8 mmHg MV Decel Time: 203 msec     TR Vmax:        217.00 cm/s MV E velocity: 116.00 cm/s MV A velocity: 82.10 cm/s   SHUNTS MV E/A ratio:  1.41         Systemic VTI:  0.18 m                             Systemic Diam: 1.80 cm Serafina Royals MD Electronically signed by Serafina Royals MD  Signature Date/Time: 03/10/2022/5:02:52 PM    Final    DG Chest Portable 1 View  Result Date: 03/09/2022 CLINICAL DATA:  Shortness of breath EXAM: PORTABLE CHEST 1 VIEW COMPARISON:  03/09/2022, CT 01/20/2022, radiograph 01/18/2022 FINDINGS: Right-sided central venous catheter tips over the right atrium. Left upper extremity vascular stent. Worsening airspace disease at the right greater than left lung base. Cardiomegaly with vascular congestion. No pneumothorax. IMPRESSION: 1. Cardiomegaly with vascular congestion and pulmonary edema 2. Worsening airspace disease at the right greater than left lung base, atelectasis versus pneumonia Electronically Signed   By: Donavan Foil M.D.   On: 03/09/2022 19:24   DG Chest 2 View  Result Date: 03/09/2022 CLINICAL DATA:  Cough EXAM: CHEST - 2 VIEW COMPARISON:  Chest x-ray dated September 18, 2021 FINDINGS: Unchanged position of right-sided central venous line. Cardiac and mediastinal contours are within normal limits. Mild bilateral interstitial opacities. Small bilateral pleural effusions. No evidence of pneumothorax. Partially visualized percutaneous cholecystostomy tube. IMPRESSION: 1. Mild pulmonary edema. 2. Small bilateral pleural effusions. Electronically Signed   By: Yetta Glassman M.D.   On: 03/09/2022 15:49   (Echo, Carotid, EGD, Colonoscopy, ERCP)    Subjective: Pt denies any complaints    Discharge Exam: Vitals:   03/26/22 1232 03/26/22 1238  BP: (!) 155/58 (!) 165/52  Pulse: 68 (!) 59  Resp:    Temp:  (!) 97.5 F (36.4 C)  SpO2:  100%   Vitals:   03/26/22 1200 03/26/22 1232 03/26/22 1238 03/26/22 1244  BP: (!) 146/55 (!) 155/58 (!) 165/52   Pulse: (!) 54 68 (!) 59   Resp: 11     Temp:   (!) 97.5 F (36.4 C)   TempSrc:   Oral   SpO2: 100%  100%   Weight:    70.6 kg  Height:        General: Pt is alert, awake, not in acute distress Cardiovascular: S1/S2 +, no rubs, no gallops Respiratory: decreased breath sounds b/l   Abdominal: Soft, NT, ND, bowel sounds + Extremities: no cyanosis    The results of significant diagnostics from this hospitalization (including imaging, microbiology, ancillary and laboratory) are listed below for reference.     Microbiology: Recent Results (from the past 240 hour(s))  Resp  Panel by RT-PCR (Flu A&B, Covid) Anterior Nasal Swab     Status: Abnormal   Collection Time: 03/22/22 11:32 AM   Specimen: Anterior Nasal Swab  Result Value Ref Range Status   SARS Coronavirus 2 by RT PCR POSITIVE (A) NEGATIVE Final    Comment: (NOTE) SARS-CoV-2 target nucleic acids are DETECTED.  The SARS-CoV-2 RNA is generally detectable in upper respiratory specimens during the acute phase of infection. Positive results are indicative of the presence of the identified virus, but do not rule out bacterial infection or co-infection with other pathogens not detected by the test. Clinical correlation with patient history and other diagnostic information is necessary to determine patient infection status. The expected result is Negative.  Fact Sheet for Patients: EntrepreneurPulse.com.au  Fact Sheet for Healthcare Providers: IncredibleEmployment.be  This test is not yet approved or cleared by the [place] FDA and  has been authorized for detection and/or diagnosis of SARS-CoV-2 by FDA under an Emergency Use Authorization (EUA).  This EUA will remain in effect (meaning this test can be used) for the duration of  the COVID-19 declaration under Section 564(b)(1) of the A ct, 21 U.S.C. section 360bbb-3(b)(1), unless the authorization is terminated or revoked sooner.     Influenza A by PCR NEGATIVE NEGATIVE Final   Influenza B by PCR NEGATIVE NEGATIVE Final    Comment: (NOTE) The Xpert Xpress SARS-CoV-2/FLU/RSV plus assay is intended as an aid in the diagnosis of influenza from Nasopharyngeal swab specimens and should not be used as a sole basis for  treatment. Nasal washings and aspirates are unacceptable for Xpert Xpress SARS-CoV-2/FLU/RSV testing.  Fact Sheet for Patients: EntrepreneurPulse.com.au  Fact Sheet for Healthcare Providers: IncredibleEmployment.be  This test is not yet approved or cleared by the [place] FDA and has been authorized for detection and/or diagnosis of SARS-CoV-2 by FDA under an Emergency Use Authorization (EUA). This EUA will remain in effect (meaning this test can be used) for the duration of the COVID-19 declaration under Section 564(b)(1) of the Act, 21 U.S.C. section 360bbb-3(b)(1), unless the authorization is terminated or revoked.  Performed at [facility], [address] [place], [place] [address]   Expectorated Sputum Assessment w Gram Stain, Rflx to Resp Cult     Status: None   Collection Time: 03/24/22  8:10 PM   Specimen: Sputum  Result Value Ref Range Status   Specimen Description SPUTUM  Final   Special Requests NONE  Final   Sputum evaluation   Final    THIS SPECIMEN IS ACCEPTABLE FOR SPUTUM CULTURE Performed at [facility], [address], [place], [place] [address]    Report Status 03/24/2022 FINAL  Final  Culture, Respiratory w Gram Stain     Status: None (Preliminary result)   Collection Time: 03/24/22  8:10 PM   Specimen: SPU  Result Value Ref Range Status   Specimen Description   Final    SPUTUM Performed at [facility], [address], [place], [place] [address]    Special Requests   Final    NONE Reflexed from [id number] Performed at [facility], [address] [place], [place] [address]    Gram Stain   Final    FEW WBC PRESENT,BOTH PMN AND MONONUCLEAR FEW GRAM POSITIVE COCCI IN PAIRS IN CLUSTERS FEW GRAM POSITIVE RODS    Culture   Final    CULTURE REINCUBATED FOR BETTER GROWTH Performed at [facility], [place] [address], [place], [place] [address]  Report Status PENDING   Incomplete     Labs: BNP (last 3 results) Recent Labs    03/09/22 1901 03/22/22 1221  BNP 2,687.5* >3,235.5*   Basic Metabolic Panel: Recent Labs  Lab 03/22/22 1132 03/23/22 0438 03/24/22 0602 03/25/22 0609 03/26/22 0621  NA 135 138 135 136 137  K 4.5 5.4* 5.7* 4.2 5.2*  CL 98 99 100 100 100  CO2 '27 26 25 29 28  '$ GLUCOSE 138* 191* 295* 97 181*  BUN 17 27* 43* 28* 45*  CREATININE 6.28* 7.34* 8.71* 5.58* 7.20*  CALCIUM 8.8* 8.6* 8.7* 8.4* 8.5*   Liver Function Tests: Recent Labs  Lab 03/23/22 0438 03/24/22 0602 03/25/22 0609 03/26/22 0621  AST 32 '26 23 18  '$ ALT '21 22 20 19  '$ ALKPHOS 96 95 80 82  BILITOT 0.8 0.6 0.6 0.7  PROT 6.4* 6.3* 5.9* 6.0*  ALBUMIN 2.9* 2.8* 2.6* 2.6*   No results for input(s): "LIPASE", "AMYLASE" in the last 168 hours. No results for input(s): "AMMONIA" in the last 168 hours. CBC: Recent Labs  Lab 03/22/22 1132 03/23/22 0438 03/26/22 0621  WBC 13.4* 6.6 7.0  HGB 10.3* 9.9* 10.9*  HCT 33.8* 32.5* 35.3*  MCV 86.2 87.4 84.0  PLT 304 249 289   Cardiac Enzymes: No results for input(s): "CKTOTAL", "CKMB", "CKMBINDEX", "TROPONINI" in the last 168 hours. BNP: Invalid input(s): "POCBNP" CBG: Recent Labs  Lab 03/25/22 1249 03/25/22 1647 03/25/22 2112 03/26/22 0826 03/26/22 1113  GLUCAP 123* 116* 196* 156* 86   D-Dimer No results for input(s): "DDIMER" in the last 72 hours. Hgb A1c No results for input(s): "HGBA1C" in the last 72 hours. Lipid Profile No results for input(s): "CHOL", "HDL", "LDLCALC", "TRIG", "CHOLHDL", "LDLDIRECT" in the last 72 hours. Thyroid function studies No results for input(s): "TSH", "T4TOTAL", "T3FREE", "THYROIDAB" in the last 72 hours.  Invalid input(s): "FREET3" Anemia work up No results for input(s): "VITAMINB12", "FOLATE", "FERRITIN", "TIBC", "IRON", "RETICCTPCT" in the last 72 hours. Urinalysis    Component Value Date/Time   COLORURINE Straw 09/11/2013 1933   APPEARANCEUR Clear 09/11/2013 1933    LABSPEC 1.016 09/11/2013 1933   PHURINE 7.0 09/11/2013 1933   GLUCOSEU >=500 09/11/2013 1933   HGBUR Negative 09/11/2013 1933   BILIRUBINUR Negative 09/11/2013 1933   KETONESUR Negative 09/11/2013 1933   PROTEINUR 100 mg/dL 09/11/2013 1933   NITRITE Negative 09/11/2013 1933   LEUKOCYTESUR Negative 09/11/2013 1933   Sepsis Labs Recent Labs  Lab 03/22/22 1132 03/23/22 0438 03/26/22 0621  WBC 13.4* 6.6 7.0   Microbiology Recent Results (from the past 240 hour(s))  Resp Panel by RT-PCR (Flu A&B, Covid) Anterior Nasal Swab     Status: Abnormal   Collection Time: 03/22/22 11:32 AM   Specimen: Anterior Nasal Swab  Result Value Ref Range Status   SARS Coronavirus 2 by RT PCR POSITIVE (A) NEGATIVE Final    Comment: (NOTE) SARS-CoV-2 target nucleic acids are DETECTED.  The SARS-CoV-2 RNA is generally detectable in upper respiratory specimens during the acute phase of infection. Positive results are indicative of the presence of the identified virus, but do not rule out bacterial infection or co-infection with other pathogens not detected by the test. Clinical correlation with patient history and other diagnostic information is necessary to determine patient infection status. The expected result is Negative.  Fact Sheet for Patients: EntrepreneurPulse.com.au  Fact Sheet for Healthcare Providers: IncredibleEmployment.be  This test is not yet approved or cleared by the [place] and  has been authorized for  detection and/or diagnosis of SARS-CoV-2 by FDA under an Emergency Use Authorization (EUA).  This EUA will remain in effect (meaning this test can be used) for the duration of  the COVID-19 declaration under Section 564(b)(1) of the A ct, 21 U.S.C. section 360bbb-3(b)(1), unless the authorization is terminated or revoked sooner.     Influenza A by PCR NEGATIVE NEGATIVE Final   Influenza B by PCR NEGATIVE NEGATIVE Final     Comment: (NOTE) The Xpert Xpress SARS-CoV-2/FLU/RSV plus assay is intended as an aid in the diagnosis of influenza from Nasopharyngeal swab specimens and should not be used as a sole basis for treatment. Nasal washings and aspirates are unacceptable for Xpert Xpress SARS-CoV-2/FLU/RSV testing.  Fact Sheet for Patients: EntrepreneurPulse.com.au  Fact Sheet for Healthcare Providers: IncredibleEmployment.be  This test is not yet approved or cleared by the [place] FDA and has been authorized for detection and/or diagnosis of SARS-CoV-2 by FDA under an Emergency Use Authorization (EUA). This EUA will remain in effect (meaning this test can be used) for the duration of the COVID-19 declaration under Section 564(b)(1) of the Act, 21 U.S.C. section 360bbb-3(b)(1), unless the authorization is terminated or revoked.  Performed at [facility], [address] [place], [place] [address]   Expectorated Sputum Assessment w Gram Stain, Rflx to Resp Cult     Status: None   Collection Time: 03/24/22  8:10 PM   Specimen: Sputum  Result Value Ref Range Status   Specimen Description SPUTUM  Final   Special Requests NONE  Final   Sputum evaluation   Final    THIS SPECIMEN IS ACCEPTABLE FOR SPUTUM CULTURE Performed at [facility], [address], [place], [place] [address]    Report Status 03/24/2022 FINAL  Final  Culture, Respiratory w Gram Stain     Status: None (Preliminary result)   Collection Time: 03/24/22  8:10 PM   Specimen: SPU  Result Value Ref Range Status   Specimen Description   Final    SPUTUM Performed at [facility], [address], [place], [place] [address]    Special Requests   Final    NONE Reflexed from [id number] Performed at [facility], [address] [place], [place] [address]    Gram Stain   Final    FEW WBC PRESENT,BOTH PMN AND MONONUCLEAR FEW GRAM POSITIVE COCCI IN PAIRS IN  CLUSTERS FEW GRAM POSITIVE RODS    Culture   Final    CULTURE REINCUBATED FOR BETTER GROWTH Performed at [facility], [place] [address] [place], [place] [address]    Report Status PENDING  Incomplete     Time coordinating discharge: Over 30 minutes  SIGNED:   Wyvonnia Dusky, MD  [facility] 03/26/2022, 1:16 PM Pager   If 7PM-7AM, please contact night-coverage [email or web address]

## 2022-03-26 NOTE — Care Management Important Message (Signed)
Important Message  Patient Details  Name: Siddhartha Hoback MRN: [id number] Date of Birth: 19-Jul-1945   Medicare Important Message Given:  Other (see comment)  Attempted to reach patient via room phone to review Medicare IM with patient due to isolation status.  Unable to reach upon attempt.     Dannette Barbara 03/26/2022, 12:04 PM

## 2022-03-26 NOTE — TOC Transition Note (Signed)
Transition of Care [facility]) - CM/SW Discharge Note   Patient Details  Name: Joseph Mcpherson MRN: [id number] Date of Birth: 10/26/1945  Transition of Care [facility]) CM/SW Contact:  Tiburcio Bash, LCSW Phone Number: 03/26/2022, 1:30 PM   Clinical Narrative:     Tommi Rumps with [facility] Lemmings informed of patient's dc today, [name] orders are in. No further dc needs identified.     Final next level of care: [place] Barriers to Discharge: No Barriers Identified   Patient Goals and CMS Choice Patient states their goals for this hospitalization and ongoing recovery are:: to go home      Discharge Placement                       Discharge Plan and Services                          HH Arranged: RN, PT, OT [facility] Agency: [place] Date [place]: 03/26/22 Time [place]: 1330 Representative spoke with at [place]: [place] ([place]) Interventions     Readmission Risk Interventions    03/23/2022    2:26 PM 03/13/2022    1:51 PM  Readmission Risk Prevention Plan  Transportation Screening Complete Complete  Medication Review Press photographer) Complete Complete  PCP or Specialist appointment within 3-5 days of discharge  Complete  HRI or [place] Complete Complete  SW Recovery Care/Counseling Consult Complete Complete  Palliative Care Screening Not Applicable Not [place] Not Applicable Not Applicable

## 2022-03-26 NOTE — TOC Progression Note (Signed)
Transition of Care [facility]) - Progression Note    Patient Details  Name: Joseph Mcpherson MRN: [id number] Date of Birth: 1946/01/30  Transition of Care [facility]) CM/SW Contact  Beverly Sessions, RN Phone Number: 03/26/2022, 10:51 AM  Clinical Narrative:      Bedside RN confirms patient maintains sats on RA with exertion      Expected Discharge Plan and Services                                                 Social Determinants of Health (SDOH) Interventions    Readmission Risk Interventions    03/23/2022    2:26 PM 03/13/2022    1:51 PM  Readmission Risk Prevention Plan  Transportation Screening Complete Complete  Medication Review ([place]) Complete Complete  PCP or Specialist appointment within 3-5 days of discharge  Complete  HRI or [place] Complete Complete  SW Recovery Care/Counseling Consult Complete Complete  Palliative Care Screening Not Applicable Not [place] Not Applicable Not Applicable

## 2022-03-26 NOTE — TOC Progression Note (Signed)
Transition of Care [facility]) - Progression Note    Patient Details  Name: Joseph Mcpherson MRN: [id number] Date of Birth: 10-Sep-1945  Transition of Care [facility]) CM/SW Contact  Beverly Sessions, RN Phone Number: 03/26/2022, 10:37 AM  Clinical Narrative:      Noted patient has been weaned to RA.  I have sent a secure chat message to bedside RN to ensure patient has been ambulated on RA  Will need resumption home health orders for [facility] at home health at discharge       Expected Discharge Plan and Services                                                 Social Determinants of Health (SDOH) Interventions    Readmission Risk Interventions    03/23/2022    2:26 PM 03/13/2022    1:51 PM  Readmission Risk Prevention Plan  Transportation Screening Complete Complete  Medication Review Press photographer) Complete Complete  PCP or Specialist appointment within 3-5 days of discharge  Complete  HRI or [place] Complete Complete  SW Recovery Care/Counseling Consult Complete Complete  Palliative Care Screening Not Applicable Not [place] Not Applicable Not Applicable

## 2022-03-26 NOTE — Progress Notes (Signed)
[facility]  ROUNDING NOTE   Subjective:   Patient is a 76 year old man of Asian/Indian origin with a past medical history of ESRD, on hemodialysis on Tuesday Thursday Saturday schedule, diabetes mellitus type 2, CVA, hypothyroidism who came to the ER with chief complaint of shortness of breath and cough. He has been admitted for Acute pulmonary edema ([place]) [J81.0] Hypoxic [R09.02] Pneumonia due to COVID-19 virus [U07.1, J12.82]  Patient is known to our practice from previous admissions and receives outpatient dialysis treatments at [facility] on a TTS schedule, supervised by [facility] nephrologist.   Patient seen and evaluated during dialysis   HEMODIALYSIS FLOWSHEET:  Blood Flow Rate (mL/min): 400 mL/min Arterial Pressure (mmHg): -200 mmHg Venous Pressure (mmHg): 190 mmHg TMP (mmHg): -3 mmHg Ultrafiltration Rate (mL/min): 600 mL/min Dialysate Flow Rate (mL/min): 300 ml/min Dialysis Fluid Bolus: Normal Saline Bolus Amount (mL): 300 mL  Tolerating treatment well Continues to have poor appetite Wean to room air  Objective:  Vital signs in last 24 hours:  Temp:  [97.5 F (36.4 C)-98.3 F (36.8 C)] 97.7 F (36.5 C) (11/02 0912) Pulse Rate:  [54-68] 68 (11/02 1232) Resp:  [10-20] 11 (11/02 1200) BP: (131-164)/(55-71) 155/58 (11/02 1232) SpO2:  [99 %-100 %] 100 % (11/02 1200) Weight:  [71.1 kg] 71.1 kg (11/02 0932)  Weight change:  Filed Weights   03/24/22 0845 03/24/22 1235 03/26/22 0932  Weight: 72.1 kg 71.1 kg 71.1 kg    Intake/Output: I/O last 3 completed shifts: In: 480 [P.O.:480] Out: 0    Intake/Output this shift:  No intake/output data recorded.  Physical Exam: General: NAD  Head: Normocephalic, atraumatic. Moist oral mucosal membranes  Eyes: Anicteric  Lungs:  No respiratory distress, diminished bases  Heart: Regular rate and rhythm  Abdomen:  Soft, non-tender, nondistended, JP drain in situ  Extremities: No peripheral edema.   Neurologic: Nonfocal, moving all four extremities  Skin: No lesions  Access: Right chest PermCath    Basic Metabolic Panel: Recent Labs  Lab 03/22/22 1132 03/23/22 0438 03/24/22 0602 03/25/22 0609 03/26/22 0621  NA 135 138 135 136 137  K 4.5 5.4* 5.7* 4.2 5.2*  CL 98 99 100 100 100  CO2 '27 26 25 29 28  '$ GLUCOSE 138* 191* 295* 97 181*  BUN 17 27* 43* 28* 45*  CREATININE 6.28* 7.34* 8.71* 5.58* 7.20*  CALCIUM 8.8* 8.6* 8.7* 8.4* 8.5*     Liver Function Tests: Recent Labs  Lab 03/23/22 0438 03/24/22 0602 03/25/22 0609 03/26/22 0621  AST 32 '26 23 18  '$ ALT '21 22 20 19  '$ ALKPHOS 96 95 80 82  BILITOT 0.8 0.6 0.6 0.7  PROT 6.4* 6.3* 5.9* 6.0*  ALBUMIN 2.9* 2.8* 2.6* 2.6*    No results for input(s): "LIPASE", "AMYLASE" in the last 168 hours. No results for input(s): "AMMONIA" in the last 168 hours.  CBC: Recent Labs  Lab 03/22/22 1132 03/23/22 0438 03/26/22 0621  WBC 13.4* 6.6 7.0  HGB 10.3* 9.9* 10.9*  HCT 33.8* 32.5* 35.3*  MCV 86.2 87.4 84.0  PLT 304 249 289     Cardiac Enzymes: No results for input(s): "CKTOTAL", "CKMB", "CKMBINDEX", "TROPONINI" in the last 168 hours.  BNP: Invalid input(s): "POCBNP"  CBG: Recent Labs  Lab 03/25/22 1249 03/25/22 1647 03/25/22 2112 03/26/22 0826 03/26/22 1113  GLUCAP 123* 116* 196* 156* 77     Microbiology: Results for orders placed or performed during the hospital encounter of 03/22/22  Resp Panel by RT-PCR (Flu A&B, Covid) Anterior  Nasal Swab     Status: Abnormal   Collection Time: 03/22/22 11:32 AM   Specimen: Anterior Nasal Swab  Result Value Ref Range Status   SARS Coronavirus 2 by RT PCR POSITIVE (A) NEGATIVE Final    Comment: (NOTE) SARS-CoV-2 target nucleic acids are DETECTED.  The SARS-CoV-2 RNA is generally detectable in upper respiratory specimens during the acute phase of infection. Positive results are indicative of the presence of the identified virus, but do not rule out bacterial  infection or co-infection with other pathogens not detected by the test. Clinical correlation with patient history and other diagnostic information is necessary to determine patient infection status. The expected result is Negative.  Fact Sheet for Patients: EntrepreneurPulse.com.au  Fact Sheet for Healthcare Providers: IncredibleEmployment.be  This test is not yet approved or cleared by the [place] FDA and  has been authorized for detection and/or diagnosis of SARS-CoV-2 by FDA under an Emergency Use Authorization (EUA).  This EUA will remain in effect (meaning this test can be used) for the duration of  the COVID-19 declaration under Section 564(b)(1) of the A ct, 21 U.S.C. section 360bbb-3(b)(1), unless the authorization is terminated or revoked sooner.     Influenza A by PCR NEGATIVE NEGATIVE Final   Influenza B by PCR NEGATIVE NEGATIVE Final    Comment: (NOTE) The Xpert Xpress SARS-CoV-2/FLU/RSV plus assay is intended as an aid in the diagnosis of influenza from Nasopharyngeal swab specimens and should not be used as a sole basis for treatment. Nasal washings and aspirates are unacceptable for Xpert Xpress SARS-CoV-2/FLU/RSV testing.  Fact Sheet for Patients: EntrepreneurPulse.com.au  Fact Sheet for Healthcare Providers: IncredibleEmployment.be  This test is not yet approved or cleared by the [place] FDA and has been authorized for detection and/or diagnosis of SARS-CoV-2 by FDA under an Emergency Use Authorization (EUA). This EUA will remain in effect (meaning this test can be used) for the duration of the COVID-19 declaration under Section 564(b)(1) of the Act, 21 U.S.C. section 360bbb-3(b)(1), unless the authorization is terminated or revoked.  Performed at [facility], [address] [place], [place] [address]   Expectorated Sputum Assessment w Gram Stain, Rflx to Resp  Cult     Status: None   Collection Time: 03/24/22  8:10 PM   Specimen: Sputum  Result Value Ref Range Status   Specimen Description SPUTUM  Final   Special Requests NONE  Final   Sputum evaluation   Final    THIS SPECIMEN IS ACCEPTABLE FOR SPUTUM CULTURE Performed at [facility], [address], [place], [place] [address]    Report Status 03/24/2022 FINAL  Final  Culture, Respiratory w Gram Stain     Status: None (Preliminary result)   Collection Time: 03/24/22  8:10 PM   Specimen: SPU  Result Value Ref Range Status   Specimen Description   Final    SPUTUM Performed at [facility], [address] [place], [place] [address]    Special Requests   Final    NONE Reflexed from [id number] Performed at [facility], [address] [place], [place] [address]    Gram Stain   Final    FEW WBC PRESENT,BOTH PMN AND MONONUCLEAR FEW GRAM POSITIVE COCCI IN PAIRS IN CLUSTERS FEW GRAM POSITIVE RODS    Culture   Final    CULTURE REINCUBATED FOR BETTER GROWTH Performed at [facility], [place] [address], [place], [place] [address]    Report Status PENDING  Incomplete  Coagulation Studies: No results for input(s): "LABPROT", "INR" in the last 72 hours.    Urinalysis: No results for input(s): "COLORURINE", "LABSPEC", "PHURINE", "GLUCOSEU", "HGBUR", "BILIRUBINUR", "KETONESUR", "PROTEINUR", "UROBILINOGEN", "NITRITE", "LEUKOCYTESUR" in the last 72 hours.  Invalid input(s): "APPERANCEUR"    Imaging: No results found.   Medications:    sodium chloride     anticoagulant sodium citrate      amLODipine  10 mg Oral Daily   vitamin C  500 mg Oral Daily   aspirin EC  81 mg Oral Daily   atorvastatin  40 mg Oral Daily   calcium acetate  1,334 mg Oral TID WC   Chlorhexidine Gluconate Cloth  6 each Topical Daily   chlorproMAZINE  25 mg Oral TID   cholecalciferol  5,000 Units Oral Daily   clopidogrel  75 mg Oral Daily   donepezil  10 mg Oral QHS    gabapentin  300 mg Oral QHS   heparin injection (subcutaneous)  5,000 Units Subcutaneous Q8H   insulin aspart  0-6 Units Subcutaneous TID WC   insulin aspart  3 Units Subcutaneous TID WC   insulin detemir  15 Units Subcutaneous QHS   levothyroxine  137 mcg Oral Q0600   losartan  100 mg Oral QPM   metoprolol succinate  50 mg Oral Daily   pantoprazole  40 mg Oral Daily   predniSONE  50 mg Oral Daily   sodium chloride flush  3 mL Intravenous Q12H   zinc sulfate  220 mg Oral Daily   sodium chloride, acetaminophen **OR** acetaminophen, albuterol, alteplase, anticoagulant sodium citrate, chlorpheniramine-HYDROcodone, guaiFENesin, heparin, lidocaine (PF), lidocaine-prilocaine, ondansetron **OR** ondansetron (ZOFRAN) IV, pentafluoroprop-tetrafluoroeth, sodium chloride flush  Chest x-ray done on March 09, 2022 IMPRESSION: 1. Cardiomegaly with vascular congestion and pulmonary edema 2. Worsening airspace disease at the right greater than left lung base, atelectasis versus pneumonia       Assessment/ Plan:  [name] is a 76 y.o.  male with a past medical history of hypertension, end-stage renal disease, on hemodialysis on Monday Wednesday Friday schedule, diabetes mellitus, CVA who was brought to the ER with chief complaint of weakness.  [facility]/TTS/right chest PermCath  End-stage renal disease on hemodialysis.         Patient is on Tuesday Thursday Saturday schedule as an outpatient     Currently receiving dialysis, UF goal 1 L as tolerated.  Next treatment scheduled for Saturday.  Patient cleared to discharge from renal stance and continue outpatient dialysis at assigned clinic.    2. Anemia of chronic kidney disease  Lab Results  Component Value Date   HGB 10.9 (L) 03/26/2022    Hemoglobin above target.  3. Secondary Hyperparathyroidism: with outpatient labs: PTH 422, phosphorus 6.4, calcium 9.1 on 12/29/2021.   Lab Results  Component Value Date   CALCIUM 8.5  (L) 03/26/2022   CAION 0.83 (LL) 12/10/2020   PHOS 4.1 03/12/2022    Calcium and phosphorus acceptable.  Calcium acetate prescribed with meals.  4.  Hypertension with chronic kidney disease.  Home regimen includes hydralazine losartan, and metoprolol.   Blood pressure 145/67 during dialysis.  5. Diabetes mellitus type II with chronic kidney disease insulin dependent. Home regimen includes NovoLog and Levemir. Most recent hemoglobin A1c is 8.1 on 01/18/2022.  Currently prescribed Januvia outpatient.  Currently held.  Primary team to manage sliding scale insulin.   6.  Pneumonia with Covid 19. Receiving Remdesivir, azithromycin and solumedrol.     LOS: [place]  11/2/202312:39 PM

## 2022-03-27 ENCOUNTER — Telehealth: Payer: Self-pay | Admitting: Surgery

## 2022-03-27 ENCOUNTER — Telehealth: Payer: Self-pay | Admitting: *Deleted

## 2022-03-27 ENCOUNTER — Encounter: Payer: Self-pay | Admitting: *Deleted

## 2022-03-27 NOTE — Telephone Encounter (Signed)
This patient has an appointment with Dr Hampton Abbot 04/03/22, has a drain in place.  But when they were in the hospital they were told by Dr. Hampton Abbot that they need to go to [facility] to have the tube removed. They have no idea who to call or where to go?  Please advise. Thank you

## 2022-03-27 NOTE — Patient Outreach (Signed)
  Care Coordination [facility] Note Transition Care Management Follow-up Telephone Call Date of discharge and from where: Thursday, 03/26/22 [facility]; pneumonia secondary to COVID-19/ hypoxia/ pulmonary edema; multiple recent hospitalizations How have you been since you were released from the hospital? Per patient/ and spouse and caregiver: "Things are going okay.  He seems better.  I got his medicine and am giving it to him like they said.  So far no problems.  He refused home health, he doesn't need the exercise people coming out.  He is going to hemodialysis and has an appointment next week with the surgeon.  The drain is still in from the previous surgery, I am not sure when they are going to take it out, I guess they will tell us next week, or I might call them to ask before the appointment.  I take him to hemodialysis on Tuesday, Thursday, and Saturday-- he has not missed any hemodialysis" Any questions or concerns? No  Items Reviewed: Did the pt receive and understand the discharge instructions provided? Yes  Medications obtained and verified? Yes  Other? Yes - patient reports he has refused home health services Any new allergies since your discharge? No  Dietary orders reviewed? Yes Do you have support at home? Yes  wife/ caregiver assisting with ADL's and iADL's as needed/ indicated  Home Care and Equipment/Supplies: Were home health services ordered? yes If so, what is the name of the agency? [facility]  Has the agency set up a time to come to the patient's home? No  patient reports he has refused home health services Were any new equipment or medical supplies ordered?  No What is the name of the medical supply agency? N/A Were you able to get the supplies/equipment? not applicable Do you have any questions related to the use of the equipment or supplies? No N/A  Functional Questionnaire: (I = Independent and D = Dependent) ADLs: I  wife/ caregiver assisting with ADL's and iADL's as needed/  indicated  Bathing/Dressing- I  wife/ caregiver assisting with ADL's and iADL's as needed/ indicated  Meal Prep- D  Eating- I  Maintaining continence- I  Transferring/Ambulation- I  wife/ caregiver assisting with ADL's and iADL's as needed/ indicated  Managing Meds- I  wife/ caregiver assisting with ADL's and iADL's as needed/ indicated  Follow up appointments reviewed:  PCP Hospital f/u appt confirmed? No  Scheduled to see - on - @ [facility] f/u appt confirmed? Yes  Scheduled to see surgical provider, Dr. Hampton Abbot on Friday, 04/03/22 @ 11:00 am Are transportation arrangements needed? No  If their condition worsens, is the pt aware to call PCP or go to the Emergency Dept.? Yes Was the patient provided with contact information for the PCP's office or ED? Yes Was to pt encouraged to call back with questions or concerns? Yes  SDOH assessments and interventions completed:   Yes  Care Coordination Interventions Activated:  Yes   Care Coordination Interventions:  Referred for Care Coordination Services:  RN Care Coordinator   Encounter Outcome:  Pt. Visit Completed    Oneta Rack, RN, BSN, CCRN Alumnus RN CM Care Coordination/ Transition of [place] Management [phone]: direct office

## 2022-03-28 DIAGNOSIS — N186 End stage renal disease: Secondary | ICD-10-CM | POA: Diagnosis not present

## 2022-03-28 DIAGNOSIS — N2581 Secondary hyperparathyroidism of renal origin: Secondary | ICD-10-CM | POA: Diagnosis not present

## 2022-03-28 DIAGNOSIS — Z992 Dependence on renal dialysis: Secondary | ICD-10-CM | POA: Diagnosis not present

## 2022-03-28 LAB — CULTURE, RESPIRATORY W GRAM STAIN: Culture: NORMAL

## 2022-03-31 ENCOUNTER — Other Ambulatory Visit
Admission: RE | Admit: 2022-03-31 | Discharge: 2022-03-31 | Disposition: A | Discharge status: Home / Self Care | Payer: Medicare HMO | Source: Ambulatory Visit | Attending: Internal Medicine | Admitting: Internal Medicine

## 2022-03-31 DIAGNOSIS — N186 End stage renal disease: Secondary | ICD-10-CM | POA: Diagnosis not present

## 2022-03-31 DIAGNOSIS — Z1152 Encounter for screening for COVID-19: Secondary | ICD-10-CM | POA: Diagnosis not present

## 2022-03-31 DIAGNOSIS — R0609 Other forms of dyspnea: Secondary | ICD-10-CM | POA: Diagnosis not present

## 2022-03-31 DIAGNOSIS — Z79899 Other long term (current) drug therapy: Secondary | ICD-10-CM | POA: Insufficient documentation

## 2022-03-31 DIAGNOSIS — R0602 Shortness of breath: Secondary | ICD-10-CM | POA: Diagnosis not present

## 2022-03-31 DIAGNOSIS — E119 Type 2 diabetes mellitus without complications: Secondary | ICD-10-CM | POA: Diagnosis not present

## 2022-03-31 DIAGNOSIS — Z992 Dependence on renal dialysis: Secondary | ICD-10-CM | POA: Diagnosis not present

## 2022-03-31 DIAGNOSIS — I10 Essential (primary) hypertension: Secondary | ICD-10-CM | POA: Diagnosis not present

## 2022-03-31 DIAGNOSIS — N2581 Secondary hyperparathyroidism of renal origin: Secondary | ICD-10-CM | POA: Diagnosis not present

## 2022-03-31 DIAGNOSIS — E0849 Diabetes mellitus due to underlying condition with other diabetic neurological complication: Secondary | ICD-10-CM | POA: Diagnosis not present

## 2022-03-31 DIAGNOSIS — I214 Non-ST elevation (NSTEMI) myocardial infarction: Secondary | ICD-10-CM | POA: Insufficient documentation

## 2022-03-31 DIAGNOSIS — E782 Mixed hyperlipidemia: Secondary | ICD-10-CM | POA: Diagnosis not present

## 2022-03-31 DIAGNOSIS — Z20822 Contact with and (suspected) exposure to covid-19: Secondary | ICD-10-CM | POA: Diagnosis not present

## 2022-03-31 LAB — BRAIN NATRIURETIC PEPTIDE: B Natriuretic Peptide: 4110.7 pg/mL — ABNORMAL HIGH (ref 0.0–100.0)

## 2022-04-02 DIAGNOSIS — Z20822 Contact with and (suspected) exposure to covid-19: Secondary | ICD-10-CM | POA: Diagnosis not present

## 2022-04-02 DIAGNOSIS — Z1152 Encounter for screening for COVID-19: Secondary | ICD-10-CM | POA: Diagnosis not present

## 2022-04-02 DIAGNOSIS — N186 End stage renal disease: Secondary | ICD-10-CM | POA: Diagnosis not present

## 2022-04-02 DIAGNOSIS — Z992 Dependence on renal dialysis: Secondary | ICD-10-CM | POA: Diagnosis not present

## 2022-04-02 DIAGNOSIS — N2581 Secondary hyperparathyroidism of renal origin: Secondary | ICD-10-CM | POA: Diagnosis not present

## 2022-04-03 ENCOUNTER — Ambulatory Visit (INDEPENDENT_AMBULATORY_CARE_PROVIDER_SITE_OTHER): Payer: Medicare HMO | Admitting: Surgery

## 2022-04-03 ENCOUNTER — Encounter: Payer: Self-pay | Admitting: Surgery

## 2022-04-03 VITALS — BP 173/64 | HR 67 | Temp 98.6°F | Wt 144.6 lb

## 2022-04-03 DIAGNOSIS — K810 Acute cholecystitis: Secondary | ICD-10-CM | POA: Diagnosis not present

## 2022-04-03 NOTE — Patient Instructions (Signed)
If you have any concerns or questions, please feel free to call our office.   Cholecystitis Cholecystitis is irritation and swelling (inflammation) of the gallbladder. The gallbladder: Is an organ that is shaped like a pear. Is under the liver on the right side of the body. Stores bile. Bile helps the body break down (digest) the fats in food. This condition can occur all of a sudden. It needs to be treated. What are the causes? This condition may be caused by stones or lumps that form in the gallbladder (gallstones). Gallstones can block the tube (duct) that carries bile out of your gallbladder. Other causes include: Damage to the gallbladder due to less blood flow. Germs in the bile ducts. Scars, kinks, or adhesions in the bile ducts. Abnormal growths (tumors) in the liver, pancreas, or gallbladder. What increases the risk? You are more likely to develop this condition if: You are male and between the ages of 48-62. You take birth control pills. You use estrogen. You take certain medicines that make you more likely to develop gallstones. You are overweight (obese). You have a very bad reaction to an infection (sepsis). You have been hospitalized due to a serious condition, such as a burn or illness. You have not eaten or drank for a long time. What are the signs or symptoms? Symptoms of this condition include: Pain in the upper right part of the belly (abdomen). A lump over the gallbladder. Bloating in the belly. Feeling sick to your stomach (nauseous). Vomiting. Fever. Chills. How is this treated? This condition may be treated with: Medicines to treat pain. Giving fluids through an IV tube. Not eating or drinking (fasting). Antibiotic medicines. Surgery to take out your gallbladder. Gallbladder drainage. Follow these instructions at home: Medicines  Take over-the-counter and prescription medicines only as told by your doctor. If you were prescribed an antibiotic  medicine, take it as told by your doctor. Do not stop taking it even if you start to feel better. General instructions Follow instructions from your doctor about what to eat or drink. Do not eat or drink anything that makes you sick again. Do not smoke or use any products that contain nicotine or tobacco. If you need help quitting, ask your doctor. Keep all follow-up visits. Contact a doctor if: You have pain and your medicine does not help. You have a fever. Get help right away if: Your pain moves to: Another part of your belly. Your back. Your symptoms do not go away. You have new symptoms. These symptoms may be an emergency. Get help right away. Call 911. Do not wait to see if the symptoms will go away. Do not drive yourself to the hospital. Summary This condition may be caused by stones or lumps that form in the gallbladder (gallstones). A common symptom is pain in your belly. This condition may be treated with surgery to take out your gallbladder. Follow instructions from your doctor about what to eat or drink. This information is not intended to replace advice given to you by your health care provider. Make sure you discuss any questions you have with your health care provider. Document Revised: 11/12/2020 Document Reviewed: 11/12/2020 Elsevier Patient Education  [place].

## 2022-04-04 ENCOUNTER — Encounter: Payer: Self-pay | Admitting: Surgery

## 2022-04-04 DIAGNOSIS — Z992 Dependence on renal dialysis: Secondary | ICD-10-CM | POA: Diagnosis not present

## 2022-04-04 DIAGNOSIS — N186 End stage renal disease: Secondary | ICD-10-CM | POA: Diagnosis not present

## 2022-04-04 DIAGNOSIS — N2581 Secondary hyperparathyroidism of renal origin: Secondary | ICD-10-CM | POA: Diagnosis not present

## 2022-04-04 NOTE — Progress Notes (Signed)
04/03/2022  History of Present Illness: Adrienne Delay is a 76 y.o. male presenting for follow-up of acute cholecystitis.  The patient has a history of acute cholecystitis diagnosed on 01/20/2022 following CT scan.  Due to other medical issues at the time, he had a percutaneous cholecystostomy drain placed on 01/21/2022.  Unfortunately he has had different complications afterwards with a bronchitis early in October, NSTEMI around mid October, and most recently COVID infection at the end of October.  He did have a cholangiogram on 03/18/2022 which showed a patent cystic duct and CBD.  At that time he was offered to cap the drain but he refused at that point.  During his admission with COVID, the drain was capped.  Since then, the patient reports that he has been doing well and denies any abdominal pain issues.  He is recovering from [place] and denies any shortness of breath or coughing at this point.  He is interested in having his drain removed.  Past Medical History: Past Medical History:  Diagnosis Date   Clotting disorder ([place])    per daughter, "when patient travels, his feet swell"   Diabetes mellitus without complication ([place])    type II   Diabetic eye exam ([place]) 12/2019   ESRD (end stage renal disease) ([place])    MWF- [place]   GERD (gastroesophageal reflux disease)    [name] (hard of hearing)    Hyperlipidemia    Hypertension    Hypothyroidism    Thyroid disease      Past Surgical History: Past Surgical History:  Procedure Laterality Date   AV FISTULA PLACEMENT Right 10/22/2017   Procedure: ARTERIOVENOUS (AV) BRACHIOCEPHALIC FISTULA CREATION RIGHT UPPER ARM;  Surgeon: Conrad [place], MD;  Location: [facility];  Service: Vascular;  Laterality: Right;   AV FISTULA PLACEMENT Left 02/03/2019   Procedure: INSERTION OF ARTERIOVENOUS (AV) GORE-TEX GRAFT ARM (BRACHIAL AXILLARY );  Surgeon: [name], MD;  Location: [facility];  Service: Vascular;  Laterality: Left;   AV FISTULA PLACEMENT Left  12/10/2020   Procedure: LEFT ARM BRACHIOCEPHALIC ARTERIOVENOUS (AV) FISTULA CREATION;  Surgeon: [name], MD;  Location: [facility];  Service: Vascular;  Laterality: Left;   IR CHOLANGIOGRAM EXISTING TUBE  03/18/2022   IR PERC CHOLECYSTOSTOMY  01/21/2022   UPPER EXTREMITY ANGIOGRAPHY Left 09/19/2019   Procedure: UPPER EXTREMITY ANGIOGRAPHY;  Surgeon: [name], MD;  Location: [facility];  Service: Cardiovascular;  Laterality: Left;    Home Medications: Prior to Admission medications   Medication Sig Start Date End Date Taking? Authorizing Provider  albuterol (PROVENTIL) (2.5 MG/3ML) 0.083% nebulizer solution Take 3 mLs (2.5 mg total) by nebulization every 6 (six) hours as needed for wheezing or shortness of breath. 03/13/22  Yes Lorella Nimrod, MD  amLODipine (NORVASC) 10 MG tablet Take 1 tablet (10 mg total) by mouth daily. 02/02/22  Yes Fritzi Mandes, MD  aspirin EC 81 MG tablet Take 81 mg by mouth daily. Swallow whole.   Yes [provider]  atorvastatin (LIPITOR) 40 MG tablet Take 1 tablet (40 mg total) by mouth daily. 03/14/22  Yes Lorella Nimrod, MD  B Complex-C-Folic Acid (RENA-VITE RX) 1 MG TABS Take 1 tablet by mouth daily.    Yes [provider]  BD PEN NEEDLE NANO 2ND GEN 32G X 4 MM MISC USE AS DIRECTED 02/26/22  Yes Masoud, Viann Shove, MD  calcium acetate (PHOSLO) 667 MG capsule TAKE 2 CAPSULES BY MOUTH 3 TIMES A DAY WITH MEAL 02/16/22  Yes  [name], MD  chlorproMAZINE (THORAZINE) 25 MG tablet TAKE 1 TABLET BY MOUTH THREE TIMES A DAY 02/27/22  Yes Masoud, Viann Shove, MD  Cholecalciferol (VITAMIN D3) 5000 units TABS Take 5,000 Units by mouth daily.    Yes [provider]  clopidogrel (PLAVIX) 75 MG tablet Take 1 tablet (75 mg total) by mouth daily. 03/14/22  Yes Lorella Nimrod, MD  Continuous Blood Gluc Sensor (FREESTYLE LIBRE 14 DAY SENSOR) MISC APPLY EVERY 14 (FOURTEEN) DAYS. 02/02/22  Yes Masoud, Viann Shove, MD  donepezil (ARICEPT) 10 MG tablet TAKE 1  TABLET BY MOUTH EVERY DAY 04/22/21  Yes Masoud, Viann Shove, MD  gabapentin (NEURONTIN) 300 MG capsule TAKE 1 CAPSULE BY MOUTH EVERY DAY AT BEDTIME AS DIRECTED 11/05/21  Yes Masoud, Viann Shove, MD  insulin detemir (LEVEMIR) 100 UNIT/ML injection Inject 0.15 mLs (15 Units total) into the skin at bedtime. 02/02/22  Yes Fritzi Mandes, MD  ipratropium-albuterol (DUONEB) 0.5-2.5 (3) MG/3ML SOLN Take 3 mLs by nebulization every 6 (six) hours as needed. 03/05/22  Yes Masoud, Viann Shove, MD  levothyroxine (SYNTHROID) 137 MCG tablet TAKE 1 TABLET BY MOUTH EVERY DAY 03/24/21  Yes Masoud, Viann Shove, MD  losartan (COZAAR) 100 MG tablet Take 1 tablet (100 mg total) by mouth every evening. 02/02/22  Yes Fritzi Mandes, MD  metoprolol succinate (TOPROL-XL) 50 MG 24 hr tablet TAKE 1 TABLET BY MOUTH EVERY DAY 01/23/22  Yes Masoud, Viann Shove, MD  Multiple Vitamins-Minerals (MULTIVITAMIN WITH MINERALS) tablet Take 1 tablet by mouth daily.   Yes [provider]  pantoprazole (PROTONIX) 40 MG tablet TAKE 1 TABLET BY MOUTH EVERY DAY 07/09/21  Yes Masoud, Viann Shove, MD  predniSONE (DELTASONE) 10 MG tablet '40mg'$  daily x 1 day, '30mg'$  daily x 1 day, '20mg'$  daily x 1 day, '10mg'$  daily x 1 day then stop 03/26/22  Yes Wyvonnia Dusky, MD  sevelamer carbonate (RENVELA) 2.4 g PACK Take 2.4 g by mouth daily. 02/25/22  Yes Masoud, Viann Shove, MD  sitaGLIPtin (JANUVIA) 50 MG tablet Take 50 mg by mouth every evening.    Yes [provider]  sodium chloride flush (NS) 0.9 % SOLN Inject 5 mLs into the vein daily. Flush cholecystostomy tube daily with 5 ccs of NS 03/26/22 05/25/22 Yes Wyvonnia Dusky, MD    Allergies: No Known Allergies  Review of Systems: Review of Systems  Constitutional:  Negative for chills and fever.  Respiratory:  Negative for shortness of breath.   Cardiovascular:  Negative for chest pain.  Gastrointestinal:  Negative for abdominal pain, nausea and vomiting.    Physical Exam BP (!) 173/64   Pulse 67   Temp 98.6 F (37 C) (Oral)    Wt 144 lb 9.6 oz (65.6 kg)   SpO2 98%   BMI 24.82 kg/m  CONSTITUTIONAL: No acute distress, well-nourished HEENT:  Normocephalic, atraumatic, extraocular motion intact. RESPIRATORY:  Normal respiratory effort without pathologic use of accessory muscles. CARDIOVASCULAR: Regular rhythm and rate. GI: The abdomen is soft, nondistended, nontender to palpation.  Right upper quadrant percutaneous cholecystostomy drain is capped without any evidence of pain or complications with the drain itself.  At the patient's request, the drain was removed.  Dry gauze dressing was applied.  No complications.  NEUROLOGIC:  Motor and sensation is grossly normal.  Cranial nerves are grossly intact. PSYCH:  Alert and oriented to person, place and time. Affect is normal.  Labs/Imaging: Cholangiogram on 03/18/2022: IMPRESSION: 1. Cholecystostomy tube is slightly retracted but still appropriately positioned within the gallbladder. 2. Gallbladder is decompressed. Cystic duct  and common bile duct are widely patent. Discussed a capping trial with the patient and wife but they preferred to continue with gravity drainage. Patient is scheduled to follow-up with general surgery.  Assessment and Plan: This is a 76 y.o. male with a history of acute cholecystitis, status post percutaneous cholecystostomy drain placement.  - The patient is doing well and denies any abdominal pain, nausea, vomiting with his drain capped since about 2 weeks ago.  The cholangiogram that he had towards the end of October showed a patent cystic duct and common bile duct.  The patient request for the drain to be removed.  I discussed with him and his wife again the potential options for removing the drain today which were unfortunately bring his risk back for having acute cholecystitis in the future.  The other potential option is keeping the drain That is until he is cleared from a cardiology standpoint so he can undergo surgery and at that time the  drain could be removed at the same time of surgery.  However the patient has decided to have his drain removed today.  Drain was removed without any complications and dry gauze dressing was applied.  Instructed on daily dressing changes until the wound is fully closed. - Patient will contact us once he is cleared from the cardiology standpoint so that we could discuss more about elective outpatient surgery.  I imagine this will be still in a few more months at least given his NSTEMI.  Return precautions were also given but if he were to have again episodes of right upper quadrant pain, nausea, vomiting, fevers, chills. - Follow-up as needed.  I spent 30 minutes dedicated to the care of this patient on the date of this encounter to include pre-visit review of records, face-to-face time with the patient discussing diagnosis and management, and any post-visit coordination of care.   Melvyn Neth, [facility]

## 2022-04-07 DIAGNOSIS — N186 End stage renal disease: Secondary | ICD-10-CM | POA: Diagnosis not present

## 2022-04-07 DIAGNOSIS — Z20822 Contact with and (suspected) exposure to covid-19: Secondary | ICD-10-CM | POA: Diagnosis not present

## 2022-04-07 DIAGNOSIS — Z992 Dependence on renal dialysis: Secondary | ICD-10-CM | POA: Diagnosis not present

## 2022-04-07 DIAGNOSIS — Z1152 Encounter for screening for COVID-19: Secondary | ICD-10-CM | POA: Diagnosis not present

## 2022-04-07 DIAGNOSIS — N2581 Secondary hyperparathyroidism of renal origin: Secondary | ICD-10-CM | POA: Diagnosis not present

## 2022-04-08 ENCOUNTER — Ambulatory Visit: Payer: Self-pay

## 2022-04-08 NOTE — Patient Outreach (Signed)
  Care Coordination   Follow Up Visit Note   04/08/2022 Name: Joseph Mcpherson MRN: [id number] DOB: 05-28-1945  Joseph Mcpherson is a 76 y.o. year old male who sees [name], MD for primary care. I  spoke with patients spouse, Joseph Mcpherson.    What matters to the patients health and wellness today?  Spouse states patient is doing well. She states patient had his drain removed/ follow up appointment with surgeon on 04/03/22.  Denies any signs/ symptoms of infection.  She states patient is receiving physical therapy 2x per week with home health.   She states he is walking/ getting around well.  Spouse states patient has his medications and takes them as prescribed.  She state she provides patient transportation to his appointments.  Spouse states patient is scheduled for a heart test next week.  Spouse states patient is doing fine.  She states she does not feel a nurse needs to call and follow up with patient because he is doing " fine."   RNCM offered to call back and follow up with patient and/ or spouse in two months.  Wife agreed.       Goals Addressed             This Visit's Progress    Care coordination activities       Care Coordination Intervention Evaluation of current treatment plan related to status post percutaneous cholecystostomy drain placement  and patient's adherence to plan as established by provider Reviewed medications with spouse and discussed importance of compliance Reviewed scheduled/upcoming provider appointments I Discussed signs/ symptoms of infection at drain site.  Advised to notify provider for these symptoms.  Explained and offered ongoing follow up with RNCM for care coordination          SDOH assessments and interventions completed:  No     Care Coordination Interventions Activated:  Yes  Care Coordination Interventions:  Yes, provided   Follow up plan: Follow up call scheduled for 06/03/21    Encounter Outcome:  Pt. Visit Completed   Quinn Plowman  RN,BSN,CCM [place] [phone] direct line

## 2022-04-09 DIAGNOSIS — Z992 Dependence on renal dialysis: Secondary | ICD-10-CM | POA: Diagnosis not present

## 2022-04-09 DIAGNOSIS — N2581 Secondary hyperparathyroidism of renal origin: Secondary | ICD-10-CM | POA: Diagnosis not present

## 2022-04-09 DIAGNOSIS — N186 End stage renal disease: Secondary | ICD-10-CM | POA: Diagnosis not present

## 2022-04-10 ENCOUNTER — Other Ambulatory Visit: Payer: Self-pay

## 2022-04-10 ENCOUNTER — Emergency Department: Payer: Medicare HMO

## 2022-04-10 ENCOUNTER — Inpatient Hospital Stay
Admission: EM | Admit: 2022-04-10 | Discharge: 2022-04-16 | DRG: 291 | Disposition: A | Discharge status: Home Health | Payer: Medicare HMO | Attending: Internal Medicine | Admitting: Internal Medicine

## 2022-04-10 DIAGNOSIS — R0689 Other abnormalities of breathing: Secondary | ICD-10-CM | POA: Diagnosis not present

## 2022-04-10 DIAGNOSIS — R9401 Abnormal electroencephalogram [EEG]: Secondary | ICD-10-CM | POA: Insufficient documentation

## 2022-04-10 DIAGNOSIS — G253 Myoclonus: Secondary | ICD-10-CM | POA: Diagnosis not present

## 2022-04-10 DIAGNOSIS — Z83438 Family history of other disorder of lipoprotein metabolism and other lipidemia: Secondary | ICD-10-CM

## 2022-04-10 DIAGNOSIS — Z79899 Other long term (current) drug therapy: Secondary | ICD-10-CM | POA: Diagnosis not present

## 2022-04-10 DIAGNOSIS — E1140 Type 2 diabetes mellitus with diabetic neuropathy, unspecified: Secondary | ICD-10-CM | POA: Diagnosis present

## 2022-04-10 DIAGNOSIS — J9601 Acute respiratory failure with hypoxia: Secondary | ICD-10-CM | POA: Diagnosis present

## 2022-04-10 DIAGNOSIS — I160 Hypertensive urgency: Secondary | ICD-10-CM | POA: Diagnosis not present

## 2022-04-10 DIAGNOSIS — J189 Pneumonia, unspecified organism: Principal | ICD-10-CM

## 2022-04-10 DIAGNOSIS — Z794 Long term (current) use of insulin: Secondary | ICD-10-CM | POA: Diagnosis not present

## 2022-04-10 DIAGNOSIS — N186 End stage renal disease: Secondary | ICD-10-CM | POA: Diagnosis not present

## 2022-04-10 DIAGNOSIS — E039 Hypothyroidism, unspecified: Secondary | ICD-10-CM | POA: Diagnosis not present

## 2022-04-10 DIAGNOSIS — I2489 Other forms of acute ischemic heart disease: Secondary | ICD-10-CM | POA: Diagnosis not present

## 2022-04-10 DIAGNOSIS — I5023 Acute on chronic systolic (congestive) heart failure: Secondary | ICD-10-CM | POA: Diagnosis not present

## 2022-04-10 DIAGNOSIS — E1165 Type 2 diabetes mellitus with hyperglycemia: Secondary | ICD-10-CM | POA: Diagnosis present

## 2022-04-10 DIAGNOSIS — Z7902 Long term (current) use of antithrombotics/antiplatelets: Secondary | ICD-10-CM

## 2022-04-10 DIAGNOSIS — Z833 Family history of diabetes mellitus: Secondary | ICD-10-CM

## 2022-04-10 DIAGNOSIS — I2583 Coronary atherosclerosis due to lipid rich plaque: Secondary | ICD-10-CM

## 2022-04-10 DIAGNOSIS — R569 Unspecified convulsions: Secondary | ICD-10-CM | POA: Diagnosis not present

## 2022-04-10 DIAGNOSIS — Z7982 Long term (current) use of aspirin: Secondary | ICD-10-CM

## 2022-04-10 DIAGNOSIS — Z8616 Personal history of COVID-19: Secondary | ICD-10-CM | POA: Diagnosis not present

## 2022-04-10 DIAGNOSIS — E782 Mixed hyperlipidemia: Secondary | ICD-10-CM | POA: Diagnosis present

## 2022-04-10 DIAGNOSIS — I120 Hypertensive chronic kidney disease with stage 5 chronic kidney disease or end stage renal disease: Secondary | ICD-10-CM | POA: Diagnosis not present

## 2022-04-10 DIAGNOSIS — G9341 Metabolic encephalopathy: Secondary | ICD-10-CM | POA: Diagnosis present

## 2022-04-10 DIAGNOSIS — F03A0 Unspecified dementia, mild, without behavioral disturbance, psychotic disturbance, mood disturbance, and anxiety: Secondary | ICD-10-CM | POA: Diagnosis not present

## 2022-04-10 DIAGNOSIS — D72829 Elevated white blood cell count, unspecified: Secondary | ICD-10-CM | POA: Diagnosis not present

## 2022-04-10 DIAGNOSIS — D631 Anemia in chronic kidney disease: Secondary | ICD-10-CM | POA: Insufficient documentation

## 2022-04-10 DIAGNOSIS — R0902 Hypoxemia: Secondary | ICD-10-CM

## 2022-04-10 DIAGNOSIS — J9621 Acute and chronic respiratory failure with hypoxia: Secondary | ICD-10-CM | POA: Diagnosis not present

## 2022-04-10 DIAGNOSIS — I959 Hypotension, unspecified: Secondary | ICD-10-CM | POA: Diagnosis not present

## 2022-04-10 DIAGNOSIS — I132 Hypertensive heart and chronic kidney disease with heart failure and with stage 5 chronic kidney disease, or end stage renal disease: Secondary | ICD-10-CM | POA: Diagnosis not present

## 2022-04-10 DIAGNOSIS — Z1152 Encounter for screening for COVID-19: Secondary | ICD-10-CM

## 2022-04-10 DIAGNOSIS — G9389 Other specified disorders of brain: Secondary | ICD-10-CM | POA: Diagnosis present

## 2022-04-10 DIAGNOSIS — I509 Heart failure, unspecified: Secondary | ICD-10-CM | POA: Diagnosis not present

## 2022-04-10 DIAGNOSIS — Z992 Dependence on renal dialysis: Secondary | ICD-10-CM | POA: Diagnosis not present

## 2022-04-10 DIAGNOSIS — I5021 Acute systolic (congestive) heart failure: Secondary | ICD-10-CM | POA: Diagnosis not present

## 2022-04-10 DIAGNOSIS — F0390 Unspecified dementia without behavioral disturbance: Secondary | ICD-10-CM | POA: Diagnosis present

## 2022-04-10 DIAGNOSIS — I252 Old myocardial infarction: Secondary | ICD-10-CM

## 2022-04-10 DIAGNOSIS — N189 Chronic kidney disease, unspecified: Secondary | ICD-10-CM | POA: Insufficient documentation

## 2022-04-10 DIAGNOSIS — I10 Essential (primary) hypertension: Secondary | ICD-10-CM | POA: Diagnosis present

## 2022-04-10 DIAGNOSIS — J90 Pleural effusion, not elsewhere classified: Secondary | ICD-10-CM | POA: Diagnosis not present

## 2022-04-10 DIAGNOSIS — E875 Hyperkalemia: Secondary | ICD-10-CM | POA: Diagnosis not present

## 2022-04-10 DIAGNOSIS — E119 Type 2 diabetes mellitus without complications: Secondary | ICD-10-CM

## 2022-04-10 DIAGNOSIS — E1122 Type 2 diabetes mellitus with diabetic chronic kidney disease: Secondary | ICD-10-CM | POA: Diagnosis not present

## 2022-04-10 DIAGNOSIS — I2510 Atherosclerotic heart disease of native coronary artery without angina pectoris: Secondary | ICD-10-CM | POA: Diagnosis present

## 2022-04-10 DIAGNOSIS — Z79890 Hormone replacement therapy (postmenopausal): Secondary | ICD-10-CM

## 2022-04-10 DIAGNOSIS — R739 Hyperglycemia, unspecified: Secondary | ICD-10-CM | POA: Diagnosis not present

## 2022-04-10 DIAGNOSIS — K219 Gastro-esophageal reflux disease without esophagitis: Secondary | ICD-10-CM | POA: Diagnosis present

## 2022-04-10 DIAGNOSIS — R0602 Shortness of breath: Secondary | ICD-10-CM

## 2022-04-10 DIAGNOSIS — N2581 Secondary hyperparathyroidism of renal origin: Secondary | ICD-10-CM | POA: Diagnosis not present

## 2022-04-10 DIAGNOSIS — G319 Degenerative disease of nervous system, unspecified: Secondary | ICD-10-CM | POA: Diagnosis not present

## 2022-04-10 DIAGNOSIS — J9691 Respiratory failure, unspecified with hypoxia: Secondary | ICD-10-CM | POA: Diagnosis present

## 2022-04-10 DIAGNOSIS — Z7984 Long term (current) use of oral hypoglycemic drugs: Secondary | ICD-10-CM

## 2022-04-10 DIAGNOSIS — R4182 Altered mental status, unspecified: Secondary | ICD-10-CM | POA: Diagnosis not present

## 2022-04-10 DIAGNOSIS — R7989 Other specified abnormal findings of blood chemistry: Secondary | ICD-10-CM | POA: Diagnosis not present

## 2022-04-10 DIAGNOSIS — Z8249 Family history of ischemic heart disease and other diseases of the circulatory system: Secondary | ICD-10-CM

## 2022-04-10 DIAGNOSIS — Z8673 Personal history of transient ischemic attack (TIA), and cerebral infarction without residual deficits: Secondary | ICD-10-CM

## 2022-04-10 LAB — CBC WITH DIFFERENTIAL/PLATELET
Abs Immature Granulocytes: 0.1 10*3/uL — ABNORMAL HIGH (ref 0.00–0.07)
Basophils Absolute: 0.1 10*3/uL (ref 0.0–0.1)
Basophils Relative: 1 %
Eosinophils Absolute: 0.1 10*3/uL (ref 0.0–0.5)
Eosinophils Relative: 1 %
HCT: 33.3 % — ABNORMAL LOW (ref 39.0–52.0)
Hemoglobin: 10.1 g/dL — ABNORMAL LOW (ref 13.0–17.0)
Immature Granulocytes: 1 %
Lymphocytes Relative: 5 %
Lymphs Abs: 0.8 10*3/uL (ref 0.7–4.0)
MCH: 25.8 pg — ABNORMAL LOW (ref 26.0–34.0)
MCHC: 30.3 g/dL (ref 30.0–36.0)
MCV: 85.2 fL (ref 80.0–100.0)
Monocytes Absolute: 0.6 10*3/uL (ref 0.1–1.0)
Monocytes Relative: 4 %
Neutro Abs: 15.6 10*3/uL — ABNORMAL HIGH (ref 1.7–7.7)
Neutrophils Relative %: 88 %
Platelets: 184 10*3/uL (ref 150–400)
RBC: 3.91 MIL/uL — ABNORMAL LOW (ref 4.22–5.81)
RDW: 17.3 % — ABNORMAL HIGH (ref 11.5–15.5)
WBC: 17.3 10*3/uL — ABNORMAL HIGH (ref 4.0–10.5)
nRBC: 0 % (ref 0.0–0.2)

## 2022-04-10 LAB — CBC
HCT: 31.3 % — ABNORMAL LOW (ref 39.0–52.0)
Hemoglobin: 9.6 g/dL — ABNORMAL LOW (ref 13.0–17.0)
MCH: 26.2 pg (ref 26.0–34.0)
MCHC: 30.7 g/dL (ref 30.0–36.0)
MCV: 85.5 fL (ref 80.0–100.0)
Platelets: 181 10*3/uL (ref 150–400)
RBC: 3.66 MIL/uL — ABNORMAL LOW (ref 4.22–5.81)
RDW: 17.3 % — ABNORMAL HIGH (ref 11.5–15.5)
WBC: 13.5 10*3/uL — ABNORMAL HIGH (ref 4.0–10.5)
nRBC: 0 % (ref 0.0–0.2)

## 2022-04-10 LAB — CREATININE, SERUM
Creatinine, Ser: 6.33 mg/dL — ABNORMAL HIGH (ref 0.61–1.24)
GFR, Estimated: 9 mL/min — ABNORMAL LOW (ref >60)

## 2022-04-10 LAB — BASIC METABOLIC PANEL
Anion gap: 10 (ref 5–15)
BUN: 23 mg/dL (ref 8–23)
CO2: 26 mmol/L (ref 22–32)
Calcium: 8.9 mg/dL (ref 8.9–10.3)
Chloride: 101 mmol/L (ref 98–111)
Creatinine, Ser: 6.31 mg/dL — ABNORMAL HIGH (ref 0.61–1.24)
GFR, Estimated: 9 mL/min — ABNORMAL LOW (ref >60)
Glucose, Bld: 332 mg/dL — ABNORMAL HIGH (ref 70–99)
Potassium: 4.8 mmol/L (ref 3.5–5.1)
Sodium: 137 mmol/L (ref 135–145)

## 2022-04-10 LAB — TROPONIN I (HIGH SENSITIVITY)
Troponin I (High Sensitivity): 37 ng/L — ABNORMAL HIGH (ref <18)
Troponin I (High Sensitivity): 98 ng/L — ABNORMAL HIGH (ref <18)
Troponin I (High Sensitivity): 310 ng/L (ref <18)

## 2022-04-10 LAB — MRSA NEXT GEN BY PCR, NASAL: MRSA by PCR Next Gen: NOT DETECTED

## 2022-04-10 LAB — RESP PANEL BY RT-PCR (FLU A&B, COVID) ARPGX2
Influenza A by PCR: NEGATIVE
Influenza B by PCR: NEGATIVE
SARS Coronavirus 2 by RT PCR: NEGATIVE

## 2022-04-10 LAB — LACTIC ACID, PLASMA
Lactic Acid, Venous: 0.8 mmol/L (ref 0.5–1.9)
Lactic Acid, Venous: 0.7 mmol/L (ref 0.5–1.9)

## 2022-04-10 LAB — GLUCOSE, CAPILLARY: Glucose-Capillary: 293 mg/dL — ABNORMAL HIGH (ref 70–99)

## 2022-04-10 LAB — PROCALCITONIN: Procalcitonin: 0.18 ng/mL

## 2022-04-10 MED ORDER — SODIUM CHLORIDE 0.9 % IV SOLN
1.0000 g | INTRAVENOUS | Status: DC
  Administered 2022-04-11: 1 g via INTRAVENOUS
  Filled 2022-04-10: qty 1
  Filled 2022-04-10 (×2): qty 10

## 2022-04-10 MED ORDER — ACETAMINOPHEN 325 MG PO TABS: 650.0000 mg | ORAL_TABLET | Freq: Four times a day (QID) | ORAL | Status: DC | PRN

## 2022-04-10 MED ORDER — RENA-VITE PO TABS
1.0000 | ORAL_TABLET | Freq: Every day | ORAL | Status: DC
  Administered 2022-04-11 – 2022-04-16 (×4): 1 via ORAL
  Filled 2022-04-10 (×6): qty 1

## 2022-04-10 MED ORDER — ONDANSETRON HCL 4 MG PO TABS: 4.0000 mg | ORAL_TABLET | Freq: Four times a day (QID) | ORAL | Status: DC | PRN

## 2022-04-10 MED ORDER — INSULIN GLARGINE-YFGN 100 UNIT/ML ~~LOC~~ SOLN
10.0000 [IU] | Freq: Every day | SUBCUTANEOUS | Status: DC
  Administered 2022-04-10 – 2022-04-15 (×5): 10 [IU] via SUBCUTANEOUS
  Filled 2022-04-10 (×7): qty 0.1

## 2022-04-10 MED ORDER — METOPROLOL SUCCINATE ER 50 MG PO TB24
50.0000 mg | ORAL_TABLET | Freq: Every day | ORAL | Status: DC
  Administered 2022-04-10 – 2022-04-14 (×3): 50 mg via ORAL
  Filled 2022-04-10 (×3): qty 1

## 2022-04-10 MED ORDER — ONDANSETRON HCL 4 MG/2ML IJ SOLN: 4.0000 mg | Freq: Four times a day (QID) | INTRAMUSCULAR | Status: DC | PRN

## 2022-04-10 MED ORDER — ATORVASTATIN CALCIUM 20 MG PO TABS
40.0000 mg | ORAL_TABLET | Freq: Every day | ORAL | Status: DC
  Administered 2022-04-10 – 2022-04-16 (×5): 40 mg via ORAL
  Filled 2022-04-10 (×5): qty 2

## 2022-04-10 MED ORDER — CLOPIDOGREL BISULFATE 75 MG PO TABS
75.0000 mg | ORAL_TABLET | Freq: Every day | ORAL | Status: DC
  Administered 2022-04-10 – 2022-04-16 (×6): 75 mg via ORAL
  Filled 2022-04-10 (×6): qty 1

## 2022-04-10 MED ORDER — VANCOMYCIN HCL IN DEXTROSE 1-5 GM/200ML-% IV SOLN
1000.0000 mg | Freq: Once | INTRAVENOUS | Status: DC
  Filled 2022-04-10: qty 200

## 2022-04-10 MED ORDER — FUROSEMIDE 10 MG/ML IJ SOLN
40.0000 mg | Freq: Once | INTRAMUSCULAR | Status: AC
  Administered 2022-04-10: 40 mg via INTRAVENOUS
  Filled 2022-04-10: qty 4

## 2022-04-10 MED ORDER — IPRATROPIUM-ALBUTEROL 0.5-2.5 (3) MG/3ML IN SOLN: 3.0000 mL | RESPIRATORY_TRACT | Status: DC | PRN

## 2022-04-10 MED ORDER — INSULIN ASPART 100 UNIT/ML IJ SOLN
2.0000 [IU] | Freq: Three times a day (TID) | INTRAMUSCULAR | Status: DC
  Administered 2022-04-11 – 2022-04-16 (×8): 2 [IU] via SUBCUTANEOUS
  Filled 2022-04-10 (×8): qty 1

## 2022-04-10 MED ORDER — CHLORPROMAZINE HCL 25 MG PO TABS
25.0000 mg | ORAL_TABLET | Freq: Three times a day (TID) | ORAL | Status: DC
  Administered 2022-04-10 – 2022-04-12 (×7): 25 mg via ORAL
  Filled 2022-04-10 (×8): qty 1

## 2022-04-10 MED ORDER — GABAPENTIN 300 MG PO CAPS
300.0000 mg | ORAL_CAPSULE | Freq: Every day | ORAL | Status: DC
  Administered 2022-04-10 – 2022-04-12 (×3): 300 mg via ORAL
  Filled 2022-04-10 (×3): qty 1

## 2022-04-10 MED ORDER — METOPROLOL TARTRATE 5 MG/5ML IV SOLN: 5.0000 mg | Freq: Four times a day (QID) | INTRAVENOUS | Status: DC | PRN

## 2022-04-10 MED ORDER — LEVOTHYROXINE SODIUM 137 MCG PO TABS
137.0000 ug | ORAL_TABLET | Freq: Every day | ORAL | Status: DC
  Administered 2022-04-11: 137 ug via ORAL
  Filled 2022-04-10: qty 1

## 2022-04-10 MED ORDER — VITAMIN D 25 MCG (1000 UNIT) PO TABS
5000.0000 [IU] | ORAL_TABLET | Freq: Every day | ORAL | Status: DC
  Administered 2022-04-11 – 2022-04-16 (×4): 5000 [IU] via ORAL
  Filled 2022-04-10 (×4): qty 5

## 2022-04-10 MED ORDER — ACETAMINOPHEN 650 MG RE SUPP: 650.0000 mg | Freq: Four times a day (QID) | RECTAL | Status: DC | PRN

## 2022-04-10 MED ORDER — VANCOMYCIN HCL 1250 MG/250ML IV SOLN
1250.0000 mg | Freq: Once | INTRAVENOUS | Status: AC
  Administered 2022-04-10: 1250 mg via INTRAVENOUS
  Filled 2022-04-10: qty 250

## 2022-04-10 MED ORDER — INSULIN ASPART 100 UNIT/ML IJ SOLN
0.0000 [IU] | Freq: Three times a day (TID) | INTRAMUSCULAR | Status: DC
  Administered 2022-04-11 (×2): 1 [IU] via SUBCUTANEOUS
  Administered 2022-04-12: 2 [IU] via SUBCUTANEOUS
  Administered 2022-04-12 – 2022-04-14 (×4): 1 [IU] via SUBCUTANEOUS
  Administered 2022-04-15 – 2022-04-16 (×2): 2 [IU] via SUBCUTANEOUS
  Filled 2022-04-10 (×9): qty 1

## 2022-04-10 MED ORDER — VANCOMYCIN VARIABLE DOSE PER UNSTABLE RENAL FUNCTION (PHARMACIST DOSING): Status: DC

## 2022-04-10 MED ORDER — AMLODIPINE BESYLATE 10 MG PO TABS
10.0000 mg | ORAL_TABLET | Freq: Every day | ORAL | Status: DC
  Administered 2022-04-10: 10 mg via ORAL
  Filled 2022-04-10: qty 2

## 2022-04-10 MED ORDER — ASPIRIN 81 MG PO TBEC
81.0000 mg | DELAYED_RELEASE_TABLET | Freq: Every day | ORAL | Status: DC
  Administered 2022-04-11 – 2022-04-16 (×5): 81 mg via ORAL
  Filled 2022-04-10 (×5): qty 1

## 2022-04-10 MED ORDER — MULTI-VITAMIN/MINERALS PO TABS: 1.0000 | ORAL_TABLET | Freq: Every day | ORAL | Status: DC

## 2022-04-10 MED ORDER — LOSARTAN POTASSIUM 50 MG PO TABS
100.0000 mg | ORAL_TABLET | Freq: Every evening | ORAL | Status: DC
  Administered 2022-04-10 – 2022-04-15 (×6): 100 mg via ORAL
  Filled 2022-04-10 (×6): qty 2

## 2022-04-10 MED ORDER — CALCIUM ACETATE (PHOS BINDER) 667 MG PO CAPS
1334.0000 mg | ORAL_CAPSULE | Freq: Three times a day (TID) | ORAL | Status: DC
  Administered 2022-04-11 – 2022-04-16 (×10): 1334 mg via ORAL
  Filled 2022-04-10 (×12): qty 2

## 2022-04-10 MED ORDER — PANTOPRAZOLE SODIUM 40 MG PO TBEC
40.0000 mg | DELAYED_RELEASE_TABLET | Freq: Every day | ORAL | Status: DC
  Administered 2022-04-10 – 2022-04-16 (×5): 40 mg via ORAL
  Filled 2022-04-10 (×5): qty 1

## 2022-04-10 MED ORDER — INSULIN ASPART 100 UNIT/ML IJ SOLN
0.0000 [IU] | Freq: Every day | INTRAMUSCULAR | Status: DC
  Administered 2022-04-10: 3 [IU] via SUBCUTANEOUS
  Administered 2022-04-12: 1 [IU] via SUBCUTANEOUS

## 2022-04-10 MED ORDER — DONEPEZIL HCL 5 MG PO TABS
10.0000 mg | ORAL_TABLET | Freq: Every day | ORAL | Status: DC
  Administered 2022-04-10 – 2022-04-16 (×6): 10 mg via ORAL
  Filled 2022-04-10 (×7): qty 2

## 2022-04-10 MED ORDER — SEVELAMER CARBONATE 2.4 G PO PACK
2.4000 g | PACK | Freq: Every day | ORAL | Status: DC
  Administered 2022-04-11 – 2022-04-16 (×3): 2.4 g via ORAL
  Filled 2022-04-10 (×6): qty 1

## 2022-04-10 MED ORDER — SODIUM CHLORIDE 0.9 % IV SOLN
2.0000 g | Freq: Once | INTRAVENOUS | Status: AC
  Administered 2022-04-10: 2 g via INTRAVENOUS
  Filled 2022-04-10: qty 12.5

## 2022-04-10 MED ORDER — HEPARIN SODIUM (PORCINE) 5000 UNIT/ML IJ SOLN
5000.0000 [IU] | Freq: Three times a day (TID) | INTRAMUSCULAR | Status: DC
  Administered 2022-04-10 – 2022-04-11 (×2): 5000 [IU] via SUBCUTANEOUS
  Filled 2022-04-10 (×2): qty 1

## 2022-04-10 MED ORDER — OXYCODONE HCL 5 MG PO TABS: 5.0000 mg | ORAL_TABLET | Freq: Four times a day (QID) | ORAL | Status: DC | PRN

## 2022-04-10 NOTE — ED Provider Notes (Signed)
[facility] Provider Note    Event Date/Time   First MD Initiated Contact with Patient 04/10/22 1509     (approximate)   History   Shortness of Breath   HPI  Joseph Mcpherson is a 76 y.o. male  who presents to the emergency department today accompanied by wife because of concern for shortness of breath. Most history is obtained from wife at bedside.  She states that around 11:00 today he started having shortness of breath.  Has had a slight cough associated with that.  No chest pain.  No fevers although was sweating this morning.  Patient had hospitalization with discharge a couple of weeks ago after COVID.  He had been doing okay until today.  Additionally had dialysis yesterday and had his normal session.      Physical Exam   Triage Vital Signs: ED Triage Vitals  Enc Vitals Group     BP 04/10/22 1450 (!) 190/81     Pulse Rate 04/10/22 1450 85     Resp 04/10/22 1450 19     Temp 04/10/22 1445 98 F (36.7 C)     Temp Source 04/10/22 1445 Oral     SpO2 04/10/22 1450 (!) 89 %     Weight 04/10/22 1446 144 lb 9.6 oz (65.6 kg)     Height 04/10/22 1446 '5\' 4"'$  (1.626 m)     Head Circumference --      Peak Flow --      Pain Score 04/10/22 1445 0     Pain Loc --      Pain Edu? --      Excl. in [place]? --     Most recent vital signs: Vitals:   04/10/22 1450 04/10/22 1451  BP: (!) 190/81   Pulse: 85   Resp: 19   Temp: 97.7 F (36.5 C)   SpO2: (!) 89% 92%   General: Awake, alert. CV:  Good peripheral perfusion. Regular rate and rhythm. Resp:  Normal effort. Lungs clear. Occasional dry cough. Abd:  No distention. Non tender.   ED Results / Procedures / Treatments   Labs (all labs ordered are listed, but only abnormal results are displayed) Labs Reviewed  CBC WITH DIFFERENTIAL/PLATELET - Abnormal; Notable for the following components:      Result Value   WBC 17.3 (*)    RBC 3.91 (*)    Hemoglobin 10.1 (*)    HCT 33.3 (*)    MCH 25.8 (*)    RDW  17.3 (*)    Neutro Abs 15.6 (*)    Abs Immature Granulocytes 0.10 (*)    All other components within normal limits  BASIC METABOLIC PANEL - Abnormal; Notable for the following components:   Glucose, Bld 332 (*)    Creatinine, Ser 6.31 (*)    GFR, Estimated 9 (*)    All other components within normal limits  TROPONIN I (HIGH SENSITIVITY) - Abnormal; Notable for the following components:   Troponin I (High Sensitivity) 37 (*)    All other components within normal limits  RESP PANEL BY RT-PCR (FLU A&B, COVID) ARPGX2  CULTURE, BLOOD (ROUTINE X 2)  CULTURE, BLOOD (ROUTINE X 2)  LACTIC ACID, PLASMA  LACTIC ACID, PLASMA  TROPONIN I (HIGH SENSITIVITY)     EKG  I, Nance Pear, attending physician, personally viewed and interpreted this EKG  EKG Time: 1523 Rate: 74 Rhythm: sinus rhythm Axis: right axis deviation Intervals: qtc 466 QRS: narrow ST changes: no st elevation Impression:  abnormal ekg  RADIOLOGY I independently interpreted and visualized the CXR. My interpretation: Airspace disease in right lower lung Radiology interpretation:  IMPRESSION:  1. New patchy opacities in the right lung base are nonspecific, and  could represent atelectasis, but infection could have a similar  appearance.  2. Trace right pleural effusion.  3. Prominent interstitial opacities with mild peribronchial cuffing,  could suggest a component of mild pulmonary edema.     PROCEDURES:  Critical Care performed: Yes  Procedures  CRITICAL CARE Performed by: Nance Pear   Total critical care time: 35 minutes  Critical care time was exclusive of separately billable procedures and treating other patients.  Critical care was necessary to treat or prevent imminent or life-threatening deterioration.  Critical care was time spent personally by me on the following activities: development of treatment plan with patient and/or surrogate as well as nursing, discussions with consultants,  evaluation of patient's response to treatment, examination of patient, obtaining history from patient or surrogate, ordering and performing treatments and interventions, ordering and review of laboratory studies, ordering and review of radiographic studies, pulse oximetry and re-evaluation of patient's condition.   MEDICATIONS ORDERED IN ED: Medications - No data to display   IMPRESSION / MDM / [place] / ED COURSE  I reviewed the triage vital signs and the nursing notes.                              Differential diagnosis includes, but is not limited to, pneumonia, long covid, flu, edema, acs.  Patient's presentation is most consistent with acute presentation with potential threat to life or bodily function.   The patient is on the cardiac monitor to evaluate for evidence of arrhythmia and/or significant heart rate changes.   Patient presents to the emergency department today because of concern for shortness of breath. Patient was found to be hypoxic on RA. Does not use oxygen at home. Was placed on 2L O2 [place]. CXR is concerning for possible infection and WBC count is elevated. Multiple broad spectrum antibiotics ordered. Discussed concern for pneumonia with patient and family. Discussed with Dr. Sheppard Coil with the hospitalist service who will plan on admission.Marland Kitchen   FINAL CLINICAL IMPRESSION(S) / ED DIAGNOSES   Final diagnoses:  HCAP (healthcare-associated pneumonia)  Shortness of breath  Hypoxia     Note:  This document was prepared using Dragon voice recognition software and may include unintentional dictation errors.    Nance Pear, MD 04/10/22 [phone]

## 2022-04-10 NOTE — Progress Notes (Signed)
Pharmacy Antibiotic Note  Joseph Mcpherson is a 76 y.o. male admitted on 04/10/2022 with pneumonia.  Pharmacy has been consulted for Vancomycin and Cefepime dosing.  -Hemodialysis patient, last HD 04/09/22 -MRSA PCR pending - Recently hospitalized w/ COVID pneumonia 03/22/22-03/26/22    Plan: Patient received Cefepime 2 gm x 1 in ED -Will continue with Cefepime 1 gram IV Q24h (given post HD on HD days)  Patient received Vancomycin 1250 mg IV x 1 in ED -Will f/u plans for Hemodialysis and plan to order Vancomycin '750mg'$  QHD based on wt= 65.6 kg     Height: '5\' 4"'$  (162.6 cm) Weight: 65.6 kg (144 lb 9.6 oz) IBW/kg (Calculated) : 59.2  Temp (24hrs), Avg:97.9 F (36.6 C), Min:97.7 F (36.5 C), Max:98 F (36.7 C)  Recent Labs  Lab 04/10/22 1525 04/10/22 1617  WBC 17.3*  --   CREATININE 6.31*  --   LATICACIDVEN  --  0.8    Estimated Creatinine Clearance: 8.3 mL/min (A) (by C-G formula based on SCr of 6.31 mg/dL (H)).    No Known Allergies  Antimicrobials this admission: cefepime 11/17 >>   vancomycin 11/17 >>    Dose adjustments this admission:    Microbiology results: 11/17 BCx: pend   UCx:    11/17 Sputum: pend  11/17 MRSA PCR: pend 11/17 Cdiff pend  Thank you for allowing pharmacy to be a part of this patient's care.  Ashni Lonzo A 04/10/2022 6:34 PM

## 2022-04-10 NOTE — ED Notes (Signed)
Report given to [facility] of room 130.  Transport called.

## 2022-04-10 NOTE — ED Triage Notes (Signed)
BIB [facility] from home for SOB. Pt had dialysis yesterday. Today onset of SOB and callled 911. 212/110 EKG ok 92% on RA Walking dropped to 89% Placed on 2 liters BGL elevated at 290.

## 2022-04-10 NOTE — Consult Note (Signed)
PHARMACY -  BRIEF ANTIBIOTIC NOTE   Pharmacy has received consult(s) for vancomycin and cefepime from an ED provider.  The patient's profile has been reviewed for ht/wt/allergies/indication/available labs.    One time order(s) placed for Vancomycin '1250mg'$  IV x1 and Cefepime 2g IV x1  Further antibiotics/pharmacy consults should be ordered by admitting physician if indicated.                       Thank you, Darrick Penna 04/10/2022  4:13 PM

## 2022-04-10 NOTE — Hospital Course (Addendum)
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 Recently hospitalized w/ COVID pneumonia 03/22/22-03/26/22, and for NSTEMI 03/09/22-03/13/22.   HPI: Pt brought to ED from home via EMS for SOB. Felt okay yesterday and this morning was feeling very tired and easily worn out / short winded w/ minimal activity. Wife describes associated sweating but no chest pain or fever, no cough. SHe states he had one runny BM this morning, nonbloody, no persistent diarrhea. He continued to feel poorly thoguth home PT today. Family called EMS. Per EMS hypertensive w/ BP 212/110, SpO2 92% RA and dropped to 89% w/ minimal activity, hyperglycemic.   Hospital course:  11/17: in ED, VS hypoxic on RA down to 88-89%, not on home O2, improved on [place] 2L. Afebrile. WBC elevated to 17.0, but not meeting sepsis criteria. Hypertensive. CXR concerning for pulmonary edema vs pneumonia. BNP elevated to 4110. Cr c/w known ESRD. Hgb baseline at 10.1. Troponin c/w ESRD. Hyperglycemic. EDP started Tx for HCAP (given recent admission to hospital) w/ cefepime and vancomycin. Hospitalist consulted --> admitted to inpatient for acute hypoxic respiratory failure d/t HFrEF w/ volume overload versus/in addition to HCAP.  11/18: tropes up to 500+, cardiology consulted (Dr. Ronny Flurry) and started heparin gtt x48 hours. TSH low, added T3/T4. Procal and MRSA no concerns --> deescalate abx to d/c vanc. HD today. BP better today. Curbside w/ pulmonary (Dr. Lanney Gins) - agree w/ consider CT to reeval pleural effusions if no improvement after dialysis. In HD removed 1000 mL.  11/19: remains on 2L O2 w/ SpO2 99-100% on this. Remains on heparin gtt 11/20: Wife reports starting last night he had some jerking type movements, I and night coverage were not alerted. On eval  this AM, he does have some jerking/clonic type movement w/ intentional movement such as repositioning / squeezing my hands but RN reports he is still when asleep. ABG unable to be obtained, VBG ordered along w/ BMP and Mg - K up some, spoke w/ nephro Dr Holley Raring, ordered Lazarus Salines and insulin/dextrose, will repeat levels later today. VBG no significant concerns. CT head findings normal for age. Spoke w/ neurology Dr Quinn Axe who will see him this afternoon --> Concern for myoclonus, obtained EEG which was abnormal. Started on [place]. Dr Quinn Axe recommends transfer to [facility] for continuous EEG. Dr Annice Pih is on overnight at [facility] and will get pt on EEG, w/ Dr Leonides Schanz, Leonel Ramsay, or Hortense Ramal to see him in the AM. I called hospitalist Dr Myna Hidalgo and will accept. No beds at this time 11/21: remain no bed availability at [facility], or [facility], or [facility]. Hopeful for bed tomorrow at [facility]. Pt stable for now, improved on Keppra. Neurology continues to follow. MRI brain pending.   Consultants:  Nephrology - maintain dialysis  Cardiology - elevated troponins Neurology - myoclonus   Procedures: EEG 04/13/22 which was abnormal       ASSESSMENT & PLAN:   Principal Problem:   Seizure ([place]) Active Problems:   Acute respiratory failure with hypoxia (HCC)   End stage renal disease (HCC)   Essential hypertension   Insulin dependent type 2 diabetes mellitus ([place])   Hypothyroidism   Mixed hyperlipidemia   Dementia (HCC)   CAD (coronary artery disease)   Leukocytosis   Anemia due to chronic kidney disease  Acute HFrEF (heart failure with reduced ejection fraction) (HCC)   Hypoxic respiratory failure (HCC)   Myoclonus   Abnormal EEG   Seizure w/ myoclonus and abnormal EEG Metabolic encephalopathy Pharmacy was asked to eval Rx for side effects --> d/c chlorpromazine and gabapentin CT head no concerns Neurology consulted --> abnormal EEG starting Keppra  transfer for continuous EEG - see hospital course,  difficulty finding bed, hopefully [facility] will have something tomorrow, please call [facility] again tomorrow as well!  MRI brain pending.   Acute respiratory failure with hypoxia (HCC) Acute HFrEF (heart failure with reduced ejection fraction) (HCC) Ddx: fluid overload in setting of HFrEF, possible HCAP Initially treating presumptive HCAP: but given improvement post-dialysis, pneumonia can be ruled OUT and abx d/c Treat fluid overload HFrEF given (+)rales and pulmonary edema on CXR, known pleural effusion Improvement post-dialysis Recent echocardiogram, will not repeat I&O Consider CT chest if clinically worsening Continue supplemental O2 as needed - on room air as of 04/12/22   Leukocytosis - resolved, likely reactive Loose stool in setting of recent hospitalization - resolved Await CDiff screen but this seems unlikely, no further BM and WBC improving  Consider CT chest/abd/pelvis if clinically worsening  BCx NG x2d  CAD (coronary artery disease) NSTEMI treatment but cardiology feels elevated troponins more likely d/t demand ischemia No chest pain  Continue BP control, ASA, Statin, Plavix  troponin increased overnight, repeat again this AM trending up --> consulted cardiology (Dr Humphrey Rolls) plan for heparin gtt 48h, not candidate for intervention at this time  Cardiology following  End stage renal disease ([place]) HD per nephrology team   Essential hypertension Home meds Prn metoprolol   Insulin dependent type 2 diabetes mellitus ([place]) SSI and basal insulin A1C  Hypothyroidism w/ low TSH  holding home levothyroxine  No recent TSH and last 12/2021 was abnormal, will check TSH (given acute illness, would not change dose synthroid unless significantly abnormal) --> TSH 0.200 adding on T3 and Free T4   Mixed hyperlipidemia Continue statin   Dementia (HCC) Continue donepezil   Hx CVA Continue BP control, ASA, Statin, Plavix   Anemia due to chronic kidney disease Trend CBC   DVT  prophylaxis: heparin gtt per cardiology --> D/C today  Pertinent IV fluids/nutrition: no continuous IV fluids Central lines / invasive devices: dialysis catheter R subclavian   Code Status: FULL CODE confirmed w/ patient and family in the ED on admission by Dr Sheppard Coil   Disposition: inpatient  TOC needs: pending clinical course

## 2022-04-10 NOTE — H&P (Addendum)
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 Recently hospitalized w/ COVID pneumonia 03/22/22-03/26/22, and for NSTEMI 03/09/22-03/13/22.   HPI: Pt brought to ED from home via EMS for SOB. Felt okay yesterday and this morning was feeling very tired and easily worn out / short winded w/ minimal activity. Wife describes associated sweating but no chest pain or fever, no cough. SHe states he had one runny BM this morning, nonbloody, no persistent diarrhea. He continued to feel poorly thoguth home PT today. Family called EMS. Per EMS hypertensive w/ BP 212/110, SpO2 92% RA and dropped to 89% w/ minimal activity, hyperglycemic.   Hospital course:  11/17: in ED, VS hypoxic on RA down to 88-89%, not on home O2, improved on [place] 2L. Afebrile. WBC elevated to 17.0, but not meeting sepsis criteria. Hypertensive. CXR concerning for pulmonary edema vs pneumonia. BNP elevated to 4110. Cr c/w known ESRD. Hgb baseline at 10.1. Troponin c/w ESRD. Hyperglycemic. EDP started Tx for HCAP (given recent admission to hospital) w/ cefepime and vancomycin. Hospitalist consulted --> admitted to inpatient for acute hypoxic respiratory failure d/t HFrEF w/ volume overload versus/in addition to HCAP.   Consultants:  Nephrology   Procedures: none      ASSESSMENT & PLAN:   Active Problems:   Acute respiratory failure with hypoxia (HCC)   End stage renal disease (HCC)   Essential hypertension   Insulin dependent type 2 diabetes mellitus (HCC)   Hypothyroidism   Mixed hyperlipidemia   Dementia (HCC)   CAD (coronary artery disease)    Leukocytosis   Anemia due to chronic kidney disease   Acute HFrEF (heart failure with reduced ejection fraction) (HCC)   Acute respiratory failure with hypoxia (HCC) Acute HFrEF (heart failure with reduced ejection fraction) (HCC) Ddx: fluid overload in setting of HFrEF, possible HCAP Treat HCAP: no rhonchi on exam or definitive CXR findings but elevated WBC and hypoxia support pneumonia diagnosis Abx w/  Procalcitonin  Consider CT chest if clinically worsening  Sputum culture  Await blood cultures  Treat fluid overload HFrEF given (+)rales and edema on CXR, Anticipate dialysis tomorrow for fluid removal - nephrology team aware, no need for urgent dialysis at this time  Pt does make some urine, will trial IV lasix today Recent echocardiogram, will not repeat Consider cardiology consult if not improving   Continue supplemental O2   Leukocytosis Loose stool in setting of recent hospitalization Ddx includes HCAP as above, CDiff vs viral gastroenteritis, les slikely infection from biliary tube or HD cath but keep this in mind No abdominal pain or rash CDiff screen  Consider CT chest/abd/pelvis if clinically worsening  Pending blood cultures   CAD (coronary artery disease) No chest pain but sweating questionably diaphoretic and fatigue, will r/o ACS though no strong suspicion at this time he certainly has risk factors  Continue BP control, ASA, Statin, Plavix  Trend troponin, if significant upward trend would start heparin gtt and alert cardiology  End stage renal disease ([place]) HD per nephrology team   Essential hypertension Home meds Prn metoprolol   Insulin dependent type 2 diabetes  mellitus ([place]) SSI and basal insulin A1C  Hypothyroidism Continue home levothyroxine  No recent TSH and last 12/2021 was abnormal, will check TSH (given acute illness, would not change dose synthroid unless significantly abnormal)   Mixed hyperlipidemia Continue statin   Dementia  (HCC) Continue donepezil   Hx CVA Continue BP control, ASA, Statin, Plavix   Anemia due to chronic kidney disease Trend CBC   DVT prophylaxis: heparin Pertinent IV fluids/nutrition: no continuous IV fluids Central lines / invasive devices: RUQ biliary tube, dialysis catheter R subclavian   Code Status: FULL CODE Family Communication: family at bedside in ED   Disposition: inpatient  TOC needs: pending clinical course              Review of Systems: Review of Systems  Constitutional:  Positive for diaphoresis and malaise/fatigue. Negative for chills and fever.  HENT:  Negative for congestion and sinus pain.   Respiratory:  Positive for shortness of breath. Negative for cough, hemoptysis, sputum production and wheezing.   Cardiovascular:  Negative for chest pain, palpitations, orthopnea, claudication and leg swelling.  Gastrointestinal:  Positive for diarrhea. Negative for abdominal pain, blood in stool, heartburn, nausea and vomiting.  Genitourinary:  Negative for dysuria, frequency and hematuria.  Musculoskeletal:  Negative for back pain, myalgias and neck pain.  Skin:  Negative for rash.  Neurological:  Negative for dizziness, focal weakness and headaches.  Psychiatric/Behavioral:  Negative for depression.      As per HPI otherwise 10 point review of systems negative.   No Known Allergies  Past Medical History:  Diagnosis Date   Clotting disorder ([place])    per daughter, "when patient travels, his feet swell"   Diabetes mellitus without complication ([place])    type II   Diabetic eye exam ([place]) 12/2019   ESRD (end stage renal disease) ([place])    MWF- [place]   GERD (gastroesophageal reflux disease)    [name] (hard of hearing)    Hyperlipidemia    Hypertension    Hypothyroidism    Thyroid disease     Past Surgical History:  Procedure Laterality Date   AV FISTULA PLACEMENT Right 10/22/2017   Procedure: ARTERIOVENOUS (AV) BRACHIOCEPHALIC FISTULA CREATION  RIGHT UPPER ARM;  Surgeon: Conrad , MD;  Location: [facility];  Service: Vascular;  Laterality: Right;   AV FISTULA PLACEMENT Left 02/03/2019   Procedure: INSERTION OF ARTERIOVENOUS (AV) GORE-TEX GRAFT ARM (BRACHIAL AXILLARY );  Surgeon: [name], MD;  Location: [facility];  Service: Vascular;  Laterality: Left;   AV FISTULA PLACEMENT Left 12/10/2020   Procedure: LEFT ARM BRACHIOCEPHALIC ARTERIOVENOUS (AV) FISTULA CREATION;  Surgeon: [name], MD;  Location: [facility];  Service: Vascular;  Laterality: Left;   IR CHOLANGIOGRAM EXISTING TUBE  03/18/2022   IR PERC CHOLECYSTOSTOMY  01/21/2022   UPPER EXTREMITY ANGIOGRAPHY Left 09/19/2019   Procedure: UPPER EXTREMITY ANGIOGRAPHY;  Surgeon: [name], MD;  Location: [facility];  Service: Cardiovascular;  Laterality: Left;     reports that he has never smoked. His smokeless tobacco use includes snuff. He reports that he does not drink alcohol and does not use drugs.  Family History  Problem Relation Age of Onset   Diabetes Mother    Hyperlipidemia Mother    Hypertension Father     Prior to Admission medications   Medication Sig Start Date End Date Taking? Authorizing Provider  albuterol (PROVENTIL) (2.5 MG/3ML) 0.083% nebulizer solution Take 3 mLs (2.5 mg total) by nebulization every  6 (six) hours as needed for wheezing or shortness of breath. 03/13/22   Lorella Nimrod, MD  amLODipine (NORVASC) 10 MG tablet Take 1 tablet (10 mg total) by mouth daily. 02/02/22   Fritzi Mandes, MD  aspirin EC 81 MG tablet Take 81 mg by mouth daily. Swallow whole.    [provider]  atorvastatin (LIPITOR) 40 MG tablet Take 1 tablet (40 mg total) by mouth daily. 03/14/22   Lorella Nimrod, MD  B Complex-C-Folic Acid (RENA-VITE RX) 1 MG TABS Take 1 tablet by mouth daily.     [provider]  BD PEN NEEDLE NANO 2ND GEN 32G X 4 MM MISC USE AS DIRECTED 02/26/22   [name], MD  calcium acetate (PHOSLO) 667 MG capsule TAKE 2  CAPSULES BY MOUTH 3 TIMES A DAY WITH MEAL 02/16/22   [name], MD  chlorproMAZINE (THORAZINE) 25 MG tablet TAKE 1 TABLET BY MOUTH THREE TIMES A DAY 02/27/22   [name], MD  Cholecalciferol (VITAMIN D3) 5000 units TABS Take 5,000 Units by mouth daily.     [provider]  clopidogrel (PLAVIX) 75 MG tablet Take 1 tablet (75 mg total) by mouth daily. 03/14/22   Lorella Nimrod, MD  Continuous Blood Gluc Sensor (FREESTYLE LIBRE 14 DAY SENSOR) MISC APPLY EVERY 14 (FOURTEEN) DAYS. 02/02/22   [name], MD  donepezil (ARICEPT) 10 MG tablet TAKE 1 TABLET BY MOUTH EVERY DAY 04/22/21   [name], MD  gabapentin (NEURONTIN) 300 MG capsule TAKE 1 CAPSULE BY MOUTH EVERY DAY AT BEDTIME AS DIRECTED 11/05/21   [name], MD  insulin detemir (LEVEMIR) 100 UNIT/ML injection Inject 0.15 mLs (15 Units total) into the skin at bedtime. 02/02/22   Fritzi Mandes, MD  ipratropium-albuterol (DUONEB) 0.5-2.5 (3) MG/3ML SOLN Take 3 mLs by nebulization every 6 (six) hours as needed. 03/05/22   [name], MD  levothyroxine (SYNTHROID) 137 MCG tablet TAKE 1 TABLET BY MOUTH EVERY DAY 03/24/21   [name], MD  losartan (COZAAR) 100 MG tablet Take 1 tablet (100 mg total) by mouth every evening. 02/02/22   Fritzi Mandes, MD  metoprolol succinate (TOPROL-XL) 50 MG 24 hr tablet TAKE 1 TABLET BY MOUTH EVERY DAY 01/23/22   [name], MD  Multiple Vitamins-Minerals (MULTIVITAMIN WITH MINERALS) tablet Take 1 tablet by mouth daily.    [provider]  pantoprazole (PROTONIX) 40 MG tablet TAKE 1 TABLET BY MOUTH EVERY DAY 07/09/21   [name], MD  predniSONE (DELTASONE) 10 MG tablet '40mg'$  daily x 1 day, '30mg'$  daily x 1 day, '20mg'$  daily x 1 day, '10mg'$  daily x 1 day then stop 03/26/22   Wyvonnia Dusky, MD  sevelamer carbonate (RENVELA) 2.4 g PACK Take 2.4 g by mouth daily. 02/25/22   [name], MD  sitaGLIPtin (JANUVIA) 50 MG tablet Take 50 mg by mouth every evening.     [provider]   sodium chloride flush (NS) 0.9 % SOLN Inject 5 mLs into the vein daily. Flush cholecystostomy tube daily with 5 ccs of NS 03/26/22 05/25/22  Wyvonnia Dusky, MD    Physical Exam: Vitals:   04/10/22 1446 04/10/22 1450 04/10/22 1451 04/10/22 1730  BP:  (!) 190/81    Pulse:  85  61  Resp:  19  14  Temp:  97.7 F (36.5 C)    TempSrc:  Oral    SpO2:  (!) 89% 92% 100%  Weight: 65.6 kg     Height: '5\' 4"'$  (1.626 m)  Physical Exam Constitutional:      General: He is not in acute distress. HENT:     Head: Normocephalic and atraumatic.  Eyes:     Extraocular Movements: Extraocular movements intact.  Cardiovascular:     Rate and Rhythm: Normal rate and regular rhythm.  Pulmonary:     Effort: No tachypnea or accessory muscle usage.     Breath sounds: Examination of the right-middle field reveals rales. Examination of the left-middle field reveals rales. Examination of the right-lower field reveals rales. Examination of the left-lower field reveals rales. Rales present.  Chest:     Chest wall: No mass or tenderness.  Abdominal:     General: Bowel sounds are normal.     Palpations: Abdomen is soft.  Musculoskeletal:        General: Normal range of motion.     Cervical back: Neck supple.  Skin:    General: Skin is warm and dry.  Neurological:     General: No focal deficit present.     Mental Status: He is alert.  Psychiatric:        Mood and Affect: Mood normal.        Behavior: Behavior normal.        Results for orders placed or performed during the hospital encounter of 04/10/22 (from the past 48 hour(s))  Resp Panel by RT-PCR (Flu A&B, Covid) Anterior Nasal Swab     Status: None   Collection Time: 04/10/22  3:25 PM   Specimen: Anterior Nasal Swab  Result Value Ref Range   SARS Coronavirus 2 by RT PCR NEGATIVE NEGATIVE    Comment: (NOTE) SARS-CoV-2 target nucleic acids are NOT DETECTED.  The SARS-CoV-2 RNA is generally detectable in upper respiratory specimens  during the acute phase of infection. The lowest concentration of SARS-CoV-2 viral copies this assay can detect is 138 copies/mL. A negative result does not preclude SARS-Cov-2 infection and should not be used as the sole basis for treatment or other patient management decisions. A negative result may occur with  improper specimen collection/handling, submission of specimen other than nasopharyngeal swab, presence of viral mutation(s) within the areas targeted by this assay, and inadequate number of viral copies(<138 copies/mL). A negative result must be combined with clinical observations, patient history, and epidemiological information. The expected result is Negative.  Fact Sheet for Patients:  EntrepreneurPulse.com.au  Fact Sheet for Healthcare Providers:  IncredibleEmployment.be  This test is no t yet approved or cleared by the [place] FDA and  has been authorized for detection and/or diagnosis of SARS-CoV-2 by FDA under an Emergency Use Authorization (EUA). This EUA will remain  in effect (meaning this test can be used) for the duration of the COVID-19 declaration under Section 564(b)(1) of the Act, 21 U.S.C.section 360bbb-3(b)(1), unless the authorization is terminated  or revoked sooner.       Influenza A by PCR NEGATIVE NEGATIVE   Influenza B by PCR NEGATIVE NEGATIVE    Comment: (NOTE) The Xpert Xpress SARS-CoV-2/FLU/RSV plus assay is intended as an aid in the diagnosis of influenza from Nasopharyngeal swab specimens and should not be used as a sole basis for treatment. Nasal washings and aspirates are unacceptable for Xpert Xpress SARS-CoV-2/FLU/RSV testing.  Fact Sheet for Patients: EntrepreneurPulse.com.au  Fact Sheet for Healthcare Providers: IncredibleEmployment.be  This test is not yet approved or cleared by the [place] FDA and has been authorized for detection and/or diagnosis  of SARS-CoV-2 by FDA under an Emergency Use Authorization (EUA). This EUA  will remain in effect (meaning this test can be used) for the duration of the COVID-19 declaration under Section 564(b)(1) of the Act, 21 U.S.C. section 360bbb-3(b)(1), unless the authorization is terminated or revoked.  Performed at [facility], [address] [place], [place] [address]   CBC with Differential     Status: Abnormal   Collection Time: 04/10/22  3:25 PM  Result Value Ref Range   WBC 17.3 (H) 4.0 - 10.5 K/uL   RBC 3.91 (L) 4.22 - 5.81 MIL/uL   Hemoglobin 10.1 (L) 13.0 - 17.0 g/dL   HCT 33.3 (L) 39.0 - 52.0 %   MCV 85.2 80.0 - 100.0 fL   MCH 25.8 (L) 26.0 - 34.0 pg   MCHC 30.3 30.0 - 36.0 g/dL   RDW 17.3 (H) 11.5 - 15.5 %   Platelets 184 150 - 400 K/uL   nRBC 0.0 0.0 - 0.2 %   Neutrophils Relative % 88 %   Neutro Abs 15.6 (H) 1.7 - 7.7 K/uL   Lymphocytes Relative 5 %   Lymphs Abs 0.8 0.7 - 4.0 K/uL   Monocytes Relative 4 %   Monocytes Absolute 0.6 0.1 - 1.0 K/uL   Eosinophils Relative 1 %   Eosinophils Absolute 0.1 0.0 - 0.5 K/uL   Basophils Relative 1 %   Basophils Absolute 0.1 0.0 - 0.1 K/uL   Immature Granulocytes 1 %   Abs Immature Granulocytes 0.10 (H) 0.00 - 0.07 K/uL    Comment: Performed at [facility], [address] [place], [place] [address]  Basic metabolic panel     Status: Abnormal   Collection Time: 04/10/22  3:25 PM  Result Value Ref Range   Sodium 137 135 - 145 mmol/L   Potassium 4.8 3.5 - 5.1 mmol/L   Chloride 101 98 - 111 mmol/L   CO2 26 22 - 32 mmol/L   Glucose, Bld 332 (H) 70 - 99 mg/dL    Comment: Glucose reference range applies only to samples taken after fasting for at least 8 hours.   BUN 23 8 - 23 mg/dL   Creatinine, Ser 6.31 (H) 0.61 - 1.24 mg/dL   Calcium 8.9 8.9 - 10.3 mg/dL   GFR, Estimated 9 (L) >60 mL/min    Comment: (NOTE) Calculated using the CKD-EPI Creatinine Equation (2021)    Anion gap 10 5 - 15    Comment: Performed  at [facility], [address] [place], [place] [address]  Troponin I (High Sensitivity)     Status: Abnormal   Collection Time: 04/10/22  3:25 PM  Result Value Ref Range   Troponin I (High Sensitivity) 37 (H) <18 ng/L    Comment: (NOTE) Elevated high sensitivity troponin I (hsTnI) values and significant  changes across serial measurements may suggest ACS but many other  chronic and acute conditions are known to elevate hsTnI results.  Refer to the "Links" section for chest pain algorithms and additional  guidance. Performed at [facility], [address] [place], [place] [address]   Lactic acid, plasma     Status: None   Collection Time: 04/10/22  4:17 PM  Result Value Ref Range   Lactic Acid, Venous 0.8 0.5 - 1.9 mmol/L    Comment: Performed at [facility], [address] [place], [place] [address]   DG Chest Portable 1 View  Result Date: 04/10/2022 CLINICAL DATA:  Shortness of breath EXAM: PORTABLE CHEST 1 VIEW COMPARISON:  03/22/2022 FINDINGS: Right-sided tunneled central venous catheter  with the tip in the right atrium. Small right pleural effusion. No pneumothorax. Unchanged cardiac and mediastinal contours. Compared to prior exam there are new linear opacities in the right lung base, which are nonspecific, and could represent atelectasis, but infection could have a similar appearance. Prominent interstitial opacities with mild peribronchial cuffing, could suggest a component of mild pulmonary edema. No displaced rib fractures. There is a left axillary vascular stent in place. IMPRESSION: 1. New patchy opacities in the right lung base are nonspecific, and could represent atelectasis, but infection could have a similar appearance. 2. Trace right pleural effusion. 3. Prominent interstitial opacities with mild peribronchial cuffing, could suggest a component of mild pulmonary edema. Electronically Signed   By: Marin Roberts M.D.   On: 04/10/2022 15:36        Admission status: Inpatient Telemetry Medical  Certification: The appropriate patient status for this patient is INPATIENT. Inpatient status is judged to be reasonable and necessary in order to provide the required intensity of service to ensure the patient's safety. The patient's presenting symptoms, physical exam findings, and initial radiographic and laboratory data in the context of their chronic comorbidities is felt to place them at high risk for further clinical deterioration. Furthermore, it is not anticipated that the patient will be medically stable for discharge from the hospital within 2 midnights of admission.   * I certify that at the point of admission it is my clinical judgment that the patient will require inpatient hospital care spanning beyond 2 midnights from the point of admission due to high intensity of service, high risk for further deterioration and high frequency of surveillance required.Emeterio Reeve MD [facility] If 7PM-7AM, please contact night-coverage [email or web address]  04/10/2022, 6:23 PM

## 2022-04-11 DIAGNOSIS — I2510 Atherosclerotic heart disease of native coronary artery without angina pectoris: Secondary | ICD-10-CM | POA: Diagnosis not present

## 2022-04-11 DIAGNOSIS — E039 Hypothyroidism, unspecified: Secondary | ICD-10-CM | POA: Diagnosis not present

## 2022-04-11 DIAGNOSIS — J9601 Acute respiratory failure with hypoxia: Secondary | ICD-10-CM | POA: Diagnosis not present

## 2022-04-11 DIAGNOSIS — N186 End stage renal disease: Secondary | ICD-10-CM | POA: Diagnosis not present

## 2022-04-11 LAB — PROTIME-INR
INR: 1 (ref 0.8–1.2)
Prothrombin Time: 13.2 seconds (ref 11.4–15.2)

## 2022-04-11 LAB — COMPREHENSIVE METABOLIC PANEL
ALT: 10 U/L (ref 0–44)
AST: 16 U/L (ref 15–41)
Albumin: 2.6 g/dL — ABNORMAL LOW (ref 3.5–5.0)
Alkaline Phosphatase: 110 U/L (ref 38–126)
Anion gap: 9 (ref 5–15)
BUN: 31 mg/dL — ABNORMAL HIGH (ref 8–23)
CO2: 27 mmol/L (ref 22–32)
Calcium: 8.9 mg/dL (ref 8.9–10.3)
Chloride: 103 mmol/L (ref 98–111)
Creatinine, Ser: 7.29 mg/dL — ABNORMAL HIGH (ref 0.61–1.24)
GFR, Estimated: 7 mL/min — ABNORMAL LOW (ref >60)
Glucose, Bld: 134 mg/dL — ABNORMAL HIGH (ref 70–99)
Potassium: 4.6 mmol/L (ref 3.5–5.1)
Sodium: 139 mmol/L (ref 135–145)
Total Bilirubin: 1.2 mg/dL (ref 0.3–1.2)
Total Protein: 6 g/dL — ABNORMAL LOW (ref 6.5–8.1)

## 2022-04-11 LAB — CBC
HCT: 29.5 % — ABNORMAL LOW (ref 39.0–52.0)
Hemoglobin: 9.1 g/dL — ABNORMAL LOW (ref 13.0–17.0)
MCH: 26.1 pg (ref 26.0–34.0)
MCHC: 30.8 g/dL (ref 30.0–36.0)
MCV: 84.8 fL (ref 80.0–100.0)
Platelets: 157 10*3/uL (ref 150–400)
RBC: 3.48 MIL/uL — ABNORMAL LOW (ref 4.22–5.81)
RDW: 17.2 % — ABNORMAL HIGH (ref 11.5–15.5)
WBC: 10.1 10*3/uL (ref 4.0–10.5)
nRBC: 0 % (ref 0.0–0.2)

## 2022-04-11 LAB — APTT: aPTT: 37 seconds — ABNORMAL HIGH (ref 24–36)

## 2022-04-11 LAB — HEMOGLOBIN A1C
Hgb A1c MFr Bld: 5.9 % — ABNORMAL HIGH (ref 4.8–5.6)
Mean Plasma Glucose: 122.63 mg/dL

## 2022-04-11 LAB — TSH: TSH: 0.2 u[IU]/mL — ABNORMAL LOW (ref 0.350–4.500)

## 2022-04-11 LAB — GLUCOSE, CAPILLARY
Glucose-Capillary: 129 mg/dL — ABNORMAL HIGH (ref 70–99)
Glucose-Capillary: 135 mg/dL — ABNORMAL HIGH (ref 70–99)
Glucose-Capillary: 75 mg/dL (ref 70–99)
Glucose-Capillary: 53 mg/dL — ABNORMAL LOW (ref 70–99)
Glucose-Capillary: 66 mg/dL — ABNORMAL LOW (ref 70–99)
Glucose-Capillary: 81 mg/dL (ref 70–99)

## 2022-04-11 LAB — HEPATITIS B SURFACE ANTIGEN: Hepatitis B Surface Ag: NONREACTIVE

## 2022-04-11 LAB — TROPONIN I (HIGH SENSITIVITY): Troponin I (High Sensitivity): 529 ng/L (ref <18)

## 2022-04-11 LAB — T4, FREE: Free T4: 1.49 ng/dL — ABNORMAL HIGH (ref 0.61–1.12)

## 2022-04-11 MED ORDER — PENTAFLUOROPROP-TETRAFLUOROETH EX AERO
1.0000 | INHALATION_SPRAY | CUTANEOUS | Status: DC | PRN
  Filled 2022-04-11: qty 30

## 2022-04-11 MED ORDER — CHLORHEXIDINE GLUCONATE CLOTH 2 % EX PADS
6.0000 | MEDICATED_PAD | Freq: Every day | CUTANEOUS | Status: DC
  Administered 2022-04-11 – 2022-04-14 (×3): 6 via TOPICAL

## 2022-04-11 MED ORDER — HEPARIN (PORCINE) 25000 UT/250ML-% IV SOLN
950.0000 [IU]/h | INTRAVENOUS | Status: AC
  Administered 2022-04-11 – 2022-04-12 (×2): 750 [IU]/h via INTRAVENOUS
  Filled 2022-04-11 (×2): qty 250

## 2022-04-11 MED ORDER — HEPARIN SODIUM (PORCINE) 1000 UNIT/ML DIALYSIS
1000.0000 [IU] | INTRAMUSCULAR | Status: DC | PRN
  Filled 2022-04-11: qty 1

## 2022-04-11 MED ORDER — LIDOCAINE-PRILOCAINE 2.5-2.5 % EX CREA
1.0000 | TOPICAL_CREAM | CUTANEOUS | Status: DC | PRN
  Filled 2022-04-11: qty 5

## 2022-04-11 MED ORDER — ISOSORB DINITRATE-HYDRALAZINE 20-37.5 MG PO TABS
1.0000 | ORAL_TABLET | Freq: Three times a day (TID) | ORAL | Status: DC
  Administered 2022-04-11 – 2022-04-16 (×11): 1 via ORAL
  Filled 2022-04-11 (×18): qty 1

## 2022-04-11 MED ORDER — HEPARIN BOLUS VIA INFUSION
3900.0000 [IU] | Freq: Once | INTRAVENOUS | Status: AC
  Administered 2022-04-11: 3900 [IU] via INTRAVENOUS
  Filled 2022-04-11: qty 3900

## 2022-04-11 MED ORDER — LIDOCAINE HCL (PF) 1 % IJ SOLN
5.0000 mL | INTRAMUSCULAR | Status: DC | PRN
  Filled 2022-04-11: qty 5

## 2022-04-11 MED ORDER — ALTEPLASE 2 MG IJ SOLR
2.0000 mg | Freq: Once | INTRAMUSCULAR | Status: DC | PRN
  Filled 2022-04-11: qty 2

## 2022-04-11 MED ORDER — ANTICOAGULANT SODIUM CITRATE 4% (200MG/5ML) IV SOLN
5.0000 mL | Status: DC | PRN
  Filled 2022-04-11: qty 5

## 2022-04-11 NOTE — Progress Notes (Signed)
Pt completed 3.5 hour HD treatment w/ no complications. Alert, vss, no c/o, Report to primary RN. Start:1310 End: 8307 1046m fluid removed No hd meds ordered 84L BVP 64.8Kg post bed weight

## 2022-04-11 NOTE — Progress Notes (Signed)
Pt uf off d/t sbp90. Pt resting, safety maintained, continue to monitor. HD RN at bedside.

## 2022-04-11 NOTE — Consult Note (Signed)
Joseph Mcpherson is a 76 y.o. male  [id number]  Primary Cardiologist: Louanne Belton Reason for Consultation: Elevated troponin with shortness of breath.    HPI: This is a 76 year old [place] patient presented to the hospital with shortness of breath fatigue and weakness.  His troponins were trending up thus I was asked to evaluate the patient.  He denies chest pain and appears to be comfortable.   Review of Systems: No chest pain   Past Medical History:  Diagnosis Date   Clotting disorder ([place])    per daughter, "when patient travels, his feet swell"   Diabetes mellitus without complication ([place])    type II   Diabetic eye exam ([place]) 12/2019   ESRD (end stage renal disease) ([place])    MWF- [place]   GERD (gastroesophageal reflux disease)    [name] (hard of hearing)    Hyperlipidemia    Hypertension    Hypothyroidism    Thyroid disease     Medications Prior to Admission  Medication Sig Dispense Refill   albuterol (PROVENTIL) (2.5 MG/3ML) 0.083% nebulizer solution Take 3 mLs (2.5 mg total) by nebulization every 6 (six) hours as needed for wheezing or shortness of breath. 150 mL 1   amLODipine (NORVASC) 10 MG tablet Take 1 tablet (10 mg total) by mouth daily. 30 tablet 3   aspirin EC 81 MG tablet Take 81 mg by mouth daily. Swallow whole.     atorvastatin (LIPITOR) 40 MG tablet Take 1 tablet (40 mg total) by mouth daily. 90 tablet 1   B Complex-C-Folic Acid (RENA-VITE RX) 1 MG TABS Take 1 tablet by mouth daily.      BD PEN NEEDLE NANO 2ND GEN 32G X 4 MM MISC USE AS DIRECTED 300 each 1   calcium acetate (PHOSLO) 667 MG capsule TAKE 2 CAPSULES BY MOUTH 3 TIMES A DAY WITH MEAL 540 capsule 1   chlorproMAZINE (THORAZINE) 25 MG tablet TAKE 1 TABLET BY MOUTH THREE TIMES A DAY 540 tablet 1   Cholecalciferol (VITAMIN D3) 5000 units TABS Take 5,000 Units by mouth daily.      clopidogrel (PLAVIX) 75 MG tablet Take 1 tablet (75 mg total) by mouth daily. 90 tablet 1   Continuous Blood Gluc Sensor  (FREESTYLE LIBRE 14 DAY SENSOR) MISC APPLY EVERY 14 (FOURTEEN) DAYS. 90 each 1   donepezil (ARICEPT) 10 MG tablet TAKE 1 TABLET BY MOUTH EVERY DAY 90 tablet 3   gabapentin (NEURONTIN) 300 MG capsule TAKE 1 CAPSULE BY MOUTH EVERY DAY AT BEDTIME AS DIRECTED 90 capsule 3   insulin detemir (LEVEMIR) 100 UNIT/ML injection Inject 0.15 mLs (15 Units total) into the skin at bedtime. 10 mL 11   ipratropium-albuterol (DUONEB) 0.5-2.5 (3) MG/3ML SOLN Take 3 mLs by nebulization every 6 (six) hours as needed. 120 mL 4   levothyroxine (SYNTHROID) 137 MCG tablet TAKE 1 TABLET BY MOUTH EVERY DAY 90 tablet 3   losartan (COZAAR) 100 MG tablet Take 1 tablet (100 mg total) by mouth every evening. 30 tablet 3   metoprolol succinate (TOPROL-XL) 50 MG 24 hr tablet TAKE 1 TABLET BY MOUTH EVERY DAY 90 tablet 3   Multiple Vitamins-Minerals (MULTIVITAMIN WITH MINERALS) tablet Take 1 tablet by mouth daily.     pantoprazole (PROTONIX) 40 MG tablet TAKE 1 TABLET BY MOUTH EVERY DAY 90 tablet 3   predniSONE (DELTASONE) 10 MG tablet '40mg'$  daily x 1 day, '30mg'$  daily x 1 day, '20mg'$  daily x 1 day, '10mg'$  daily x 1 day then  stop 10 tablet 0   sevelamer carbonate (RENVELA) 2.4 g PACK Take 2.4 g by mouth daily. 90 each 3   sitaGLIPtin (JANUVIA) 50 MG tablet Take 50 mg by mouth every evening.      sodium chloride flush (NS) 0.9 % SOLN Inject 5 mLs into the vein daily. Flush cholecystostomy tube daily with 5 ccs of NS 300 mL 0      aspirin EC  81 mg Oral Daily   atorvastatin  40 mg Oral Daily   calcium acetate  1,334 mg Oral TID WC   Chlorhexidine Gluconate Cloth  6 each Topical Q0600   chlorproMAZINE  25 mg Oral TID   cholecalciferol  5,000 Units Oral Daily   clopidogrel  75 mg Oral Daily   donepezil  10 mg Oral Daily   gabapentin  300 mg Oral QHS   heparin  5,000 Units Subcutaneous Q8H   insulin aspart  0-5 Units Subcutaneous QHS   insulin aspart  0-9 Units Subcutaneous TID WC   insulin aspart  2 Units Subcutaneous TID WC    insulin glargine-yfgn  10 Units Subcutaneous QHS   levothyroxine  137 mcg Oral Q0600   losartan  100 mg Oral QPM   metoprolol succinate  50 mg Oral Daily   multivitamin  1 tablet Oral Daily   pantoprazole  40 mg Oral Daily   sevelamer carbonate  2.4 g Oral Q breakfast   vancomycin variable dose per unstable renal function (pharmacist dosing)   Does not apply See admin instructions    Infusions:  anticoagulant sodium citrate     ceFEPime (MAXIPIME) IV      No Known Allergies  Social History   Socioeconomic History   Marital status: Married    Spouse name: Not on file   Number of children: Not on file   Years of education: 12   Highest education level: 12th grade  Occupational History   Occupation: Retired  Tobacco Use   Smoking status: Never   Smokeless tobacco: Current    Types: Snuff  Vaping Use   Vaping Use: Never used  Substance and Sexual Activity   Alcohol use: No   Drug use: No   Sexual activity: Not Currently  Other Topics Concern   Not on file  Social History Narrative   Not on file   Social Determinants of Health   Financial Resource Strain: Low Risk  (06/19/2021)   Overall Financial Resource Strain (CARDIA)    Difficulty of Paying Living Expenses: Not hard at all  Food Insecurity: No Food Insecurity (03/27/2022)   Hunger Vital Sign    Worried About Estate manager/land agent of Food in the Last Year: Never true    Ran Out of Food in the Last Year: Never true  Transportation Needs: No Transportation Needs (03/27/2022)   PRAPARE - Hydrologist (Medical): No    Lack of Transportation (Non-Medical): No  Physical Activity: Sufficiently Active (06/19/2021)   Exercise Vital Sign    Days of Exercise per Week: 7 days    Minutes of Exercise per Session: 30 min  Stress: No Stress Concern Present (06/19/2021)   [place]    Feeling of Stress : Not at all  Social Connections: [place] (06/19/2021)   Social Connection and Isolation Panel [NHANES]    Frequency of Communication with Friends and Family: More than three times a week    Frequency of Social Gatherings with  Friends and Family: More than three times a week    Attends Religious Services: More than 4 times per year    Active Member of Clubs or Organizations: Yes    Attends Archivist Meetings: More than 4 times per year    Marital Status: Married  Human resources officer Violence: Not At Risk (03/22/2022)   Humiliation, Afraid, Rape, and Kick questionnaire    Fear of Current or Ex-Partner: No    Emotionally Abused: No    Physically Abused: No    Sexually Abused: No    Family History  Problem Relation Age of Onset   Diabetes Mother    Hyperlipidemia Mother    Hypertension Father     PHYSICAL EXAM: Vitals:   04/11/22 0619 04/11/22 0851  BP: (!) 124/46 (!) 148/58  Pulse: (!) 59 61  Resp: 16 16  Temp: (!) 97.4 F (36.3 C) 98.1 F (36.7 C)  SpO2: 99% 99%     Intake/Output Summary (Last 24 hours) at 04/11/2022 1049 Last data filed at 04/10/2022 1852 Gross per 24 hour  Intake 350 ml  Output --  Net 350 ml    General:  Well appearing. No respiratory difficulty HEENT: normal Neck: supple. no JVD. Carotids 2+ bilat; no bruits. No lymphadenopathy or thryomegaly appreciated. Cor: PMI nondisplaced. Regular rate & rhythm. No rubs, gallops or murmurs. Lungs: clear Abdomen: soft, nontender, nondistended. No hepatosplenomegaly. No bruits or masses. Good bowel sounds. Extremities: no cyanosis, clubbing, rash, edema Neuro: alert & oriented x 3, cranial nerves grossly intact. moves all 4 extremities w/o difficulty. Affect pleasant.  ECG: Normal sinus rhythm with lateral leads showing ST depressions which is unchanged from prior EKG.  Results for orders placed or performed during the hospital encounter of 04/10/22 (from the past 24 hour(s))  Resp Panel by RT-PCR (Flu A&B, Covid) Anterior Nasal  Swab     Status: None   Collection Time: 04/10/22  3:25 PM   Specimen: Anterior Nasal Swab  Result Value Ref Range   SARS Coronavirus 2 by RT PCR NEGATIVE NEGATIVE   Influenza A by PCR NEGATIVE NEGATIVE   Influenza B by PCR NEGATIVE NEGATIVE  CBC with Differential     Status: Abnormal   Collection Time: 04/10/22  3:25 PM  Result Value Ref Range   WBC 17.3 (H) 4.0 - 10.5 K/uL   RBC 3.91 (L) 4.22 - 5.81 MIL/uL   Hemoglobin 10.1 (L) 13.0 - 17.0 g/dL   HCT 33.3 (L) 39.0 - 52.0 %   MCV 85.2 80.0 - 100.0 fL   MCH 25.8 (L) 26.0 - 34.0 pg   MCHC 30.3 30.0 - 36.0 g/dL   RDW 17.3 (H) 11.5 - 15.5 %   Platelets 184 150 - 400 K/uL   nRBC 0.0 0.0 - 0.2 %   Neutrophils Relative % 88 %   Neutro Abs 15.6 (H) 1.7 - 7.7 K/uL   Lymphocytes Relative 5 %   Lymphs Abs 0.8 0.7 - 4.0 K/uL   Monocytes Relative 4 %   Monocytes Absolute 0.6 0.1 - 1.0 K/uL   Eosinophils Relative 1 %   Eosinophils Absolute 0.1 0.0 - 0.5 K/uL   Basophils Relative 1 %   Basophils Absolute 0.1 0.0 - 0.1 K/uL   Immature Granulocytes 1 %   Abs Immature Granulocytes 0.10 (H) 0.00 - 0.07 K/uL  Basic metabolic panel     Status: Abnormal   Collection Time: 04/10/22  3:25 PM  Result Value Ref Range   Sodium 137  135 - 145 mmol/L   Potassium 4.8 3.5 - 5.1 mmol/L   Chloride 101 98 - 111 mmol/L   CO2 26 22 - 32 mmol/L   Glucose, Bld 332 (H) 70 - 99 mg/dL   BUN 23 8 - 23 mg/dL   Creatinine, Ser 6.31 (H) 0.61 - 1.24 mg/dL   Calcium 8.9 8.9 - 10.3 mg/dL   GFR, Estimated 9 (L) >60 mL/min   Anion gap 10 5 - 15  Troponin I (High Sensitivity)     Status: Abnormal   Collection Time: 04/10/22  3:25 PM  Result Value Ref Range   Troponin I (High Sensitivity) 37 (H) <18 ng/L  Lactic acid, plasma     Status: None   Collection Time: 04/10/22  4:17 PM  Result Value Ref Range   Lactic Acid, Venous 0.8 0.5 - 1.9 mmol/L  Blood culture (routine x 2)     Status: None (Preliminary result)   Collection Time: 04/10/22  4:37 PM   Specimen: BLOOD   Result Value Ref Range   Specimen Description BLOOD BLOOD LEFT ARM    Special Requests      BOTTLES DRAWN AEROBIC AND ANAEROBIC Blood Culture adequate volume   Culture      NO GROWTH < 24 HOURS Performed at [facility] [address], [address] [place], [place] [address]    Report Status PENDING   Blood culture (routine x 2)     Status: None (Preliminary result)   Collection Time: 04/10/22  4:37 PM   Specimen: BLOOD  Result Value Ref Range   Specimen Description BLOOD BLOOD LEFT ARM    Special Requests      BOTTLES DRAWN AEROBIC AND ANAEROBIC Blood Culture adequate volume   Culture      NO GROWTH < 24 HOURS Performed at [facility], [address] [place], [place] [address]    Report Status PENDING   Troponin I (High Sensitivity)     Status: Abnormal   Collection Time: 04/10/22  5:18 PM  Result Value Ref Range   Troponin I (High Sensitivity) 98 (H) <18 ng/L  Procalcitonin - Baseline     Status: None   Collection Time: 04/10/22  5:18 PM  Result Value Ref Range   Procalcitonin 0.18 ng/mL  CBC     Status: Abnormal   Collection Time: 04/10/22  5:47 PM  Result Value Ref Range   WBC 13.5 (H) 4.0 - 10.5 K/uL   RBC 3.66 (L) 4.22 - 5.81 MIL/uL   Hemoglobin 9.6 (L) 13.0 - 17.0 g/dL   HCT 31.3 (L) 39.0 - 52.0 %   MCV 85.5 80.0 - 100.0 fL   MCH 26.2 26.0 - 34.0 pg   MCHC 30.7 30.0 - 36.0 g/dL   RDW 17.3 (H) 11.5 - 15.5 %   Platelets 181 150 - 400 K/uL   nRBC 0.0 0.0 - 0.2 %  Creatinine, serum     Status: Abnormal   Collection Time: 04/10/22  5:47 PM  Result Value Ref Range   Creatinine, Ser 6.33 (H) 0.61 - 1.24 mg/dL   GFR, Estimated 9 (L) >60 mL/min  Lactic acid, plasma     Status: None   Collection Time: 04/10/22  6:01 PM  Result Value Ref Range   Lactic Acid, Venous 0.7 0.5 - 1.9 mmol/L  MRSA Next Gen by PCR, Nasal     Status: None   Collection Time: 04/10/22  6:53 PM   Specimen: Nasal Mucosa; Nasal Swab  Result Value  Ref Range   MRSA by PCR Next Gen NOT  DETECTED NOT DETECTED  Troponin I (High Sensitivity)     Status: Abnormal   Collection Time: 04/10/22  8:43 PM  Result Value Ref Range   Troponin I (High Sensitivity) 310 (HH) <18 ng/L  Glucose, capillary     Status: Abnormal   Collection Time: 04/10/22 10:22 PM  Result Value Ref Range   Glucose-Capillary 293 (H) 70 - 99 mg/dL  Troponin I (High Sensitivity)     Status: Abnormal   Collection Time: 04/11/22  6:03 AM  Result Value Ref Range   Troponin I (High Sensitivity) 529 (HH) <18 ng/L  T4, free     Status: Abnormal   Collection Time: 04/11/22  6:03 AM  Result Value Ref Range   Free T4 1.49 (H) 0.61 - 1.12 ng/dL  Comprehensive metabolic panel     Status: Abnormal   Collection Time: 04/11/22  6:08 AM  Result Value Ref Range   Sodium 139 135 - 145 mmol/L   Potassium 4.6 3.5 - 5.1 mmol/L   Chloride 103 98 - 111 mmol/L   CO2 27 22 - 32 mmol/L   Glucose, Bld 134 (H) 70 - 99 mg/dL   BUN 31 (H) 8 - 23 mg/dL   Creatinine, Ser 7.29 (H) 0.61 - 1.24 mg/dL   Calcium 8.9 8.9 - 10.3 mg/dL   Total Protein 6.0 (L) 6.5 - 8.1 g/dL   Albumin 2.6 (L) 3.5 - 5.0 g/dL   AST 16 15 - 41 U/L   ALT 10 0 - 44 U/L   Alkaline Phosphatase 110 38 - 126 U/L   Total Bilirubin 1.2 0.3 - 1.2 mg/dL   GFR, Estimated 7 (L) >60 mL/min   Anion gap 9 5 - 15  CBC     Status: Abnormal   Collection Time: 04/11/22  6:08 AM  Result Value Ref Range   WBC 10.1 4.0 - 10.5 K/uL   RBC 3.48 (L) 4.22 - 5.81 MIL/uL   Hemoglobin 9.1 (L) 13.0 - 17.0 g/dL   HCT 29.5 (L) 39.0 - 52.0 %   MCV 84.8 80.0 - 100.0 fL   MCH 26.1 26.0 - 34.0 pg   MCHC 30.8 30.0 - 36.0 g/dL   RDW 17.2 (H) 11.5 - 15.5 %   Platelets 157 150 - 400 K/uL   nRBC 0.0 0.0 - 0.2 %  TSH     Status: Abnormal   Collection Time: 04/11/22  6:08 AM  Result Value Ref Range   TSH 0.200 (L) 0.350 - 4.500 uIU/mL  Hemoglobin A1c     Status: Abnormal   Collection Time: 04/11/22  6:08 AM  Result Value Ref Range   Hgb A1c MFr Bld 5.9 (H) 4.8 - 5.6 %   Mean Plasma  Glucose 122.63 mg/dL  Glucose, capillary     Status: Abnormal   Collection Time: 04/11/22  6:24 AM  Result Value Ref Range   Glucose-Capillary 129 (H) 70 - 99 mg/dL   DG Chest Portable 1 View  Result Date: 04/10/2022 CLINICAL DATA:  Shortness of breath EXAM: PORTABLE CHEST 1 VIEW COMPARISON:  03/22/2022 FINDINGS: Right-sided tunneled central venous catheter with the tip in the right atrium. Small right pleural effusion. No pneumothorax. Unchanged cardiac and mediastinal contours. Compared to prior exam there are new linear opacities in the right lung base, which are nonspecific, and could represent atelectasis, but infection could have a similar appearance. Prominent interstitial opacities with mild peribronchial cuffing, could suggest a  component of mild pulmonary edema. No displaced rib fractures. There is a left axillary vascular stent in place. IMPRESSION: 1. New patchy opacities in the right lung base are nonspecific, and could represent atelectasis, but infection could have a similar appearance. 2. Trace right pleural effusion. 3. Prominent interstitial opacities with mild peribronchial cuffing, could suggest a component of mild pulmonary edema. Electronically Signed   By: Marin Roberts M.D.   On: 04/10/2022 15:36     ASSESSMENT AND PLAN: Elevated troponin due to demand ischemia versus non-STEMI.  Troponin was originally 37 then 98 then 310 and now a 529.  EKG shows lateral ischemia which is not much changed from prior EKGs.  Patient does not have chest pain but mostly fatigue and shortness of breath.  May start the heparin for 48 hours.  And also control the blood pressure.  He is not a candidate for intervention given his condition and renal failure.  Advised conservative treatment.  Bexton Haak A

## 2022-04-11 NOTE — Progress Notes (Signed)
[facility]  ROUNDING NOTE   Subjective:   Patient is a 76 year old man of Asian/Indian origin with a past medical history of ESRD, on hemodialysis on Tuesday Thursday Saturday schedule, diabetes mellitus type 2, CVA, hypothyroidism who came to the ER with chief complaint of shortness of breath. He has been admitted for Shortness of breath [R06.02] Hypoxia [R09.02] HCAP (healthcare-associated pneumonia) [J18.9] Hypoxic respiratory failure [facility]) [J96.91]  Patient is known to our practice from previous admissions and receives outpatient dialysis treatments at [facility] on a TTS schedule, supervised by [facility] nephrologist.  Last treatment received on Thursday, full treatment without complications.  Patient states he felt well initially but began to have shortness of breath yesterday around lunchtime.  Denies sick contacts.  Patient did have recent admission for COVID-19 infection and continues to to have intermittent cough.  Patient also reports recent diarrhea.  Labs on ED arrival unremarkable for renal patient except elevated glucose at 332.  Respiratory panel negative for influenza and COVID-19.  Chest x-ray shows new patchy opacities in the right lung base with trace pleural effusion and mild pulmonary edema.  We have been consulted to manage dialysis needs.  Objective:  Vital signs in last 24 hours:  Temp:  [97.4 F (36.3 C)-99 F (37.2 C)] 98.1 F (36.7 C) (11/18 0851) Pulse Rate:  [59-85] 61 (11/18 0851) Resp:  [14-21] 16 (11/18 0851) BP: (124-190)/(46-131) 148/58 (11/18 0851) SpO2:  [89 %-100 %] 99 % (11/18 0851) Weight:  [65.6 kg] 65.6 kg (11/17 1446)  Weight change:  Filed Weights   04/10/22 1446  Weight: 65.6 kg    Intake/Output: I/O last 3 completed shifts: In: 350 [IV Piggyback:350] Out: -    Intake/Output this shift:  No intake/output data recorded.  Physical Exam: General: NAD  Head: Normocephalic, atraumatic. Moist oral mucosal  membranes  Eyes: Anicteric  Lungs:  No respiratory distress, diminished bases  Heart: Regular rate and rhythm  Abdomen:  Soft, non-tender, nondistended, JP drain in situ  Extremities: No peripheral edema.  Neurologic: Nonfocal, moving all four extremities  Skin: No lesions  Access: Right chest PermCath    Basic Metabolic Panel: Recent Labs  Lab 04/10/22 1525 04/10/22 1747 04/11/22 0608  NA 137  --  139  K 4.8  --  4.6  CL 101  --  103  CO2 26  --  27  GLUCOSE 332*  --  134*  BUN 23  --  31*  CREATININE 6.31* 6.33* 7.29*  CALCIUM 8.9  --  8.9     Liver Function Tests: Recent Labs  Lab 04/11/22 0608  AST 16  ALT 10  ALKPHOS 110  BILITOT 1.2  PROT 6.0*  ALBUMIN 2.6*    No results for input(s): "LIPASE", "AMYLASE" in the last 168 hours. No results for input(s): "AMMONIA" in the last 168 hours.  CBC: Recent Labs  Lab 04/10/22 1525 04/10/22 1747 04/11/22 0608  WBC 17.3* 13.5* 10.1  NEUTROABS 15.6*  --   --   HGB 10.1* 9.6* 9.1*  HCT 33.3* 31.3* 29.5*  MCV 85.2 85.5 84.8  PLT 184 181 157     Cardiac Enzymes: No results for input(s): "CKTOTAL", "CKMB", "CKMBINDEX", "TROPONINI" in the last 168 hours.  BNP: Invalid input(s): "POCBNP"  CBG: Recent Labs  Lab 04/10/22 2222 04/11/22 0624  GLUCAP 293* 129*     Microbiology: Results for orders placed or performed during the hospital encounter of 04/10/22  Resp Panel by RT-PCR (Flu A&B, Covid) Anterior Nasal  Swab     Status: None   Collection Time: 04/10/22  3:25 PM   Specimen: Anterior Nasal Swab  Result Value Ref Range Status   SARS Coronavirus 2 by RT PCR NEGATIVE NEGATIVE Final    Comment: (NOTE) SARS-CoV-2 target nucleic acids are NOT DETECTED.  The SARS-CoV-2 RNA is generally detectable in upper respiratory specimens during the acute phase of infection. The lowest concentration of SARS-CoV-2 viral copies this assay can detect is 138 copies/mL. A negative result does not preclude  SARS-Cov-2 infection and should not be used as the sole basis for treatment or other patient management decisions. A negative result may occur with  improper specimen collection/handling, submission of specimen other than nasopharyngeal swab, presence of viral mutation(s) within the areas targeted by this assay, and inadequate number of viral copies(<138 copies/mL). A negative result must be combined with clinical observations, patient history, and epidemiological information. The expected result is Negative.  Fact Sheet for Patients:  EntrepreneurPulse.com.au  Fact Sheet for Healthcare Providers:  IncredibleEmployment.be  This test is no t yet approved or cleared by the [place] FDA and  has been authorized for detection and/or diagnosis of SARS-CoV-2 by FDA under an Emergency Use Authorization (EUA). This EUA will remain  in effect (meaning this test can be used) for the duration of the COVID-19 declaration under Section 564(b)(1) of the Act, 21 U.S.C.section 360bbb-3(b)(1), unless the authorization is terminated  or revoked sooner.       Influenza A by PCR NEGATIVE NEGATIVE Final   Influenza B by PCR NEGATIVE NEGATIVE Final    Comment: (NOTE) The Xpert Xpress SARS-CoV-2/FLU/RSV plus assay is intended as an aid in the diagnosis of influenza from Nasopharyngeal swab specimens and should not be used as a sole basis for treatment. Nasal washings and aspirates are unacceptable for Xpert Xpress SARS-CoV-2/FLU/RSV testing.  Fact Sheet for Patients: EntrepreneurPulse.com.au  Fact Sheet for Healthcare Providers: IncredibleEmployment.be  This test is not yet approved or cleared by the [place] FDA and has been authorized for detection and/or diagnosis of SARS-CoV-2 by FDA under an Emergency Use Authorization (EUA). This EUA will remain in effect (meaning this test can be used) for the duration of  the COVID-19 declaration under Section 564(b)(1) of the Act, 21 U.S.C. section 360bbb-3(b)(1), unless the authorization is terminated or revoked.  Performed at [facility], [address] [place], [place] [address]   Blood culture (routine x 2)     Status: None (Preliminary result)   Collection Time: 04/10/22  4:37 PM   Specimen: BLOOD  Result Value Ref Range Status   Specimen Description BLOOD BLOOD LEFT ARM  Final   Special Requests   Final    BOTTLES DRAWN AEROBIC AND ANAEROBIC Blood Culture adequate volume   Culture   Final    NO GROWTH < 24 HOURS Performed at [facility], [address], [place], [place] [address]    Report Status PENDING  Incomplete  Blood culture (routine x 2)     Status: None (Preliminary result)   Collection Time: 04/10/22  4:37 PM   Specimen: BLOOD  Result Value Ref Range Status   Specimen Description BLOOD BLOOD LEFT ARM  Final   Special Requests   Final    BOTTLES DRAWN AEROBIC AND ANAEROBIC Blood Culture adequate volume   Culture   Final    NO GROWTH < 24 HOURS Performed at [facility], [address] [place], [place] [address]    Report Status PENDING  Incomplete  MRSA Next Gen by PCR, Nasal     Status: None   Collection Time: 04/10/22  6:53 PM   Specimen: Nasal Mucosa; Nasal Swab  Result Value Ref Range Status   MRSA by PCR Next Gen NOT DETECTED NOT DETECTED Final    Comment: (NOTE) The GeneXpert MRSA Assay (FDA approved for NASAL specimens only), is one component of a comprehensive MRSA colonization surveillance program. It is not intended to diagnose MRSA infection nor to guide or monitor treatment for MRSA infections. Test performance is not FDA approved in patients less than 42 years old. Performed at [facility], [address] [place],  [address]     Coagulation Studies: No results for input(s): "LABPROT", "INR" in the last 72 hours.    Urinalysis: No results for input(s):  "COLORURINE", "LABSPEC", "PHURINE", "GLUCOSEU", "HGBUR", "BILIRUBINUR", "KETONESUR", "PROTEINUR", "UROBILINOGEN", "NITRITE", "LEUKOCYTESUR" in the last 72 hours.  Invalid input(s): "APPERANCEUR"    Imaging: DG Chest Portable 1 View  Result Date: 04/10/2022 CLINICAL DATA:  Shortness of breath EXAM: PORTABLE CHEST 1 VIEW COMPARISON:  03/22/2022 FINDINGS: Right-sided tunneled central venous catheter with the tip in the right atrium. Small right pleural effusion. No pneumothorax. Unchanged cardiac and mediastinal contours. Compared to prior exam there are new linear opacities in the right lung base, which are nonspecific, and could represent atelectasis, but infection could have a similar appearance. Prominent interstitial opacities with mild peribronchial cuffing, could suggest a component of mild pulmonary edema. No displaced rib fractures. There is a left axillary vascular stent in place. IMPRESSION: 1. New patchy opacities in the right lung base are nonspecific, and could represent atelectasis, but infection could have a similar appearance. 2. Trace right pleural effusion. 3. Prominent interstitial opacities with mild peribronchial cuffing, could suggest a component of mild pulmonary edema. Electronically Signed   By: Marin Roberts M.D.   On: 04/10/2022 15:36     Medications:    anticoagulant sodium citrate     ceFEPime (MAXIPIME) IV     heparin      aspirin EC  81 mg Oral Daily   atorvastatin  40 mg Oral Daily   calcium acetate  1,334 mg Oral TID WC   Chlorhexidine Gluconate Cloth  6 each Topical Q0600   chlorproMAZINE  25 mg Oral TID   cholecalciferol  5,000 Units Oral Daily   clopidogrel  75 mg Oral Daily   donepezil  10 mg Oral Daily   gabapentin  300 mg Oral QHS   heparin  3,900 Units Intravenous Once   insulin aspart  0-5 Units Subcutaneous QHS   insulin aspart  0-9 Units Subcutaneous TID WC   insulin aspart  2 Units Subcutaneous TID WC   insulin glargine-yfgn  10 Units  Subcutaneous QHS   isosorbide-hydrALAZINE  1 tablet Oral TID   levothyroxine  137 mcg Oral Q0600   losartan  100 mg Oral QPM   metoprolol succinate  50 mg Oral Daily   multivitamin  1 tablet Oral Daily   pantoprazole  40 mg Oral Daily   sevelamer carbonate  2.4 g Oral Q breakfast   vancomycin variable dose per unstable renal function (pharmacist dosing)   Does not apply See admin instructions   acetaminophen **OR** acetaminophen, alteplase, anticoagulant sodium citrate, heparin, ipratropium-albuterol, lidocaine (PF), lidocaine-prilocaine, metoprolol tartrate, ondansetron **OR** ondansetron (ZOFRAN) IV, oxyCODONE, pentafluoroprop-tetrafluoroeth  Chest x-ray done on March 09, 2022 IMPRESSION: 1. Cardiomegaly with vascular congestion and pulmonary edema 2. Worsening airspace disease at the right  greater than left lung base, atelectasis versus pneumonia       Assessment/ Plan:  Mr. Ivin Rosenbloom is a 76 y.o.  male with a past medical history of hypertension, end-stage renal disease, on hemodialysis on Monday Wednesday Friday schedule, diabetes mellitus, CVA who was brought to the ER with chief complaint of weakness.  [facility]/TTS/right chest PermCath  End-stage renal disease on hemodialysis.         Patient is on Tuesday Thursday Saturday schedule as an outpatient     Last treatment received on Thursday, no complications.  Patient will receive scheduled dialysis today.  Hepatitis B panel ordered per hospital protocol.  Next treatment scheduled for Tuesday.    2. Anemia of chronic kidney disease  Lab Results  Component Value Date   HGB 9.1 (L) 04/11/2022    Hemoglobin at desired target.  Patient receives [place] outpatient.  3. Secondary Hyperparathyroidism: with outpatient labs: PTH 422, phosphorus 6.4, calcium 9.1 on 12/29/2021.   Lab Results  Component Value Date   CALCIUM 8.9 04/11/2022   CAION 0.83 (LL) 12/10/2020   PHOS 4.1 03/12/2022    We will continue to  monitor bone minerals during this admission.  Currently calcium and phosphorus within desired range.  Continue calcium acetate with meals.  4.  Hypertension with chronic kidney disease.  Home regimen includes hydralazine losartan, and metoprolol.   Blood pressure stable, 127/59.  5. Diabetes mellitus type II with chronic kidney disease insulin dependent. Home regimen includes NovoLog and Levemir. Most recent hemoglobin A1c is 8.1 on 01/18/2022.  Currently prescribed Januvia outpatient.  Currently held.  Glucose elevated on ED arrival.  Primary team to manage sliding scale insulin.    LOS: 1   11/18/202311:58 AM

## 2022-04-11 NOTE — Progress Notes (Signed)
Post hd rn assessment 

## 2022-04-11 NOTE — Progress Notes (Addendum)
PROGRESS NOTE    Joseph Mcpherson   UMP:[id number] DOB: 09-29-1945  DOA: 04/10/2022 Date of Service: 04/11/22 PCP: [name], MD     Brief Narrative / Hospital Course:  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 Recently hospitalized w/ COVID pneumonia 03/22/22-03/26/22, and for NSTEMI 03/09/22-03/13/22.   HPI: Pt brought to ED from home via EMS for SOB. Felt okay yesterday and this morning was feeling very tired and easily worn out / short winded w/ minimal activity. Wife describes associated sweating but no chest pain or fever, no cough. SHe states he had one runny BM this morning, nonbloody, no persistent diarrhea. He continued to feel poorly thoguth home PT today. Family called EMS. Per EMS hypertensive w/ BP 212/110, SpO2 92% RA and dropped to 89% w/ minimal activity, hyperglycemic.   Hospital course:  11/17: in ED, VS hypoxic on RA down to 88-89%, not on home O2, improved on [place] 2L. Afebrile. WBC elevated to 17.0, but not meeting sepsis criteria. Hypertensive. CXR concerning for pulmonary edema vs pneumonia. BNP elevated to 4110. Cr c/w known ESRD. Hgb baseline at 10.1. Troponin c/w ESRD. Hyperglycemic. EDP started Tx for HCAP (given recent admission to hospital) w/ cefepime and vancomycin. Hospitalist consulted --> admitted to inpatient for acute hypoxic respiratory failure d/t HFrEF w/ volume overload versus/in addition to HCAP.  11/18: tropes up to 500+, cardiology consulted (Dr. Ronny Flurry) and started heparin gtt x48 hours. TSH low, added T3/T4. Procal and MRSA no concerns --> deescalate abx to d/c vanc. HD today. BP better today. Curbside w/ pulmonary (Dr. Lanney Gins) - agree w/ consider CT to reeval pleural effusions if no improvement after dialysis.   Consultants:  Nephrology    Procedures: none      ASSESSMENT & PLAN:   Active Problems:   Acute respiratory failure with hypoxia (HCC)   End stage renal disease (HCC)   Essential hypertension   Insulin dependent type 2 diabetes mellitus (HCC)   Hypothyroidism   Mixed hyperlipidemia   Dementia (HCC)   CAD (coronary artery disease)   Leukocytosis   Anemia due to chronic kidney disease   Acute HFrEF (heart failure with reduced ejection fraction) (HCC)   Acute respiratory failure with hypoxia (HCC) Acute HFrEF (heart failure with reduced ejection fraction) (HCC) Ddx: fluid overload in setting of HFrEF, possible HCAP Treat HCAP: no rhonchi on exam or definitive CXR findings but elevated WBC and hypoxia support pneumonia diagnosis, suspect more likely fluid overload Abx w/ cefepime and vanc to cover HCAP --> deescalate pending results and clinical course  Procalcitonin --> <0.25 = consider d/c abx if resp status improves following dialysis  MRSA screen --> negative  Await sputum culture  Await blood cultures --> NG <24h Consider CT chest if clinically worsening  Treat fluid overload HFrEF given (+)rales and pulmonary edema on CXR, known pleural effusion Anticipate dialysis today for fluid removal - nephrology team consulted and aware Pt does make some urine, will trial IV lasix x1 on admission  Recent echocardiogram, will not repeat I&O Consider CT chest if clinically worsening - may need thoracentesis / confirm if PNA Continue supplemental O2   Leukocytosis Loose stool in setting of recent hospitalization Ddx includes HCAP as above, CDiff vs viral gastroenteritis, les slikely infection from biliary tube or HD cath  but keep this in mind No abdominal pain or rash Await CDiff screen but this seems unlikely, no further BM and WBC improving  Consider CT chest/abd/pelvis if clinically worsening  Await blood cultures --> NG <24h  CAD (coronary artery disease) NSTEMI No chest pain  Continue BP  control, ASA, Statin, Plavix  troponin increased overnight, repeat again this AM trending up --> consulted cardiology (Dr Humphrey Rolls) plan for heparin gtt 48h, not candidate for intervention at this time   End stage renal disease ([place]) HD per nephrology team   Essential hypertension Home meds Prn metoprolol   Insulin dependent type 2 diabetes mellitus (HCC) SSI and basal insulin A1C  Hypothyroidism w/ low TSH  holding home levothyroxine  No recent TSH and last 12/2021 was abnormal, will check TSH (given acute illness, would not change dose synthroid unless significantly abnormal) --> TSH 0.200 adding on T3 and Free T4   Mixed hyperlipidemia Continue statin   Dementia (HCC) Continue donepezil   Hx CVA Continue BP control, ASA, Statin, Plavix   Anemia due to chronic kidney disease Trend CBC   DVT prophylaxis: heparin gtt  Pertinent IV fluids/nutrition: no continuous IV fluids Central lines / invasive devices: dialysis catheter R subclavian   Code Status: FULL CODE confirmed w/ patient and family in the ED on admission by Dr Sheppard Coil   Disposition: inpatient  TOC needs: pending clinical course              Subjective:  Patient reports no concerns, still some SOB Denies CP  Pain controlled.  Denies new weakness.  Tolerating diet but low appetite which is chronic.  Reports no concerns w/ urination/defecation.   Family Communication: spoke w/ wife at bedside and son over the phone     Objective Findings:  Vitals:   04/11/22 0112 04/11/22 0619 04/11/22 0851 04/11/22 1200  BP: (!) 143/55 (!) 124/46 (!) 148/58 (!) 127/59  Pulse: 63 (!) 59 61 (!) 59  Resp: '20 16 16 16  '$ Temp: 99 F (37.2 C) (!) 97.4 F (36.3 C) 98.1 F (36.7 C) 99.2 F (37.3 C)  TempSrc:  Oral    SpO2: 100% 99% 99% 100%  Weight:      Height:        Intake/Output Summary (Last 24 hours) at 04/11/2022 1229 Last data filed at 04/10/2022 2202 Gross per 24 hour  Intake 350 ml  Output --   Net 350 ml   Filed Weights   04/10/22 1446  Weight: 65.6 kg    Examination:  Constitutional:  VS as above General Appearance: alert, well-developed, well-nourished, NAD Eyes: Normal lids and conjunctive, non-icteric sclera Ears, Nose, Mouth, Throat: Normal external appearance MMM Respiratory: Normal respiratory effort No wheeze No rhonchi + rales Cardiovascular: S1/S2 normal No murmur RRR No rub/gallop auscultated No JVD No lower extremity edema Gastrointestinal: No tenderness Musculoskeletal:  No clubbing/cyanosis of digits Symmetrical movement in all extremities Neurological: No cranial nerve deficit on limited exam Alert Psychiatric: Normal judgment/insight Normal mood and affect       Scheduled Medications:   aspirin EC  81 mg Oral Daily   atorvastatin  40 mg Oral Daily   calcium acetate  1,334 mg Oral TID WC   Chlorhexidine Gluconate Cloth  6 each Topical Q0600   chlorproMAZINE  25 mg Oral TID   cholecalciferol  5,000 Units Oral Daily   clopidogrel  75 mg Oral Daily   donepezil  10 mg Oral Daily   gabapentin  300 mg Oral QHS  heparin  3,900 Units Intravenous Once   insulin aspart  0-5 Units Subcutaneous QHS   insulin aspart  0-9 Units Subcutaneous TID WC   insulin aspart  2 Units Subcutaneous TID WC   insulin glargine-yfgn  10 Units Subcutaneous QHS   isosorbide-hydrALAZINE  1 tablet Oral TID   losartan  100 mg Oral QPM   metoprolol succinate  50 mg Oral Daily   multivitamin  1 tablet Oral Daily   pantoprazole  40 mg Oral Daily   sevelamer carbonate  2.4 g Oral Q breakfast   vancomycin variable dose per unstable renal function (pharmacist dosing)   Does not apply See admin instructions    Continuous Infusions:  anticoagulant sodium citrate     ceFEPime (MAXIPIME) IV     heparin      PRN Medications:  acetaminophen **OR** acetaminophen, alteplase, anticoagulant sodium citrate, heparin, ipratropium-albuterol, lidocaine (PF),  lidocaine-prilocaine, metoprolol tartrate, ondansetron **OR** ondansetron (ZOFRAN) IV, oxyCODONE, pentafluoroprop-tetrafluoroeth  Antimicrobials:  Anti-infectives (From admission, onward)    Start     Dose/Rate Route Frequency Ordered Stop   04/11/22 1800  ceFEPIme (MAXIPIME) 1 g in sodium chloride 0.9 % 100 mL IVPB        1 g 200 mL/hr over 30 Minutes Intravenous Every 24 hours 04/10/22 1829     04/10/22 1829  vancomycin variable dose per unstable renal function (pharmacist dosing)         Does not apply See admin instructions 04/10/22 1829     04/10/22 1615  vancomycin (VANCOCIN) IVPB 1000 mg/200 mL premix  Status:  Discontinued        1,000 mg 200 mL/hr over 60 Minutes Intravenous  Once 04/10/22 1600 04/10/22 1614   04/10/22 1615  ceFEPIme (MAXIPIME) 2 g in sodium chloride 0.9 % 100 mL IVPB        2 g 200 mL/hr over 30 Minutes Intravenous  Once 04/10/22 1600 04/10/22 1711   04/10/22 1615  vancomycin (VANCOREADY) IVPB 1250 mg/250 mL        1,250 mg 166.7 mL/hr over 90 Minutes Intravenous  Once 04/10/22 1614 04/10/22 1852           Data Reviewed: I have personally reviewed following labs and imaging studies  CBC: Recent Labs  Lab 04/10/22 1525 04/10/22 1747 04/11/22 0608  WBC 17.3* 13.5* 10.1  NEUTROABS 15.6*  --   --   HGB 10.1* 9.6* 9.1*  HCT 33.3* 31.3* 29.5*  MCV 85.2 85.5 84.8  PLT 184 181 333   Basic Metabolic Panel: Recent Labs  Lab 04/10/22 1525 04/10/22 1747 04/11/22 0608  NA 137  --  139  K 4.8  --  4.6  CL 101  --  103  CO2 26  --  27  GLUCOSE 332*  --  134*  BUN 23  --  31*  CREATININE 6.31* 6.33* 7.29*  CALCIUM 8.9  --  8.9   GFR: Estimated Creatinine Clearance: 7.2 mL/min (A) (by C-G formula based on SCr of 7.29 mg/dL (H)). Liver Function Tests: Recent Labs  Lab 04/11/22 0608  AST 16  ALT 10  ALKPHOS 110  BILITOT 1.2  PROT 6.0*  ALBUMIN 2.6*   No results for input(s): "LIPASE", "AMYLASE" in the last 168 hours. No results for  input(s): "AMMONIA" in the last 168 hours. Coagulation Profile: Recent Labs  Lab 04/11/22 1144  INR 1.0   Cardiac Enzymes: No results for input(s): "CKTOTAL", "CKMB", "CKMBINDEX", "TROPONINI" in the last 168 hours. BNP (last 3  results) No results for input(s): "PROBNP" in the last 8760 hours. HbA1C: Recent Labs    04/11/22 0608  HGBA1C 5.9*   CBG: Recent Labs  Lab 04/10/22 2222 04/11/22 0624 04/11/22 1201  GLUCAP 293* 129* 135*   Lipid Profile: No results for input(s): "CHOL", "HDL", "LDLCALC", "TRIG", "CHOLHDL", "LDLDIRECT" in the last 72 hours. Thyroid Function Tests: Recent Labs    04/11/22 0603 04/11/22 0608  TSH  --  0.200*  FREET4 1.49*  --    Anemia Panel: No results for input(s): "VITAMINB12", "FOLATE", "FERRITIN", "TIBC", "IRON", "RETICCTPCT" in the last 72 hours. Most Recent Urinalysis On File:     Component Value Date/Time   COLORURINE Straw 09/11/2013 1933   APPEARANCEUR Clear 09/11/2013 1933   LABSPEC 1.016 09/11/2013 1933   PHURINE 7.0 09/11/2013 1933   GLUCOSEU >=500 09/11/2013 1933   HGBUR Negative 09/11/2013 1933   BILIRUBINUR Negative 09/11/2013 1933   KETONESUR Negative 09/11/2013 1933   PROTEINUR 100 mg/dL 09/11/2013 1933   NITRITE Negative 09/11/2013 1933   LEUKOCYTESUR Negative 09/11/2013 1933   Sepsis Labs: '@LABRCNTIP'$ (procalcitonin:4,lacticidven:4)  Recent Results (from the past 240 hour(s))  Resp Panel by RT-PCR (Flu A&B, Covid) Anterior Nasal Swab     Status: None   Collection Time: 04/10/22  3:25 PM   Specimen: Anterior Nasal Swab  Result Value Ref Range Status   SARS Coronavirus 2 by RT PCR NEGATIVE NEGATIVE Final    Comment: (NOTE) SARS-CoV-2 target nucleic acids are NOT DETECTED.  The SARS-CoV-2 RNA is generally detectable in upper respiratory specimens during the acute phase of infection. The lowest concentration of SARS-CoV-2 viral copies this assay can detect is 138 copies/mL. A negative result does not preclude  SARS-Cov-2 infection and should not be used as the sole basis for treatment or other patient management decisions. A negative result may occur with  improper specimen collection/handling, submission of specimen other than nasopharyngeal swab, presence of viral mutation(s) within the areas targeted by this assay, and inadequate number of viral copies(<138 copies/mL). A negative result must be combined with clinical observations, patient history, and epidemiological information. The expected result is Negative.  Fact Sheet for Patients:  EntrepreneurPulse.com.au  Fact Sheet for Healthcare Providers:  IncredibleEmployment.be  This test is no t yet approved or cleared by the [place] FDA and  has been authorized for detection and/or diagnosis of SARS-CoV-2 by FDA under an Emergency Use Authorization (EUA). This EUA will remain  in effect (meaning this test can be used) for the duration of the COVID-19 declaration under Section 564(b)(1) of the Act, 21 U.S.C.section 360bbb-3(b)(1), unless the authorization is terminated  or revoked sooner.       Influenza A by PCR NEGATIVE NEGATIVE Final   Influenza B by PCR NEGATIVE NEGATIVE Final    Comment: (NOTE) The Xpert Xpress SARS-CoV-2/FLU/RSV plus assay is intended as an aid in the diagnosis of influenza from Nasopharyngeal swab specimens and should not be used as a sole basis for treatment. Nasal washings and aspirates are unacceptable for Xpert Xpress SARS-CoV-2/FLU/RSV testing.  Fact Sheet for Patients: EntrepreneurPulse.com.au  Fact Sheet for Healthcare Providers: IncredibleEmployment.be  This test is not yet approved or cleared by the [place] FDA and has been authorized for detection and/or diagnosis of SARS-CoV-2 by FDA under an Emergency Use Authorization (EUA). This EUA will remain in effect (meaning this test can be used) for the duration of  the COVID-19 declaration under Section 564(b)(1) of the Act, 21 U.S.C. section 360bbb-3(b)(1), unless the authorization is terminated  or revoked.  Performed at [facility], [address] [place], [place] [address]   Blood culture (routine x 2)     Status: None (Preliminary result)   Collection Time: 04/10/22  4:37 PM   Specimen: BLOOD  Result Value Ref Range Status   Specimen Description BLOOD BLOOD LEFT ARM  Final   Special Requests   Final    BOTTLES DRAWN AEROBIC AND ANAEROBIC Blood Culture adequate volume   Culture   Final    NO GROWTH < 24 HOURS Performed at [facility], [address], [place], [place] [address]    Report Status PENDING  Incomplete  Blood culture (routine x 2)     Status: None (Preliminary result)   Collection Time: 04/10/22  4:37 PM   Specimen: BLOOD  Result Value Ref Range Status   Specimen Description BLOOD BLOOD LEFT ARM  Final   Special Requests   Final    BOTTLES DRAWN AEROBIC AND ANAEROBIC Blood Culture adequate volume   Culture   Final    NO GROWTH < 24 HOURS Performed at [facility], [address], [place], [place] [address]    Report Status PENDING  Incomplete  MRSA Next Gen by PCR, Nasal     Status: None   Collection Time: 04/10/22  6:53 PM   Specimen: Nasal Mucosa; Nasal Swab  Result Value Ref Range Status   MRSA by PCR Next Gen NOT DETECTED NOT DETECTED Final    Comment: (NOTE) The GeneXpert MRSA Assay (FDA approved for NASAL specimens only), is one component of a comprehensive MRSA colonization surveillance program. It is not intended to diagnose MRSA infection nor to guide or monitor treatment for MRSA infections. Test performance is not FDA approved in patients less than 21 years old. Performed at [facility], [address] [place], [place] [address]          Radiology Studies: DG Chest Portable 1 View  Result Date: 04/10/2022 CLINICAL DATA:  Shortness of breath EXAM:  PORTABLE CHEST 1 VIEW COMPARISON:  03/22/2022 FINDINGS: Right-sided tunneled central venous catheter with the tip in the right atrium. Small right pleural effusion. No pneumothorax. Unchanged cardiac and mediastinal contours. Compared to prior exam there are new linear opacities in the right lung base, which are nonspecific, and could represent atelectasis, but infection could have a similar appearance. Prominent interstitial opacities with mild peribronchial cuffing, could suggest a component of mild pulmonary edema. No displaced rib fractures. There is a left axillary vascular stent in place. IMPRESSION: 1. New patchy opacities in the right lung base are nonspecific, and could represent atelectasis, but infection could have a similar appearance. 2. Trace right pleural effusion. 3. Prominent interstitial opacities with mild peribronchial cuffing, could suggest a component of mild pulmonary edema. Electronically Signed   By: Marin Roberts M.D.   On: 04/10/2022 15:36            LOS: 1 day    Time spent: 50 mins     Emeterio Reeve, DO [facility] 04/11/2022, 12:29 PM    Dictation software may have been used to generate the above note. Typos may occur and escape review in typed/dictated notes. Please contact Dr Sheppard Coil directly for clarity if needed.  Staff may message me via secure chat in [place]  but this may not receive an immediate response,  please page me for urgent matters!  If 7PM-7AM, please contact night coverage [email or web address]

## 2022-04-11 NOTE — Progress Notes (Signed)
Pre hd rn assessment 

## 2022-04-11 NOTE — Consult Note (Addendum)
ANTICOAGULATION CONSULT NOTE - Initial Consult  Pharmacy Consult for heparin Indication: chest pain/ACS  No Known Allergies  Patient Measurements: Height: '5\' 4"'$  (162.6 cm) Weight: 65.6 kg (144 lb 9.6 oz) IBW/kg (Calculated) : 59.2 Heparin Dosing Weight: 65.6  Vital Signs: Temp: 98.1 F (36.7 C) (11/18 0851) Temp Source: Oral (11/18 0619) BP: 148/58 (11/18 0851) Pulse Rate: 61 (11/18 0851)  Labs: Recent Labs    04/10/22 1525 04/10/22 1718 04/10/22 1747 04/10/22 2043 04/11/22 0603 04/11/22 0608  HGB 10.1*  --  9.6*  --   --  9.1*  HCT 33.3*  --  31.3*  --   --  29.5*  PLT 184  --  181  --   --  157  CREATININE 6.31*  --  6.33*  --   --  7.29*  TROPONINIHS 37* 98*  --  310* 529*  --     Estimated Creatinine Clearance: 7.2 mL/min (A) (by C-G formula based on SCr of 7.29 mg/dL (H)).  Medical History: Past Medical History:  Diagnosis Date   Clotting disorder ([place])    per daughter, "when patient travels, his feet swell"   Diabetes mellitus without complication ([place])    type II   Diabetic eye exam ([place]) 12/2019   ESRD (end stage renal disease) ([place])    MWF- [facility]   GERD (gastroesophageal reflux disease)    [name] (hard of hearing)    Hyperlipidemia    Hypertension    Hypothyroidism    Thyroid disease     Medications:  No chronic anticoagulation PTA noted from chart review Clopidogrel 75 mg daily bASA PTA  Assessment: 76 yo M presents with SOB, fatigue and weakness. Troponins trending up, most recently 529. ECG shows lateral ischemia and patient is not a candidate for PCI given condition and renal failure. Heparin to run for 48 hrs.  Date Time aPTT/HL Rate/Comment       Baseline Labs: aPTT - 37; INR - 1.0 Hgb - 9.6; Plts - 181  Goal of Therapy:  Heparin level 0.3-0.7 units/ml Monitor platelets by anticoagulation protocol: Yes   Plan: Give 3900 units bolus x1; then start heparin infusion at 750 units/hr Check anti-Xa level in 8 hours and daily once  consecutively therapeutic. Continue to monitor H&H and platelets daily while on heparin gtt.  Dara Hoyer, PharmD PGY-1 Pharmacy Resident 04/11/2022 11:52 AM

## 2022-04-12 DIAGNOSIS — N186 End stage renal disease: Secondary | ICD-10-CM | POA: Diagnosis not present

## 2022-04-12 DIAGNOSIS — E039 Hypothyroidism, unspecified: Secondary | ICD-10-CM | POA: Diagnosis not present

## 2022-04-12 DIAGNOSIS — I2510 Atherosclerotic heart disease of native coronary artery without angina pectoris: Secondary | ICD-10-CM | POA: Diagnosis not present

## 2022-04-12 DIAGNOSIS — J9601 Acute respiratory failure with hypoxia: Secondary | ICD-10-CM | POA: Diagnosis not present

## 2022-04-12 LAB — GLUCOSE, CAPILLARY
Glucose-Capillary: 131 mg/dL — ABNORMAL HIGH (ref 70–99)
Glucose-Capillary: 136 mg/dL — ABNORMAL HIGH (ref 70–99)
Glucose-Capillary: 145 mg/dL — ABNORMAL HIGH (ref 70–99)
Glucose-Capillary: 196 mg/dL — ABNORMAL HIGH (ref 70–99)
Glucose-Capillary: 98 mg/dL (ref 70–99)

## 2022-04-12 LAB — CBC
HCT: 30.4 % — ABNORMAL LOW (ref 39.0–52.0)
Hemoglobin: 9.4 g/dL — ABNORMAL LOW (ref 13.0–17.0)
MCH: 26.3 pg (ref 26.0–34.0)
MCHC: 30.9 g/dL (ref 30.0–36.0)
MCV: 85.2 fL (ref 80.0–100.0)
Platelets: 170 10*3/uL (ref 150–400)
RBC: 3.57 MIL/uL — ABNORMAL LOW (ref 4.22–5.81)
RDW: 17 % — ABNORMAL HIGH (ref 11.5–15.5)
WBC: 7.3 10*3/uL (ref 4.0–10.5)
nRBC: 0 % (ref 0.0–0.2)

## 2022-04-12 LAB — HEPARIN LEVEL (UNFRACTIONATED)
Heparin Unfractionated: 0.6 IU/mL (ref 0.30–0.70)
Heparin Unfractionated: 0.34 IU/mL (ref 0.30–0.70)

## 2022-04-12 LAB — C DIFFICILE QUICK SCREEN W PCR REFLEX
C Diff antigen: NEGATIVE
C Diff interpretation: NOT DETECTED
C Diff toxin: NEGATIVE

## 2022-04-12 NOTE — TOC Initial Note (Signed)
Transition of Care [facility]) - Initial/Assessment Note    Patient Details  Name: Joseph Mcpherson MRN: [id number] Date of Birth: 1945-07-31  Transition of Care [facility]) CM/SW Contact:    Tiburcio Bash, LCSW Phone Number: 04/12/2022, 9:55 AM  Clinical Narrative:                  Patient with frequent readmissions.    Patient is from home with wife, PCP is [name], MD. Wife drives patient to appointments and HD.   [facility] [address]. No issues obtaining medications. Patient is active with [facility] PT OT and RN. Will need resumption orders at dc.    TOC will follow for additional needs.  Expected Discharge Plan: [place] Barriers to Discharge: Continued Medical Work up   Patient Goals and CMS Choice Patient states their goals for this hospitalization and ongoing recovery are:: to go home CMS Medicare.gov Compare Post Acute Care list provided to:: Patient Choice offered to / list presented to : Patient  Expected Discharge Plan and Services Expected Discharge Plan: [place]       Living arrangements for the past 2 months: Single Family Home                           HH Arranged: PT, RN, OT HH Agency: [facility]        Prior Living Arrangements/Services Living arrangements for the past 2 months: Single Family Home Lives with:: Relatives                   Activities of Daily Living      Permission Sought/Granted                  Emotional Assessment              Admission diagnosis:  Shortness of breath [R06.02] Hypoxia [R09.02] HCAP (healthcare-associated pneumonia) [J18.9] Hypoxic respiratory failure (HCC) [J96.91] Patient Active Problem List   Diagnosis Date Noted   Leukocytosis 04/10/2022   Anemia due to chronic kidney disease 04/10/2022   Acute HFrEF (heart failure with reduced ejection fraction) ([place]) 04/10/2022   Hypoxic respiratory failure ([place]) 04/10/2022    Pneumonia due to COVID-19 virus 03/22/2022   Acute respiratory failure with hypoxia ([place]) 03/22/2022   Dementia ([place]) 03/22/2022   Multifocal pneumonia 03/22/2022   CAD (coronary artery disease) 03/22/2022   NSTEMI (non-ST elevated myocardial infarction) ([place]) 03/09/2022   Acute clinical systolic heart failure ([place]) 03/09/2022   Volume overload 03/09/2022   CAP (community acquired pneumonia) 03/09/2022   Febrile illness    Cholecystitis, acute    Hyperkalemia 01/18/2022   Generalized weakness 01/18/2022   Hyponatremia 01/18/2022   Acute bronchitis and bronchiolitis 06/16/2021   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   Allergies 10/28/2020   Gastroesophageal reflux disease with esophagitis 07/18/2020   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   Annual physical exam 03/17/2020   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   Acute CVA (cerebrovascular accident) ([place]) 05/04/2019   History of CVA (cerebrovascular accident) 41/93/7902   Complication of vascular access for dialysis 04/27/2019   Surgery follow-up examination 03/09/2019   End stage renal disease ([place]) 02/09/2018   Anemia in chronic kidney disease 02/09/2018   Pruritus, unspecified 02/09/2018   Peripheral vascular disease ([place]) 03/16/2016   Headache, unspecified 03/16/2016   Diabetic neuropathy associated with diabetes  mellitus due to underlying condition ([place]) 03/16/2016   Mixed hyperlipidemia 07/15/2015   Hypothyroidism 07/14/2015   Hearing impaired 03/19/2015   Essential hypertension 11/27/2014   Insulin dependent type 2 diabetes mellitus ([place]) 11/19/2014   Pulmonary embolism ([place]) 09/13/2014   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   PCP:  [name], MD [facility]   [id number] [place] [id number]: [phone]: [phone]    Social Determinants of Health (SDOH)  Interventions    Readmission Risk Interventions    03/23/2022    2:26 PM 03/13/2022    1:51 PM  Readmission Risk Prevention Plan  Transportation Screening Complete Complete  Medication Review ([place] Complete Complete  PCP or Specialist appointment within 3-5 days of discharge  Complete  HRI or [place] Complete  SW Recovery Care/Counseling Consult Complete Complete  Palliative Care Screening Not Applicable Not AW[place]Not Applicable Not Applicable

## 2022-04-12 NOTE — Plan of Care (Signed)
  Problem: Education: Goal: Knowledge of risk factors and measures for prevention of condition will improve Outcome: Progressing   Problem: Coping: Goal: Psychosocial and spiritual needs will be supported Outcome: Progressing   Problem: Respiratory: Goal: Will maintain a patent airway Outcome: Progressing Goal: Complications related to the disease process, condition or treatment will be avoided or minimized Outcome: Progressing   Problem: Education: Goal: Ability to describe self-care measures that may prevent or decrease complications (Diabetes Survival Skills Education) will improve Outcome: Progressing Goal: Individualized Educational Video(s) Outcome: Progressing   Problem: Coping: Goal: Ability to adjust to condition or change in health will improve Outcome: Progressing   Problem: Fluid Volume: Goal: Ability to maintain a balanced intake and output will improve Outcome: Progressing   Problem: Health Behavior/Discharge Planning: Goal: Ability to identify and utilize available resources and services will improve Outcome: Progressing Goal: Ability to manage health-related needs will improve Outcome: Progressing   Problem: Metabolic: Goal: Ability to maintain appropriate glucose levels will improve Outcome: Progressing   Problem: Nutritional: Goal: Maintenance of adequate nutrition will improve Outcome: Progressing Goal: Progress toward achieving an optimal weight will improve Outcome: Progressing   Problem: Skin Integrity: Goal: Risk for impaired skin integrity will decrease Outcome: Progressing   Problem: Tissue Perfusion: Goal: Adequacy of tissue perfusion will improve Outcome: Progressing   Problem: Education: Goal: Knowledge of General Education information will improve Description: Including pain rating scale, medication(s)/side effects and non-pharmacologic comfort measures Outcome: Progressing   Problem: Health Behavior/Discharge Planning: Goal:  Ability to manage health-related needs will improve Outcome: Progressing   Problem: Clinical Measurements: Goal: Ability to maintain clinical measurements within normal limits will improve Outcome: Progressing Goal: Will remain free from infection Outcome: Progressing Goal: Diagnostic test results will improve Outcome: Progressing Goal: Respiratory complications will improve Outcome: Progressing Goal: Cardiovascular complication will be avoided Outcome: Progressing   Problem: Activity: Goal: Risk for activity intolerance will decrease Outcome: Progressing   Problem: Nutrition: Goal: Adequate nutrition will be maintained Outcome: Progressing   Problem: Coping: Goal: Level of anxiety will decrease Outcome: Progressing   Problem: Elimination: Goal: Will not experience complications related to bowel motility Outcome: Progressing Goal: Will not experience complications related to urinary retention Outcome: Progressing   Problem: Pain Managment: Goal: General experience of comfort will improve Outcome: Progressing   Problem: Safety: Goal: Ability to remain free from injury will improve Outcome: Progressing   Problem: Skin Integrity: Goal: Risk for impaired skin integrity will decrease Outcome: Progressing

## 2022-04-12 NOTE — Progress Notes (Signed)
PROGRESS NOTE    Joseph Mcpherson   YTK:[id number] DOB: July 16, 1945  DOA: 04/10/2022 Date of Service: 04/12/22 PCP: [name], MD     Brief Narrative / Hospital Course:  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 Recently hospitalized w/ COVID pneumonia 03/22/22-03/26/22, and for NSTEMI 03/09/22-03/13/22.   HPI: Pt brought to ED from home via EMS for SOB. Felt okay yesterday and this morning was feeling very tired and easily worn out / short winded w/ minimal activity. Wife describes associated sweating but no chest pain or fever, no cough. SHe states he had one runny BM this morning, nonbloody, no persistent diarrhea. He continued to feel poorly thoguth home PT today. Family called EMS. Per EMS hypertensive w/ BP 212/110, SpO2 92% RA and dropped to 89% w/ minimal activity, hyperglycemic.   Hospital course:  11/17: in ED, VS hypoxic on RA down to 88-89%, not on home O2, improved on [place] 2L. Afebrile. WBC elevated to 17.0, but not meeting sepsis criteria. Hypertensive. CXR concerning for pulmonary edema vs pneumonia. BNP elevated to 4110. Cr c/w known ESRD. Hgb baseline at 10.1. Troponin c/w ESRD. Hyperglycemic. EDP started Tx for HCAP (given recent admission to hospital) w/ cefepime and vancomycin. Hospitalist consulted --> admitted to inpatient for acute hypoxic respiratory failure d/t HFrEF w/ volume overload versus/in addition to HCAP.  11/18: tropes up to 500+, cardiology consulted (Dr. Ronny Flurry) and started heparin gtt x48 hours. TSH low, added T3/T4. Procal and MRSA no concerns --> deescalate abx to d/c vanc. HD today. BP better today. Curbside w/ pulmonary (Dr. Lanney Gins) - agree w/ consider CT to reeval pleural effusions if no improvement after dialysis. In HD removed 1000 mL.  11/19: remains  on 2L O2 w/ SpO2 99-100% on this.   Consultants:  Nephrology   Procedures: none      ASSESSMENT & PLAN:   Active Problems:   Acute respiratory failure with hypoxia (HCC)   End stage renal disease (HCC)   Essential hypertension   Insulin dependent type 2 diabetes mellitus (HCC)   Hypothyroidism   Mixed hyperlipidemia   Dementia (HCC)   CAD (coronary artery disease)   Leukocytosis   Anemia due to chronic kidney disease   Acute HFrEF (heart failure with reduced ejection fraction) (HCC)   Acute respiratory failure with hypoxia (HCC) Acute HFrEF (heart failure with reduced ejection fraction) (HCC) Ddx: fluid overload in setting of HFrEF, possible HCAP Treating presumptive HCAP: but given improvement post-dialysis, pneumonia can be ruled OUT and abx d/c Treat fluid overload HFrEF given (+)rales and pulmonary edema on CXR, known pleural effusion Improvement post-dialysis Recent echocardiogram, will not repeat I&O Consider CT chest if clinically worsening - may need thoracentesis for pleural effusion Continue supplemental O2 as needed - on room air as of 04/12/22   Leukocytosis - resolved, likely reactive Loose stool in setting of recent hospitalization - resolved Await CDiff screen but this seems unlikely, no further BM and WBC improving  Consider CT chest/abd/pelvis if clinically worsening  BCx NG x2d  CAD (coronary artery disease) NSTEMI treatment but cardiology feels elevated troponins more likely d/t demand ischemia No chest pain  Continue BP control, ASA, Statin, Plavix  troponin increased overnight, repeat again this AM trending up --> consulted cardiology (Dr Humphrey Rolls) plan for heparin gtt 48h,  not candidate for intervention at this time  Cardiology following  End stage renal disease ([place]) HD per nephrology team   Essential hypertension Home meds Prn metoprolol   Insulin dependent type 2 diabetes mellitus (HCC) SSI and basal insulin A1C  Hypothyroidism w/  low TSH  holding home levothyroxine  No recent TSH and last 12/2021 was abnormal, will check TSH (given acute illness, would not change dose synthroid unless significantly abnormal) --> TSH 0.200 adding on T3 and Free T4   Mixed hyperlipidemia Continue statin   Dementia (HCC) Continue donepezil   Hx CVA Continue BP control, ASA, Statin, Plavix   Anemia due to chronic kidney disease Trend CBC   DVT prophylaxis: heparin gtt per cardiology  Pertinent IV fluids/nutrition: no continuous IV fluids Central lines / invasive devices: dialysis catheter R subclavian   Code Status: FULL CODE confirmed w/ patient and family in the ED on admission by Dr Sheppard Coil   Disposition: inpatient  TOC needs: pending clinical course but expect d/c home likely tomorrow              Subjective:  Patient reports no concerns, SOB has improved.  Denies CP  Pain controlled.  Denies new weakness.  Tolerating diet but low appetite which is chronic.  Reports no concerns w/ urination/defecation.   Family Communication: spoke w/ wife at bedside on rounds      Objective Findings:  Vitals:   04/12/22 0343 04/12/22 0453 04/12/22 0739 04/12/22 1212  BP: (!) 155/64  (!) 156/71 (!) 176/59  Pulse: 68  70 66  Resp: 18  16   Temp: 98.8 F (37.1 C)  98.1 F (36.7 C) 97.7 F (36.5 C)  TempSrc: Oral     SpO2: 100%  99% 100%  Weight:  64.4 kg    Height:        Intake/Output Summary (Last 24 hours) at 04/12/2022 1506 Last data filed at 04/12/2022 0600 Gross per 24 hour  Intake 871.66 ml  Output 1000 ml  Net -128.34 ml   Filed Weights   04/11/22 1300 04/11/22 1654 04/12/22 0453  Weight: 66.1 kg 64.8 kg 64.4 kg    Examination:  Constitutional:  VS as above General Appearance: alert, well-developed, well-nourished, NAD Eyes: Normal lids and conjunctive, non-icteric sclera Ears, Nose, Mouth, Throat: Normal external appearance MMM Respiratory: Normal respiratory effort No  wheeze No rhonchi + rales seem better today  Cardiovascular: S1/S2 normal No murmur RRR No rub/gallop auscultated No JVD No lower extremity edema Gastrointestinal: No tenderness Musculoskeletal:  No clubbing/cyanosis of digits Symmetrical movement in all extremities Neurological: No cranial nerve deficit on limited exam Alert Psychiatric: Normal judgment/insight Normal mood and affect       Scheduled Medications:   aspirin EC  81 mg Oral Daily   atorvastatin  40 mg Oral Daily   calcium acetate  1,334 mg Oral TID WC   Chlorhexidine Gluconate Cloth  6 each Topical Q0600   chlorproMAZINE  25 mg Oral TID   cholecalciferol  5,000 Units Oral Daily   clopidogrel  75 mg Oral Daily   donepezil  10 mg Oral Daily   gabapentin  300 mg Oral QHS   insulin aspart  0-5 Units Subcutaneous QHS   insulin aspart  0-9 Units Subcutaneous TID WC   insulin aspart  2 Units Subcutaneous TID WC   insulin glargine-yfgn  10 Units Subcutaneous QHS   isosorbide-hydrALAZINE  1 tablet Oral TID   losartan  100 mg Oral QPM   metoprolol  succinate  50 mg Oral Daily   multivitamin  1 tablet Oral Daily   pantoprazole  40 mg Oral Daily   sevelamer carbonate  2.4 g Oral Q breakfast    Continuous Infusions:  anticoagulant sodium citrate     heparin 750 Units/hr (04/11/22 1743)    PRN Medications:  acetaminophen **OR** acetaminophen, alteplase, anticoagulant sodium citrate, heparin, ipratropium-albuterol, lidocaine (PF), lidocaine-prilocaine, metoprolol tartrate, ondansetron **OR** ondansetron (ZOFRAN) IV, oxyCODONE, pentafluoroprop-tetrafluoroeth  Antimicrobials:  Anti-infectives (From admission, onward)    Start     Dose/Rate Route Frequency Ordered Stop   04/11/22 1800  ceFEPIme (MAXIPIME) 1 g in sodium chloride 0.9 % 100 mL IVPB  Status:  Discontinued        1 g 200 mL/hr over 30 Minutes Intravenous Every 24 hours 04/10/22 1829 04/12/22 1502   04/10/22 1829  vancomycin variable dose per  unstable renal function (pharmacist dosing)  Status:  Discontinued         Does not apply See admin instructions 04/10/22 1829 04/11/22 1352   04/10/22 1615  vancomycin (VANCOCIN) IVPB 1000 mg/200 mL premix  Status:  Discontinued        1,000 mg 200 mL/hr over 60 Minutes Intravenous  Once 04/10/22 1600 04/10/22 1614   04/10/22 1615  ceFEPIme (MAXIPIME) 2 g in sodium chloride 0.9 % 100 mL IVPB        2 g 200 mL/hr over 30 Minutes Intravenous  Once 04/10/22 1600 04/10/22 1711   04/10/22 1615  vancomycin (VANCOREADY) IVPB 1250 mg/250 mL        1,250 mg 166.7 mL/hr over 90 Minutes Intravenous  Once 04/10/22 1614 04/10/22 1852           Data Reviewed: I have personally reviewed following labs and imaging studies  CBC: Recent Labs  Lab 04/10/22 1525 04/10/22 1747 04/11/22 0608 04/12/22 0232  WBC 17.3* 13.5* 10.1 7.3  NEUTROABS 15.6*  --   --   --   HGB 10.1* 9.6* 9.1* 9.4*  HCT 33.3* 31.3* 29.5* 30.4*  MCV 85.2 85.5 84.8 85.2  PLT 184 181 157 751   Basic Metabolic Panel: Recent Labs  Lab 04/10/22 1525 04/10/22 1747 04/11/22 0608  NA 137  --  139  K 4.8  --  4.6  CL 101  --  103  CO2 26  --  27  GLUCOSE 332*  --  134*  BUN 23  --  31*  CREATININE 6.31* 6.33* 7.29*  CALCIUM 8.9  --  8.9   GFR: Estimated Creatinine Clearance: 7.2 mL/min (A) (by C-G formula based on SCr of 7.29 mg/dL (H)). Liver Function Tests: Recent Labs  Lab 04/11/22 0608  AST 16  ALT 10  ALKPHOS 110  BILITOT 1.2  PROT 6.0*  ALBUMIN 2.6*   No results for input(s): "LIPASE", "AMYLASE" in the last 168 hours. No results for input(s): "AMMONIA" in the last 168 hours. Coagulation Profile: Recent Labs  Lab 04/11/22 1144  INR 1.0   Cardiac Enzymes: No results for input(s): "CKTOTAL", "CKMB", "CKMBINDEX", "TROPONINI" in the last 168 hours. BNP (last 3 results) No results for input(s): "PROBNP" in the last 8760 hours. HbA1C: Recent Labs    04/11/22 0608  HGBA1C 5.9*   CBG: Recent Labs   Lab 04/11/22 2200 04/11/22 2230 04/11/22 2358 04/12/22 0740 04/12/22 1213  GLUCAP 66* 81 131* 136* 196*   Lipid Profile: No results for input(s): "CHOL", "HDL", "LDLCALC", "TRIG", "CHOLHDL", "LDLDIRECT" in the last 72 hours. Thyroid Function Tests: Recent  Labs    04/11/22 0603 04/11/22 0608  TSH  --  0.200*  FREET4 1.49*  --    Anemia Panel: No results for input(s): "VITAMINB12", "FOLATE", "FERRITIN", "TIBC", "IRON", "RETICCTPCT" in the last 72 hours. Most Recent Urinalysis On File:     Component Value Date/Time   COLORURINE Straw 09/11/2013 1933   APPEARANCEUR Clear 09/11/2013 1933   LABSPEC 1.016 09/11/2013 1933   PHURINE 7.0 09/11/2013 1933   GLUCOSEU >=500 09/11/2013 1933   HGBUR Negative 09/11/2013 1933   BILIRUBINUR Negative 09/11/2013 1933   KETONESUR Negative 09/11/2013 1933   PROTEINUR 100 mg/dL 09/11/2013 1933   NITRITE Negative 09/11/2013 1933   LEUKOCYTESUR Negative 09/11/2013 1933   Sepsis Labs: '@LABRCNTIP'$ (procalcitonin:4,lacticidven:4)  Recent Results (from the past 240 hour(s))  Resp Panel by RT-PCR (Flu A&B, Covid) Anterior Nasal Swab     Status: None   Collection Time: 04/10/22  3:25 PM   Specimen: Anterior Nasal Swab  Result Value Ref Range Status   SARS Coronavirus 2 by RT PCR NEGATIVE NEGATIVE Final    Comment: (NOTE) SARS-CoV-2 target nucleic acids are NOT DETECTED.  The SARS-CoV-2 RNA is generally detectable in upper respiratory specimens during the acute phase of infection. The lowest concentration of SARS-CoV-2 viral copies this assay can detect is 138 copies/mL. A negative result does not preclude SARS-Cov-2 infection and should not be used as the sole basis for treatment or other patient management decisions. A negative result may occur with  improper specimen collection/handling, submission of specimen other than nasopharyngeal swab, presence of viral mutation(s) within the areas targeted by this assay, and inadequate number of  viral copies(<138 copies/mL). A negative result must be combined with clinical observations, patient history, and epidemiological information. The expected result is Negative.  Fact Sheet for Patients:  EntrepreneurPulse.com.au  Fact Sheet for Healthcare Providers:  IncredibleEmployment.be  This test is no t yet approved or cleared by the [place] FDA and  has been authorized for detection and/or diagnosis of SARS-CoV-2 by FDA under an Emergency Use Authorization (EUA). This EUA will remain  in effect (meaning this test can be used) for the duration of the COVID-19 declaration under Section 564(b)(1) of the Act, 21 U.S.C.section 360bbb-3(b)(1), unless the authorization is terminated  or revoked sooner.       Influenza A by PCR NEGATIVE NEGATIVE Final   Influenza B by PCR NEGATIVE NEGATIVE Final    Comment: (NOTE) The Xpert Xpress SARS-CoV-2/FLU/RSV plus assay is intended as an aid in the diagnosis of influenza from Nasopharyngeal swab specimens and should not be used as a sole basis for treatment. Nasal washings and aspirates are unacceptable for Xpert Xpress SARS-CoV-2/FLU/RSV testing.  Fact Sheet for Patients: EntrepreneurPulse.com.au  Fact Sheet for Healthcare Providers: IncredibleEmployment.be  This test is not yet approved or cleared by the [place] FDA and has been authorized for detection and/or diagnosis of SARS-CoV-2 by FDA under an Emergency Use Authorization (EUA). This EUA will remain in effect (meaning this test can be used) for the duration of the COVID-19 declaration under Section 564(b)(1) of the Act, 21 U.S.C. section 360bbb-3(b)(1), unless the authorization is terminated or revoked.  Performed at [facility], [address] [place], [place] [address]   Blood culture (routine x 2)     Status: None (Preliminary result)   Collection Time: 04/10/22  4:37 PM    Specimen: BLOOD  Result Value Ref Range Status   Specimen Description BLOOD BLOOD LEFT ARM  Final   Special Requests  Final    BOTTLES DRAWN AEROBIC AND ANAEROBIC Blood Culture adequate volume   Culture   Final    NO GROWTH 2 DAYS Performed at [facility], [address] [place], [place] [address]    Report Status PENDING  Incomplete  Blood culture (routine x 2)     Status: None (Preliminary result)   Collection Time: 04/10/22  4:37 PM   Specimen: BLOOD  Result Value Ref Range Status   Specimen Description BLOOD BLOOD LEFT ARM  Final   Special Requests   Final    BOTTLES DRAWN AEROBIC AND ANAEROBIC Blood Culture adequate volume   Culture   Final    NO GROWTH 2 DAYS Performed at [facility], [address] [place], [place] [address]    Report Status PENDING  Incomplete  MRSA Next Gen by PCR, Nasal     Status: None   Collection Time: 04/10/22  6:53 PM   Specimen: Nasal Mucosa; Nasal Swab  Result Value Ref Range Status   MRSA by PCR Next Gen NOT DETECTED NOT DETECTED Final    Comment: (NOTE) The GeneXpert MRSA Assay (FDA approved for NASAL specimens only), is one component of a comprehensive MRSA colonization surveillance program. It is not intended to diagnose MRSA infection nor to guide or monitor treatment for MRSA infections. Test performance is not FDA approved in patients less than 69 years old. Performed at [facility], [place], [place] [address]   C Difficile Quick Screen w PCR reflex     Status: None   Collection Time: 04/12/22 10:00 AM   Specimen: STOOL  Result Value Ref Range Status   C Diff antigen NEGATIVE NEGATIVE Final   C Diff toxin NEGATIVE NEGATIVE Final   C Diff interpretation No C. difficile detected.  Final    Comment: Performed at [facility], [address] [place], [place] [address]         Radiology Studies: DG Chest Portable 1 View  Result Date: 04/10/2022 CLINICAL DATA:   Shortness of breath EXAM: PORTABLE CHEST 1 VIEW COMPARISON:  03/22/2022 FINDINGS: Right-sided tunneled central venous catheter with the tip in the right atrium. Small right pleural effusion. No pneumothorax. Unchanged cardiac and mediastinal contours. Compared to prior exam there are new linear opacities in the right lung base, which are nonspecific, and could represent atelectasis, but infection could have a similar appearance. Prominent interstitial opacities with mild peribronchial cuffing, could suggest a component of mild pulmonary edema. No displaced rib fractures. There is a left axillary vascular stent in place. IMPRESSION: 1. New patchy opacities in the right lung base are nonspecific, and could represent atelectasis, but infection could have a similar appearance. 2. Trace right pleural effusion. 3. Prominent interstitial opacities with mild peribronchial cuffing, could suggest a component of mild pulmonary edema. Electronically Signed   By: Marin Roberts M.D.   On: 04/10/2022 15:36            LOS: 2 days       Emeterio Reeve, DO [facility] 04/12/2022, 3:06 PM    Dictation software may have been used to generate the above note. Typos may occur and escape review in typed/dictated notes. Please contact Dr Sheppard Coil directly for clarity if needed.  Staff may message me via secure chat in [place]  but this may not receive an immediate response,  please page me for urgent matters!  If 7PM-7AM, please contact night coverage [email or web address]

## 2022-04-12 NOTE — Progress Notes (Signed)
[facility]  ROUNDING NOTE   Subjective:   Patient is a 76 year old man of Asian/Indian origin with a past medical history of ESRD, on hemodialysis on Tuesday Thursday Saturday schedule, diabetes mellitus type 2, CVA, hypothyroidism who came to the ER with chief complaint of shortness of breath. He has been admitted for Shortness of breath [R06.02] Hypoxia [R09.02] HCAP (healthcare-associated pneumonia) [J18.9] Hypoxic respiratory failure ([place]) [J96.91]  Patient is known to our practice from previous admissions and receives outpatient dialysis treatments at [facility] on a TTS schedule, supervised by [facility] nephrologist.   Patient seen sitting in chair, wife at bedside States his shortness of breath has improved Denis cough No lower extremity edema  Objective:  Vital signs in last 24 hours:  Temp:  [97.8 F (36.6 C)-99.2 F (37.3 C)] 98.1 F (36.7 C) (11/19 0739) Pulse Rate:  [49-70] 70 (11/19 0739) Resp:  [11-23] 16 (11/19 0739) BP: (90-156)/(29-86) 156/71 (11/19 0739) SpO2:  [99 %-100 %] 99 % (11/19 0739) Weight:  [64.4 kg-66.1 kg] 64.4 kg (11/19 0453)  Weight change: 0.51 kg Filed Weights   04/11/22 1300 04/11/22 1654 04/12/22 0453  Weight: 66.1 kg 64.8 kg 64.4 kg    Intake/Output: I/O last 3 completed shifts: In: 871.7 [P.O.:720; I.V.:51.7; IV Piggyback:100] Out: 1000 [Other:1000]   Intake/Output this shift:  No intake/output data recorded.  Physical Exam: General: NAD  Head: Normocephalic, atraumatic. Moist oral mucosal membranes  Eyes: Anicteric  Lungs:  Basilar wheeze, normal effort, room air  Heart: Regular rate and rhythm  Abdomen:  Soft, non-tender, nondistended  Extremities: No peripheral edema.  Neurologic: Nonfocal, moving all four extremities  Skin: No lesions  Access: Right chest PermCath    Basic Metabolic Panel: Recent Labs  Lab 04/10/22 1525 04/10/22 1747 04/11/22 0608  NA 137  --  139  K 4.8  --  4.6  CL  101  --  103  CO2 26  --  27  GLUCOSE 332*  --  134*  BUN 23  --  31*  CREATININE 6.31* 6.33* 7.29*  CALCIUM 8.9  --  8.9     Liver Function Tests: Recent Labs  Lab 04/11/22 0608  AST 16  ALT 10  ALKPHOS 110  BILITOT 1.2  PROT 6.0*  ALBUMIN 2.6*    No results for input(s): "LIPASE", "AMYLASE" in the last 168 hours. No results for input(s): "AMMONIA" in the last 168 hours.  CBC: Recent Labs  Lab 04/10/22 1525 04/10/22 1747 04/11/22 0608 04/12/22 0232  WBC 17.3* 13.5* 10.1 7.3  NEUTROABS 15.6*  --   --   --   HGB 10.1* 9.6* 9.1* 9.4*  HCT 33.3* 31.3* 29.5* 30.4*  MCV 85.2 85.5 84.8 85.2  PLT 184 181 157 170     Cardiac Enzymes: No results for input(s): "CKTOTAL", "CKMB", "CKMBINDEX", "TROPONINI" in the last 168 hours.  BNP: Invalid input(s): "POCBNP"  CBG: Recent Labs  Lab 04/11/22 2129 04/11/22 2200 04/11/22 2230 04/11/22 2358 04/12/22 0740  GLUCAP 53* 66* 81 131* 136*     Microbiology: Results for orders placed or performed during the hospital encounter of 04/10/22  Resp Panel by RT-PCR (Flu A&B, Covid) Anterior Nasal Swab     Status: None   Collection Time: 04/10/22  3:25 PM   Specimen: Anterior Nasal Swab  Result Value Ref Range Status   SARS Coronavirus 2 by RT PCR NEGATIVE NEGATIVE Final    Comment: (NOTE) SARS-CoV-2 target nucleic acids are NOT DETECTED.  The SARS-CoV-2  RNA is generally detectable in upper respiratory specimens during the acute phase of infection. The lowest concentration of SARS-CoV-2 viral copies this assay can detect is 138 copies/mL. A negative result does not preclude SARS-Cov-2 infection and should not be used as the sole basis for treatment or other patient management decisions. A negative result may occur with  improper specimen collection/handling, submission of specimen other than nasopharyngeal swab, presence of viral mutation(s) within the areas targeted by this assay, and inadequate number of  viral copies(<138 copies/mL). A negative result must be combined with clinical observations, patient history, and epidemiological information. The expected result is Negative.  Fact Sheet for Patients:  EntrepreneurPulse.com.au  Fact Sheet for Healthcare Providers:  IncredibleEmployment.be  This test is no t yet approved or cleared by the [place] FDA and  has been authorized for detection and/or diagnosis of SARS-CoV-2 by FDA under an Emergency Use Authorization (EUA). This EUA will remain  in effect (meaning this test can be used) for the duration of the COVID-19 declaration under Section 564(b)(1) of the Act, 21 U.S.C.section 360bbb-3(b)(1), unless the authorization is terminated  or revoked sooner.       Influenza A by PCR NEGATIVE NEGATIVE Final   Influenza B by PCR NEGATIVE NEGATIVE Final    Comment: (NOTE) The Xpert Xpress SARS-CoV-2/FLU/RSV plus assay is intended as an aid in the diagnosis of influenza from Nasopharyngeal swab specimens and should not be used as a sole basis for treatment. Nasal washings and aspirates are unacceptable for Xpert Xpress SARS-CoV-2/FLU/RSV testing.  Fact Sheet for Patients: EntrepreneurPulse.com.au  Fact Sheet for Healthcare Providers: IncredibleEmployment.be  This test is not yet approved or cleared by the [place] FDA and has been authorized for detection and/or diagnosis of SARS-CoV-2 by FDA under an Emergency Use Authorization (EUA). This EUA will remain in effect (meaning this test can be used) for the duration of the COVID-19 declaration under Section 564(b)(1) of the Act, 21 U.S.C. section 360bbb-3(b)(1), unless the authorization is terminated or revoked.  Performed at [facility], [address] [place], [place] [address]   Blood culture (routine x 2)     Status: None (Preliminary result)   Collection Time: 04/10/22  4:37 PM    Specimen: BLOOD  Result Value Ref Range Status   Specimen Description BLOOD BLOOD LEFT ARM  Final   Special Requests   Final    BOTTLES DRAWN AEROBIC AND ANAEROBIC Blood Culture adequate volume   Culture   Final    NO GROWTH 2 DAYS Performed at [facility], [address], [place], [place] [address]    Report Status PENDING  Incomplete  Blood culture (routine x 2)     Status: None (Preliminary result)   Collection Time: 04/10/22  4:37 PM   Specimen: BLOOD  Result Value Ref Range Status   Specimen Description BLOOD BLOOD LEFT ARM  Final   Special Requests   Final    BOTTLES DRAWN AEROBIC AND ANAEROBIC Blood Culture adequate volume   Culture   Final    NO GROWTH 2 DAYS Performed at [facility], [address], [place],  [address]    Report Status PENDING  Incomplete  MRSA Next Gen by PCR, Nasal     Status: None   Collection Time: 04/10/22  6:53 PM   Specimen: Nasal Mucosa; Nasal Swab  Result Value Ref Range Status   MRSA by PCR Next Gen NOT DETECTED NOT DETECTED Final    Comment: (NOTE) The GeneXpert  MRSA Assay (FDA approved for NASAL specimens only), is one component of a comprehensive MRSA colonization surveillance program. It is not intended to diagnose MRSA infection nor to guide or monitor treatment for MRSA infections. Test performance is not FDA approved in patients less than 76 years old. Performed at [facility], [address] [place], [place] [address]     Coagulation Studies: Recent Labs    04/11/22 1144  LABPROT 13.2  INR 1.0      Urinalysis: No results for input(s): "COLORURINE", "LABSPEC", "PHURINE", "GLUCOSEU", "HGBUR", "BILIRUBINUR", "KETONESUR", "PROTEINUR", "UROBILINOGEN", "NITRITE", "LEUKOCYTESUR" in the last 72 hours.  Invalid input(s): "APPERANCEUR"    Imaging: DG Chest Portable 1 View  Result Date: 04/10/2022 CLINICAL DATA:  Shortness of breath EXAM: PORTABLE CHEST 1 VIEW COMPARISON:  03/22/2022  FINDINGS: Right-sided tunneled central venous catheter with the tip in the right atrium. Small right pleural effusion. No pneumothorax. Unchanged cardiac and mediastinal contours. Compared to prior exam there are new linear opacities in the right lung base, which are nonspecific, and could represent atelectasis, but infection could have a similar appearance. Prominent interstitial opacities with mild peribronchial cuffing, could suggest a component of mild pulmonary edema. No displaced rib fractures. There is a left axillary vascular stent in place. IMPRESSION: 1. New patchy opacities in the right lung base are nonspecific, and could represent atelectasis, but infection could have a similar appearance. 2. Trace right pleural effusion. 3. Prominent interstitial opacities with mild peribronchial cuffing, could suggest a component of mild pulmonary edema. Electronically Signed   By: Marin Roberts M.D.   On: 04/10/2022 15:36     Medications:    anticoagulant sodium citrate     ceFEPime (MAXIPIME) IV 1 g (04/11/22 1937)   heparin 750 Units/hr (04/11/22 1743)    aspirin EC  81 mg Oral Daily   atorvastatin  40 mg Oral Daily   calcium acetate  1,334 mg Oral TID WC   Chlorhexidine Gluconate Cloth  6 each Topical Q0600   chlorproMAZINE  25 mg Oral TID   cholecalciferol  5,000 Units Oral Daily   clopidogrel  75 mg Oral Daily   donepezil  10 mg Oral Daily   gabapentin  300 mg Oral QHS   insulin aspart  0-5 Units Subcutaneous QHS   insulin aspart  0-9 Units Subcutaneous TID WC   insulin aspart  2 Units Subcutaneous TID WC   insulin glargine-yfgn  10 Units Subcutaneous QHS   isosorbide-hydrALAZINE  1 tablet Oral TID   losartan  100 mg Oral QPM   metoprolol succinate  50 mg Oral Daily   multivitamin  1 tablet Oral Daily   pantoprazole  40 mg Oral Daily   sevelamer carbonate  2.4 g Oral Q breakfast   acetaminophen **OR** acetaminophen, alteplase, anticoagulant sodium citrate, heparin,  ipratropium-albuterol, lidocaine (PF), lidocaine-prilocaine, metoprolol tartrate, ondansetron **OR** ondansetron (ZOFRAN) IV, oxyCODONE, pentafluoroprop-tetrafluoroeth  Chest x-ray done on March 09, 2022 IMPRESSION: 1. Cardiomegaly with vascular congestion and pulmonary edema 2. Worsening airspace disease at the right greater than left lung base, atelectasis versus pneumonia       Assessment/ Plan:  Joseph Mcpherson is a 76 y.o.  male with a past medical history of hypertension, end-stage renal disease, on hemodialysis on Monday Wednesday Friday schedule, diabetes mellitus, CVA who was brought to the ER with chief complaint of weakness.  [facility]/TTS/right chest PermCath  End-stage renal disease on hemodialysis.         Patient is on Tuesday  Thursday Saturday schedule as an outpatient Dialysis received yesterday, UF 1 L achieved.  Next treatment scheduled for Tuesday.    2. Anemia of chronic kidney disease  Lab Results  Component Value Date   HGB 9.4 (L) 04/12/2022    Patient receives [place] outpatient.  We will continue to monitor hemoglobin during this admission  3. Secondary Hyperparathyroidism: with outpatient labs: PTH 422, phosphorus 6.4, calcium 9.1 on 12/29/2021.   Lab Results  Component Value Date   CALCIUM 8.9 04/11/2022   CAION 0.83 (LL) 12/10/2020   PHOS 4.1 03/12/2022    We will continue to monitor bone minerals during this admission.  Bone minerals acceptable.  Continue calcium acetate with meals.  4.  Hypertension with chronic kidney disease.  Home regimen includes hydralazine losartan, and metoprolol.   Blood pressure 156/71.  5. Diabetes mellitus type II with chronic kidney disease insulin dependent. Home regimen includes NovoLog and Levemir. Most recent hemoglobin A1c is 8.1 on 01/18/2022.  Currently prescribed Januvia outpatient.  Currently held.  Glucose well controlled.  Sliding scale insulin managed by primary team.    LOS: 2    11/19/202311:17 AM

## 2022-04-12 NOTE — Consult Note (Signed)
ANTICOAGULATION CONSULT NOTE  Pharmacy Consult for Heparin Infusion Indication: chest pain/ACS  Patient Measurements: Height: '5\' 4"'$  (162.6 cm) Weight: 64.4 kg (141 lb 15.6 oz) IBW/kg (Calculated) : 59.2 Heparin Dosing Weight: 65.6  Labs: Recent Labs    04/10/22 1525 04/10/22 1718 04/10/22 1747 04/10/22 2043 04/11/22 0603 04/11/22 0608 04/11/22 1144 04/12/22 0232 04/12/22 1055  HGB 10.1*  --  9.6*  --   --  9.1*  --  9.4*  --   HCT 33.3*  --  31.3*  --   --  29.5*  --  30.4*  --   PLT 184  --  181  --   --  157  --  170  --   APTT  --   --   --   --   --   --  37*  --   --   LABPROT  --   --   --   --   --   --  13.2  --   --   INR  --   --   --   --   --   --  1.0  --   --   HEPARINUNFRC  --   --   --   --   --   --   --  0.60 0.34  CREATININE 6.31*  --  6.33*  --   --  7.29*  --   --   --   TROPONINIHS 37* 98*  --  310* 529*  --   --   --   --      Estimated Creatinine Clearance: 7.2 mL/min (A) (by C-G formula based on SCr of 7.29 mg/dL (H)).  Medical History: Past Medical History:  Diagnosis Date   Clotting disorder ([place])    per daughter, "when patient travels, his feet swell"   Diabetes mellitus without complication ([place])    type II   Diabetic eye exam ([place]) 12/2019   ESRD (end stage renal disease) ([place])    MWF- [place]   GERD (gastroesophageal reflux disease)    [name] (hard of hearing)    Hyperlipidemia    Hypertension    Hypothyroidism    Thyroid disease     Medications:  No chronic anticoagulation PTA noted from chart review ASA / Plavix PTA  Assessment: 76 yo M presents with SOB, fatigue and weakness. Troponins trending up, most recently 529. ECG shows lateral ischemia and patient is not a candidate for PCI given condition and renal failure. Heparin to run for 48 hrs.  Baseline Labs: aPTT - 37; INR - 1.0 Hgb - 9.6; Plts - 181  Goal of Therapy:  Heparin level 0.3-0.7 units/ml Monitor platelets by anticoagulation protocol: Yes   1119 0232 HL  0.6, therapeutic x 1 1119 1055 HL 0.34, therapeutic x 2  Plan: Continue heparin infusion at 750 units/hr Recheck HL tomorrow AM CBC daily while on heparin  Benita Gutter  04/12/2022 11:34 AM

## 2022-04-12 NOTE — Progress Notes (Signed)
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 Patient presented to ED on 04/10/22 with complaints of shortness of breath.    Vitals:   04/12/22 0343 04/12/22 0453 04/12/22 0739 04/12/22 1212  BP: (!) 155/64  (!) 156/71 (!) 176/59  Pulse: 68  70 66  Resp: 18  16   Temp: 98.8 F (37.1 C)  98.1 F (36.7 C) 97.7 F (36.5 C)  TempSrc: Oral     SpO2: 100%  99% 100%  Weight:  64.4 kg    Height:        Intake/Output Summary (Last 24 hours) at 04/12/2022 1310 Last data filed at 04/12/2022 0600 Gross per 24 hour  Intake 871.66 ml  Output 1000 ml  Net -128.34 ml    LABS: Basic Metabolic Panel: Recent Labs    04/10/22 1525 04/10/22 1747 04/11/22 0608  NA 137  --  139  K 4.8  --  4.6  CL 101  --  103  CO2 26  --  27  GLUCOSE 332*  --  134*  BUN 23  --  31*  CREATININE 6.31* 6.33* 7.29*  CALCIUM 8.9  --  8.9   Liver Function Tests: Recent Labs    04/11/22 0608  AST 16  ALT 10  ALKPHOS 110  BILITOT 1.2  PROT 6.0*  ALBUMIN 2.6*   No results for input(s): "LIPASE", "AMYLASE" in the last 72 hours. CBC: Recent Labs    04/10/22 1525 04/10/22 1747 04/11/22 0608 04/12/22 0232  WBC 17.3*   < > 10.1 7.3  NEUTROABS 15.6*  --   --   --   HGB 10.1*   < > 9.1* 9.4*  HCT 33.3*   < > 29.5* 30.4*  MCV 85.2   < > 84.8 85.2  PLT 184   < > 157 170   < > = values in this interval not displayed.   Cardiac Enzymes: No results for input(s): "CKTOTAL", "CKMB", "CKMBINDEX", "TROPONINI" in the last 72 hours. BNP: Invalid input(s): "POCBNP" D-Dimer: No results for input(s): "DDIMER" in the last 72 hours. Hemoglobin A1C: Recent Labs    04/11/22 0608  HGBA1C 5.9*   Fasting Lipid Panel: No results for input(s): "CHOL", "HDL", "LDLCALC",  "TRIG", "CHOLHDL", "LDLDIRECT" in the last 72 hours. Thyroid Function Tests: Recent Labs    04/11/22 0608  TSH 0.200*   Anemia Panel: No results for input(s): "VITAMINB12", "FOLATE", "FERRITIN", "TIBC", "IRON", "RETICCTPCT" in the last 72 hours.   PHYSICAL EXAM General: Well developed, well nourished, in no acute distress HEENT:  Normocephalic and atramatic Neck:  No JVD.  Lungs: Clear bilaterally to auscultation and percussion. Heart: HRRR . Normal S1 and S2 without gallops or murmurs.  Abdomen: Bowel sounds are positive, abdomen soft and non-tender  Msk:  Back normal, normal gait. Normal strength and tone for age. Extremities: No clubbing, cyanosis or edema.   Neuro: Alert and oriented X 3. Psych:  Good affect, responds appropriately  TELEMETRY: sinus rhythm  ASSESSMENT AND PLAN: Patient currently resting in bed. Denies chest pain, shortness of breath. Elevated troponin levels likely due to demand ischemia. Will continue to follow.   Principal Problem:   Hypoxic respiratory failure (HCC) Active Problems:   Essential hypertension   Mixed hyperlipidemia   Insulin dependent  type 2 diabetes mellitus (HCC)   Hypothyroidism   End stage renal disease ([place])   Acute respiratory failure with hypoxia (HCC)   Dementia (HCC)   CAD (coronary artery disease)   Leukocytosis   Anemia due to chronic kidney disease   Acute HFrEF (heart failure with reduced ejection fraction) ([place])    Alek Poncedeleon, FNP-C 04/12/2022 1:10 PM

## 2022-04-12 NOTE — Consult Note (Signed)
ANTICOAGULATION CONSULT NOTE Pharmacy Consult for heparin Indication: chest pain/ACS  No Known Allergies  Patient Measurements: Height: '5\' 4"'$  (162.6 cm) Weight: 64.8 kg (142 lb 13.7 oz) IBW/kg (Calculated) : 59.2 Heparin Dosing Weight: 65.6  Vital Signs: Temp: 97.8 F (36.6 C) (11/18 2031) Temp Source: Oral (11/18 2031) BP: 136/62 (11/18 2357) Pulse Rate: 60 (11/18 2357)  Labs: Recent Labs    04/10/22 1525 04/10/22 1718 04/10/22 1747 04/10/22 2043 04/11/22 0603 04/11/22 0608 04/11/22 1144 04/12/22 0232  HGB 10.1*  --  9.6*  --   --  9.1*  --  9.4*  HCT 33.3*  --  31.3*  --   --  29.5*  --  30.4*  PLT 184  --  181  --   --  157  --  170  APTT  --   --   --   --   --   --  37*  --   LABPROT  --   --   --   --   --   --  13.2  --   INR  --   --   --   --   --   --  1.0  --   HEPARINUNFRC  --   --   --   --   --   --   --  0.60  CREATININE 6.31*  --  6.33*  --   --  7.29*  --   --   TROPONINIHS 37* 98*  --  310* 529*  --   --   --      Estimated Creatinine Clearance: 7.2 mL/min (A) (by C-G formula based on SCr of 7.29 mg/dL (H)).  Medical History: Past Medical History:  Diagnosis Date   Clotting disorder ([place])    per daughter, "when patient travels, his feet swell"   Diabetes mellitus without complication ([place])    type II   Diabetic eye exam ([place]) 12/2019   ESRD (end stage renal disease) ([place])    MWF- [place]   GERD (gastroesophageal reflux disease)    [name] (hard of hearing)    Hyperlipidemia    Hypertension    Hypothyroidism    Thyroid disease     Medications:  No chronic anticoagulation PTA noted from chart review Clopidogrel 75 mg daily bASA PTA  Assessment: 76 yo M presents with SOB, fatigue and weakness. Troponins trending up, most recently 529. ECG shows lateral ischemia and patient is not a candidate for PCI given condition and renal failure. Heparin to run for 48 hrs.  Baseline Labs: aPTT - 37; INR - 1.0 Hgb - 9.6; Plts - 181  Goal of  Therapy:  Heparin level 0.3-0.7 units/ml Monitor platelets by anticoagulation protocol: Yes   11/19 0232 HL 0.6, therapeutic x 1  Plan: Continue heparin infusion at 750 units/hr Recheck HL in 8 hrs to confirm CBC daily while on heparin  Renda Rolls, PharmD, [facility] 04/12/2022 3:27 AM

## 2022-04-13 ENCOUNTER — Inpatient Hospital Stay (HOSPITAL_COMMUNITY): Admit: 2022-04-13 | Payer: Medicare HMO | Admitting: Family Medicine

## 2022-04-13 ENCOUNTER — Inpatient Hospital Stay: Payer: Medicare HMO

## 2022-04-13 ENCOUNTER — Encounter (HOSPITAL_COMMUNITY): Payer: Self-pay

## 2022-04-13 DIAGNOSIS — G253 Myoclonus: Secondary | ICD-10-CM | POA: Diagnosis not present

## 2022-04-13 DIAGNOSIS — R569 Unspecified convulsions: Secondary | ICD-10-CM | POA: Diagnosis not present

## 2022-04-13 DIAGNOSIS — R9401 Abnormal electroencephalogram [EEG]: Secondary | ICD-10-CM | POA: Diagnosis not present

## 2022-04-13 DIAGNOSIS — J9601 Acute respiratory failure with hypoxia: Secondary | ICD-10-CM | POA: Diagnosis not present

## 2022-04-13 DIAGNOSIS — I2510 Atherosclerotic heart disease of native coronary artery without angina pectoris: Secondary | ICD-10-CM | POA: Diagnosis not present

## 2022-04-13 DIAGNOSIS — R4182 Altered mental status, unspecified: Secondary | ICD-10-CM

## 2022-04-13 DIAGNOSIS — E039 Hypothyroidism, unspecified: Secondary | ICD-10-CM | POA: Diagnosis not present

## 2022-04-13 DIAGNOSIS — N186 End stage renal disease: Secondary | ICD-10-CM | POA: Diagnosis not present

## 2022-04-13 LAB — BASIC METABOLIC PANEL
Anion gap: 6 (ref 5–15)
Anion gap: 11 (ref 5–15)
BUN: 40 mg/dL — ABNORMAL HIGH (ref 8–23)
BUN: 44 mg/dL — ABNORMAL HIGH (ref 8–23)
CO2: 27 mmol/L (ref 22–32)
CO2: 23 mmol/L (ref 22–32)
Calcium: 9.2 mg/dL (ref 8.9–10.3)
Calcium: 9.1 mg/dL (ref 8.9–10.3)
Chloride: 104 mmol/L (ref 98–111)
Chloride: 102 mmol/L (ref 98–111)
Creatinine, Ser: 8.68 mg/dL — ABNORMAL HIGH (ref 0.61–1.24)
Creatinine, Ser: 9.27 mg/dL — ABNORMAL HIGH (ref 0.61–1.24)
GFR, Estimated: 6 mL/min — ABNORMAL LOW (ref >60)
GFR, Estimated: 5 mL/min — ABNORMAL LOW (ref >60)
Glucose, Bld: 120 mg/dL — ABNORMAL HIGH (ref 70–99)
Glucose, Bld: 113 mg/dL — ABNORMAL HIGH (ref 70–99)
Potassium: 5.6 mmol/L — ABNORMAL HIGH (ref 3.5–5.1)
Potassium: 5.6 mmol/L — ABNORMAL HIGH (ref 3.5–5.1)
Sodium: 137 mmol/L (ref 135–145)
Sodium: 136 mmol/L (ref 135–145)

## 2022-04-13 LAB — RESPIRATORY PANEL BY PCR

## 2022-04-13 LAB — GLUCOSE, CAPILLARY
Glucose-Capillary: 112 mg/dL — ABNORMAL HIGH (ref 70–99)
Glucose-Capillary: 111 mg/dL — ABNORMAL HIGH (ref 70–99)
Glucose-Capillary: 122 mg/dL — ABNORMAL HIGH (ref 70–99)
Glucose-Capillary: 126 mg/dL — ABNORMAL HIGH (ref 70–99)
Glucose-Capillary: 119 mg/dL — ABNORMAL HIGH (ref 70–99)

## 2022-04-13 LAB — BLOOD GAS, VENOUS
Acid-Base Excess: 4.1 mmol/L — ABNORMAL HIGH (ref 0.0–2.0)
Bicarbonate: 28.5 mmol/L — ABNORMAL HIGH (ref 20.0–28.0)
O2 Saturation: 92.2 %
Patient temperature: 37
pCO2, Ven: 41 mmHg — ABNORMAL LOW (ref 44–60)
pH, Ven: 7.45 — ABNORMAL HIGH (ref 7.25–7.43)
pO2, Ven: 58 mmHg — ABNORMAL HIGH (ref 32–45)

## 2022-04-13 LAB — HEPATIC FUNCTION PANEL
ALT: 8 U/L (ref 0–44)
AST: 14 U/L — ABNORMAL LOW (ref 15–41)
Albumin: 2.4 g/dL — ABNORMAL LOW (ref 3.5–5.0)
Alkaline Phosphatase: 103 U/L (ref 38–126)
Bilirubin, Direct: <0.1 mg/dL (ref 0.0–0.2)
Total Bilirubin: 0.8 mg/dL (ref 0.3–1.2)
Total Protein: 5.8 g/dL — ABNORMAL LOW (ref 6.5–8.1)

## 2022-04-13 LAB — CBC
HCT: 28.3 % — ABNORMAL LOW (ref 39.0–52.0)
Hemoglobin: 8.5 g/dL — ABNORMAL LOW (ref 13.0–17.0)
MCH: 25.7 pg — ABNORMAL LOW (ref 26.0–34.0)
MCHC: 30 g/dL (ref 30.0–36.0)
MCV: 85.5 fL (ref 80.0–100.0)
Platelets: 193 10*3/uL (ref 150–400)
RBC: 3.31 MIL/uL — ABNORMAL LOW (ref 4.22–5.81)
RDW: 16.9 % — ABNORMAL HIGH (ref 11.5–15.5)
WBC: 9.6 10*3/uL (ref 4.0–10.5)
nRBC: 0 % (ref 0.0–0.2)

## 2022-04-13 LAB — AMMONIA: Ammonia: 29 umol/L (ref 9–35)

## 2022-04-13 LAB — MAGNESIUM: Magnesium: 2.1 mg/dL (ref 1.7–2.4)

## 2022-04-13 LAB — HEPARIN LEVEL (UNFRACTIONATED): Heparin Unfractionated: 0.18 IU/mL — ABNORMAL LOW (ref 0.30–0.70)

## 2022-04-13 MED ORDER — LEVETIRACETAM IN NACL 1500 MG/100ML IV SOLN
1500.0000 mg | Freq: Once | INTRAVENOUS | Status: AC
  Administered 2022-04-13: 1500 mg via INTRAVENOUS
  Filled 2022-04-13: qty 100

## 2022-04-13 MED ORDER — LORAZEPAM 2 MG/ML IJ SOLN
1.0000 mg | Freq: Four times a day (QID) | INTRAMUSCULAR | Status: DC | PRN
  Administered 2022-04-14: 1 mg via INTRAVENOUS
  Filled 2022-04-13: qty 1

## 2022-04-13 MED ORDER — LEVETIRACETAM IN NACL 500 MG/100ML IV SOLN
500.0000 mg | INTRAVENOUS | Status: DC
  Administered 2022-04-14 – 2022-04-16 (×3): 500 mg via INTRAVENOUS
  Filled 2022-04-13 (×3): qty 100

## 2022-04-13 MED ORDER — INSULIN ASPART 100 UNIT/ML IV SOLN
5.0000 [IU] | Freq: Once | INTRAVENOUS | Status: AC
  Administered 2022-04-13: 5 [IU] via INTRAVENOUS
  Filled 2022-04-13: qty 0.05

## 2022-04-13 MED ORDER — HEPARIN BOLUS VIA INFUSION
2000.0000 [IU] | Freq: Once | INTRAVENOUS | Status: AC
  Administered 2022-04-13: 2000 [IU] via INTRAVENOUS
  Filled 2022-04-13: qty 2000

## 2022-04-13 MED ORDER — DEXTROSE 50 % IV SOLN
1.0000 | Freq: Once | INTRAVENOUS | Status: AC
  Administered 2022-04-13: 50 mL via INTRAVENOUS
  Filled 2022-04-13: qty 50

## 2022-04-13 MED ORDER — SODIUM CHLORIDE 0.9 % IV SOLN
250.0000 mg | INTRAVENOUS | Status: DC
  Administered 2022-04-14: 250 mg via INTRAVENOUS
  Filled 2022-04-13 (×2): qty 2.5

## 2022-04-13 MED ORDER — PATIROMER SORBITEX CALCIUM 8.4 G PO PACK
16.8000 g | PACK | Freq: Every day | ORAL | Status: DC
  Administered 2022-04-13 – 2022-04-14 (×2): 16.8 g via ORAL
  Filled 2022-04-13 (×3): qty 2

## 2022-04-13 MED ORDER — LORAZEPAM 2 MG/ML IJ SOLN
1.0000 mg | Freq: Once | INTRAMUSCULAR | Status: AC
  Administered 2022-04-13: 1 mg via INTRAVENOUS
  Filled 2022-04-13: qty 1

## 2022-04-13 NOTE — Progress Notes (Signed)
PT Cancellation Note  Patient Details Name: Joseph Mcpherson MRN: [id number] DOB: Aug 31, 1945   Cancelled Treatment:    Reason Eval/Treat Not Completed: Medical issues which prohibited therapy;Patient not medically ready. Evaluation attempted twice: pt somnolent earlier, and now waiting to go OTF for stat head CT. Will attempt evaluation again at later date/time.   1:38 PM, 04/13/22 Etta Grandchild, PT, DPT Physical Therapist - [facility]  [phone] ([place])     [place] C 04/13/2022, 1:38 PM

## 2022-04-13 NOTE — Procedures (Signed)
Routine EEG Report  Joseph Mcpherson is a 76 y.o. male with a history of myoclonus and altered mental status who is undergoing an EEG to evaluate for seizures.  Report: This EEG was acquired with electrodes placed according to the International 10-20 electrode system (including Fp1, Fp2, F3, F4, C3, C4, P3, P4, O1, O2, T3, T4, T5, T6, A1, A2, Fz, Cz, Pz). The following electrodes were missing or displaced: none.  The best background was 6-7 Hz with intermittent focal slowing on the left. This activity is reactive to stimulation. Drowsiness was manifested by background fragmentation; deeper stages of sleep were not identified. There were frequent sharp waves, both generalized and focal over the left parietal region. The majority of these do not have a triphasic morphology. At times the focal sharp waves become briefly periodic up to 2 Hz. There are also runs of rhythmic delta best formed over the left hemisphere that last <10 seconds and rarely show mild evolution. Per technician notes he at times stops following commands during the study. By video his focal myoclonus events are not necessarily correlated to EEG change and frequently no observable myoclonus is seen during the EEG abnormalities. Aside from the electrographic abnormalities already described, there were no other epileptiform abnormalities or definitive electrographic seizures. Photic stimulation and hyperventilation were not performed.  Impression and clinical correlation: This EEG was obtained while awake and drowsy and is abnormal due to:  - Mild diffuse slowing indicative of global cerebral dysfunction - Intermittent focal slowing, at times rhythmic, over the left posterior region indicative of focal cerebral dysfunction in that area - Frequent sharp waves (focal L parietal, less often generalized) indicating increased epileptogenicity. The majority of these do not display triphasic morphology (ie not favored to be secondary to metabolic  derangement). 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 Discussed with Dr. Sheppard Coil and Dr. Lorrin Goodell.  Su Monks, MD [facility] [phone]  If 7pm- 7am, please page neurology on call as listed in [place].

## 2022-04-13 NOTE — Progress Notes (Signed)
Plan of Care Note for accepted transfer   Patient: Joseph Mcpherson MRN: [id number]   [place]: 04/10/2022  Facility requesting transfer: [facility]  Requesting Provider: Dr. Sheppard Coil   Reason for transfer: Continuous EEG   Facility course: 76 yr old male with HTN, IDDM, dementia, ESRD, CAD, HFmrEF, and hx of CVA who was admitted to [facility] on 11/17 with acute respiratory failure d/t hypervolemia, and while respiratory status improved with dialysis, he developed myoclonus last night. He was seen by neurology today. He had abnormal EEG, was started on Keppra, and neurology recommended transfer to [facility] for continuous EEG.   Plan of care: Continuous EEG.  The patient is accepted for admission to Progressive unit, at [facility].   Author: Vianne Bulls, MD 04/13/2022  Check [email or web address] for on-call coverage.  Nursing staff, Please call [place] number on Amion as soon as patient's arrival, so appropriate admitting provider can evaluate the pt.

## 2022-04-13 NOTE — Discharge Summary (Signed)
Physician Discharge Summary   Patient: Joseph Mcpherson MRN: [id number]  DOB: 1945/10/01   Admit:     Date of Admission: 04/10/2022 Admitted from: home   Discharge: Date of discharge: 04/13/22 Disposition:  [facility]  Condition at discharge: stable  CODE STATUS: [place]      Discharge Physician: Emeterio Reeve, DO [facility]     PCP: [name], MD      Discharge Diagnoses: Principal Problem:   Seizure [facility]) Active Problems:   Acute respiratory failure with hypoxia ([place])   End stage renal disease ([place])   Essential hypertension   Insulin dependent type 2 diabetes mellitus ([place])   Hypothyroidism   Mixed hyperlipidemia   Dementia ([place])   CAD (coronary artery disease)   Leukocytosis   Anemia due to chronic kidney disease   Acute HFrEF (heart failure with reduced ejection fraction) ([place])   Hypoxic respiratory failure ([place])   Myoclonus   Abnormal EEG       Hospital Course: 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 Recently hospitalized w/ COVID pneumonia 03/22/22-03/26/22, and for NSTEMI 03/09/22-03/13/22.   HPI: Pt brought to ED from home via EMS for SOB. Felt okay yesterday and this morning was feeling very tired and easily worn out / short winded w/ minimal activity. Wife describes associated sweating but no chest pain or fever, no cough. SHe states he had one runny BM this morning, nonbloody, no persistent diarrhea. He continued to feel poorly thoguth home PT today. Family called EMS. Per EMS hypertensive w/ BP 212/110, SpO2 92% RA and dropped to 89% w/ minimal activity, hyperglycemic.   Hospital course:  11/17: in ED, VS hypoxic on RA down to 88-89%, not on home O2, improved on [place] 2L. Afebrile. WBC elevated to 17.0, but not meeting  sepsis criteria. Hypertensive. CXR concerning for pulmonary edema vs pneumonia. BNP elevated to 4110. Cr c/w known ESRD. Hgb baseline at 10.1. Troponin c/w ESRD. Hyperglycemic. EDP started Tx for HCAP (given recent admission to hospital) w/ cefepime and vancomycin. Hospitalist consulted --> admitted to inpatient for acute hypoxic respiratory failure d/t HFrEF w/ volume overload versus/in addition to HCAP.  11/18: tropes up to 500+, cardiology consulted (Dr. Ronny Flurry) and started heparin gtt x48 hours. TSH low, added T3/T4. [place] and MRSA no concerns --> deescalate abx to d/c vanc. HD today. BP better today. Curbside w/ pulmonary (Dr. Lanney Gins) - agree w/ consider CT to reeval pleural effusions if no improvement after dialysis. In HD removed 1000 mL.  11/19: remains on 2L O2 w/ SpO2 99-100% on this. Remains on heparin gtt 11/20: Wife reports starting last night he had some jerking type movements, I and night coverage were not alerted. On eval this AM, he does have some jerking/clonic type movement w/ intentional movement such as repositioning / squeezing my hands but RN reports he is still when asleep. ABG unable to be obtained, VBG ordered along w/ BMP and Mg - K up some, spoke w/ nephro Dr Holley Raring, ordered Lazarus Salines and insulin/dextrose, will repeat levels later today. VBG no significant concerns. CT head findings normal for age. Spoke w/ neurology Dr Quinn Axe who will see him this afternoon --> Concern for myoclonus, obtained EEG which was abnormal. Started on [place]. Dr Quinn Axe recommends transfer to [facility] for continuous EEG. Dr Annice Pih is on overnight at [facility] and will get pt on EEG, w/ Dr Leonides Schanz, Leonel Ramsay, or Hortense Ramal to see him in the AM. I called hospitalist Dr Myna Hidalgo and will accept.   Consultants:  Nephrology - maintain dialysis  Cardiology - elevated troponins Neurology - myoclonus   Procedures: EEG 04/13/22 which was abnormal       ASSESSMENT & PLAN:   Principal Problem:   Seizure  ([place]) Active Problems:   Acute respiratory failure with hypoxia (HCC)   End stage renal disease (HCC)   Essential hypertension   Insulin dependent type 2 diabetes mellitus (HCC)   Hypothyroidism   Mixed hyperlipidemia   Dementia (HCC)   CAD (coronary artery disease)   Leukocytosis   Anemia due to chronic kidney disease   Acute HFrEF (heart failure with reduced ejection fraction) (HCC)   Hypoxic respiratory failure (HCC)   Myoclonus   Abnormal EEG   Seizure w/ myoclonus and abnormal EEG Metabolic encephalopathy Pharmacy was asked to eval Rx for side effects --> d/c chlorpromazine and gabapentin CT head no concerns Neurology consulted --> abnormal EEG starting Keppra  transfer for continuous EEG  Acute respiratory failure with hypoxia (HCC) Acute HFrEF (heart failure with reduced ejection fraction) (HCC) Ddx: fluid overload in setting of HFrEF, possible HCAP Initially treating presumptive HCAP: but given improvement post-dialysis, pneumonia can be ruled OUT and abx d/c Treat fluid overload HFrEF given (+)rales and pulmonary edema on CXR, known pleural effusion Improvement post-dialysis Recent echocardiogram, will not repeat I&O Consider CT chest if clinically worsening Continue supplemental O2 as needed - on room air as of 04/12/22   Leukocytosis - resolved, likely reactive Loose stool in setting of recent hospitalization - resolved Await CDiff screen but this seems unlikely, no further BM and WBC improving  Consider CT chest/abd/pelvis if clinically worsening  BCx NG x2d  CAD (coronary artery disease) NSTEMI treatment but cardiology feels elevated troponins more likely d/t demand ischemia No chest pain  Continue BP control, ASA, Statin, Plavix  troponin increased overnight, repeat again this AM trending up --> consulted cardiology (Dr Humphrey Rolls) plan for heparin gtt 48h, not candidate for intervention at this time  Cardiology following  End stage renal disease ([place]) HD  per nephrology team   Essential hypertension Home meds Prn metoprolol   Insulin dependent type 2 diabetes mellitus ([place]) SSI and basal insulin A1C  Hypothyroidism w/ low TSH  holding home levothyroxine  No recent TSH and last 12/2021 was abnormal, will check TSH (given acute illness, would not change dose synthroid unless significantly abnormal) --> TSH 0.200 adding on T3 and Free T4   Mixed hyperlipidemia Continue statin   Dementia (HCC) Continue donepezil   Hx CVA Continue BP control, ASA, Statin, Plavix   Anemia due to chronic kidney disease Trend CBC   DVT prophylaxis: heparin gtt per cardiology --> D/C today  Pertinent IV fluids/nutrition: no continuous IV fluids Central lines / invasive devices: dialysis catheter R subclavian   Code Status: FULL CODE confirmed w/ patient and family in the ED on admission by Dr Sheppard Coil   Disposition: inpatient  TOC needs: pending clinical course            Discharge Instructions   Current Facility-Administered Medications (Endocrine & Metabolic):    insulin aspart (novoLOG) injection 0-5 Units   insulin aspart (novoLOG) injection 0-9 Units   insulin aspart (novoLOG) injection 2 Units   insulin glargine-yfgn (SEMGLEE) injection 10 Units   Current  Facility-Administered Medications (Cardiovascular):    atorvastatin (LIPITOR) tablet 40 mg   isosorbide-hydrALAZINE (BIDIL) 20-37.5 MG per tablet 1 tablet   losartan (COZAAR) tablet 100 mg   metoprolol succinate (TOPROL-XL) 24 hr tablet 50 mg   metoprolol tartrate (LOPRESSOR) injection 5 mg   Current Facility-Administered Medications (Respiratory):    ipratropium-albuterol (DUONEB) 0.5-2.5 (3) MG/3ML nebulizer solution 3 mL   Current Facility-Administered Medications (Analgesics):    acetaminophen (TYLENOL) tablet 650 mg **OR** acetaminophen (TYLENOL) suppository 650 mg   aspirin EC tablet 81 mg   lidocaine (PF) (XYLOCAINE) 1 % injection 5 mL   oxyCODONE (Oxy  IR/ROXICODONE) immediate release tablet 5 mg   Current Facility-Administered Medications (Hematological):    alteplase (CATHFLO ACTIVASE) injection 2 mg   anticoagulant sodium citrate solution 5 mL   clopidogrel (PLAVIX) tablet 75 mg   heparin injection 1,000 Units   Current Facility-Administered Medications (Other):    calcium acetate (PHOSLO) capsule 1,334 mg   Chlorhexidine Gluconate Cloth 2 % PADS 6 each   cholecalciferol (VITAMIN D3) 25 MCG (1000 UNIT) tablet 5,000 Units   donepezil (ARICEPT) tablet 10 mg   [START ON 04/14/2022] levETIRAcetam (KEPPRA) IVPB 500 mg/100 mL premix **AND** [START ON 04/14/2022] levETIRAcetam (KEPPRA) 250 mg in sodium chloride 0.9 % 100 mL IVPB   levETIRAcetam (KEPPRA) IVPB 1500 mg/ 100 mL premix   lidocaine-prilocaine (EMLA) cream 1 Application   LORazepam (ATIVAN) injection 1 mg   LORazepam (ATIVAN) injection 1 mg   multivitamin (RENA-VIT) tablet 1 tablet   ondansetron (ZOFRAN) tablet 4 mg **OR** ondansetron (ZOFRAN) injection 4 mg   pantoprazole (PROTONIX) EC tablet 40 mg   patiromer (VELTASSA) packet 16.8 g   pentafluoroprop-tetrafluoroeth (GEBAUERS) aerosol 1 Application   sevelamer carbonate (RENVELA) powder PACK 2.4 g  No current outpatient medications on file.     No Known Allergies   Subjective: unable to obtain d/t patient condition. WIfe reports jerking type movement starting last night.    Discharge Exam: BP (!) 169/91 (BP Location: Right Arm)   Pulse 76   Temp 97.9 F (36.6 C)   Resp 20   Ht '5\' 4"'$  (1.626 m)   Wt 63.5 kg   SpO2 100%   BMI 24.03 kg/m  General: Pt is alert, not in acute distress Cardiovascular: RRR, S1/S2 +, no rubs, no gallops Respiratory: CTA bilaterally, no wheezing, no rhonchi Abdominal: Soft, NT, ND, bowel sounds + Extremities: no edema, no cyanosis Neuro: see neurology note for full details - he is exhibiting myoclonic jerking w/ intentional movement, he is not oriented and hsi speech is  slurred     The results of significant diagnostics from this hospitalization (including imaging, microbiology, ancillary and laboratory) are listed below for reference.     Microbiology: Recent Results (from the past 240 hour(s))  Resp Panel by RT-PCR (Flu A&B, Covid) Anterior Nasal Swab     Status: None   Collection Time: 04/10/22  3:25 PM   Specimen: Anterior Nasal Swab  Result Value Ref Range Status   SARS Coronavirus 2 by RT PCR NEGATIVE NEGATIVE Final    Comment: (NOTE) SARS-CoV-2 target nucleic acids are NOT DETECTED.  The SARS-CoV-2 RNA is generally detectable in upper respiratory specimens during the acute phase of infection. The lowest concentration of SARS-CoV-2 viral copies this assay can detect is 138 copies/mL. A negative result does not preclude SARS-Cov-2 infection and should not be used as the sole basis for treatment or other patient management decisions. A negative result may occur with  improper specimen collection/handling, submission of specimen other than nasopharyngeal swab, presence of viral mutation(s) within the areas targeted by this assay, and inadequate number of viral copies(<138 copies/mL). A negative result must be combined with clinical observations, patient history, and epidemiological information. The expected result is Negative.  Fact Sheet for Patients:  EntrepreneurPulse.com.au  Fact Sheet for Healthcare Providers:  IncredibleEmployment.be  This test is no t yet approved or cleared by the [place] FDA and  has been authorized for detection and/or diagnosis of SARS-CoV-2 by FDA under an Emergency Use Authorization (EUA). This EUA will remain  in effect (meaning this test can be used) for the duration of the COVID-19 declaration under Section 564(b)(1) of the Act, 21 U.S.C.section 360bbb-3(b)(1), unless the authorization is terminated  or revoked sooner.       Influenza A by PCR NEGATIVE  NEGATIVE Final   Influenza B by PCR NEGATIVE NEGATIVE Final    Comment: (NOTE) The Xpert Xpress SARS-CoV-2/FLU/RSV plus assay is intended as an aid in the diagnosis of influenza from Nasopharyngeal swab specimens and should not be used as a sole basis for treatment. Nasal washings and aspirates are unacceptable for Xpert Xpress SARS-CoV-2/FLU/RSV testing.  Fact Sheet for Patients: EntrepreneurPulse.com.au  Fact Sheet for Healthcare Providers: IncredibleEmployment.be  This test is not yet approved or cleared by the [place] FDA and has been authorized for detection and/or diagnosis of SARS-CoV-2 by FDA under an Emergency Use Authorization (EUA). This EUA will remain in effect (meaning this test can be used) for the duration of the COVID-19 declaration under Section 564(b)(1) of the Act, 21 U.S.C. section 360bbb-3(b)(1), unless the authorization is terminated or revoked.  Performed at [facility], [address] [place], [place] [address]   Blood culture (routine x 2)     Status: None (Preliminary result)   Collection Time: 04/10/22  4:37 PM   Specimen: BLOOD  Result Value Ref Range Status   Specimen Description BLOOD BLOOD LEFT ARM  Final   Special Requests   Final    BOTTLES DRAWN AEROBIC AND ANAEROBIC Blood Culture adequate volume   Culture   Final    NO GROWTH 3 DAYS Performed at [facility], [address] [place], [place] [address]    Report Status PENDING  Incomplete  Blood culture (routine x 2)     Status: None (Preliminary result)   Collection Time: 04/10/22  4:37 PM   Specimen: BLOOD  Result Value Ref Range Status   Specimen Description BLOOD BLOOD LEFT ARM  Final   Special Requests   Final    BOTTLES DRAWN AEROBIC AND ANAEROBIC Blood Culture adequate volume   Culture   Final    NO GROWTH 3 DAYS Performed at [facility], [address] [place], [place] [address]    Report Status  PENDING  Incomplete  MRSA Next Gen by PCR, Nasal     Status: None   Collection Time: 04/10/22  6:53 PM   Specimen: Nasal Mucosa; Nasal Swab  Result Value Ref Range Status   MRSA by PCR Next Gen NOT DETECTED NOT DETECTED Final    Comment: (NOTE) The GeneXpert MRSA Assay (FDA approved for NASAL specimens only), is one component of a comprehensive MRSA colonization surveillance program. It is not intended to diagnose MRSA infection nor to guide or monitor treatment for MRSA infections. Test performance is not FDA approved in patients less than 80 years old. Performed at [facility], [place], [place]  [address]   C Difficile Quick Screen w PCR reflex     Status: None   Collection Time: 04/12/22 10:00 AM   Specimen: STOOL  Result Value Ref Range Status   C Diff antigen NEGATIVE NEGATIVE Final   C Diff toxin NEGATIVE NEGATIVE Final   C Diff interpretation No C. difficile detected.  Final    Comment: Performed at [facility], [address] [place], [place] [address]  Respiratory (~20 pathogens) panel by PCR     Status: None   Collection Time: 04/13/22  5:15 AM   Specimen: Nasopharyngeal Swab; Respiratory  Result Value Ref Range Status   Adenovirus NOT DETECTED NOT DETECTED Final   Coronavirus 229E NOT DETECTED NOT DETECTED Final    Comment: (NOTE) The Coronavirus on the Respiratory Panel, DOES NOT test for the novel  Coronavirus (2019 nCoV)    Coronavirus HKU1 NOT DETECTED NOT DETECTED Final   Coronavirus NL63 NOT DETECTED NOT DETECTED Final   Coronavirus OC43 NOT DETECTED NOT DETECTED Final   Metapneumovirus NOT DETECTED NOT DETECTED Final   Rhinovirus / Enterovirus NOT DETECTED NOT DETECTED Final   Influenza A NOT DETECTED NOT DETECTED Final   Influenza B NOT DETECTED NOT DETECTED Final   Parainfluenza Virus 1 NOT DETECTED NOT DETECTED Final   Parainfluenza Virus 2 NOT DETECTED NOT DETECTED Final   Parainfluenza Virus 3 NOT DETECTED NOT  DETECTED Final   Parainfluenza Virus 4 NOT DETECTED NOT DETECTED Final   Respiratory Syncytial Virus NOT DETECTED NOT DETECTED Final   Bordetella pertussis NOT DETECTED NOT DETECTED Final   Bordetella Parapertussis NOT DETECTED NOT DETECTED Final   Chlamydophila pneumoniae NOT DETECTED NOT DETECTED Final   Mycoplasma pneumoniae NOT DETECTED NOT DETECTED Final    Comment: Performed at [facility], [place]. [address] [place], [place] [address]     Labs: BNP (last 3 results) Recent Labs    03/09/22 1901 03/22/22 1221 03/31/22 1700  BNP 2,687.5* >4,500.0* 6,433.2*   Basic Metabolic Panel: Recent Labs  Lab 04/10/22 1525 04/10/22 1747 04/11/22 0608 04/13/22 1033 04/13/22 1818  NA 137  --  139 137 136  K 4.8  --  4.6 5.6* 5.6*  CL 101  --  103 104 102  CO2 26  --  '27 27 23  '$ GLUCOSE 332*  --  134* 120* 113*  BUN 23  --  31* 40* 44*  CREATININE 6.31* 6.33* 7.29* 8.68* 9.27*  CALCIUM 8.9  --  8.9 9.2 9.1  MG  --   --   --  2.1  --    Liver Function Tests: Recent Labs  Lab 04/11/22 0608  AST 16  ALT 10  ALKPHOS 110  BILITOT 1.2  PROT 6.0*  ALBUMIN 2.6*   No results for input(s): "LIPASE", "AMYLASE" in the last 168 hours. No results for input(s): "AMMONIA" in the last 168 hours. CBC: Recent Labs  Lab 04/10/22 1525 04/10/22 1747 04/11/22 0608 04/12/22 0232 04/13/22 0540  WBC 17.3* 13.5* 10.1 7.3 9.6  NEUTROABS 15.6*  --   --   --   --   HGB 10.1* 9.6* 9.1* 9.4* 8.5*  HCT 33.3* 31.3* 29.5* 30.4* 28.3*  MCV 85.2 85.5 84.8 85.2 85.5  PLT 184 181 157 170 193   Cardiac Enzymes: No results for input(s): "CKTOTAL", "CKMB", "CKMBINDEX", "TROPONINI" in the last 168 hours. BNP: Invalid input(s): "POCBNP" CBG: Recent Labs  Lab 04/12/22 2025 04/13/22 0301 04/13/22 0819 04/13/22 1226 04/13/22 1610  GLUCAP 145*  112* 111* 122* 126*   D-Dimer No results for input(s): "DDIMER" in the last 72 hours. Hgb A1c Recent Labs    04/11/22 0608  HGBA1C 5.9*   Lipid  Profile No results for input(s): "CHOL", "HDL", "LDLCALC", "TRIG", "CHOLHDL", "LDLDIRECT" in the last 72 hours. Thyroid function studies Recent Labs    04/11/22 0608  TSH 0.200*   Anemia work up No results for input(s): "VITAMINB12", "FOLATE", "FERRITIN", "TIBC", "IRON", "RETICCTPCT" in the last 72 hours. Urinalysis    Component Value Date/Time   COLORURINE Straw 09/11/2013 1933   APPEARANCEUR Clear 09/11/2013 1933   LABSPEC 1.016 09/11/2013 1933   PHURINE 7.0 09/11/2013 1933   GLUCOSEU >=500 09/11/2013 1933   HGBUR Negative 09/11/2013 1933   BILIRUBINUR Negative 09/11/2013 1933   KETONESUR Negative 09/11/2013 1933   PROTEINUR 100 mg/dL 09/11/2013 1933   NITRITE Negative 09/11/2013 1933   LEUKOCYTESUR Negative 09/11/2013 1933   Sepsis Labs Recent Labs  Lab 04/10/22 1747 04/11/22 0608 04/12/22 0232 04/13/22 0540  WBC 13.5* 10.1 7.3 9.6   Microbiology Recent Results (from the past 240 hour(s))  Resp Panel by RT-PCR (Flu A&B, Covid) Anterior Nasal Swab     Status: None   Collection Time: 04/10/22  3:25 PM   Specimen: Anterior Nasal Swab  Result Value Ref Range Status   SARS Coronavirus 2 by RT PCR NEGATIVE NEGATIVE Final    Comment: (NOTE) SARS-CoV-2 target nucleic acids are NOT DETECTED.  The SARS-CoV-2 RNA is generally detectable in upper respiratory specimens during the acute phase of infection. The lowest concentration of SARS-CoV-2 viral copies this assay can detect is 138 copies/mL. A negative result does not preclude SARS-Cov-2 infection and should not be used as the sole basis for treatment or other patient management decisions. A negative result may occur with  improper specimen collection/handling, submission of specimen other than nasopharyngeal swab, presence of viral mutation(s) within the areas targeted by this assay, and inadequate number of viral copies(<138 copies/mL). A negative result must be combined with clinical observations, patient history,  and epidemiological information. The expected result is Negative.  Fact Sheet for Patients:  EntrepreneurPulse.com.au  Fact Sheet for Healthcare Providers:  IncredibleEmployment.be  This test is no t yet approved or cleared by the [place] FDA and  has been authorized for detection and/or diagnosis of SARS-CoV-2 by FDA under an Emergency Use Authorization (EUA). This EUA will remain  in effect (meaning this test can be used) for the duration of the COVID-19 declaration under Section 564(b)(1) of the Act, 21 U.S.C.section 360bbb-3(b)(1), unless the authorization is terminated  or revoked sooner.       Influenza A by PCR NEGATIVE NEGATIVE Final   Influenza B by PCR NEGATIVE NEGATIVE Final    Comment: (NOTE) The Xpert Xpress SARS-CoV-2/FLU/RSV plus assay is intended as an aid in the diagnosis of influenza from Nasopharyngeal swab specimens and should not be used as a sole basis for treatment. Nasal washings and aspirates are unacceptable for Xpert Xpress SARS-CoV-2/FLU/RSV testing.  Fact Sheet for Patients: EntrepreneurPulse.com.au  Fact Sheet for Healthcare Providers: IncredibleEmployment.be  This test is not yet approved or cleared by the [place] FDA and has been authorized for detection and/or diagnosis of SARS-CoV-2 by FDA under an Emergency Use Authorization (EUA). This EUA will remain in effect (meaning this test can be used) for the duration of the COVID-19 declaration under Section 564(b)(1) of the Act, 21 U.S.C. section 360bbb-3(b)(1), unless the authorization is terminated or revoked.  Performed at [facility]  [facility], [address] [place], [place] [address]   Blood culture (routine x 2)     Status: None (Preliminary result)   Collection Time: 04/10/22  4:37 PM   Specimen: BLOOD  Result Value Ref Range Status   Specimen Description BLOOD BLOOD LEFT ARM  Final   Special  Requests   Final    BOTTLES DRAWN AEROBIC AND ANAEROBIC Blood Culture adequate volume   Culture   Final    NO GROWTH 3 DAYS Performed at [facility], [address] [place], [place] [address]    Report Status PENDING  Incomplete  Blood culture (routine x 2)     Status: None (Preliminary result)   Collection Time: 04/10/22  4:37 PM   Specimen: BLOOD  Result Value Ref Range Status   Specimen Description BLOOD BLOOD LEFT ARM  Final   Special Requests   Final    BOTTLES DRAWN AEROBIC AND ANAEROBIC Blood Culture adequate volume   Culture   Final    NO GROWTH 3 DAYS Performed at [facility], [address], [place], [place] [address]    Report Status PENDING  Incomplete  MRSA Next Gen by PCR, Nasal     Status: None   Collection Time: 04/10/22  6:53 PM   Specimen: Nasal Mucosa; Nasal Swab  Result Value Ref Range Status   MRSA by PCR Next Gen NOT DETECTED NOT DETECTED Final    Comment: (NOTE) The GeneXpert MRSA Assay (FDA approved for NASAL specimens only), is one component of a comprehensive MRSA colonization surveillance program. It is not intended to diagnose MRSA infection nor to guide or monitor treatment for MRSA infections. Test performance is not FDA approved in patients less than 31 years old. Performed at [facility], [place], [place] [address]   C Difficile Quick Screen w PCR reflex     Status: None   Collection Time: 04/12/22 10:00 AM   Specimen: STOOL  Result Value Ref Range Status   C Diff antigen NEGATIVE NEGATIVE Final   C Diff toxin NEGATIVE NEGATIVE Final   C Diff interpretation No C. difficile detected.  Final    Comment: Performed at [facility], [place], [place] [address]  Respiratory (~20 pathogens) panel by PCR     Status: None   Collection Time: 04/13/22  5:15 AM   Specimen: Nasopharyngeal Swab; Respiratory  Result Value Ref Range Status   Adenovirus NOT DETECTED NOT DETECTED  Final   Coronavirus 229E NOT DETECTED NOT DETECTED Final    Comment: (NOTE) The Coronavirus on the Respiratory Panel, DOES NOT test for the novel  Coronavirus (2019 nCoV)    Coronavirus HKU1 NOT DETECTED NOT DETECTED Final   Coronavirus NL63 NOT DETECTED NOT DETECTED Final   Coronavirus OC43 NOT DETECTED NOT DETECTED Final   Metapneumovirus NOT DETECTED NOT DETECTED Final   Rhinovirus / Enterovirus NOT DETECTED NOT DETECTED Final   Influenza A NOT DETECTED NOT DETECTED Final   Influenza B NOT DETECTED NOT DETECTED Final   Parainfluenza Virus 1 NOT DETECTED NOT DETECTED Final   Parainfluenza Virus 2 NOT DETECTED NOT DETECTED Final   Parainfluenza Virus 3 NOT DETECTED NOT DETECTED Final   Parainfluenza Virus 4 NOT DETECTED NOT DETECTED Final   Respiratory Syncytial Virus NOT DETECTED NOT DETECTED Final   Bordetella pertussis NOT DETECTED NOT DETECTED Final   Bordetella Parapertussis NOT DETECTED NOT DETECTED Final   Chlamydophila pneumoniae NOT DETECTED NOT DETECTED Final  Mycoplasma pneumoniae NOT DETECTED NOT DETECTED Final    Comment: Performed at [facility], [place] [address] [place], [place] [address]   Imaging DG Chest Portable 1 View  Result Date: 04/10/2022 CLINICAL DATA:  Shortness of breath EXAM: PORTABLE CHEST 1 VIEW COMPARISON:  03/22/2022 FINDINGS: Right-sided tunneled central venous catheter with the tip in the right atrium. Small right pleural effusion. No pneumothorax. Unchanged cardiac and mediastinal contours. Compared to prior exam there are new linear opacities in the right lung base, which are nonspecific, and could represent atelectasis, but infection could have a similar appearance. Prominent interstitial opacities with mild peribronchial cuffing, could suggest a component of mild pulmonary edema. No displaced rib fractures. There is a left axillary vascular stent in place. IMPRESSION: 1. New patchy opacities in the right lung base are nonspecific, and could  represent atelectasis, but infection could have a similar appearance. 2. Trace right pleural effusion. 3. Prominent interstitial opacities with mild peribronchial cuffing, could suggest a component of mild pulmonary edema. Electronically Signed   By: Marin Roberts M.D.   On: 04/10/2022 15:36      Time coordinating discharge: over 30 minutes  SIGNED:  Emeterio Reeve DO [facility]

## 2022-04-13 NOTE — Evaluation (Signed)
Occupational Therapy Evaluation Patient Details Name: Joseph Mcpherson MRN: [id number] DOB: 10/30/1945 Today's Date: 04/13/2022   History of Present Illness Pt is a 76 year old male presenting to the ED via EMA for SOB; Admitted with Acute respiratory failure with hypoxia ([place]), Acute HFrEF; PMH significant for AD/NSTEMI and Hx CVA (on ASA + Plavix), HFrEF w/ EF 40-45%a and hypokinesis, ESRD on HD TTSa, DM2 on home insulin, HLD, HTN, Diabetic neuropathy, Hypothyroidism, HLD, GERD, dementia, anemia chronic disease. Recently hospitalized w/ COVID pneumonia 03/22/22-03/26/22, and for NSTEMI 03/09/22-03/13/22   Clinical Impression   Chart reviewed, pt greeted in bed with wife present throughout. Pt is oriented to self and grossly to place. Pt required increased time for all one step direction following, processing/initiation with multi modal cueing. PTA pt is MOD I-I in ADL/IADL, amb with no AD. Wife reports yesterday was up and amb to bathroom without AD. MAX A required for grooming, dressing tasks.MAX A with HOB raised required for bed mobility 2 attempts, MAX A for static sitting on edge of bed. Provider in room at end of session , team made aware of findings. At this time pt is presenting with deficits in strength, endurance, activity tolerance, cognition, balance affecting safe and optimal ADL completion. Recommend STR at this time to address deficits. OT will continue to follow acutely.      Recommendations for follow up therapy are one component of a multi-disciplinary discharge planning process, led by the attending physician.  Recommendations may be updated based on patient status, additional functional criteria and insurance authorization.   Follow Up Recommendations  Skilled nursing-short term rehab (<3 hours/day)     Assistance Recommended at Discharge Frequent or constant Supervision/Assistance  Patient can return home with the following A lot of help with walking and/or transfers;A lot of help  with bathing/dressing/bathroom    Functional Status Assessment  Patient has had a recent decline in their functional status and demonstrates the ability to make significant improvements in function in a reasonable and predictable amount of time.  Equipment Recommendations  BSC/3in1;Tub/shower seat    Recommendations for Other Services       Precautions / Restrictions Precautions Precautions: Fall Restrictions Weight Bearing Restrictions: No      Mobility Bed Mobility Overal bed mobility: Needs Assistance Bed Mobility: Supine to Sit, Sit to Supine   Sidelying to sit: Max assist, HOB elevated Supine to sit: Max assist, HOB elevated     General bed mobility comments: two attempts- pt with increase in uncontrolled movements during bed mobility/seated on edge of bed    Transfers                          Balance Overall balance assessment: Needs assistance Sitting-balance support: Feet supported Sitting balance-Leahy Scale: Zero Sitting balance - Comments: MAX A for static sitting balance       Standing balance comment: unsafe to attempt upon eval, will continue to assess                           ADL either performed or assessed with clinical judgement   ADL Overall ADL's : Needs assistance/impaired Eating/Feeding: Maximal assistance   Grooming: Maximal assistance;Wash/dry face;Sitting           Upper Body Dressing : Maximal assistance;Cueing for sequencing   Lower Body Dressing: Maximal assistance;Cueing for sequencing       Toileting- Clothing Manipulation and Hygiene: Maximal assistance  General ADL Comments: pt far from baseline even compared to yesterday per wife report     Vision Patient Visual Report: No change from baseline Additional Comments: will continue to assess     Perception     Praxis      Pertinent Vitals/Pain Pain Assessment Pain Assessment: No/denies pain Pain Location: pt endorses no pain  multiple times     Hand Dominance Right   Extremity/Trunk Assessment Upper Extremity Assessment Upper Extremity Assessment: RUE deficits/detail;LUE deficits/detail RUE Deficits / Details: "jerky" movements with any attempted AROM- pt able to grossly perform AROM of all BUE RUE Sensation: WNL RUE Coordination: decreased fine motor;decreased gross motor LUE Sensation: WNL LUE Coordination: decreased fine motor;decreased gross motor   Lower Extremity Assessment Lower Extremity Assessment: Generalized weakness;Defer to PT evaluation (unable to perform SLR either RLE or LLE)       Communication     Cognition Arousal/Alertness: Awake/alert Behavior During Therapy: Flat affect Overall Cognitive Status: Impaired/Different from baseline Area of Impairment: Orientation, Attention, Memory, Following commands, Safety/judgement, Awareness, Problem solving                 Orientation Level: Disoriented to, Time, Situation Current Attention Level: Sustained   Following Commands: Follows one step commands with increased time Safety/Judgement: Decreased awareness of deficits Awareness: Intellectual Problem Solving: Slow processing, Decreased initiation, Difficulty sequencing, Requires verbal cues, Requires tactile cues General Comments: pt with extremely slow responses, at times not responding without multi modal cueing     General Comments  Vital signs monitored, WNL throughout. MD in room at end of session. Team notified of performance, findings    Exercises Other Exercises Other Exercises: edu pt and wife re: role of OT, role of rehab, discharge recommendations, DME use   Shoulder Instructions      Home Living Family/patient expects to be discharged to:: Private residence Living Arrangements: Spouse/significant other Available Help at Discharge: Family;Available 24 hours/day Type of Home: House Home Access: Stairs to enter [employer] of Steps: 3 Entrance  Stairs-Rails: Right;Left;Can reach both Home Layout: Two level;Able to live on main level with bedroom/bathroom     Bathroom Shower/Tub: Occupational psychologist: Standard Bathroom Accessibility: Yes   Home Equipment: Grab bars - toilet;Grab bars - tub/shower;Rolling Walker (2 wheels)          Prior Functioning/Environment Prior Level of Function : Independent/Modified Independent             Mobility Comments: per pt wife, pt amb with no AD up until yesterday ADLs Comments: pt wife reports pt generally MOD I-I in ADl/IADL        OT Problem List: Decreased strength;Decreased activity tolerance;Impaired balance (sitting and/or standing);Decreased safety awareness;Decreased cognition;Decreased coordination;Decreased knowledge of use of DME or AE      OT Treatment/Interventions: Self-care/ADL training;Patient/family education;Therapeutic exercise;Balance training;Energy conservation;Therapeutic activities;DME and/or AE instruction;Cognitive remediation/compensation    OT Goals(Current goals can be found in the care plan section) Acute Rehab OT Goals Patient Stated Goal: manage medically, improve functional status OT Goal Formulation: With family Time For Goal Achievement: 04/27/22 Potential to Achieve Goals: Good ADL Goals Pt Will Perform Grooming: with modified independence Pt Will Perform Lower Body Dressing: sit to/from stand;with supervision Pt Will Transfer to Toilet: with supervision Pt Will Perform Toileting - Clothing Manipulation and hygiene: with supervision  OT Frequency: Min 2X/week    Co-evaluation              AM-PAC OT "6 Clicks" Daily Activity  Outcome Measure Help from another person eating meals?: A Lot Help from another person taking care of personal grooming?: A Lot Help from another person toileting, which includes using toliet, bedpan, or urinal?: A Lot Help from another person bathing (including washing, rinsing, drying)?: A  Lot Help from another person to put on and taking off regular upper body clothing?: A Lot Help from another person to put on and taking off regular lower body clothing?: A Lot 6 Click Score: 12   End of Session Nurse Communication: Mobility status  Activity Tolerance: Treatment limited secondary to medical complications (Comment) Patient left: in bed;with call bell/phone within reach;with bed alarm set;with family/visitor present (Dr Sheppard Coil in room)  OT Visit Diagnosis: Unsteadiness on feet (R26.81);Muscle weakness (generalized) (M62.81)                Time: 7412-8786 OT Time Calculation (min): 19 min Charges:  OT General Charges $OT Visit: 1 Visit OT Evaluation $OT Eval Moderate Complexity: 1 Mod  Shanon Payor, OTD OTR/L  04/13/22, 12:49 PM

## 2022-04-13 NOTE — Plan of Care (Signed)
  Problem: Coping: Goal: Level of anxiety will decrease Outcome: Progressing   Problem: Pain Managment: Goal: General experience of comfort will improve Outcome: Progressing   Problem: Safety: Goal: Ability to remain free from injury will improve Outcome: Progressing   

## 2022-04-13 NOTE — Consult Note (Signed)
ANTICOAGULATION CONSULT NOTE  Pharmacy Consult for Heparin Infusion Indication: chest pain/ACS  Patient Measurements: Height: '5\' 4"'$  (162.6 cm) Weight: 63.5 kg (139 lb 15.9 oz) IBW/kg (Calculated) : 59.2 Heparin Dosing Weight: 65.6  Labs: Recent Labs    04/10/22 1525 04/10/22 1718 04/10/22 1747 04/10/22 2043 04/11/22 0603 04/11/22 0608 04/11/22 1144 04/12/22 0232 04/12/22 1055 04/13/22 0540  HGB 10.1*  --  9.6*  --   --  9.1*  --  9.4*  --  8.5*  HCT 33.3*  --  31.3*  --   --  29.5*  --  30.4*  --  28.3*  PLT 184  --  181  --   --  157  --  170  --  193  APTT  --   --   --   --   --   --  37*  --   --   --   LABPROT  --   --   --   --   --   --  13.2  --   --   --   INR  --   --   --   --   --   --  1.0  --   --   --   HEPARINUNFRC  --   --   --   --   --   --   --  0.60 0.34 0.18*  CREATININE 6.31*  --  6.33*  --   --  7.29*  --   --   --   --   TROPONINIHS 37* 98*  --  310* 529*  --   --   --   --   --      Estimated Creatinine Clearance: 7.2 mL/min (A) (by C-G formula based on SCr of 7.29 mg/dL (H)).  Medical History: Past Medical History:  Diagnosis Date   Clotting disorder ([place])    per daughter, "when patient travels, his feet swell"   Diabetes mellitus without complication ([place])    type II   Diabetic eye exam ([place]) 12/2019   ESRD (end stage renal disease) ([place])    MWF- [place]   GERD (gastroesophageal reflux disease)    [name] (hard of hearing)    Hyperlipidemia    Hypertension    Hypothyroidism    Thyroid disease     Medications:  No chronic anticoagulation PTA noted from chart review ASA / Plavix PTA  Assessment: 76 yo M presents with SOB, fatigue and weakness. Troponins trending up, most recently 529. ECG shows lateral ischemia and patient is not a candidate for PCI given condition and renal failure. Heparin to run for 48 hrs.  Baseline Labs: aPTT - 37; INR - 1.0 Hgb - 9.6; Plts - 181  Goal of Therapy:  Heparin level 0.3-0.7  units/ml Monitor platelets by anticoagulation protocol: Yes   1119 0232 HL 0.6, therapeutic x 1 1119 1055 HL 0.34, therapeutic x 2 1120 0540 HL 0.18, subtherapeutic  Plan: Bolus 2000 units x 1 Increase heparin infusion to 950 units/hr Recheck HL in 8 hr after rate change CBC daily while on heparin  Renda Rolls, PharmD, [facility] 04/13/2022 6:43 AM

## 2022-04-13 NOTE — Consult Note (Incomplete)
ANTICOAGULATION CONSULT NOTE  Pharmacy Consult for Heparin Infusion Indication: chest pain/ACS  Patient Measurements: Height: '5\' 4"'$  (162.6 cm) Weight: 63.5 kg (139 lb 15.9 oz) IBW/kg (Calculated) : 59.2 Heparin Dosing Weight: 65.6  Labs: Recent Labs    04/10/22 1525 04/10/22 1718 04/10/22 1747 04/10/22 2043 04/11/22 0603 04/11/22 0608 04/11/22 1144 04/12/22 0232 04/12/22 1055 04/13/22 0540  HGB 10.1*  --  9.6*  --   --  9.1*  --  9.4*  --  8.5*  HCT 33.3*  --  31.3*  --   --  29.5*  --  30.4*  --  28.3*  PLT 184  --  181  --   --  157  --  170  --  193  APTT  --   --   --   --   --   --  37*  --   --   --   LABPROT  --   --   --   --   --   --  13.2  --   --   --   INR  --   --   --   --   --   --  1.0  --   --   --   HEPARINUNFRC  --   --   --   --   --   --   --  0.60 0.34 0.18*  CREATININE 6.31*  --  6.33*  --   --  7.29*  --   --   --   --   TROPONINIHS 37* 98*  --  310* 529*  --   --   --   --   --      Estimated Creatinine Clearance: 7.2 mL/min (A) (by C-G formula based on SCr of 7.29 mg/dL (H)).  Medical History: Past Medical History:  Diagnosis Date   Clotting disorder ([place])    per daughter, "when patient travels, his feet swell"   Diabetes mellitus without complication ([place])    type II   Diabetic eye exam ([place]) 12/2019   ESRD (end stage renal disease) ([place])    MWF-    GERD (gastroesophageal reflux disease)    [name] (hard of hearing)    Hyperlipidemia    Hypertension    Hypothyroidism    Thyroid disease     Medications:  Scheduled:   aspirin EC  81 mg Oral Daily   atorvastatin  40 mg Oral Daily   calcium acetate  1,334 mg Oral TID WC   Chlorhexidine Gluconate Cloth  6 each Topical Q0600   chlorproMAZINE  25 mg Oral TID   cholecalciferol  5,000 Units Oral Daily   clopidogrel  75 mg Oral Daily   donepezil  10 mg Oral Daily   gabapentin  300 mg Oral QHS   insulin aspart  0-5 Units Subcutaneous QHS   insulin aspart  0-9 Units  Subcutaneous TID WC   insulin aspart  2 Units Subcutaneous TID WC   insulin glargine-yfgn  10 Units Subcutaneous QHS   isosorbide-hydrALAZINE  1 tablet Oral TID   losartan  100 mg Oral QPM   metoprolol succinate  50 mg Oral Daily   multivitamin  1 tablet Oral Daily   pantoprazole  40 mg Oral Daily   sevelamer carbonate  2.4 g Oral Q breakfast   Infusions:   anticoagulant sodium citrate     heparin 950 Units/hr (04/13/22 0949)   PRN: acetaminophen **OR** acetaminophen, alteplase, anticoagulant  sodium citrate, heparin, ipratropium-albuterol, lidocaine (PF), lidocaine-prilocaine, metoprolol tartrate, ondansetron **OR** ondansetron (ZOFRAN) IV, oxyCODONE, pentafluoroprop-tetrafluoroeth  Assessment: Joseph Mcpherson is a 76 y.o. male presenting with SOB, fatigue and weakness. PMH significant for CAD/NSTEMI/CVA (on ASA + Plavix), HFrEF (EF 40-45%, hypokinesis), ESRD on HD TuThSa, T2DM, HLD, HTN, neuropathy, hypothyroidism, GERD, dementia, AoCD. hsTrop up to 529 un admission. Patient was not on [facility] PTA per chart review. ECG shows lateral ischemia; patient not a candidate for PCI given condition and renal failure. Pharmacy has been consulted to initiate and manage heparin infusion.   Baseline Labs: aPTT 37, INR 1.0, Hgb 9.6, Plts 181  Goal of Therapy:  Heparin level 0.3-0.7 units/ml Monitor platelets by anticoagulation protocol: Yes  Date Time HL Rate/Comment  11/19 0232 0.6 750/therapeutic x 1 11/19 1055 0.34 750/therapeutic x 2 11/20 0540 0.18 750/SUBtherapeutic  Plan: Continue heparin infusion at 950 units/hr Check HL in 8 hours  Continue to monitor H&H and platelets daily while on heparin infusion   Gretel Acre, PharmD PGY1 Pharmacy Resident 04/13/2022 10:40 AM

## 2022-04-13 NOTE — Consult Note (Signed)
Addendum 11/20 @ 2000:  EEG showed multiple abnormalities as follows: - Mild diffuse slowing indicative of global cerebral dysfunction - Intermittent focal slowing, at times rhythmic, over the left posterior region indicative of focal cerebral dysfunction in that area - Frequent sharp waves (focal L parietal, less often generalized) indicating increased epileptogenicity. The majority of these do not display triphasic morphology (ie not favored to be secondary to metabolic derangement). 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 Patient given '1500mg'$  IV keppra and maint ESRD dosing as well as '1mg'$  ativan. Etiology of EEG abnormalities is unclear but is increasingly concerning for epileptic etiology given fluctuating but overall deteriorating mental status. Transfer to [facility] for continuous EEG is recommended. Discussed with Dr. Sheppard Coil and Dr. Lorrin Goodell. Neurology will continue to follow in consult at [facility].  Su Monks, MD [facility] [phone]  If 7pm- 7am, please page neurology on call as listed in [place].   NEUROLOGY CONSULTATION NOTE   Date of service: April 13, 2022 Patient Name: Joseph Mcpherson MRN:  [id number] DOB:  Jan 05, 1946 Reason for consult: jerking movements Requesting physician: Dr. 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  He was hospitalized twice in the  last month, 1 with COVID-pneumonia and 1 with NSTEMI.  He was brought in to the ED from home for shortness of breath and feeling very tired and easily worn out.  He was mildly hypoxic on room air on arrival to ED at sats of 88 to 89%.  This improved on 2 L of nasal cannula.  He was afebrile and white blood cell count was elevated to 17 but he did not meet criteria for sepsis.  He was noted to be hypertensive with a blood pressure of 212/110.  Chest x-ray was concerning for pulmonary edema versus pneumonia.  BNP elevated to 4110.  Creatinine was consistent with known ESRD.  ED started treatment for HCAP with cefepime and Vanco.  He was admitted for acute hypoxic respiratory failure secondary to heart failure with reduced ejection fraction with volume overload versus HCAP.  The day after admission troponins trended up to over 500.  Cardiology was consulted and was concern for NSTEMI so they started a heparin drip for 48 hours.  He was improving until yesterday 11/19 when he developed new onset diffuse myoclonus. No hx of this in the past. On stable dose of home gabapentin '300mg'$  qhs for 2 yrs. No new medications likely to cause it. Labwork today unremarkable other than K+ 5.6, Cr 8.68, BUN 40 (from 23 three days ago). No sig abnl to explain on VBG. TSH 0.200 with FT4 1.49 (known hypothyroidism, synthroid held). Has not gotten LFTs or ammonia level. 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   Family reports that patient is slightly more confused today than usual. He has mild baseline dementia.  chlorpromazine d/c'd 11/20 2/2 concern this might be causing the myoclonus  ROS   UTA 2/2 mental status  Past History   I have reviewed the following:  Past Medical History:  Diagnosis Date   Clotting disorder ([place])    per daughter, "when patient travels, his feet swell"   Diabetes mellitus without complication ([place])    type II   Diabetic eye exam ([place]) 12/2019   ESRD (end stage renal disease) ([place])     MWF- [place]   GERD (gastroesophageal reflux disease)    [name] (hard of hearing)    Hyperlipidemia    Hypertension    Hypothyroidism    Thyroid disease    Past Surgical History:  Procedure Laterality Date   AV FISTULA PLACEMENT Right 10/22/2017   Procedure: ARTERIOVENOUS (AV) BRACHIOCEPHALIC FISTULA CREATION RIGHT UPPER ARM;  Surgeon: Conrad Rentz, MD;  Location: [place];  Service: Vascular;  Laterality: Right;   AV FISTULA PLACEMENT Left 02/03/2019   Procedure: INSERTION OF ARTERIOVENOUS (AV) GORE-TEX GRAFT ARM (BRACHIAL AXILLARY );  Surgeon: [name], MD;  Location: [facility];  Service: Vascular;  Laterality: Left;   AV FISTULA PLACEMENT Left 12/10/2020   Procedure: LEFT ARM BRACHIOCEPHALIC ARTERIOVENOUS (AV) FISTULA CREATION;  Surgeon: [name], MD;  Location: [place];  Service: Vascular;  Laterality: Left;   IR CHOLANGIOGRAM EXISTING TUBE  03/18/2022   IR PERC CHOLECYSTOSTOMY  01/21/2022   UPPER EXTREMITY ANGIOGRAPHY Left 09/19/2019   Procedure: UPPER EXTREMITY ANGIOGRAPHY;  Surgeon: [name], MD;  Location: [place] [facility];  Service: Cardiovascular;  Laterality: Left;   Family History  Problem Relation Age of Onset   Diabetes Mother    Hyperlipidemia Mother    Hypertension Father    Social History   Socioeconomic History   Marital status: Married    Spouse name: Not on file   Number of children: Not on file   Years of education: 12   Highest education level: 12th grade  Occupational History   Occupation: Retired  Tobacco Use   Smoking status: Never   Smokeless tobacco: Current    Types: Snuff  Vaping Use   Vaping Use: Never used  Substance and Sexual Activity   Alcohol use: No   Drug use: No   Sexual activity: Not Currently  Other Topics Concern   Not on file  Social History Narrative   Not on file   Social Determinants of Health   Financial Resource Strain: Low Risk  (06/19/2021)   Overall Financial Resource Strain (CARDIA)     Difficulty of Paying Living Expenses: Not hard at all  Food Insecurity: No Food Insecurity (03/27/2022)   Hunger Vital Sign    Worried About Running Out of Food in the Last Year: Never true    [place] in the Last Year: Never true  Transportation Needs: No Transportation Needs (03/27/2022)   PRAPARE - Hydrologist (Medical): No    Lack of Transportation (Non-Medical): No  Physical Activity: Sufficiently Active (06/19/2021)   Exercise Vital Sign    Days of Exercise per Week: 7 days    Minutes of Exercise per Session: 30 min  Stress: No Stress Concern Present (06/19/2021)   [place]    Feeling of Stress : Not at all  Social Connections: [place] (06/19/2021)   Social Connection and Isolation Panel [NHANES]    Frequency of Communication with Friends and Family: More than three times a week    Frequency of Social  Gatherings with Friends and Family: More than three times a week    Attends Religious Services: More than 4 times per year    Active Member of Clubs or Organizations: Yes    Attends Music therapist: More than 4 times per year    Marital Status: Married   No Known Allergies  Medications   Medications Prior to Admission  Medication Sig Dispense Refill Last Dose   albuterol (PROVENTIL) (2.5 MG/3ML) 0.083% nebulizer solution Take 3 mLs (2.5 mg total) by nebulization every 6 (six) hours as needed for wheezing or shortness of breath. 150 mL 1 Past Week   amLODipine (NORVASC) 10 MG tablet Take 1 tablet (10 mg total) by mouth daily. 30 tablet 3 Past Week   aspirin EC 81 MG tablet Take 81 mg by mouth daily. Swallow whole.   Past Week   atorvastatin (LIPITOR) 40 MG tablet Take 1 tablet (40 mg total) by mouth daily. 90 tablet 1 Past Week   B Complex-C-Folic Acid (RENA-VITE RX) 1 MG TABS Take 1 tablet by mouth daily.    Past Week   calcium acetate (PHOSLO) 667 MG  capsule TAKE 2 CAPSULES BY MOUTH 3 TIMES A DAY WITH MEAL 540 capsule 1 Past Week   Cholecalciferol (VITAMIN D3) 5000 units TABS Take 5,000 Units by mouth daily.    Past Week   clopidogrel (PLAVIX) 75 MG tablet Take 1 tablet (75 mg total) by mouth daily. 90 tablet 1 Past Week   donepezil (ARICEPT) 10 MG tablet TAKE 1 TABLET BY MOUTH EVERY DAY 90 tablet 3 Past Week   gabapentin (NEURONTIN) 300 MG capsule TAKE 1 CAPSULE BY MOUTH EVERY DAY AT BEDTIME AS DIRECTED 90 capsule 3 Past Week   insulin detemir (LEVEMIR) 100 UNIT/ML injection Inject 0.15 mLs (15 Units total) into the skin at bedtime. 10 mL 11 Past Week   isosorbide mononitrate (IMDUR) 30 MG 24 hr tablet Take 30 mg by mouth daily.   Past Week   levothyroxine (SYNTHROID) 137 MCG tablet TAKE 1 TABLET BY MOUTH EVERY DAY 90 tablet 3 Past Week   losartan (COZAAR) 100 MG tablet Take 1 tablet (100 mg total) by mouth every evening. 30 tablet 3 Past Week   metoprolol succinate (TOPROL-XL) 50 MG 24 hr tablet TAKE 1 TABLET BY MOUTH EVERY DAY 90 tablet 3 Past Week   pantoprazole (PROTONIX) 40 MG tablet TAKE 1 TABLET BY MOUTH EVERY DAY 90 tablet 3 Past Week   sevelamer carbonate (RENVELA) 2.4 g PACK Take 2.4 g by mouth daily. 90 each 3 Past Week   sitaGLIPtin (JANUVIA) 50 MG tablet Take 50 mg by mouth every evening.    Past Week   BD PEN NEEDLE NANO 2ND GEN 32G X 4 MM MISC USE AS DIRECTED 300 each 1    chlorproMAZINE (THORAZINE) 25 MG tablet TAKE 1 TABLET BY MOUTH THREE TIMES A DAY (Patient not taking: Reported on 04/11/2022) 540 tablet 1 Not Taking   Continuous Blood Gluc Sensor (FREESTYLE LIBRE 14 DAY SENSOR) MISC APPLY EVERY 14 (FOURTEEN) DAYS. 90 each 1    ipratropium-albuterol (DUONEB) 0.5-2.5 (3) MG/3ML SOLN Take 3 mLs by nebulization every 6 (six) hours as needed. 120 mL 4 prn   Multiple Vitamins-Minerals (MULTIVITAMIN WITH MINERALS) tablet Take 1 tablet by mouth daily. (Patient not taking: Reported on 04/11/2022)   Not Taking   predniSONE (DELTASONE)  10 MG tablet '40mg'$  daily x 1 day, '30mg'$  daily x 1 day, '20mg'$  daily x 1 day, '10mg'$  daily  x 1 day then stop (Patient not taking: Reported on 04/11/2022) 10 tablet 0 Not Taking   sodium chloride flush (NS) 0.9 % SOLN Inject 5 mLs into the vein daily. Flush cholecystostomy tube daily with 5 ccs of NS 300 mL 0       Current Facility-Administered Medications:    acetaminophen (TYLENOL) tablet 650 mg, 650 mg, Oral, Q6H PRN **OR** acetaminophen (TYLENOL) suppository 650 mg, 650 mg, Rectal, Q6H PRN, Emeterio Reeve, DO   alteplase (CATHFLO ACTIVASE) injection 2 mg, 2 mg, Intracatheter, Once PRN, Colon Flattery, NP   anticoagulant sodium citrate solution 5 mL, 5 mL, Intracatheter, PRN, Colon Flattery, NP   aspirin EC tablet 81 mg, 81 mg, Oral, Daily, Emeterio Reeve, DO, 81 mg at 04/12/22 0959   atorvastatin (LIPITOR) tablet 40 mg, 40 mg, Oral, Daily, Emeterio Reeve, DO, 40 mg at 04/12/22 1000   calcium acetate (PHOSLO) capsule 1,334 mg, 1,334 mg, Oral, TID WC, Emeterio Reeve, DO, 1,334 mg at 04/12/22 1652   Chlorhexidine Gluconate Cloth 2 % PADS 6 each, 6 each, Topical, Q0600, Colon Flattery, NP, 6 each at 04/13/22 0545   cholecalciferol (VITAMIN D3) 25 MCG (1000 UNIT) tablet 5,000 Units, 5,000 Units, Oral, Daily, Emeterio Reeve, DO, 5,000 Units at 04/12/22 0959   clopidogrel (PLAVIX) tablet 75 mg, 75 mg, Oral, Daily, Emeterio Reeve, DO, 75 mg at 04/12/22 1000   donepezil (ARICEPT) tablet 10 mg, 10 mg, Oral, Daily, Emeterio Reeve, DO, 10 mg at 04/12/22 0959   heparin ADULT infusion 100 units/mL (25000 units/252m), 950 Units/hr, Intravenous, Continuous, Belue, NAlver Sorrow RPH, Last Rate: 9.5 mL/hr at 04/13/22 0949, 950 Units/hr at 04/13/22 0949   heparin injection 1,000 Units, 1,000 Units, Intracatheter, PRN, BColon Flattery NP   insulin aspart (novoLOG) injection 0-5 Units, 0-5 Units, Subcutaneous, QHS, AEmeterio Reeve DO, 1 Units at 04/12/22 2055   insulin aspart  (novoLOG) injection 0-9 Units, 0-9 Units, Subcutaneous, TID WC, AEmeterio Reeve DO, 1 Units at 04/13/22 1238   insulin aspart (novoLOG) injection 2 Units, 2 Units, Subcutaneous, TID WC, AEmeterio Reeve DO, 2 Units at 04/12/22 1312   insulin glargine-yfgn (SEMGLEE) injection 10 Units, 10 Units, Subcutaneous, QHS, AEmeterio Reeve DO, 10 Units at 04/12/22 2055   ipratropium-albuterol (DUONEB) 0.5-2.5 (3) MG/3ML nebulizer solution 3 mL, 3 mL, Nebulization, Q4H PRN, AEmeterio Reeve DO   isosorbide-hydrALAZINE (BIDIL) 20-37.5 MG per tablet 1 tablet, 1 tablet, Oral, TID, KNeoma LamingA, MD, 1 tablet at 04/12/22 2055   lidocaine (PF) (XYLOCAINE) 1 % injection 5 mL, 5 mL, Intradermal, PRN, BGwyneth Revels Shantelle, NP   lidocaine-prilocaine (EMLA) cream 1 Application, 1 Application, Topical, PRN, BColon Flattery NP   losartan (COZAAR) tablet 100 mg, 100 mg, Oral, QPM, AEmeterio Reeve DO, 100 mg at 04/12/22 1936   metoprolol succinate (TOPROL-XL) 24 hr tablet 50 mg, 50 mg, Oral, Daily, AEmeterio Reeve DO, 50 mg at 04/12/22 1000   metoprolol tartrate (LOPRESSOR) injection 5 mg, 5 mg, Intravenous, Q6H PRN, AEmeterio Reeve DO   multivitamin (RENA-VIT) tablet 1 tablet, 1 tablet, Oral, Daily, AEmeterio Reeve DO, 1 tablet at 04/12/22 0959   ondansetron (ZOFRAN) tablet 4 mg, 4 mg, Oral, Q6H PRN **OR** ondansetron (ZOFRAN) injection 4 mg, 4 mg, Intravenous, Q6H PRN, AEmeterio Reeve DO   oxyCODONE (Oxy IR/ROXICODONE) immediate release tablet 5 mg, 5 mg, Oral, Q6H PRN, AEmeterio Reeve DO   pantoprazole (PROTONIX) EC tablet 40 mg, 40 mg, Oral, Daily, AEmeterio Reeve DO, 40 mg at 04/12/22 1000   patiromer (VELTASSA) packet 16.8 g, 16.8  g, Oral, Daily, Alexander, Lanelle Bal, DO   pentafluoroprop-tetrafluoroeth (GEBAUERS) aerosol 1 Application, 1 Application, Topical, PRN, Colon Flattery, NP   sevelamer carbonate (RENVELA) powder PACK 2.4 g, 2.4 g, Oral, Q breakfast, Emeterio Reeve, DO, 2.4 g at 04/12/22 1000  Vitals   Vitals:   04/13/22 0818 04/13/22 1024 04/13/22 1145 04/13/22 1607  BP: (!) 146/52 (!) 157/65 (!) 146/49 (!) 169/91  Pulse: 61 65 61 76  Resp: '18  14 20  '$ Temp: 98.7 F (37.1 C)  98.2 F (36.8 C) 97.9 F (36.6 C)  TempSrc:      SpO2: 100% 98% 98% 100%  Weight:      Height:         Body mass index is 24.03 kg/m.  Physical Exam   Physical Exam Gen: alert, oriented x3 also family says he has become intermittently confused in conversations today which is new for him since yesterday. He is intermittently tearful and says he is scared. Follows simple commands. HEENT: Atraumatic, normocephalic;mucous membranes moist; oropharynx clear, tongue without atrophy or fasciculations. Neck: Supple, trachea midline. Resp: CTAB, no w/r/r CV: RRR, no m/g/r; nml S1 and S2. 2+ symmetric peripheral pulses. Abd: soft/NT/ND; nabs x 4 quad Extrem: Nml bulk; no cyanosis, clubbing, or edema.  Neuro: *MS: alert, oriented x3 also family says he has become intermittently confused in conversations today which is new for him since yesterday. He is intermittently tearful and says he is scared. Follows simple commands. *Speech: fluid, nondysarthric, able to name and repeat *CN:    I: Deferred   II,III: PERRLA, VFF by confrontation, optic discs unable to be visualized 2/2 pupillary constriction   III,IV,VI: EOMI w/o nystagmus, no ptosis   V: Sensation intact from V1 to V3 to LT   VII: Eyelid closure was full.  Smile symmetric.   VIII: Hearing intact to voice   IX,X: Voice normal, palate elevates symmetrically    XI: SCM/trap 5/5 bilat   XII: Tongue protrudes midline, no atrophy or fasciculations   *Motor:   Normal bulk.  No pronator drift, full strength throughout all extremities. He has frequent bouts of quick jerks c/w myoclonus. Some are generalized, others multifocal (bilat eyelid myoclonus, abdominal myoclonus, synchronous plantarflexion myoclonus).  Pattern is migratory, at times stops completely for <1 min then resumes. He is not altered during my examination. *Sensory: SILT. *Coordination:  FNF without ataxia bilat *Reflexes:  2+ and symmetric throughout without clonus; toes down-going bilat *Gait: deferred  NIHSS = 0  Premorbid mRS = 1   Labs   CBC:  Recent Labs  Lab 04/10/22 1525 04/10/22 1747 04/12/22 0232 04/13/22 0540  WBC 17.3*   < > 7.3 9.6  NEUTROABS 15.6*  --   --   --   HGB 10.1*   < > 9.4* 8.5*  HCT 33.3*   < > 30.4* 28.3*  MCV 85.2   < > 85.2 85.5  PLT 184   < > 170 193   < > = values in this interval not displayed.    Basic Metabolic Panel:  Lab Results  Component Value Date   NA 137 04/13/2022   K 5.6 (H) 04/13/2022   CO2 27 04/13/2022   GLUCOSE 120 (H) 04/13/2022   BUN 40 (H) 04/13/2022   CREATININE 8.68 (H) 04/13/2022   CALCIUM 9.2 04/13/2022   GFRNONAA 6 (L) 04/13/2022   GFRAA 7 (L) 05/04/2019   Lipid Panel:  Lab Results  Component Value Date   LDLCALC 55 05/05/2019  HgbA1c:  Lab Results  Component Value Date   HGBA1C 5.9 (H) 04/11/2022   Urine Drug Screen: No results found for: "LABOPIA", "COCAINSCRNUR", "LABBENZ", "AMPHETMU", "THCU", "LABBARB"  Alcohol Level No results found for: "ETH"  CT head noncon 11/20 NAICP personal review  Impression   This is a 76 year old gentleman with past medical history significant for CAD/NSTEMI, history of CVA on aspirin and Plavix at home, heart failure with reduced ejection fraction, 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  The day after admission troponins trended up to over 500.  Cardiology was consulted and was concern for NSTEMI so they started a heparin drip for 48  hours.  He was improving until yesterday 11/19 when he developed new onset diffuse myoclonus. On examination some is generalized, other multifocal in migratory pattern. No hx of this in the past. On stable dose of home gabapentin '300mg'$  qhs for 2 yrs. No new medications likely to cause it. Labwork today unremarkable other than K+ 5.6, Cr 8.68, BUN 40 (from 23 three days ago). No sig abnl to explain on VBG. TSH 0.200 with FT4 1.49 (known hypothyroidism, synthroid held). Has not gotten LFTs or ammonia level. 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   Given the pattern of his myoclonus I favor this to be 2/2 systemic issue such as medication. 2 most probably culprits are gabapentin and chlorpromazine which were both d/c'd today.  He is oriented on my examination but family reports he has become intermittently more confused than usual today, therefore will obtain EEG to rule out epileptic etiology. He has no known hx seizures.   Recommendations   - D/c gabapentin and chlorpromazine - Nephrology planning to dialyze tmrw - rEEG today - LFTs, ammonia level - Will continue to follow ______________________________________________________________________   Thank you for the opportunity to take part in the care of this patient. If you have any further questions, please contact the neurology consultation attending.  Signed,  Su Monks, MD [facility] [phone]  If 7pm- 7am, please page neurology on call as listed in [place].  **Any copied and pasted documentation in this note was written by me in another application not billed for and pasted by me into this document.

## 2022-04-13 NOTE — Progress Notes (Addendum)
PROGRESS NOTE    Joseph Mcpherson   QQP:[id number] DOB: [birth year]  DOA: 04/10/2022 Date of Service: 04/13/22 PCP: [name], MD     Brief Narrative / Hospital Course:  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 Recently hospitalized w/ COVID pneumonia 03/22/22-03/26/22, and for NSTEMI 03/09/22-03/13/22.   HPI: Pt brought to ED from home via EMS for SOB. Felt okay yesterday and this morning was feeling very tired and easily worn out / short winded w/ minimal activity. Wife describes associated sweating but no chest pain or fever, no cough. SHe states he had one runny BM this morning, nonbloody, no persistent diarrhea. He continued to feel poorly thoguth home PT today. Family called EMS. Per EMS hypertensive w/ BP 212/110, SpO2 92% RA and dropped to 89% w/ minimal activity, hyperglycemic.   Hospital course:  11/17: in ED, VS hypoxic on RA down to 88-89%, not on home O2, improved on [place] 2L. Afebrile. WBC elevated to 17.0, but not meeting sepsis criteria. Hypertensive. CXR concerning for pulmonary edema vs pneumonia. BNP elevated to 4110. Cr c/w known ESRD. Hgb baseline at 10.1. Troponin c/w ESRD. Hyperglycemic. EDP started Tx for HCAP (given recent admission to hospital) w/ cefepime and vancomycin. Hospitalist consulted --> admitted to inpatient for acute hypoxic respiratory failure d/t HFrEF w/ volume overload versus/in addition to HCAP.  11/18: tropes up to 500+, cardiology consulted (Dr. Ronny Flurry) and started heparin gtt x48 hours. TSH low, added T3/T4. Procal and MRSA no concerns --> deescalate abx to d/c vanc. HD today. BP better today. Curbside w/ pulmonary (Dr. Lanney Gins) - agree w/ consider CT to reeval pleural effusions if no improvement after dialysis. In HD removed 1000 mL.  11/19: remains  on 2L O2 w/ SpO2 99-100% on this. Remains on heparin gtt 11/20: Wife reports starting last night he had some jerking type movements, I and night coverage were not alerted. On eval this AM, he does have some jerking/clonic type movement w/ intentional movement such as repositioning / squeezing my hands but RN reports he is still when asleep. ABG unable to be obtained, VBG ordered along w/ BMP and Mg - K up some, spoke w/ nephro Dr Holley Raring, ordered Lazarus Salines and insulin/dextrose, will repeat levels later today. VBG no significant concerns. CT head findings normal for age. Spoke w/ neurology Dr Quinn Axe who will see him this afternoon.   Consultants:  Nephrology   Procedures: none      ASSESSMENT & PLAN:   Active Problems:   Acute respiratory failure with hypoxia (HCC)   End stage renal disease (HCC)   Essential hypertension   Insulin dependent type 2 diabetes mellitus ([place])   Hypothyroidism   Mixed hyperlipidemia   Dementia (HCC)   CAD (coronary artery disease)   Leukocytosis   Anemia due to chronic kidney disease   Acute HFrEF (heart failure with reduced ejection fraction) ([place])   Jerking motor movements w/ intentional movement Metabolic encephalopathy uncertain cause  Pending BMP, VBG Pharmacy will be asked to eval Rx for side effects --> d/c chlorpromazine  Neurology consulted   Acute respiratory failure with hypoxia ([place]) Acute HFrEF (heart failure with reduced ejection fraction) (HCC) Ddx: fluid overload in setting of HFrEF, possible HCAP Treating presumptive HCAP: but given improvement post-dialysis, pneumonia can be ruled OUT and abx  d/c Treat fluid overload HFrEF given (+)rales and pulmonary edema on CXR, known pleural effusion Improvement post-dialysis Recent echocardiogram, will not repeat I&O Consider CT chest if clinically worsening Continue supplemental O2 as needed - on room air as of 04/12/22   Leukocytosis - resolved, likely reactive Loose stool in setting of  recent hospitalization - resolved Await CDiff screen but this seems unlikely, no further BM and WBC improving  Consider CT chest/abd/pelvis if clinically worsening  BCx NG x2d  CAD (coronary artery disease) NSTEMI treatment but cardiology feels elevated troponins more likely d/t demand ischemia No chest pain  Continue BP control, ASA, Statin, Plavix  troponin increased overnight, repeat again this AM trending up --> consulted cardiology (Dr Humphrey Rolls) plan for heparin gtt 48h, not candidate for intervention at this time  Cardiology following  End stage renal disease ([place]) HD per nephrology team   Essential hypertension Home meds Prn metoprolol   Insulin dependent type 2 diabetes mellitus ([place]) SSI and basal insulin A1C  Hypothyroidism w/ low TSH  holding home levothyroxine  No recent TSH and last 12/2021 was abnormal, will check TSH (given acute illness, would not change dose synthroid unless significantly abnormal) --> TSH 0.200 adding on T3 and Free T4   Mixed hyperlipidemia Continue statin   Dementia (HCC) Continue donepezil   Hx CVA Continue BP control, ASA, Statin, Plavix   Anemia due to chronic kidney disease Trend CBC   DVT prophylaxis: heparin gtt per cardiology --> due to D/C today  Pertinent IV fluids/nutrition: no continuous IV fluids Central lines / invasive devices: dialysis catheter R subclavian   Code Status: FULL CODE confirmed w/ patient and family in the ED on admission by Dr Sheppard Coil   Disposition: inpatient  TOC needs: pending clinical course             Subjective:  Patient reports no concerns, SOB has improved.  Wife and RN report jerking movement as per hospital course  Pt is alert but minimally conversant, wife provides history  Denies CP  Pain controlled.  Tolerating diet but low appetite which is chronic.  Reports no concerns w/ urination/defecation.   Family Communication: spoke w/ wife at bedside on rounds and son on the  phone as well at that time       Objective Findings:  Vitals:   04/13/22 0455 04/13/22 0818 04/13/22 1024 04/13/22 1145  BP:  (!) 146/52 (!) 157/65 (!) 146/49  Pulse:  61 65 61  Resp:  18  14  Temp:  98.7 F (37.1 C)  98.2 F (36.8 C)  TempSrc:      SpO2:  100% 98% 98%  Weight: 63.5 kg     Height:        Intake/Output Summary (Last 24 hours) at 04/13/2022 1425 Last data filed at 04/13/2022 0949 Gross per 24 hour  Intake 298.91 ml  Output --  Net 298.91 ml   Filed Weights   04/11/22 1654 04/12/22 0453 04/13/22 0455  Weight: 64.8 kg 64.4 kg 63.5 kg    Examination:  Constitutional:  VS as above General Appearance: alert, well-developed, well-nourished, NAD Eyes: Normal lids and conjunctive, non-icteric sclera Ears, Nose, Mouth, Throat: Normal external appearance MMM Respiratory: Normal respiratory effort No wheeze No rhonchi + rales seem better today  Cardiovascular: S1/S2 normal No murmur RRR No rub/gallop auscultated No JVD No lower extremity edema Gastrointestinal: No tenderness Musculoskeletal:  No clubbing/cyanosis of digits Symmetrical movement in all extremities Neurological: No cranial nerve deficit on limited exam  Alert Jerking/tonic movements on intentional movement - when asked to grip my hands, his whole arms jerk once, same with leg movements, OT reports similar jerking when trying to sit him up  Psychiatric: Lacks judgment/insight Flat mood and affect       Scheduled Medications:   aspirin EC  81 mg Oral Daily   atorvastatin  40 mg Oral Daily   calcium acetate  1,334 mg Oral TID WC   Chlorhexidine Gluconate Cloth  6 each Topical Q0600   cholecalciferol  5,000 Units Oral Daily   clopidogrel  75 mg Oral Daily   donepezil  10 mg Oral Daily   gabapentin  300 mg Oral QHS   insulin aspart  0-5 Units Subcutaneous QHS   insulin aspart  0-9 Units Subcutaneous TID WC   insulin aspart  2 Units Subcutaneous TID WC   insulin  glargine-yfgn  10 Units Subcutaneous QHS   isosorbide-hydrALAZINE  1 tablet Oral TID   losartan  100 mg Oral QPM   metoprolol succinate  50 mg Oral Daily   multivitamin  1 tablet Oral Daily   pantoprazole  40 mg Oral Daily   patiromer  16.8 g Oral Daily   sevelamer carbonate  2.4 g Oral Q breakfast    Continuous Infusions:  anticoagulant sodium citrate     heparin 950 Units/hr (04/13/22 0949)    PRN Medications:  acetaminophen **OR** acetaminophen, alteplase, anticoagulant sodium citrate, heparin, ipratropium-albuterol, lidocaine (PF), lidocaine-prilocaine, metoprolol tartrate, ondansetron **OR** ondansetron (ZOFRAN) IV, oxyCODONE, pentafluoroprop-tetrafluoroeth  Antimicrobials:  Anti-infectives (From admission, onward)    Start     Dose/Rate Route Frequency Ordered Stop   04/11/22 1800  ceFEPIme (MAXIPIME) 1 g in sodium chloride 0.9 % 100 mL IVPB  Status:  Discontinued        1 g 200 mL/hr over 30 Minutes Intravenous Every 24 hours 04/10/22 1829 04/12/22 1502   04/10/22 1829  vancomycin variable dose per unstable renal function (pharmacist dosing)  Status:  Discontinued         Does not apply See admin instructions 04/10/22 1829 04/11/22 1352   04/10/22 1615  vancomycin (VANCOCIN) IVPB 1000 mg/200 mL premix  Status:  Discontinued        1,000 mg 200 mL/hr over 60 Minutes Intravenous  Once 04/10/22 1600 04/10/22 1614   04/10/22 1615  ceFEPIme (MAXIPIME) 2 g in sodium chloride 0.9 % 100 mL IVPB        2 g 200 mL/hr over 30 Minutes Intravenous  Once 04/10/22 1600 04/10/22 1711   04/10/22 1615  vancomycin (VANCOREADY) IVPB 1250 mg/250 mL        1,250 mg 166.7 mL/hr over 90 Minutes Intravenous  Once 04/10/22 1614 04/10/22 1852           Data Reviewed: I have personally reviewed following labs and imaging studies  CBC: Recent Labs  Lab 04/10/22 1525 04/10/22 1747 04/11/22 0608 04/12/22 0232 04/13/22 0540  WBC 17.3* 13.5* 10.1 7.3 9.6  NEUTROABS 15.6*  --   --   --    --   HGB 10.1* 9.6* 9.1* 9.4* 8.5*  HCT 33.3* 31.3* 29.5* 30.4* 28.3*  MCV 85.2 85.5 84.8 85.2 85.5  PLT 184 181 157 170 527   Basic Metabolic Panel: Recent Labs  Lab 04/10/22 1525 04/10/22 1747 04/11/22 0608 04/13/22 1033  NA 137  --  139 137  K 4.8  --  4.6 5.6*  CL 101  --  103 104  CO2 26  --  27 27  GLUCOSE 332*  --  134* 120*  BUN 23  --  31* 40*  CREATININE 6.31* 6.33* 7.29* 8.68*  CALCIUM 8.9  --  8.9 9.2  MG  --   --   --  2.1   GFR: Estimated Creatinine Clearance: 6.1 mL/min (A) (by C-G formula based on SCr of 8.68 mg/dL (H)). Liver Function Tests: Recent Labs  Lab 04/11/22 0608  AST 16  ALT 10  ALKPHOS 110  BILITOT 1.2  PROT 6.0*  ALBUMIN 2.6*   No results for input(s): "LIPASE", "AMYLASE" in the last 168 hours. No results for input(s): "AMMONIA" in the last 168 hours. Coagulation Profile: Recent Labs  Lab 04/11/22 1144  INR 1.0   Cardiac Enzymes: No results for input(s): "CKTOTAL", "CKMB", "CKMBINDEX", "TROPONINI" in the last 168 hours. BNP (last 3 results) No results for input(s): "PROBNP" in the last 8760 hours. HbA1C: Recent Labs    04/11/22 0608  HGBA1C 5.9*   CBG: Recent Labs  Lab 04/12/22 1623 04/12/22 2025 04/13/22 0301 04/13/22 0819 04/13/22 1226  GLUCAP 98 145* 112* 111* 122*   Lipid Profile: No results for input(s): "CHOL", "HDL", "LDLCALC", "TRIG", "CHOLHDL", "LDLDIRECT" in the last 72 hours. Thyroid Function Tests: Recent Labs    04/11/22 0603 04/11/22 0608  TSH  --  0.200*  FREET4 1.49*  --    Anemia Panel: No results for input(s): "VITAMINB12", "FOLATE", "FERRITIN", "TIBC", "IRON", "RETICCTPCT" in the last 72 hours. Most Recent Urinalysis On File:     Component Value Date/Time   COLORURINE Straw 09/11/2013 1933   APPEARANCEUR Clear 09/11/2013 1933   LABSPEC 1.016 09/11/2013 1933   PHURINE 7.0 09/11/2013 1933   GLUCOSEU >=500 09/11/2013 1933   HGBUR Negative 09/11/2013 1933   BILIRUBINUR Negative 09/11/2013  1933   KETONESUR Negative 09/11/2013 1933   PROTEINUR 100 mg/dL 09/11/2013 1933   NITRITE Negative 09/11/2013 1933   LEUKOCYTESUR Negative 09/11/2013 1933   Sepsis Labs: '@LABRCNTIP'$ (procalcitonin:4,lacticidven:4)  Recent Results (from the past 240 hour(s))  Resp Panel by RT-PCR (Flu A&B, Covid) Anterior Nasal Swab     Status: None   Collection Time: 04/10/22  3:25 PM   Specimen: Anterior Nasal Swab  Result Value Ref Range Status   SARS Coronavirus 2 by RT PCR NEGATIVE NEGATIVE Final    Comment: (NOTE) SARS-CoV-2 target nucleic acids are NOT DETECTED.  The SARS-CoV-2 RNA is generally detectable in upper respiratory specimens during the acute phase of infection. The lowest concentration of SARS-CoV-2 viral copies this assay can detect is 138 copies/mL. A negative result does not preclude SARS-Cov-2 infection and should not be used as the sole basis for treatment or other patient management decisions. A negative result may occur with  improper specimen collection/handling, submission of specimen other than nasopharyngeal swab, presence of viral mutation(s) within the areas targeted by this assay, and inadequate number of viral copies(<138 copies/mL). A negative result must be combined with clinical observations, patient history, and epidemiological information. The expected result is Negative.  Fact Sheet for Patients:  EntrepreneurPulse.com.au  Fact Sheet for Healthcare Providers:  IncredibleEmployment.be  This test is no t yet approved or cleared by the [place] FDA and  has been authorized for detection and/or diagnosis of SARS-CoV-2 by FDA under an Emergency Use Authorization (EUA). This EUA will remain  in effect (meaning this test can be used) for the duration of the COVID-19 declaration under Section 564(b)(1) of the Act, 21 U.S.C.section 360bbb-3(b)(1), unless the authorization is terminated  or revoked  sooner.       Influenza  A by PCR NEGATIVE NEGATIVE Final   Influenza B by PCR NEGATIVE NEGATIVE Final    Comment: (NOTE) The Xpert Xpress SARS-CoV-2/FLU/RSV plus assay is intended as an aid in the diagnosis of influenza from Nasopharyngeal swab specimens and should not be used as a sole basis for treatment. Nasal washings and aspirates are unacceptable for Xpert Xpress SARS-CoV-2/FLU/RSV testing.  Fact Sheet for Patients: EntrepreneurPulse.com.au  Fact Sheet for Healthcare Providers: IncredibleEmployment.be  This test is not yet approved or cleared by the [place] FDA and has been authorized for detection and/or diagnosis of SARS-CoV-2 by FDA under an Emergency Use Authorization (EUA). This EUA will remain in effect (meaning this test can be used) for the duration of the COVID-19 declaration under Section 564(b)(1) of the Act, 21 U.S.C. section 360bbb-3(b)(1), unless the authorization is terminated or revoked.  Performed at [facility], [address] [place], [place] [address]   Blood culture (routine x 2)     Status: None (Preliminary result)   Collection Time: 04/10/22  4:37 PM   Specimen: BLOOD  Result Value Ref Range Status   Specimen Description BLOOD BLOOD LEFT ARM  Final   Special Requests   Final    BOTTLES DRAWN AEROBIC AND ANAEROBIC Blood Culture adequate volume   Culture   Final    NO GROWTH 3 DAYS Performed at [facility], [address] [place], [place] [address]    Report Status PENDING  Incomplete  Blood culture (routine x 2)     Status: None (Preliminary result)   Collection Time: 04/10/22  4:37 PM   Specimen: BLOOD  Result Value Ref Range Status   Specimen Description BLOOD BLOOD LEFT ARM  Final   Special Requests   Final    BOTTLES DRAWN AEROBIC AND ANAEROBIC Blood Culture adequate volume   Culture   Final    NO GROWTH 3 DAYS Performed at [facility], [address], [place], [place] [address]     Report Status PENDING  Incomplete  MRSA Next Gen by PCR, Nasal     Status: None   Collection Time: 04/10/22  6:53 PM   Specimen: Nasal Mucosa; Nasal Swab  Result Value Ref Range Status   MRSA by PCR Next Gen NOT DETECTED NOT DETECTED Final    Comment: (NOTE) The GeneXpert MRSA Assay (FDA approved for NASAL specimens only), is one component of a comprehensive MRSA colonization surveillance program. It is not intended to diagnose MRSA infection nor to guide or monitor treatment for MRSA infections. Test performance is not FDA approved in patients less than 54 years old. Performed at [facility], [place], [place] [address]   C Difficile Quick Screen w PCR reflex     Status: None   Collection Time: 04/12/22 10:00 AM   Specimen: STOOL  Result Value Ref Range Status   C Diff antigen NEGATIVE NEGATIVE Final   C Diff toxin NEGATIVE NEGATIVE Final   C Diff interpretation No C. difficile detected.  Final    Comment: Performed at [facility], [place],  [address]  Respiratory (~20 pathogens) panel by PCR     Status: None   Collection Time: 04/13/22  5:15 AM   Specimen: Nasopharyngeal Swab; Respiratory  Result Value Ref Range Status   Adenovirus NOT DETECTED NOT DETECTED Final   Coronavirus 229E NOT DETECTED NOT DETECTED Final    Comment: (NOTE) The Coronavirus  on the Respiratory Panel, DOES NOT test for the novel  Coronavirus (2019 nCoV)    Coronavirus HKU1 NOT DETECTED NOT DETECTED Final   Coronavirus NL63 NOT DETECTED NOT DETECTED Final   Coronavirus OC43 NOT DETECTED NOT DETECTED Final   Metapneumovirus NOT DETECTED NOT DETECTED Final   Rhinovirus / Enterovirus NOT DETECTED NOT DETECTED Final   Influenza A NOT DETECTED NOT DETECTED Final   Influenza B NOT DETECTED NOT DETECTED Final   Parainfluenza Virus 1 NOT DETECTED NOT DETECTED Final   Parainfluenza Virus 2 NOT DETECTED NOT DETECTED Final   Parainfluenza Virus 3 NOT  DETECTED NOT DETECTED Final   Parainfluenza Virus 4 NOT DETECTED NOT DETECTED Final   Respiratory Syncytial Virus NOT DETECTED NOT DETECTED Final   Bordetella pertussis NOT DETECTED NOT DETECTED Final   Bordetella Parapertussis NOT DETECTED NOT DETECTED Final   Chlamydophila pneumoniae NOT DETECTED NOT DETECTED Final   Mycoplasma pneumoniae NOT DETECTED NOT DETECTED Final    Comment: Performed at [facility], [place] [address] [place], [place] [address]         Radiology Studies: DG Chest Portable 1 View  Result Date: 04/10/2022 CLINICAL DATA:  Shortness of breath EXAM: PORTABLE CHEST 1 VIEW COMPARISON:  03/22/2022 FINDINGS: Right-sided tunneled central venous catheter with the tip in the right atrium. Small right pleural effusion. No pneumothorax. Unchanged cardiac and mediastinal contours. Compared to prior exam there are new linear opacities in the right lung base, which are nonspecific, and could represent atelectasis, but infection could have a similar appearance. Prominent interstitial opacities with mild peribronchial cuffing, could suggest a component of mild pulmonary edema. No displaced rib fractures. There is a left axillary vascular stent in place. IMPRESSION: 1. New patchy opacities in the right lung base are nonspecific, and could represent atelectasis, but infection could have a similar appearance. 2. Trace right pleural effusion. 3. Prominent interstitial opacities with mild peribronchial cuffing, could suggest a component of mild pulmonary edema. Electronically Signed   By: Marin Roberts M.D.   On: 04/10/2022 15:36            LOS: 3 days     Time spent: 50 mins   Emeterio Reeve, DO [facility] 04/13/2022, 2:25 PM    Dictation software may have been used to generate the above note. Typos may occur and escape review in typed/dictated notes. Please contact Dr Sheppard Coil directly for clarity if needed.  Staff may message me via secure chat in [place]   but this may not receive an immediate response,  please page me for urgent matters!  If 7PM-7AM, please contact night coverage [email or web address]

## 2022-04-13 NOTE — Progress Notes (Signed)
Eeg done 

## 2022-04-13 NOTE — Progress Notes (Signed)
[facility]  ROUNDING NOTE   Subjective:   Patient is a 76 year old man of Asian/Indian origin with a past medical history of ESRD, on hemodialysis on Tuesday Thursday Saturday schedule, diabetes mellitus type 2, CVA, hypothyroidism who came to the ER with chief complaint of shortness of breath. He has been admitted for Shortness of breath [R06.02] Hypoxia [R09.02] HCAP (healthcare-associated pneumonia) [J18.9] Hypoxic respiratory failure ([place]) [J96.91]  Patient is known to our practice from previous admissions and receives outpatient dialysis treatments at [facility] on a TTS schedule, supervised by [place] [facility], wife at bedside Wife states he was fine until yesterday afternoon, began to have weakness and tremors. This has progressed overnight to now responding with moans.   Objective:  Vital signs in last 24 hours:  Temp:  [97.7 F (36.5 C)-99.2 F (37.3 C)] 98.2 F (36.8 C) (11/20 1145) Pulse Rate:  [61-78] 61 (11/20 1145) Resp:  [14-18] 14 (11/20 1145) BP: (145-176)/(49-79) 146/49 (11/20 1145) SpO2:  [97 %-100 %] 98 % (11/20 1145) Weight:  [63.5 kg] 63.5 kg (11/20 0455)  Weight change: -2.6 kg Filed Weights   04/11/22 1654 04/12/22 0453 04/13/22 0455  Weight: 64.8 kg 64.4 kg 63.5 kg    Intake/Output: I/O last 3 completed shifts: In: 828.4 [P.O.:720; I.V.:8.4; IV Piggyback:100] Out: -    Intake/Output this shift:  Total I/O In: 298.9 [I.V.:298.9] Out: -   Physical Exam: General: NAD  Head: Normocephalic, atraumatic. Moist oral mucosal membranes  Eyes: Anicteric  Lungs:  Basilar wheeze, normal effort, room air  Heart: Regular rate and rhythm  Abdomen:  Soft, non-tender, nondistended  Extremities: No peripheral edema.  Neurologic: Nonfocal, moving all four extremities  Skin: No lesions  Access: Right chest PermCath    Basic Metabolic Panel: Recent Labs  Lab 04/10/22 1525 04/10/22 1747 04/11/22 0608  04/13/22 1033  NA 137  --  139 137  K 4.8  --  4.6 5.6*  CL 101  --  103 104  CO2 26  --  27 27  GLUCOSE 332*  --  134* 120*  BUN 23  --  31* 40*  CREATININE 6.31* 6.33* 7.29* 8.68*  CALCIUM 8.9  --  8.9 9.2  MG  --   --   --  2.1     Liver Function Tests: Recent Labs  Lab 04/11/22 0608  AST 16  ALT 10  ALKPHOS 110  BILITOT 1.2  PROT 6.0*  ALBUMIN 2.6*    No results for input(s): "LIPASE", "AMYLASE" in the last 168 hours. No results for input(s): "AMMONIA" in the last 168 hours.  CBC: Recent Labs  Lab 04/10/22 1525 04/10/22 1747 04/11/22 0608 04/12/22 0232 04/13/22 0540  WBC 17.3* 13.5* 10.1 7.3 9.6  NEUTROABS 15.6*  --   --   --   --   HGB 10.1* 9.6* 9.1* 9.4* 8.5*  HCT 33.3* 31.3* 29.5* 30.4* 28.3*  MCV 85.2 85.5 84.8 85.2 85.5  PLT 184 181 157 170 193     Cardiac Enzymes: No results for input(s): "CKTOTAL", "CKMB", "CKMBINDEX", "TROPONINI" in the last 168 hours.  BNP: Invalid input(s): "POCBNP"  CBG: Recent Labs  Lab 04/12/22 1213 04/12/22 1623 04/12/22 2025 04/13/22 0301 04/13/22 0819  GLUCAP 196* 98 145* 112* 111*     Microbiology: Results for orders placed or performed during the hospital encounter of 04/10/22  Resp Panel by RT-PCR (Flu A&B, Covid) Anterior Nasal Swab     Status: None   Collection  Time: 04/10/22  3:25 PM   Specimen: Anterior Nasal Swab  Result Value Ref Range Status   SARS Coronavirus 2 by RT PCR NEGATIVE NEGATIVE Final    Comment: (NOTE) SARS-CoV-2 target nucleic acids are NOT DETECTED.  The SARS-CoV-2 RNA is generally detectable in upper respiratory specimens during the acute phase of infection. The lowest concentration of SARS-CoV-2 viral copies this assay can detect is 138 copies/mL. A negative result does not preclude SARS-Cov-2 infection and should not be used as the sole basis for treatment or other patient management decisions. A negative result may occur with  improper specimen collection/handling,  submission of specimen other than nasopharyngeal swab, presence of viral mutation(s) within the areas targeted by this assay, and inadequate number of viral copies(<138 copies/mL). A negative result must be combined with clinical observations, patient history, and epidemiological information. The expected result is Negative.  Fact Sheet for Patients:  EntrepreneurPulse.com.au  Fact Sheet for Healthcare Providers:  IncredibleEmployment.be  This test is no t yet approved or cleared by the [place] FDA and  has been authorized for detection and/or diagnosis of SARS-CoV-2 by FDA under an Emergency Use Authorization (EUA). This EUA will remain  in effect (meaning this test can be used) for the duration of the COVID-19 declaration under Section 564(b)(1) of the Act, 21 U.S.C.section 360bbb-3(b)(1), unless the authorization is terminated  or revoked sooner.       Influenza A by PCR NEGATIVE NEGATIVE Final   Influenza B by PCR NEGATIVE NEGATIVE Final    Comment: (NOTE) The Xpert Xpress SARS-CoV-2/FLU/RSV plus assay is intended as an aid in the diagnosis of influenza from Nasopharyngeal swab specimens and should not be used as a sole basis for treatment. Nasal washings and aspirates are unacceptable for Xpert Xpress SARS-CoV-2/FLU/RSV testing.  Fact Sheet for Patients: EntrepreneurPulse.com.au  Fact Sheet for Healthcare Providers: IncredibleEmployment.be  This test is not yet approved or cleared by the [place] FDA and has been authorized for detection and/or diagnosis of SARS-CoV-2 by FDA under an Emergency Use Authorization (EUA). This EUA will remain in effect (meaning this test can be used) for the duration of the COVID-19 declaration under Section 564(b)(1) of the Act, 21 U.S.C. section 360bbb-3(b)(1), unless the authorization is terminated or revoked.  Performed at [facility], [address] [place], [place] [address]   Blood culture (routine x 2)     Status: None (Preliminary result)   Collection Time: 04/10/22  4:37 PM   Specimen: BLOOD  Result Value Ref Range Status   Specimen Description BLOOD BLOOD LEFT ARM  Final   Special Requests   Final    BOTTLES DRAWN AEROBIC AND ANAEROBIC Blood Culture adequate volume   Culture   Final    NO GROWTH 3 DAYS Performed at [facility], [address] [place], [place] [address]    Report Status PENDING  Incomplete  Blood culture (routine x 2)     Status: None (Preliminary result)   Collection Time: 04/10/22  4:37 PM   Specimen: BLOOD  Result Value Ref Range Status   Specimen Description BLOOD BLOOD LEFT ARM  Final   Special Requests   Final    BOTTLES DRAWN AEROBIC AND ANAEROBIC Blood Culture adequate volume   Culture   Final    NO GROWTH 3 DAYS Performed at [facility], [address] [place], [place] [address]    Report Status PENDING  Incomplete  MRSA Next Gen by PCR, Nasal  Status: None   Collection Time: 04/10/22  6:53 PM   Specimen: Nasal Mucosa; Nasal Swab  Result Value Ref Range Status   MRSA by PCR Next Gen NOT DETECTED NOT DETECTED Final    Comment: (NOTE) The GeneXpert MRSA Assay (FDA approved for NASAL specimens only), is one component of a comprehensive MRSA colonization surveillance program. It is not intended to diagnose MRSA infection nor to guide or monitor treatment for MRSA infections. Test performance is not FDA approved in patients less than 19 years old. Performed at [facility], [place], [place] [address]   C Difficile Quick Screen w PCR reflex     Status: None   Collection Time: 04/12/22 10:00 AM   Specimen: STOOL  Result Value Ref Range Status   C Diff antigen NEGATIVE NEGATIVE Final   C Diff toxin NEGATIVE NEGATIVE Final   C Diff interpretation No C. difficile detected.  Final    Comment: Performed at [facility],  [address] [place], [place] [address]  Respiratory (~20 pathogens) panel by PCR     Status: None   Collection Time: 04/13/22  5:15 AM   Specimen: Nasopharyngeal Swab; Respiratory  Result Value Ref Range Status   Adenovirus NOT DETECTED NOT DETECTED Final   Coronavirus 229E NOT DETECTED NOT DETECTED Final    Comment: (NOTE) The Coronavirus on the Respiratory Panel, DOES NOT test for the novel  Coronavirus (2019 nCoV)    Coronavirus HKU1 NOT DETECTED NOT DETECTED Final   Coronavirus NL63 NOT DETECTED NOT DETECTED Final   Coronavirus OC43 NOT DETECTED NOT DETECTED Final   Metapneumovirus NOT DETECTED NOT DETECTED Final   Rhinovirus / Enterovirus NOT DETECTED NOT DETECTED Final   Influenza A NOT DETECTED NOT DETECTED Final   Influenza B NOT DETECTED NOT DETECTED Final   Parainfluenza Virus 1 NOT DETECTED NOT DETECTED Final   Parainfluenza Virus 2 NOT DETECTED NOT DETECTED Final   Parainfluenza Virus 3 NOT DETECTED NOT DETECTED Final   Parainfluenza Virus 4 NOT DETECTED NOT DETECTED Final   Respiratory Syncytial Virus NOT DETECTED NOT DETECTED Final   Bordetella pertussis NOT DETECTED NOT DETECTED Final   Bordetella Parapertussis NOT DETECTED NOT DETECTED Final   Chlamydophila pneumoniae NOT DETECTED NOT DETECTED Final   Mycoplasma pneumoniae NOT DETECTED NOT DETECTED Final    Comment: Performed at [facility], [place]. [address], [place], [place] [address]    Coagulation Studies: Recent Labs    04/11/22 1144  LABPROT 13.2  INR 1.0      Urinalysis: No results for input(s): "COLORURINE", "LABSPEC", "PHURINE", "GLUCOSEU", "HGBUR", "BILIRUBINUR", "KETONESUR", "PROTEINUR", "UROBILINOGEN", "NITRITE", "LEUKOCYTESUR" in the last 72 hours.  Invalid input(s): "APPERANCEUR"    Imaging: No results found.   Medications:    anticoagulant sodium citrate     heparin 950 Units/hr (04/13/22 0949)    aspirin EC  81 mg Oral Daily   atorvastatin  40 mg Oral Daily   calcium  acetate  1,334 mg Oral TID WC   Chlorhexidine Gluconate Cloth  6 each Topical Q0600   chlorproMAZINE  25 mg Oral TID   cholecalciferol  5,000 Units Oral Daily   clopidogrel  75 mg Oral Daily   insulin aspart  5 Units Intravenous Once   And   dextrose  1 ampule Intravenous Once   donepezil  10 mg Oral Daily   gabapentin  300 mg Oral QHS   insulin aspart  0-5 Units Subcutaneous QHS   insulin  aspart  0-9 Units Subcutaneous TID WC   insulin aspart  2 Units Subcutaneous TID WC   insulin glargine-yfgn  10 Units Subcutaneous QHS   isosorbide-hydrALAZINE  1 tablet Oral TID   losartan  100 mg Oral QPM   metoprolol succinate  50 mg Oral Daily   multivitamin  1 tablet Oral Daily   pantoprazole  40 mg Oral Daily   patiromer  16.8 g Oral Daily   sevelamer carbonate  2.4 g Oral Q breakfast   acetaminophen **OR** acetaminophen, alteplase, anticoagulant sodium citrate, heparin, ipratropium-albuterol, lidocaine (PF), lidocaine-prilocaine, metoprolol tartrate, ondansetron **OR** ondansetron (ZOFRAN) IV, oxyCODONE, pentafluoroprop-tetrafluoroeth  Chest x-ray done on March 09, 2022 IMPRESSION: 1. Cardiomegaly with vascular congestion and pulmonary edema 2. Worsening airspace disease at the right greater than left lung base, atelectasis versus pneumonia       Assessment/ Plan:  [name] is a 76 y.o.  male with a past medical history of hypertension, end-stage renal disease, on hemodialysis on Monday Wednesday Friday schedule, diabetes mellitus, CVA who was brought to the ER with chief complaint of weakness.  [facility]/TTS/right chest PermCath  End-stage renal disease with hyperkalemia on hemodialysis.         Patient is on Tuesday Thursday Saturday schedule as an outpatient. Potassium 5.6 today. Recommend ordering Veltassa 16.'2mg'$  once.  Next treatment scheduled for Tuesday.     2. Anemia of chronic kidney disease  Lab Results  Component Value Date   HGB 8.5 (L) 04/13/2022     Patient receives [place] outpatient.  Hgb below target, monitoring.  3. Secondary Hyperparathyroidism: with outpatient labs: PTH 422, phosphorus 6.4, calcium 9.1 on 12/29/2021.   Lab Results  Component Value Date   CALCIUM 9.2 04/13/2022   CAION 0.83 (LL) 12/10/2020   PHOS 4.1 03/12/2022    Calcium and phosphorus acceptable. Continue calcium acetate with meals.  4.  Hypertension with chronic kidney disease.  Home regimen includes hydralazine losartan, and metoprolol.   Blood pressure 146/49  5. Diabetes mellitus type II with chronic kidney disease insulin dependent. Home regimen includes NovoLog and Levemir. Most recent hemoglobin A1c is 8.1 on 01/18/2022.  Currently prescribed Januvia outpatient.  Currently held.  Well controlled. Sliding scale insuline ordered by primary team.     LOS: 3   11/20/202312:09 PM

## 2022-04-13 NOTE — Progress Notes (Signed)
[facility] NOTE       Patient ID: Joseph Mcpherson MRN: [id number] DOB/AGE: 07/30/1945 76 y.o.  Admit date: 04/10/2022 Referring Physician Dr. Emeterio Reeve  Primary Physician Dr. [name] Primary Cardiologist DR. 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 Cardiology is consulted because of his elevated troponin, felt to be demand ischemia in the setting of hypertensive urgency.  Interval History:  -noted involuntary jerking movements in his upper and lower extremities and rhythmic blinking, VBG and electrolytes pending -no chest pain, shortness of breath, on room air. He says he "feels okay" and denies complaints   Review of systems complete and found to be negative unless listed above     Past Medical History:  Diagnosis Date   Clotting disorder ([place])    per daughter, "when patient travels, his feet swell"   Diabetes mellitus without complication ([place])    type II   Diabetic eye exam ([place]) 12/2019   ESRD (end stage renal disease) ([place])    MWF- [place]   GERD (gastroesophageal reflux disease)    [name] (hard of hearing)    Hyperlipidemia    Hypertension    Hypothyroidism    Thyroid disease     Past Surgical History:  Procedure Laterality Date   AV FISTULA PLACEMENT Right 10/22/2017   Procedure: ARTERIOVENOUS (AV) BRACHIOCEPHALIC FISTULA CREATION RIGHT UPPER ARM;  Surgeon: Conrad Philadelphia, MD;  Location: [place];  Service: Vascular;  Laterality: Right;   AV FISTULA PLACEMENT Left 02/03/2019   Procedure: INSERTION OF ARTERIOVENOUS (AV)  GORE-TEX GRAFT ARM (BRACHIAL AXILLARY );  Surgeon: [name], MD;  Location: [facility];  Service: Vascular;  Laterality: Left;   AV FISTULA PLACEMENT Left 12/10/2020   Procedure: LEFT ARM BRACHIOCEPHALIC ARTERIOVENOUS (AV) FISTULA CREATION;  Surgeon: [name], MD;  Location: [name];  Service: Vascular;  Laterality: Left;   IR CHOLANGIOGRAM EXISTING TUBE  03/18/2022   IR PERC CHOLECYSTOSTOMY  01/21/2022   UPPER EXTREMITY ANGIOGRAPHY Left 09/19/2019   Procedure: UPPER EXTREMITY ANGIOGRAPHY;  Surgeon: [name], MD;  Location: [place] [facility];  Service: Cardiovascular;  Laterality: Left;    Medications Prior to Admission  Medication Sig Dispense Refill Last Dose   albuterol (PROVENTIL) (2.5 MG/3ML) 0.083% nebulizer solution Take 3 mLs (2.5 mg total) by nebulization every 6 (six) hours as needed for wheezing or shortness of breath. 150 mL 1 Past Week   amLODipine (NORVASC) 10 MG tablet Take 1 tablet (10 mg total) by mouth daily. 30 tablet 3 Past Week   aspirin EC 81 MG tablet Take 81 mg by mouth daily. Swallow whole.   Past Week   atorvastatin (LIPITOR) 40 MG tablet Take 1 tablet (40 mg total) by mouth daily. 90 tablet 1 Past Week   B Complex-C-Folic Acid (RENA-VITE RX) 1 MG TABS Take 1 tablet by mouth daily.    Past Week   calcium acetate (PHOSLO) 667 MG capsule TAKE 2 CAPSULES BY MOUTH 3 TIMES A DAY WITH MEAL 540 capsule 1 Past Week   Cholecalciferol (VITAMIN D3) 5000 units TABS Take 5,000 Units by mouth daily.  Past Week   clopidogrel (PLAVIX) 75 MG tablet Take 1 tablet (75 mg total) by mouth daily. 90 tablet 1 Past Week   donepezil (ARICEPT) 10 MG tablet TAKE 1 TABLET BY MOUTH EVERY DAY 90 tablet 3 Past Week   gabapentin (NEURONTIN) 300 MG capsule TAKE 1 CAPSULE BY MOUTH EVERY DAY AT BEDTIME AS DIRECTED 90 capsule 3 Past Week   insulin detemir (LEVEMIR) 100 UNIT/ML injection Inject 0.15 mLs (15 Units total) into the skin at bedtime. 10 mL 11 Past Week   isosorbide  mononitrate (IMDUR) 30 MG 24 hr tablet Take 30 mg by mouth daily.   Past Week   levothyroxine (SYNTHROID) 137 MCG tablet TAKE 1 TABLET BY MOUTH EVERY DAY 90 tablet 3 Past Week   losartan (COZAAR) 100 MG tablet Take 1 tablet (100 mg total) by mouth every evening. 30 tablet 3 Past Week   metoprolol succinate (TOPROL-XL) 50 MG 24 hr tablet TAKE 1 TABLET BY MOUTH EVERY DAY 90 tablet 3 Past Week   pantoprazole (PROTONIX) 40 MG tablet TAKE 1 TABLET BY MOUTH EVERY DAY 90 tablet 3 Past Week   sevelamer carbonate (RENVELA) 2.4 g PACK Take 2.4 g by mouth daily. 90 each 3 Past Week   sitaGLIPtin (JANUVIA) 50 MG tablet Take 50 mg by mouth every evening.    Past Week   BD PEN NEEDLE NANO 2ND GEN 32G X 4 MM MISC USE AS DIRECTED 300 each 1    chlorproMAZINE (THORAZINE) 25 MG tablet TAKE 1 TABLET BY MOUTH THREE TIMES A DAY (Patient not taking: Reported on 04/11/2022) 540 tablet 1 Not Taking   Continuous Blood Gluc Sensor (FREESTYLE LIBRE 14 DAY SENSOR) MISC APPLY EVERY 14 (FOURTEEN) DAYS. 90 each 1    ipratropium-albuterol (DUONEB) 0.5-2.5 (3) MG/3ML SOLN Take 3 mLs by nebulization every 6 (six) hours as needed. 120 mL 4 prn   Multiple Vitamins-Minerals (MULTIVITAMIN WITH MINERALS) tablet Take 1 tablet by mouth daily. (Patient not taking: Reported on 04/11/2022)   Not Taking   predniSONE (DELTASONE) 10 MG tablet '40mg'$  daily x 1 day, '30mg'$  daily x 1 day, '20mg'$  daily x 1 day, '10mg'$  daily x 1 day then stop (Patient not taking: Reported on 04/11/2022) 10 tablet 0 Not Taking   sodium chloride flush (NS) 0.9 % SOLN Inject 5 mLs into the vein daily. Flush cholecystostomy tube daily with 5 ccs of NS 300 mL 0    Social History   Socioeconomic History   Marital status: Married    Spouse name: Not on file   Number of children: Not on file   Years of education: 12   Highest education level: 12th grade  Occupational History   Occupation: Retired  Tobacco Use   Smoking status: Never   Smokeless tobacco: Current    Types:  Snuff  Vaping Use   Vaping Use: Never used  Substance and Sexual Activity   Alcohol use: No   Drug use: No   Sexual activity: Not Currently  Other Topics Concern   Not on file  Social History Narrative   Not on file   Social Determinants of Health   Financial Resource Strain: Low Risk  (06/19/2021)   Overall Financial Resource Strain (CARDIA)    Difficulty of Paying Living Expenses: Not hard at all  Food Insecurity: No Food Insecurity (03/27/2022)   Hunger Vital Sign    Worried About Running Out of Food in the Last Year: Never true    [place] in the  Last Year: Never true  Transportation Needs: No Transportation Needs (03/27/2022)   PRAPARE - Hydrologist (Medical): No    Lack of Transportation (Non-Medical): No  Physical Activity: Sufficiently Active (06/19/2021)   Exercise Vital Sign    Days of Exercise per Week: 7 days    Minutes of Exercise per Session: 30 min  Stress: No Stress Concern Present (06/19/2021)   [place]    Feeling of Stress : Not at all  Social Connections: [place] (06/19/2021)   Social Connection and Isolation Panel [NHANES]    Frequency of Communication with Friends and Family: More than three times a week    Frequency of Social Gatherings with Friends and Family: More than three times a week    Attends Religious Services: More than 4 times per year    Active Member of Clubs or Organizations: Yes    Attends Archivist Meetings: More than 4 times per year    Marital Status: Married  Human resources officer Violence: Not At Risk (03/22/2022)   Humiliation, Afraid, Rape, and Kick questionnaire    Fear of Current or Ex-Partner: No    Emotionally Abused: No    Physically Abused: No    Sexually Abused: No    Family History  Problem Relation Age of Onset   Diabetes Mother    Hyperlipidemia Mother    Hypertension Father      Vitals:    04/13/22 0259 04/13/22 0455 04/13/22 0818 04/13/22 1024  BP: (!) 145/59  (!) 146/52 (!) 157/65  Pulse: 71  61 65  Resp: 18  18   Temp: 98.2 F (36.8 C)  98.7 F (37.1 C)   TempSrc:      SpO2: 99%  100% 98%  Weight:  63.5 kg    Height:        PHYSICAL EXAM General: Pleasant ill-appearing Asian male, in no acute distress.  Sitting upright hospital bed with wife at bedside. HEENT:  Normocephalic and atraumatic.  Somewhat hard of hearing Neck:   No JVD.  Chest: Right chest permacath placed Lungs: Somewhat short of breath appearing on 3 L by nasal cannula, bibasilar crackles  Heart: HRRR . Normal S1 and S2 without gallops or murmurs.  Abdomen: nondistended appearing Msk: Normal strength and tone for age. Extremities: Warm and well perfused. No clubbing, cyanosis.  No peripheral edema.  Neuro: Alert and oriented X 3. Psych:  Answers questions appropriately.   Labs: Basic Metabolic Panel: Recent Labs    04/10/22 1525 04/10/22 1747 04/11/22 0608  NA 137  --  139  K 4.8  --  4.6  CL 101  --  103  CO2 26  --  27  GLUCOSE 332*  --  134*  BUN 23  --  31*  CREATININE 6.31* 6.33* 7.29*  CALCIUM 8.9  --  8.9   Liver Function Tests: Recent Labs    04/11/22 0608  AST 16  ALT 10  ALKPHOS 110  BILITOT 1.2  PROT 6.0*  ALBUMIN 2.6*   No results for input(s): "LIPASE", "AMYLASE" in the last 72 hours. CBC: Recent Labs    04/10/22 1525 04/10/22 1747 04/12/22 0232 04/13/22 0540  WBC 17.3*   < > 7.3 9.6  NEUTROABS 15.6*  --   --   --   HGB 10.1*   < > 9.4* 8.5*  HCT 33.3*   < > 30.4* 28.3*  MCV 85.2   < >  85.2 85.5  PLT 184   < > 170 193   < > = values in this interval not displayed.   Cardiac Enzymes: Recent Labs    04/10/22 1718 04/10/22 2043 04/11/22 0603  TROPONINIHS 98* 310* 529*   BNP: No results for input(s): "BNP" in the last 72 hours. D-Dimer: No results for input(s): "DDIMER" in the last 72 hours. Hemoglobin A1C: Recent Labs    04/11/22 0608   HGBA1C 5.9*   Fasting Lipid Panel: No results for input(s): "CHOL", "HDL", "LDLCALC", "TRIG", "CHOLHDL", "LDLDIRECT" in the last 72 hours. Thyroid Function Tests: Recent Labs    04/11/22 0608  TSH 0.200*   Anemia Panel: No results for input(s): "VITAMINB12", "FOLATE", "FERRITIN", "TIBC", "IRON", "RETICCTPCT" in the last 72 hours.   Radiology: DG Chest Portable 1 View  Result Date: 04/10/2022 CLINICAL DATA:  Shortness of breath EXAM: PORTABLE CHEST 1 VIEW COMPARISON:  03/22/2022 FINDINGS: Right-sided tunneled central venous catheter with the tip in the right atrium. Small right pleural effusion. No pneumothorax. Unchanged cardiac and mediastinal contours. Compared to prior exam there are new linear opacities in the right lung base, which are nonspecific, and could represent atelectasis, but infection could have a similar appearance. Prominent interstitial opacities with mild peribronchial cuffing, could suggest a component of mild pulmonary edema. No displaced rib fractures. There is a left axillary vascular stent in place. IMPRESSION: 1. New patchy opacities in the right lung base are nonspecific, and could represent atelectasis, but infection could have a similar appearance. 2. Trace right pleural effusion. 3. Prominent interstitial opacities with mild peribronchial cuffing, could suggest a component of mild pulmonary edema. Electronically Signed   By: Marin Roberts M.D.   On: 04/10/2022 15:36   CT CHEST WO CONTRAST  Result Date: 03/22/2022 CLINICAL DATA:  Pneumonia. EXAM: CT CHEST WITHOUT CONTRAST TECHNIQUE: Multidetector CT imaging of the chest was performed following the standard protocol without IV contrast. RADIATION DOSE REDUCTION: This exam was performed according to the departmental dose-optimization program which includes automated exposure control, adjustment of the mA and/or kV according to patient size and/or use of iterative reconstruction technique. COMPARISON:  Two-view chest  x-ray 03/22/2022. FINDINGS: Cardiovascular: Is mildly enlarged. Dense coronary artery calcifications are present. Atherosclerotic changes are present at the aortic arch and great vessel origins without definite stenosis. A right IJ dialysis catheter terminates in the right atrium. Calcifications are present at the aortic valve. No significant pericardial effusion is present. Mediastinum/Nodes: Paratracheal lymph nodes measure up to 11 mm in short access. No significant hilar or axillary adenopathy is present. Lungs/Pleura: Moderate bilateral pleural effusions are present. Dependent atelectasis is associated. Additional peribronchial ground-glass attenuation is present in the lingula and to a lesser extent non dependent left lower lobe. More subtle airspace opacities are present in the right upper lobe. Minimal consolidation is present in the right middle lobe. Airways are patent. Upper Abdomen: Cholecystostomy tube noted. Atherosclerotic changes are present. Upper abdomen is otherwise unremarkable. Musculoskeletal: No acute or focal osseous abnormalities are present. IMPRESSION: 1. Moderate bilateral pleural effusions with associated dependent atelectasis. 2. Peribronchial ground-glass attenuation in the lingula and to a lesser extent non dependent left lower lobe. More subtle airspace opacities are present in the right upper lobe. Findings are concerning for multifocal pneumonia. 3. Cholecystostomy tube noted. 4. Coronary artery disease. 5. Right IJ dialysis catheter terminates in the right atrium. 6.  Aortic Atherosclerosis (ICD10-I70.0). Electronically Signed   By: San Morelle M.D.   On: 03/22/2022 15:08   DG  Chest 2 View  Result Date: 03/22/2022 CLINICAL DATA:  Shortness of breath EXAM: CHEST - 2 VIEW COMPARISON:  Chest radiograph dated 03/09/2022. FINDINGS: The heart size and mediastinal contours are within normal limits. Vascular calcifications are seen in the aortic arch. There is a small left  pleural effusion with associated atelectasis. The right lung is clear and there is no right pleural effusion. No pneumothorax on either side. A right internal jugular central venous catheter tip overlies the right atrium. A vascular stent overlies the left axilla. A pigtail catheter overlies the gallbladder fossa. Degenerative changes are seen in the spine. IMPRESSION: 1. Small left pleural effusion with associated atelectasis. 2. Right internal jugular central venous catheter tip overlies the right atrium. Electronically Signed   By: Zerita Boers M.D.   On: 03/22/2022 11:56   IR CHOLANGIOGRAM EXISTING TUBE  Result Date: 03/18/2022 INDICATION: 76 year old with history of acalculous cholecystitis. Cholecystostomy tube was placed on 01/21/2022. Patient is being evaluated for cholecystectomy and request for a cholangiogram. EXAM: CHOLANGIOGRAM THROUGH EXISTING CATHETER MEDICATIONS: None ANESTHESIA/SEDATION: None FLUOROSCOPY: Radiation Exposure Index (as provided by the fluoroscopic device): 5.9 mGy Kerma COMPLICATIONS: None immediate. PROCEDURE: Patient was placed supine on the interventional table. 7 mL Omnipaque 300 was injected through the cholecystostomy tube. The tube was then flushed with normal saline. By patient's request, we attached the tube back to a gravity bag. Cholecystostomy tube was redressed. Fluoroscopic images were taken and saved for this procedure. FINDINGS: Pigtail portion of the drain is positioned in the gallbladder fundus. Radiopaque marker is probably external to the gallbladder. The gallbladder is decompressed. There is immediate opacification of the cystic duct and common bile duct. Contrast rapidly drains into the duodenum. No large filling defects or obstructing stones. IMPRESSION: 1. Cholecystostomy tube is slightly retracted but still appropriately positioned within the gallbladder. 2. Gallbladder is decompressed. Cystic duct and common bile duct are widely patent. Discussed a  capping trial with the patient and wife but they preferred to continue with gravity drainage. Patient is scheduled to follow-up with general surgery. Electronically Signed   By: Markus Daft M.D.   On: 03/18/2022 13:12    ECHO EF 40-45% previously 60-65% 04/2019) with moderate hypokinesis of the LV mid apical inferoseptal wall, apical segment and anteroseptal segment    TELEMETRY reviewed by me (LT) 04/13/2022 : Sinus brady to sinus rhythm rate 54-70s  EKG reviewed by me: NSR lateral TWI, similar to prior from 10/29 and resolution of inf TWI  Data reviewed by me (LT) 04/13/2022: Admission H&P, hospitalst progress note, cbc, bmp, head CT, troponins, EKG, telemetry    Principal Problem:   Hypoxic respiratory failure (HCC) Active Problems:   Essential hypertension   Mixed hyperlipidemia   Insulin dependent type 2 diabetes mellitus ([place])   Hypothyroidism   End stage renal disease ([place])   Acute respiratory failure with hypoxia (HCC)   Dementia (HCC)   CAD (coronary artery disease)   Leukocytosis   Anemia due to chronic kidney disease   Acute HFrEF (heart failure with reduced ejection fraction) ([place])    ASSESSMENT AND PLAN:  Joseph Mcpherson is a 14yoM with a PMH of 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 Cardiology is consulted because of his elevated  troponin, felt to be demand ischemia in the setting of hypertensive urgency.  #acute hypoxic respiratory failure #acute on chronic HFrEF #hypertensive urgency #ESRD (T, TH,S HD) -s/p regular scheduled dialysis on Saturday, next treatment planned for Tuesday 11/21.  -PNA ruled out, he received empiric antibiotics on  admission.  -continue GDMT with metoprolol XL '50mg'$ , Bi-dil 20-37.5 TID, and losartan '100mg'$  daily. Possibly uptitrate Bi-dil if his BP remains elevated -no further cardiac diagnostics necessary   #jerking movements -neuro consulted by primary team, query medication SE. Thorazide discontinued  #CAD with recent NSTEMI #elevated troponin EKG on admission with lateral TWI, similar to prior from 10/29 and resolution of inf TWI. Troponins [id number]. In the absence of chest pain or new ischemic EKG changes, this is most consistent with demand/supply mismatch and not ACS. -continue ASA & plavix, completed 48 hours of heparin -continue atorvastatin -LHC planned with Dr. Saralyn Pilar outpatient on 10/29  This patient's plan of care was discussed and created with Dr. Nehemiah Massed and he is in agreement.  Signed: Tristan Schroeder , PA-C 04/13/2022, 10:48 AM [facility]

## 2022-04-14 ENCOUNTER — Inpatient Hospital Stay: Payer: Medicare HMO

## 2022-04-14 DIAGNOSIS — N186 End stage renal disease: Secondary | ICD-10-CM | POA: Diagnosis not present

## 2022-04-14 DIAGNOSIS — G253 Myoclonus: Secondary | ICD-10-CM | POA: Diagnosis not present

## 2022-04-14 DIAGNOSIS — E039 Hypothyroidism, unspecified: Secondary | ICD-10-CM | POA: Diagnosis not present

## 2022-04-14 DIAGNOSIS — R9401 Abnormal electroencephalogram [EEG]: Secondary | ICD-10-CM | POA: Diagnosis not present

## 2022-04-14 LAB — CBC WITH DIFFERENTIAL/PLATELET
Abs Immature Granulocytes: 0.03 10*3/uL (ref 0.00–0.07)
Basophils Absolute: 0.1 10*3/uL (ref 0.0–0.1)
Basophils Relative: 1 %
Eosinophils Absolute: 0.2 10*3/uL (ref 0.0–0.5)
Eosinophils Relative: 3 %
HCT: 29.8 % — ABNORMAL LOW (ref 39.0–52.0)
Hemoglobin: 9 g/dL — ABNORMAL LOW (ref 13.0–17.0)
Immature Granulocytes: 0 %
Lymphocytes Relative: 20 %
Lymphs Abs: 1.4 10*3/uL (ref 0.7–4.0)
MCH: 25.8 pg — ABNORMAL LOW (ref 26.0–34.0)
MCHC: 30.2 g/dL (ref 30.0–36.0)
MCV: 85.4 fL (ref 80.0–100.0)
Monocytes Absolute: 0.6 10*3/uL (ref 0.1–1.0)
Monocytes Relative: 8 %
Neutro Abs: 4.7 10*3/uL (ref 1.7–7.7)
Neutrophils Relative %: 68 %
Platelets: 223 10*3/uL (ref 150–400)
RBC: 3.49 MIL/uL — ABNORMAL LOW (ref 4.22–5.81)
RDW: 17.1 % — ABNORMAL HIGH (ref 11.5–15.5)
WBC: 6.9 10*3/uL (ref 4.0–10.5)
nRBC: 0 % (ref 0.0–0.2)

## 2022-04-14 LAB — BASIC METABOLIC PANEL
Anion gap: 8 (ref 5–15)
BUN: 18 mg/dL (ref 8–23)
CO2: 31 mmol/L (ref 22–32)
Calcium: 9.1 mg/dL (ref 8.9–10.3)
Chloride: 97 mmol/L — ABNORMAL LOW (ref 98–111)
Creatinine, Ser: 4.78 mg/dL — ABNORMAL HIGH (ref 0.61–1.24)
GFR, Estimated: 12 mL/min — ABNORMAL LOW (ref >60)
Glucose, Bld: 130 mg/dL — ABNORMAL HIGH (ref 70–99)
Potassium: 4.2 mmol/L (ref 3.5–5.1)
Sodium: 136 mmol/L (ref 135–145)

## 2022-04-14 LAB — RENAL FUNCTION PANEL
Albumin: 2.5 g/dL — ABNORMAL LOW (ref 3.5–5.0)
Anion gap: 8 (ref 5–15)
BUN: 47 mg/dL — ABNORMAL HIGH (ref 8–23)
CO2: 24 mmol/L (ref 22–32)
Calcium: 9.4 mg/dL (ref 8.9–10.3)
Chloride: 105 mmol/L (ref 98–111)
Creatinine, Ser: 9.86 mg/dL — ABNORMAL HIGH (ref 0.61–1.24)
GFR, Estimated: 5 mL/min — ABNORMAL LOW (ref >60)
Glucose, Bld: 91 mg/dL (ref 70–99)
Phosphorus: 2.6 mg/dL (ref 2.5–4.6)
Potassium: 6.2 mmol/L — ABNORMAL HIGH (ref 3.5–5.1)
Sodium: 137 mmol/L (ref 135–145)

## 2022-04-14 LAB — GLUCOSE, CAPILLARY
Glucose-Capillary: 93 mg/dL (ref 70–99)
Glucose-Capillary: 101 mg/dL — ABNORMAL HIGH (ref 70–99)
Glucose-Capillary: 106 mg/dL — ABNORMAL HIGH (ref 70–99)
Glucose-Capillary: 94 mg/dL (ref 70–99)
Glucose-Capillary: 139 mg/dL — ABNORMAL HIGH (ref 70–99)
Glucose-Capillary: 138 mg/dL — ABNORMAL HIGH (ref 70–99)

## 2022-04-14 MED ORDER — HYDRALAZINE HCL 20 MG/ML IJ SOLN: 10.0000 mg | Freq: Four times a day (QID) | INTRAMUSCULAR | Status: DC | PRN

## 2022-04-14 MED ORDER — METOPROLOL SUCCINATE ER 100 MG PO TB24
100.0000 mg | ORAL_TABLET | Freq: Every day | ORAL | Status: DC
  Administered 2022-04-15 – 2022-04-16 (×2): 100 mg via ORAL
  Filled 2022-04-14 (×2): qty 1

## 2022-04-14 MED ORDER — SENNOSIDES-DOCUSATE SODIUM 8.6-50 MG PO TABS
2.0000 | ORAL_TABLET | Freq: Two times a day (BID) | ORAL | Status: AC
  Administered 2022-04-14 – 2022-04-15 (×2): 2 via ORAL
  Filled 2022-04-14 (×2): qty 2

## 2022-04-14 MED ORDER — AMLODIPINE BESYLATE 10 MG PO TABS
10.0000 mg | ORAL_TABLET | Freq: Every day | ORAL | Status: DC
  Administered 2022-04-14 – 2022-04-16 (×3): 10 mg via ORAL
  Filled 2022-04-14 (×3): qty 1

## 2022-04-14 NOTE — Progress Notes (Signed)
OT Cancellation Note  Patient Details Name: Joseph Mcpherson MRN: [id number] DOB: 10/05/1945   Cancelled Treatment:    Reason Eval/Treat Not Completed: Medical issues which prohibited therapy;Patient at procedure or test/ unavailable;Patient not medically ready Pt off unit for HD,noted elevated BP > 180s SBP, K+ 6.2. Pt also now has orders for trasnfer to [facility] due to acute neurological changes. WIll signoff and await new orders once workup is complete.   Shara Blazing, M.S., OTR/L 04/14/22, 9:21 AM

## 2022-04-14 NOTE — Progress Notes (Signed)
PROGRESS NOTE    Junior Huezo   YSA:[id number] DOB: 05-Apr-1946  DOA: 04/10/2022 Date of Service: 04/14/22 PCP: [name], MD     Brief Narrative / Hospital Course:  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 Recently hospitalized w/ COVID pneumonia 03/22/22-03/26/22, and for NSTEMI 03/09/22-03/13/22.   HPI: Pt brought to ED from home via EMS for SOB. Felt okay yesterday and this morning was feeling very tired and easily worn out / short winded w/ minimal activity. Wife describes associated sweating but no chest pain or fever, no cough. SHe states he had one runny BM this morning, nonbloody, no persistent diarrhea. He continued to feel poorly thoguth home PT today. Family called EMS. Per EMS hypertensive w/ BP 212/110, SpO2 92% RA and dropped to 89% w/ minimal activity, hyperglycemic.   Hospital course:  11/17: in ED, VS hypoxic on RA down to 88-89%, not on home O2, improved on [place] 2L. Afebrile. WBC elevated to 17.0, but not meeting sepsis criteria. Hypertensive. CXR concerning for pulmonary edema vs pneumonia. BNP elevated to 4110. Cr c/w known ESRD. Hgb baseline at 10.1. Troponin c/w ESRD. Hyperglycemic. EDP started Tx for HCAP (given recent admission to hospital) w/ cefepime and vancomycin. Hospitalist consulted --> admitted to inpatient for acute hypoxic respiratory failure d/t HFrEF w/ volume overload versus/in addition to HCAP.  11/18: tropes up to 500+, cardiology consulted (Dr. Ronny Flurry) and started heparin gtt x48 hours. TSH low, added T3/T4. Procal and MRSA no concerns --> deescalate abx to d/c vanc. HD today. BP better today. Curbside w/ pulmonary (Dr. Lanney Gins) - agree w/ consider CT to reeval pleural effusions if no improvement after dialysis. In HD removed 1000 mL.  11/19: remains  on 2L O2 w/ SpO2 99-100% on this. Remains on heparin gtt 11/20: Wife reports starting last night he had some jerking type movements, I and night coverage were not alerted. On eval this AM, he does have some jerking/clonic type movement w/ intentional movement such as repositioning / squeezing my hands but RN reports he is still when asleep. ABG unable to be obtained, VBG ordered along w/ BMP and Mg - K up some, spoke w/ nephro Dr Holley Raring, ordered Lazarus Salines and insulin/dextrose, will repeat levels later today. VBG no significant concerns. CT head findings normal for age. Spoke w/ neurology Dr Quinn Axe who will see him this afternoon --> Concern for myoclonus, obtained EEG which was abnormal. Started on [place]. Dr Quinn Axe recommends transfer to [facility] for continuous EEG. Dr Annice Pih is on overnight at [facility] and will get pt on EEG, w/ Dr Leonides Schanz, Leonel Ramsay, or Hortense Ramal to see him in the AM. I called hospitalist Dr Myna Hidalgo and will accept.   Consultants:  Nephrology - maintain dialysis  Cardiology - elevated troponins Neurology - myoclonus   Procedures: EEG 04/13/22 which was abnormal       ASSESSMENT & PLAN:   Principal Problem:   Seizure ([place]) Active Problems:   Acute respiratory failure with hypoxia (HCC)   End stage renal disease (HCC)   Essential hypertension   Insulin dependent type 2 diabetes mellitus ([place])   Hypothyroidism   Mixed hyperlipidemia   Dementia (HCC)   CAD (coronary artery disease)   Leukocytosis   Anemia due to chronic kidney disease   Acute HFrEF (heart  failure with reduced ejection fraction) (HCC)   Hypoxic respiratory failure (HCC)   Myoclonus   Abnormal EEG   Seizure w/ myoclonus and abnormal EEG Metabolic encephalopathy Pharmacy was asked to eval Rx for side effects --> d/c chlorpromazine and gabapentin CT head no concerns Neurology consulted --> abnormal EEG starting Keppra  transfer for continuous EEG  Acute respiratory failure with hypoxia  (HCC) Acute HFrEF (heart failure with reduced ejection fraction) (HCC) Ddx: fluid overload in setting of HFrEF, possible HCAP Initially treating presumptive HCAP: but given improvement post-dialysis, pneumonia can be ruled OUT and abx d/c Treat fluid overload HFrEF given (+)rales and pulmonary edema on CXR, known pleural effusion Improvement post-dialysis Recent echocardiogram, will not repeat I&O Consider CT chest if clinically worsening Continue supplemental O2 as needed - on room air as of 04/12/22   Leukocytosis - resolved, likely reactive Loose stool in setting of recent hospitalization - resolved Await CDiff screen but this seems unlikely, no further BM and WBC improving  Consider CT chest/abd/pelvis if clinically worsening  BCx NG x2d  CAD (coronary artery disease) NSTEMI treatment but cardiology feels elevated troponins more likely d/t demand ischemia No chest pain  Continue BP control, ASA, Statin, Plavix  troponin increased overnight, repeat again this AM trending up --> consulted cardiology (Dr Humphrey Rolls) plan for heparin gtt 48h, not candidate for intervention at this time  Cardiology following  End stage renal disease ([place]) HD per nephrology team   Essential hypertension Home meds Prn metoprolol   Insulin dependent type 2 diabetes mellitus ([place]) SSI and basal insulin A1C  Hypothyroidism w/ low TSH  holding home levothyroxine  No recent TSH and last 12/2021 was abnormal, will check TSH (given acute illness, would not change dose synthroid unless significantly abnormal) --> TSH 0.200 adding on T3 and Free T4   Mixed hyperlipidemia Continue statin   Dementia ([place]) Continue donepezil   Hx CVA Continue BP control, ASA, Statin, Plavix   Anemia due to chronic kidney disease Trend CBC   DVT prophylaxis: heparin gtt per cardiology --> D/C today  Pertinent IV fluids/nutrition: no continuous IV fluids Central lines / invasive devices: dialysis catheter R subclavian    Code Status: FULL CODE confirmed w/ patient and family in the ED on admission by Dr Sheppard Coil   Disposition: inpatient  TOC needs: pending clinical course             Subjective:  Patient reports no concerns, he is examined in dialysis suite and is resting but can be awoken easily. Denies CP/SOB.  Pain controlled.   Family Communication: Dr Quinn Axe had extensive discussion w/ family today, will update them further as needed     Objective Findings:  Vitals:   04/14/22 1206 04/14/22 1211 04/14/22 1238 04/14/22 1353  BP: (!) 152/87  (!) 206/85 (!) 190/71  Pulse: 74 81 84 80  Resp: '18 15 19   '$ Temp:   98 F (36.7 C)   TempSrc:   Oral   SpO2: 100% 100% 100%   Weight:  62.6 kg    Height:        Intake/Output Summary (Last 24 hours) at 04/14/2022 1424 Last data filed at 04/14/2022 1200 Gross per 24 hour  Intake 82.12 ml  Output 1000 ml  Net -917.88 ml   Filed Weights   04/13/22 0455 04/14/22 0800 04/14/22 1211  Weight: 63.5 kg 64.4 kg 62.6 kg    Examination:  Constitutional:  VS as above General Appearance: alert, frail, NAD Respiratory: Normal respiratory  effort No wheeze No rhonchi No rales Cardiovascular: S1/S2 normal RRR No lower extremity edema Gastrointestinal: No tenderness No masses No hernia appreciated Musculoskeletal:  No clubbing/cyanosis of digits Symmetrical movement in all extremities Neurological: No cranial nerve deficit on limited exam Alert Psychiatric: Normal judgment/insight Normal mood and affect       Scheduled Medications:   aspirin EC  81 mg Oral Daily   atorvastatin  40 mg Oral Daily   calcium acetate  1,334 mg Oral TID WC   Chlorhexidine Gluconate Cloth  6 each Topical Q0600   cholecalciferol  5,000 Units Oral Daily   clopidogrel  75 mg Oral Daily   donepezil  10 mg Oral Daily   insulin aspart  0-5 Units Subcutaneous QHS   insulin aspart  0-9 Units Subcutaneous TID WC   insulin aspart  2 Units  Subcutaneous TID WC   insulin glargine-yfgn  10 Units Subcutaneous QHS   isosorbide-hydrALAZINE  1 tablet Oral TID   losartan  100 mg Oral QPM   metoprolol succinate  50 mg Oral Daily   multivitamin  1 tablet Oral Daily   pantoprazole  40 mg Oral Daily   patiromer  16.8 g Oral Daily   sevelamer carbonate  2.4 g Oral Q breakfast    Continuous Infusions:  anticoagulant sodium citrate     levETIRAcetam 500 mg (04/14/22 0759)   And   levETIRAcetam      PRN Medications:  acetaminophen **OR** acetaminophen, alteplase, anticoagulant sodium citrate, heparin, ipratropium-albuterol, lidocaine (PF), lidocaine-prilocaine, LORazepam, metoprolol tartrate, ondansetron **OR** ondansetron (ZOFRAN) IV, oxyCODONE, pentafluoroprop-tetrafluoroeth  Antimicrobials:  Anti-infectives (From admission, onward)    Start     Dose/Rate Route Frequency Ordered Stop   04/11/22 1800  ceFEPIme (MAXIPIME) 1 g in sodium chloride 0.9 % 100 mL IVPB  Status:  Discontinued        1 g 200 mL/hr over 30 Minutes Intravenous Every 24 hours 04/10/22 1829 04/12/22 1502   04/10/22 1829  vancomycin variable dose per unstable renal function (pharmacist dosing)  Status:  Discontinued         Does not apply See admin instructions 04/10/22 1829 04/11/22 1352   04/10/22 1615  vancomycin (VANCOCIN) IVPB 1000 mg/200 mL premix  Status:  Discontinued        1,000 mg 200 mL/hr over 60 Minutes Intravenous  Once 04/10/22 1600 04/10/22 1614   04/10/22 1615  ceFEPIme (MAXIPIME) 2 g in sodium chloride 0.9 % 100 mL IVPB        2 g 200 mL/hr over 30 Minutes Intravenous  Once 04/10/22 1600 04/10/22 1711   04/10/22 1615  vancomycin (VANCOREADY) IVPB 1250 mg/250 mL        1,250 mg 166.7 mL/hr over 90 Minutes Intravenous  Once 04/10/22 1614 04/10/22 1852           Data Reviewed: I have personally reviewed following labs and imaging studies  CBC: Recent Labs  Lab 04/10/22 1525 04/10/22 1747 04/11/22 0608 04/12/22 0232  04/13/22 0540 04/14/22 0526  WBC 17.3* 13.5* 10.1 7.3 9.6 6.9  NEUTROABS 15.6*  --   --   --   --  4.7  HGB 10.1* 9.6* 9.1* 9.4* 8.5* 9.0*  HCT 33.3* 31.3* 29.5* 30.4* 28.3* 29.8*  MCV 85.2 85.5 84.8 85.2 85.5 85.4  PLT 184 181 157 170 193 314   Basic Metabolic Panel: Recent Labs  Lab 04/10/22 1525 04/10/22 1747 04/11/22 0608 04/13/22 1033 04/13/22 1818 04/14/22 0526  NA 137  --  139 137 136 137  K 4.8  --  4.6 5.6* 5.6* 6.2*  CL 101  --  103 104 102 105  CO2 26  --  '27 27 23 24  '$ GLUCOSE 332*  --  134* 120* 113* 91  BUN 23  --  31* 40* 44* 47*  CREATININE 6.31* 6.33* 7.29* 8.68* 9.27* 9.86*  CALCIUM 8.9  --  8.9 9.2 9.1 9.4  MG  --   --   --  2.1  --   --   PHOS  --   --   --   --   --  2.6   GFR: Estimated Creatinine Clearance: 5.3 mL/min (A) (by C-G formula based on SCr of 9.86 mg/dL (H)). Liver Function Tests: Recent Labs  Lab 04/11/22 0608 04/13/22 2139 04/14/22 0526  AST 16 14*  --   ALT 10 8  --   ALKPHOS 110 103  --   BILITOT 1.2 0.8  --   PROT 6.0* 5.8*  --   ALBUMIN 2.6* 2.4* 2.5*   No results for input(s): "LIPASE", "AMYLASE" in the last 168 hours. Recent Labs  Lab 04/13/22 2139  AMMONIA 29   Coagulation Profile: Recent Labs  Lab 04/11/22 1144  INR 1.0   Cardiac Enzymes: No results for input(s): "CKTOTAL", "CKMB", "CKMBINDEX", "TROPONINI" in the last 168 hours. BNP (last 3 results) No results for input(s): "PROBNP" in the last 8760 hours. HbA1C: No results for input(s): "HGBA1C" in the last 72 hours. CBG: Recent Labs  Lab 04/13/22 2141 04/14/22 0301 04/14/22 0737 04/14/22 0832 04/14/22 1249  GLUCAP 119* 93 101* 106* 94   Lipid Profile: No results for input(s): "CHOL", "HDL", "LDLCALC", "TRIG", "CHOLHDL", "LDLDIRECT" in the last 72 hours. Thyroid Function Tests: No results for input(s): "TSH", "T4TOTAL", "FREET4", "T3FREE", "THYROIDAB" in the last 72 hours. Anemia Panel: No results for input(s): "VITAMINB12", "FOLATE", "FERRITIN",  "TIBC", "IRON", "RETICCTPCT" in the last 72 hours. Most Recent Urinalysis On File:     Component Value Date/Time   COLORURINE Straw 09/11/2013 1933   APPEARANCEUR Clear 09/11/2013 1933   LABSPEC 1.016 09/11/2013 1933   PHURINE 7.0 09/11/2013 1933   GLUCOSEU >=500 09/11/2013 1933   HGBUR Negative 09/11/2013 1933   BILIRUBINUR Negative 09/11/2013 1933   KETONESUR Negative 09/11/2013 1933   PROTEINUR 100 mg/dL 09/11/2013 1933   NITRITE Negative 09/11/2013 1933   LEUKOCYTESUR Negative 09/11/2013 1933   Sepsis Labs: '@LABRCNTIP'$ (procalcitonin:4,lacticidven:4)  Recent Results (from the past 240 hour(s))  Resp Panel by RT-PCR (Flu A&B, Covid) Anterior Nasal Swab     Status: None   Collection Time: 04/10/22  3:25 PM   Specimen: Anterior Nasal Swab  Result Value Ref Range Status   SARS Coronavirus 2 by RT PCR NEGATIVE NEGATIVE Final    Comment: (NOTE) SARS-CoV-2 target nucleic acids are NOT DETECTED.  The SARS-CoV-2 RNA is generally detectable in upper respiratory specimens during the acute phase of infection. The lowest concentration of SARS-CoV-2 viral copies this assay can detect is 138 copies/mL. A negative result does not preclude SARS-Cov-2 infection and should not be used as the sole basis for treatment or other patient management decisions. A negative result may occur with  improper specimen collection/handling, submission of specimen other than nasopharyngeal swab, presence of viral mutation(s) within the areas targeted by this assay, and inadequate number of viral copies(<138 copies/mL). A negative result must be combined with clinical observations, patient history, and epidemiological information. The expected result is Negative.  Fact Sheet for Patients:  EntrepreneurPulse.com.au  Fact Sheet for Healthcare Providers:  IncredibleEmployment.be  This test is no t yet approved or cleared by the [place] FDA and  has been authorized  for detection and/or diagnosis of SARS-CoV-2 by FDA under an Emergency Use Authorization (EUA). This EUA will remain  in effect (meaning this test can be used) for the duration of the COVID-19 declaration under Section 564(b)(1) of the Act, 21 U.S.C.section 360bbb-3(b)(1), unless the authorization is terminated  or revoked sooner.       Influenza A by PCR NEGATIVE NEGATIVE Final   Influenza B by PCR NEGATIVE NEGATIVE Final    Comment: (NOTE) The Xpert Xpress SARS-CoV-2/FLU/RSV plus assay is intended as an aid in the diagnosis of influenza from Nasopharyngeal swab specimens and should not be used as a sole basis for treatment. Nasal washings and aspirates are unacceptable for Xpert Xpress SARS-CoV-2/FLU/RSV testing.  Fact Sheet for Patients: EntrepreneurPulse.com.au  Fact Sheet for Healthcare Providers: IncredibleEmployment.be  This test is not yet approved or cleared by the [place] FDA and has been authorized for detection and/or diagnosis of SARS-CoV-2 by FDA under an Emergency Use Authorization (EUA). This EUA will remain in effect (meaning this test can be used) for the duration of the COVID-19 declaration under Section 564(b)(1) of the Act, 21 U.S.C. section 360bbb-3(b)(1), unless the authorization is terminated or revoked.  Performed at [facility], [address] [place], [place] [address]   Blood culture (routine x 2)     Status: None (Preliminary result)   Collection Time: 04/10/22  4:37 PM   Specimen: BLOOD  Result Value Ref Range Status   Specimen Description BLOOD BLOOD LEFT ARM  Final   Special Requests   Final    BOTTLES DRAWN AEROBIC AND ANAEROBIC Blood Culture adequate volume   Culture   Final    NO GROWTH 4 DAYS Performed at [facility], [address], [place], [place] [address]    Report Status PENDING  Incomplete  Blood culture (routine x 2)     Status: None (Preliminary result)    Collection Time: 04/10/22  4:37 PM   Specimen: BLOOD  Result Value Ref Range Status   Specimen Description BLOOD BLOOD LEFT ARM  Final   Special Requests   Final    BOTTLES DRAWN AEROBIC AND ANAEROBIC Blood Culture adequate volume   Culture   Final    NO GROWTH 4 DAYS Performed at [facility], [address], [place], [place] [address]    Report Status PENDING  Incomplete  MRSA Next Gen by PCR, Nasal     Status: None   Collection Time: 04/10/22  6:53 PM   Specimen: Nasal Mucosa; Nasal Swab  Result Value Ref Range Status   MRSA by PCR Next Gen NOT DETECTED NOT DETECTED Final    Comment: (NOTE) The GeneXpert MRSA Assay (FDA approved for NASAL specimens only), is one component of a comprehensive MRSA colonization surveillance program. It is not intended to diagnose MRSA infection nor to guide or monitor treatment for MRSA infections. Test performance is not FDA approved in patients less than 36 years old. Performed at [facility], [place], [place] [address]   C Difficile Quick Screen w PCR reflex     Status: None   Collection Time: 04/12/22 10:00 AM   Specimen: STOOL  Result Value Ref Range Status   C Diff antigen NEGATIVE NEGATIVE Final   C Diff toxin NEGATIVE NEGATIVE Final   C Diff  interpretation No C. difficile detected.  Final    Comment: Performed at [facility], [address] [place], [place] [address]  Respiratory (~20 pathogens) panel by PCR     Status: None   Collection Time: 04/13/22  5:15 AM   Specimen: Nasopharyngeal Swab; Respiratory  Result Value Ref Range Status   Adenovirus NOT DETECTED NOT DETECTED Final   Coronavirus 229E NOT DETECTED NOT DETECTED Final    Comment: (NOTE) The Coronavirus on the Respiratory Panel, DOES NOT test for the novel  Coronavirus (2019 nCoV)    Coronavirus HKU1 NOT DETECTED NOT DETECTED Final   Coronavirus NL63 NOT DETECTED NOT DETECTED Final   Coronavirus OC43 NOT DETECTED NOT  DETECTED Final   Metapneumovirus NOT DETECTED NOT DETECTED Final   Rhinovirus / Enterovirus NOT DETECTED NOT DETECTED Final   Influenza A NOT DETECTED NOT DETECTED Final   Influenza B NOT DETECTED NOT DETECTED Final   Parainfluenza Virus 1 NOT DETECTED NOT DETECTED Final   Parainfluenza Virus 2 NOT DETECTED NOT DETECTED Final   Parainfluenza Virus 3 NOT DETECTED NOT DETECTED Final   Parainfluenza Virus 4 NOT DETECTED NOT DETECTED Final   Respiratory Syncytial Virus NOT DETECTED NOT DETECTED Final   Bordetella pertussis NOT DETECTED NOT DETECTED Final   Bordetella Parapertussis NOT DETECTED NOT DETECTED Final   Chlamydophila pneumoniae NOT DETECTED NOT DETECTED Final   Mycoplasma pneumoniae NOT DETECTED NOT DETECTED Final    Comment: Performed at [facility], [place], [place], [place] [address]         Radiology Studies: DG Chest Portable 1 View  Result Date: 04/10/2022 CLINICAL DATA:  Shortness of breath EXAM: PORTABLE CHEST 1 VIEW COMPARISON:  03/22/2022 FINDINGS: Right-sided tunneled central venous catheter with the tip in the right atrium. Small right pleural effusion. No pneumothorax. Unchanged cardiac and mediastinal contours. Compared to prior exam there are new linear opacities in the right lung base, which are nonspecific, and could represent atelectasis, but infection could have a similar appearance. Prominent interstitial opacities with mild peribronchial cuffing, could suggest a component of mild pulmonary edema. No displaced rib fractures. There is a left axillary vascular stent in place. IMPRESSION: 1. New patchy opacities in the right lung base are nonspecific, and could represent atelectasis, but infection could have a similar appearance. 2. Trace right pleural effusion. 3. Prominent interstitial opacities with mild peribronchial cuffing, could suggest a component of mild pulmonary edema. Electronically Signed   By: Marin Roberts M.D.   On: 04/10/2022 15:36             LOS: 4 days    Time spent: 50 min    Emeterio Reeve, DO [facility] 04/14/2022, 2:24 PM    Dictation software may have been used to generate the above note. Typos may occur and escape review in typed/dictated notes. Please contact Dr Sheppard Coil directly for clarity if needed.  Staff may message me via secure chat in [place]  but this may not receive an immediate response,  please page me for urgent matters!  If 7PM-7AM, please contact night coverage [email or web address]

## 2022-04-14 NOTE — Progress Notes (Addendum)
Per [facility], they are unable to accept patient due to capacity at this time. Instructed for md to call again tomorrow to see is he will be able to be transferred. Dr. Sheppard Coil was made aware. I did go ahead and fax Facesheet to [facility] per Dr. Ouida Sills request. Fax sent to [phone]

## 2022-04-14 NOTE — Progress Notes (Signed)
When doing rounding nurse witnessed patient having rapid eye movement and head jerking.  Patient given ativan.  VS obtained.  CBG 95. Patient is easily arousable when calling his name.  Follows commands.  Bilateral hand grip is weak.  When patient is asleep there is no jerking observed.  When patient is aroused he begins to have the eye twiching and jerking again until he does back to sleep. Rapid response and Katy Foust,NP at bedside

## 2022-04-14 NOTE — Progress Notes (Signed)
Pre hd tx pt responds to voice, alert, responds to questions appropriately, vss. 1k acid bath d/t k+ result 6.2. RN at bedside, safety maintained, access visible, will continue to monitor. Driscilla Moats, MD and S. Gwyneth Revels, NP present at bedside.

## 2022-04-14 NOTE — Progress Notes (Signed)
       CROSS COVER NOTE  NAME: Joseph Mcpherson MRN: [id number] DOB : 04-25-1946 ATTENDING PHYSICIAN: Emeterio Reeve, DO    Date of Service   04/14/2022   HPI/Events of Note   Rapid response called for seizure like activity. RN reports Joseph Mcpherson had head jerking movements, eyes rolling in the back of his head, and what sounds like horizontal nystagmus. He was given 1 mg of Ativan. RN reports he quickly falls back asleep when aroused and every time they wake him he has head jerking and eye rolling. On my bedside assessment he is lethargic but arousable to voice, tells me his name, DOB, son's name, and falls back asleep. I did not observe the head jerking and eye rolling movements described by nursing staff while I was at bedside. No focal deficits appreciated on exam. From review of today's notes he is clinically unchanged on my assessment.   Interventions   Assessment/Plan:  Spoke with Dr Joseph Mcpherson Neuro via secure chat, inquired about utility of Cer[facility] EEG while still at [facility] Dr Joseph Goodell thinks Ceribell may not be useful in picking up subtle EEG findings.  Continue PRN Ativan Continue Keppra    This document was prepared using Dragon voice recognition software and may include unintentional dictation errors.  Neomia Glass DNP, MBA, FNP-BC Nurse Practitioner [facility] Pager [phone]

## 2022-04-14 NOTE — Progress Notes (Signed)
Pt CBG 106.

## 2022-04-14 NOTE — Progress Notes (Signed)
Neurology progress note  S: Patient seen in HD. Myoclonus significantly improved after starting keppra. Mental status still fluctuating but largely intact on my evaluation this AM.   O:  Vitals:   04/14/22 0900 04/14/22 0915  BP: (!) 188/71 (!) 181/57  Pulse: 63 (!) 59  Resp: 14 16  Temp:    SpO2: 100% 100%    Physical Exam Gen: A&Ox4, NAD HEENT: Atraumatic, normocephalic; oropharynx clear, tongue without atrophy or fasciculations. Resp: CTAB, normal work of breathing CV: RRR, extremities appear well-perfused. Abd: soft/NT/ND Extrem: Nml bulk; no cyanosis, clubbing, or edema.  Neuro: *MS: A&O x4. Follows multi-step commands.  *Speech: no dysarthria or aphasia, able to name and repeat. *CN:    I: Deferred   II,III: PERRLA, VFF by confrontation, optic discs not visualized 2/2 pupillary constriction   III,IV,VI: EOMI w/o nystagmus, no ptosis   V: Sensation intact from V1 to V3 to LT   VII: Eyelid closure was full.  Smile symmetric.   VIII: Hearing intact to voice   IX,X: Voice normal, palate elevates symmetrically    XI: SCM/trap 5/5 bilat   XII: Tongue protrudes midline, no atrophy or fasciculations  *Motor:   Normal bulk.  No tremor, rigidity or bradykinesia. No pronator drift. R blepharospasm   Strength: Dlt Bic Tri WE WrF FgS Gr HF KnF KnE PlF DoF    Left '5 5 5 5 5 5 5 5 5 5 5 5    '$ Right '5 5 5 5 5 5 5 5 5 5 5 5   '$ *Sensory: Intact to light touch, pinprick, temperature vibration throughout. Symmetric. Propioception intact bilat.  No double-simultaneous extinction.  *Coordination:  Finger-to-nose, heel-to-shin, rapid alternating motions were intact. 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  The day after admission troponins trended up to over 500.  Cardiology was consulted and was concern for NSTEMI so they started a heparin drip for 48 hours.   He was improving until 11/19 when he developed new onset diffuse myoclonus. On examination some is generalized, other multifocal in migratory pattern. No hx of this in the past. On stable dose of home gabapentin '300mg'$  qhs for 2 yrs. No new medications likely to cause it. Labwork yesterday unremarkable other than K+ 5.6, Cr 8.68, BUN 40 (from 23 three days ago). No sig abnl to explain on VBG. TSH 0.200 with FT4 1.49 (known hypothyroidism, synthroid held). Ammonia and LFTs WNL. 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   Given the pattern of his myoclonus I initially favored the etiology to be a systemic issue such as side effect from medication. The 2 most likely culprits gabapentin and chlorpromazine were d/c'd yesterday.  He has been intermittently confused therefore EEG was obtained 11/20 which showed multiple abnl: - Mild diffuse slowing indicative of global cerebral dysfunction - Intermittent focal slowing, at times rhythmic, over the left posterior region indicative of focal cerebral dysfunction in that area - Frequent sharp waves (focal L parietal, less often generalized) indicating increased epileptogenicity. The majority of these do not display triphasic morphology (ie  not favored to be secondary to metabolic derangement). - Frequent clinical myoclonus by video, at times diffuse and at times multifocal, that does not necessarily correlate with his EEG abnormalities   Etiology of EEG abnormalities is unclear but is increasingly concerning for epileptic etiology given fluctuating  mental status. Myoclonus did improve after starting keppra overnight.  - Patient awaiting bed placement / transfer to [facility] - He is currently in dialysis, if bed is still not available after HD will obtain MRI brain - Continue keppra ESRD dosing '500mg'$  q 24 hrs + additional '250mg'$  after HD - If myoclonus worsens again would consider adding VPA - Will continue to follow until transfer, after which he will be followed by neurology at [facility], MD [facility] [phone]  If 7pm- 7am, please page neurology on call as listed in [place].

## 2022-04-14 NOTE — Progress Notes (Signed)
[facility]  ROUNDING NOTE   Subjective:   Patient is a 76 year old man of Asian/Indian origin with a past medical history of ESRD, on hemodialysis on Tuesday Thursday Saturday schedule, diabetes mellitus type 2, CVA, hypothyroidism who came to the ER with chief complaint of shortness of breath. He has been admitted for Shortness of breath [R06.02] Hypoxia [R09.02] HCAP (healthcare-associated pneumonia) [J18.9] Hypoxic respiratory failure ([place]) [J96.91]  Patient is known to our practice from previous admissions and receives outpatient dialysis treatments at [facility] on a TTS schedule, supervised by [facility] nephrologist.   Patient seen and evaluated during dialysis   HEMODIALYSIS FLOWSHEET:  Blood Flow Rate (mL/min): 400 mL/min Arterial Pressure (mmHg): -180 mmHg Venous Pressure (mmHg): 190 mmHg TMP (mmHg): 2 mmHg Ultrafiltration Rate (mL/min): 450 mL/min Dialysate Flow Rate (mL/min): 300 ml/min Dialysis Fluid Bolus: Normal Saline  Tolerating treatment well Due to abnormal EEG findings, patient being transferred to [facility].  Objective:  Vital signs in last 24 hours:  Temp:  [97.6 F (36.4 C)-98.5 F (36.9 C)] 98 F (36.7 C) (11/21 1238) Pulse Rate:  [59-84] 84 (11/21 1238) Resp:  [14-21] 19 (11/21 1238) BP: (144-206)/(48-91) 206/85 (11/21 1238) SpO2:  [97 %-100 %] 100 % (11/21 1238) Weight:  [62.6 kg-64.4 kg] 62.6 kg (11/21 1211)  Weight change:  Filed Weights   04/13/22 0455 04/14/22 0800 04/14/22 1211  Weight: 63.5 kg 64.4 kg 62.6 kg    Intake/Output: I/O last 3 completed shifts: In: 381 [I.V.:381] Out: -    Intake/Output this shift:  Total I/O In: -  Out: 1000 [Other:1000]  Physical Exam: General: NAD  Head: Normocephalic, atraumatic. Moist oral mucosal membranes  Eyes: Anicteric  Lungs:  clear to auscultation, normal effort, room air  Heart: Regular rate and rhythm  Abdomen:  Soft, non-tender, nondistended   Extremities: No peripheral edema.  Neurologic: Fluctuating mentation  Skin: No lesions  Access: Right chest PermCath    Basic Metabolic Panel: Recent Labs  Lab 04/10/22 1525 04/10/22 1747 04/11/22 0608 04/13/22 1033 04/13/22 1818 04/14/22 0526  NA 137  --  139 137 136 137  K 4.8  --  4.6 5.6* 5.6* 6.2*  CL 101  --  103 104 102 105  CO2 26  --  '27 27 23 24  '$ GLUCOSE 332*  --  134* 120* 113* 91  BUN 23  --  31* 40* 44* 47*  CREATININE 6.31* 6.33* 7.29* 8.68* 9.27* 9.86*  CALCIUM 8.9  --  8.9 9.2 9.1 9.4  MG  --   --   --  2.1  --   --   PHOS  --   --   --   --   --  2.6     Liver Function Tests: Recent Labs  Lab 04/11/22 0608 04/13/22 2139 04/14/22 0526  AST 16 14*  --   ALT 10 8  --   ALKPHOS 110 103  --   BILITOT 1.2 0.8  --   PROT 6.0* 5.8*  --   ALBUMIN 2.6* 2.4* 2.5*    No results for input(s): "LIPASE", "AMYLASE" in the last 168 hours. Recent Labs  Lab 04/13/22 2139  AMMONIA 29    CBC: Recent Labs  Lab 04/10/22 1525 04/10/22 1747 04/11/22 0608 04/12/22 0232 04/13/22 0540 04/14/22 0526  WBC 17.3* 13.5* 10.1 7.3 9.6 6.9  NEUTROABS 15.6*  --   --   --   --  4.7  HGB 10.1* 9.6* 9.1* 9.4* 8.5* 9.0*  HCT 33.3* 31.3* 29.5* 30.4* 28.3* 29.8*  MCV 85.2 85.5 84.8 85.2 85.5 85.4  PLT 184 181 157 170 193 223     Cardiac Enzymes: No results for input(s): "CKTOTAL", "CKMB", "CKMBINDEX", "TROPONINI" in the last 168 hours.  BNP: Invalid input(s): "POCBNP"  CBG: Recent Labs  Lab 04/13/22 2141 04/14/22 0301 04/14/22 0737 04/14/22 0832 04/14/22 1249  GLUCAP 119* 93 101* 106* 47     Microbiology: Results for orders placed or performed during the hospital encounter of 04/10/22  Resp Panel by RT-PCR (Flu A&B, Covid) Anterior Nasal Swab     Status: None   Collection Time: 04/10/22  3:25 PM   Specimen: Anterior Nasal Swab  Result Value Ref Range Status   SARS Coronavirus 2 by RT PCR NEGATIVE NEGATIVE Final    Comment: (NOTE) SARS-CoV-2 target  nucleic acids are NOT DETECTED.  The SARS-CoV-2 RNA is generally detectable in upper respiratory specimens during the acute phase of infection. The lowest concentration of SARS-CoV-2 viral copies this assay can detect is 138 copies/mL. A negative result does not preclude SARS-Cov-2 infection and should not be used as the sole basis for treatment or other patient management decisions. A negative result may occur with  improper specimen collection/handling, submission of specimen other than nasopharyngeal swab, presence of viral mutation(s) within the areas targeted by this assay, and inadequate number of viral copies(<138 copies/mL). A negative result must be combined with clinical observations, patient history, and epidemiological information. The expected result is Negative.  Fact Sheet for Patients:  EntrepreneurPulse.com.au  Fact Sheet for Healthcare Providers:  IncredibleEmployment.be  This test is no t yet approved or cleared by the [place] FDA and  has been authorized for detection and/or diagnosis of SARS-CoV-2 by FDA under an Emergency Use Authorization (EUA). This EUA will remain  in effect (meaning this test can be used) for the duration of the COVID-19 declaration under Section 564(b)(1) of the Act, 21 U.S.C.section 360bbb-3(b)(1), unless the authorization is terminated  or revoked sooner.       Influenza A by PCR NEGATIVE NEGATIVE Final   Influenza B by PCR NEGATIVE NEGATIVE Final    Comment: (NOTE) The Xpert Xpress SARS-CoV-2/FLU/RSV plus assay is intended as an aid in the diagnosis of influenza from Nasopharyngeal swab specimens and should not be used as a sole basis for treatment. Nasal washings and aspirates are unacceptable for Xpert Xpress SARS-CoV-2/FLU/RSV testing.  Fact Sheet for Patients: EntrepreneurPulse.com.au  Fact Sheet for Healthcare  Providers: IncredibleEmployment.be  This test is not yet approved or cleared by the [place] FDA and has been authorized for detection and/or diagnosis of SARS-CoV-2 by FDA under an Emergency Use Authorization (EUA). This EUA will remain in effect (meaning this test can be used) for the duration of the COVID-19 declaration under Section 564(b)(1) of the Act, 21 U.S.C. section 360bbb-3(b)(1), unless the authorization is terminated or revoked.  Performed at [facility], [address] [place], [place] [address]   Blood culture (routine x 2)     Status: None (Preliminary result)   Collection Time: 04/10/22  4:37 PM   Specimen: BLOOD  Result Value Ref Range Status   Specimen Description BLOOD BLOOD LEFT ARM  Final   Special Requests   Final    BOTTLES DRAWN AEROBIC AND ANAEROBIC Blood Culture adequate volume   Culture   Final    NO GROWTH 4 DAYS Performed at [facility], [address], [place], [place] [address]    Report Status  PENDING  Incomplete  Blood culture (routine x 2)     Status: None (Preliminary result)   Collection Time: 04/10/22  4:37 PM   Specimen: BLOOD  Result Value Ref Range Status   Specimen Description BLOOD BLOOD LEFT ARM  Final   Special Requests   Final    BOTTLES DRAWN AEROBIC AND ANAEROBIC Blood Culture adequate volume   Culture   Final    NO GROWTH 4 DAYS Performed at [facility], [address] [place], [place] [address]    Report Status PENDING  Incomplete  MRSA Next Gen by PCR, Nasal     Status: None   Collection Time: 04/10/22  6:53 PM   Specimen: Nasal Mucosa; Nasal Swab  Result Value Ref Range Status   MRSA by PCR Next Gen NOT DETECTED NOT DETECTED Final    Comment: (NOTE) The GeneXpert MRSA Assay (FDA approved for NASAL specimens only), is one component of a comprehensive MRSA colonization surveillance program. It is not intended to diagnose MRSA infection nor to guide or monitor  treatment for MRSA infections. Test performance is not FDA approved in patients less than 9 years old. Performed at [facility], [place], [place] [address]   C Difficile Quick Screen w PCR reflex     Status: None   Collection Time: 04/12/22 10:00 AM   Specimen: STOOL  Result Value Ref Range Status   C Diff antigen NEGATIVE NEGATIVE Final   C Diff toxin NEGATIVE NEGATIVE Final   C Diff interpretation No C. difficile detected.  Final    Comment: Performed at [facility], [address] [place], [place] [address]  Respiratory (~20 pathogens) panel by PCR     Status: None   Collection Time: 04/13/22  5:15 AM   Specimen: Nasopharyngeal Swab; Respiratory  Result Value Ref Range Status   Adenovirus NOT DETECTED NOT DETECTED Final   Coronavirus 229E NOT DETECTED NOT DETECTED Final    Comment: (NOTE) The Coronavirus on the Respiratory Panel, DOES NOT test for the novel  Coronavirus (2019 nCoV)    Coronavirus HKU1 NOT DETECTED NOT DETECTED Final   Coronavirus NL63 NOT DETECTED NOT DETECTED Final   Coronavirus OC43 NOT DETECTED NOT DETECTED Final   Metapneumovirus NOT DETECTED NOT DETECTED Final   Rhinovirus / Enterovirus NOT DETECTED NOT DETECTED Final   Influenza A NOT DETECTED NOT DETECTED Final   Influenza B NOT DETECTED NOT DETECTED Final   Parainfluenza Virus 1 NOT DETECTED NOT DETECTED Final   Parainfluenza Virus 2 NOT DETECTED NOT DETECTED Final   Parainfluenza Virus 3 NOT DETECTED NOT DETECTED Final   Parainfluenza Virus 4 NOT DETECTED NOT DETECTED Final   Respiratory Syncytial Virus NOT DETECTED NOT DETECTED Final   Bordetella pertussis NOT DETECTED NOT DETECTED Final   Bordetella Parapertussis NOT DETECTED NOT DETECTED Final   Chlamydophila pneumoniae NOT DETECTED NOT DETECTED Final   Mycoplasma pneumoniae NOT DETECTED NOT DETECTED Final    Comment: Performed at [facility], [place], [place], [place] [address]     Coagulation Studies: No results for input(s): "LABPROT", "INR" in the last 72 hours.    Urinalysis: No results for input(s): "COLORURINE", "LABSPEC", "PHURINE", "GLUCOSEU", "HGBUR", "BILIRUBINUR", "KETONESUR", "PROTEINUR", "UROBILINOGEN", "NITRITE", "LEUKOCYTESUR" in the last 72 hours.  Invalid input(s): "APPERANCEUR"    Imaging: EEG adult  Result Date: 04/13/2022 Derek Jack, MD     04/13/2022  8:02 PM Routine EEG Report Joseph Mcpherson is a 76 y.o.  male with a history of myoclonus and altered mental status who is undergoing an EEG to evaluate for seizures. Report: This EEG was acquired with electrodes placed according to the International 10-20 electrode system (including Fp1, Fp2, F3, F4, C3, C4, P3, P4, O1, O2, T3, T4, T5, T6, A1, A2, Fz, Cz, Pz). The following electrodes were missing or displaced: none. The best background was 6-7 Hz with intermittent focal slowing on the left. This activity is reactive to stimulation. Drowsiness was manifested by background fragmentation; deeper stages of sleep were not identified. There were frequent sharp waves, both generalized and focal over the left parietal region. The majority of these do not have a triphasic morphology. At times the focal sharp waves become briefly periodic up to 2 Hz. There are also runs of rhythmic delta best formed over the left hemisphere that last <10 seconds and rarely show mild evolution. Per technician notes he at times stops following commands during the study. By video his focal myoclonus events are not necessarily correlated to EEG change and frequently no observable myoclonus is seen during the EEG abnormalities. Aside from the electrographic abnormalities already described, there were no other epileptiform abnormalities or definitive electrographic seizures. Photic stimulation and hyperventilation were not performed. Impression and clinical correlation: This EEG was obtained while awake and drowsy and is abnormal due  to: - Mild diffuse slowing indicative of global cerebral dysfunction - Intermittent focal slowing, at times rhythmic, over the left posterior region indicative of focal cerebral dysfunction in that area - Frequent sharp waves (focal L parietal, less often generalized) indicating increased epileptogenicity. The majority of these do not display triphasic morphology (ie not favored to be secondary to metabolic derangement). - Frequent clinical myoclonus by video, at times diffuse and at times multifocal, that does not necessarily correlate with his EEG abnormalities Transfer to [facility] for continuous EEG is recommended. Discussed with Dr. Sheppard Coil and Dr. Lorrin Goodell. Su Monks, MD [facility] [phone] If 7pm- 7am, please page neurology on call as listed in [place].   CT HEAD WO CONTRAST (5MM)  Result Date: 04/13/2022 CLINICAL DATA:  Mental status change.  Unknown cause. EXAM: CT HEAD WITHOUT CONTRAST TECHNIQUE: Contiguous axial images were obtained from the base of the skull through the vertex without intravenous contrast. RADIATION DOSE REDUCTION: This exam was performed according to the departmental dose-optimization program which includes automated exposure control, adjustment of the mA and/or kV according to patient size and/or use of iterative reconstruction technique. COMPARISON:  CT head dated January 18, 2022 FINDINGS: Brain: No evidence of acute infarction, hemorrhage, hydrocephalus, extra-axial collection or mass lesion/mass effect. Prominence of the ventricles and sulci secondary to generalized cerebral atrophy, unchanged. Patchy areas of low-attenuation of the periventricular white matter presumed chronic microvascular ischemic changes. Vascular: No hyperdense vessel or unexpected calcification. Skull: Normal. Negative for fracture or focal lesion. Sinuses/Orbits: No acute finding. Other: None. IMPRESSION: 1. No acute intracranial abnormality. 2. Stable generalized cerebral atrophy  and chronic microvascular ischemic changes of the white matter. Electronically Signed   By: Keane Police D.O.   On: 04/13/2022 14:15     Medications:    anticoagulant sodium citrate     levETIRAcetam 500 mg (04/14/22 0759)   And   levETIRAcetam      aspirin EC  81 mg Oral Daily   atorvastatin  40 mg Oral Daily   calcium acetate  1,334 mg Oral TID WC   Chlorhexidine Gluconate Cloth  6 each Topical Q0600   cholecalciferol  5,000  Units Oral Daily   clopidogrel  75 mg Oral Daily   donepezil  10 mg Oral Daily   insulin aspart  0-5 Units Subcutaneous QHS   insulin aspart  0-9 Units Subcutaneous TID WC   insulin aspart  2 Units Subcutaneous TID WC   insulin glargine-yfgn  10 Units Subcutaneous QHS   isosorbide-hydrALAZINE  1 tablet Oral TID   losartan  100 mg Oral QPM   metoprolol succinate  50 mg Oral Daily   multivitamin  1 tablet Oral Daily   pantoprazole  40 mg Oral Daily   patiromer  16.8 g Oral Daily   sevelamer carbonate  2.4 g Oral Q breakfast   acetaminophen **OR** acetaminophen, alteplase, anticoagulant sodium citrate, heparin, ipratropium-albuterol, lidocaine (PF), lidocaine-prilocaine, LORazepam, metoprolol tartrate, ondansetron **OR** ondansetron (ZOFRAN) IV, oxyCODONE, pentafluoroprop-tetrafluoroeth  Chest x-ray done on March 09, 2022 IMPRESSION: 1. Cardiomegaly with vascular congestion and pulmonary edema 2. Worsening airspace disease at the right greater than left lung base, atelectasis versus pneumonia       Assessment/ Plan:  Joseph Mcpherson is a 76 y.o.  male with a past medical history of hypertension, end-stage renal disease, on hemodialysis on Monday Wednesday Friday schedule, diabetes mellitus, CVA who was brought to the ER with chief complaint of weakness.  [facility]/TTS/right chest PermCath  End-stage renal disease with hyperkalemia on hemodialysis.         Receiving treatment today, potassium 6.2.  We will dialyze on 1K bath.  UF goal 1 L as  tolerated.  Next treatment scheduled for Wednesday, due to holiday schedule.    2. Anemia of chronic kidney disease  Lab Results  Component Value Date   HGB 9.0 (L) 04/14/2022    Patient receives [place] outpatient.  Hemoglobin at desired goal.  No need for additional ESA at this time.  3. Secondary Hyperparathyroidism: with outpatient labs: PTH 422, phosphorus 6.4, calcium 9.1 on 12/29/2021.   Lab Results  Component Value Date   CALCIUM 9.4 04/14/2022   CAION 0.83 (LL) 12/10/2020   PHOS 2.6 04/14/2022    Bone minerals within desired range.  Continue calcium acetate with meals.  4.  Hypertension with chronic kidney disease.  Home regimen includes hydralazine losartan, and metoprolol.   Blood pressure elevated during dialysis, 191/71.  5. Diabetes mellitus type II with chronic kidney disease insulin dependent. Home regimen includes NovoLog and Levemir. Most recent hemoglobin A1c is 8.1 on 01/18/2022.  Currently prescribed Januvia outpatient.  Currently held.  Sliding scale insuline ordered by primary team.     LOS: 4   11/21/20231:33 PM

## 2022-04-14 NOTE — Significant Event (Signed)
Rapid Response Event Note   Reason for Call :  Seizure Activity  Initial Focused Assessment:  Patient with rapid eye movement laterally and head jerking. Ativan prior to rapid nurse's arrival to bed side. Movement would subside once patient dozed off. When waking patient up movements would return. Patient more arousable throughout rapid. On first assessment patient would only open his eyes to his name being called. It progressed to patient being able to answer orientation questions name and birth  date. Neomia Glass NP at Bedside assessing patient.  Seizure activity not new patient is awaiting a bed at [facility] regarding seizure activity. Vital signs stable through out rapid except for patient being hypertensive Sys 160s    Interventions:  -Ativan Given  Plan of Care:  -Practioner awaiting response from neurology team.   MD Notified: Neomia Glass NP Call [place]  Gonzella Lex, RN

## 2022-04-14 NOTE — Progress Notes (Signed)
Pt states "fingers are tingling", Holley Raring, MD and Gwyneth Revels, NP notified.

## 2022-04-14 NOTE — Progress Notes (Signed)
PT Cancellation Note  Patient Details Name: Joseph Mcpherson MRN: [id number] DOB: 03-Feb-1946   Cancelled Treatment:    Reason Eval/Treat Not Completed: Medical issues which prohibited therapy;Patient at procedure or test/unavailable;Patient not medically ready (Pt off unit for HD,noted elevated BP > 180s SBP, K+ 6.2. Pt also now has orders for trasnfer to [facility] due to acute neurological changes. WIll signoff and await new orders once workup is complete.)  8:51 AM, 04/14/22 Etta Grandchild, PT, DPT Physical Therapist - [facility]  [phone] ([place])    Hilde Churchman C 04/14/2022, 8:51 AM

## 2022-04-14 NOTE — Progress Notes (Signed)
Pt completed 3.5 hour HD treatment w/ no complications. Alert, vss, no c/o, report to primary RN. Start: 0826 End: 1200 1052m fluid removed 62.6kg post hd bed weight 84L BVP No HD meds ordered

## 2022-04-14 NOTE — Progress Notes (Signed)
Post hd rn assessment 

## 2022-04-14 NOTE — Progress Notes (Signed)
Pre hd rn assessment 

## 2022-04-14 NOTE — Discharge Planning (Signed)
ESTABLISHED HEMODIALYSIS PATIENT:    Outpatient facility:  [facility]  [place],  [id number] [phone]   Treatment days: Tuesday, Thursday and Saturday   Chair time: 11:50am

## 2022-04-15 DIAGNOSIS — R4182 Altered mental status, unspecified: Secondary | ICD-10-CM

## 2022-04-15 DIAGNOSIS — G253 Myoclonus: Secondary | ICD-10-CM | POA: Diagnosis not present

## 2022-04-15 DIAGNOSIS — R569 Unspecified convulsions: Secondary | ICD-10-CM | POA: Diagnosis not present

## 2022-04-15 DIAGNOSIS — R9401 Abnormal electroencephalogram [EEG]: Secondary | ICD-10-CM | POA: Diagnosis not present

## 2022-04-15 LAB — LIPID PANEL
Cholesterol: 120 mg/dL (ref 0–200)
HDL: 42 mg/dL (ref >40)
LDL Cholesterol: 66 mg/dL (ref 0–99)
Total CHOL/HDL Ratio: 2.9 RATIO
Triglycerides: 60 mg/dL (ref <150)
VLDL: 12 mg/dL (ref 0–40)

## 2022-04-15 LAB — CULTURE, BLOOD (ROUTINE X 2)
Culture: NO GROWTH
Culture: NO GROWTH
Special Requests: ADEQUATE
Special Requests: ADEQUATE

## 2022-04-15 LAB — CBC
HCT: 27.8 % — ABNORMAL LOW (ref 39.0–52.0)
Hemoglobin: 8.6 g/dL — ABNORMAL LOW (ref 13.0–17.0)
MCH: 26.1 pg (ref 26.0–34.0)
MCHC: 30.9 g/dL (ref 30.0–36.0)
MCV: 84.5 fL (ref 80.0–100.0)
Platelets: 253 10*3/uL (ref 150–400)
RBC: 3.29 MIL/uL — ABNORMAL LOW (ref 4.22–5.81)
RDW: 16.2 % — ABNORMAL HIGH (ref 11.5–15.5)
WBC: 6.5 10*3/uL (ref 4.0–10.5)
nRBC: 0 % (ref 0.0–0.2)

## 2022-04-15 LAB — BASIC METABOLIC PANEL
Anion gap: 8 (ref 5–15)
BUN: 23 mg/dL (ref 8–23)
CO2: 28 mmol/L (ref 22–32)
Calcium: 9.3 mg/dL (ref 8.9–10.3)
Chloride: 98 mmol/L (ref 98–111)
Creatinine, Ser: 5.66 mg/dL — ABNORMAL HIGH (ref 0.61–1.24)
GFR, Estimated: 10 mL/min — ABNORMAL LOW (ref >60)
Glucose, Bld: 103 mg/dL — ABNORMAL HIGH (ref 70–99)
Potassium: 4.1 mmol/L (ref 3.5–5.1)
Sodium: 134 mmol/L — ABNORMAL LOW (ref 135–145)

## 2022-04-15 LAB — GLUCOSE, CAPILLARY
Glucose-Capillary: 103 mg/dL — ABNORMAL HIGH (ref 70–99)
Glucose-Capillary: 103 mg/dL — ABNORMAL HIGH (ref 70–99)
Glucose-Capillary: 154 mg/dL — ABNORMAL HIGH (ref 70–99)
Glucose-Capillary: 129 mg/dL — ABNORMAL HIGH (ref 70–99)

## 2022-04-15 MED ORDER — OXYCODONE HCL 5 MG PO TABS: 5.0000 mg | ORAL_TABLET | Freq: Four times a day (QID) | ORAL | 0 refills | Status: DC | PRN

## 2022-04-15 MED ORDER — SODIUM CHLORIDE 0.9 % IV SOLN: 250.0000 mg | INTRAVENOUS | Status: DC

## 2022-04-15 MED ORDER — SODIUM CHLORIDE 0.9 % IV SOLN
250.0000 mg | Freq: Once | INTRAVENOUS | Status: AC
  Administered 2022-04-15: 250 mg via INTRAVENOUS
  Filled 2022-04-15: qty 2.5

## 2022-04-15 MED ORDER — INSULIN ASPART 100 UNIT/ML IJ SOLN: 0.0000 [IU] | Freq: Every day | INTRAMUSCULAR | 11 refills | Status: DC

## 2022-04-15 MED ORDER — INSULIN ASPART 100 UNIT/ML IJ SOLN: 2.0000 [IU] | Freq: Three times a day (TID) | INTRAMUSCULAR | 11 refills | Status: DC

## 2022-04-15 MED ORDER — SENNOSIDES-DOCUSATE SODIUM 8.6-50 MG PO TABS: 2.0000 | ORAL_TABLET | Freq: Two times a day (BID) | ORAL | Status: DC

## 2022-04-15 MED ORDER — HEPARIN SODIUM (PORCINE) 1000 UNIT/ML IJ SOLN
INTRAMUSCULAR | Status: AC
  Filled 2022-04-15: qty 10

## 2022-04-15 MED ORDER — INSULIN ASPART 100 UNIT/ML IJ SOLN: 0.0000 [IU] | Freq: Three times a day (TID) | INTRAMUSCULAR | 11 refills | Status: DC

## 2022-04-15 MED ORDER — INSULIN GLARGINE-YFGN 100 UNIT/ML ~~LOC~~ SOLN: 10.0000 [IU] | Freq: Every day | SUBCUTANEOUS | 11 refills | Status: DC

## 2022-04-15 MED ORDER — METOPROLOL SUCCINATE ER 100 MG PO TB24: 100.0000 mg | ORAL_TABLET | Freq: Every day | ORAL | Status: DC

## 2022-04-15 MED ORDER — PATIROMER SORBITEX CALCIUM 8.4 G PO PACK: 16.8000 g | PACK | Freq: Every day | ORAL | Status: DC

## 2022-04-15 MED ORDER — ISOSORB DINITRATE-HYDRALAZINE 20-37.5 MG PO TABS: 1.0000 | ORAL_TABLET | Freq: Three times a day (TID) | ORAL | Status: DC

## 2022-04-15 MED ORDER — RENA-VITE PO TABS: 1.0000 | ORAL_TABLET | Freq: Every day | ORAL | 0 refills | Status: DC

## 2022-04-15 MED ORDER — LEVETIRACETAM IN NACL 500 MG/100ML IV SOLN: 500.0000 mg | INTRAVENOUS | Status: DC

## 2022-04-15 MED ORDER — LORAZEPAM 2 MG/ML IJ SOLN: 1.0000 mg | Freq: Four times a day (QID) | INTRAMUSCULAR | 0 refills | Status: DC | PRN

## 2022-04-15 MED ORDER — HYDRALAZINE HCL 20 MG/ML IJ SOLN: 10.0000 mg | Freq: Four times a day (QID) | INTRAMUSCULAR | Status: DC | PRN

## 2022-04-15 NOTE — Progress Notes (Addendum)
Neurology progress note  S: Myoclonus has resolved. Patient is back to mental status baseline  MRI brain wo contrast NAICP personal review  O:  Vitals:   04/15/22 1219 04/15/22 1224  BP: (!) 181/73 (!) 186/55  Pulse: 63 68  Resp: 17 18  Temp: 98.2 F (36.8 C)   SpO2: 100% 100%    Physical Exam Gen: A&Ox4, NAD HEENT: Atraumatic, normocephalic; oropharynx clear, tongue without atrophy or fasciculations. Resp: CTAB, normal work of breathing CV: RRR, extremities appear well-perfused. Abd: soft/NT/ND Extrem: Nml bulk; no cyanosis, clubbing, or edema.  Neuro: *MS: A&O x4. Follows multi-step commands.  *Speech: no dysarthria or aphasia, able to name and repeat. *CN:    I: Deferred   II,III: PERRLA, VFF by confrontation, optic discs not visualized 2/2 pupillary constriction   III,IV,VI: EOMI w/o nystagmus, no ptosis   V: Sensation intact from V1 to V3 to LT   VII: Eyelid closure was full.  Smile symmetric.   VIII: Hearing intact to voice   IX,X: Voice normal, palate elevates symmetrically    XI: SCM/trap 5/5 bilat   XII: Tongue protrudes midline, no atrophy or fasciculations  *Motor:   Normal bulk.  No tremor, rigidity or bradykinesia. No pronator drift.    Strength: Dlt Bic Tri WE WrF FgS Gr HF KnF KnE PlF DoF    Left '5 5 5 5 5 5 5 5 5 5 5 5    '$ Right '5 5 5 5 5 5 5 5 5 5 5 5   '$ *Sensory: Intact to light touch, pinprick, temperature vibration throughout. Symmetric. Propioception intact bilat.  No double-simultaneous extinction.  *Coordination:  Finger-to-nose, heel-to-shin, rapid alternating motions were intact. 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  The day after admission troponins trended up to over 500.  Cardiology was consulted and was concern for NSTEMI so they started a heparin drip for 48 hours.   He was improving until 11/19 when he developed new onset diffuse myoclonus. On examination some is generalized, other multifocal in migratory pattern. No hx of this in the past. On stable dose of home gabapentin '300mg'$  qhs for 2 yrs. No new medications likely to cause it. Labwork yesterday unremarkable other than K+ 5.6, Cr 8.68, BUN 40 (from 23 three days ago). No sig abnl to explain on VBG. TSH 0.200 with FT4 1.49 (known hypothyroidism, synthroid held). Ammonia and LFTs WNL. 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   Given the pattern of his myoclonus I initially favored the etiology to be a systemic issue such as side effect from medication. The 2 most likely culprits gabapentin and chlorpromazine were d/c'd yesterday.  He has been intermittently confused therefore EEG was obtained 11/20 which showed multiple abnl: - Mild diffuse slowing indicative of global cerebral dysfunction - Intermittent focal slowing, at times rhythmic, over the left posterior region indicative of focal cerebral dysfunction in that area - Frequent sharp waves (focal L parietal, less often generalized) indicating increased epileptogenicity. The majority of these do not display triphasic morphology (ie not favored to be  secondary to metabolic derangement). 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 I will repeat an EEG today. If normalized, can cancel transfer  to [facility] and likely discharge tomorrow to home on keppra with close outpatient neuro f/u  - rEEG today, if normalized can cancel transfer to [facility] and tentatively plan for discharge tomorrow on keppra with close outpatient f/u - Continue keppra ESRD dosing '500mg'$  q 24 hrs + additional '250mg'$  after HD - Will continue to follow - I will arrange outpatient neurology f/u  Su Monks, MD [facility] [phone]  If [place], please page neurology on call as listed in [place].

## 2022-04-15 NOTE — TOC Transition Note (Signed)
Transition of Care [facility]) - CM/SW Discharge Note   Patient Details  Name: Joseph Mcpherson MRN: [id number] Date of Birth: [birth year]  Transition of Care [facility]) CM/SW Contact:  Tiburcio Bash, LCSW Phone Number: 04/15/2022, 9:07 AM   Clinical Narrative:     Patient to dc home with [facility] PT OT and RN through Meredeth Ide with [facility] informed of dc and [place] orders requested from MD. No further dc needs identified.    Final next level of care: [place] Barriers to Discharge: No Barriers Identified   Patient Goals and CMS Choice Patient states their goals for this hospitalization and ongoing recovery are:: to go home CMS Medicare.gov Compare Post Acute Care list provided to:: Patient Choice offered to / list presented to : Patient  Discharge Placement                       Discharge Plan and Services                          HH Arranged: PT, OT, RN [facility] Agency: [place] Date [facility] Agency Contacted: 04/15/22   Representative spoke with at [place]: [place] ([place]) Interventions     Readmission Risk Interventions    03/23/2022    2:26 PM 03/13/2022    1:51 PM  Readmission Risk Prevention Plan  Transportation Screening Complete Complete  Medication Review [employer]) Complete Complete  PCP or Specialist appointment within 3-5 days of discharge  Complete  HRI or [place] Complete Complete  SW Recovery Care/Counseling Consult Complete Complete  Palliative Care Screening Not Applicable Not [place] Not Applicable Not Applicable

## 2022-04-15 NOTE — Care Management Important Message (Signed)
Important Message  Patient Details  Name: Lynard Postlewait MRN: [id number] Date of Birth: March 18, 1946   Medicare Important Message Given:  Other (see comment)  Pending transfer to [facility]  Medicare IM withheld at this time due to acute-acute transfer.    Dannette Barbara 04/15/2022, 10:38 AM

## 2022-04-15 NOTE — Plan of Care (Signed)
  Problem: Health Behavior/Discharge Planning: Goal: Ability to manage health-related needs will improve Outcome: Progressing   Problem: Respiratory: Goal: Will maintain a patent airway Outcome: Progressing   Problem: Clinical Measurements: Goal: Cardiovascular complication will be avoided Outcome: Progressing   Problem: Pain Managment: Goal: General experience of comfort will improve Outcome: Progressing   Problem: Safety: Goal: Ability to remain free from injury will improve Outcome: Progressing

## 2022-04-15 NOTE — Progress Notes (Signed)
[facility]  ROUNDING NOTE   Subjective:   Patient is a 76 year old man of Asian/Indian origin with a past medical history of ESRD, on hemodialysis on Tuesday Thursday Saturday schedule, diabetes mellitus type 2, CVA, hypothyroidism who came to the ER with chief complaint of shortness of breath. He has been admitted for Shortness of breath [R06.02] Hypoxia [R09.02] HCAP (healthcare-associated pneumonia) [J18.9] Hypoxic respiratory failure ([place]) [J96.91]  Patient is known to our practice from previous admissions and receives outpatient dialysis treatments at [facility] on a TTS schedule, supervised by [facility] nephrologist.   Update:  Patient seen and evaluated during hemodialysis treatment this a.m.  Patient much more awake and alert today.  Potassium down to 4.1 today.     Objective:  Vital signs in last 24 hours:  Temp:  [97.6 F (36.4 C)-98.7 F (37.1 C)] 98.1 F (36.7 C) (11/22 0900) Pulse Rate:  [59-84] 68 (11/22 1015) Resp:  [13-23] 23 (11/22 1015) BP: (138-206)/(42-111) 188/66 (11/22 1015) SpO2:  [99 %-100 %] 100 % (11/22 1015) Weight:  [61.5 kg-63.3 kg] 63.3 kg (11/22 0900)  Weight change:  Filed Weights   04/14/22 1211 04/15/22 0501 04/15/22 0900  Weight: 62.6 kg 61.5 kg 63.3 kg    Intake/Output: I/O last 3 completed shifts: In: 97.8 [IV Piggyback:97.8] Out: 1600 [Urine:600; Other:1000]   Intake/Output this shift:  Total I/O In: -  Out: 500 [Urine:500]  Physical Exam: General: NAD  Head: Normocephalic, atraumatic. Moist oral mucosal membranes  Eyes: Anicteric  Lungs:  Clear bilateral, normal effort, room air  Heart: Regular rate and rhythm  Abdomen:  Soft, non-tender, nondistended  Extremities: No peripheral edema.  Neurologic: Awake, alert, conversant  Skin: No lesions  Access: Right chest PermCath    Basic Metabolic Panel: Recent Labs  Lab 04/13/22 1033 04/13/22 1818 04/14/22 0526 04/14/22 1755 04/15/22 0438  NA  137 136 137 136 134*  K 5.6* 5.6* 6.2* 4.2 4.1  CL 104 102 105 97* 98  CO2 '27 23 24 31 28  '$ GLUCOSE 120* 113* 91 130* 103*  BUN 40* 44* 47* 18 23  CREATININE 8.68* 9.27* 9.86* 4.78* 5.66*  CALCIUM 9.2 9.1 9.4 9.1 9.3  MG 2.1  --   --   --   --   PHOS  --   --  2.6  --   --      Liver Function Tests: Recent Labs  Lab 04/11/22 0608 04/13/22 2139 04/14/22 0526  AST 16 14*  --   ALT 10 8  --   ALKPHOS 110 103  --   BILITOT 1.2 0.8  --   PROT 6.0* 5.8*  --   ALBUMIN 2.6* 2.4* 2.5*    No results for input(s): "LIPASE", "AMYLASE" in the last 168 hours. Recent Labs  Lab 04/13/22 2139  AMMONIA 29     CBC: Recent Labs  Lab 04/10/22 1525 04/10/22 1747 04/11/22 0608 04/12/22 0232 04/13/22 0540 04/14/22 0526 04/15/22 0438  WBC 17.3*   < > 10.1 7.3 9.6 6.9 6.5  NEUTROABS 15.6*  --   --   --   --  4.7  --   HGB 10.1*   < > 9.1* 9.4* 8.5* 9.0* 8.6*  HCT 33.3*   < > 29.5* 30.4* 28.3* 29.8* 27.8*  MCV 85.2   < > 84.8 85.2 85.5 85.4 84.5  PLT 184   < > 157 170 193 223 253   < > = values in this interval not displayed.  Cardiac Enzymes: No results for input(s): "CKTOTAL", "CKMB", "CKMBINDEX", "TROPONINI" in the last 168 hours.  BNP: Invalid input(s): "POCBNP"  CBG: Recent Labs  Lab 04/14/22 0832 04/14/22 1249 04/14/22 1721 04/14/22 2109 04/15/22 0829  GLUCAP 106* 94 139* 138* 103*     Microbiology: Results for orders placed or performed during the hospital encounter of 04/10/22  Resp Panel by RT-PCR (Flu A&B, Covid) Anterior Nasal Swab     Status: None   Collection Time: 04/10/22  3:25 PM   Specimen: Anterior Nasal Swab  Result Value Ref Range Status   SARS Coronavirus 2 by RT PCR NEGATIVE NEGATIVE Final    Comment: (NOTE) SARS-CoV-2 target nucleic acids are NOT DETECTED.  The SARS-CoV-2 RNA is generally detectable in upper respiratory specimens during the acute phase of infection. The lowest concentration of SARS-CoV-2 viral copies this assay can  detect is 138 copies/mL. A negative result does not preclude SARS-Cov-2 infection and should not be used as the sole basis for treatment or other patient management decisions. A negative result may occur with  improper specimen collection/handling, submission of specimen other than nasopharyngeal swab, presence of viral mutation(s) within the areas targeted by this assay, and inadequate number of viral copies(<138 copies/mL). A negative result must be combined with clinical observations, patient history, and epidemiological information. The expected result is Negative.  Fact Sheet for Patients:  EntrepreneurPulse.com.au  Fact Sheet for Healthcare Providers:  IncredibleEmployment.be  This test is no t yet approved or cleared by the [place] FDA and  has been authorized for detection and/or diagnosis of SARS-CoV-2 by FDA under an Emergency Use Authorization (EUA). This EUA will remain  in effect (meaning this test can be used) for the duration of the COVID-19 declaration under Section 564(b)(1) of the Act, 21 U.S.C.section 360bbb-3(b)(1), unless the authorization is terminated  or revoked sooner.       Influenza A by PCR NEGATIVE NEGATIVE Final   Influenza B by PCR NEGATIVE NEGATIVE Final    Comment: (NOTE) The Xpert Xpress SARS-CoV-2/FLU/RSV plus assay is intended as an aid in the diagnosis of influenza from Nasopharyngeal swab specimens and should not be used as a sole basis for treatment. Nasal washings and aspirates are unacceptable for Xpert Xpress SARS-CoV-2/FLU/RSV testing.  Fact Sheet for Patients: EntrepreneurPulse.com.au  Fact Sheet for Healthcare Providers: IncredibleEmployment.be  This test is not yet approved or cleared by the [place] FDA and has been authorized for detection and/or diagnosis of SARS-CoV-2 by FDA under an Emergency Use Authorization (EUA). This EUA will remain in  effect (meaning this test can be used) for the duration of the COVID-19 declaration under Section 564(b)(1) of the Act, 21 U.S.C. section 360bbb-3(b)(1), unless the authorization is terminated or revoked.  Performed at [facility], [address] [place], [place] [address]   Blood culture (routine x 2)     Status: None   Collection Time: 04/10/22  4:37 PM   Specimen: BLOOD  Result Value Ref Range Status   Specimen Description BLOOD BLOOD LEFT ARM  Final   Special Requests   Final    BOTTLES DRAWN AEROBIC AND ANAEROBIC Blood Culture adequate volume   Culture   Final    NO GROWTH 5 DAYS Performed at [facility], [address] [place], [place] [address]    Report Status 04/15/2022 FINAL  Final  Blood culture (routine x 2)     Status: None   Collection Time: 04/10/22  4:37 PM   Specimen: BLOOD  Result Value Ref Range Status   Specimen Description BLOOD BLOOD LEFT ARM  Final   Special Requests   Final    BOTTLES DRAWN AEROBIC AND ANAEROBIC Blood Culture adequate volume   Culture   Final    NO GROWTH 5 DAYS Performed at [facility], [address] [place], [place] [address]    Report Status 04/15/2022 FINAL  Final  MRSA Next Gen by PCR, Nasal     Status: None   Collection Time: 04/10/22  6:53 PM   Specimen: Nasal Mucosa; Nasal Swab  Result Value Ref Range Status   MRSA by PCR Next Gen NOT DETECTED NOT DETECTED Final    Comment: (NOTE) The GeneXpert MRSA Assay (FDA approved for NASAL specimens only), is one component of a comprehensive MRSA colonization surveillance program. It is not intended to diagnose MRSA infection nor to guide or monitor treatment for MRSA infections. Test performance is not FDA approved in patients less than 79 years old. Performed at [facility], [place], [place] [address]   C Difficile Quick Screen w PCR reflex     Status: None   Collection Time: 04/12/22 10:00 AM   Specimen: STOOL   Result Value Ref Range Status   C Diff antigen NEGATIVE NEGATIVE Final   C Diff toxin NEGATIVE NEGATIVE Final   C Diff interpretation No C. difficile detected.  Final    Comment: Performed at [facility], [address] [place], [place] [address]  Respiratory (~20 pathogens) panel by PCR     Status: None   Collection Time: 04/13/22  5:15 AM   Specimen: Nasopharyngeal Swab; Respiratory  Result Value Ref Range Status   Adenovirus NOT DETECTED NOT DETECTED Final   Coronavirus 229E NOT DETECTED NOT DETECTED Final    Comment: (NOTE) The Coronavirus on the Respiratory Panel, DOES NOT test for the novel  Coronavirus (2019 nCoV)    Coronavirus HKU1 NOT DETECTED NOT DETECTED Final   Coronavirus NL63 NOT DETECTED NOT DETECTED Final   Coronavirus OC43 NOT DETECTED NOT DETECTED Final   Metapneumovirus NOT DETECTED NOT DETECTED Final   Rhinovirus / Enterovirus NOT DETECTED NOT DETECTED Final   Influenza A NOT DETECTED NOT DETECTED Final   Influenza B NOT DETECTED NOT DETECTED Final   Parainfluenza Virus 1 NOT DETECTED NOT DETECTED Final   Parainfluenza Virus 2 NOT DETECTED NOT DETECTED Final   Parainfluenza Virus 3 NOT DETECTED NOT DETECTED Final   Parainfluenza Virus 4 NOT DETECTED NOT DETECTED Final   Respiratory Syncytial Virus NOT DETECTED NOT DETECTED Final   Bordetella pertussis NOT DETECTED NOT DETECTED Final   Bordetella Parapertussis NOT DETECTED NOT DETECTED Final   Chlamydophila pneumoniae NOT DETECTED NOT DETECTED Final   Mycoplasma pneumoniae NOT DETECTED NOT DETECTED Final    Comment: Performed at [facility], [place]. [address], [place], [place] [address]    Coagulation Studies: No results for input(s): "LABPROT", "INR" in the last 72 hours.    Urinalysis: No results for input(s): "COLORURINE", "LABSPEC", "PHURINE", "GLUCOSEU", "HGBUR", "BILIRUBINUR", "KETONESUR", "PROTEINUR", "UROBILINOGEN", "NITRITE", "LEUKOCYTESUR" in the last 72 hours.  Invalid  input(s): "APPERANCEUR"    Imaging: MR BRAIN WO CONTRAST  Result Date: 04/14/2022 CLINICAL DATA:  Myoclonus, stroke suspected EXAM: MRI HEAD WITHOUT CONTRAST TECHNIQUE: Multiplanar, multiecho pulse sequences of the brain and surrounding structures were obtained without intravenous contrast. COMPARISON:  05/04/2019 MRI head, correlation is also made with 04/13/2022 CT head FINDINGS: Brain: No restricted diffusion to suggest acute  or subacute infarct. No acute hemorrhage, mass, mass effect, or midline shift. No hydrocephalus or extra-axial collection. Ventricular size is commensurate with degree of cerebral and cerebellar atrophy. Redemonstrated medial occipital encephalomalacia. Scattered T2 hyperintense signal in the periventricular white matter, likely the sequela of chronic small vessel ischemic disease. The hippocampi are symmetric in size and signal. No heterotopia or evidence of cortical dysgenesis. Vascular: Normal arterial flow voids. Skull and upper cervical spine: Normal marrow signal. Sinuses/Orbits: Mucous retention cysts in the right greater than left maxillary sinus and left anterior ethmoid air cells. Status post bilateral lens replacements. Other: Fluid in the left mastoid air cells. IMPRESSION: No acute intracranial process. No evidence of acute or subacute infarct. Electronically Signed   By: Merilyn Baba M.D.   On: 04/14/2022 22:00   EEG adult  Result Date: 04/13/2022 Derek Jack, MD     04/13/2022  8:02 PM Routine EEG Report Joseph Mcpherson is a 76 y.o. male with a history of myoclonus and altered mental status who is undergoing an EEG to evaluate for seizures. Report: This EEG was acquired with electrodes placed according to the International 10-20 electrode system (including Fp1, Fp2, F3, F4, C3, C4, P3, P4, O1, O2, T3, T4, T5, T6, A1, A2, Fz, Cz, Pz). The following electrodes were missing or displaced: none. The best background was 6-7 Hz with intermittent focal slowing on the  left. This activity is reactive to stimulation. Drowsiness was manifested by background fragmentation; deeper stages of sleep were not identified. There were frequent sharp waves, both generalized and focal over the left parietal region. The majority of these do not have a triphasic morphology. At times the focal sharp waves become briefly periodic up to 2 Hz. There are also runs of rhythmic delta best formed over the left hemisphere that last <10 seconds and rarely show mild evolution. Per technician notes he at times stops following commands during the study. By video his focal myoclonus events are not necessarily correlated to EEG change and frequently no observable myoclonus is seen during the EEG abnormalities. Aside from the electrographic abnormalities already described, there were no other epileptiform abnormalities or definitive electrographic seizures. Photic stimulation and hyperventilation were not performed. Impression and clinical correlation: This EEG was obtained while awake and drowsy and is abnormal due to: - Mild diffuse slowing indicative of global cerebral dysfunction - Intermittent focal slowing, at times rhythmic, over the left posterior region indicative of focal cerebral dysfunction in that area - Frequent sharp waves (focal L parietal, less often generalized) indicating increased epileptogenicity. The majority of these do not display triphasic morphology (ie not favored to be secondary to metabolic derangement). 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 Discussed with Dr. Sheppard Coil and Dr. Lorrin Goodell. Su Monks, MD [facility] [phone] If 7pm- 7am, please page neurology on call as listed in [place].   CT HEAD WO CONTRAST (5MM)  Result Date: 04/13/2022 CLINICAL DATA:  Mental status change.  Unknown cause. EXAM:  CT HEAD WITHOUT CONTRAST TECHNIQUE: Contiguous axial images were obtained from the base of the skull through the vertex without intravenous contrast. RADIATION DOSE REDUCTION: This exam was performed according to the departmental dose-optimization program which includes automated exposure control, adjustment of the mA and/or kV according to patient size and/or use of iterative reconstruction technique. COMPARISON:  CT head dated January 18, 2022 FINDINGS: Brain: No  evidence of acute infarction, hemorrhage, hydrocephalus, extra-axial collection or mass lesion/mass effect. Prominence of the ventricles and sulci secondary to generalized cerebral atrophy, unchanged. Patchy areas of low-attenuation of the periventricular white matter presumed chronic microvascular ischemic changes. Vascular: No hyperdense vessel or unexpected calcification. Skull: Normal. Negative for fracture or focal lesion. Sinuses/Orbits: No acute finding. Other: None. IMPRESSION: 1. No acute intracranial abnormality. 2. Stable generalized cerebral atrophy and chronic microvascular ischemic changes of the white matter. Electronically Signed   By: Keane Police D.O.   On: 04/13/2022 14:15     Medications:    anticoagulant sodium citrate     levETIRAcetam 500 mg (04/15/22 0818)   And   levETIRAcetam 250 mg (04/14/22 2026)    amLODipine  10 mg Oral Daily   aspirin EC  81 mg Oral Daily   atorvastatin  40 mg Oral Daily   calcium acetate  1,334 mg Oral TID WC   Chlorhexidine Gluconate Cloth  6 each Topical Q0600   cholecalciferol  5,000 Units Oral Daily   clopidogrel  75 mg Oral Daily   donepezil  10 mg Oral Daily   insulin aspart  0-5 Units Subcutaneous QHS   insulin aspart  0-9 Units Subcutaneous TID WC   insulin aspart  2 Units Subcutaneous TID WC   insulin glargine-yfgn  10 Units Subcutaneous QHS   isosorbide-hydrALAZINE  1 tablet Oral TID   losartan  100 mg Oral QPM   metoprolol succinate  100 mg Oral Daily   multivitamin  1  tablet Oral Daily   pantoprazole  40 mg Oral Daily   patiromer  16.8 g Oral Daily   senna-docusate  2 tablet Oral BID   sevelamer carbonate  2.4 g Oral Q breakfast   acetaminophen **OR** acetaminophen, alteplase, anticoagulant sodium citrate, heparin, hydrALAZINE, ipratropium-albuterol, lidocaine (PF), lidocaine-prilocaine, LORazepam, ondansetron **OR** ondansetron (ZOFRAN) IV, oxyCODONE, pentafluoroprop-tetrafluoroeth . Chest x-ray done on March 09, 2022 IMPRESSION: 1. Cardiomegaly with vascular congestion and pulmonary edema 2. Worsening airspace disease at the right greater than left lung base, atelectasis versus pneumonia       Assessment/ Plan:  [name] is a 76 y.o.  male with a past medical history of hypertension, end-stage renal disease, on hemodialysis on Monday Wednesday Friday schedule, diabetes mellitus, CVA who was brought to the ER with chief complaint of weakness.  [facility]/TTS/right chest PermCath  End-stage renal disease with hyperkalemia on hemodialysis.         Patient had significant hyperkalemia yesterday but this has been corrected.  Potassium down to 4.1.  Given the Thanksgiving holiday we are performing dialysis treatment again today.  Next dialysis treatment either Friday or Saturday.  We will monitor closely.  Patient still awaiting transfer to [facility].    2. Anemia of chronic kidney disease  Lab Results  Component Value Date   HGB 8.6 (L) 04/15/2022    Patient receives Mircera as an outpatient.  Hemoglobin currently 8.6.  Monitor hemoglobin.  3. Secondary Hyperparathyroidism: with outpatient labs: PTH 422, phosphorus 6.4, calcium 9.1 on 12/29/2021.   Lab Results  Component Value Date   CALCIUM 9.3 04/15/2022   CAION 0.83 (LL) 12/10/2020   PHOS 2.6 04/14/2022    Continue calcium acetate 2 tablets p.o. 3 times daily with meals.  4.  Hypertension with chronic kidney disease.  Home regimen includes hydralazine losartan, and  metoprolol.   Maintain the patient on amlodipine, losartan, metoprolol.  Ultrafiltration to assist with blood pressure control.  5.  Diabetes mellitus type II with chronic kidney disease insulin dependent. Home regimen includes NovoLog and Levemir. Most recent hemoglobin A1c is 8.1 on 01/18/2022.  Currently prescribed Januvia outpatient.  Currently held.  Sliding scale insuline ordered by primary team.     LOS: 5 Joseph Mcpherson 11/22/202310:19 AM

## 2022-04-15 NOTE — Progress Notes (Signed)
EEG abnl of concern have resolved by repeat rEEG today. Full report to follow. OK to cancel transfer to [facility].  Su Monks, MD [facility] [phone]  If 7pm- 7am, please page neurology on call as listed in [place].

## 2022-04-15 NOTE — Progress Notes (Signed)
Eeg done 

## 2022-04-15 NOTE — Progress Notes (Signed)
PT did 3 hrs of Tx, tolerated well with no issues.  UF = 1000 ml    04/15/22 1224  Vitals  BP (!) 186/55  MAP (mmHg) 94  BP Location Right Arm  BP Method Automatic  Patient Position (if appropriate) Lying  Pulse Rate 68  Pulse Rate Source Monitor  ECG Heart Rate 67  Resp 18  Oxygen Therapy  SpO2 100 %  O2 Device Room Air  Patient Activity (if Appropriate) In bed  Pulse Oximetry Type Continuous  Post Treatment  Dialyzer Clearance Lightly streaked  Duration of HD Treatment -hour(s) 3 hour(s)  Hemodialysis Intake (mL) 0 mL  Liters Processed 72  Fluid Removed (mL) 1000 mL  Tolerated HD Treatment Yes  Post-Hemodialysis Comments Pt tolerated tx well. No concerns/complaints at the moment. PT alert and vitals is stable. RN gave report. Awaiting transport  Note  Observations POST tx vitals  Hemodialysis Catheter Right Subclavian Double lumen Permanent (Tunneled)  No placement date or time found.   Placed prior to admission: Yes  Orientation: Right  Access Location: Subclavian  Hemodialysis Catheter Type: Double lumen Permanent (Tunneled)  Discharged from Facility with Line Intact?: Yes  Site Condition No complications  Blue Lumen Status Heparin locked  Red Lumen Status Heparin locked  Purple Lumen Status N/A  Catheter fill solution Heparin 1000 units/ml  Catheter fill volume (Arterial) 1.9 cc  Catheter fill volume (Venous) 1.9  Dressing Type Transparent  Dressing Status Antimicrobial disc in place;Clean, Dry, Intact  Interventions Other (Comment) (NA)  Drainage Description None  Dressing Change Due 04/18/22  Post treatment catheter status Capped and Clamped

## 2022-04-15 NOTE — Discharge Summary (Signed)
Physician Discharge Summary  Joseph Mcpherson GDJ:[id number] DOB: 07-20-1945 DOA: 04/10/2022  PCP: [name], MD  Admit date: 04/10/2022 Discharge date: 04/15/2022  Admitted From: Home Disposition:  Transfer to [facility] for video EEG  Discharge Condition:Stable CODE STATUS:FULL Diet recommendation: Heart Healthy Brief/Interim Summary:  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 Recently hospitalized w/ COVID pneumonia 03/22/22-03/26/22, and for NSTEMI 03/09/22-03/13/22.  He initially presented with shortness of breath with hypoxia presentation.  Chest x-ray was concerning for pulm edema versus pneumonia with elevated BNP.  Patient was admitted for the management of acute hypoxic respiratory secondary to volume overload in addition to HCAP.  Since troponin was elevated, cardiology also consulted.  Nephrology was consulted for dialysis.  Currently his respiratory status has improved and he is on room air.  On 11/20, he was noted to have some jerky type/clonic movements.  CT head did not show any acute findings.  Concern for myoclonus.  EEG shows abnormality, starting Keppra.  Plan is to transfer to [facility] for continuous EEG.    Following  problems were addressed during his hospitalization:   Seizure w/ myoclonus and abnormal EEG Metabolic encephalopathy CT head no concerns Neurology consulted --> abnormal EEG: showed -Intermittent focal slowing, at times rhythmic, over the left posterior region indicative of focal cerebral dysfunction in that area.Frequent sharp waves (focal L parietal, less often generalized) indicating increased epileptogenicity. Frequent myoclonus started Keppra  transfer for continuous EEG Neurology aware, will follow him there. Currently he is completely alert and oriented.    Acute respiratory failure with hypoxia (HCC) Acute HFrEF (heart failure with reduced ejection fraction) (HCC) Ddx: fluid overload in setting of HFrEF, possible HCAP Initially treating presumptive HCAP: but given improvement post-dialysis, pneumonia can be ruled OUT , was started on abx , now d/c Treat fluid overload HFrEF given (+)rales and pulmonary edema on CXR, known pleural effusion Improvement post-dialysis Recent echocardiogram, will not repeat I&O Continue supplemental O2 as needed - on room air as of 04/15/22    Leukocytosis - resolved, likely reactive Loose stool in setting of recent hospitalization - resolved Await CDiff screen but this seems unlikely, no further BM and WBC improving  BCx NG x2d   CAD (coronary artery disease) NSTEMI treatment but cardiology feels elevated troponins more likely d/t demand ischemia No chest pain  Continue BP control, ASA, Statin, Plavix  Cardiology consulted,got heparin gtt 48h, not candidate for intervention at this time  Cardiology were following  End stage renal disease ([place]) HD per nephrology team  Need to alert nephrology team when he gets into [facility]   Essential hypertension Home meds Continue current medications   Insulin dependent type 2 diabetes mellitus ([place]) SSI and basal insulin Monitor blood sugars   Hypothyroidism w/ low TSH  holding home levothyroxine for now( check when he gets [facility]) Not sure why thyroid was checked during this admission.  TSH found to be low, T4 is slightly high, pending free T3 If free T3 does not correlate with TSH and free T4, most likely  euthyroid sick syndrome and his home levothyroxine should be continued   Mixed hyperlipidemia Continue statin    Dementia (HCC) Continue donepezil    Hx CVA Continue BP control, ASA, Statin, Plavix    Anemia due to chronic kidney disease Trend CBC  Hb stable    Discharge Diagnoses:  Principal Problem:   Seizure ([place]) Active Problems:   Acute  respiratory failure with hypoxia (HCC)   End stage renal disease (HCC)   Essential hypertension   Insulin dependent type 2 diabetes mellitus ([place])   Hypothyroidism   Mixed hyperlipidemia   Dementia (HCC)   CAD (coronary artery disease)   Leukocytosis   Anemia due to chronic kidney disease   Acute HFrEF (heart failure with reduced ejection fraction) (HCC)   Hypoxic respiratory failure (HCC)   Myoclonus   Abnormal EEG    Discharge Instructions  Discharge Instructions     Diet - low sodium heart healthy   Complete by: As directed    No wound care   Complete by: As directed       Allergies as of 04/15/2022   No Known Allergies      Medication List     STOP taking these medications    chlorproMAZINE 25 MG tablet Commonly known as: THORAZINE   gabapentin 300 MG capsule Commonly known as: NEURONTIN   insulin detemir 100 UNIT/ML injection Commonly known as: LEVEMIR   isosorbide mononitrate 30 MG 24 hr tablet Commonly known as: IMDUR   multivitamin with minerals tablet   predniSONE 10 MG tablet Commonly known as: DELTASONE       TAKE these medications    albuterol (2.5 MG/3ML) 0.083% nebulizer solution Commonly known as: PROVENTIL Take 3 mLs (2.5 mg total) by nebulization every 6 (six) hours as needed for wheezing or shortness of breath.   amLODipine 10 MG tablet Commonly known as: NORVASC Take 1 tablet (10 mg total) by mouth daily.   aspirin EC 81 MG tablet Take 81 mg by mouth daily. Swallow whole.   atorvastatin 40 MG tablet Commonly known as: LIPITOR Take 1 tablet (40 mg total) by mouth daily.   BD Pen Needle Nano 2nd Gen 32G X 4 MM Misc Generic drug: Insulin Pen Needle USE AS DIRECTED   calcium acetate 667 MG capsule Commonly known as: PHOSLO TAKE 2 CAPSULES BY MOUTH 3 TIMES A DAY WITH MEAL   clopidogrel 75 MG tablet Commonly known as: PLAVIX Take 1 tablet (75 mg total) by mouth daily.   donepezil 10 MG tablet Commonly known as:  ARICEPT TAKE 1 TABLET BY MOUTH EVERY DAY   FreeStyle Libre 14 Day Sensor Misc APPLY EVERY 14 (FOURTEEN) DAYS.   hydrALAZINE 20 MG/ML injection Commonly known as: APRESOLINE Inject 0.5 mLs (10 mg total) into the vein every 6 (six) hours as needed (SBP >160 mmHg).   insulin aspart 100 UNIT/ML injection Commonly known as: novoLOG Inject 0-5 Units into the skin at bedtime.   insulin aspart 100 UNIT/ML injection Commonly known as: novoLOG Inject 0-9 Units into the skin 3 (three) times daily with meals.   insulin aspart 100 UNIT/ML injection Commonly known as: novoLOG Inject 2 Units into the skin 3 (three) times daily with meals.   insulin glargine-yfgn 100 UNIT/ML injection Commonly known as: SEMGLEE Inject 0.1 mLs (10 Units total) into the skin at bedtime.   ipratropium-albuterol 0.5-2.5 (3) MG/3ML Soln Commonly known as: DUONEB Take 3 mLs by nebulization every 6 (six) hours as needed.   isosorbide-hydrALAZINE 20-37.5 MG tablet Commonly known as: BIDIL Take 1 tablet by mouth 3 (three) times daily.   levETIRAcetam 250 mg in sodium chloride 0.9 % 100 mL Inject 250 mg into the vein Every Tuesday,Thursday,and Saturday with dialysis. Start taking on: April 16, 2022   levETIRAcetam  500 MG/100ML Soln Commonly known as: KEPRRA Inject 100 mLs (500 mg total) into the vein daily. Start taking on: April 16, 2022   levothyroxine 137 MCG tablet Commonly known as: SYNTHROID TAKE 1 TABLET BY MOUTH EVERY DAY   LORazepam 2 MG/ML injection Commonly known as: ATIVAN Inject 0.5 mLs (1 mg total) into the vein every 6 (six) hours as needed for anxiety or sedation.   losartan 100 MG tablet Commonly known as: COZAAR Take 1 tablet (100 mg total) by mouth every evening.   metoprolol succinate 100 MG 24 hr tablet Commonly known as: TOPROL-XL Take 1 tablet (100 mg total) by mouth daily. Take with or immediately following a meal. What changed:  medication strength how much to  take additional instructions   oxyCODONE 5 MG immediate release tablet Commonly known as: Oxy IR/ROXICODONE Take 1 tablet (5 mg total) by mouth every 6 (six) hours as needed for moderate pain.   pantoprazole 40 MG tablet Commonly known as: PROTONIX TAKE 1 TABLET BY MOUTH EVERY DAY   patiromer 8.4 g packet Commonly known as: VELTASSA Take 2 packets (16.8 g total) by mouth daily.   Rena-Vite Rx 1 MG Tabs Take 1 tablet by mouth daily. What changed: Another medication with the same name was added. Make sure you understand how and when to take each.   multivitamin Tabs tablet Take 1 tablet by mouth daily. What changed: You were already taking a medication with the same name, and this prescription was added. Make sure you understand how and when to take each.   senna-docusate 8.6-50 MG tablet Commonly known as: Senokot-S Take 2 tablets by mouth 2 (two) times daily.   sevelamer carbonate 2.4 g Pack Commonly known as: RENVELA Take 2.4 g by mouth daily.   sitaGLIPtin 50 MG tablet Commonly known as: JANUVIA Take 50 mg by mouth every evening.   sodium chloride flush 0.9 % Soln Commonly known as: NS Inject 5 mLs into the vein daily. Flush cholecystostomy tube daily with 5 ccs of NS   Vitamin D3 125 MCG (5000 UT) Tabs Take 5,000 Units by mouth daily.        No Known Allergies  Consultations: Cardiology, nephrology, neurology   Procedures/Studies: MR BRAIN WO CONTRAST  Result Date: 04/14/2022 CLINICAL DATA:  Myoclonus, stroke suspected EXAM: MRI HEAD WITHOUT CONTRAST TECHNIQUE: Multiplanar, multiecho pulse sequences of the brain and surrounding structures were obtained without intravenous contrast. COMPARISON:  05/04/2019 MRI head, correlation is also made with 04/13/2022 CT head FINDINGS: Brain: No restricted diffusion to suggest acute or subacute infarct. No acute hemorrhage, mass, mass effect, or midline shift. No hydrocephalus or extra-axial collection. Ventricular size  is commensurate with degree of cerebral and cerebellar atrophy. Redemonstrated medial occipital encephalomalacia. Scattered T2 hyperintense signal in the periventricular white matter, likely the sequela of chronic small vessel ischemic disease. The hippocampi are symmetric in size and signal. No heterotopia or evidence of cortical dysgenesis. Vascular: Normal arterial flow voids. Skull and upper cervical spine: Normal marrow signal. Sinuses/Orbits: Mucous retention cysts in the right greater than left maxillary sinus and left anterior ethmoid air cells. Status post bilateral lens replacements. Other: Fluid in the left mastoid air cells. IMPRESSION: No acute intracranial process. No evidence of acute or subacute infarct. Electronically Signed   By: Merilyn Baba M.D.   On: 04/14/2022 22:00   EEG adult  Result Date: 04/13/2022 Derek Jack, MD     04/13/2022  8:02 PM Routine EEG Report Townsend Cudworth is a  76 y.o. male with a history of myoclonus and altered mental status who is undergoing an EEG to evaluate for seizures. Report: This EEG was acquired with electrodes placed according to the International 10-20 electrode system (including Fp1, Fp2, F3, F4, C3, C4, P3, P4, O1, O2, T3, T4, T5, T6, A1, A2, Fz, Cz, Pz). The following electrodes were missing or displaced: none. The best background was 6-7 Hz with intermittent focal slowing on the left. This activity is reactive to stimulation. Drowsiness was manifested by background fragmentation; deeper stages of sleep were not identified. There were frequent sharp waves, both generalized and focal over the left parietal region. The majority of these do not have a triphasic morphology. At times the focal sharp waves become briefly periodic up to 2 Hz. There are also runs of rhythmic delta best formed over the left hemisphere that last <10 seconds and rarely show mild evolution. Per technician notes he at times stops following commands during the study. By video his  focal myoclonus events are not necessarily correlated to EEG change and frequently no observable myoclonus is seen during the EEG abnormalities. Aside from the electrographic abnormalities already described, there were no other epileptiform abnormalities or definitive electrographic seizures. Photic stimulation and hyperventilation were not performed. Impression and clinical correlation: This EEG was obtained while awake and drowsy and is abnormal due to: - Mild diffuse slowing indicative of global cerebral dysfunction - Intermittent focal slowing, at times rhythmic, over the left posterior region indicative of focal cerebral dysfunction in that area - Frequent sharp waves (focal L parietal, less often generalized) indicating increased epileptogenicity. The majority of these do not display triphasic morphology (ie not favored to be secondary to metabolic derangement). - Frequent clinical myoclonus by video, at times diffuse and at times multifocal, that does not necessarily correlate with his EEG abnormalities Transfer to [facility] for continuous EEG is recommended. Discussed with Dr. Sheppard Coil and Dr. Lorrin Goodell. Su Monks, MD [facility] [phone] If 7pm- 7am, please page neurology on call as listed in [place].   CT HEAD WO CONTRAST (5MM)  Result Date: 04/13/2022 CLINICAL DATA:  Mental status change.  Unknown cause. EXAM: CT HEAD WITHOUT CONTRAST TECHNIQUE: Contiguous axial images were obtained from the base of the skull through the vertex without intravenous contrast. RADIATION DOSE REDUCTION: This exam was performed according to the departmental dose-optimization program which includes automated exposure control, adjustment of the mA and/or kV according to patient size and/or use of iterative reconstruction technique. COMPARISON:  CT head dated January 18, 2022 FINDINGS: Brain: No evidence of acute infarction, hemorrhage, hydrocephalus, extra-axial collection or mass lesion/mass effect.  Prominence of the ventricles and sulci secondary to generalized cerebral atrophy, unchanged. Patchy areas of low-attenuation of the periventricular white matter presumed chronic microvascular ischemic changes. Vascular: No hyperdense vessel or unexpected calcification. Skull: Normal. Negative for fracture or focal lesion. Sinuses/Orbits: No acute finding. Other: None. IMPRESSION: 1. No acute intracranial abnormality. 2. Stable generalized cerebral atrophy and chronic microvascular ischemic changes of the white matter. Electronically Signed   By: Keane Police D.O.   On: 04/13/2022 14:15   DG Chest Portable 1 View  Result Date: 04/10/2022 CLINICAL DATA:  Shortness of breath EXAM: PORTABLE CHEST 1 VIEW COMPARISON:  03/22/2022 FINDINGS: Right-sided tunneled central venous catheter with the tip in the right atrium. Small right pleural effusion. No pneumothorax. Unchanged cardiac and mediastinal contours. Compared to prior exam there are new linear opacities in the right lung base, which are nonspecific, and could represent  atelectasis, but infection could have a similar appearance. Prominent interstitial opacities with mild peribronchial cuffing, could suggest a component of mild pulmonary edema. No displaced rib fractures. There is a left axillary vascular stent in place. IMPRESSION: 1. New patchy opacities in the right lung base are nonspecific, and could represent atelectasis, but infection could have a similar appearance. 2. Trace right pleural effusion. 3. Prominent interstitial opacities with mild peribronchial cuffing, could suggest a component of mild pulmonary edema. Electronically Signed   By: Marin Roberts M.D.   On: 04/10/2022 15:36   CT CHEST WO CONTRAST  Result Date: 03/22/2022 CLINICAL DATA:  Pneumonia. EXAM: CT CHEST WITHOUT CONTRAST TECHNIQUE: Multidetector CT imaging of the chest was performed following the standard protocol without IV contrast. RADIATION DOSE REDUCTION: This exam was  performed according to the departmental dose-optimization program which includes automated exposure control, adjustment of the mA and/or kV according to patient size and/or use of iterative reconstruction technique. COMPARISON:  Two-view chest x-ray 03/22/2022. FINDINGS: Cardiovascular: Is mildly enlarged. Dense coronary artery calcifications are present. Atherosclerotic changes are present at the aortic arch and great vessel origins without definite stenosis. A right IJ dialysis catheter terminates in the right atrium. Calcifications are present at the aortic valve. No significant pericardial effusion is present. Mediastinum/Nodes: Paratracheal lymph nodes measure up to 11 mm in short access. No significant hilar or axillary adenopathy is present. Lungs/Pleura: Moderate bilateral pleural effusions are present. Dependent atelectasis is associated. Additional peribronchial ground-glass attenuation is present in the lingula and to a lesser extent non dependent left lower lobe. More subtle airspace opacities are present in the right upper lobe. Minimal consolidation is present in the right middle lobe. Airways are patent. Upper Abdomen: Cholecystostomy tube noted. Atherosclerotic changes are present. Upper abdomen is otherwise unremarkable. Musculoskeletal: No acute or focal osseous abnormalities are present. IMPRESSION: 1. Moderate bilateral pleural effusions with associated dependent atelectasis. 2. Peribronchial ground-glass attenuation in the lingula and to a lesser extent non dependent left lower lobe. More subtle airspace opacities are present in the right upper lobe. Findings are concerning for multifocal pneumonia. 3. Cholecystostomy tube noted. 4. Coronary artery disease. 5. Right IJ dialysis catheter terminates in the right atrium. 6.  Aortic Atherosclerosis (ICD10-I70.0). Electronically Signed   By: San Morelle M.D.   On: 03/22/2022 15:08   DG Chest 2 View  Result Date: 03/22/2022 CLINICAL  DATA:  Shortness of breath EXAM: CHEST - 2 VIEW COMPARISON:  Chest radiograph dated 03/09/2022. FINDINGS: The heart size and mediastinal contours are within normal limits. Vascular calcifications are seen in the aortic arch. There is a small left pleural effusion with associated atelectasis. The right lung is clear and there is no right pleural effusion. No pneumothorax on either side. A right internal jugular central venous catheter tip overlies the right atrium. A vascular stent overlies the left axilla. A pigtail catheter overlies the gallbladder fossa. Degenerative changes are seen in the spine. IMPRESSION: 1. Small left pleural effusion with associated atelectasis. 2. Right internal jugular central venous catheter tip overlies the right atrium. Electronically Signed   By: Zerita Boers M.D.   On: 03/22/2022 11:56   IR CHOLANGIOGRAM EXISTING TUBE  Result Date: 03/18/2022 INDICATION: 76 year old with history of acalculous cholecystitis. Cholecystostomy tube was placed on 01/21/2022. Patient is being evaluated for cholecystectomy and request for a cholangiogram. EXAM: CHOLANGIOGRAM THROUGH EXISTING CATHETER MEDICATIONS: None ANESTHESIA/SEDATION: None FLUOROSCOPY: Radiation Exposure Index (as provided by the fluoroscopic device): 5.9 mGy Kerma COMPLICATIONS: None immediate. PROCEDURE: Patient was  placed supine on the interventional table. 7 mL Omnipaque 300 was injected through the cholecystostomy tube. The tube was then flushed with normal saline. By patient's request, we attached the tube back to a gravity bag. Cholecystostomy tube was redressed. Fluoroscopic images were taken and saved for this procedure. FINDINGS: Pigtail portion of the drain is positioned in the gallbladder fundus. Radiopaque marker is probably external to the gallbladder. The gallbladder is decompressed. There is immediate opacification of the cystic duct and common bile duct. Contrast rapidly drains into the duodenum. No large filling  defects or obstructing stones. IMPRESSION: 1. Cholecystostomy tube is slightly retracted but still appropriately positioned within the gallbladder. 2. Gallbladder is decompressed. Cystic duct and common bile duct are widely patent. Discussed a capping trial with the patient and wife but they preferred to continue with gravity drainage. Patient is scheduled to follow-up with general surgery. Electronically Signed   By: Markus Daft M.D.   On: 03/18/2022 13:12      Subjective: Patient seen and examined at the bedside this morning.  Very comfortable.  Lying in bed.  Completely alert and oriented.  Denies any complaints like shortness of breath, cough.  No peripheral edema noted.  Lungs are almost clear on auscultation.  We discussed about plan for transfer to [facility].  He was concerned about his wife driving to [facility] to visit him.  Discharge Exam: Vitals:   04/15/22 0414 04/15/22 0832  BP: (!) 166/56 (!) 173/51  Pulse: 62 64  Resp: 17 18  Temp: 98.7 F (37.1 C) 98 F (36.7 C)  SpO2: 100% 100%   Vitals:   04/14/22 2348 04/15/22 0414 04/15/22 0501 04/15/22 0832  BP: (!) 138/42 (!) 166/56  (!) 173/51  Pulse: 66 62  64  Resp: '19 17  18  '$ Temp: 98.3 F (36.8 C) 98.7 F (37.1 C)  98 F (36.7 C)  TempSrc: Oral Oral  Oral  SpO2: 100% 100%  100%  Weight:   61.5 kg   Height:        General: Pt is alert, awake, not in acute distress,pleasant male Cardiovascular: RRR, S1/S2 +, no rubs, no gallops, hemodialysis catheter on the right chest Respiratory: Mildly diminished air entry bilaterally, no wheezing, no rhonchi Abdominal: Soft, NT, ND, bowel sounds + Extremities: no edema, no cyanosis    The results of significant diagnostics from this hospitalization (including imaging, microbiology, ancillary and laboratory) are listed below for reference.     Microbiology: Recent Results (from the past 240 hour(s))  Resp Panel by RT-PCR (Flu A&B, Covid) Anterior Nasal Swab     Status: None   Collection  Time: 04/10/22  3:25 PM   Specimen: Anterior Nasal Swab  Result Value Ref Range Status   SARS Coronavirus 2 by RT PCR NEGATIVE NEGATIVE Final    Comment: (NOTE) SARS-CoV-2 target nucleic acids are NOT DETECTED.  The SARS-CoV-2 RNA is generally detectable in upper respiratory specimens during the acute phase of infection. The lowest concentration of SARS-CoV-2 viral copies this assay can detect is 138 copies/mL. A negative result does not preclude SARS-Cov-2 infection and should not be used as the sole basis for treatment or other patient management decisions. A negative result may occur with  improper specimen collection/handling, submission of specimen other than nasopharyngeal swab, presence of viral mutation(s) within the areas targeted by this assay, and inadequate number of viral copies(<138 copies/mL). A negative result must be combined with clinical observations, patient history, and epidemiological information. The expected result is Negative.  Fact Sheet for Patients:  EntrepreneurPulse.com.au  Fact Sheet for Healthcare Providers:  IncredibleEmployment.be  This test is no t yet approved or cleared by the [place] FDA and  has been authorized for detection and/or diagnosis of SARS-CoV-2 by FDA under an Emergency Use Authorization (EUA). This EUA will remain  in effect (meaning this test can be used) for the duration of the COVID-19 declaration under Section 564(b)(1) of the Act, 21 U.S.C.section 360bbb-3(b)(1), unless the authorization is terminated  or revoked sooner.       Influenza A by PCR NEGATIVE NEGATIVE Final   Influenza B by PCR NEGATIVE NEGATIVE Final    Comment: (NOTE) The Xpert Xpress SARS-CoV-2/FLU/RSV plus assay is intended as an aid in the diagnosis of influenza from Nasopharyngeal swab specimens and should not be used as a sole basis for treatment. Nasal washings and aspirates are unacceptable for Xpert Xpress  SARS-CoV-2/FLU/RSV testing.  Fact Sheet for Patients: EntrepreneurPulse.com.au  Fact Sheet for Healthcare Providers: IncredibleEmployment.be  This test is not yet approved or cleared by the [place] FDA and has been authorized for detection and/or diagnosis of SARS-CoV-2 by FDA under an Emergency Use Authorization (EUA). This EUA will remain in effect (meaning this test can be used) for the duration of the COVID-19 declaration under Section 564(b)(1) of the Act, 21 U.S.C. section 360bbb-3(b)(1), unless the authorization is terminated or revoked.  Performed at [facility], [address] [place], [place] [address]   Blood culture (routine x 2)     Status: None (Preliminary result)   Collection Time: 04/10/22  4:37 PM   Specimen: BLOOD  Result Value Ref Range Status   Specimen Description BLOOD BLOOD LEFT ARM  Final   Special Requests   Final    BOTTLES DRAWN AEROBIC AND ANAEROBIC Blood Culture adequate volume   Culture   Final    NO GROWTH 4 DAYS Performed at [facility], [address], [place], [place] [address]    Report Status PENDING  Incomplete  Blood culture (routine x 2)     Status: None (Preliminary result)   Collection Time: 04/10/22  4:37 PM   Specimen: BLOOD  Result Value Ref Range Status   Specimen Description BLOOD BLOOD LEFT ARM  Final   Special Requests   Final    BOTTLES DRAWN AEROBIC AND ANAEROBIC Blood Culture adequate volume   Culture   Final    NO GROWTH 4 DAYS Performed at [facility], [address] [place], [place] [address]    Report Status PENDING  Incomplete  MRSA Next Gen by PCR, Nasal     Status: None   Collection Time: 04/10/22  6:53 PM   Specimen: Nasal Mucosa; Nasal Swab  Result Value Ref Range Status   MRSA by PCR Next Gen NOT DETECTED NOT DETECTED Final    Comment: (NOTE) The GeneXpert MRSA Assay (FDA approved for NASAL specimens only), is one component of a  comprehensive MRSA colonization surveillance program. It is not intended to diagnose MRSA infection nor to guide or monitor treatment for MRSA infections. Test performance is not FDA approved in patients less than 55 years old. Performed at [facility], [place], [place] [address]   C Difficile Quick Screen w PCR reflex     Status: None   Collection Time: 04/12/22 10:00 AM   Specimen: STOOL  Result Value Ref Range Status   C Diff antigen NEGATIVE NEGATIVE Final   C Diff toxin NEGATIVE NEGATIVE  Final   C Diff interpretation No C. difficile detected.  Final    Comment: Performed at [facility], [address] [place], [place] [address]  Respiratory (~20 pathogens) panel by PCR     Status: None   Collection Time: 04/13/22  5:15 AM   Specimen: Nasopharyngeal Swab; Respiratory  Result Value Ref Range Status   Adenovirus NOT DETECTED NOT DETECTED Final   Coronavirus 229E NOT DETECTED NOT DETECTED Final    Comment: (NOTE) The Coronavirus on the Respiratory Panel, DOES NOT test for the novel  Coronavirus (2019 nCoV)    Coronavirus HKU1 NOT DETECTED NOT DETECTED Final   Coronavirus NL63 NOT DETECTED NOT DETECTED Final   Coronavirus OC43 NOT DETECTED NOT DETECTED Final   Metapneumovirus NOT DETECTED NOT DETECTED Final   Rhinovirus / Enterovirus NOT DETECTED NOT DETECTED Final   Influenza A NOT DETECTED NOT DETECTED Final   Influenza B NOT DETECTED NOT DETECTED Final   Parainfluenza Virus 1 NOT DETECTED NOT DETECTED Final   Parainfluenza Virus 2 NOT DETECTED NOT DETECTED Final   Parainfluenza Virus 3 NOT DETECTED NOT DETECTED Final   Parainfluenza Virus 4 NOT DETECTED NOT DETECTED Final   Respiratory Syncytial Virus NOT DETECTED NOT DETECTED Final   Bordetella pertussis NOT DETECTED NOT DETECTED Final   Bordetella Parapertussis NOT DETECTED NOT DETECTED Final   Chlamydophila pneumoniae NOT DETECTED NOT DETECTED Final   Mycoplasma pneumoniae NOT  DETECTED NOT DETECTED Final    Comment: Performed at [facility], [place]. [address] [place], [place] [address]     Labs: BNP (last 3 results) Recent Labs    03/09/22 1901 03/22/22 1221 03/31/22 1700  BNP 2,687.5* >4,500.0* 8,185.6*   Basic Metabolic Panel: Recent Labs  Lab 04/13/22 1033 04/13/22 1818 04/14/22 0526 04/14/22 1755 04/15/22 0438  NA 137 136 137 136 134*  K 5.6* 5.6* 6.2* 4.2 4.1  CL 104 102 105 97* 98  CO2 '27 23 24 31 28  '$ GLUCOSE 120* 113* 91 130* 103*  BUN 40* 44* 47* 18 23  CREATININE 8.68* 9.27* 9.86* 4.78* 5.66*  CALCIUM 9.2 9.1 9.4 9.1 9.3  MG 2.1  --   --   --   --   PHOS  --   --  2.6  --   --    Liver Function Tests: Recent Labs  Lab 04/11/22 0608 04/13/22 2139 04/14/22 0526  AST 16 14*  --   ALT 10 8  --   ALKPHOS 110 103  --   BILITOT 1.2 0.8  --   PROT 6.0* 5.8*  --   ALBUMIN 2.6* 2.4* 2.5*   No results for input(s): "LIPASE", "AMYLASE" in the last 168 hours. Recent Labs  Lab 04/13/22 2139  AMMONIA 29   CBC: Recent Labs  Lab 04/10/22 1525 04/10/22 1747 04/11/22 0608 04/12/22 0232 04/13/22 0540 04/14/22 0526 04/15/22 0438  WBC 17.3*   < > 10.1 7.3 9.6 6.9 6.5  NEUTROABS 15.6*  --   --   --   --  4.7  --   HGB 10.1*   < > 9.1* 9.4* 8.5* 9.0* 8.6*  HCT 33.3*   < > 29.5* 30.4* 28.3* 29.8* 27.8*  MCV 85.2   < > 84.8 85.2 85.5 85.4 84.5  PLT 184   < > 157 170 193 223 253   < > = values in this interval not displayed.   Cardiac Enzymes: No results for input(s): "CKTOTAL", "CKMB", "CKMBINDEX", "TROPONINI" in the last  168 hours. BNP: Invalid input(s): "POCBNP" CBG: Recent Labs  Lab 04/14/22 0832 04/14/22 1249 04/14/22 1721 04/14/22 2109 04/15/22 0829  GLUCAP 106* 94 139* 138* 103*   D-Dimer No results for input(s): "DDIMER" in the last 72 hours. Hgb A1c No results for input(s): "HGBA1C" in the last 72 hours. Lipid Profile Recent Labs    04/15/22 0438  CHOL 120  HDL 42  LDLCALC 66  TRIG 60  CHOLHDL 2.9    Thyroid function studies No results for input(s): "TSH", "T4TOTAL", "T3FREE", "THYROIDAB" in the last 72 hours.  Invalid input(s): "FREET3" Anemia work up No results for input(s): "VITAMINB12", "FOLATE", "FERRITIN", "TIBC", "IRON", "RETICCTPCT" in the last 72 hours. Urinalysis    Component Value Date/Time   COLORURINE Straw 09/11/2013 1933   APPEARANCEUR Clear 09/11/2013 1933   LABSPEC 1.016 09/11/2013 1933   PHURINE 7.0 09/11/2013 1933   GLUCOSEU >=500 09/11/2013 1933   HGBUR Negative 09/11/2013 1933   BILIRUBINUR Negative 09/11/2013 1933   KETONESUR Negative 09/11/2013 1933   PROTEINUR 100 mg/dL 09/11/2013 1933   NITRITE Negative 09/11/2013 1933   LEUKOCYTESUR Negative 09/11/2013 1933   Sepsis Labs Recent Labs  Lab 04/12/22 0232 04/13/22 0540 04/14/22 0526 04/15/22 0438  WBC 7.3 9.6 6.9 6.5   Microbiology Recent Results (from the past 240 hour(s))  Resp Panel by RT-PCR (Flu A&B, Covid) Anterior Nasal Swab     Status: None   Collection Time: 04/10/22  3:25 PM   Specimen: Anterior Nasal Swab  Result Value Ref Range Status   SARS Coronavirus 2 by RT PCR NEGATIVE NEGATIVE Final    Comment: (NOTE) SARS-CoV-2 target nucleic acids are NOT DETECTED.  The SARS-CoV-2 RNA is generally detectable in upper respiratory specimens during the acute phase of infection. The lowest concentration of SARS-CoV-2 viral copies this assay can detect is 138 copies/mL. A negative result does not preclude SARS-Cov-2 infection and should not be used as the sole basis for treatment or other patient management decisions. A negative result may occur with  improper specimen collection/handling, submission of specimen other than nasopharyngeal swab, presence of viral mutation(s) within the areas targeted by this assay, and inadequate number of viral copies(<138 copies/mL). A negative result must be combined with clinical observations, patient history, and epidemiological information. The  expected result is Negative.  Fact Sheet for Patients:  EntrepreneurPulse.com.au  Fact Sheet for Healthcare Providers:  IncredibleEmployment.be  This test is no t yet approved or cleared by the [place] FDA and  has been authorized for detection and/or diagnosis of SARS-CoV-2 by FDA under an Emergency Use Authorization (EUA). This EUA will remain  in effect (meaning this test can be used) for the duration of the COVID-19 declaration under Section 564(b)(1) of the Act, 21 U.S.C.section 360bbb-3(b)(1), unless the authorization is terminated  or revoked sooner.       Influenza A by PCR NEGATIVE NEGATIVE Final   Influenza B by PCR NEGATIVE NEGATIVE Final    Comment: (NOTE) The Xpert Xpress SARS-CoV-2/FLU/RSV plus assay is intended as an aid in the diagnosis of influenza from Nasopharyngeal swab specimens and should not be used as a sole basis for treatment. Nasal washings and aspirates are unacceptable for Xpert Xpress SARS-CoV-2/FLU/RSV testing.  Fact Sheet for Patients: EntrepreneurPulse.com.au  Fact Sheet for Healthcare Providers: IncredibleEmployment.be  This test is not yet approved or cleared by the [place] FDA and has been authorized for detection and/or diagnosis of SARS-CoV-2 by FDA under an Emergency Use Authorization (EUA). This EUA will remain in  effect (meaning this test can be used) for the duration of the COVID-19 declaration under Section 564(b)(1) of the Act, 21 U.S.C. section 360bbb-3(b)(1), unless the authorization is terminated or revoked.  Performed at [facility], [address] [place], [place] [address]   Blood culture (routine x 2)     Status: None (Preliminary result)   Collection Time: 04/10/22  4:37 PM   Specimen: BLOOD  Result Value Ref Range Status   Specimen Description BLOOD BLOOD LEFT ARM  Final   Special Requests   Final    BOTTLES DRAWN AEROBIC  AND ANAEROBIC Blood Culture adequate volume   Culture   Final    NO GROWTH 4 DAYS Performed at [facility], [address] [place], [place] [address]    Report Status PENDING  Incomplete  Blood culture (routine x 2)     Status: None (Preliminary result)   Collection Time: 04/10/22  4:37 PM   Specimen: BLOOD  Result Value Ref Range Status   Specimen Description BLOOD BLOOD LEFT ARM  Final   Special Requests   Final    BOTTLES DRAWN AEROBIC AND ANAEROBIC Blood Culture adequate volume   Culture   Final    NO GROWTH 4 DAYS Performed at [facility], [address], [place], [place] [address]    Report Status PENDING  Incomplete  MRSA Next Gen by PCR, Nasal     Status: None   Collection Time: 04/10/22  6:53 PM   Specimen: Nasal Mucosa; Nasal Swab  Result Value Ref Range Status   MRSA by PCR Next Gen NOT DETECTED NOT DETECTED Final    Comment: (NOTE) The GeneXpert MRSA Assay (FDA approved for NASAL specimens only), is one component of a comprehensive MRSA colonization surveillance program. It is not intended to diagnose MRSA infection nor to guide or monitor treatment for MRSA infections. Test performance is not FDA approved in patients less than 82 years old. Performed at [facility] [place], [place] [address]   C Difficile Quick Screen w PCR reflex     Status: None   Collection Time: 04/12/22 10:00 AM   Specimen: STOOL  Result Value Ref Range Status   C Diff antigen NEGATIVE NEGATIVE Final   C Diff toxin NEGATIVE NEGATIVE Final   C Diff interpretation No C. difficile detected.  Final    Comment: Performed at [facility], [place], [place] [address]  Respiratory (~20 pathogens) panel by PCR     Status: None   Collection Time: 04/13/22  5:15 AM   Specimen: Nasopharyngeal Swab; Respiratory  Result Value Ref Range Status   Adenovirus NOT DETECTED NOT DETECTED Final   Coronavirus 229E NOT DETECTED NOT  DETECTED Final    Comment: (NOTE) The Coronavirus on the Respiratory Panel, DOES NOT test for the novel  Coronavirus (2019 nCoV)    Coronavirus HKU1 NOT DETECTED NOT DETECTED Final   Coronavirus NL63 NOT DETECTED NOT DETECTED Final   Coronavirus OC43 NOT DETECTED NOT DETECTED Final   Metapneumovirus NOT DETECTED NOT DETECTED Final   Rhinovirus / Enterovirus NOT DETECTED NOT DETECTED Final   Influenza A NOT DETECTED NOT DETECTED Final   Influenza B NOT DETECTED NOT DETECTED Final   Parainfluenza Virus 1 NOT DETECTED NOT DETECTED Final   Parainfluenza Virus 2 NOT DETECTED NOT DETECTED Final   Parainfluenza Virus 3 NOT DETECTED NOT DETECTED Final   Parainfluenza Virus 4 NOT DETECTED NOT DETECTED Final  Respiratory Syncytial Virus NOT DETECTED NOT DETECTED Final   Bordetella pertussis NOT DETECTED NOT DETECTED Final   Bordetella Parapertussis NOT DETECTED NOT DETECTED Final   Chlamydophila pneumoniae NOT DETECTED NOT DETECTED Final   Mycoplasma pneumoniae NOT DETECTED NOT DETECTED Final    Comment: Performed at [facility], [place] [address] [place], [place] [address]    Please note: You were cared for by a hospitalist during your hospital stay. Once you are discharged, your primary care physician will handle any further medical issues. Please note that NO REFILLS for any discharge medications will be authorized once you are discharged, as it is imperative that you return to your primary care physician (or establish a relationship with a primary care physician if you do not have one) for your post hospital discharge needs so that they can reassess your need for medications and monitor your lab values.    Time coordinating discharge: 40 minutes  SIGNED:   Shelly Coss, MD  [facility] 04/15/2022, 8:32 AM Pager [phone]  If 7PM-7AM, please contact night-coverage [email or web address] Password [email or web address]

## 2022-04-16 DIAGNOSIS — R9401 Abnormal electroencephalogram [EEG]: Secondary | ICD-10-CM | POA: Diagnosis not present

## 2022-04-16 DIAGNOSIS — R4182 Altered mental status, unspecified: Secondary | ICD-10-CM | POA: Diagnosis not present

## 2022-04-16 DIAGNOSIS — R569 Unspecified convulsions: Secondary | ICD-10-CM | POA: Diagnosis not present

## 2022-04-16 LAB — GLUCOSE, CAPILLARY
Glucose-Capillary: 93 mg/dL (ref 70–99)
Glucose-Capillary: 183 mg/dL — ABNORMAL HIGH (ref 70–99)

## 2022-04-16 MED ORDER — LEVETIRACETAM 250 MG PO TABS: 250.0000 mg | ORAL_TABLET | ORAL | 0 refills | Status: DC

## 2022-04-16 MED ORDER — METOPROLOL SUCCINATE ER 100 MG PO TB24: 100.0000 mg | ORAL_TABLET | Freq: Every day | ORAL | Status: DC

## 2022-04-16 MED ORDER — LEVETIRACETAM 500 MG PO TABS: 500.0000 mg | ORAL_TABLET | Freq: Every day | ORAL | 1 refills | Status: DC

## 2022-04-16 MED ORDER — ISOSORB DINITRATE-HYDRALAZINE 20-37.5 MG PO TABS: 1.0000 | ORAL_TABLET | Freq: Three times a day (TID) | ORAL | 0 refills | Status: AC

## 2022-04-16 NOTE — Procedures (Signed)
Routine EEG Report  Joseph Mcpherson is a 76 y.o. male with a history of altered mental status and myoclonus who is undergoing an EEG to evaluate for seizures.  Report: This EEG was acquired with electrodes placed according to the International 10-20 electrode system (including Fp1, Fp2, F3, F4, C3, C4, P3, P4, O1, O2, T3, T4, T5, T6, A1, A2, Fz, Cz, Pz). The following electrodes were missing or displaced: none.  The occipital dominant rhythm was 8.5 Hz with intermittent diffuse slowing. This activity is reactive to stimulation. Drowsiness was manifested by background fragmentation; deeper stages of sleep were identified by K complexes and sleep spindles. There was no focal slowing. There were no interictal epileptiform discharges. There were no electrographic seizures identified. Photic stimulation and hyperventilation were not performed.   Impression and clinical correlation: This EEG was obtained while awake and asleep and is abnormal due to intermittent mild diffuse slowing indicative of global cerebral dysfunction. Epileptiform abnormalities were not seen during this recording.  Su Monks, MD [facility] [phone]  If 7pm- 7am, please page neurology on call as listed in [place].

## 2022-04-16 NOTE — Discharge Summary (Signed)
Physician Discharge Summary  Joseph Mcpherson AUQ:[id number] DOB: [birth year] DOA: 04/10/2022  PCP: [name], MD  Admit date: 04/10/2022 Discharge date: 04/16/2022  Admitted From: Home Disposition:  Transfer to [facility] for video EEG  Discharge Condition:Stable CODE STATUS:FULL Diet recommendation: Heart Healthy Brief/Interim Summary:  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 Recently hospitalized w/ COVID pneumonia 03/22/22-03/26/22, and for NSTEMI 03/09/22-03/13/22.  He initially presented with shortness of breath with hypoxia presentation.  Chest x-ray was concerning for pulm edema versus pneumonia with elevated BNP.  Patient was admitted for the management of acute hypoxic respiratory secondary to volume overload in addition to HCAP.  Since troponin was elevated, cardiology also consulted.  Nephrology was consulted for dialysis.  Currently his respiratory status has improved and he is on room air.  On 11/20, he was noted to have some jerky type/clonic movements.  CT head did not show any acute findings.  Concern for myoclonus.  EEG shows abnormality, startied Keppra.  Plan was to transfer to [facility] for continuous EEG, but follow-up EEG showed improvement without signs of seizure so transfer canceled.  He remains alert and oriented, on room air.  Medically stable for discharge home today.  He will follow-up with neurology as an outpatient.  She  Following  problems were addressed during his hospitalization:   Seizure w/ myoclonus and abnormal EEG Metabolic encephalopathy CT head no concerns Neurology consulted --> abnormal EEG: showed -Intermittent focal slowing, at times rhythmic, over the left posterior region indicative of focal cerebral dysfunction in that area.Frequent sharp waves (focal L  parietal, less often generalized) indicating increased epileptogenicity. Frequent myoclonus Repeat EEG showed improvement.  No need of  video EEG as per neurology.   Started Keppra  Follow-up with neurology as an outpatient    Acute respiratory failure with hypoxia ([place]) Acute HFrEF (heart failure with reduced ejection fraction) (HCC) Ddx: fluid overload in setting of HFrEF, possible HCAP Initially treating presumptive HCAP: but given improvement post-dialysis, pneumonia can be ruled OUT , was started on abx , now d/ced Treated fluid overload HFrEF given (+)rales and pulmonary edema on CXR, known pleural effusion Improvement post-dialysis Recent echocardiogram, will not repeat Currently on room air   Leukocytosis - resolved, likely reactive Loose stool in setting of recent hospitalization - resolved No further BM and WBC improving  BCx NG x2d   CAD (coronary artery disease) NSTEMI treatment but cardiology feels elevated troponins more likely d/t demand ischemia No chest pain  Continue BP control, ASA, Statin, Plavix  Cardiology consulted,got heparin gtt 48h, not candidate for intervention at this time  Cardiology were following  End stage renal disease ([place]) HD per nephrology team  Dialysis on Tuesday, Thursday, Saturday   Essential hypertension Home meds Continue current medications   Insulin dependent type 2 diabetes mellitus ([place]) Continue home regimen  Monitor blood sugars   Hypothyroidism w/ low TSH   TSH found to be low, T4 is slightly high, pending free T3 Most likely  euthyroid sick syndrome and his home levothyroxine should be continued   Mixed hyperlipidemia Continue statin    Dementia ([place]) Continue donepezil    Hx CVA Continue BP control, ASA, Statin, Plavix    Anemia due to chronic kidney disease [place]    Discharge Diagnoses:  Principal Problem:   Seizure (  [place]) Active Problems:   Acute respiratory failure with hypoxia (HCC)   End stage renal  disease (HCC)   Essential hypertension   Insulin dependent type 2 diabetes mellitus ([place])   Hypothyroidism   Mixed hyperlipidemia   Dementia (HCC)   CAD (coronary artery disease)   Leukocytosis   Anemia due to chronic kidney disease   Acute HFrEF (heart failure with reduced ejection fraction) (HCC)   Hypoxic respiratory failure (HCC)   Myoclonus   Abnormal EEG   Altered mental status    Discharge Instructions  Discharge Instructions     Diet - low sodium heart healthy   Complete by: As directed    Discharge instructions   Complete by: As directed    1)Please take prescribed medications as instructed 2)Follow up with you PCP in a week 3)You will be called by neurology for follow up appointment   Increase activity slowly   Complete by: As directed    No wound care   Complete by: As directed    No wound care   Complete by: As directed       Allergies as of 04/16/2022   No Known Allergies      Medication List     STOP taking these medications    chlorproMAZINE 25 MG tablet Commonly known as: THORAZINE   gabapentin 300 MG capsule Commonly known as: NEURONTIN   isosorbide mononitrate 30 MG 24 hr tablet Commonly known as: IMDUR   multivitamin with minerals tablet   predniSONE 10 MG tablet Commonly known as: DELTASONE       TAKE these medications    albuterol (2.5 MG/3ML) 0.083% nebulizer solution Commonly known as: PROVENTIL Take 3 mLs (2.5 mg total) by nebulization every 6 (six) hours as needed for wheezing or shortness of breath.   amLODipine 10 MG tablet Commonly known as: NORVASC Take 1 tablet (10 mg total) by mouth daily.   aspirin EC 81 MG tablet Take 81 mg by mouth daily. Swallow whole.   atorvastatin 40 MG tablet Commonly known as: LIPITOR Take 1 tablet (40 mg total) by mouth daily.   BD Pen Needle Nano 2nd Gen 32G X 4 MM Misc Generic drug: Insulin Pen Needle USE AS DIRECTED   calcium acetate 667 MG capsule Commonly known as:  PHOSLO TAKE 2 CAPSULES BY MOUTH 3 TIMES A DAY WITH MEAL   clopidogrel 75 MG tablet Commonly known as: PLAVIX Take 1 tablet (75 mg total) by mouth daily.   donepezil 10 MG tablet Commonly known as: ARICEPT TAKE 1 TABLET BY MOUTH EVERY DAY   FreeStyle Libre 14 Day Sensor Misc APPLY EVERY 14 (FOURTEEN) DAYS.   insulin detemir 100 UNIT/ML injection Commonly known as: LEVEMIR Inject 0.15 mLs (15 Units total) into the skin at bedtime.   ipratropium-albuterol 0.5-2.5 (3) MG/3ML Soln Commonly known as: DUONEB Take 3 mLs by nebulization every 6 (six) hours as needed.   isosorbide-hydrALAZINE 20-37.5 MG tablet Commonly known as: BIDIL Take 1 tablet by mouth 3 (three) times daily.   levETIRAcetam 500 MG tablet Commonly known as: Keppra Take 1 tablet (500 mg total) by mouth daily.   levETIRAcetam 250 MG tablet Commonly known as: Keppra Take 1 tablet (250 mg total) by mouth 3 (three) times a week. On Tuesday ,Thursday and Saturday after Hemodialysis Start taking on: April 17, 2022   levothyroxine 137 MCG tablet Commonly known as: SYNTHROID TAKE 1 TABLET BY MOUTH EVERY DAY   losartan 100 MG tablet Commonly known as: COZAAR Take  1 tablet (100 mg total) by mouth every evening.   metoprolol succinate 100 MG 24 hr tablet Commonly known as: TOPROL-XL Take 1 tablet (100 mg total) by mouth daily. Take with or immediately following a meal. What changed:  medication strength how much to take additional instructions   pantoprazole 40 MG tablet Commonly known as: PROTONIX TAKE 1 TABLET BY MOUTH EVERY DAY   Rena-Vite Rx 1 MG Tabs Take 1 tablet by mouth daily. What changed: Another medication with the same name was added. Make sure you understand how and when to take each.   multivitamin Tabs tablet Take 1 tablet by mouth daily. What changed: You were already taking a medication with the same name, and this prescription was added. Make sure you understand how and when to take  each.   sevelamer carbonate 2.4 g Pack Commonly known as: RENVELA Take 2.4 g by mouth daily.   sitaGLIPtin 50 MG tablet Commonly known as: JANUVIA Take 50 mg by mouth every evening.   sodium chloride flush 0.9 % Soln Commonly known as: NS Inject 5 mLs into the vein daily. Flush cholecystostomy tube daily with 5 ccs of NS   Vitamin D3 125 MCG (5000 UT) Tabs Take 5,000 Units by mouth daily.        No Known Allergies  Consultations: Cardiology, nephrology, neurology   Procedures/Studies: EEG adult  Result Date: 2022-04-19 Derek Jack, MD     2022/04/19  9:04 AM Routine EEG Report Joseph Mcpherson is a 76 y.o. male with a history of altered mental status and myoclonus who is undergoing an EEG to evaluate for seizures. Report: This EEG was acquired with electrodes placed according to the International 10-20 electrode system (including Fp1, Fp2, F3, F4, C3, C4, P3, P4, O1, O2, T3, T4, T5, T6, A1, A2, Fz, Cz, Pz). The following electrodes were missing or displaced: none. The occipital dominant rhythm was 8.5 Hz with intermittent diffuse slowing. This activity is reactive to stimulation. Drowsiness was manifested by background fragmentation; deeper stages of sleep were identified by K complexes and sleep spindles. There was no focal slowing. There were no interictal epileptiform discharges. There were no electrographic seizures identified. Photic stimulation and hyperventilation were not performed. Impression and clinical correlation: This EEG was obtained while awake and asleep and is abnormal due to intermittent mild diffuse slowing indicative of global cerebral dysfunction. Epileptiform abnormalities were not seen during this recording. Su Monks, MD [facility] [phone] If 7pm- 7am, please page neurology on call as listed in [place].   MR BRAIN WO CONTRAST  Result Date: 04/14/2022 CLINICAL DATA:  Myoclonus, stroke suspected EXAM: MRI HEAD WITHOUT CONTRAST TECHNIQUE:  Multiplanar, multiecho pulse sequences of the brain and surrounding structures were obtained without intravenous contrast. COMPARISON:  05/04/2019 MRI head, correlation is also made with 04/13/2022 CT head FINDINGS: Brain: No restricted diffusion to suggest acute or subacute infarct. No acute hemorrhage, mass, mass effect, or midline shift. No hydrocephalus or extra-axial collection. Ventricular size is commensurate with degree of cerebral and cerebellar atrophy. Redemonstrated medial occipital encephalomalacia. Scattered T2 hyperintense signal in the periventricular white matter, likely the sequela of chronic small vessel ischemic disease. The hippocampi are symmetric in size and signal. No heterotopia or evidence of cortical dysgenesis. Vascular: Normal arterial flow voids. Skull and upper cervical spine: Normal marrow signal. Sinuses/Orbits: Mucous retention cysts in the right greater than left maxillary sinus and left anterior ethmoid air cells. Status post bilateral lens replacements. Other: Fluid in the left mastoid air  cells. IMPRESSION: No acute intracranial process. No evidence of acute or subacute infarct. Electronically Signed   By: Merilyn Baba M.D.   On: 04/14/2022 22:00   EEG adult  Result Date: 04/13/2022 Derek Jack, MD     04/13/2022  8:02 PM Routine EEG Report Joseph Mcpherson is a 76 y.o. male with a history of myoclonus and altered mental status who is undergoing an EEG to evaluate for seizures. Report: This EEG was acquired with electrodes placed according to the International 10-20 electrode system (including Fp1, Fp2, F3, F4, C3, C4, P3, P4, O1, O2, T3, T4, T5, T6, A1, A2, Fz, Cz, Pz). The following electrodes were missing or displaced: none. The best background was 6-7 Hz with intermittent focal slowing on the left. This activity is reactive to stimulation. Drowsiness was manifested by background fragmentation; deeper stages of sleep were not identified. There were frequent sharp waves,  both generalized and focal over the left parietal region. The majority of these do not have a triphasic morphology. At times the focal sharp waves become briefly periodic up to 2 Hz. There are also runs of rhythmic delta best formed over the left hemisphere that last <10 seconds and rarely show mild evolution. Per technician notes he at times stops following commands during the study. By video his focal myoclonus events are not necessarily correlated to EEG change and frequently no observable myoclonus is seen during the EEG abnormalities. Aside from the electrographic abnormalities already described, there were no other epileptiform abnormalities or definitive electrographic seizures. Photic stimulation and hyperventilation were not performed. Impression and clinical correlation: This EEG was obtained while awake and drowsy and is abnormal due to: - Mild diffuse slowing indicative of global cerebral dysfunction - Intermittent focal slowing, at times rhythmic, over the left posterior region indicative of focal cerebral dysfunction in that area - Frequent sharp waves (focal L parietal, less often generalized) indicating increased epileptogenicity. The majority of these do not display triphasic morphology (ie not favored to be secondary to metabolic derangement). - Frequent clinical myoclonus by video, at times diffuse and at times multifocal, that does not necessarily correlate with his EEG abnormalities Transfer to [facility] for continuous EEG is recommended. Discussed with Dr. Sheppard Coil and Dr. Lorrin Goodell. Su Monks, MD [facility] [phone] If 7pm- 7am, please page neurology on call as listed in [place].   CT HEAD WO CONTRAST (5MM)  Result Date: 04/13/2022 CLINICAL DATA:  Mental status change.  Unknown cause. EXAM: CT HEAD WITHOUT CONTRAST TECHNIQUE: Contiguous axial images were obtained from the base of the skull through the vertex without intravenous contrast. RADIATION DOSE REDUCTION:  This exam was performed according to the departmental dose-optimization program which includes automated exposure control, adjustment of the mA and/or kV according to patient size and/or use of iterative reconstruction technique. COMPARISON:  CT head dated January 18, 2022 FINDINGS: Brain: No evidence of acute infarction, hemorrhage, hydrocephalus, extra-axial collection or mass lesion/mass effect. Prominence of the ventricles and sulci secondary to generalized cerebral atrophy, unchanged. Patchy areas of low-attenuation of the periventricular white matter presumed chronic microvascular ischemic changes. Vascular: No hyperdense vessel or unexpected calcification. Skull: Normal. Negative for fracture or focal lesion. Sinuses/Orbits: No acute finding. Other: None. IMPRESSION: 1. No acute intracranial abnormality. 2. Stable generalized cerebral atrophy and chronic microvascular ischemic changes of the white matter. Electronically Signed   By: Keane Police D.O.   On: 04/13/2022 14:15   DG Chest Portable 1 View  Result Date: 04/10/2022 CLINICAL DATA:  Shortness  of breath EXAM: PORTABLE CHEST 1 VIEW COMPARISON:  03/22/2022 FINDINGS: Right-sided tunneled central venous catheter with the tip in the right atrium. Small right pleural effusion. No pneumothorax. Unchanged cardiac and mediastinal contours. Compared to prior exam there are new linear opacities in the right lung base, which are nonspecific, and could represent atelectasis, but infection could have a similar appearance. Prominent interstitial opacities with mild peribronchial cuffing, could suggest a component of mild pulmonary edema. No displaced rib fractures. There is a left axillary vascular stent in place. IMPRESSION: 1. New patchy opacities in the right lung base are nonspecific, and could represent atelectasis, but infection could have a similar appearance. 2. Trace right pleural effusion. 3. Prominent interstitial opacities with mild peribronchial  cuffing, could suggest a component of mild pulmonary edema. Electronically Signed   By: Marin Roberts M.D.   On: 04/10/2022 15:36   CT CHEST WO CONTRAST  Result Date: 03/22/2022 CLINICAL DATA:  Pneumonia. EXAM: CT CHEST WITHOUT CONTRAST TECHNIQUE: Multidetector CT imaging of the chest was performed following the standard protocol without IV contrast. RADIATION DOSE REDUCTION: This exam was performed according to the departmental dose-optimization program which includes automated exposure control, adjustment of the mA and/or kV according to patient size and/or use of iterative reconstruction technique. COMPARISON:  Two-view chest x-ray 03/22/2022. FINDINGS: Cardiovascular: Is mildly enlarged. Dense coronary artery calcifications are present. Atherosclerotic changes are present at the aortic arch and great vessel origins without definite stenosis. A right IJ dialysis catheter terminates in the right atrium. Calcifications are present at the aortic valve. No significant pericardial effusion is present. Mediastinum/Nodes: Paratracheal lymph nodes measure up to 11 mm in short access. No significant hilar or axillary adenopathy is present. Lungs/Pleura: Moderate bilateral pleural effusions are present. Dependent atelectasis is associated. Additional peribronchial ground-glass attenuation is present in the lingula and to a lesser extent non dependent left lower lobe. More subtle airspace opacities are present in the right upper lobe. Minimal consolidation is present in the right middle lobe. Airways are patent. Upper Abdomen: Cholecystostomy tube noted. Atherosclerotic changes are present. Upper abdomen is otherwise unremarkable. Musculoskeletal: No acute or focal osseous abnormalities are present. IMPRESSION: 1. Moderate bilateral pleural effusions with associated dependent atelectasis. 2. Peribronchial ground-glass attenuation in the lingula and to a lesser extent non dependent left lower lobe. More subtle airspace  opacities are present in the right upper lobe. Findings are concerning for multifocal pneumonia. 3. Cholecystostomy tube noted. 4. Coronary artery disease. 5. Right IJ dialysis catheter terminates in the right atrium. 6.  Aortic Atherosclerosis (ICD10-I70.0). Electronically Signed   By: San Morelle M.D.   On: 03/22/2022 15:08   DG Chest 2 View  Result Date: 03/22/2022 CLINICAL DATA:  Shortness of breath EXAM: CHEST - 2 VIEW COMPARISON:  Chest radiograph dated 03/09/2022. FINDINGS: The heart size and mediastinal contours are within normal limits. Vascular calcifications are seen in the aortic arch. There is a small left pleural effusion with associated atelectasis. The right lung is clear and there is no right pleural effusion. No pneumothorax on either side. A right internal jugular central venous catheter tip overlies the right atrium. A vascular stent overlies the left axilla. A pigtail catheter overlies the gallbladder fossa. Degenerative changes are seen in the spine. IMPRESSION: 1. Small left pleural effusion with associated atelectasis. 2. Right internal jugular central venous catheter tip overlies the right atrium. Electronically Signed   By: Zerita Boers M.D.   On: 03/22/2022 11:56   IR CHOLANGIOGRAM EXISTING TUBE  Result  Date: 03/18/2022 INDICATION: 76 year old with history of acalculous cholecystitis. Cholecystostomy tube was placed on 01/21/2022. Patient is being evaluated for cholecystectomy and request for a cholangiogram. EXAM: CHOLANGIOGRAM THROUGH EXISTING CATHETER MEDICATIONS: None ANESTHESIA/SEDATION: None FLUOROSCOPY: Radiation Exposure Index (as provided by the fluoroscopic device): 5.9 mGy Kerma COMPLICATIONS: None immediate. PROCEDURE: Patient was placed supine on the interventional table. 7 mL Omnipaque 300 was injected through the cholecystostomy tube. The tube was then flushed with normal saline. By patient's request, we attached the tube back to a gravity bag.  Cholecystostomy tube was redressed. Fluoroscopic images were taken and saved for this procedure. FINDINGS: Pigtail portion of the drain is positioned in the gallbladder fundus. Radiopaque marker is probably external to the gallbladder. The gallbladder is decompressed. There is immediate opacification of the cystic duct and common bile duct. Contrast rapidly drains into the duodenum. No large filling defects or obstructing stones. IMPRESSION: 1. Cholecystostomy tube is slightly retracted but still appropriately positioned within the gallbladder. 2. Gallbladder is decompressed. Cystic duct and common bile duct are widely patent. Discussed a capping trial with the patient and wife but they preferred to continue with gravity drainage. Patient is scheduled to follow-up with general surgery. Electronically Signed   By: Markus Daft M.D.   On: 03/18/2022 13:12      Subjective: Patient seen and examined at bedside today.  Hemodynamically stable.  Sitting in the chair.  On room air.  No new complaints.  Alert and oriented.  Family at bedside.  No new complaints.  Patient very eager to go home  Discharge Exam: Vitals:   04/16/22 0759 04/16/22 1142  BP: (!) 148/47 96/71  Pulse: 63 61  Resp:  18  Temp: 97.6 F (36.4 C) 98 F (36.7 C)  SpO2: 100% 100%   Vitals:   04/16/22 0323 04/16/22 0455 04/16/22 0759 04/16/22 1142  BP: (!) 167/52  (!) 148/47 96/71  Pulse: 60  63 61  Resp: 18   18  Temp: 98 F (36.7 C)  97.6 F (36.4 C) 98 F (36.7 C)  TempSrc: Oral   Oral  SpO2: 100%  100% 100%  Weight:  62.1 kg    Height:        General: Pt is alert, awake, not in acute distress,pleasant male Cardiovascular: RRR, S1/S2 +, no rubs, no gallops, hemodialysis catheter on the right chest Respiratory: Mildly diminished air entry bilaterally, no wheezing, no rhonchi Abdominal: Soft, NT, ND, bowel sounds + Extremities: no edema, no cyanosis    The results of significant diagnostics from this hospitalization  (including imaging, microbiology, ancillary and laboratory) are listed below for reference.     Microbiology: Recent Results (from the past 240 hour(s))  Resp Panel by RT-PCR (Flu A&B, Covid) Anterior Nasal Swab     Status: None   Collection Time: 04/10/22  3:25 PM   Specimen: Anterior Nasal Swab  Result Value Ref Range Status   SARS Coronavirus 2 by RT PCR NEGATIVE NEGATIVE Final    Comment: (NOTE) SARS-CoV-2 target nucleic acids are NOT DETECTED.  The SARS-CoV-2 RNA is generally detectable in upper respiratory specimens during the acute phase of infection. The lowest concentration of SARS-CoV-2 viral copies this assay can detect is 138 copies/mL. A negative result does not preclude SARS-Cov-2 infection and should not be used as the sole basis for treatment or other patient management decisions. A negative result may occur with  improper specimen collection/handling, submission of specimen other than nasopharyngeal swab, presence of viral mutation(s) within the areas  targeted by this assay, and inadequate number of viral copies(<138 copies/mL). A negative result must be combined with clinical observations, patient history, and epidemiological information. The expected result is Negative.  Fact Sheet for Patients:  EntrepreneurPulse.com.au  Fact Sheet for Healthcare Providers:  IncredibleEmployment.be  This test is no t yet approved or cleared by the [place] FDA and  has been authorized for detection and/or diagnosis of SARS-CoV-2 by FDA under an Emergency Use Authorization (EUA). This EUA will remain  in effect (meaning this test can be used) for the duration of the COVID-19 declaration under Section 564(b)(1) of the Act, 21 U.S.C.section 360bbb-3(b)(1), unless the authorization is terminated  or revoked sooner.       Influenza A by PCR NEGATIVE NEGATIVE Final   Influenza B by PCR NEGATIVE NEGATIVE Final    Comment: (NOTE) The  Xpert Xpress SARS-CoV-2/FLU/RSV plus assay is intended as an aid in the diagnosis of influenza from Nasopharyngeal swab specimens and should not be used as a sole basis for treatment. Nasal washings and aspirates are unacceptable for Xpert Xpress SARS-CoV-2/FLU/RSV testing.  Fact Sheet for Patients: EntrepreneurPulse.com.au  Fact Sheet for Healthcare Providers: IncredibleEmployment.be  This test is not yet approved or cleared by the [place] FDA and has been authorized for detection and/or diagnosis of SARS-CoV-2 by FDA under an Emergency Use Authorization (EUA). This EUA will remain in effect (meaning this test can be used) for the duration of the COVID-19 declaration under Section 564(b)(1) of the Act, 21 U.S.C. section 360bbb-3(b)(1), unless the authorization is terminated or revoked.  Performed at [facility], [address] [place], [place] [address]   Blood culture (routine x 2)     Status: None   Collection Time: 04/10/22  4:37 PM   Specimen: BLOOD  Result Value Ref Range Status   Specimen Description BLOOD BLOOD LEFT ARM  Final   Special Requests   Final    BOTTLES DRAWN AEROBIC AND ANAEROBIC Blood Culture adequate volume   Culture   Final    NO GROWTH 5 DAYS Performed at [facility], [address] [place], [place] [address]    Report Status 04/15/2022 FINAL  Final  Blood culture (routine x 2)     Status: None   Collection Time: 04/10/22  4:37 PM   Specimen: BLOOD  Result Value Ref Range Status   Specimen Description BLOOD BLOOD LEFT ARM  Final   Special Requests   Final    BOTTLES DRAWN AEROBIC AND ANAEROBIC Blood Culture adequate volume   Culture   Final    NO GROWTH 5 DAYS Performed at [facility], [address] [place], [place] [address]    Report Status 04/15/2022 FINAL  Final  MRSA Next Gen by PCR, Nasal     Status: None   Collection Time: 04/10/22  6:53 PM   Specimen: Nasal  Mucosa; Nasal Swab  Result Value Ref Range Status   MRSA by PCR Next Gen NOT DETECTED NOT DETECTED Final    Comment: (NOTE) The GeneXpert MRSA Assay (FDA approved for NASAL specimens only), is one component of a comprehensive MRSA colonization surveillance program. It is not intended to diagnose MRSA infection nor to guide or monitor treatment for MRSA infections. Test performance is not FDA approved in patients less than 94 years old. Performed at [facility], [address] [place], [place] [address]   C Difficile Quick Screen w PCR reflex     Status: None   Collection  Time: 04/12/22 10:00 AM   Specimen: STOOL  Result Value Ref Range Status   C Diff antigen NEGATIVE NEGATIVE Final   C Diff toxin NEGATIVE NEGATIVE Final   C Diff interpretation No C. difficile detected.  Final    Comment: Performed at [facility], [address] [place], [place] [address]  Respiratory (~20 pathogens) panel by PCR     Status: None   Collection Time: 04/13/22  5:15 AM   Specimen: Nasopharyngeal Swab; Respiratory  Result Value Ref Range Status   Adenovirus NOT DETECTED NOT DETECTED Final   Coronavirus 229E NOT DETECTED NOT DETECTED Final    Comment: (NOTE) The Coronavirus on the Respiratory Panel, DOES NOT test for the novel  Coronavirus (2019 nCoV)    Coronavirus HKU1 NOT DETECTED NOT DETECTED Final   Coronavirus NL63 NOT DETECTED NOT DETECTED Final   Coronavirus OC43 NOT DETECTED NOT DETECTED Final   Metapneumovirus NOT DETECTED NOT DETECTED Final   Rhinovirus / Enterovirus NOT DETECTED NOT DETECTED Final   Influenza A NOT DETECTED NOT DETECTED Final   Influenza B NOT DETECTED NOT DETECTED Final   Parainfluenza Virus 1 NOT DETECTED NOT DETECTED Final   Parainfluenza Virus 2 NOT DETECTED NOT DETECTED Final   Parainfluenza Virus 3 NOT DETECTED NOT DETECTED Final   Parainfluenza Virus 4 NOT DETECTED NOT DETECTED Final   Respiratory Syncytial Virus NOT DETECTED NOT DETECTED  Final   Bordetella pertussis NOT DETECTED NOT DETECTED Final   Bordetella Parapertussis NOT DETECTED NOT DETECTED Final   Chlamydophila pneumoniae NOT DETECTED NOT DETECTED Final   Mycoplasma pneumoniae NOT DETECTED NOT DETECTED Final    Comment: Performed at [facility], [place], [place], [place] [address]     Labs: BNP (last 3 results) Recent Labs    03/09/22 1901 03/22/22 1221 03/31/22 1700  BNP 2,687.5* >4,500.0* 0,981.1*   Basic Metabolic Panel: Recent Labs  Lab 04/13/22 1033 04/13/22 1818 04/14/22 0526 04/14/22 1755 04/15/22 0438  NA 137 136 137 136 134*  K 5.6* 5.6* 6.2* 4.2 4.1  CL 104 102 105 97* 98  CO2 '27 23 24 31 28  '$ GLUCOSE 120* 113* 91 130* 103*  BUN 40* 44* 47* 18 23  CREATININE 8.68* 9.27* 9.86* 4.78* 5.66*  CALCIUM 9.2 9.1 9.4 9.1 9.3  MG 2.1  --   --   --   --   PHOS  --   --  2.6  --   --    Liver Function Tests: Recent Labs  Lab 04/11/22 0608 04/13/22 2139 04/14/22 0526  AST 16 14*  --   ALT 10 8  --   ALKPHOS 110 103  --   BILITOT 1.2 0.8  --   PROT 6.0* 5.8*  --   ALBUMIN 2.6* 2.4* 2.5*   No results for input(s): "LIPASE", "AMYLASE" in the last 168 hours. Recent Labs  Lab 04/13/22 2139  AMMONIA 29   CBC: Recent Labs  Lab 04/10/22 1525 04/10/22 1747 04/11/22 9147 04/12/22 0232 04/13/22 0540 04/14/22 0526 04/15/22 0438  WBC 17.3*   < > 10.1 7.3 9.6 6.9 6.5  NEUTROABS 15.6*  --   --   --   --  4.7  --   HGB 10.1*   < > 9.1* 9.4* 8.5* 9.0* 8.6*  HCT 33.3*   < > 29.5* 30.4* 28.3* 29.8* 27.8*  MCV 85.2   < > 84.8 85.2 85.5 85.4 84.5  PLT 184   < > 157 170  193 223 253   < > = values in this interval not displayed.   Cardiac Enzymes: No results for input(s): "CKTOTAL", "CKMB", "CKMBINDEX", "TROPONINI" in the last 168 hours. BNP: Invalid input(s): "POCBNP" CBG: Recent Labs  Lab 04/15/22 1243 04/15/22 1701 04/15/22 2130 04/16/22 0759 04/16/22 1145  GLUCAP 103* 154* 129* 93 183*   D-Dimer No results for  input(s): "DDIMER" in the last 72 hours. Hgb A1c No results for input(s): "HGBA1C" in the last 72 hours. Lipid Profile Recent Labs    04/15/22 0438  CHOL 120  HDL 42  LDLCALC 66  TRIG 60  CHOLHDL 2.9   Thyroid function studies No results for input(s): "TSH", "T4TOTAL", "T3FREE", "THYROIDAB" in the last 72 hours.  Invalid input(s): "FREET3" Anemia work up No results for input(s): "VITAMINB12", "FOLATE", "FERRITIN", "TIBC", "IRON", "RETICCTPCT" in the last 72 hours. Urinalysis    Component Value Date/Time   COLORURINE Straw 09/11/2013 1933   APPEARANCEUR Clear 09/11/2013 1933   LABSPEC 1.016 09/11/2013 1933   PHURINE 7.0 09/11/2013 1933   GLUCOSEU >=500 09/11/2013 1933   HGBUR Negative 09/11/2013 1933   BILIRUBINUR Negative 09/11/2013 1933   KETONESUR Negative 09/11/2013 1933   PROTEINUR 100 mg/dL 09/11/2013 1933   NITRITE Negative 09/11/2013 1933   LEUKOCYTESUR Negative 09/11/2013 1933   Sepsis Labs Recent Labs  Lab 04/12/22 0232 04/13/22 0540 04/14/22 0526 04/15/22 0438  WBC 7.3 9.6 6.9 6.5   Microbiology Recent Results (from the past 240 hour(s))  Resp Panel by RT-PCR (Flu A&B, Covid) Anterior Nasal Swab     Status: None   Collection Time: 04/10/22  3:25 PM   Specimen: Anterior Nasal Swab  Result Value Ref Range Status   SARS Coronavirus 2 by RT PCR NEGATIVE NEGATIVE Final    Comment: (NOTE) SARS-CoV-2 target nucleic acids are NOT DETECTED.  The SARS-CoV-2 RNA is generally detectable in upper respiratory specimens during the acute phase of infection. The lowest concentration of SARS-CoV-2 viral copies this assay can detect is 138 copies/mL. A negative result does not preclude SARS-Cov-2 infection and should not be used as the sole basis for treatment or other patient management decisions. A negative result may occur with  improper specimen collection/handling, submission of specimen other than nasopharyngeal swab, presence of viral mutation(s) within  the areas targeted by this assay, and inadequate number of viral copies(<138 copies/mL). A negative result must be combined with clinical observations, patient history, and epidemiological information. The expected result is Negative.  Fact Sheet for Patients:  EntrepreneurPulse.com.au  Fact Sheet for Healthcare Providers:  IncredibleEmployment.be  This test is no t yet approved or cleared by the [place] FDA and  has been authorized for detection and/or diagnosis of SARS-CoV-2 by FDA under an Emergency Use Authorization (EUA). This EUA will remain  in effect (meaning this test can be used) for the duration of the COVID-19 declaration under Section 564(b)(1) of the Act, 21 U.S.C.section 360bbb-3(b)(1), unless the authorization is terminated  or revoked sooner.       Influenza A by PCR NEGATIVE NEGATIVE Final   Influenza B by PCR NEGATIVE NEGATIVE Final    Comment: (NOTE) The Xpert Xpress SARS-CoV-2/FLU/RSV plus assay is intended as an aid in the diagnosis of influenza from Nasopharyngeal swab specimens and should not be used as a sole basis for treatment. Nasal washings and aspirates are unacceptable for Xpert Xpress SARS-CoV-2/FLU/RSV testing.  Fact Sheet for Patients: EntrepreneurPulse.com.au  Fact Sheet for Healthcare Providers: IncredibleEmployment.be  This test is not yet approved or  cleared by the [place] and has been authorized for detection and/or diagnosis of SARS-CoV-2 by FDA under an Emergency Use Authorization (EUA). This EUA will remain in effect (meaning this test can be used) for the duration of the COVID-19 declaration under Section 564(b)(1) of the Act, 21 U.S.C. section 360bbb-3(b)(1), unless the authorization is terminated or revoked.  Performed at [facility], [address] [place], [place] [address]   Blood culture (routine x 2)     Status: None    Collection Time: 04/10/22  4:37 PM   Specimen: BLOOD  Result Value Ref Range Status   Specimen Description BLOOD BLOOD LEFT ARM  Final   Special Requests   Final    BOTTLES DRAWN AEROBIC AND ANAEROBIC Blood Culture adequate volume   Culture   Final    NO GROWTH 5 DAYS Performed at [facility], [address], [place], [place] [address]    Report Status 04/15/2022 FINAL  Final  Blood culture (routine x 2)     Status: None   Collection Time: 04/10/22  4:37 PM   Specimen: BLOOD  Result Value Ref Range Status   Specimen Description BLOOD BLOOD LEFT ARM  Final   Special Requests   Final    BOTTLES DRAWN AEROBIC AND ANAEROBIC Blood Culture adequate volume   Culture   Final    NO GROWTH 5 DAYS Performed at [facility], [address] [place], [place] [address]    Report Status 04/15/2022 FINAL  Final  MRSA Next Gen by PCR, Nasal     Status: None   Collection Time: 04/10/22  6:53 PM   Specimen: Nasal Mucosa; Nasal Swab  Result Value Ref Range Status   MRSA by PCR Next Gen NOT DETECTED NOT DETECTED Final    Comment: (NOTE) The GeneXpert MRSA Assay (FDA approved for NASAL specimens only), is one component of a comprehensive MRSA colonization surveillance program. It is not intended to diagnose MRSA infection nor to guide or monitor treatment for MRSA infections. Test performance is not FDA approved in patients less than 55 years old. Performed at [facility], [place], [place] [address]   C Difficile Quick Screen w PCR reflex     Status: None   Collection Time: 04/12/22 10:00 AM   Specimen: STOOL  Result Value Ref Range Status   C Diff antigen NEGATIVE NEGATIVE Final   C Diff toxin NEGATIVE NEGATIVE Final   C Diff interpretation No C. difficile detected.  Final    Comment: Performed at [facility], [place], [place] [address]  Respiratory (~20 pathogens) panel by PCR     Status: None   Collection Time:  04/13/22  5:15 AM   Specimen: Nasopharyngeal Swab; Respiratory  Result Value Ref Range Status   Adenovirus NOT DETECTED NOT DETECTED Final   Coronavirus 229E NOT DETECTED NOT DETECTED Final    Comment: (NOTE) The Coronavirus on the Respiratory Panel, DOES NOT test for the novel  Coronavirus (2019 nCoV)    Coronavirus HKU1 NOT DETECTED NOT DETECTED Final   Coronavirus NL63 NOT DETECTED NOT DETECTED Final   Coronavirus OC43 NOT DETECTED NOT DETECTED Final   Metapneumovirus NOT DETECTED NOT DETECTED Final   Rhinovirus / Enterovirus NOT DETECTED NOT DETECTED Final   Influenza A NOT DETECTED NOT DETECTED Final   Influenza B NOT DETECTED NOT DETECTED Final   Parainfluenza Virus 1 NOT DETECTED NOT DETECTED Final   Parainfluenza Virus 2  NOT DETECTED NOT DETECTED Final   Parainfluenza Virus 3 NOT DETECTED NOT DETECTED Final   Parainfluenza Virus 4 NOT DETECTED NOT DETECTED Final   Respiratory Syncytial Virus NOT DETECTED NOT DETECTED Final   Bordetella pertussis NOT DETECTED NOT DETECTED Final   Bordetella Parapertussis NOT DETECTED NOT DETECTED Final   Chlamydophila pneumoniae NOT DETECTED NOT DETECTED Final   Mycoplasma pneumoniae NOT DETECTED NOT DETECTED Final    Comment: Performed at [facility], [place] [address] [place], [place] [address]    Please note: You were cared for by a hospitalist during your hospital stay. Once you are discharged, your primary care physician will handle any further medical issues. Please note that NO REFILLS for any discharge medications will be authorized once you are discharged, as it is imperative that you return to your primary care physician (or establish a relationship with a primary care physician if you do not have one) for your post hospital discharge needs so that they can reassess your need for medications and monitor your lab values.    Time coordinating discharge: 40 minutes  SIGNED:   Shelly Coss, MD  [facility] 04/16/2022, 2:17 PM Pager [phone]  If 7PM-7AM, please contact night-coverage [email or web address] Password [email or web address]

## 2022-04-16 NOTE — Progress Notes (Signed)
PROGRESS NOTE  Bohdan Macho  JKK:[id number] DOB: [birth year] DOA: 04/10/2022 PCP: [name], MD   Brief Narrative: 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 Recently hospitalized w/ COVID pneumonia 03/22/22-03/26/22, and for NSTEMI 03/09/22-03/13/22.  He initially presented with shortness of breath with hypoxia presentation.  Chest x-ray was concerning for pulm edema versus pneumonia with elevated BNP.  Patient was admitted for the management of acute hypoxic respiratory secondary to volume overload in addition to HCAP.  Since troponin was elevated, cardiology also consulted.  Nephrology was consulted for dialysis.  Currently his respiratory status has improved and he is on room air.  On 11/20, he was noted to have some jerky type/clonic movements.  CT head did not show any acute findings.  Concern for myoclonus.  EEG shows abnormality, starting Keppra.  Plan is to transfer to [facility] for continuous EEG.     Assessment & Plan:   Seizure w/ myoclonus and abnormal EEG Metabolic encephalopathy CT head no concerns Neurology consulted --> abnormal EEG: showed -Intermittent focal slowing, at times rhythmic, over the left posterior region indicative of focal cerebral dysfunction in that area.Frequent sharp waves (focal L parietal, less often generalized) indicating increased epileptogenicity. Frequent myoclonus started Keppra  transfer for continuous EEG Neurology aware, will follow him there. Currently he is completely alert and oriented.   Acute respiratory failure with hypoxia (HCC) Acute HFrEF (heart failure with reduced ejection fraction) (HCC) Ddx: fluid overload in setting of HFrEF, possible HCAP Initially treating presumptive HCAP: but given improvement post-dialysis, pneumonia can be ruled OUT  , was started on abx , now d/c Treat fluid overload HFrEF given (+)rales and pulmonary edema on CXR, known pleural effusion Improvement post-dialysis Recent echocardiogram, will not repeat I&O Continue supplemental O2 as needed - on room air as of 04/15/22    Leukocytosis - resolved, likely reactive Loose stool in setting of recent hospitalization - resolved Await CDiff screen but this seems unlikely, no further BM and WBC improving  BCx NG x2d   CAD (coronary artery disease) NSTEMI treatment but cardiology feels elevated troponins more likely d/t demand ischemia No chest pain  Continue BP control, ASA, Statin, Plavix  Cardiology consulted,got heparin gtt 48h, not candidate for intervention at this time  Cardiology were following   End stage renal disease ([place]) HD per nephrology team  Need to alert nephrology team when he gets into [facility]   Essential hypertension Home meds Continue current medications   Insulin dependent type 2 diabetes mellitus ([place]) SSI and basal insulin Monitor blood sugars   Hypothyroidism w/ low TSH  holding home levothyroxine for now( check when he gets to [facility]) Not sure why thyroid was checked during this admission.  TSH found to be low, T4 is slightly high, pending free T3 If free T3 does not correlate with TSH and free T4, most likely  euthyroid sick syndrome and his home levothyroxine should be continued   Mixed hyperlipidemia Continue statin    Dementia (HCC) Continue donepezil    Hx CVA Continue BP control, ASA, Statin, Plavix    Anemia due to chronic kidney disease Trend CBC Hb stable          Antimicrobials:  Anti-infectives (From admission, onward)    Start     Dose/Rate Route Frequency Ordered Stop  04/11/22 1800  ceFEPIme (MAXIPIME) 1 g in sodium chloride 0.9 % 100 mL IVPB  Status:  Discontinued        1 g 200 mL/hr over 30 Minutes Intravenous Every 24 hours 04/10/22 1829 04/12/22 1502   04/10/22 1829  vancomycin variable  dose per unstable renal function (pharmacist dosing)  Status:  Discontinued         Does not apply See admin instructions 04/10/22 1829 04/11/22 1352   04/10/22 1615  vancomycin (VANCOCIN) IVPB 1000 mg/200 mL premix  Status:  Discontinued        1,000 mg 200 mL/hr over 60 Minutes Intravenous  Once 04/10/22 1600 04/10/22 1614   04/10/22 1615  ceFEPIme (MAXIPIME) 2 g in sodium chloride 0.9 % 100 mL IVPB        2 g 200 mL/hr over 30 Minutes Intravenous  Once 04/10/22 1600 04/10/22 1711   04/10/22 1615  vancomycin (VANCOREADY) IVPB 1250 mg/250 mL        1,250 mg 166.7 mL/hr over 90 Minutes Intravenous  Once 04/10/22 1614 04/10/22 1852       Objective: Vitals:   04/16/22 0323 04/16/22 0455 04/16/22 0759 04/16/22 1142  BP: (!) 167/52  (!) 148/47 96/71  Pulse: 60  63 61  Resp: 18   18  Temp: 98 F (36.7 C)  97.6 F (36.4 C) 98 F (36.7 C)  TempSrc: Oral   Oral  SpO2: 100%  100% 100%  Weight:  62.1 kg    Height:       No intake or output data in the 24 hours ending 04/16/22 1422 Filed Weights   04/15/22 0900 04/15/22 1224 04/16/22 0455  Weight: 63.3 kg 61.6 kg 62.1 kg    Examination:  General exam: Overall comfortable, not in distress HEENT: PERRL Respiratory system:  no wheezes or crackles  Cardiovascular system: S1 & S2 heard, RRR.  Right-sided dialysis catheter Gastrointestinal system: Abdomen is nondistended, soft and nontender. Central nervous system: Alert and oriented Extremities: No edema, no clubbing ,no cyanosis Skin: No rashes, no ulcers,no icterus     Data Reviewed: I have personally reviewed following labs and imaging studies  CBC: Recent Labs  Lab 04/10/22 1525 04/10/22 1747 04/11/22 0608 04/12/22 0232 04/13/22 0540 04/14/22 0526 04/15/22 0438  WBC 17.3*   < > 10.1 7.3 9.6 6.9 6.5  NEUTROABS 15.6*  --   --   --   --  4.7  --   HGB 10.1*   < > 9.1* 9.4* 8.5* 9.0* 8.6*  HCT 33.3*   < > 29.5* 30.4* 28.3* 29.8* 27.8*  MCV 85.2   < > 84.8 85.2 85.5  85.4 84.5  PLT 184   < > 157 170 193 223 253   < > = values in this interval not displayed.   Basic Metabolic Panel: Recent Labs  Lab 04/13/22 1033 04/13/22 1818 04/14/22 0526 04/14/22 1755 04/15/22 0438  NA 137 136 137 136 134*  K 5.6* 5.6* 6.2* 4.2 4.1  CL 104 102 105 97* 98  CO2 '27 23 24 31 28  '$ GLUCOSE 120* 113* 91 130* 103*  BUN 40* 44* 47* 18 23  CREATININE 8.68* 9.27* 9.86* 4.78* 5.66*  CALCIUM 9.2 9.1 9.4 9.1 9.3  MG 2.1  --   --   --   --   PHOS  --   --  2.6  --   --      Recent Results (from the past 240 hour(s))  Resp Panel  by RT-PCR (Flu A&B, Covid) Anterior Nasal Swab     Status: None   Collection Time: 04/10/22  3:25 PM   Specimen: Anterior Nasal Swab  Result Value Ref Range Status   SARS Coronavirus 2 by RT PCR NEGATIVE NEGATIVE Final    Comment: (NOTE) SARS-CoV-2 target nucleic acids are NOT DETECTED.  The SARS-CoV-2 RNA is generally detectable in upper respiratory specimens during the acute phase of infection. The lowest concentration of SARS-CoV-2 viral copies this assay can detect is 138 copies/mL. A negative result does not preclude SARS-Cov-2 infection and should not be used as the sole basis for treatment or other patient management decisions. A negative result may occur with  improper specimen collection/handling, submission of specimen other than nasopharyngeal swab, presence of viral mutation(s) within the areas targeted by this assay, and inadequate number of viral copies(<138 copies/mL). A negative result must be combined with clinical observations, patient history, and epidemiological information. The expected result is Negative.  Fact Sheet for Patients:  EntrepreneurPulse.com.au  Fact Sheet for Healthcare Providers:  IncredibleEmployment.be  This test is no t yet approved or cleared by the [place] FDA and  has been authorized for detection and/or diagnosis of SARS-CoV-2 by FDA under an  Emergency Use Authorization (EUA). This EUA will remain  in effect (meaning this test can be used) for the duration of the COVID-19 declaration under Section 564(b)(1) of the Act, 21 U.S.C.section 360bbb-3(b)(1), unless the authorization is terminated  or revoked sooner.       Influenza A by PCR NEGATIVE NEGATIVE Final   Influenza B by PCR NEGATIVE NEGATIVE Final    Comment: (NOTE) The Xpert Xpress SARS-CoV-2/FLU/RSV plus assay is intended as an aid in the diagnosis of influenza from Nasopharyngeal swab specimens and should not be used as a sole basis for treatment. Nasal washings and aspirates are unacceptable for Xpert Xpress SARS-CoV-2/FLU/RSV testing.  Fact Sheet for Patients: EntrepreneurPulse.com.au  Fact Sheet for Healthcare Providers: IncredibleEmployment.be  This test is not yet approved or cleared by the [place] FDA and has been authorized for detection and/or diagnosis of SARS-CoV-2 by FDA under an Emergency Use Authorization (EUA). This EUA will remain in effect (meaning this test can be used) for the duration of the COVID-19 declaration under Section 564(b)(1) of the Act, 21 U.S.C. section 360bbb-3(b)(1), unless the authorization is terminated or revoked.  Performed at [facility], [place], [address] [place], [place] [address]   Blood culture (routine x 2)     Status: None   Collection Time: 04/10/22  4:37 PM   Specimen: BLOOD  Result Value Ref Range Status   Specimen Description BLOOD BLOOD LEFT ARM  Final   Special Requests   Final    BOTTLES DRAWN AEROBIC AND ANAEROBIC Blood Culture adequate volume   Culture   Final    NO GROWTH 5 DAYS Performed at [facility], [address], [place], [place] [address]    Report Status 04/15/2022 FINAL  Final  Blood culture (routine x 2)     Status: None   Collection Time: 04/10/22  4:37 PM   Specimen: BLOOD  Result Value Ref Range Status   Specimen  Description BLOOD BLOOD LEFT ARM  Final   Special Requests   Final    BOTTLES DRAWN AEROBIC AND ANAEROBIC Blood Culture adequate volume   Culture   Final    NO GROWTH 5 DAYS Performed at [facility], [address] [place], [place] [address]    Report  Status 04/15/2022 FINAL  Final  MRSA Next Gen by PCR, Nasal     Status: None   Collection Time: 04/10/22  6:53 PM   Specimen: Nasal Mucosa; Nasal Swab  Result Value Ref Range Status   MRSA by PCR Next Gen NOT DETECTED NOT DETECTED Final    Comment: (NOTE) The GeneXpert MRSA Assay (FDA approved for NASAL specimens only), is one component of a comprehensive MRSA colonization surveillance program. It is not intended to diagnose MRSA infection nor to guide or monitor treatment for MRSA infections. Test performance is not FDA approved in patients less than 15 years old. Performed at [facility], [place], [place] [address]   C Difficile Quick Screen w PCR reflex     Status: None   Collection Time: 04/12/22 10:00 AM   Specimen: STOOL  Result Value Ref Range Status   C Diff antigen NEGATIVE NEGATIVE Final   C Diff toxin NEGATIVE NEGATIVE Final   C Diff interpretation No C. difficile detected.  Final    Comment: Performed at [facility], [address] [place], [place] [address]  Respiratory (~20 pathogens) panel by PCR     Status: None   Collection Time: 04/13/22  5:15 AM   Specimen: Nasopharyngeal Swab; Respiratory  Result Value Ref Range Status   Adenovirus NOT DETECTED NOT DETECTED Final   Coronavirus 229E NOT DETECTED NOT DETECTED Final    Comment: (NOTE) The Coronavirus on the Respiratory Panel, DOES NOT test for the novel  Coronavirus (2019 nCoV)    Coronavirus HKU1 NOT DETECTED NOT DETECTED Final   Coronavirus NL63 NOT DETECTED NOT DETECTED Final   Coronavirus OC43 NOT DETECTED NOT DETECTED Final   Metapneumovirus NOT DETECTED NOT DETECTED Final   Rhinovirus / Enterovirus NOT  DETECTED NOT DETECTED Final   Influenza A NOT DETECTED NOT DETECTED Final   Influenza B NOT DETECTED NOT DETECTED Final   Parainfluenza Virus 1 NOT DETECTED NOT DETECTED Final   Parainfluenza Virus 2 NOT DETECTED NOT DETECTED Final   Parainfluenza Virus 3 NOT DETECTED NOT DETECTED Final   Parainfluenza Virus 4 NOT DETECTED NOT DETECTED Final   Respiratory Syncytial Virus NOT DETECTED NOT DETECTED Final   Bordetella pertussis NOT DETECTED NOT DETECTED Final   Bordetella Parapertussis NOT DETECTED NOT DETECTED Final   Chlamydophila pneumoniae NOT DETECTED NOT DETECTED Final   Mycoplasma pneumoniae NOT DETECTED NOT DETECTED Final    Comment: Performed at [facility], [place]. [address] [place], [place] [address]     Radiology Studies: EEG adult  Result Date: 16-May-2022 Derek Jack, MD     May 16, 2022  9:04 AM Routine EEG Report Brailon Don is a 76 y.o. male with a history of altered mental status and myoclonus who is undergoing an EEG to evaluate for seizures. Report: This EEG was acquired with electrodes placed according to the International 10-20 electrode system (including Fp1, Fp2, F3, F4, C3, C4, P3, P4, O1, O2, T3, T4, T5, T6, A1, A2, Fz, Cz, Pz). The following electrodes were missing or displaced: none. The occipital dominant rhythm was 8.5 Hz with intermittent diffuse slowing. This activity is reactive to stimulation. Drowsiness was manifested by background fragmentation; deeper stages of sleep were identified by K complexes and sleep spindles. There was no focal slowing. There were no interictal epileptiform discharges. There were no electrographic seizures identified. Photic stimulation and hyperventilation were not performed. Impression and clinical correlation: This EEG was obtained while awake and asleep and is  abnormal due to intermittent mild diffuse slowing indicative of global cerebral dysfunction. Epileptiform abnormalities were not seen during this recording. Su Monks,  MD [facility] [phone] If 7pm- 7am, please page neurology on call as listed in [facility].   MR BRAIN WO CONTRAST  Result Date: 04/14/2022 CLINICAL DATA:  Myoclonus, stroke suspected EXAM: MRI HEAD WITHOUT CONTRAST TECHNIQUE: Multiplanar, multiecho pulse sequences of the brain and surrounding structures were obtained without intravenous contrast. COMPARISON:  05/04/2019 MRI head, correlation is also made with 04/13/2022 CT head FINDINGS: Brain: No restricted diffusion to suggest acute or subacute infarct. No acute hemorrhage, mass, mass effect, or midline shift. No hydrocephalus or extra-axial collection. Ventricular size is commensurate with degree of cerebral and cerebellar atrophy. Redemonstrated medial occipital encephalomalacia. Scattered T2 hyperintense signal in the periventricular white matter, likely the sequela of chronic small vessel ischemic disease. The hippocampi are symmetric in size and signal. No heterotopia or evidence of cortical dysgenesis. Vascular: Normal arterial flow voids. Skull and upper cervical spine: Normal marrow signal. Sinuses/Orbits: Mucous retention cysts in the right greater than left maxillary sinus and left anterior ethmoid air cells. Status post bilateral lens replacements. Other: Fluid in the left mastoid air cells. IMPRESSION: No acute intracranial process. No evidence of acute or subacute infarct. Electronically Signed   By: Merilyn Baba M.D.   On: 04/14/2022 22:00    Scheduled Meds:  amLODipine  10 mg Oral Daily   aspirin EC  81 mg Oral Daily   atorvastatin  40 mg Oral Daily   calcium acetate  1,334 mg Oral TID WC   Chlorhexidine Gluconate Cloth  6 each Topical Q0600   cholecalciferol  5,000 Units Oral Daily   clopidogrel  75 mg Oral Daily   donepezil  10 mg Oral Daily   insulin aspart  0-5 Units Subcutaneous QHS   insulin aspart  0-9 Units Subcutaneous TID WC   insulin aspart  2 Units Subcutaneous TID WC   insulin glargine-yfgn  10 Units  Subcutaneous QHS   isosorbide-hydrALAZINE  1 tablet Oral TID   losartan  100 mg Oral QPM   metoprolol succinate  100 mg Oral Daily   multivitamin  1 tablet Oral Daily   pantoprazole  40 mg Oral Daily   sevelamer carbonate  2.4 g Oral Q breakfast   Continuous Infusions:  anticoagulant sodium citrate     levETIRAcetam 500 mg (04/16/22 0949)   And   levETIRAcetam 250 mg (04/14/22 2026)     LOS: 6 days   Shelly Coss, MD [facility] P11/23/2023, 2:22 PM

## 2022-04-16 NOTE — Progress Notes (Signed)
[facility]  ROUNDING NOTE   Subjective:   Patient is a 76 year old man of Asian/Indian origin with a past medical history of ESRD, on hemodialysis on Tuesday Thursday Saturday schedule, diabetes mellitus type 2, CVA, hypothyroidism who came to the ER with chief complaint of shortness of breath. He has been admitted for Shortness of breath [R06.02] Hypoxia [R09.02] HCAP (healthcare-associated pneumonia) [J18.9] Hypoxic respiratory failure ([place]) [J96.91]  Patient is known to our practice from previous admissions and receives outpatient dialysis treatments at [facility] on a TTS schedule, supervised by [facility] nephrologist.   Patient was seen today on second floor Patient's son was present in the room. Patient had no new physical complaints. Patient's son's major concern was if he would be discharged home today.      Objective:  Vital signs in last 24 hours:  Temp:  [97.3 F (36.3 C)-98.5 F (36.9 C)] 98 F (36.7 C) (11/23 0323) Pulse Rate:  [59-69] 60 (11/23 0323) Resp:  [12-27] 18 (11/23 0323) BP: (147-193)/(44-111) 167/52 (11/23 0323) SpO2:  [99 %-100 %] 100 % (11/23 0323) Weight:  [61.6 kg-63.3 kg] 62.1 kg (11/23 0455)  Weight change: -1.1 kg Filed Weights   04/15/22 0900 04/15/22 1224 04/16/22 0455  Weight: 63.3 kg 61.6 kg 62.1 kg    Intake/Output: I/O last 3 completed shifts: In: -  Out: 2100 [Urine:1100; Other:1000]   Intake/Output this shift:  No intake/output data recorded.  Physical Exam: General: NAD  Head: Normocephalic, atraumatic. Moist oral mucosal membranes  Eyes: Anicteric  Lungs:  Clear bilateral, normal effort, room air  Heart: Regular rate and rhythm  Abdomen:  Soft, non-tender, nondistended  Extremities: No peripheral edema.  Neurologic: Awake, alert, conversant  Skin: No lesions  Access: Right chest PermCath    Basic Metabolic Panel: Recent Labs  Lab 04/13/22 1033 04/13/22 1818 04/14/22 0526 04/14/22 1755  04/15/22 0438  NA 137 136 137 136 134*  K 5.6* 5.6* 6.2* 4.2 4.1  CL 104 102 105 97* 98  CO2 '27 23 24 31 28  '$ GLUCOSE 120* 113* 91 130* 103*  BUN 40* 44* 47* 18 23  CREATININE 8.68* 9.27* 9.86* 4.78* 5.66*  CALCIUM 9.2 9.1 9.4 9.1 9.3  MG 2.1  --   --   --   --   PHOS  --   --  2.6  --   --     Liver Function Tests: Recent Labs  Lab 04/11/22 0608 04/13/22 2139 04/14/22 0526  AST 16 14*  --   ALT 10 8  --   ALKPHOS 110 103  --   BILITOT 1.2 0.8  --   PROT 6.0* 5.8*  --   ALBUMIN 2.6* 2.4* 2.5*   No results for input(s): "LIPASE", "AMYLASE" in the last 168 hours. Recent Labs  Lab 04/13/22 2139  AMMONIA 29    CBC: Recent Labs  Lab 04/10/22 1525 04/10/22 1747 04/11/22 0608 04/12/22 0232 04/13/22 0540 04/14/22 0526 04/15/22 0438  WBC 17.3*   < > 10.1 7.3 9.6 6.9 6.5  NEUTROABS 15.6*  --   --   --   --  4.7  --   HGB 10.1*   < > 9.1* 9.4* 8.5* 9.0* 8.6*  HCT 33.3*   < > 29.5* 30.4* 28.3* 29.8* 27.8*  MCV 85.2   < > 84.8 85.2 85.5 85.4 84.5  PLT 184   < > 157 170 193 223 253   < > = values in this interval not displayed.  Cardiac Enzymes: No results for input(s): "CKTOTAL", "CKMB", "CKMBINDEX", "TROPONINI" in the last 168 hours.  BNP: Invalid input(s): "POCBNP"  CBG: Recent Labs  Lab 04/14/22 2109 04/15/22 0829 04/15/22 1243 04/15/22 1701 04/15/22 2130  GLUCAP 138* 103* 103* 154* 2*    Microbiology: Results for orders placed or performed during the hospital encounter of 04/10/22  Resp Panel by RT-PCR (Flu A&B, Covid) Anterior Nasal Swab     Status: None   Collection Time: 04/10/22  3:25 PM   Specimen: Anterior Nasal Swab  Result Value Ref Range Status   SARS Coronavirus 2 by RT PCR NEGATIVE NEGATIVE Final    Comment: (NOTE) SARS-CoV-2 target nucleic acids are NOT DETECTED.  The SARS-CoV-2 RNA is generally detectable in upper respiratory specimens during the acute phase of infection. The lowest concentration of SARS-CoV-2 viral copies this  assay can detect is 138 copies/mL. A negative result does not preclude SARS-Cov-2 infection and should not be used as the sole basis for treatment or other patient management decisions. A negative result may occur with  improper specimen collection/handling, submission of specimen other than nasopharyngeal swab, presence of viral mutation(s) within the areas targeted by this assay, and inadequate number of viral copies(<138 copies/mL). A negative result must be combined with clinical observations, patient history, and epidemiological information. The expected result is Negative.  Fact Sheet for Patients:  EntrepreneurPulse.com.au  Fact Sheet for Healthcare Providers:  IncredibleEmployment.be  This test is no t yet approved or cleared by the [place] FDA and  has been authorized for detection and/or diagnosis of SARS-CoV-2 by FDA under an Emergency Use Authorization (EUA). This EUA will remain  in effect (meaning this test can be used) for the duration of the COVID-19 declaration under Section 564(b)(1) of the Act, 21 U.S.C.section 360bbb-3(b)(1), unless the authorization is terminated  or revoked sooner.       Influenza A by PCR NEGATIVE NEGATIVE Final   Influenza B by PCR NEGATIVE NEGATIVE Final    Comment: (NOTE) The Xpert Xpress SARS-CoV-2/FLU/RSV plus assay is intended as an aid in the diagnosis of influenza from Nasopharyngeal swab specimens and should not be used as a sole basis for treatment. Nasal washings and aspirates are unacceptable for Xpert Xpress SARS-CoV-2/FLU/RSV testing.  Fact Sheet for Patients: EntrepreneurPulse.com.au  Fact Sheet for Healthcare Providers: IncredibleEmployment.be  This test is not yet approved or cleared by the [place] FDA and has been authorized for detection and/or diagnosis of SARS-CoV-2 by FDA under an Emergency Use Authorization (EUA). This EUA will  remain in effect (meaning this test can be used) for the duration of the COVID-19 declaration under Section 564(b)(1) of the Act, 21 U.S.C. section 360bbb-3(b)(1), unless the authorization is terminated or revoked.  Performed at [facility], [address] [place], [place] [address]   Blood culture (routine x 2)     Status: None   Collection Time: 04/10/22  4:37 PM   Specimen: BLOOD  Result Value Ref Range Status   Specimen Description BLOOD BLOOD LEFT ARM  Final   Special Requests   Final    BOTTLES DRAWN AEROBIC AND ANAEROBIC Blood Culture adequate volume   Culture   Final    NO GROWTH 5 DAYS Performed at [facility], [address], [place], [place] [address]    Report Status 04/15/2022 FINAL  Final  Blood culture (routine x 2)     Status: None   Collection Time: 04/10/22  4:37 PM   Specimen: BLOOD  Result  Value Ref Range Status   Specimen Description BLOOD BLOOD LEFT ARM  Final   Special Requests   Final    BOTTLES DRAWN AEROBIC AND ANAEROBIC Blood Culture adequate volume   Culture   Final    NO GROWTH 5 DAYS Performed at [facility], [address], [place], [place] [address]    Report Status 04/15/2022 FINAL  Final  MRSA Next Gen by PCR, Nasal     Status: None   Collection Time: 04/10/22  6:53 PM   Specimen: Nasal Mucosa; Nasal Swab  Result Value Ref Range Status   MRSA by PCR Next Gen NOT DETECTED NOT DETECTED Final    Comment: (NOTE) The GeneXpert MRSA Assay (FDA approved for NASAL specimens only), is one component of a comprehensive MRSA colonization surveillance program. It is not intended to diagnose MRSA infection nor to guide or monitor treatment for MRSA infections. Test performance is not FDA approved in patients Joseph than 76 years old. Performed at [facility], [place], [place] [address]   C Difficile Quick Screen w PCR reflex     Status: None   Collection Time: 04/12/22 10:00 AM   Specimen:  STOOL  Result Value Ref Range Status   C Diff antigen NEGATIVE NEGATIVE Final   C Diff toxin NEGATIVE NEGATIVE Final   C Diff interpretation No C. difficile detected.  Final    Comment: Performed at [facility], [address] [place], [place] [address]  Respiratory (~20 pathogens) panel by PCR     Status: None   Collection Time: 04/13/22  5:15 AM   Specimen: Nasopharyngeal Swab; Respiratory  Result Value Ref Range Status   Adenovirus NOT DETECTED NOT DETECTED Final   Coronavirus 229E NOT DETECTED NOT DETECTED Final    Comment: (NOTE) The Coronavirus on the Respiratory Panel, DOES NOT test for the novel  Coronavirus (2019 nCoV)    Coronavirus HKU1 NOT DETECTED NOT DETECTED Final   Coronavirus NL63 NOT DETECTED NOT DETECTED Final   Coronavirus OC43 NOT DETECTED NOT DETECTED Final   Metapneumovirus NOT DETECTED NOT DETECTED Final   Rhinovirus / Enterovirus NOT DETECTED NOT DETECTED Final   Influenza A NOT DETECTED NOT DETECTED Final   Influenza B NOT DETECTED NOT DETECTED Final   Parainfluenza Virus 1 NOT DETECTED NOT DETECTED Final   Parainfluenza Virus 2 NOT DETECTED NOT DETECTED Final   Parainfluenza Virus 3 NOT DETECTED NOT DETECTED Final   Parainfluenza Virus 4 NOT DETECTED NOT DETECTED Final   Respiratory Syncytial Virus NOT DETECTED NOT DETECTED Final   Bordetella pertussis NOT DETECTED NOT DETECTED Final   Bordetella Parapertussis NOT DETECTED NOT DETECTED Final   Chlamydophila pneumoniae NOT DETECTED NOT DETECTED Final   Mycoplasma pneumoniae NOT DETECTED NOT DETECTED Final    Comment: Performed at [facility], [place]. [address] [place], [place] [address]    Coagulation Studies: No results for input(s): "LABPROT", "INR" in the last 72 hours.    Urinalysis: No results for input(s): "COLORURINE", "LABSPEC", "PHURINE", "GLUCOSEU", "HGBUR", "BILIRUBINUR", "KETONESUR", "PROTEINUR", "UROBILINOGEN", "NITRITE", "LEUKOCYTESUR" in the last 72 hours.  Invalid  input(s): "APPERANCEUR"    Imaging: MR BRAIN WO CONTRAST  Result Date: 04/14/2022 CLINICAL DATA:  Myoclonus, stroke suspected EXAM: MRI HEAD WITHOUT CONTRAST TECHNIQUE: Multiplanar, multiecho pulse sequences of the brain and surrounding structures were obtained without intravenous contrast. COMPARISON:  05/04/2019 MRI head, correlation is also made with 04/13/2022 CT head FINDINGS: Brain: No restricted diffusion to suggest acute or  subacute infarct. No acute hemorrhage, mass, mass effect, or midline shift. No hydrocephalus or extra-axial collection. Ventricular size is commensurate with degree of cerebral and cerebellar atrophy. Redemonstrated medial occipital encephalomalacia. Scattered T2 hyperintense signal in the periventricular white matter, likely the sequela of chronic small vessel ischemic disease. The hippocampi are symmetric in size and signal. No heterotopia or evidence of cortical dysgenesis. Vascular: Normal arterial flow voids. Skull and upper cervical spine: Normal marrow signal. Sinuses/Orbits: Mucous retention cysts in the right greater than left maxillary sinus and left anterior ethmoid air cells. Status post bilateral lens replacements. Other: Fluid in the left mastoid air cells. IMPRESSION: No acute intracranial process. No evidence of acute or subacute infarct. Electronically Signed   By: Merilyn Baba M.D.   On: 04/14/2022 22:00     Medications:    anticoagulant sodium citrate     levETIRAcetam Stopped (04/15/22 1109)   And   levETIRAcetam 250 mg (04/14/22 2026)    amLODipine  10 mg Oral Daily   aspirin EC  81 mg Oral Daily   atorvastatin  40 mg Oral Daily   calcium acetate  1,334 mg Oral TID WC   Chlorhexidine Gluconate Cloth  6 each Topical Q0600   cholecalciferol  5,000 Units Oral Daily   clopidogrel  75 mg Oral Daily   donepezil  10 mg Oral Daily   insulin aspart  0-5 Units Subcutaneous QHS   insulin aspart  0-9 Units Subcutaneous TID WC   insulin aspart  2 Units  Subcutaneous TID WC   insulin glargine-yfgn  10 Units Subcutaneous QHS   isosorbide-hydrALAZINE  1 tablet Oral TID   losartan  100 mg Oral QPM   metoprolol succinate  100 mg Oral Daily   multivitamin  1 tablet Oral Daily   pantoprazole  40 mg Oral Daily   sevelamer carbonate  2.4 g Oral Q breakfast   acetaminophen **OR** acetaminophen, alteplase, anticoagulant sodium citrate, heparin, hydrALAZINE, ipratropium-albuterol, lidocaine (PF), lidocaine-prilocaine, LORazepam, ondansetron **OR** ondansetron (ZOFRAN) IV, oxyCODONE, pentafluoroprop-tetrafluoroeth . Chest x-ray done on March 09, 2022 IMPRESSION: 1. Cardiomegaly with vascular congestion and pulmonary edema 2. Worsening airspace disease at the right greater than left lung base, atelectasis versus pneumonia       Assessment/ Plan:  [name] is a 76 y.o.  male with a past medical history of hypertension, end-stage renal disease, on hemodialysis on Monday Wednesday Friday schedule, diabetes mellitus, CVA who was brought to the ER with chief complaint of weakness.  [facility]/TTS/right chest PermCath  End-stage renal disease with hyperkalemia on hemodialysis.         Patient had significant hyperkalemia yesterday but this has been corrected.  Potassium down to 4.1.  Patient was last dialyzed on Wednesday no acute need for renal replacement therapy today.       Will plan for treatment tomorrow or Saturday depending on patient's clinical situation.  We will monitor closely.     2. Anemia of chronic kidney disease  Lab Results  Component Value Date   HGB 8.6 (L) 04/15/2022    Patient receives Mircera as an outpatient.  Hemoglobin currently 8.6.  Monitor hemoglobin.  3. Secondary Hyperparathyroidism: with outpatient labs: PTH 422, phosphorus 6.4, calcium 9.1 on 12/29/2021.   Lab Results  Component Value Date   CALCIUM 9.3 04/15/2022   CAION 0.83 (LL) 12/10/2020   PHOS 2.6 04/14/2022    Continue calcium acetate 2  tablets p.o. 3 times daily with meals.  4.  Hypertension with  chronic kidney disease.  Home regimen includes hydralazine losartan, and metoprolol.   Maintain the patient on amlodipine, losartan, metoprolol.  Ultrafiltration to assist with blood pressure control.  5. Diabetes mellitus type II with chronic kidney disease insulin dependent. Home regimen includes NovoLog and Levemir. Most recent hemoglobin A1c is 8.1 on 01/18/2022.  Currently prescribed Januvia outpatient.  Currently held.  Sliding scale insuline ordered by primary team.     LOS: 6 Joseph Mcpherson s Joseph Mcpherson 11/23/20237:43 AM

## 2022-04-18 DIAGNOSIS — Z992 Dependence on renal dialysis: Secondary | ICD-10-CM | POA: Diagnosis not present

## 2022-04-18 DIAGNOSIS — N186 End stage renal disease: Secondary | ICD-10-CM | POA: Diagnosis not present

## 2022-04-18 DIAGNOSIS — N2581 Secondary hyperparathyroidism of renal origin: Secondary | ICD-10-CM | POA: Diagnosis not present

## 2022-04-20 DIAGNOSIS — I214 Non-ST elevation (NSTEMI) myocardial infarction: Secondary | ICD-10-CM | POA: Diagnosis not present

## 2022-04-20 DIAGNOSIS — I5020 Unspecified systolic (congestive) heart failure: Secondary | ICD-10-CM | POA: Diagnosis not present

## 2022-04-20 DIAGNOSIS — I10 Essential (primary) hypertension: Secondary | ICD-10-CM | POA: Diagnosis not present

## 2022-04-20 DIAGNOSIS — N186 End stage renal disease: Secondary | ICD-10-CM | POA: Diagnosis not present

## 2022-04-20 DIAGNOSIS — I639 Cerebral infarction, unspecified: Secondary | ICD-10-CM | POA: Diagnosis not present

## 2022-04-20 DIAGNOSIS — E0849 Diabetes mellitus due to underlying condition with other diabetic neurological complication: Secondary | ICD-10-CM | POA: Diagnosis not present

## 2022-04-20 DIAGNOSIS — I6381 Other cerebral infarction due to occlusion or stenosis of small artery: Secondary | ICD-10-CM | POA: Diagnosis not present

## 2022-04-20 DIAGNOSIS — E119 Type 2 diabetes mellitus without complications: Secondary | ICD-10-CM | POA: Diagnosis not present

## 2022-04-20 DIAGNOSIS — E782 Mixed hyperlipidemia: Secondary | ICD-10-CM | POA: Diagnosis not present

## 2022-04-20 LAB — HEPATITIS B SURFACE ANTIBODY, QUANTITATIVE

## 2022-04-21 DIAGNOSIS — N186 End stage renal disease: Secondary | ICD-10-CM | POA: Diagnosis not present

## 2022-04-21 DIAGNOSIS — N2581 Secondary hyperparathyroidism of renal origin: Secondary | ICD-10-CM | POA: Diagnosis not present

## 2022-04-21 DIAGNOSIS — Z992 Dependence on renal dialysis: Secondary | ICD-10-CM | POA: Diagnosis not present

## 2022-04-22 ENCOUNTER — Encounter
Admission: RE | Disposition: A | Discharge status: Home / Self Care | Payer: Self-pay | Source: Home / Self Care | Attending: Cardiology

## 2022-04-22 ENCOUNTER — Encounter: Payer: Self-pay | Admitting: Cardiology

## 2022-04-22 ENCOUNTER — Other Ambulatory Visit: Payer: Self-pay

## 2022-04-22 ENCOUNTER — Ambulatory Visit
Admission: RE | Admit: 2022-04-22 | Discharge: 2022-04-22 | Disposition: A | Discharge status: Home / Self Care | Payer: Medicare HMO | Attending: Cardiology | Admitting: Cardiology

## 2022-04-22 DIAGNOSIS — I340 Nonrheumatic mitral (valve) insufficiency: Secondary | ICD-10-CM | POA: Insufficient documentation

## 2022-04-22 DIAGNOSIS — I214 Non-ST elevation (NSTEMI) myocardial infarction: Secondary | ICD-10-CM | POA: Diagnosis not present

## 2022-04-22 DIAGNOSIS — Z01818 Encounter for other preprocedural examination: Secondary | ICD-10-CM

## 2022-04-22 DIAGNOSIS — I2510 Atherosclerotic heart disease of native coronary artery without angina pectoris: Secondary | ICD-10-CM | POA: Insufficient documentation

## 2022-04-22 DIAGNOSIS — R0602 Shortness of breath: Secondary | ICD-10-CM | POA: Insufficient documentation

## 2022-04-22 DIAGNOSIS — I25119 Atherosclerotic heart disease of native coronary artery with unspecified angina pectoris: Secondary | ICD-10-CM | POA: Diagnosis not present

## 2022-04-22 HISTORY — PX: LEFT HEART CATH AND CORONARY ANGIOGRAPHY: CATH118249

## 2022-04-22 LAB — GLUCOSE, CAPILLARY
Glucose-Capillary: 73 mg/dL (ref 70–99)
Glucose-Capillary: 68 mg/dL — ABNORMAL LOW (ref 70–99)
Glucose-Capillary: 117 mg/dL — ABNORMAL HIGH (ref 70–99)

## 2022-04-22 SURGERY — LEFT HEART CATH AND CORONARY ANGIOGRAPHY
Anesthesia: Moderate Sedation

## 2022-04-22 MED ORDER — HEPARIN (PORCINE) IN NACL 1000-0.9 UT/500ML-% IV SOLN
INTRAVENOUS | Status: AC
  Filled 2022-04-22: qty 1000

## 2022-04-22 MED ORDER — HEPARIN (PORCINE) IN NACL 1000-0.9 UT/500ML-% IV SOLN
INTRAVENOUS | Status: DC | PRN
  Administered 2022-04-22 (×2): 500 mL

## 2022-04-22 MED ORDER — SODIUM CHLORIDE 0.9 % IV SOLN: 250.0000 mL | INTRAVENOUS | Status: DC | PRN

## 2022-04-22 MED ORDER — LABETALOL HCL 5 MG/ML IV SOLN: 10.0000 mg | INTRAVENOUS | Status: DC | PRN

## 2022-04-22 MED ORDER — SODIUM CHLORIDE 0.9% FLUSH: 3.0000 mL | INTRAVENOUS | Status: DC | PRN

## 2022-04-22 MED ORDER — SODIUM CHLORIDE 0.9 % WEIGHT BASED INFUSION: 1.0000 mL/kg/h | INTRAVENOUS | Status: DC

## 2022-04-22 MED ORDER — SODIUM CHLORIDE 0.9% FLUSH: 3.0000 mL | Freq: Two times a day (BID) | INTRAVENOUS | Status: DC

## 2022-04-22 MED ORDER — SODIUM CHLORIDE 0.9 % WEIGHT BASED INFUSION: 3.0000 mL/kg/h | INTRAVENOUS | Status: DC

## 2022-04-22 MED ORDER — IOHEXOL 300 MG/ML  SOLN
INTRAMUSCULAR | Status: DC | PRN
  Administered 2022-04-22: 80 mL

## 2022-04-22 MED ORDER — ACETAMINOPHEN 325 MG PO TABS: 650.0000 mg | ORAL_TABLET | ORAL | Status: DC | PRN

## 2022-04-22 MED ORDER — ASPIRIN 81 MG PO CHEW: 81.0000 mg | CHEWABLE_TABLET | ORAL | Status: DC

## 2022-04-22 MED ORDER — MIDAZOLAM HCL 2 MG/2ML IJ SOLN
INTRAMUSCULAR | Status: AC
  Filled 2022-04-22: qty 2

## 2022-04-22 MED ORDER — HYDRALAZINE HCL 20 MG/ML IJ SOLN: 10.0000 mg | INTRAMUSCULAR | Status: DC | PRN

## 2022-04-22 MED ORDER — FENTANYL CITRATE (PF) 100 MCG/2ML IJ SOLN
INTRAMUSCULAR | Status: AC
  Filled 2022-04-22: qty 2

## 2022-04-22 MED ORDER — SODIUM CHLORIDE 0.9 % IV SOLN
INTRAVENOUS | Status: DC | PRN
  Administered 2022-04-22: 10 mL/h via INTRAVENOUS

## 2022-04-22 MED ORDER — LIDOCAINE HCL (PF) 1 % IJ SOLN
INTRAMUSCULAR | Status: DC | PRN
  Administered 2022-04-22: 10 mL

## 2022-04-22 MED ORDER — ONDANSETRON HCL 4 MG/2ML IJ SOLN: 4.0000 mg | Freq: Four times a day (QID) | INTRAMUSCULAR | Status: DC | PRN

## 2022-04-22 SURGICAL SUPPLY — 11 items
CATH INFINITI 5FR MULTPACK ANG (CATHETERS) IMPLANT
DEVICE CLOSURE MYNXGRIP 5F (Vascular Products) IMPLANT
DRAPE BRACHIAL (DRAPES) IMPLANT
NDL PERC 18GX7CM (NEEDLE) IMPLANT
NEEDLE PERC 18GX7CM (NEEDLE) ×1 IMPLANT
PACK CARDIAC CATH (CUSTOM PROCEDURE TRAY) ×1 IMPLANT
PROTECTION STATION PRESSURIZED (MISCELLANEOUS) ×1
SET ATX SIMPLICITY (MISCELLANEOUS) IMPLANT
SHEATH AVANTI 5FR X 11CM (SHEATH) IMPLANT
STATION PROTECTION PRESSURIZED (MISCELLANEOUS) IMPLANT
WIRE GUIDERIGHT .035X150 (WIRE) IMPLANT

## 2022-04-22 NOTE — Progress Notes (Signed)
Dr. Saralyn Pilar in at bedside speaking with pt. And his wife re: cath results. BG: 68: pt. Asymptomatic. Given applejuice and peanut butter crackers now.

## 2022-04-23 ENCOUNTER — Telehealth: Payer: Self-pay

## 2022-04-23 ENCOUNTER — Encounter: Payer: Self-pay | Admitting: Cardiology

## 2022-04-23 DIAGNOSIS — N186 End stage renal disease: Secondary | ICD-10-CM | POA: Diagnosis not present

## 2022-04-23 DIAGNOSIS — N2581 Secondary hyperparathyroidism of renal origin: Secondary | ICD-10-CM | POA: Diagnosis not present

## 2022-04-23 DIAGNOSIS — E1122 Type 2 diabetes mellitus with diabetic chronic kidney disease: Secondary | ICD-10-CM | POA: Diagnosis not present

## 2022-04-23 DIAGNOSIS — Z992 Dependence on renal dialysis: Secondary | ICD-10-CM | POA: Diagnosis not present

## 2022-04-24 ENCOUNTER — Other Ambulatory Visit: Payer: Self-pay | Admitting: Neurology

## 2022-04-24 DIAGNOSIS — F02A0 Dementia in other diseases classified elsewhere, mild, without behavioral disturbance, psychotic disturbance, mood disturbance, and anxiety: Secondary | ICD-10-CM | POA: Diagnosis not present

## 2022-04-24 DIAGNOSIS — I639 Cerebral infarction, unspecified: Secondary | ICD-10-CM | POA: Diagnosis not present

## 2022-04-24 DIAGNOSIS — R0602 Shortness of breath: Secondary | ICD-10-CM | POA: Diagnosis not present

## 2022-04-24 DIAGNOSIS — N186 End stage renal disease: Secondary | ICD-10-CM | POA: Diagnosis not present

## 2022-04-24 DIAGNOSIS — D631 Anemia in chronic kidney disease: Secondary | ICD-10-CM | POA: Diagnosis not present

## 2022-04-24 DIAGNOSIS — I5020 Unspecified systolic (congestive) heart failure: Secondary | ICD-10-CM | POA: Diagnosis not present

## 2022-04-24 DIAGNOSIS — E782 Mixed hyperlipidemia: Secondary | ICD-10-CM | POA: Diagnosis not present

## 2022-04-24 DIAGNOSIS — I10 Essential (primary) hypertension: Secondary | ICD-10-CM | POA: Diagnosis not present

## 2022-04-24 DIAGNOSIS — I214 Non-ST elevation (NSTEMI) myocardial infarction: Secondary | ICD-10-CM | POA: Diagnosis not present

## 2022-04-24 DIAGNOSIS — Z794 Long term (current) use of insulin: Secondary | ICD-10-CM | POA: Diagnosis not present

## 2022-04-24 DIAGNOSIS — E1122 Type 2 diabetes mellitus with diabetic chronic kidney disease: Secondary | ICD-10-CM | POA: Diagnosis not present

## 2022-04-24 DIAGNOSIS — G253 Myoclonus: Secondary | ICD-10-CM

## 2022-04-24 DIAGNOSIS — I6381 Other cerebral infarction due to occlusion or stenosis of small artery: Secondary | ICD-10-CM | POA: Diagnosis not present

## 2022-04-24 DIAGNOSIS — E119 Type 2 diabetes mellitus without complications: Secondary | ICD-10-CM | POA: Diagnosis not present

## 2022-04-24 DIAGNOSIS — I132 Hypertensive heart and chronic kidney disease with heart failure and with stage 5 chronic kidney disease, or end stage renal disease: Secondary | ICD-10-CM | POA: Diagnosis not present

## 2022-04-24 DIAGNOSIS — G300 Alzheimer's disease with early onset: Secondary | ICD-10-CM | POA: Diagnosis not present

## 2022-04-24 NOTE — Progress Notes (Signed)
Ambulatory referral to neurology placed

## 2022-04-25 DIAGNOSIS — Z992 Dependence on renal dialysis: Secondary | ICD-10-CM | POA: Diagnosis not present

## 2022-04-25 DIAGNOSIS — N2581 Secondary hyperparathyroidism of renal origin: Secondary | ICD-10-CM | POA: Diagnosis not present

## 2022-04-25 DIAGNOSIS — N186 End stage renal disease: Secondary | ICD-10-CM | POA: Diagnosis not present

## 2022-04-26 ENCOUNTER — Other Ambulatory Visit: Payer: Self-pay

## 2022-04-26 ENCOUNTER — Emergency Department: Payer: Medicare HMO

## 2022-04-26 ENCOUNTER — Emergency Department
Admission: EM | Admit: 2022-04-26 | Discharge: 2022-04-26 | Disposition: A | Discharge status: Home / Self Care | Payer: Medicare HMO | Attending: Emergency Medicine | Admitting: Emergency Medicine

## 2022-04-26 ENCOUNTER — Encounter: Payer: Self-pay | Admitting: Emergency Medicine

## 2022-04-26 DIAGNOSIS — R001 Bradycardia, unspecified: Secondary | ICD-10-CM | POA: Diagnosis not present

## 2022-04-26 DIAGNOSIS — I2510 Atherosclerotic heart disease of native coronary artery without angina pectoris: Secondary | ICD-10-CM | POA: Insufficient documentation

## 2022-04-26 DIAGNOSIS — R0602 Shortness of breath: Secondary | ICD-10-CM

## 2022-04-26 DIAGNOSIS — I959 Hypotension, unspecified: Secondary | ICD-10-CM | POA: Diagnosis not present

## 2022-04-26 DIAGNOSIS — I5020 Unspecified systolic (congestive) heart failure: Secondary | ICD-10-CM | POA: Insufficient documentation

## 2022-04-26 DIAGNOSIS — N186 End stage renal disease: Secondary | ICD-10-CM | POA: Insufficient documentation

## 2022-04-26 DIAGNOSIS — J811 Chronic pulmonary edema: Secondary | ICD-10-CM | POA: Diagnosis not present

## 2022-04-26 DIAGNOSIS — Z992 Dependence on renal dialysis: Secondary | ICD-10-CM | POA: Insufficient documentation

## 2022-04-26 DIAGNOSIS — R0689 Other abnormalities of breathing: Secondary | ICD-10-CM | POA: Diagnosis not present

## 2022-04-26 DIAGNOSIS — Z8673 Personal history of transient ischemic attack (TIA), and cerebral infarction without residual deficits: Secondary | ICD-10-CM | POA: Insufficient documentation

## 2022-04-26 DIAGNOSIS — J90 Pleural effusion, not elsewhere classified: Secondary | ICD-10-CM | POA: Diagnosis not present

## 2022-04-26 DIAGNOSIS — R0902 Hypoxemia: Secondary | ICD-10-CM | POA: Diagnosis not present

## 2022-04-26 DIAGNOSIS — I10 Essential (primary) hypertension: Secondary | ICD-10-CM | POA: Diagnosis not present

## 2022-04-26 DIAGNOSIS — R079 Chest pain, unspecified: Secondary | ICD-10-CM | POA: Diagnosis not present

## 2022-04-26 LAB — BASIC METABOLIC PANEL
Anion gap: 10 (ref 5–15)
BUN: 11 mg/dL (ref 8–23)
CO2: 28 mmol/L (ref 22–32)
Calcium: 8.8 mg/dL — ABNORMAL LOW (ref 8.9–10.3)
Chloride: 99 mmol/L (ref 98–111)
Creatinine, Ser: 5.36 mg/dL — ABNORMAL HIGH (ref 0.61–1.24)
GFR, Estimated: 10 mL/min — ABNORMAL LOW (ref >60)
Glucose, Bld: 207 mg/dL — ABNORMAL HIGH (ref 70–99)
Potassium: 4.5 mmol/L (ref 3.5–5.1)
Sodium: 137 mmol/L (ref 135–145)

## 2022-04-26 LAB — CBC
HCT: 31.2 % — ABNORMAL LOW (ref 39.0–52.0)
Hemoglobin: 9.8 g/dL — ABNORMAL LOW (ref 13.0–17.0)
MCH: 26.9 pg (ref 26.0–34.0)
MCHC: 31.4 g/dL (ref 30.0–36.0)
MCV: 85.7 fL (ref 80.0–100.0)
Platelets: 248 10*3/uL (ref 150–400)
RBC: 3.64 MIL/uL — ABNORMAL LOW (ref 4.22–5.81)
RDW: 16.4 % — ABNORMAL HIGH (ref 11.5–15.5)
WBC: 10 10*3/uL (ref 4.0–10.5)
nRBC: 0 % (ref 0.0–0.2)

## 2022-04-26 LAB — TROPONIN I (HIGH SENSITIVITY)
Troponin I (High Sensitivity): 71 ng/L — ABNORMAL HIGH (ref <18)
Troponin I (High Sensitivity): 80 ng/L — ABNORMAL HIGH (ref <18)

## 2022-04-26 NOTE — ED Provider Triage Note (Signed)
Emergency Medicine Provider Triage Evaluation Note  Joseph Mcpherson , a 76 y.o. male  was evaluated in triage.  Pt complains of shortness of breath on home oxygen prn. Dialysis patient--last treatment was yesterday. Shortness of breath started today. No cough or fever.  Physical Exam  There were no vitals taken for this visit. Gen:   Awake, no distress   Resp:  Normal effort  MSK:   Moves extremities without difficulty  Other:    Medical Decision Making  Medically screening exam initiated at 1:38 PM.  Appropriate orders placed.  Hamza Empson was informed that the remainder of the evaluation will be completed by another provider, this initial triage assessment does not replace that evaluation, and the importance of remaining in the ED until their evaluation is complete.    Victorino Dike, FNP 04/26/22 1344

## 2022-04-26 NOTE — ED Notes (Signed)
Pt able to ambulate with standby assist approx. 29f through the hallway and back to the pt's room. The pt's walking O2 saturation remained above 98% on RA while ambulating, the pt denied and SOB, pain or discomfort while ambulating or afterwards. Mumma, MD., has been made aware. Close monitoring continued.

## 2022-04-26 NOTE — ED Provider Notes (Signed)
[facility] Provider Note    Event Date/Time   First MD Initiated Contact with Patient 04/26/22 1730     (approximate)   History   Shortness of Breath   HPI  Joseph Mcpherson is a 76 y.o. male with past medical history significant for CAD, prior CVA, HFrEF, ESRD on HD TTS, who presents to the emergency department following an episode of shortness of breath.  Shortness of breath episode earlier today.  When EMS arrived patient states that he felt like he could not catch his breath.  Since sitting in the emergency department patient states that he is feeling better.  Denies any chest pain.  Denies any nausea or vomiting.  No cough.  No history of DVT or PE.  Completed his dialysis session yesterday without any issues.  Patient is followed by cardiology.  Had a recent cardiac catheterization and has a follow-up appointment at [facility] for CABG.  [facility] appointment is tomorrow at 330.     Physical Exam   Triage Vital Signs: ED Triage Vitals  Enc Vitals Group     BP 04/26/22 1341 (!) (P) 168/58     Pulse Rate 04/26/22 1341 (P) 74     Resp 04/26/22 1341 (P) 16     Temp 04/26/22 1341 (P) 98.5 F (36.9 C)     Temp Source 04/26/22 1341 (P) Oral     SpO2 04/26/22 1341 (P) 100 %     Weight --      Height 04/26/22 1338 '5\' 4"'$  (1.626 m)     Head Circumference --      Peak Flow --      Pain Score 04/26/22 1338 0     Pain Loc --      Pain Edu? --      Excl. in [place]? --     Most recent vital signs: Vitals:   04/26/22 1836 04/26/22 2017  BP:  (!) 170/69  Pulse: 63 75  Resp:  18  Temp:  98.6 F (37 C)  SpO2: 100% 100%    Physical Exam Constitutional:      Appearance: He is well-developed.  HENT:     Head: Atraumatic.  Eyes:     Conjunctiva/sclera: Conjunctivae normal.  Cardiovascular:     Rate and Rhythm: Regular rhythm.  Pulmonary:     Effort: No tachypnea or respiratory distress.     Breath sounds: No wheezing.  Musculoskeletal:     Cervical back: Normal  range of motion.     Right lower leg: No edema.     Left lower leg: No edema.  Skin:    General: Skin is warm.  Neurological:     Mental Status: He is alert. Mental status is at baseline.     IMPRESSION / MDM / ASSESSMENT AND PLAN / ED COURSE  I reviewed the triage vital signs and the nursing notes.  Differential diagnosis including ACS, pneumonia, bronchitis, pulmonary edema  On chart review it had a recent NSTEMI followed by Dr. Saralyn Pilar, referral to [facility] for CABG  RADIOLOGY I independently reviewed imaging, my interpretation of imaging: Chest x-ray with small pleural effusions and mild pulmonary edema   Labs (all labs ordered are listed, but only abnormal results are displayed) Labs interpreted as -   Initial troponin is 70 which appears to be is baseline, repeat troponin 80.  Patient without any chest pain. Labs Reviewed  BASIC METABOLIC PANEL - Abnormal; Notable for the following components:  Result Value   Glucose, Bld 207 (*)    Creatinine, Ser 5.36 (*)    Calcium 8.8 (*)    GFR, Estimated 10 (*)    All other components within normal limits  CBC - Abnormal; Notable for the following components:   RBC 3.64 (*)    Hemoglobin 9.8 (*)    HCT 31.2 (*)    RDW 16.4 (*)    All other components within normal limits  TROPONIN I (HIGH SENSITIVITY) - Abnormal; Notable for the following components:   Troponin I (High Sensitivity) 71 (*)    All other components within normal limits  TROPONIN I (HIGH SENSITIVITY) - Abnormal; Notable for the following components:   Troponin I (High Sensitivity) 80 (*)    All other components within normal limits    Patient able to ambulate in the emergency department without any recurrent chest pain.  Denies any episodes of chest pain tonight.  Ambulatory oxygenation 98% with no increased work of breathing or shortness of breath.  Long conversation with patient's wife and his son over the telephone.  Felt that this episode was  significantly different from whenever he had his recent NSTEMI.  Offered to discuss with the cardiologist on-call however ultimately will follow-up tomorrow with [facility] cardiology for CABG consideration.  Will return to the emergency department if he has any recurrent episodes of shortness of breath, chest pain or any change of symptoms.     PROCEDURES:  Critical Care performed: No  Procedures  Patient's presentation is most consistent with acute presentation with potential threat to life or bodily function.   MEDICATIONS ORDERED IN ED: Medications - No data to display  FINAL CLINICAL IMPRESSION(S) / ED DIAGNOSES   Final diagnoses:  Shortness of breath     Rx / DC Orders   ED Discharge Orders     None        Note:  This document was prepared using Dragon voice recognition software and may include unintentional dictation errors.   Nathaniel Man, MD 04/26/22 2123

## 2022-04-26 NOTE — ED Triage Notes (Signed)
Pt via EMS from home. Pt c/o SOB starting today. Pt was 89% on RA, EMS placed pt on 2L [place] and O2 100% on 2L [place]. Pt states he only wears O2 home PRN. Denies chest pain. Pt poor historian, states he does not know if he has COPD or CHF. Pt is a dialysis pt, [facility] and states he did go yesterday. Denies any missed treatment.

## 2022-04-26 NOTE — ED Triage Notes (Signed)
Pt in via EMS from home with c/o SOB. Pt was 89% RA was placed on 2L now 99%. Pt has been seen here for the same several times and also seen at [facility] with no dx. Crackles to lower lobes.

## 2022-04-27 DIAGNOSIS — N186 End stage renal disease: Secondary | ICD-10-CM | POA: Diagnosis not present

## 2022-04-27 DIAGNOSIS — J9811 Atelectasis: Secondary | ICD-10-CM | POA: Diagnosis not present

## 2022-04-27 DIAGNOSIS — D696 Thrombocytopenia, unspecified: Secondary | ICD-10-CM | POA: Diagnosis not present

## 2022-04-27 DIAGNOSIS — J90 Pleural effusion, not elsewhere classified: Secondary | ICD-10-CM | POA: Diagnosis not present

## 2022-04-27 DIAGNOSIS — R569 Unspecified convulsions: Secondary | ICD-10-CM | POA: Diagnosis not present

## 2022-04-27 DIAGNOSIS — G253 Myoclonus: Secondary | ICD-10-CM | POA: Diagnosis not present

## 2022-04-27 DIAGNOSIS — Z951 Presence of aortocoronary bypass graft: Secondary | ICD-10-CM | POA: Diagnosis not present

## 2022-04-27 DIAGNOSIS — R489 Unspecified symbolic dysfunctions: Secondary | ICD-10-CM | POA: Diagnosis not present

## 2022-04-27 DIAGNOSIS — J810 Acute pulmonary edema: Secondary | ICD-10-CM | POA: Diagnosis not present

## 2022-04-27 DIAGNOSIS — Z4682 Encounter for fitting and adjustment of non-vascular catheter: Secondary | ICD-10-CM | POA: Diagnosis not present

## 2022-04-27 DIAGNOSIS — R404 Transient alteration of awareness: Secondary | ICD-10-CM | POA: Diagnosis not present

## 2022-04-27 DIAGNOSIS — N2581 Secondary hyperparathyroidism of renal origin: Secondary | ICD-10-CM | POA: Diagnosis not present

## 2022-04-27 DIAGNOSIS — E1165 Type 2 diabetes mellitus with hyperglycemia: Secondary | ICD-10-CM | POA: Diagnosis not present

## 2022-04-27 DIAGNOSIS — Z794 Long term (current) use of insulin: Secondary | ICD-10-CM | POA: Diagnosis not present

## 2022-04-27 DIAGNOSIS — R0602 Shortness of breath: Secondary | ICD-10-CM | POA: Diagnosis present

## 2022-04-27 DIAGNOSIS — Z01810 Encounter for preprocedural cardiovascular examination: Secondary | ICD-10-CM | POA: Diagnosis not present

## 2022-04-27 DIAGNOSIS — R0689 Other abnormalities of breathing: Secondary | ICD-10-CM | POA: Diagnosis not present

## 2022-04-27 DIAGNOSIS — R0902 Hypoxemia: Secondary | ICD-10-CM | POA: Diagnosis not present

## 2022-04-27 DIAGNOSIS — I25118 Atherosclerotic heart disease of native coronary artery with other forms of angina pectoris: Secondary | ICD-10-CM | POA: Diagnosis not present

## 2022-04-27 DIAGNOSIS — I120 Hypertensive chronic kidney disease with stage 5 chronic kidney disease or end stage renal disease: Secondary | ICD-10-CM | POA: Diagnosis not present

## 2022-04-27 DIAGNOSIS — J811 Chronic pulmonary edema: Secondary | ICD-10-CM | POA: Diagnosis not present

## 2022-04-27 DIAGNOSIS — E43 Unspecified severe protein-calorie malnutrition: Secondary | ICD-10-CM | POA: Diagnosis not present

## 2022-04-27 DIAGNOSIS — J9601 Acute respiratory failure with hypoxia: Secondary | ICD-10-CM | POA: Diagnosis not present

## 2022-04-27 DIAGNOSIS — Z01818 Encounter for other preprocedural examination: Secondary | ICD-10-CM | POA: Diagnosis not present

## 2022-04-27 DIAGNOSIS — F02A0 Dementia in other diseases classified elsewhere, mild, without behavioral disturbance, psychotic disturbance, mood disturbance, and anxiety: Secondary | ICD-10-CM | POA: Diagnosis not present

## 2022-04-27 DIAGNOSIS — E039 Hypothyroidism, unspecified: Secondary | ICD-10-CM | POA: Diagnosis not present

## 2022-04-27 DIAGNOSIS — T50905A Adverse effect of unspecified drugs, medicaments and biological substances, initial encounter: Secondary | ICD-10-CM | POA: Diagnosis not present

## 2022-04-27 DIAGNOSIS — R410 Disorientation, unspecified: Secondary | ICD-10-CM | POA: Diagnosis not present

## 2022-04-27 DIAGNOSIS — G300 Alzheimer's disease with early onset: Secondary | ICD-10-CM | POA: Diagnosis not present

## 2022-04-27 DIAGNOSIS — R918 Other nonspecific abnormal finding of lung field: Secondary | ICD-10-CM | POA: Diagnosis not present

## 2022-04-27 DIAGNOSIS — R059 Cough, unspecified: Secondary | ICD-10-CM | POA: Diagnosis not present

## 2022-04-27 DIAGNOSIS — I5021 Acute systolic (congestive) heart failure: Secondary | ICD-10-CM | POA: Diagnosis not present

## 2022-04-27 DIAGNOSIS — I5022 Chronic systolic (congestive) heart failure: Secondary | ICD-10-CM | POA: Diagnosis not present

## 2022-04-27 DIAGNOSIS — Z79899 Other long term (current) drug therapy: Secondary | ICD-10-CM | POA: Diagnosis not present

## 2022-04-27 DIAGNOSIS — E119 Type 2 diabetes mellitus without complications: Secondary | ICD-10-CM | POA: Diagnosis not present

## 2022-04-27 DIAGNOSIS — Z992 Dependence on renal dialysis: Secondary | ICD-10-CM | POA: Diagnosis not present

## 2022-04-27 DIAGNOSIS — I6621 Occlusion and stenosis of right posterior cerebral artery: Secondary | ICD-10-CM | POA: Diagnosis not present

## 2022-04-27 DIAGNOSIS — D631 Anemia in chronic kidney disease: Secondary | ICD-10-CM | POA: Diagnosis not present

## 2022-04-27 DIAGNOSIS — D62 Acute posthemorrhagic anemia: Secondary | ICD-10-CM | POA: Diagnosis not present

## 2022-04-27 DIAGNOSIS — R1310 Dysphagia, unspecified: Secondary | ICD-10-CM | POA: Diagnosis not present

## 2022-04-27 DIAGNOSIS — I5023 Acute on chronic systolic (congestive) heart failure: Secondary | ICD-10-CM | POA: Diagnosis not present

## 2022-04-27 DIAGNOSIS — I2583 Coronary atherosclerosis due to lipid rich plaque: Secondary | ICD-10-CM | POA: Diagnosis not present

## 2022-04-27 DIAGNOSIS — R29818 Other symptoms and signs involving the nervous system: Secondary | ICD-10-CM | POA: Diagnosis not present

## 2022-04-27 DIAGNOSIS — F0390 Unspecified dementia without behavioral disturbance: Secondary | ICD-10-CM | POA: Diagnosis not present

## 2022-04-27 DIAGNOSIS — I214 Non-ST elevation (NSTEMI) myocardial infarction: Secondary | ICD-10-CM | POA: Diagnosis not present

## 2022-04-27 DIAGNOSIS — Z452 Encounter for adjustment and management of vascular access device: Secondary | ICD-10-CM | POA: Diagnosis not present

## 2022-04-27 DIAGNOSIS — Z8616 Personal history of COVID-19: Secondary | ICD-10-CM | POA: Diagnosis not present

## 2022-04-27 DIAGNOSIS — Z4659 Encounter for fitting and adjustment of other gastrointestinal appliance and device: Secondary | ICD-10-CM | POA: Diagnosis not present

## 2022-04-27 DIAGNOSIS — E1122 Type 2 diabetes mellitus with diabetic chronic kidney disease: Secondary | ICD-10-CM | POA: Diagnosis not present

## 2022-04-27 DIAGNOSIS — R079 Chest pain, unspecified: Secondary | ICD-10-CM | POA: Diagnosis not present

## 2022-04-27 DIAGNOSIS — E785 Hyperlipidemia, unspecified: Secondary | ICD-10-CM | POA: Diagnosis not present

## 2022-04-27 DIAGNOSIS — E1140 Type 2 diabetes mellitus with diabetic neuropathy, unspecified: Secondary | ICD-10-CM | POA: Diagnosis not present

## 2022-04-27 DIAGNOSIS — Z7982 Long term (current) use of aspirin: Secondary | ICD-10-CM | POA: Diagnosis not present

## 2022-04-27 DIAGNOSIS — Z955 Presence of coronary angioplasty implant and graft: Secondary | ICD-10-CM | POA: Diagnosis not present

## 2022-04-27 DIAGNOSIS — I2510 Atherosclerotic heart disease of native coronary artery without angina pectoris: Secondary | ICD-10-CM | POA: Diagnosis not present

## 2022-04-27 DIAGNOSIS — E11649 Type 2 diabetes mellitus with hypoglycemia without coma: Secondary | ICD-10-CM | POA: Diagnosis not present

## 2022-04-27 DIAGNOSIS — Z6823 Body mass index (BMI) 23.0-23.9, adult: Secondary | ICD-10-CM | POA: Diagnosis not present

## 2022-04-27 DIAGNOSIS — I132 Hypertensive heart and chronic kidney disease with heart failure and with stage 5 chronic kidney disease, or end stage renal disease: Secondary | ICD-10-CM | POA: Diagnosis not present

## 2022-04-28 ENCOUNTER — Emergency Department
Admission: EM | Admit: 2022-04-28 | Discharge: 2022-04-28 | Disposition: A | Discharge status: Other Acute Inpatient Hospital | Payer: Medicare HMO | Attending: Emergency Medicine | Admitting: Emergency Medicine

## 2022-04-28 ENCOUNTER — Other Ambulatory Visit: Payer: Self-pay

## 2022-04-28 ENCOUNTER — Emergency Department: Payer: Medicare HMO

## 2022-04-28 ENCOUNTER — Encounter: Payer: Self-pay | Admitting: *Deleted

## 2022-04-28 DIAGNOSIS — G300 Alzheimer's disease with early onset: Secondary | ICD-10-CM | POA: Diagnosis not present

## 2022-04-28 DIAGNOSIS — R0602 Shortness of breath: Secondary | ICD-10-CM | POA: Diagnosis not present

## 2022-04-28 DIAGNOSIS — Z7982 Long term (current) use of aspirin: Secondary | ICD-10-CM | POA: Diagnosis not present

## 2022-04-28 DIAGNOSIS — R079 Chest pain, unspecified: Secondary | ICD-10-CM | POA: Diagnosis not present

## 2022-04-28 DIAGNOSIS — E1122 Type 2 diabetes mellitus with diabetic chronic kidney disease: Secondary | ICD-10-CM | POA: Insufficient documentation

## 2022-04-28 DIAGNOSIS — Z992 Dependence on renal dialysis: Secondary | ICD-10-CM | POA: Insufficient documentation

## 2022-04-28 DIAGNOSIS — F02A0 Dementia in other diseases classified elsewhere, mild, without behavioral disturbance, psychotic disturbance, mood disturbance, and anxiety: Secondary | ICD-10-CM | POA: Diagnosis not present

## 2022-04-28 DIAGNOSIS — I5021 Acute systolic (congestive) heart failure: Secondary | ICD-10-CM | POA: Insufficient documentation

## 2022-04-28 DIAGNOSIS — I25118 Atherosclerotic heart disease of native coronary artery with other forms of angina pectoris: Secondary | ICD-10-CM | POA: Insufficient documentation

## 2022-04-28 DIAGNOSIS — Z8616 Personal history of COVID-19: Secondary | ICD-10-CM | POA: Diagnosis not present

## 2022-04-28 DIAGNOSIS — F0390 Unspecified dementia without behavioral disturbance: Secondary | ICD-10-CM | POA: Insufficient documentation

## 2022-04-28 DIAGNOSIS — J9601 Acute respiratory failure with hypoxia: Secondary | ICD-10-CM | POA: Diagnosis not present

## 2022-04-28 DIAGNOSIS — E1140 Type 2 diabetes mellitus with diabetic neuropathy, unspecified: Secondary | ICD-10-CM | POA: Diagnosis not present

## 2022-04-28 DIAGNOSIS — R0689 Other abnormalities of breathing: Secondary | ICD-10-CM | POA: Diagnosis not present

## 2022-04-28 DIAGNOSIS — I132 Hypertensive heart and chronic kidney disease with heart failure and with stage 5 chronic kidney disease, or end stage renal disease: Secondary | ICD-10-CM | POA: Diagnosis not present

## 2022-04-28 DIAGNOSIS — J810 Acute pulmonary edema: Secondary | ICD-10-CM | POA: Diagnosis not present

## 2022-04-28 DIAGNOSIS — J90 Pleural effusion, not elsewhere classified: Secondary | ICD-10-CM | POA: Diagnosis not present

## 2022-04-28 DIAGNOSIS — E039 Hypothyroidism, unspecified: Secondary | ICD-10-CM | POA: Diagnosis not present

## 2022-04-28 DIAGNOSIS — I214 Non-ST elevation (NSTEMI) myocardial infarction: Secondary | ICD-10-CM | POA: Diagnosis not present

## 2022-04-28 DIAGNOSIS — Z955 Presence of coronary angioplasty implant and graft: Secondary | ICD-10-CM | POA: Insufficient documentation

## 2022-04-28 DIAGNOSIS — I120 Hypertensive chronic kidney disease with stage 5 chronic kidney disease or end stage renal disease: Secondary | ICD-10-CM | POA: Diagnosis not present

## 2022-04-28 DIAGNOSIS — Z794 Long term (current) use of insulin: Secondary | ICD-10-CM | POA: Diagnosis not present

## 2022-04-28 DIAGNOSIS — R0902 Hypoxemia: Secondary | ICD-10-CM | POA: Diagnosis not present

## 2022-04-28 DIAGNOSIS — N2581 Secondary hyperparathyroidism of renal origin: Secondary | ICD-10-CM | POA: Diagnosis not present

## 2022-04-28 DIAGNOSIS — Z79899 Other long term (current) drug therapy: Secondary | ICD-10-CM | POA: Diagnosis not present

## 2022-04-28 DIAGNOSIS — I2510 Atherosclerotic heart disease of native coronary artery without angina pectoris: Secondary | ICD-10-CM | POA: Diagnosis not present

## 2022-04-28 DIAGNOSIS — N186 End stage renal disease: Secondary | ICD-10-CM | POA: Diagnosis not present

## 2022-04-28 DIAGNOSIS — R489 Unspecified symbolic dysfunctions: Secondary | ICD-10-CM | POA: Diagnosis not present

## 2022-04-28 DIAGNOSIS — D631 Anemia in chronic kidney disease: Secondary | ICD-10-CM | POA: Insufficient documentation

## 2022-04-28 DIAGNOSIS — J811 Chronic pulmonary edema: Secondary | ICD-10-CM | POA: Diagnosis not present

## 2022-04-28 LAB — BASIC METABOLIC PANEL
Anion gap: 11 (ref 5–15)
Anion gap: 15 (ref 5–15)
BUN: 24 mg/dL — ABNORMAL HIGH (ref 8–23)
BUN: 25 mg/dL — ABNORMAL HIGH (ref 8–23)
CO2: 26 mmol/L (ref 22–32)
CO2: 25 mmol/L (ref 22–32)
Calcium: 9 mg/dL (ref 8.9–10.3)
Calcium: 9.7 mg/dL (ref 8.9–10.3)
Chloride: 99 mmol/L (ref 98–111)
Chloride: 96 mmol/L — ABNORMAL LOW (ref 98–111)
Creatinine, Ser: 7.99 mg/dL — ABNORMAL HIGH (ref 0.61–1.24)
Creatinine, Ser: 8.27 mg/dL — ABNORMAL HIGH (ref 0.61–1.24)
GFR, Estimated: 6 mL/min — ABNORMAL LOW (ref >60)
GFR, Estimated: 6 mL/min — ABNORMAL LOW (ref >60)
Glucose, Bld: 268 mg/dL — ABNORMAL HIGH (ref 70–99)
Glucose, Bld: 232 mg/dL — ABNORMAL HIGH (ref 70–99)
Potassium: 4.6 mmol/L (ref 3.5–5.1)
Potassium: 4.8 mmol/L (ref 3.5–5.1)
Sodium: 136 mmol/L (ref 135–145)
Sodium: 136 mmol/L (ref 135–145)

## 2022-04-28 LAB — CBC WITH DIFFERENTIAL/PLATELET
Abs Immature Granulocytes: 0.09 10*3/uL — ABNORMAL HIGH (ref 0.00–0.07)
Basophils Absolute: 0.1 10*3/uL (ref 0.0–0.1)
Basophils Relative: 1 %
Eosinophils Absolute: 0.1 10*3/uL (ref 0.0–0.5)
Eosinophils Relative: 1 %
HCT: 33.9 % — ABNORMAL LOW (ref 39.0–52.0)
Hemoglobin: 10.4 g/dL — ABNORMAL LOW (ref 13.0–17.0)
Immature Granulocytes: 1 %
Lymphocytes Relative: 19 %
Lymphs Abs: 3.3 10*3/uL (ref 0.7–4.0)
MCH: 26.2 pg (ref 26.0–34.0)
MCHC: 30.7 g/dL (ref 30.0–36.0)
MCV: 85.4 fL (ref 80.0–100.0)
Monocytes Absolute: 0.9 10*3/uL (ref 0.1–1.0)
Monocytes Relative: 5 %
Neutro Abs: 12.9 10*3/uL — ABNORMAL HIGH (ref 1.7–7.7)
Neutrophils Relative %: 73 %
Platelets: 426 10*3/uL — ABNORMAL HIGH (ref 150–400)
RBC: 3.97 MIL/uL — ABNORMAL LOW (ref 4.22–5.81)
RDW: 16.6 % — ABNORMAL HIGH (ref 11.5–15.5)
WBC: 17.3 10*3/uL — ABNORMAL HIGH (ref 4.0–10.5)
nRBC: 0 % (ref 0.0–0.2)

## 2022-04-28 LAB — TROPONIN I (HIGH SENSITIVITY)
Troponin I (High Sensitivity): 53 ng/L — ABNORMAL HIGH (ref <18)
Troponin I (High Sensitivity): 50 ng/L — ABNORMAL HIGH (ref <18)
Troponin I (High Sensitivity): 54 ng/L — ABNORMAL HIGH (ref <18)
Troponin I (High Sensitivity): 47 ng/L — ABNORMAL HIGH (ref <18)

## 2022-04-28 LAB — CBC
HCT: 28.6 % — ABNORMAL LOW (ref 39.0–52.0)
Hemoglobin: 9 g/dL — ABNORMAL LOW (ref 13.0–17.0)
MCH: 26.9 pg (ref 26.0–34.0)
MCHC: 31.5 g/dL (ref 30.0–36.0)
MCV: 85.4 fL (ref 80.0–100.0)
Platelets: 316 10*3/uL (ref 150–400)
RBC: 3.35 MIL/uL — ABNORMAL LOW (ref 4.22–5.81)
RDW: 16.5 % — ABNORMAL HIGH (ref 11.5–15.5)
WBC: 11.9 10*3/uL — ABNORMAL HIGH (ref 4.0–10.5)
nRBC: 0 % (ref 0.0–0.2)

## 2022-04-28 LAB — HEPATITIS B SURFACE ANTIGEN: Hepatitis B Surface Ag: NONREACTIVE

## 2022-04-28 MED ORDER — HYDRALAZINE HCL 50 MG PO TABS
25.0000 mg | ORAL_TABLET | Freq: Two times a day (BID) | ORAL | Status: DC
  Administered 2022-04-28: 25 mg via ORAL
  Filled 2022-04-28: qty 1

## 2022-04-28 MED ORDER — INSULIN DETEMIR 100 UNIT/ML ~~LOC~~ SOLN
14.0000 [IU] | Freq: Every day | SUBCUTANEOUS | Status: DC
  Filled 2022-04-28: qty 0.14

## 2022-04-28 MED ORDER — LIDOCAINE-PRILOCAINE 2.5-2.5 % EX CREA: 1.0000 | TOPICAL_CREAM | CUTANEOUS | Status: DC | PRN

## 2022-04-28 MED ORDER — METOPROLOL SUCCINATE ER 50 MG PO TB24
100.0000 mg | ORAL_TABLET | Freq: Every day | ORAL | Status: DC
  Administered 2022-04-28: 100 mg via ORAL
  Filled 2022-04-28: qty 2

## 2022-04-28 MED ORDER — CHLORHEXIDINE GLUCONATE CLOTH 2 % EX PADS
6.0000 | MEDICATED_PAD | Freq: Every day | CUTANEOUS | Status: DC
  Filled 2022-04-28: qty 6

## 2022-04-28 MED ORDER — LEVETIRACETAM 500 MG PO TABS
500.0000 mg | ORAL_TABLET | Freq: Every day | ORAL | Status: DC
  Administered 2022-04-28: 500 mg via ORAL
  Filled 2022-04-28: qty 1

## 2022-04-28 MED ORDER — ATORVASTATIN CALCIUM 20 MG PO TABS
40.0000 mg | ORAL_TABLET | Freq: Every day | ORAL | Status: DC
  Administered 2022-04-28: 40 mg via ORAL
  Filled 2022-04-28: qty 2

## 2022-04-28 MED ORDER — RENA-VITE PO TABS
1.0000 | ORAL_TABLET | Freq: Every day | ORAL | Status: DC
  Administered 2022-04-28: 1 via ORAL
  Filled 2022-04-28: qty 1

## 2022-04-28 MED ORDER — HEPARIN SODIUM (PORCINE) 1000 UNIT/ML IJ SOLN
INTRAMUSCULAR | Status: AC
  Filled 2022-04-28: qty 10

## 2022-04-28 MED ORDER — ASPIRIN 81 MG PO TBEC
81.0000 mg | DELAYED_RELEASE_TABLET | Freq: Every day | ORAL | Status: DC
  Administered 2022-04-28: 81 mg via ORAL
  Filled 2022-04-28: qty 1

## 2022-04-28 MED ORDER — IPRATROPIUM-ALBUTEROL 0.5-2.5 (3) MG/3ML IN SOLN
3.0000 mL | Freq: Four times a day (QID) | RESPIRATORY_TRACT | Status: DC | PRN
  Administered 2022-04-28: 3 mL via RESPIRATORY_TRACT
  Filled 2022-04-28: qty 3

## 2022-04-28 MED ORDER — CLOPIDOGREL BISULFATE 75 MG PO TABS
75.0000 mg | ORAL_TABLET | Freq: Every day | ORAL | Status: DC
  Administered 2022-04-28: 75 mg via ORAL
  Filled 2022-04-28: qty 1

## 2022-04-28 MED ORDER — HEPARIN SODIUM (PORCINE) 1000 UNIT/ML DIALYSIS: 1000.0000 [IU] | INTRAMUSCULAR | Status: DC | PRN

## 2022-04-28 MED ORDER — SEVELAMER CARBONATE 2.4 G PO PACK
2.4000 g | PACK | Freq: Every day | ORAL | Status: DC
  Administered 2022-04-28: 2.4 g via ORAL
  Filled 2022-04-28: qty 1

## 2022-04-28 MED ORDER — PANTOPRAZOLE SODIUM 40 MG PO TBEC
40.0000 mg | DELAYED_RELEASE_TABLET | Freq: Every day | ORAL | Status: DC
  Administered 2022-04-28: 40 mg via ORAL
  Filled 2022-04-28: qty 1

## 2022-04-28 MED ORDER — AMLODIPINE BESYLATE 5 MG PO TABS
10.0000 mg | ORAL_TABLET | Freq: Every day | ORAL | Status: DC
  Administered 2022-04-28: 10 mg via ORAL
  Filled 2022-04-28: qty 2

## 2022-04-28 MED ORDER — ALTEPLASE 2 MG IJ SOLR: 2.0000 mg | Freq: Once | INTRAMUSCULAR | Status: DC | PRN

## 2022-04-28 MED ORDER — LINAGLIPTIN 5 MG PO TABS
5.0000 mg | ORAL_TABLET | Freq: Every day | ORAL | Status: DC
  Administered 2022-04-28: 5 mg via ORAL
  Filled 2022-04-28: qty 1

## 2022-04-28 MED ORDER — LEVETIRACETAM 250 MG PO TABS
250.0000 mg | ORAL_TABLET | ORAL | Status: DC
  Administered 2022-04-28: 250 mg via ORAL
  Filled 2022-04-28: qty 1

## 2022-04-28 MED ORDER — DONEPEZIL HCL 5 MG PO TABS
10.0000 mg | ORAL_TABLET | Freq: Every day | ORAL | Status: DC
  Administered 2022-04-28: 10 mg via ORAL
  Filled 2022-04-28: qty 2

## 2022-04-28 MED ORDER — ASPIRIN 81 MG PO CHEW
324.0000 mg | CHEWABLE_TABLET | Freq: Once | ORAL | Status: AC
  Administered 2022-04-28: 324 mg via ORAL
  Filled 2022-04-28: qty 4

## 2022-04-28 MED ORDER — PENTAFLUOROPROP-TETRAFLUOROETH EX AERO: 1.0000 | INHALATION_SPRAY | CUTANEOUS | Status: DC | PRN

## 2022-04-28 MED ORDER — VITAMIN D3 25 MCG (1000 UNIT) PO TABS
5000.0000 [IU] | ORAL_TABLET | Freq: Every day | ORAL | Status: DC
  Administered 2022-04-28: 5000 [IU] via ORAL
  Filled 2022-04-28 (×2): qty 5

## 2022-04-28 MED ORDER — ANTICOAGULANT SODIUM CITRATE 4% (200MG/5ML) IV SOLN: 5.0000 mL | Status: DC | PRN

## 2022-04-28 MED ORDER — LIDOCAINE HCL (PF) 1 % IJ SOLN: 5.0000 mL | INTRAMUSCULAR | Status: DC | PRN

## 2022-04-28 MED ORDER — LOSARTAN POTASSIUM 50 MG PO TABS
100.0000 mg | ORAL_TABLET | Freq: Every evening | ORAL | Status: DC
  Administered 2022-04-28: 100 mg via ORAL
  Filled 2022-04-28: qty 2

## 2022-04-28 MED ORDER — LEVOTHYROXINE SODIUM 137 MCG PO TABS
137.0000 ug | ORAL_TABLET | Freq: Every day | ORAL | Status: DC
  Administered 2022-04-28: 137 ug via ORAL
  Filled 2022-04-28: qty 1

## 2022-04-28 MED ORDER — CALCIUM ACETATE (PHOS BINDER) 667 MG PO CAPS
1334.0000 mg | ORAL_CAPSULE | Freq: Three times a day (TID) | ORAL | Status: DC
  Administered 2022-04-28 (×2): 1334 mg via ORAL
  Filled 2022-04-28 (×2): qty 2

## 2022-04-28 NOTE — ED Notes (Signed)
Patient in resp distres and diaphoretic with audible wheezing and retraction.  Patient is tripoding and MD funke notified.

## 2022-04-28 NOTE — ED Notes (Signed)
Pt has a bed assignment @ [facility] on [address]. AC:Dr.Brittany Zwischenberger Bed 5830 [id number] Call report to [phone]

## 2022-04-28 NOTE — ED Notes (Signed)
Pt room air SpO2 84%. Pt placed on oxygen at 2L [place] and SpO2 increased to 88%. Oxygen adjusted to 4L via [place].

## 2022-04-28 NOTE — ED Notes (Signed)
Removed from Bipap which was requested by Dr Jari Pigg.  Patient seems to be tolerating RA at this time and is in no distress and reports feeling better.

## 2022-04-28 NOTE — ED Provider Notes (Signed)
Procedures  Clinical Course as of 04/28/22 1800  Tue Apr 28, 2022  0502 Repeat troponin is 50.  Will call [facility] transfer center to speak with [facility] cardiology. [JS]  [phone] Spoke with Dr. Margaretmary Lombard from [facility] cardiology who will accept patient in transfer. He does recommend reaching out to CT surgery to see if they will accept the patient on their service as there are currently no cardiology beds available and patient will have to be placed on a wait list. [JS]  [phone] Patient now requiring 4 L nasal cannula oxygen for room air saturations 84% checked 10 minutes ago.  Will obtain repeat chest x-ray.  Have updated patient and spouse regarding speaking with cardiology and acceptance to [facility]. [JS]  [phone] Still awaiting callback from cardiothoracic surgery at [facility].  In the meantime, I have spoken with Dr. Candiss Norse from nephrology who will arrange for hemodialysis this morning. [JS]  1003 Accepted to [place] [facility] surgery by Dr. Antonieta Iba.  No beds available this morning so we will proceed with hemodialysis at our facility this morning.  Patient and spouse updated and agreeable with plan of care. [JS]  1354 O2 Device: Bi-PAP [MF]    Clinical Course User Index [JS] Paulette Blanch, MD [MF] Vanessa [place], MD    ----------------------------------------- 6:00 PM on 04/28/2022 ----------------------------------------- Repeat trop stable / slightly downtrending.  Completed HD with 1200 mL UF. Breathing comfortably off bipap. Will update receiving team at [facility].     Carrie Mew, MD 04/28/22 [phone]

## 2022-04-28 NOTE — ED Triage Notes (Addendum)
First nurse note-To triage via [facility] from home with c/o sob. Pt scheduled for CABG at [facility] next week. Multiple episodes of sob over past week. Was seen here yesterday for same c/o. Pt family member states that was told by providers at [facility] that if pt presents to ED at [facility] again for c/o, that pt needs to be transferred to [facility].

## 2022-04-28 NOTE — Progress Notes (Signed)
[facility]  ROUNDING NOTE   Subjective:   Patient is a 76 year old man of Asian/Indian origin with a past medical history of ESRD, on hemodialysis on Tuesday Thursday Saturday schedule, diabetes mellitus type 2, CVA, hypothyroidism who came to the ER with chief complaint of shortness of breath. He has been admitted for Shortness of Breath-EMS  Patient is known to our practice from previous admissions and receives outpatient dialysis treatments at [facility] on a TTS schedule, supervised by [facility] nephrologist.  Patient has experienced multiple admissions of shortness of breath recently.  Family was instructed if patient presented to [facility] again with shortness of breath, have patient transferred to [facility] system.  Patient is scheduled to have a CABG at [facility] next week.  Denies associating symptoms of fever, cough, chest pain, nausea, vomiting, dizziness.  Labs on ED arrival significant for glucose 268, BUN 24, creatinine 7.99 with GFR 6, troponin 53, elevated WBC 11.9 with hemoglobin 9.0.  Labs elevated today, WBC 17.3.  Chest x-ray shows diffuse interstitial opacities with subtle bibasilar airspace disease.  We have been consulted to provide dialysis during this hospitalization.   Objective:  Vital signs in last 24 hours:  Temp:  [97.8 F (36.6 C)-98.5 F (36.9 C)] 97.8 F (36.6 C) (12/05 1234) Pulse Rate:  [55-95] 59 (12/05 1615) Resp:  [13-30] 21 (12/05 1615) BP: (133-208)/(65-107) 165/80 (12/05 1615) SpO2:  [84 %-100 %] 97 % (12/05 1615) FiO2 (%):  [35 %] 35 % (12/05 1245) Weight:  [65 kg] 65 kg (12/05 0226)  Weight change:  Filed Weights   04/28/22 0226  Weight: 65 kg    Intake/Output: No intake/output data recorded.   Intake/Output this shift:  No intake/output data recorded.  Physical Exam: General: Ill-appearing  Head: Normocephalic, atraumatic. Moist oral mucosal membranes  Eyes: Anicteric  Lungs:  Basilar crackles normal effort, [place]  O2  Heart: Regular rate and rhythm  Abdomen:  Soft, non-tender, nondistended  Extremities: No peripheral edema.  Neurologic: Awake, alert, conversant  Skin: No lesions  Access: Right chest PermCath    Basic Metabolic Panel: Recent Labs  Lab 04/26/22 1350 04/28/22 0230 04/28/22 1234  NA 137 136 136  K 4.5 4.6 4.8  CL 99 99 96*  CO2 '28 26 25  '$ GLUCOSE 207* 268* 232*  BUN 11 24* 25*  CREATININE 5.36* 7.99* 8.27*  CALCIUM 8.8* 9.0 9.7     Liver Function Tests: No results for input(s): "AST", "ALT", "ALKPHOS", "BILITOT", "PROT", "ALBUMIN" in the last 168 hours.  No results for input(s): "LIPASE", "AMYLASE" in the last 168 hours. No results for input(s): "AMMONIA" in the last 168 hours.   CBC: Recent Labs  Lab 04/26/22 1350 04/28/22 0230 04/28/22 1234  WBC 10.0 11.9* 17.3*  NEUTROABS  --   --  12.9*  HGB 9.8* 9.0* 10.4*  HCT 31.2* 28.6* 33.9*  MCV 85.7 85.4 85.4  PLT 248 316 426*     Cardiac Enzymes: No results for input(s): "CKTOTAL", "CKMB", "CKMBINDEX", "TROPONINI" in the last 168 hours.  BNP: Invalid input(s): "POCBNP"  CBG: Recent Labs  Lab 04/22/22 0726 04/22/22 0848 04/22/22 0944  GLUCAP 73 80* 117*     Microbiology: Results for orders placed or performed during the hospital encounter of 04/10/22  Resp Panel by RT-PCR (Flu A&B, Covid) Anterior Nasal Swab     Status: None   Collection Time: 04/10/22  3:25 PM   Specimen: Anterior Nasal Swab  Result Value Ref Range Status   SARS Coronavirus  2 by RT PCR NEGATIVE NEGATIVE Final    Comment: (NOTE) SARS-CoV-2 target nucleic acids are NOT DETECTED.  The SARS-CoV-2 RNA is generally detectable in upper respiratory specimens during the acute phase of infection. The lowest concentration of SARS-CoV-2 viral copies this assay can detect is 138 copies/mL. A negative result does not preclude SARS-Cov-2 infection and should not be used as the sole basis for treatment or other patient management decisions.  A negative result may occur with  improper specimen collection/handling, submission of specimen other than nasopharyngeal swab, presence of viral mutation(s) within the areas targeted by this assay, and inadequate number of viral copies(<138 copies/mL). A negative result must be combined with clinical observations, patient history, and epidemiological information. The expected result is Negative.  Fact Sheet for Patients:  EntrepreneurPulse.com.au  Fact Sheet for Healthcare Providers:  IncredibleEmployment.be  This test is no t yet approved or cleared by the [place] FDA and  has been authorized for detection and/or diagnosis of SARS-CoV-2 by FDA under an Emergency Use Authorization (EUA). This EUA will remain  in effect (meaning this test can be used) for the duration of the COVID-19 declaration under Section 564(b)(1) of the Act, 21 U.S.C.section 360bbb-3(b)(1), unless the authorization is terminated  or revoked sooner.       Influenza A by PCR NEGATIVE NEGATIVE Final   Influenza B by PCR NEGATIVE NEGATIVE Final    Comment: (NOTE) The Xpert Xpress SARS-CoV-2/FLU/RSV plus assay is intended as an aid in the diagnosis of influenza from Nasopharyngeal swab specimens and should not be used as a sole basis for treatment. Nasal washings and aspirates are unacceptable for Xpert Xpress SARS-CoV-2/FLU/RSV testing.  Fact Sheet for Patients: EntrepreneurPulse.com.au  Fact Sheet for Healthcare Providers: IncredibleEmployment.be  This test is not yet approved or cleared by the [place] FDA and has been authorized for detection and/or diagnosis of SARS-CoV-2 by FDA under an Emergency Use Authorization (EUA). This EUA will remain in effect (meaning this test can be used) for the duration of the COVID-19 declaration under Section 564(b)(1) of the Act, 21 U.S.C. section 360bbb-3(b)(1), unless the authorization  is terminated or revoked.  Performed at [facility], [address] [place], [place] [address]   Blood culture (routine x 2)     Status: None   Collection Time: 04/10/22  4:37 PM   Specimen: BLOOD  Result Value Ref Range Status   Specimen Description BLOOD BLOOD LEFT ARM  Final   Special Requests   Final    BOTTLES DRAWN AEROBIC AND ANAEROBIC Blood Culture adequate volume   Culture   Final    NO GROWTH 5 DAYS Performed at [facility], [address], [place], [place] [address]    Report Status 04/15/2022 FINAL  Final  Blood culture (routine x 2)     Status: None   Collection Time: 04/10/22  4:37 PM   Specimen: BLOOD  Result Value Ref Range Status   Specimen Description BLOOD BLOOD LEFT ARM  Final   Special Requests   Final    BOTTLES DRAWN AEROBIC AND ANAEROBIC Blood Culture adequate volume   Culture   Final    NO GROWTH 5 DAYS Performed at [facility], [address] [place], [place] [address]    Report Status 04/15/2022 FINAL  Final  MRSA Next Gen by PCR, Nasal     Status: None   Collection Time: 04/10/22  6:53 PM   Specimen: Nasal Mucosa; Nasal Swab  Result Value Ref Range  Status   MRSA by PCR Next Gen NOT DETECTED NOT DETECTED Final    Comment: (NOTE) The GeneXpert MRSA Assay (FDA approved for NASAL specimens only), is one component of a comprehensive MRSA colonization surveillance program. It is not intended to diagnose MRSA infection nor to guide or monitor treatment for MRSA infections. Test performance is not FDA approved in patients less than 84 years old. Performed at [facility], [place], [place] [address]   C Difficile Quick Screen w PCR reflex     Status: None   Collection Time: 04/12/22 10:00 AM   Specimen: STOOL  Result Value Ref Range Status   C Diff antigen NEGATIVE NEGATIVE Final   C Diff toxin NEGATIVE NEGATIVE Final   C Diff interpretation No C. difficile detected.  Final    Comment:  Performed at [facility], [address] [place], [place] [address]  Respiratory (~20 pathogens) panel by PCR     Status: None   Collection Time: 04/13/22  5:15 AM   Specimen: Nasopharyngeal Swab; Respiratory  Result Value Ref Range Status   Adenovirus NOT DETECTED NOT DETECTED Final   Coronavirus 229E NOT DETECTED NOT DETECTED Final    Comment: (NOTE) The Coronavirus on the Respiratory Panel, DOES NOT test for the novel  Coronavirus (2019 nCoV)    Coronavirus HKU1 NOT DETECTED NOT DETECTED Final   Coronavirus NL63 NOT DETECTED NOT DETECTED Final   Coronavirus OC43 NOT DETECTED NOT DETECTED Final   Metapneumovirus NOT DETECTED NOT DETECTED Final   Rhinovirus / Enterovirus NOT DETECTED NOT DETECTED Final   Influenza A NOT DETECTED NOT DETECTED Final   Influenza B NOT DETECTED NOT DETECTED Final   Parainfluenza Virus 1 NOT DETECTED NOT DETECTED Final   Parainfluenza Virus 2 NOT DETECTED NOT DETECTED Final   Parainfluenza Virus 3 NOT DETECTED NOT DETECTED Final   Parainfluenza Virus 4 NOT DETECTED NOT DETECTED Final   Respiratory Syncytial Virus NOT DETECTED NOT DETECTED Final   Bordetella pertussis NOT DETECTED NOT DETECTED Final   Bordetella Parapertussis NOT DETECTED NOT DETECTED Final   Chlamydophila pneumoniae NOT DETECTED NOT DETECTED Final   Mycoplasma pneumoniae NOT DETECTED NOT DETECTED Final    Comment: Performed at [facility], [place]. [address] [place],  [address]    Coagulation Studies: No results for input(s): "LABPROT", "INR" in the last 72 hours.    Urinalysis: No results for input(s): "COLORURINE", "LABSPEC", "PHURINE", "GLUCOSEU", "HGBUR", "BILIRUBINUR", "KETONESUR", "PROTEINUR", "UROBILINOGEN", "NITRITE", "LEUKOCYTESUR" in the last 72 hours.  Invalid input(s): "APPERANCEUR"    Imaging: DG Chest Portable 1 View  Result Date: 04/28/2022 CLINICAL DATA:  Shortness of breath EXAM: PORTABLE CHEST 1 VIEW COMPARISON:  Previous studies  including the examination done earlier today FINDINGS: Transverse diameter of heart is in upper limits of normal. Central pulmonary vessels are slightly less prominent. There is decrease in prominence of interstitial markings in both lungs suggesting resolving pulmonary edema or resolving interstitial pneumonia. There is no new focal pulmonary consolidation. Lateral CP angles are clear. There is no pneumothorax. Tip of dialysis catheter is seen in right atrium. Vascular stents are noted in left upper arm. There is possible calcific bursitis in left shoulder. IMPRESSION: There is interval decrease in pulmonary vascular congestion and decrease in interstitial markings suggesting resolving CHF. Electronically Signed   By: Elmer Picker M.D.   On: 04/28/2022 12:50   DG Chest Port 1 View  Result Date: 04/28/2022 CLINICAL DATA:  Shortness of breath. EXAM:  PORTABLE CHEST 1 VIEW COMPARISON:  04/28/2022 FINDINGS: 0555 hours. Cardiopericardial silhouette is at upper limits of normal for size. Right IJ central line tip overlies the right atrium. Diffuse interstitial opacity persists with some subtle basilar airspace disease, right greater than left. Aeration at the left base is improved in the interval since prior study. Cardiopericardial silhouette is at upper limits of normal for size. Telemetry leads overlie the chest. IMPRESSION: Persistent diffuse interstitial opacity with subtle basilar airspace disease, right greater than left. Improved aeration at the left base since prior study. Electronically Signed   By: Misty Stanley M.D.   On: 04/28/2022 06:36   DG Chest Port 1 View  Result Date: 04/28/2022 CLINICAL DATA:  Shortness of breath EXAM: PORTABLE CHEST 1 VIEW COMPARISON:  04/26/2022 FINDINGS: Right dialysis catheter remains in place, unchanged. Heart is normal size. Small bilateral pleural effusions with bibasilar atelectasis or infiltrates. Mild interstitial prominence, similar to prior study. IMPRESSION:  Small effusions with bibasilar atelectasis or infiltrates. Stable mild interstitial prominence. Electronically Signed   By: Rolm Baptise M.D.   On: 04/28/2022 02:55     Medications:    anticoagulant sodium citrate      amLODipine  10 mg Oral Daily   aspirin EC  81 mg Oral Daily   atorvastatin  40 mg Oral Daily   calcium acetate  1,334 mg Oral TID WC   Chlorhexidine Gluconate Cloth  6 each Topical Q0600   cholecalciferol  5,000 Units Oral Daily   clopidogrel  75 mg Oral Daily   donepezil  10 mg Oral Daily   hydrALAZINE  25 mg Oral BID   insulin detemir  14 Units Subcutaneous QHS   levETIRAcetam  250 mg Oral Q T,Th,Sa-HD   levETIRAcetam  500 mg Oral Daily   levothyroxine  137 mcg Oral Q0600   linagliptin  5 mg Oral Daily   losartan  100 mg Oral QPM   metoprolol succinate  100 mg Oral Daily   multivitamin  1 tablet Oral Daily   pantoprazole  40 mg Oral Daily   sevelamer carbonate  2.4 g Oral Daily   alteplase, anticoagulant sodium citrate, heparin, ipratropium-albuterol, lidocaine (PF), lidocaine-prilocaine, pentafluoroprop-tetrafluoroeth . Chest x-ray done on March 09, 2022 IMPRESSION: 1. Cardiomegaly with vascular congestion and pulmonary edema 2. Worsening airspace disease at the right greater than left lung base, atelectasis versus pneumonia       Assessment/ Plan:  [name] is a 76 y.o.  male with a past medical history of hypertension, end-stage renal disease, on hemodialysis on Monday Wednesday Friday schedule, diabetes mellitus, CVA who was brought to the ER with chief complaint of weakness.  [facility]/TTS/right chest PermCath  End-stage renal disease on hemodialysis.         Patient will receive scheduled dialysis treatment today, in preparation for possible transfer to [place] system.  Patient appears short short of breath, will attempt UF 2.5 L.  UF reduced to 1-1.5 due to cramping.  Patient initially placed on BiPAP however BiPAP removed  after 1 hour of treatment.  Next treatment scheduled for Thursday.  2. Anemia of chronic kidney disease  Lab Results  Component Value Date   HGB 10.4 (L) 04/28/2022    Patient receives Mircera as an outpatient.  Hemoglobin 10.4.  Monitor hemoglobin.  3. Secondary Hyperparathyroidism: with outpatient labs: PTH 422, phosphorus 6.4, calcium 9.1 on 12/29/2021.   Lab Results  Component Value Date   CALCIUM 9.7 04/28/2022   CAION 0.83 (  LL) 12/10/2020   PHOS 2.6 04/14/2022    Continue calcium acetate and sevelamer with meals  4.  Hypertension with chronic kidney disease.  Home regimen includes hydralazine losartan, and metoprolol.   Maintain the patient on amlodipine, losartan, metoprolol.  Blood pressure currently 162/71 during dialysis  5. Diabetes mellitus type II with chronic kidney disease insulin dependent. Home regimen includes NovoLog and Levemir. Most recent hemoglobin A1c is 8.1 on 01/18/2022.  Currently prescribed Januvia outpatient.  Currently held.  Sliding scale insuline ordered by primary team.     LOS: 0   12/5/20234:30 PM

## 2022-04-28 NOTE — Progress Notes (Signed)
Pt did 3.5 hrs of HD, tolerated well but unable to reach goal due to cramping  UF = 1200 ml    04/28/22 1712  Vitals  BP (!) 183/78  MAP (mmHg) 104  BP Location Right Arm  BP Method Automatic  Patient Position (if appropriate) Lying  Pulse Rate 60  Pulse Rate Source Monitor  ECG Heart Rate 63  Resp 17  Oxygen Therapy  SpO2 95 %  O2 Device Room Air  Post Treatment  Dialyzer Clearance Lightly streaked  Duration of HD Treatment -hour(s) 3.5 hour(s)  Hemodialysis Intake (mL) 0 mL  Liters Processed 63  Fluid Removed (mL) 1200 mL  Tolerated HD Treatment Yes  Post-Hemodialysis Comments unable to reach goal due to cramping  Hemodialysis Catheter Right Subclavian Double lumen Permanent (Tunneled)  No placement date or time found.   Placed prior to admission: Yes  Orientation: Right  Access Location: Subclavian  Hemodialysis Catheter Type: Double lumen Permanent (Tunneled)  Discharged from Facility with Line Intact?: Yes  Site Condition No complications  Blue Lumen Status Heparin locked  Red Lumen Status Heparin locked  Catheter fill solution Heparin 1000 units/ml  Catheter fill volume (Arterial) 1.9 cc  Catheter fill volume (Venous) 1.9  Dressing Type Gauze/Drain sponge  Dressing Status Clean, Dry, Intact  Drainage Description None  Post treatment catheter status Capped and Clamped

## 2022-04-28 NOTE — ED Provider Notes (Addendum)
12:26 PM Assumed care for off going team.   Blood pressure (!) 179/97, pulse 76, temperature 98.5 F (36.9 C), temperature source Oral, resp. rate (!) 27, height '5\' 4"'$  (1.626 m), weight 65 kg, SpO2 90 %.  See their HPI for full report but in brief pending transfer to [facility]   .Critical Care  Performed by: Vanessa [place], MD Authorized by: Vanessa [place], MD   Critical care provider statement:    Critical care time (minutes):  30   Critical care was necessary to treat or prevent imminent or life-threatening deterioration of the following conditions:  Respiratory failure   Critical care was time spent personally by me on the following activities:  Development of treatment plan with patient or surrogate, discussions with consultants, evaluation of patient's response to treatment, examination of patient, ordering and review of laboratory studies, ordering and review of radiographic studies, ordering and performing treatments and interventions, pulse oximetry, re-evaluation of patient's condition and review of old charts    I got called to the room due to increasing shortness of breath.  Patient does have a little bit of tightness noted patient did not tolerate DuoNeb.  Patient still on his oxygen that was placed earlier.  Will add on repeat chest x-ray to see if any worsening edema could be due to not getting dialysis yet versus repeat EKG to make sure this is nonischemic and repeat labs.  Patient has not any additionally hypoxic but just has increased work of breathing.  Repeat chest x-ray personally evaluated the x-ray shows decrease in markings of CHF  Repeat EKG remains sinus with T wave inversions in the inferior lateral leads similar ST elevation in aVR.  I reviewed patient's prior EKG from December 5 at 241 where patient had T wave inversions in V4 through V6 but new inferior inversions   Review troponin is stable.  Will continue to trend these.  On repeat evaluation patient resting more  comfortably getting dialysis at bedside.  I did discuss the worsening EKG with Dr. Saunders Revel but no indication for Cath Lab just some ischemic changes patient is currently getting dialysis.  No indication for cath lab activation. Will try to update [facility] surgery.   I called over to [facility] to update them.  I asked if they want [place] to start on heparin at this time since troponin is stable we will hold off on starting heparin until we get approval from CT surgery given unclear when he would go to surgery.  Will continue to trend troponins to see if they are uptrending.  Will try to take patient off BiPAP after dialysis is complete.  3:17 PM I did discuss again with [facility] who wanted up the after the repeat troponin and the patient was able to come off BiPAP and see how he is doing.  Because this could potentially change the bed status of patient.  I did handed off to the oncoming team I did let the nurse know that we needed the repeat troponin and we can take him off BiPAP given he is resting more comfortably at this time.  If the troponin is uptrending at that time we will get CT surgery involved to see if they are okay with doing the heparin    Vanessa Clayton, MD 04/28/22 1446    Vanessa Botkins, MD 04/28/22 [phone]

## 2022-04-28 NOTE — ED Notes (Signed)
Lab called for hep blood to be drawn

## 2022-04-28 NOTE — ED Triage Notes (Addendum)
Pt brought in via ems from home.  Pt reports sob.  Pt has CABG scheduled next week at [facility].  Pt was seen in the ER yesterday with similar sx.  Pt alert.  Pt had dialysis 2 days ago.

## 2022-04-28 NOTE — ED Notes (Addendum)
Dialysis RN Rommel at bedside to begin dialysis. Patient appears to be breathing better on bipap and is not diaphoretic or restless any more at this time.

## 2022-04-28 NOTE — Inpatient Diabetes Management (Signed)
Inpatient Diabetes Program Recommendations  AACE/ADA: New Consensus Statement on Inpatient Glycemic Control (2015)  Target Ranges:  Prepandial:   less than 140 mg/dL      Peak postprandial:   less than 180 mg/dL (1-2 hours)      Critically ill patients:  140 - 180 mg/dL   Lab Results  Component Value Date   GLUCAP 117 (H) 04/22/2022   HGBA1C 5.9 (H) 04/11/2022    Review of Glycemic Control  Diabetes history: DM 2 Outpatient Diabetes medications: Levemir 14 units qhs, Januvia 50 mg qevening Current orders for Inpatient glycemic control:  Levemir 14 units qhs Tradjenta 5 mg Daily  A1c 5.9% on 11/18  Inpatient Diabetes Program Recommendations:    Looking at previous admission consider the following  -   Reduce Levemir to 10 units -   Start Novolog 0-6 units tid + hs scale starting at 151.  Thanks,  Tama Headings RN, MSN, BC-ADM Inpatient Diabetes Coordinator Team Pager [phone] (8a-5p)

## 2022-04-28 NOTE — ED Notes (Signed)
Patient resting in recliner with spouse at bedside and monitor in place. Pt free from sign of distress at this time.

## 2022-04-28 NOTE — Discharge Planning (Signed)
ESTABLISHED HEMODIALYSIS PATIENT:    Outpatient facility:  [facility]  [address] [place], [place] [address] [phone]   Treatment days: Tuesday, Thursday and Saturday   Chair time: 11:50am Clinic is aware of plan to transfer to [facility].

## 2022-04-28 NOTE — ED Notes (Signed)
Patient moved back to bed for comfort.  Meal tray ordered.

## 2022-04-28 NOTE — ED Notes (Signed)
Spoke with Carmell Austria in dialysis and she is going to send dialysis nurse to dialyze at bedside since patient is now on Bipap and unstable.

## 2022-04-28 NOTE — ED Notes (Signed)
called to [facility] for transport to [facility]/pt on wait list/rep:tammy

## 2022-04-28 NOTE — ED Notes (Signed)
Reports given to dialysis RN

## 2022-04-28 NOTE — ED Notes (Signed)
Spoke to [employer] patient will get a bed tonight just do not know what time.

## 2022-04-28 NOTE — ED Provider Notes (Signed)
[facility] Provider Note    Event Date/Time   First MD Initiated Contact with Patient 04/28/22 0304     (approximate)   History   Shortness of Breath   HPI  Joseph Mcpherson is a 75 y.o. male brought to the ED via EMS from home with a chief complaint of shortness of breath.  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  Recently hospitalized for non-ST EMI.  Seen by [place] cardiothoracic surgery yesterday who has scheduled patient for CABG next week.  It is thought that shortness of breath is his anginal equivalent and patient was told if he had shortness of breath requiring another ED visit that he is to be transferred to [facility] for hospitalization.  Patient denies fever, cough, chest pain, abdominal pain, nausea, vomiting or dizziness.     Past Medical History   Past Medical History:  Diagnosis Date   Clotting disorder ([place])    per daughter, "when patient travels, his feet swell"   Diabetes mellitus without complication ([place])    type II   Diabetic eye exam ([place]) 12/2019   ESRD (end stage renal disease) ([place])    MWF- [place]   GERD (gastroesophageal reflux disease)    [name] (hard of hearing)    Hyperlipidemia    Hypertension    Hypothyroidism    Thyroid disease      Active Problem List   Patient Active Problem List   Diagnosis Date Noted   Altered mental status 04/15/2022   Seizure ([place]) 04/13/2022   Myoclonus 04/13/2022   Abnormal EEG 04/13/2022   Leukocytosis 04/10/2022   Anemia due to chronic kidney disease 04/10/2022   Acute HFrEF (heart failure with reduced ejection fraction) ([place]) 04/10/2022   Hypoxic respiratory failure ([place]) 04/10/2022   Pneumonia due to COVID-19 virus 03/22/2022   Acute respiratory failure with hypoxia ([place]) 03/22/2022   Dementia ([place]) 03/22/2022   Multifocal pneumonia 03/22/2022   CAD (coronary artery disease) 03/22/2022   NSTEMI (non-ST elevated myocardial infarction) ([place]) 03/09/2022   Acute clinical systolic  heart failure ([place]) 03/09/2022   Volume overload 03/09/2022   CAP (community acquired pneumonia) 03/09/2022   Febrile illness    Cholecystitis, acute    Hyperkalemia 01/18/2022   Generalized weakness 01/18/2022   Hyponatremia 01/18/2022   Acute bronchitis and bronchiolitis 06/16/2021   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   Allergies 10/28/2020   Gastroesophageal reflux disease with esophagitis 07/18/2020   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   Annual physical exam 03/17/2020   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   Acute CVA (cerebrovascular accident) ([place]) 05/04/2019   History of CVA (cerebrovascular accident) 01/75/1025   Complication of vascular access for dialysis 04/27/2019   Surgery follow-up examination 03/09/2019   End stage renal disease ([place]) 02/09/2018   Anemia in chronic kidney disease 02/09/2018   Pruritus, unspecified 02/09/2018   Peripheral vascular disease ([place]) 03/16/2016   Headache, unspecified 03/16/2016   Diabetic neuropathy associated with diabetes mellitus due to underlying condition ([place]) 03/16/2016   Mixed hyperlipidemia 07/15/2015   Hypothyroidism 07/14/2015   Hearing impaired 03/19/2015   Essential hypertension 11/27/2014   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   Pulmonary embolism ([place]) 09/13/2014   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[place]) 05/31/2014     Past Surgical History   Past Surgical History:  Procedure Laterality Date   AV FISTULA PLACEMENT Right 10/22/2017   Procedure: ARTERIOVENOUS (AV) BRACHIOCEPHALIC FISTULA CREATION RIGHT  UPPER ARM;  Surgeon: Conrad Des Lacs, MD;  Location: [place];  Service: Vascular;  Laterality: Right;   AV FISTULA PLACEMENT Left 02/03/2019   Procedure: INSERTION OF ARTERIOVENOUS (AV) GORE-TEX GRAFT ARM (BRACHIAL AXILLARY );  Surgeon: [name], MD;  Location: [facility];  Service: Vascular;  Laterality: Left;   AV FISTULA PLACEMENT Left  12/10/2020   Procedure: LEFT ARM BRACHIOCEPHALIC ARTERIOVENOUS (AV) FISTULA CREATION;  Surgeon: [name], MD;  Location: [place];  Service: Vascular;  Laterality: Left;   IR CHOLANGIOGRAM EXISTING TUBE  03/18/2022   IR PERC CHOLECYSTOSTOMY  01/21/2022   LEFT HEART CATH AND CORONARY ANGIOGRAPHY N/A 04/22/2022   Procedure: LEFT HEART CATH AND CORONARY ANGIOGRAPHY;  Surgeon: Isaias Cowman, MD;  Location: [place] [facility];  Service: Cardiovascular;  Laterality: N/A;   UPPER EXTREMITY ANGIOGRAPHY Left 09/19/2019   Procedure: UPPER EXTREMITY ANGIOGRAPHY;  Surgeon: [name], MD;  Location: [place] [facility];  Service: Cardiovascular;  Laterality: Left;     Home Medications   Prior to Admission medications   Medication Sig Start Date End Date Taking? Authorizing Provider  albuterol (PROVENTIL) (2.5 MG/3ML) 0.083% nebulizer solution Take 3 mLs (2.5 mg total) by nebulization every 6 (six) hours as needed for wheezing or shortness of breath. 03/13/22   Lorella Nimrod, MD  amLODipine (NORVASC) 10 MG tablet Take 1 tablet (10 mg total) by mouth daily. 02/02/22   Fritzi Mandes, MD  aspirin EC 81 MG tablet Take 81 mg by mouth daily. Swallow whole.    [provider]  atorvastatin (LIPITOR) 40 MG tablet Take 1 tablet (40 mg total) by mouth daily. 03/14/22   Lorella Nimrod, MD  B Complex-C-Folic Acid (RENA-VITE RX) 1 MG TABS Take 1 tablet by mouth daily.     [provider]  BD PEN NEEDLE NANO 2ND GEN 32G X 4 MM MISC USE AS DIRECTED 02/26/22   [name], MD  calcium acetate (PHOSLO) 667 MG capsule TAKE 2 CAPSULES BY MOUTH 3 TIMES A DAY WITH MEAL 02/16/22   [name], MD  Cholecalciferol (VITAMIN D3) 5000 units TABS Take 5,000 Units by mouth daily.     [provider]  clopidogrel (PLAVIX) 75 MG tablet Take 1 tablet (75 mg total) by mouth daily. 03/14/22   Lorella Nimrod, MD  Continuous Blood Gluc Sensor (FREESTYLE LIBRE 14 DAY SENSOR) MISC APPLY EVERY 14  (FOURTEEN) DAYS. 02/02/22   [name], MD  donepezil (ARICEPT) 10 MG tablet TAKE 1 TABLET BY MOUTH EVERY DAY 04/22/21   [name], MD  insulin detemir (LEVEMIR) 100 UNIT/ML injection Inject 0.15 mLs (15 Units total) into the skin at bedtime. 02/02/22   Fritzi Mandes, MD  ipratropium-albuterol (DUONEB) 0.5-2.5 (3) MG/3ML SOLN Take 3 mLs by nebulization every 6 (six) hours as needed. 03/05/22   [name], MD  isosorbide-hydrALAZINE (BIDIL) 20-37.5 MG tablet Take 1 tablet by mouth 3 (three) times daily. 04/16/22 05/16/22  Shelly Coss, MD  levETIRAcetam (KEPPRA) 250 MG tablet Take 1 tablet (250 mg total) by mouth 3 (three) times a week. On Tuesday ,Thursday and Saturday after Hemodialysis 04/17/22   Shelly Coss, MD  levETIRAcetam (KEPPRA) 500 MG tablet Take 1 tablet (500 mg total) by mouth daily. Patient taking differently: Take 500 mg by mouth daily. On Tuesday ,Thursday and Saturday 04/16/22 06/15/22  Shelly Coss, MD  levothyroxine (SYNTHROID) 137 MCG tablet TAKE 1 TABLET BY MOUTH EVERY DAY 03/24/21   [name], MD  losartan (COZAAR) 100 MG tablet  Take 1 tablet (100 mg total) by mouth every evening. 02/02/22   Fritzi Mandes, MD  metoprolol succinate (TOPROL-XL) 100 MG 24 hr tablet Take 1 tablet (100 mg total) by mouth daily. Take with or immediately following a meal. 04/16/22 05/16/22  Shelly Coss, MD  multivitamin (RENA-VIT) TABS tablet Take 1 tablet by mouth daily. Patient not taking: Reported on 04/22/2022 04/15/22   Shelly Coss, MD  pantoprazole (PROTONIX) 40 MG tablet TAKE 1 TABLET BY MOUTH EVERY DAY 07/09/21   [name], MD  sevelamer carbonate (RENVELA) 2.4 g PACK Take 2.4 g by mouth daily. 02/25/22   [name], MD  sitaGLIPtin (JANUVIA) 50 MG tablet Take 50 mg by mouth every evening.     [provider]  sodium chloride flush (NS) 0.9 % SOLN Inject 5 mLs into the vein daily. Flush cholecystostomy tube daily with 5 ccs of NS Patient not taking:  Reported on 04/22/2022 03/26/22 05/25/22  Wyvonnia Dusky, MD     Allergies  Patient has no known allergies.   Family History   Family History  Problem Relation Age of Onset   Diabetes Mother    Hyperlipidemia Mother    Hypertension Father      Physical Exam  Triage Vital Signs: ED Triage Vitals  Enc Vitals Group     BP 04/28/22 0232 (!) 156/65     Pulse Rate 04/28/22 0232 71     Resp 04/28/22 0232 20     Temp 04/28/22 0232 98 F (36.7 C)     Temp Source 04/28/22 0232 Oral     SpO2 04/28/22 0232 92 %     Weight 04/28/22 0226 143 lb 4.8 oz (65 kg)     Height 04/28/22 0226 '5\' 4"'$  (1.626 m)     Head Circumference --      Peak Flow --      Pain Score 04/28/22 0226 0     Pain Loc --      Pain Edu? --      Excl. in [place]? --     Updated Vital Signs: BP (!) 208/95   Pulse 85   Temp 98 F (36.7 C) (Oral)   Resp (!) 25   Ht '5\' 4"'$  (1.626 m)   Wt 65 kg   SpO2 100%   BMI 24.60 kg/m    General: Awake, no distress.  CV:  RRR.  Good peripheral perfusion.  Resp:  Normal effort.  CTA B. Abd:  Nontender.  No distention.  Other:  Bilateral calves are nontender and not swollen.   ED Results / Procedures / Treatments  Labs (all labs ordered are listed, but only abnormal results are displayed) Labs Reviewed  BASIC METABOLIC PANEL - Abnormal; Notable for the following components:      Result Value   Glucose, Bld 268 (*)    BUN 24 (*)    Creatinine, Ser 7.99 (*)    GFR, Estimated 6 (*)    All other components within normal limits  CBC - Abnormal; Notable for the following components:   WBC 11.9 (*)    RBC 3.35 (*)    Hemoglobin 9.0 (*)    HCT 28.6 (*)    RDW 16.5 (*)    All other components within normal limits  TROPONIN I (HIGH SENSITIVITY) - Abnormal; Notable for the following components:   Troponin I (High Sensitivity) 53 (*)    All other components within normal limits  TROPONIN I (HIGH SENSITIVITY) - Abnormal; Notable for the  following components:   Troponin  I (High Sensitivity) 50 (*)    All other components within normal limits     EKG  ED ECG REPORT I, Amica Harron J, the attending physician, personally viewed and interpreted this ECG.   Date: 04/28/2022  EKG Time: 0241  Rate: 72  Rhythm: normal sinus rhythm  Axis: RAD  Intervals:none  ST&T Change: T wave inversion inferior lateral leads No significant change from EKG dated 04/26/2022   RADIOLOGY I have independently visualized and interpreted patient's chest x-ray as well as noted the radiology interpretation:  Chest x-ray: Small bibasilar pleural effusions  Official radiology report(s): DG Chest Port 1 View  Result Date: 04/28/2022 CLINICAL DATA:  Shortness of breath. EXAM: PORTABLE CHEST 1 VIEW COMPARISON:  04/28/2022 FINDINGS: 0555 hours. Cardiopericardial silhouette is at upper limits of normal for size. Right IJ central line tip overlies the right atrium. Diffuse interstitial opacity persists with some subtle basilar airspace disease, right greater than left. Aeration at the left base is improved in the interval since prior study. Cardiopericardial silhouette is at upper limits of normal for size. Telemetry leads overlie the chest. IMPRESSION: Persistent diffuse interstitial opacity with subtle basilar airspace disease, right greater than left. Improved aeration at the left base since prior study. Electronically Signed   By: Misty Stanley M.D.   On: 04/28/2022 06:36   DG Chest Port 1 View  Result Date: 04/28/2022 CLINICAL DATA:  Shortness of breath EXAM: PORTABLE CHEST 1 VIEW COMPARISON:  04/26/2022 FINDINGS: Right dialysis catheter remains in place, unchanged. Heart is normal size. Small bilateral pleural effusions with bibasilar atelectasis or infiltrates. Mild interstitial prominence, similar to prior study. IMPRESSION: Small effusions with bibasilar atelectasis or infiltrates. Stable mild interstitial prominence. Electronically Signed   By: Rolm Baptise M.D.   On: 04/28/2022  02:55     PROCEDURES:  Critical Care performed: Yes, see critical care procedure note(s)  CRITICAL CARE Performed by: Paulette Blanch   Total critical care time: 45 minutes  Critical care time was exclusive of separately billable procedures and treating other patients.  Critical care was necessary to treat or prevent imminent or life-threatening deterioration.  Critical care was time spent personally by me on the following activities: development of treatment plan with patient and/or surrogate as well as nursing, discussions with consultants, evaluation of patient's response to treatment, examination of patient, obtaining history from patient or surrogate, ordering and performing treatments and interventions, ordering and review of laboratory studies, ordering and review of radiographic studies, pulse oximetry and re-evaluation of patient's condition.   Marland Kitchen1-3 Lead EKG Interpretation  Performed by: Paulette Blanch, MD Authorized by: Paulette Blanch, MD     Interpretation: normal     ECG rate:  70   ECG rate assessment: normal     Rhythm: sinus rhythm     Ectopy: none     Conduction: normal   Comments:     Patient placed on cardiac monitor to evaluate for arrhythmias    MEDICATIONS ORDERED IN ED: Medications  aspirin chewable tablet 324 mg (324 mg Oral Given 04/28/22 0324)     IMPRESSION / MDM / ASSESSMENT AND PLAN / ED COURSE  I reviewed the triage vital signs and the nursing notes.                             76 year old male presenting with shortness of breath. Differential includes, but is not limited to, viral syndrome,  bronchitis including COPD exacerbation, pneumonia, reactive airway disease including asthma, CHF including exacerbation with or without pulmonary/interstitial edema, pneumothorax, ACS, thoracic trauma, and pulmonary embolism.  I have personally reviewed patient's records and note his visit with cardiothoracic surgery from yesterday.  Patient's presentation is  most consistent with acute presentation with potential threat to life or bodily function.  The patient is on the cardiac monitor to evaluate for evidence of arrhythmia and/or significant heart rate changes.  We will obtain cardiac panel, chest x-ray.  Administer 4 baby aspirin.  Patient and his family are under the expectations that he will be transferred to [facility].  I will be happy to call and speak with the cardiologist after troponins are back.  Clinical Course as of 04/28/22 0659  Tue Apr 28, 2022  0502 Repeat troponin is 50.  Will call [facility] transfer center to speak with [facility] cardiology. [JS]  [phone] Spoke with Dr. Margaretmary Lombard from [facility] cardiology who will accept patient in transfer. He does recommend reaching out to CT surgery to see if they will accept the patient on their service as there are currently no cardiology beds available and patient will have to be placed on a wait list. [JS]  [phone] Patient now requiring 4 L nasal cannula oxygen for room air saturations 84% checked 10 minutes ago.  Will obtain repeat chest x-ray.  Have updated patient and spouse regarding speaking with cardiology and acceptance to [facility]. [JS]  [id number] Still awaiting callback from cardiothoracic surgery at [facility].  In the meantime, I have spoken with Dr. Candiss Norse from nephrology who will arrange for hemodialysis this morning. [JS]  [id number] Accepted to [facility] surgery by Dr. Antonieta Iba.  No beds available this morning so we will proceed with hemodialysis at our facility this morning.  Patient and spouse updated and agreeable with plan of care. [JS]    Clinical Course User Index [JS] Paulette Blanch, MD     FINAL CLINICAL IMPRESSION(S) / ED DIAGNOSES   Final diagnoses:  Shortness of breath  Coronary artery disease involving native heart with other form of angina pectoris, unspecified vessel or lesion type ([place])  Hypoxia  ESRD (end stage renal disease) on dialysis [facility])     Rx / DC Orders   ED Discharge Orders     None         Note:  This document was prepared using Dragon voice recognition software and may include unintentional dictation errors.   Paulette Blanch, MD 04/28/22 [phone]

## 2022-04-29 DIAGNOSIS — I214 Non-ST elevation (NSTEMI) myocardial infarction: Secondary | ICD-10-CM | POA: Diagnosis not present

## 2022-04-29 DIAGNOSIS — Z01810 Encounter for preprocedural cardiovascular examination: Secondary | ICD-10-CM | POA: Diagnosis not present

## 2022-04-29 DIAGNOSIS — R918 Other nonspecific abnormal finding of lung field: Secondary | ICD-10-CM | POA: Diagnosis not present

## 2022-04-29 DIAGNOSIS — J90 Pleural effusion, not elsewhere classified: Secondary | ICD-10-CM | POA: Diagnosis not present

## 2022-04-29 DIAGNOSIS — F02A0 Dementia in other diseases classified elsewhere, mild, without behavioral disturbance, psychotic disturbance, mood disturbance, and anxiety: Secondary | ICD-10-CM | POA: Diagnosis not present

## 2022-04-29 DIAGNOSIS — I2510 Atherosclerotic heart disease of native coronary artery without angina pectoris: Secondary | ICD-10-CM | POA: Diagnosis not present

## 2022-04-29 DIAGNOSIS — G300 Alzheimer's disease with early onset: Secondary | ICD-10-CM | POA: Diagnosis not present

## 2022-04-29 DIAGNOSIS — J9811 Atelectasis: Secondary | ICD-10-CM | POA: Diagnosis not present

## 2022-04-29 DIAGNOSIS — N186 End stage renal disease: Secondary | ICD-10-CM | POA: Diagnosis not present

## 2022-04-29 DIAGNOSIS — Z992 Dependence on renal dialysis: Secondary | ICD-10-CM | POA: Diagnosis not present

## 2022-04-29 DIAGNOSIS — Z01818 Encounter for other preprocedural examination: Secondary | ICD-10-CM | POA: Diagnosis not present

## 2022-04-29 LAB — HEPATITIS B SURFACE ANTIBODY, QUANTITATIVE: Hep B S AB Quant (Post): 91.7 m[IU]/mL (ref >9.9)

## 2022-04-30 DIAGNOSIS — Z992 Dependence on renal dialysis: Secondary | ICD-10-CM | POA: Diagnosis not present

## 2022-04-30 DIAGNOSIS — N186 End stage renal disease: Secondary | ICD-10-CM | POA: Diagnosis not present

## 2022-05-01 DIAGNOSIS — I2510 Atherosclerotic heart disease of native coronary artery without angina pectoris: Secondary | ICD-10-CM | POA: Diagnosis not present

## 2022-05-01 DIAGNOSIS — R29818 Other symptoms and signs involving the nervous system: Secondary | ICD-10-CM | POA: Diagnosis not present

## 2022-05-01 DIAGNOSIS — I5022 Chronic systolic (congestive) heart failure: Secondary | ICD-10-CM | POA: Diagnosis not present

## 2022-05-01 DIAGNOSIS — I6621 Occlusion and stenosis of right posterior cerebral artery: Secondary | ICD-10-CM | POA: Diagnosis not present

## 2022-05-01 DIAGNOSIS — R569 Unspecified convulsions: Secondary | ICD-10-CM | POA: Diagnosis not present

## 2022-05-02 DIAGNOSIS — N186 End stage renal disease: Secondary | ICD-10-CM | POA: Diagnosis not present

## 2022-05-02 DIAGNOSIS — J9601 Acute respiratory failure with hypoxia: Secondary | ICD-10-CM | POA: Diagnosis not present

## 2022-05-02 DIAGNOSIS — E1165 Type 2 diabetes mellitus with hyperglycemia: Secondary | ICD-10-CM | POA: Diagnosis not present

## 2022-05-02 DIAGNOSIS — Z992 Dependence on renal dialysis: Secondary | ICD-10-CM | POA: Diagnosis not present

## 2022-05-02 DIAGNOSIS — E785 Hyperlipidemia, unspecified: Secondary | ICD-10-CM | POA: Diagnosis not present

## 2022-05-02 DIAGNOSIS — Z951 Presence of aortocoronary bypass graft: Secondary | ICD-10-CM | POA: Diagnosis not present

## 2022-05-02 DIAGNOSIS — I132 Hypertensive heart and chronic kidney disease with heart failure and with stage 5 chronic kidney disease, or end stage renal disease: Secondary | ICD-10-CM | POA: Diagnosis not present

## 2022-05-02 DIAGNOSIS — D696 Thrombocytopenia, unspecified: Secondary | ICD-10-CM | POA: Diagnosis not present

## 2022-05-02 DIAGNOSIS — R404 Transient alteration of awareness: Secondary | ICD-10-CM | POA: Diagnosis not present

## 2022-05-02 DIAGNOSIS — D62 Acute posthemorrhagic anemia: Secondary | ICD-10-CM | POA: Diagnosis not present

## 2022-05-02 DIAGNOSIS — T50905A Adverse effect of unspecified drugs, medicaments and biological substances, initial encounter: Secondary | ICD-10-CM | POA: Diagnosis not present

## 2022-05-02 DIAGNOSIS — R569 Unspecified convulsions: Secondary | ICD-10-CM | POA: Diagnosis not present

## 2022-05-02 DIAGNOSIS — R410 Disorientation, unspecified: Secondary | ICD-10-CM | POA: Diagnosis not present

## 2022-05-02 DIAGNOSIS — I5023 Acute on chronic systolic (congestive) heart failure: Secondary | ICD-10-CM | POA: Diagnosis not present

## 2022-05-02 DIAGNOSIS — R489 Unspecified symbolic dysfunctions: Secondary | ICD-10-CM | POA: Diagnosis not present

## 2022-05-02 DIAGNOSIS — I214 Non-ST elevation (NSTEMI) myocardial infarction: Secondary | ICD-10-CM | POA: Diagnosis not present

## 2022-05-03 DIAGNOSIS — E785 Hyperlipidemia, unspecified: Secondary | ICD-10-CM | POA: Diagnosis not present

## 2022-05-03 DIAGNOSIS — D62 Acute posthemorrhagic anemia: Secondary | ICD-10-CM | POA: Diagnosis not present

## 2022-05-03 DIAGNOSIS — E1165 Type 2 diabetes mellitus with hyperglycemia: Secondary | ICD-10-CM | POA: Diagnosis not present

## 2022-05-03 DIAGNOSIS — I132 Hypertensive heart and chronic kidney disease with heart failure and with stage 5 chronic kidney disease, or end stage renal disease: Secondary | ICD-10-CM | POA: Diagnosis not present

## 2022-05-03 DIAGNOSIS — I2510 Atherosclerotic heart disease of native coronary artery without angina pectoris: Secondary | ICD-10-CM | POA: Diagnosis not present

## 2022-05-03 DIAGNOSIS — I214 Non-ST elevation (NSTEMI) myocardial infarction: Secondary | ICD-10-CM | POA: Diagnosis not present

## 2022-05-03 DIAGNOSIS — N186 End stage renal disease: Secondary | ICD-10-CM | POA: Diagnosis not present

## 2022-05-03 DIAGNOSIS — I5023 Acute on chronic systolic (congestive) heart failure: Secondary | ICD-10-CM | POA: Diagnosis not present

## 2022-05-03 DIAGNOSIS — Z4682 Encounter for fitting and adjustment of non-vascular catheter: Secondary | ICD-10-CM | POA: Diagnosis not present

## 2022-05-03 DIAGNOSIS — Z992 Dependence on renal dialysis: Secondary | ICD-10-CM | POA: Diagnosis not present

## 2022-05-03 DIAGNOSIS — R489 Unspecified symbolic dysfunctions: Secondary | ICD-10-CM | POA: Diagnosis not present

## 2022-05-03 DIAGNOSIS — J811 Chronic pulmonary edema: Secondary | ICD-10-CM | POA: Diagnosis not present

## 2022-05-03 DIAGNOSIS — D696 Thrombocytopenia, unspecified: Secondary | ICD-10-CM | POA: Diagnosis not present

## 2022-05-03 DIAGNOSIS — J9601 Acute respiratory failure with hypoxia: Secondary | ICD-10-CM | POA: Diagnosis not present

## 2022-05-04 DIAGNOSIS — Z4659 Encounter for fitting and adjustment of other gastrointestinal appliance and device: Secondary | ICD-10-CM | POA: Diagnosis not present

## 2022-05-04 DIAGNOSIS — R489 Unspecified symbolic dysfunctions: Secondary | ICD-10-CM | POA: Diagnosis not present

## 2022-05-04 DIAGNOSIS — Z4682 Encounter for fitting and adjustment of non-vascular catheter: Secondary | ICD-10-CM | POA: Diagnosis not present

## 2022-05-04 DIAGNOSIS — N186 End stage renal disease: Secondary | ICD-10-CM | POA: Diagnosis not present

## 2022-05-04 DIAGNOSIS — Z992 Dependence on renal dialysis: Secondary | ICD-10-CM | POA: Diagnosis not present

## 2022-05-04 DIAGNOSIS — R918 Other nonspecific abnormal finding of lung field: Secondary | ICD-10-CM | POA: Diagnosis not present

## 2022-05-04 DIAGNOSIS — E1122 Type 2 diabetes mellitus with diabetic chronic kidney disease: Secondary | ICD-10-CM | POA: Diagnosis not present

## 2022-05-04 DIAGNOSIS — Z951 Presence of aortocoronary bypass graft: Secondary | ICD-10-CM | POA: Diagnosis not present

## 2022-05-04 NOTE — Telephone Encounter (Signed)
Msg sent to provder

## 2022-05-05 DIAGNOSIS — Z4682 Encounter for fitting and adjustment of non-vascular catheter: Secondary | ICD-10-CM | POA: Diagnosis not present

## 2022-05-05 DIAGNOSIS — R489 Unspecified symbolic dysfunctions: Secondary | ICD-10-CM | POA: Diagnosis not present

## 2022-05-05 DIAGNOSIS — E1122 Type 2 diabetes mellitus with diabetic chronic kidney disease: Secondary | ICD-10-CM | POA: Diagnosis not present

## 2022-05-05 DIAGNOSIS — N186 End stage renal disease: Secondary | ICD-10-CM | POA: Diagnosis not present

## 2022-05-05 DIAGNOSIS — Z951 Presence of aortocoronary bypass graft: Secondary | ICD-10-CM | POA: Diagnosis not present

## 2022-05-05 DIAGNOSIS — Z4659 Encounter for fitting and adjustment of other gastrointestinal appliance and device: Secondary | ICD-10-CM | POA: Diagnosis not present

## 2022-05-05 DIAGNOSIS — Z992 Dependence on renal dialysis: Secondary | ICD-10-CM | POA: Diagnosis not present

## 2022-05-05 DIAGNOSIS — R918 Other nonspecific abnormal finding of lung field: Secondary | ICD-10-CM | POA: Diagnosis not present

## 2022-05-06 DIAGNOSIS — E1122 Type 2 diabetes mellitus with diabetic chronic kidney disease: Secondary | ICD-10-CM | POA: Diagnosis not present

## 2022-05-06 DIAGNOSIS — Z4659 Encounter for fitting and adjustment of other gastrointestinal appliance and device: Secondary | ICD-10-CM | POA: Diagnosis not present

## 2022-05-06 DIAGNOSIS — Z992 Dependence on renal dialysis: Secondary | ICD-10-CM | POA: Diagnosis not present

## 2022-05-06 DIAGNOSIS — R489 Unspecified symbolic dysfunctions: Secondary | ICD-10-CM | POA: Diagnosis not present

## 2022-05-06 DIAGNOSIS — N186 End stage renal disease: Secondary | ICD-10-CM | POA: Diagnosis not present

## 2022-05-06 DIAGNOSIS — Z951 Presence of aortocoronary bypass graft: Secondary | ICD-10-CM | POA: Diagnosis not present

## 2022-05-07 DIAGNOSIS — R1310 Dysphagia, unspecified: Secondary | ICD-10-CM | POA: Diagnosis not present

## 2022-05-07 DIAGNOSIS — N186 End stage renal disease: Secondary | ICD-10-CM | POA: Diagnosis not present

## 2022-05-07 DIAGNOSIS — J90 Pleural effusion, not elsewhere classified: Secondary | ICD-10-CM | POA: Diagnosis not present

## 2022-05-07 DIAGNOSIS — Z951 Presence of aortocoronary bypass graft: Secondary | ICD-10-CM | POA: Diagnosis not present

## 2022-05-07 DIAGNOSIS — R918 Other nonspecific abnormal finding of lung field: Secondary | ICD-10-CM | POA: Diagnosis not present

## 2022-05-07 DIAGNOSIS — E1122 Type 2 diabetes mellitus with diabetic chronic kidney disease: Secondary | ICD-10-CM | POA: Diagnosis not present

## 2022-05-07 DIAGNOSIS — R059 Cough, unspecified: Secondary | ICD-10-CM | POA: Diagnosis not present

## 2022-05-07 DIAGNOSIS — Z4682 Encounter for fitting and adjustment of non-vascular catheter: Secondary | ICD-10-CM | POA: Diagnosis not present

## 2022-05-07 DIAGNOSIS — Z452 Encounter for adjustment and management of vascular access device: Secondary | ICD-10-CM | POA: Diagnosis not present

## 2022-05-08 DIAGNOSIS — N186 End stage renal disease: Secondary | ICD-10-CM | POA: Diagnosis not present

## 2022-05-08 DIAGNOSIS — E1122 Type 2 diabetes mellitus with diabetic chronic kidney disease: Secondary | ICD-10-CM | POA: Diagnosis not present

## 2022-05-08 DIAGNOSIS — Z992 Dependence on renal dialysis: Secondary | ICD-10-CM | POA: Diagnosis not present

## 2022-05-09 DIAGNOSIS — Z4682 Encounter for fitting and adjustment of non-vascular catheter: Secondary | ICD-10-CM | POA: Diagnosis not present

## 2022-05-09 DIAGNOSIS — J90 Pleural effusion, not elsewhere classified: Secondary | ICD-10-CM | POA: Diagnosis not present

## 2022-05-09 DIAGNOSIS — Z951 Presence of aortocoronary bypass graft: Secondary | ICD-10-CM | POA: Diagnosis not present

## 2022-05-09 DIAGNOSIS — Z452 Encounter for adjustment and management of vascular access device: Secondary | ICD-10-CM | POA: Diagnosis not present

## 2022-05-09 DIAGNOSIS — R918 Other nonspecific abnormal finding of lung field: Secondary | ICD-10-CM | POA: Diagnosis not present

## 2022-05-10 DIAGNOSIS — Z951 Presence of aortocoronary bypass graft: Secondary | ICD-10-CM | POA: Diagnosis not present

## 2022-05-10 DIAGNOSIS — J90 Pleural effusion, not elsewhere classified: Secondary | ICD-10-CM | POA: Diagnosis not present

## 2022-05-11 DIAGNOSIS — N186 End stage renal disease: Secondary | ICD-10-CM | POA: Diagnosis not present

## 2022-05-11 DIAGNOSIS — R489 Unspecified symbolic dysfunctions: Secondary | ICD-10-CM | POA: Diagnosis not present

## 2022-05-11 DIAGNOSIS — J811 Chronic pulmonary edema: Secondary | ICD-10-CM | POA: Diagnosis not present

## 2022-05-11 DIAGNOSIS — Z992 Dependence on renal dialysis: Secondary | ICD-10-CM | POA: Diagnosis not present

## 2022-05-11 DIAGNOSIS — Z951 Presence of aortocoronary bypass graft: Secondary | ICD-10-CM | POA: Diagnosis not present

## 2022-05-11 DIAGNOSIS — J90 Pleural effusion, not elsewhere classified: Secondary | ICD-10-CM | POA: Diagnosis not present

## 2022-05-11 DIAGNOSIS — E1122 Type 2 diabetes mellitus with diabetic chronic kidney disease: Secondary | ICD-10-CM | POA: Diagnosis not present

## 2022-05-12 DIAGNOSIS — J90 Pleural effusion, not elsewhere classified: Secondary | ICD-10-CM | POA: Diagnosis not present

## 2022-05-12 DIAGNOSIS — Z951 Presence of aortocoronary bypass graft: Secondary | ICD-10-CM | POA: Diagnosis not present

## 2022-05-12 DIAGNOSIS — Z4682 Encounter for fitting and adjustment of non-vascular catheter: Secondary | ICD-10-CM | POA: Diagnosis not present

## 2022-05-12 DIAGNOSIS — D631 Anemia in chronic kidney disease: Secondary | ICD-10-CM | POA: Diagnosis not present

## 2022-05-12 DIAGNOSIS — E1122 Type 2 diabetes mellitus with diabetic chronic kidney disease: Secondary | ICD-10-CM | POA: Diagnosis not present

## 2022-05-12 DIAGNOSIS — Z452 Encounter for adjustment and management of vascular access device: Secondary | ICD-10-CM | POA: Diagnosis not present

## 2022-05-12 DIAGNOSIS — N186 End stage renal disease: Secondary | ICD-10-CM | POA: Diagnosis not present

## 2022-05-13 DIAGNOSIS — Z951 Presence of aortocoronary bypass graft: Secondary | ICD-10-CM | POA: Diagnosis not present

## 2022-05-13 DIAGNOSIS — J9811 Atelectasis: Secondary | ICD-10-CM | POA: Diagnosis not present

## 2022-05-13 DIAGNOSIS — D631 Anemia in chronic kidney disease: Secondary | ICD-10-CM | POA: Diagnosis not present

## 2022-05-13 DIAGNOSIS — J90 Pleural effusion, not elsewhere classified: Secondary | ICD-10-CM | POA: Diagnosis not present

## 2022-05-13 DIAGNOSIS — N186 End stage renal disease: Secondary | ICD-10-CM | POA: Diagnosis not present

## 2022-05-13 DIAGNOSIS — Z992 Dependence on renal dialysis: Secondary | ICD-10-CM | POA: Diagnosis not present

## 2022-05-13 DIAGNOSIS — E1122 Type 2 diabetes mellitus with diabetic chronic kidney disease: Secondary | ICD-10-CM | POA: Diagnosis not present

## 2022-05-14 DIAGNOSIS — N186 End stage renal disease: Secondary | ICD-10-CM | POA: Diagnosis not present

## 2022-05-14 DIAGNOSIS — Z992 Dependence on renal dialysis: Secondary | ICD-10-CM | POA: Diagnosis not present

## 2022-05-14 DIAGNOSIS — E1122 Type 2 diabetes mellitus with diabetic chronic kidney disease: Secondary | ICD-10-CM | POA: Diagnosis not present

## 2022-05-14 DIAGNOSIS — D631 Anemia in chronic kidney disease: Secondary | ICD-10-CM | POA: Diagnosis not present

## 2022-05-15 DIAGNOSIS — Z992 Dependence on renal dialysis: Secondary | ICD-10-CM | POA: Diagnosis not present

## 2022-05-15 DIAGNOSIS — N186 End stage renal disease: Secondary | ICD-10-CM | POA: Diagnosis not present

## 2022-05-15 DIAGNOSIS — J90 Pleural effusion, not elsewhere classified: Secondary | ICD-10-CM | POA: Diagnosis not present

## 2022-05-15 DIAGNOSIS — D631 Anemia in chronic kidney disease: Secondary | ICD-10-CM | POA: Diagnosis not present

## 2022-05-15 DIAGNOSIS — J9811 Atelectasis: Secondary | ICD-10-CM | POA: Diagnosis not present

## 2022-05-15 DIAGNOSIS — Z951 Presence of aortocoronary bypass graft: Secondary | ICD-10-CM | POA: Diagnosis not present

## 2022-05-16 DIAGNOSIS — Z992 Dependence on renal dialysis: Secondary | ICD-10-CM | POA: Diagnosis not present

## 2022-05-16 DIAGNOSIS — N186 End stage renal disease: Secondary | ICD-10-CM | POA: Diagnosis not present

## 2022-05-16 DIAGNOSIS — D631 Anemia in chronic kidney disease: Secondary | ICD-10-CM | POA: Diagnosis not present

## 2022-05-17 DIAGNOSIS — E1165 Type 2 diabetes mellitus with hyperglycemia: Secondary | ICD-10-CM | POA: Diagnosis not present

## 2022-05-17 DIAGNOSIS — E11649 Type 2 diabetes mellitus with hypoglycemia without coma: Secondary | ICD-10-CM | POA: Diagnosis not present

## 2022-05-17 DIAGNOSIS — Z951 Presence of aortocoronary bypass graft: Secondary | ICD-10-CM | POA: Diagnosis not present

## 2022-05-18 DIAGNOSIS — D631 Anemia in chronic kidney disease: Secondary | ICD-10-CM | POA: Diagnosis not present

## 2022-05-18 DIAGNOSIS — N186 End stage renal disease: Secondary | ICD-10-CM | POA: Diagnosis not present

## 2022-05-19 DIAGNOSIS — Z951 Presence of aortocoronary bypass graft: Secondary | ICD-10-CM | POA: Diagnosis not present

## 2022-05-19 DIAGNOSIS — E119 Type 2 diabetes mellitus without complications: Secondary | ICD-10-CM | POA: Diagnosis not present

## 2022-05-19 DIAGNOSIS — Z992 Dependence on renal dialysis: Secondary | ICD-10-CM | POA: Diagnosis not present

## 2022-05-19 DIAGNOSIS — N186 End stage renal disease: Secondary | ICD-10-CM | POA: Diagnosis not present

## 2022-05-20 DIAGNOSIS — D631 Anemia in chronic kidney disease: Secondary | ICD-10-CM | POA: Diagnosis not present

## 2022-05-20 DIAGNOSIS — J90 Pleural effusion, not elsewhere classified: Secondary | ICD-10-CM | POA: Diagnosis not present

## 2022-05-20 DIAGNOSIS — N186 End stage renal disease: Secondary | ICD-10-CM | POA: Diagnosis not present

## 2022-05-20 DIAGNOSIS — Z951 Presence of aortocoronary bypass graft: Secondary | ICD-10-CM | POA: Diagnosis not present

## 2022-05-20 DIAGNOSIS — J9811 Atelectasis: Secondary | ICD-10-CM | POA: Diagnosis not present

## 2022-05-21 DIAGNOSIS — Z992 Dependence on renal dialysis: Secondary | ICD-10-CM | POA: Diagnosis not present

## 2022-05-21 DIAGNOSIS — N186 End stage renal disease: Secondary | ICD-10-CM | POA: Diagnosis not present

## 2022-05-22 DIAGNOSIS — N186 End stage renal disease: Secondary | ICD-10-CM | POA: Diagnosis not present

## 2022-05-23 DIAGNOSIS — Z992 Dependence on renal dialysis: Secondary | ICD-10-CM | POA: Diagnosis not present

## 2022-05-23 DIAGNOSIS — N186 End stage renal disease: Secondary | ICD-10-CM | POA: Diagnosis not present

## 2022-05-25 DIAGNOSIS — R404 Transient alteration of awareness: Secondary | ICD-10-CM | POA: Diagnosis not present

## 2022-05-25 DIAGNOSIS — E161 Other hypoglycemia: Secondary | ICD-10-CM | POA: Diagnosis not present

## 2022-05-25 DIAGNOSIS — I959 Hypotension, unspecified: Secondary | ICD-10-CM | POA: Diagnosis not present

## 2022-05-25 DIAGNOSIS — E162 Hypoglycemia, unspecified: Secondary | ICD-10-CM | POA: Diagnosis not present

## 2022-05-25 DIAGNOSIS — R0902 Hypoxemia: Secondary | ICD-10-CM | POA: Diagnosis not present

## 2022-05-26 DIAGNOSIS — N186 End stage renal disease: Secondary | ICD-10-CM | POA: Diagnosis not present

## 2022-05-26 DIAGNOSIS — Z992 Dependence on renal dialysis: Secondary | ICD-10-CM | POA: Diagnosis not present

## 2022-05-26 DIAGNOSIS — N2581 Secondary hyperparathyroidism of renal origin: Secondary | ICD-10-CM | POA: Diagnosis not present

## 2022-05-27 DIAGNOSIS — Z951 Presence of aortocoronary bypass graft: Secondary | ICD-10-CM | POA: Diagnosis not present

## 2022-05-27 DIAGNOSIS — Z48812 Encounter for surgical aftercare following surgery on the circulatory system: Secondary | ICD-10-CM | POA: Diagnosis not present

## 2022-05-27 DIAGNOSIS — R001 Bradycardia, unspecified: Secondary | ICD-10-CM | POA: Diagnosis not present

## 2022-05-27 DIAGNOSIS — R918 Other nonspecific abnormal finding of lung field: Secondary | ICD-10-CM | POA: Diagnosis not present

## 2022-05-27 DIAGNOSIS — R9431 Abnormal electrocardiogram [ECG] [EKG]: Secondary | ICD-10-CM | POA: Diagnosis not present

## 2022-05-28 DIAGNOSIS — N2581 Secondary hyperparathyroidism of renal origin: Secondary | ICD-10-CM | POA: Diagnosis not present

## 2022-05-28 DIAGNOSIS — Z992 Dependence on renal dialysis: Secondary | ICD-10-CM | POA: Diagnosis not present

## 2022-05-28 DIAGNOSIS — N186 End stage renal disease: Secondary | ICD-10-CM | POA: Diagnosis not present

## 2022-05-30 DIAGNOSIS — Z992 Dependence on renal dialysis: Secondary | ICD-10-CM | POA: Diagnosis not present

## 2022-05-30 DIAGNOSIS — N2581 Secondary hyperparathyroidism of renal origin: Secondary | ICD-10-CM | POA: Diagnosis not present

## 2022-05-30 DIAGNOSIS — N186 End stage renal disease: Secondary | ICD-10-CM | POA: Diagnosis not present

## 2022-06-01 ENCOUNTER — Telehealth: Payer: Self-pay

## 2022-06-01 NOTE — Telephone Encounter (Signed)
Advised of verbal order.

## 2022-06-01 NOTE — Telephone Encounter (Signed)
Ok

## 2022-06-01 NOTE — Telephone Encounter (Signed)
Shireen is calling from [facility], needs verbal orders to move Evaluation of OT to this week as patient was in hospital last week.

## 2022-06-02 DIAGNOSIS — N2581 Secondary hyperparathyroidism of renal origin: Secondary | ICD-10-CM | POA: Diagnosis not present

## 2022-06-02 DIAGNOSIS — N186 End stage renal disease: Secondary | ICD-10-CM | POA: Diagnosis not present

## 2022-06-02 DIAGNOSIS — Z992 Dependence on renal dialysis: Secondary | ICD-10-CM | POA: Diagnosis not present

## 2022-06-03 ENCOUNTER — Ambulatory Visit: Payer: Self-pay

## 2022-06-03 NOTE — Patient Outreach (Signed)
  Care Coordination   Follow Up Visit Note   06/03/2022 Name: Joseph Mcpherson MRN: [id number] DOB: [birth year]  Joseph Mcpherson is a 77 y.o. year old male who sees Joseph Athens, MD for primary care. I  spoke with patients wife, Joseph Mcpherson  What matters to the patients health and wellness today?  " Continue to get well"    Goals Addressed             This Visit's Progress    Care coordination activities/ Post hospital follow up / management       Care Coordination Intervention Evaluation of current treatment plan related to recent CABG  and patient's adherence to plan as established by provider:  Wife states patient's had follow up with surgeon and removal of stiches. She states patients wound is healing well. Wife states patient is scheduled for return visit with surgeon on 06/12/22.  Wife states patient is doing" pretty good."  She denies patient complianing of chest pain. She states she manages his medications.  Wife reports patient receiving home health services.  Reviewed medications with spouse and discussed importance of compliance Reviewed scheduled/upcoming provider appointments I Advised to notify provider / surgeon for mild to moderate symptoms.  Call 911 for severe symptoms.  Explained and offered ongoing follow up with RNCM for care coordination          SDOH assessments and interventions completed:  No     Care Coordination Interventions:  Yes, provided   Follow up plan: Follow up call scheduled for 07/03/22    Encounter Outcome:  Pt. Visit Completed   Joseph Plowman RN,BSN,CCM [place] [phone] direct line

## 2022-06-04 DIAGNOSIS — N2581 Secondary hyperparathyroidism of renal origin: Secondary | ICD-10-CM | POA: Diagnosis not present

## 2022-06-04 DIAGNOSIS — N186 End stage renal disease: Secondary | ICD-10-CM | POA: Diagnosis not present

## 2022-06-04 DIAGNOSIS — Z992 Dependence on renal dialysis: Secondary | ICD-10-CM | POA: Diagnosis not present

## 2022-06-06 DIAGNOSIS — Z992 Dependence on renal dialysis: Secondary | ICD-10-CM | POA: Diagnosis not present

## 2022-06-06 DIAGNOSIS — N2581 Secondary hyperparathyroidism of renal origin: Secondary | ICD-10-CM | POA: Diagnosis not present

## 2022-06-06 DIAGNOSIS — N186 End stage renal disease: Secondary | ICD-10-CM | POA: Diagnosis not present

## 2022-06-09 DIAGNOSIS — N2581 Secondary hyperparathyroidism of renal origin: Secondary | ICD-10-CM | POA: Diagnosis not present

## 2022-06-09 DIAGNOSIS — N186 End stage renal disease: Secondary | ICD-10-CM | POA: Diagnosis not present

## 2022-06-09 DIAGNOSIS — Z992 Dependence on renal dialysis: Secondary | ICD-10-CM | POA: Diagnosis not present

## 2022-06-09 DIAGNOSIS — E119 Type 2 diabetes mellitus without complications: Secondary | ICD-10-CM | POA: Diagnosis not present

## 2022-06-09 DIAGNOSIS — Z794 Long term (current) use of insulin: Secondary | ICD-10-CM | POA: Diagnosis not present

## 2022-06-11 ENCOUNTER — Telehealth: Payer: Self-pay

## 2022-06-11 DIAGNOSIS — N2581 Secondary hyperparathyroidism of renal origin: Secondary | ICD-10-CM | POA: Diagnosis not present

## 2022-06-11 DIAGNOSIS — N186 End stage renal disease: Secondary | ICD-10-CM | POA: Diagnosis not present

## 2022-06-11 DIAGNOSIS — Z992 Dependence on renal dialysis: Secondary | ICD-10-CM | POA: Diagnosis not present

## 2022-06-11 NOTE — Telephone Encounter (Signed)
Call to wife who reports patient is with her. Wife *explained patient was in the hospital for heart bypass surgery recently and is on dialysis. Wife reports husband is too tired to come to [facility] to get tested. RN asked to speak to husband who declined TB testing. Unable to complete a Individual Contact Form while on the phone as patient gave phone back to wife. No further conversation. Servando Salina, RN

## 2022-06-12 DIAGNOSIS — I5020 Unspecified systolic (congestive) heart failure: Secondary | ICD-10-CM | POA: Diagnosis not present

## 2022-06-12 DIAGNOSIS — Z951 Presence of aortocoronary bypass graft: Secondary | ICD-10-CM | POA: Diagnosis not present

## 2022-06-12 DIAGNOSIS — E782 Mixed hyperlipidemia: Secondary | ICD-10-CM | POA: Diagnosis not present

## 2022-06-12 DIAGNOSIS — J9601 Acute respiratory failure with hypoxia: Secondary | ICD-10-CM | POA: Diagnosis not present

## 2022-06-12 DIAGNOSIS — I6381 Other cerebral infarction due to occlusion or stenosis of small artery: Secondary | ICD-10-CM | POA: Diagnosis not present

## 2022-06-12 DIAGNOSIS — N186 End stage renal disease: Secondary | ICD-10-CM | POA: Diagnosis not present

## 2022-06-12 DIAGNOSIS — I10 Essential (primary) hypertension: Secondary | ICD-10-CM | POA: Diagnosis not present

## 2022-06-12 DIAGNOSIS — Z9889 Other specified postprocedural states: Secondary | ICD-10-CM | POA: Diagnosis not present

## 2022-06-12 DIAGNOSIS — E119 Type 2 diabetes mellitus without complications: Secondary | ICD-10-CM | POA: Diagnosis not present

## 2022-06-13 DIAGNOSIS — N186 End stage renal disease: Secondary | ICD-10-CM | POA: Diagnosis not present

## 2022-06-13 DIAGNOSIS — N2581 Secondary hyperparathyroidism of renal origin: Secondary | ICD-10-CM | POA: Diagnosis not present

## 2022-06-13 DIAGNOSIS — Z992 Dependence on renal dialysis: Secondary | ICD-10-CM | POA: Diagnosis not present

## 2022-06-16 DIAGNOSIS — N186 End stage renal disease: Secondary | ICD-10-CM | POA: Diagnosis not present

## 2022-06-16 DIAGNOSIS — N2581 Secondary hyperparathyroidism of renal origin: Secondary | ICD-10-CM | POA: Diagnosis not present

## 2022-06-16 DIAGNOSIS — Z992 Dependence on renal dialysis: Secondary | ICD-10-CM | POA: Diagnosis not present

## 2022-06-18 DIAGNOSIS — N2581 Secondary hyperparathyroidism of renal origin: Secondary | ICD-10-CM | POA: Diagnosis not present

## 2022-06-18 DIAGNOSIS — N186 End stage renal disease: Secondary | ICD-10-CM | POA: Diagnosis not present

## 2022-06-18 DIAGNOSIS — Z992 Dependence on renal dialysis: Secondary | ICD-10-CM | POA: Diagnosis not present

## 2022-06-20 DIAGNOSIS — N2581 Secondary hyperparathyroidism of renal origin: Secondary | ICD-10-CM | POA: Diagnosis not present

## 2022-06-20 DIAGNOSIS — N186 End stage renal disease: Secondary | ICD-10-CM | POA: Diagnosis not present

## 2022-06-20 DIAGNOSIS — Z992 Dependence on renal dialysis: Secondary | ICD-10-CM | POA: Diagnosis not present

## 2022-06-23 DIAGNOSIS — Z992 Dependence on renal dialysis: Secondary | ICD-10-CM | POA: Diagnosis not present

## 2022-06-23 DIAGNOSIS — N2581 Secondary hyperparathyroidism of renal origin: Secondary | ICD-10-CM | POA: Diagnosis not present

## 2022-06-23 DIAGNOSIS — N186 End stage renal disease: Secondary | ICD-10-CM | POA: Diagnosis not present

## 2022-06-24 DIAGNOSIS — N186 End stage renal disease: Secondary | ICD-10-CM | POA: Diagnosis not present

## 2022-06-24 DIAGNOSIS — E1122 Type 2 diabetes mellitus with diabetic chronic kidney disease: Secondary | ICD-10-CM | POA: Diagnosis not present

## 2022-06-24 DIAGNOSIS — Z992 Dependence on renal dialysis: Secondary | ICD-10-CM | POA: Diagnosis not present

## 2022-06-25 DIAGNOSIS — N186 End stage renal disease: Secondary | ICD-10-CM | POA: Diagnosis not present

## 2022-06-25 DIAGNOSIS — N2581 Secondary hyperparathyroidism of renal origin: Secondary | ICD-10-CM | POA: Diagnosis not present

## 2022-06-25 DIAGNOSIS — Z992 Dependence on renal dialysis: Secondary | ICD-10-CM | POA: Diagnosis not present

## 2022-06-27 DIAGNOSIS — Z992 Dependence on renal dialysis: Secondary | ICD-10-CM | POA: Diagnosis not present

## 2022-06-27 DIAGNOSIS — N2581 Secondary hyperparathyroidism of renal origin: Secondary | ICD-10-CM | POA: Diagnosis not present

## 2022-06-27 DIAGNOSIS — N186 End stage renal disease: Secondary | ICD-10-CM | POA: Diagnosis not present

## 2022-06-29 ENCOUNTER — Other Ambulatory Visit: Payer: Self-pay | Admitting: Internal Medicine

## 2022-06-29 DIAGNOSIS — K2100 Gastro-esophageal reflux disease with esophagitis, without bleeding: Secondary | ICD-10-CM

## 2022-06-30 DIAGNOSIS — Z992 Dependence on renal dialysis: Secondary | ICD-10-CM | POA: Diagnosis not present

## 2022-06-30 DIAGNOSIS — N186 End stage renal disease: Secondary | ICD-10-CM | POA: Diagnosis not present

## 2022-06-30 DIAGNOSIS — N2581 Secondary hyperparathyroidism of renal origin: Secondary | ICD-10-CM | POA: Diagnosis not present

## 2022-07-02 DIAGNOSIS — Z992 Dependence on renal dialysis: Secondary | ICD-10-CM | POA: Diagnosis not present

## 2022-07-02 DIAGNOSIS — N2581 Secondary hyperparathyroidism of renal origin: Secondary | ICD-10-CM | POA: Diagnosis not present

## 2022-07-02 DIAGNOSIS — N186 End stage renal disease: Secondary | ICD-10-CM | POA: Diagnosis not present

## 2022-07-03 ENCOUNTER — Ambulatory Visit: Payer: Self-pay

## 2022-07-03 NOTE — Patient Outreach (Signed)
  Care Coordination   Follow Up Visit Note   07/03/2022 Name: Joseph Mcpherson MRN: [id number] DOB: Oct 01, 1945  Joseph Mcpherson is a 77 y.o. year old male who sees [name], MD for primary care. I  spoke with wife, Joseph Mcpherson  What matters to the patients health and wellness today?  Managing health conditions    Goals Addressed             This Visit's Progress    Care coordination activities/ Post hospital follow up / management       Interventions Today    Flowsheet Row Most Recent Value  Chronic Disease Discussed/Reviewed   Chronic disease discussed/reviewed during today's visit Diabetes, Other  [CAD]  General Interventions   General Interventions Discussed/Reviewed General Interventions Reviewed, Doctor Visits  Doctor Visits Discussed/Reviewed Doctor Visits Reviewed, PCP  PCP/Specialist Visits --  [advised to call insurance provider to request list of participating providers.]       Care Coordination Intervention Evaluation of current treatment plan related to recent CABG / diabetes and patient's adherence to plan as established by provider:  Wife denies patient complaints of chest pain, palpitations, or shortness of breath.  Wife states patient continues to receive home health services. States patient had recent follow up with endocrinologist and cardiology. Denies any medication changes. Wife states her son is working on getting patient a new primary care provider. Per chart review patients current A1c is 6.2 Advised wife and/ or son to call patients insurance provider for list  of participating primary providers.  Reviewed medications with spouse and discussed importance of compliance Reviewed scheduled/upcoming provider appointments.  Encouraged to call and schedule next endocrinology appointment. Explained and offered ongoing follow up with [facility] for care coordination: Wife agreed to next telephone outreach with [facility] for 09/16/22.           SDOH assessments and  interventions completed:  No     Care Coordination Interventions:  Yes, provided   Follow up plan: Follow up call scheduled for 09/16/22    Encounter Outcome:  Pt. Visit Completed   Quinn Plowman RN,BSN,CCM [place] [phone] direct line

## 2022-07-04 DIAGNOSIS — N2581 Secondary hyperparathyroidism of renal origin: Secondary | ICD-10-CM | POA: Diagnosis not present

## 2022-07-04 DIAGNOSIS — Z992 Dependence on renal dialysis: Secondary | ICD-10-CM | POA: Diagnosis not present

## 2022-07-04 DIAGNOSIS — N186 End stage renal disease: Secondary | ICD-10-CM | POA: Diagnosis not present

## 2022-07-07 DIAGNOSIS — N186 End stage renal disease: Secondary | ICD-10-CM | POA: Diagnosis not present

## 2022-07-07 DIAGNOSIS — Z992 Dependence on renal dialysis: Secondary | ICD-10-CM | POA: Diagnosis not present

## 2022-07-07 DIAGNOSIS — N2581 Secondary hyperparathyroidism of renal origin: Secondary | ICD-10-CM | POA: Diagnosis not present

## 2022-07-09 DIAGNOSIS — N186 End stage renal disease: Secondary | ICD-10-CM | POA: Diagnosis not present

## 2022-07-09 DIAGNOSIS — Z992 Dependence on renal dialysis: Secondary | ICD-10-CM | POA: Diagnosis not present

## 2022-07-09 DIAGNOSIS — N2581 Secondary hyperparathyroidism of renal origin: Secondary | ICD-10-CM | POA: Diagnosis not present

## 2022-07-11 DIAGNOSIS — I5022 Chronic systolic (congestive) heart failure: Secondary | ICD-10-CM | POA: Diagnosis not present

## 2022-07-11 DIAGNOSIS — I6523 Occlusion and stenosis of bilateral carotid arteries: Secondary | ICD-10-CM | POA: Diagnosis not present

## 2022-07-11 DIAGNOSIS — J9811 Atelectasis: Secondary | ICD-10-CM | POA: Diagnosis not present

## 2022-07-11 DIAGNOSIS — I252 Old myocardial infarction: Secondary | ICD-10-CM | POA: Diagnosis not present

## 2022-07-11 DIAGNOSIS — Z992 Dependence on renal dialysis: Secondary | ICD-10-CM | POA: Diagnosis not present

## 2022-07-11 DIAGNOSIS — I4891 Unspecified atrial fibrillation: Secondary | ICD-10-CM | POA: Diagnosis not present

## 2022-07-11 DIAGNOSIS — I132 Hypertensive heart and chronic kidney disease with heart failure and with stage 5 chronic kidney disease, or end stage renal disease: Secondary | ICD-10-CM | POA: Diagnosis not present

## 2022-07-11 DIAGNOSIS — R4701 Aphasia: Secondary | ICD-10-CM | POA: Diagnosis not present

## 2022-07-11 DIAGNOSIS — I2510 Atherosclerotic heart disease of native coronary artery without angina pectoris: Secondary | ICD-10-CM | POA: Diagnosis not present

## 2022-07-11 DIAGNOSIS — N186 End stage renal disease: Secondary | ICD-10-CM | POA: Diagnosis not present

## 2022-07-11 DIAGNOSIS — E1122 Type 2 diabetes mellitus with diabetic chronic kidney disease: Secondary | ICD-10-CM | POA: Diagnosis not present

## 2022-07-11 DIAGNOSIS — N2581 Secondary hyperparathyroidism of renal origin: Secondary | ICD-10-CM | POA: Diagnosis not present

## 2022-07-11 DIAGNOSIS — R29818 Other symptoms and signs involving the nervous system: Secondary | ICD-10-CM | POA: Diagnosis not present

## 2022-07-11 DIAGNOSIS — R531 Weakness: Secondary | ICD-10-CM | POA: Diagnosis not present

## 2022-07-11 DIAGNOSIS — I6389 Other cerebral infarction: Secondary | ICD-10-CM | POA: Diagnosis not present

## 2022-07-12 DIAGNOSIS — I69319 Unspecified symptoms and signs involving cognitive functions following cerebral infarction: Secondary | ICD-10-CM | POA: Diagnosis not present

## 2022-07-14 DIAGNOSIS — N2581 Secondary hyperparathyroidism of renal origin: Secondary | ICD-10-CM | POA: Diagnosis not present

## 2022-07-14 DIAGNOSIS — Z992 Dependence on renal dialysis: Secondary | ICD-10-CM | POA: Diagnosis not present

## 2022-07-14 DIAGNOSIS — N186 End stage renal disease: Secondary | ICD-10-CM | POA: Diagnosis not present

## 2022-07-16 DIAGNOSIS — N186 End stage renal disease: Secondary | ICD-10-CM | POA: Diagnosis not present

## 2022-07-16 DIAGNOSIS — N2581 Secondary hyperparathyroidism of renal origin: Secondary | ICD-10-CM | POA: Diagnosis not present

## 2022-07-16 DIAGNOSIS — Z992 Dependence on renal dialysis: Secondary | ICD-10-CM | POA: Diagnosis not present

## 2022-07-18 DIAGNOSIS — Z992 Dependence on renal dialysis: Secondary | ICD-10-CM | POA: Diagnosis not present

## 2022-07-18 DIAGNOSIS — N186 End stage renal disease: Secondary | ICD-10-CM | POA: Diagnosis not present

## 2022-07-18 DIAGNOSIS — N2581 Secondary hyperparathyroidism of renal origin: Secondary | ICD-10-CM | POA: Diagnosis not present

## 2022-07-20 DIAGNOSIS — I25810 Atherosclerosis of coronary artery bypass graft(s) without angina pectoris: Secondary | ICD-10-CM | POA: Diagnosis not present

## 2022-07-20 DIAGNOSIS — G40909 Epilepsy, unspecified, not intractable, without status epilepticus: Secondary | ICD-10-CM | POA: Diagnosis not present

## 2022-07-20 DIAGNOSIS — Z8673 Personal history of transient ischemic attack (TIA), and cerebral infarction without residual deficits: Secondary | ICD-10-CM | POA: Diagnosis not present

## 2022-07-20 DIAGNOSIS — N186 End stage renal disease: Secondary | ICD-10-CM | POA: Diagnosis not present

## 2022-07-20 DIAGNOSIS — F17290 Nicotine dependence, other tobacco product, uncomplicated: Secondary | ICD-10-CM | POA: Diagnosis not present

## 2022-07-20 DIAGNOSIS — I442 Atrioventricular block, complete: Secondary | ICD-10-CM | POA: Diagnosis not present

## 2022-07-20 DIAGNOSIS — F0390 Unspecified dementia without behavioral disturbance: Secondary | ICD-10-CM | POA: Diagnosis not present

## 2022-07-20 DIAGNOSIS — I5030 Unspecified diastolic (congestive) heart failure: Secondary | ICD-10-CM | POA: Diagnosis not present

## 2022-07-20 DIAGNOSIS — E782 Mixed hyperlipidemia: Secondary | ICD-10-CM | POA: Diagnosis not present

## 2022-07-20 DIAGNOSIS — E119 Type 2 diabetes mellitus without complications: Secondary | ICD-10-CM | POA: Diagnosis not present

## 2022-07-20 DIAGNOSIS — E1140 Type 2 diabetes mellitus with diabetic neuropathy, unspecified: Secondary | ICD-10-CM | POA: Diagnosis not present

## 2022-07-20 DIAGNOSIS — Z794 Long term (current) use of insulin: Secondary | ICD-10-CM | POA: Diagnosis not present

## 2022-07-20 DIAGNOSIS — E1122 Type 2 diabetes mellitus with diabetic chronic kidney disease: Secondary | ICD-10-CM | POA: Diagnosis not present

## 2022-07-20 DIAGNOSIS — I4891 Unspecified atrial fibrillation: Secondary | ICD-10-CM | POA: Diagnosis not present

## 2022-07-20 DIAGNOSIS — I132 Hypertensive heart and chronic kidney disease with heart failure and with stage 5 chronic kidney disease, or end stage renal disease: Secondary | ICD-10-CM | POA: Diagnosis not present

## 2022-07-20 DIAGNOSIS — I5020 Unspecified systolic (congestive) heart failure: Secondary | ICD-10-CM | POA: Diagnosis not present

## 2022-07-20 DIAGNOSIS — Z951 Presence of aortocoronary bypass graft: Secondary | ICD-10-CM | POA: Diagnosis not present

## 2022-07-20 DIAGNOSIS — K219 Gastro-esophageal reflux disease without esophagitis: Secondary | ICD-10-CM | POA: Diagnosis not present

## 2022-07-20 DIAGNOSIS — I110 Hypertensive heart disease with heart failure: Secondary | ICD-10-CM | POA: Diagnosis not present

## 2022-07-20 DIAGNOSIS — Z95818 Presence of other cardiac implants and grafts: Secondary | ICD-10-CM | POA: Diagnosis not present

## 2022-07-21 DIAGNOSIS — Z95818 Presence of other cardiac implants and grafts: Secondary | ICD-10-CM | POA: Diagnosis not present

## 2022-07-21 DIAGNOSIS — N186 End stage renal disease: Secondary | ICD-10-CM | POA: Diagnosis not present

## 2022-07-21 DIAGNOSIS — Z951 Presence of aortocoronary bypass graft: Secondary | ICD-10-CM | POA: Diagnosis not present

## 2022-07-21 DIAGNOSIS — F17290 Nicotine dependence, other tobacco product, uncomplicated: Secondary | ICD-10-CM | POA: Diagnosis not present

## 2022-07-21 DIAGNOSIS — Z992 Dependence on renal dialysis: Secondary | ICD-10-CM | POA: Diagnosis not present

## 2022-07-21 DIAGNOSIS — I4891 Unspecified atrial fibrillation: Secondary | ICD-10-CM | POA: Diagnosis not present

## 2022-07-21 DIAGNOSIS — I442 Atrioventricular block, complete: Secondary | ICD-10-CM | POA: Diagnosis not present

## 2022-07-21 DIAGNOSIS — N2581 Secondary hyperparathyroidism of renal origin: Secondary | ICD-10-CM | POA: Diagnosis not present

## 2022-07-23 DIAGNOSIS — E1122 Type 2 diabetes mellitus with diabetic chronic kidney disease: Secondary | ICD-10-CM | POA: Diagnosis not present

## 2022-07-23 DIAGNOSIS — N2581 Secondary hyperparathyroidism of renal origin: Secondary | ICD-10-CM | POA: Diagnosis not present

## 2022-07-23 DIAGNOSIS — N186 End stage renal disease: Secondary | ICD-10-CM | POA: Diagnosis not present

## 2022-07-23 DIAGNOSIS — Z992 Dependence on renal dialysis: Secondary | ICD-10-CM | POA: Diagnosis not present

## 2022-07-25 DIAGNOSIS — N186 End stage renal disease: Secondary | ICD-10-CM | POA: Diagnosis not present

## 2022-07-25 DIAGNOSIS — Z992 Dependence on renal dialysis: Secondary | ICD-10-CM | POA: Diagnosis not present

## 2022-07-25 DIAGNOSIS — N2581 Secondary hyperparathyroidism of renal origin: Secondary | ICD-10-CM | POA: Diagnosis not present

## 2022-07-27 ENCOUNTER — Encounter: Payer: Self-pay | Admitting: *Deleted

## 2022-07-27 ENCOUNTER — Encounter: Payer: Medicare HMO | Attending: Cardiology | Admitting: *Deleted

## 2022-07-27 DIAGNOSIS — Z951 Presence of aortocoronary bypass graft: Secondary | ICD-10-CM | POA: Insufficient documentation

## 2022-07-27 NOTE — Progress Notes (Signed)
Initial phone call completed. Diagnosis can be found in [facility] 12/5. EP orientation scheduled for 3/11.

## 2022-07-28 DIAGNOSIS — Z992 Dependence on renal dialysis: Secondary | ICD-10-CM | POA: Diagnosis not present

## 2022-07-28 DIAGNOSIS — N186 End stage renal disease: Secondary | ICD-10-CM | POA: Diagnosis not present

## 2022-07-28 DIAGNOSIS — N2581 Secondary hyperparathyroidism of renal origin: Secondary | ICD-10-CM | POA: Diagnosis not present

## 2022-07-29 ENCOUNTER — Ambulatory Visit: Payer: Medicare HMO

## 2022-07-29 DIAGNOSIS — Z794 Long term (current) use of insulin: Secondary | ICD-10-CM | POA: Diagnosis not present

## 2022-07-29 DIAGNOSIS — E119 Type 2 diabetes mellitus without complications: Secondary | ICD-10-CM | POA: Diagnosis not present

## 2022-07-30 DIAGNOSIS — N186 End stage renal disease: Secondary | ICD-10-CM | POA: Diagnosis not present

## 2022-07-30 DIAGNOSIS — Z992 Dependence on renal dialysis: Secondary | ICD-10-CM | POA: Diagnosis not present

## 2022-07-30 DIAGNOSIS — N2581 Secondary hyperparathyroidism of renal origin: Secondary | ICD-10-CM | POA: Diagnosis not present

## 2022-08-01 DIAGNOSIS — N2581 Secondary hyperparathyroidism of renal origin: Secondary | ICD-10-CM | POA: Diagnosis not present

## 2022-08-01 DIAGNOSIS — Z992 Dependence on renal dialysis: Secondary | ICD-10-CM | POA: Diagnosis not present

## 2022-08-01 DIAGNOSIS — N186 End stage renal disease: Secondary | ICD-10-CM | POA: Diagnosis not present

## 2022-08-03 ENCOUNTER — Encounter: Payer: Medicare HMO | Admitting: *Deleted

## 2022-08-03 VITALS — Ht 64.0 in | Wt 142.0 lb

## 2022-08-03 DIAGNOSIS — Z9889 Other specified postprocedural states: Secondary | ICD-10-CM | POA: Diagnosis not present

## 2022-08-03 DIAGNOSIS — E119 Type 2 diabetes mellitus without complications: Secondary | ICD-10-CM | POA: Diagnosis not present

## 2022-08-03 DIAGNOSIS — Z794 Long term (current) use of insulin: Secondary | ICD-10-CM | POA: Diagnosis not present

## 2022-08-03 DIAGNOSIS — Z951 Presence of aortocoronary bypass graft: Secondary | ICD-10-CM | POA: Diagnosis not present

## 2022-08-03 DIAGNOSIS — I5020 Unspecified systolic (congestive) heart failure: Secondary | ICD-10-CM | POA: Diagnosis not present

## 2022-08-03 DIAGNOSIS — I6381 Other cerebral infarction due to occlusion or stenosis of small artery: Secondary | ICD-10-CM | POA: Diagnosis not present

## 2022-08-03 DIAGNOSIS — E782 Mixed hyperlipidemia: Secondary | ICD-10-CM | POA: Diagnosis not present

## 2022-08-03 DIAGNOSIS — I10 Essential (primary) hypertension: Secondary | ICD-10-CM | POA: Diagnosis not present

## 2022-08-03 DIAGNOSIS — I480 Paroxysmal atrial fibrillation: Secondary | ICD-10-CM | POA: Diagnosis not present

## 2022-08-03 NOTE — Patient Instructions (Signed)
Patient Instructions  Patient Details  Name: Joseph Mcpherson MRN: [id number] Date of Birth: 01/02/1946 Referring Provider:  Isaias Cowman, MD  Below are your personal goals for exercise, nutrition, and risk factors. Our goal is to help you stay on track towards obtaining and maintaining these goals. We will be discussing your progress on these goals with you throughout the program.  Initial Exercise Prescription:  Initial Exercise Prescription - 08/03/22 1700       Date of Initial Exercise RX and Referring Provider   Date 08/03/22    Referring Provider Dr. Saralyn Pilar      Oxygen   Maintain Oxygen Saturation 88% or higher      Recumbant Bike   Level 1    RPM 50    Watts 10    Minutes 15      NuStep   Level 1    SPM 80    Minutes 15    METs 1.04      T5 Nustep   Level 1    SPM 80    Minutes 15    METs 1.04      Track   Laps 5    Minutes 15    METs 1.27      Prescription Details   Frequency (times per week) 3    Duration Progress to 30 minutes of continuous aerobic without signs/symptoms of physical distress      Intensity   THRR 40-80% of Max Heartrate 93-127    Ratings of Perceived Exertion 11-13    Perceived Dyspnea 0-4      Progression   Progression Continue to progress workloads to maintain intensity without signs/symptoms of physical distress.      Resistance Training   Training Prescription Yes    Weight 2    Reps 10-15             Exercise Goals: Frequency: Be able to perform aerobic exercise two to three times per week in program working toward 2-5 days per week of home exercise.  Intensity: Work with a perceived exertion of 11 (fairly light) - 15 (hard) while following your exercise prescription.  We will make changes to your prescription with you as you progress through the program.   Duration: Be able to do 30 to 45 minutes of continuous aerobic exercise in addition to a 5 minute warm-up and a 5 minute cool-down routine.   Nutrition  Goals: Your personal nutrition goals will be established when you do your nutrition analysis with the dietician.  The following are general nutrition guidelines to follow: Cholesterol < '200mg'$ /day Sodium < '1500mg'$ /day Fiber: Men over 50 yrs - 30 grams per day  Personal Goals:  Personal Goals and Risk Factors at Admission - 08/03/22 1710       Core Components/Risk Factors/Patient Goals on Admission    Weight Management Yes    Intervention Weight Management: Develop a combined nutrition and exercise program designed to reach desired caloric intake, while maintaining appropriate intake of nutrient and fiber, sodium and fats, and appropriate energy expenditure required for the weight goal.;Weight Management: Provide education and appropriate resources to help participant work on and attain dietary goals.;Weight Management/Obesity: Establish reasonable short term and long term weight goals.    Admit Weight 142 lb (64.4 kg)    Goal Weight: Short Term 140 lb (63.5 kg)    Goal Weight: Long Term 140 lb (63.5 kg)    Expected Outcomes Short Term: Continue to assess and modify interventions until short term  weight is achieved;Weight Maintenance: Understanding of the daily nutrition guidelines, which includes 25-35% calories from fat, 7% or less cal from saturated fats, less than '200mg'$  cholesterol, less than 1.5gm of sodium, & 5 or more servings of fruits and vegetables daily;Understanding recommendations for meals to include 15-35% energy as protein, 25-35% energy from fat, 35-60% energy from carbohydrates, less than '200mg'$  of dietary cholesterol, 20-35 gm of total fiber daily;Understanding of distribution of calorie intake throughout the day with the consumption of 4-5 meals/snacks;Long Term: Adherence to nutrition and physical activity/exercise program aimed toward attainment of established weight goal    Diabetes Yes    Intervention Provide education about signs/symptoms and action to take for  hypo/hyperglycemia.;Provide education about proper nutrition, including hydration, and aerobic/resistive exercise prescription along with prescribed medications to achieve blood glucose in normal ranges: Fasting glucose 65-99 mg/dL    Expected Outcomes Short Term: Participant verbalizes understanding of the signs/symptoms and immediate care of hyper/hypoglycemia, proper foot care and importance of medication, aerobic/resistive exercise and nutrition plan for blood glucose control.;Long Term: Attainment of HbA1C < 7%.    Heart Failure Yes    Intervention Provide a combined exercise and nutrition program that is supplemented with education, support and counseling about heart failure. Directed toward relieving symptoms such as shortness of breath, decreased exercise tolerance, and extremity edema.    Expected Outcomes Improve functional capacity of life;Short term: Attendance in program 2-3 days a week with increased exercise capacity. Reported lower sodium intake. Reported increased fruit and vegetable intake. Reports medication compliance.;Short term: Daily weights obtained and reported for increase. Utilizing diuretic protocols set by physician.;Long term: Adoption of self-care skills and reduction of barriers for early signs and symptoms recognition and intervention leading to self-care maintenance.    Hypertension Yes    Intervention Provide education on lifestyle modifcations including regular physical activity/exercise, weight management, moderate sodium restriction and increased consumption of fresh fruit, vegetables, and low fat dairy, alcohol moderation, and smoking cessation.;Monitor prescription use compliance.    Expected Outcomes Short Term: Continued assessment and intervention until BP is < 140/51m HG in hypertensive participants. < 130/836mHG in hypertensive participants with diabetes, heart failure or chronic kidney disease.;Long Term: Maintenance of blood pressure at goal levels.    Lipids  Yes    Intervention Provide education and support for participant on nutrition & aerobic/resistive exercise along with prescribed medications to achieve LDL '70mg'$ , HDL >'40mg'$ .    Expected Outcomes Short Term: Participant states understanding of desired cholesterol values and is compliant with medications prescribed. Participant is following exercise prescription and nutrition guidelines.;Long Term: Cholesterol controlled with medications as prescribed, with individualized exercise RX and with personalized nutrition plan. Value goals: LDL < '70mg'$ , HDL > 40 mg.             Tobacco Use Initial Evaluation: Social History   Tobacco Use  Smoking Status Never  Smokeless Tobacco Former    Exercise Goals and Review:  Exercise Goals     Row Name 08/03/22 1704             Exercise Goals   Increase Physical Activity Yes       Intervention Provide advice, education, support and counseling about physical activity/exercise needs.;Develop an individualized exercise prescription for aerobic and resistive training based on initial evaluation findings, risk stratification, comorbidities and participant's personal goals.       Expected Outcomes Short Term: Attend rehab on a regular basis to increase amount of physical activity.;Long Term: Add in home exercise to  make exercise part of routine and to increase amount of physical activity.;Long Term: Exercising regularly at least 3-5 days a week.       Increase Strength and Stamina Yes       Intervention Provide advice, education, support and counseling about physical activity/exercise needs.;Develop an individualized exercise prescription for aerobic and resistive training based on initial evaluation findings, risk stratification, comorbidities and participant's personal goals.       Expected Outcomes Short Term: Increase workloads from initial exercise prescription for resistance, speed, and METs.;Short Term: Perform resistance training exercises routinely  during rehab and add in resistance training at home;Long Term: Improve cardiorespiratory fitness, muscular endurance and strength as measured by increased METs and functional capacity (6MWT)       Able to understand and use rate of perceived exertion (RPE) scale Yes       Intervention Provide education and explanation on how to use RPE scale       Expected Outcomes Short Term: Able to use RPE daily in rehab to express subjective intensity level;Long Term:  Able to use RPE to guide intensity level when exercising independently       Able to understand and use Dyspnea scale Yes       Intervention Provide education and explanation on how to use Dyspnea scale       Expected Outcomes Short Term: Able to use Dyspnea scale daily in rehab to express subjective sense of shortness of breath during exertion;Long Term: Able to use Dyspnea scale to guide intensity level when exercising independently       Knowledge and understanding of Target Heart Rate Range (THRR) Yes       Intervention Provide education and explanation of THRR including how the numbers were predicted and where they are located for reference       Expected Outcomes Short Term: Able to state/look up THRR;Long Term: Able to use THRR to govern intensity when exercising independently;Short Term: Able to use daily as guideline for intensity in rehab       Able to check pulse independently Yes       Intervention Provide education and demonstration on how to check pulse in carotid and radial arteries.;Review the importance of being able to check your own pulse for safety during independent exercise       Expected Outcomes Short Term: Able to explain why pulse checking is important during independent exercise       Understanding of Exercise Prescription Yes       Intervention Provide education, explanation, and written materials on patient's individual exercise prescription       Expected Outcomes Short Term: Able to explain program exercise  prescription;Long Term: Able to explain home exercise prescription to exercise independently                Copy of goals given to participant.

## 2022-08-03 NOTE — Progress Notes (Signed)
Cardiac Individual Treatment Plan  Patient Details  Name: Joseph Mcpherson MRN: [id number] Date of Birth: 11/29/1945 Referring Provider:   Flowsheet Row Cardiac Rehab from 08/03/2022 in [facility] Cardiac and Pulmonary Rehab  Referring Provider Dr. Saralyn Pilar       Initial Encounter Date:  Flowsheet Row Cardiac Rehab from 08/03/2022 in [facility] Cardiac and Pulmonary Rehab  Date 08/03/22       Visit Diagnosis: S/P CABG x 4  Patient's Home Medications on Admission:  Current Outpatient Medications:    albuterol (PROVENTIL) (2.5 MG/3ML) 0.083% nebulizer solution, Take 3 mLs (2.5 mg total) by nebulization every 6 (six) hours as needed for wheezing or shortness of breath., Disp: 150 mL, Rfl: 1   amLODipine (NORVASC) 10 MG tablet, Take 1 tablet (10 mg total) by mouth daily., Disp: 30 tablet, Rfl: 3   aspirin EC 81 MG tablet, Take 81 mg by mouth daily. Swallow whole., Disp: , Rfl:    atorvastatin (LIPITOR) 40 MG tablet, Take 1 tablet (40 mg total) by mouth daily., Disp: 90 tablet, Rfl: 1   B Complex-C-Folic Acid (RENA-VITE RX) 1 MG TABS, Take 1 tablet by mouth daily. , Disp: , Rfl:    BD PEN NEEDLE NANO 2ND GEN 32G X 4 MM MISC, USE AS DIRECTED, Disp: 300 each, Rfl: 1   calcium acetate (PHOSLO) 667 MG capsule, TAKE 2 CAPSULES BY MOUTH 3 TIMES A DAY WITH MEAL, Disp: 540 capsule, Rfl: 1   Cholecalciferol (VITAMIN D3) 5000 units TABS, Take 5,000 Units by mouth daily. , Disp: , Rfl:    clopidogrel (PLAVIX) 75 MG tablet, Take 1 tablet (75 mg total) by mouth daily. (Patient not taking: Reported on 07/27/2022), Disp: 90 tablet, Rfl: 1   Continuous Blood Gluc Sensor (FREESTYLE LIBRE 14 DAY SENSOR) MISC, APPLY EVERY 14 (FOURTEEN) DAYS., Disp: 90 each, Rfl: 1   donepezil (ARICEPT) 10 MG tablet, TAKE 1 TABLET BY MOUTH EVERY DAY, Disp: 90 tablet, Rfl: 3   hydrALAZINE (APRESOLINE) 25 MG tablet, Take 25 mg by mouth 2 (two) times daily. (Patient not taking: Reported on 06/03/2022), Disp: , Rfl:    insulin detemir (LEVEMIR) 100  UNIT/ML injection, Inject 0.15 mLs (15 Units total) into the skin at bedtime. (Patient taking differently: Inject 14 Units into the skin at bedtime.), Disp: 10 mL, Rfl: 11   ipratropium-albuterol (DUONEB) 0.5-2.5 (3) MG/3ML SOLN, Take 3 mLs by nebulization every 6 (six) hours as needed., Disp: 120 mL, Rfl: 4   levETIRAcetam (KEPPRA) 250 MG tablet, Take 1 tablet (250 mg total) by mouth 3 (three) times a week. On Tuesday ,Thursday and Saturday after Hemodialysis (Patient not taking: Reported on 07/27/2022), Disp: 30 tablet, Rfl: 0   levETIRAcetam (KEPPRA) 500 MG tablet, Take 1 tablet (500 mg total) by mouth daily. (Patient not taking: Reported on 07/27/2022), Disp: 30 tablet, Rfl: 1   levothyroxine (SYNTHROID) 137 MCG tablet, TAKE 1 TABLET BY MOUTH EVERY DAY, Disp: 90 tablet, Rfl: 3   losartan (COZAAR) 100 MG tablet, Take 1 tablet (100 mg total) by mouth every evening., Disp: 30 tablet, Rfl: 3   metoprolol succinate (TOPROL-XL) 100 MG 24 hr tablet, Take 1 tablet (100 mg total) by mouth daily. Take with or immediately following a meal. (Patient not taking: Reported on 07/27/2022), Disp: , Rfl:    multivitamin (RENA-VIT) TABS tablet, Take 1 tablet by mouth daily., Disp: , Rfl: 0   pantoprazole (PROTONIX) 40 MG tablet, TAKE 1 TABLET BY MOUTH EVERY DAY, Disp: 90 tablet, Rfl: 3   sevelamer  carbonate (RENVELA) 2.4 g PACK, Take 2.4 g by mouth daily., Disp: 90 each, Rfl: 3   sitaGLIPtin (JANUVIA) 50 MG tablet, Take 50 mg by mouth every evening.  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 II   Diabetic eye exam ([place]) 12/2019   ESRD (end stage renal disease) ([place])    MWF- [facility]   GERD (gastroesophageal reflux disease)    [name] (hard of hearing)    Hyperlipidemia    Hypertension    Hypothyroidism     Thyroid disease     Tobacco Use: Social History   Tobacco Use  Smoking Status Never  Smokeless Tobacco Former    Labs: Review Flowsheet  More data exists      Latest Ref Rng & Units 02/12/2021 01/18/2022 04/11/2022 04/13/2022 04/15/2022  Labs for ITP Cardiac and Pulmonary Rehab  Cholestrol 0 - 200 mg/dL - - - - 120   LDL (calc) 0 - 99 mg/dL - - - - 66   HDL-C >40 mg/dL - - - - 42   Trlycerides <150 mg/dL - - - - 60   Hemoglobin A1c 4.8 - 5.6 % 7.0  8.1  5.9  - -  Bicarbonate 20.0 - 28.0 mmol/L - - - 28.5  -  O2 Saturation % - - - 92.2  -     Exercise Target Goals: Exercise Program Goal: Individual exercise prescription set using results from initial 6 min walk test and THRR while considering  patient's activity barriers and safety.   Exercise Prescription Goal: Initial exercise prescription builds to 30-45 minutes a day of aerobic activity, 2-3 days per week.  Home exercise guidelines will be given to patient during program as part of exercise prescription that the participant will acknowledge.   Education: Aerobic Exercise: - Group verbal and visual presentation on the components of exercise prescription. Introduces F.I.T.T principle from ACSM for exercise prescriptions.  Reviews F.I.T.T. principles of aerobic exercise including progression. Written material given at graduation.   Education: Resistance Exercise: - Group verbal and visual presentation on the components of exercise prescription. Introduces F.I.T.T principle from ACSM for exercise prescriptions  Reviews F.I.T.T. principles of resistance exercise including progression. Written material given at graduation.    Education: Exercise & Equipment Safety: - Individual verbal instruction and demonstration of equipment use and safety with use of the equipment. Flowsheet Row Cardiac Rehab from 08/03/2022 in [facility] Cardiac and Pulmonary Rehab  Date 08/03/22  Educator [facility]  Instruction Review Code 1- Verbalizes  Understanding       Education: Exercise Physiology & General Exercise Guidelines: - Group verbal and written instruction with models to review the exercise physiology of the cardiovascular system and associated critical values. Provides general exercise guidelines with specific guidelines to those with heart or lung disease.    Education: Flexibility, Balance, Mind/Body Relaxation: - Group verbal and visual presentation with interactive activity on the components of exercise prescription. Introduces F.I.T.T principle from ACSM for exercise prescriptions. Reviews F.I.T.T. principles of flexibility and balance exercise training including progression. Also discusses the mind body connection.  Reviews various relaxation techniques to help reduce and manage stress (i.e. Deep breathing, progressive muscle relaxation, and visualization). Balance handout provided to take home. Written material given at graduation.   Activity Barriers & Risk Stratification:  Activity Barriers & Cardiac Risk Stratification - 08/03/22 1700       Activity Barriers & Cardiac Risk Stratification   Activity Barriers None;Assistive Device    Cardiac Risk Stratification High             6 Minute Walk:  6 Minute Walk     Row Name 08/03/22 1657         6 Minute Walk   Phase Initial     Distance 480 feet     Walk Time 4 minutes     # of Rest Breaks 1  stopped test at 4 min mark due to being tired.     MPH 1.36     METS 1.04     RPE 13     Perceived Dyspnea  0     VO2 Peak 3.36     Symptoms No     Resting HR 60 bpm     Resting BP 110/70     Resting Oxygen Saturation  99 %     Exercise Oxygen Saturation  during 6 min walk 100 %     Max Ex. HR 80 bpm     Max Ex. BP 128/62     2 Minute Post BP 118/62              Oxygen Initial Assessment:   Oxygen Re-Evaluation:   Oxygen Discharge (Final Oxygen Re-Evaluation):   Initial Exercise Prescription:  Initial Exercise Prescription - 08/03/22  1700       Date of Initial Exercise RX and Referring Provider   Date 08/03/22    Referring Provider Dr. Saralyn Pilar      Oxygen   Maintain Oxygen Saturation 88% or higher      Recumbant Bike   Level 1    RPM 50    Watts 10    Minutes 15      NuStep   Level 1    SPM 80    Minutes 15    METs 1.04      T5 Nustep   Level 1    SPM 80    Minutes 15    METs 1.04      Track   Laps 5    Minutes 15    METs 1.27      Prescription Details   Frequency (times per week) 3    Duration Progress to 30 minutes of continuous aerobic without signs/symptoms of physical distress      Intensity   THRR 40-80% of Max Heartrate 93-127    Ratings of Perceived Exertion 11-13    Perceived Dyspnea 0-4      Progression   Progression Continue to progress workloads to maintain intensity without signs/symptoms of physical distress.      Resistance Training   Training Prescription Yes    Weight 2    Reps 10-15             Perform Capillary Blood Glucose checks as needed.  Exercise Prescription Changes:   Exercise Prescription Changes     Row Name 08/03/22 1700             Response to Exercise   Blood Pressure (Admit) 110/70       Blood Pressure (Exercise) 128/62       Blood Pressure (Exit) 118/62       Heart Rate (Admit) 60 bpm       Heart Rate (Exercise) 80 bpm       Heart Rate (Exit)  60 bpm       Oxygen Saturation (Admit) 99 %       Oxygen Saturation (Exercise) 100 %       Oxygen Saturation (Exit) 100 %       Rating of Perceived Exertion (Exercise) 13       Perceived Dyspnea (Exercise) 0       Symptoms none       Comments 6 MWT results                Exercise Comments:   Exercise Goals and Review:   Exercise Goals     Row Name 08/03/22 1704             Exercise Goals   Increase Physical Activity Yes       Intervention Provide advice, education, support and counseling about physical activity/exercise needs.;Develop an individualized exercise  prescription for aerobic and resistive training based on initial evaluation findings, risk stratification, comorbidities and participant's personal goals.       Expected Outcomes Short Term: Attend rehab on a regular basis to increase amount of physical activity.;Long Term: Add in home exercise to make exercise part of routine and to increase amount of physical activity.;Long Term: Exercising regularly at least 3-5 days a week.       Increase Strength and Stamina Yes       Intervention Provide advice, education, support and counseling about physical activity/exercise needs.;Develop an individualized exercise prescription for aerobic and resistive training based on initial evaluation findings, risk stratification, comorbidities and participant's personal goals.       Expected Outcomes Short Term: Increase workloads from initial exercise prescription for resistance, speed, and METs.;Short Term: Perform resistance training exercises routinely during rehab and add in resistance training at home;Long Term: Improve cardiorespiratory fitness, muscular endurance and strength as measured by increased METs and functional capacity (6MWT)       Able to understand and use rate of perceived exertion (RPE) scale Yes       Intervention Provide education and explanation on how to use RPE scale       Expected Outcomes Short Term: Able to use RPE daily in rehab to express subjective intensity level;Long Term:  Able to use RPE to guide intensity level when exercising independently       Able to understand and use Dyspnea scale Yes       Intervention Provide education and explanation on how to use Dyspnea scale       Expected Outcomes Short Term: Able to use Dyspnea scale daily in rehab to express subjective sense of shortness of breath during exertion;Long Term: Able to use Dyspnea scale to guide intensity level when exercising independently       Knowledge and understanding of Target Heart Rate Range (THRR) Yes        Intervention Provide education and explanation of THRR including how the numbers were predicted and where they are located for reference       Expected Outcomes Short Term: Able to state/look up THRR;Long Term: Able to use THRR to govern intensity when exercising independently;Short Term: Able to use daily as guideline for intensity in rehab       Able to check pulse independently Yes       Intervention Provide education and demonstration on how to check pulse in carotid and radial arteries.;Review the importance of being able to check your own pulse for safety during independent exercise       Expected Outcomes Short Term:  Able to explain why pulse checking is important during independent exercise       Understanding of Exercise Prescription Yes       Intervention Provide education, explanation, and written materials on patient's individual exercise prescription       Expected Outcomes Short Term: Able to explain program exercise prescription;Long Term: Able to explain home exercise prescription to exercise independently                Exercise Goals Re-Evaluation :   Discharge Exercise Prescription (Final Exercise Prescription Changes):  Exercise Prescription Changes - 08/03/22 1700       Response to Exercise   Blood Pressure (Admit) 110/70    Blood Pressure (Exercise) 128/62    Blood Pressure (Exit) 118/62    Heart Rate (Admit) 60 bpm    Heart Rate (Exercise) 80 bpm    Heart Rate (Exit) 60 bpm    Oxygen Saturation (Admit) 99 %    Oxygen Saturation (Exercise) 100 %    Oxygen Saturation (Exit) 100 %    Rating of Perceived Exertion (Exercise) 13    Perceived Dyspnea (Exercise) 0    Symptoms none    Comments 6 MWT results             Nutrition:  Target Goals: Understanding of nutrition guidelines, daily intake of sodium '1500mg'$ , cholesterol '200mg'$ , calories 30% from fat and 7% or less from saturated fats, daily to have 5 or more servings of fruits and  vegetables.  Education: All About Nutrition: -Group instruction provided by verbal, written material, interactive activities, discussions, models, and posters to present general guidelines for heart healthy nutrition including fat, fiber, MyPlate, the role of sodium in heart healthy nutrition, utilization of the nutrition label, and utilization of this knowledge for meal planning. Follow up email sent as well. Written material given at graduation.   Biometrics:  Pre Biometrics - 08/03/22 1705       Pre Biometrics   Waist Circumference --   deferred   Hip Circumference --   deferred             Nutrition Therapy Plan and Nutrition Goals:  Nutrition Therapy & Goals - 08/03/22 1712       Intervention Plan   Intervention Prescribe, educate and counsel regarding individualized specific dietary modifications aiming towards targeted core components such as weight, hypertension, lipid management, diabetes, heart failure and other comorbidities.    Expected Outcomes Short Term Goal: Understand basic principles of dietary content, such as calories, fat, sodium, cholesterol and nutrients.;Short Term Goal: A plan has been developed with personal nutrition goals set during dietitian appointment.;Long Term Goal: Adherence to prescribed nutrition plan.             Nutrition Assessments:  MEDIFICTS Score Key: ?70 Need to make dietary changes  40-70 Heart Healthy Diet ? 40 Therapeutic Level Cholesterol Diet   Picture Your Plate Scores: [id number] Unhealthy dietary pattern with much room for improvement. 41-50 Dietary pattern unlikely to meet recommendations for good health and room for improvement. 51-60 More healthful dietary pattern, with some room for improvement.  >60 Healthy dietary pattern, although there may be some specific behaviors that could be improved.    Nutrition Goals Re-Evaluation:   Nutrition Goals Discharge (Final Nutrition Goals  Re-Evaluation):   Psychosocial: Target Goals: Acknowledge presence or absence of significant depression and/or stress, maximize coping skills, provide positive support system. Participant is able to verbalize types and ability to use techniques and skills needed for  reducing stress and depression.   Education: Stress, Anxiety, and Depression - Group verbal and visual presentation to define topics covered.  Reviews how body is impacted by stress, anxiety, and depression.  Also discusses healthy ways to reduce stress and to treat/manage anxiety and depression.  Written material given at graduation.   Education: Sleep Hygiene -Provides group verbal and written instruction about how sleep can affect your health.  Define sleep hygiene, discuss sleep cycles and impact of sleep habits. Review good sleep hygiene tips.    Initial Review & Psychosocial Screening:  Initial Psych Review & Screening - 07/27/22 1553       Initial Review   Current issues with None Identified      Family Dynamics   Good Support System? Yes   Wife and Son            Quality of Life Scores:   Quality of Life - 08/03/22 1709       Quality of Life   Select --   deferred, patient unable to complete QOL questionaire.           Scores of 19 and below usually indicate a poorer quality of life in these areas.  A difference of  2-3 points is a clinically meaningful difference.  A difference of 2-3 points in the total score of the Quality of Life Index has been associated with significant improvement in overall quality of life, self-image, physical symptoms, and general health in studies assessing change in quality of life.  PHQ-9: Review Flowsheet       06/19/2021 03/12/2020  Depression screen PHQ 2/9  Decreased Interest 0 0  Down, Depressed, Hopeless 0 0  PHQ - 2 Score 0 0   Interpretation of Total Score  Total Score Depression Severity:  1-4 = Minimal depression, 5-9 = Mild depression, 10-14 = Moderate  depression, 15-19 = Moderately severe depression, 20-27 = Severe depression   Psychosocial Evaluation and Intervention:  Psychosocial Evaluation - 07/27/22 1605       Psychosocial Evaluation & Interventions   Interventions Encouraged to exercise with the program and follow exercise prescription;Relaxation education;Stress management education    Comments Josiahs is coming to cardiac rehab post CABG x 4. He has no barriers for attending the program. He lives with his wife and has good support from his son "Maurene Capes" also who was on the phone call today. Son states he would like his father to have encourangement to complete program to obtain a more active lifestyle after bypass surgery, to increase strength, move around more independantly and potentially to be able to drive again. Alexey does use a walker occasionally with ambulation. 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    Expected Outcomes Short: Attend cardiac rehab for education and exercise. Long:  Develop and maintain positive self care habits    Continue Psychosocial Services  Follow up required by staff             Psychosocial Re-Evaluation:   Psychosocial Discharge (Final Psychosocial Re-Evaluation):   Vocational Rehabilitation: Provide vocational rehab assistance to qualifying candidates.   Vocational Rehab Evaluation & Intervention:  Vocational Rehab - 07/27/22 1605       Initial Vocational Rehab Evaluation & Intervention   Assessment shows need for Vocational Rehabilitation No             Education: Education Goals: Education classes will be provided on a variety of topics geared toward  better understanding of heart health and risk factor modification. Participant will state understanding/return demonstration of topics presented as noted by education test scores.  Learning Barriers/Preferences:  Learning Barriers/Preferences - 07/27/22 1605        Learning Barriers/Preferences   Learning Barriers None    Learning Preferences None             General Cardiac Education Topics:  AED/CPR: - Group verbal and written instruction with the use of models to demonstrate the basic use of the AED with the basic ABC's of resuscitation.   Anatomy and Cardiac Procedures: - Group verbal and visual presentation and models provide information about basic cardiac anatomy and function. Reviews the testing methods done to diagnose heart disease and the outcomes of the test results. Describes the treatment choices: Medical Management, Angioplasty, or Coronary Bypass Surgery for treating various heart conditions including Myocardial Infarction, Angina, Valve Disease, and Cardiac Arrhythmias.  Written material given at graduation.   Medication Safety: - Group verbal and visual instruction to review commonly prescribed medications for heart and lung disease. Reviews the medication, class of the drug, and side effects. Includes the steps to properly store meds and maintain the prescription regimen.  Written material given at graduation.   Intimacy: - Group verbal instruction through game format to discuss how heart and lung disease can affect sexual intimacy. Written material given at graduation..   Know Your Numbers and Heart Failure: - Group verbal and visual instruction to discuss disease risk factors for cardiac and pulmonary disease and treatment options.  Reviews associated critical values for Overweight/Obesity, Hypertension, Cholesterol, and Diabetes.  Discusses basics of heart failure: signs/symptoms and treatments.  Introduces Heart Failure Zone chart for action plan for heart failure.  Written material given at graduation.   Infection Prevention: - Provides verbal and written material to individual with discussion of infection control including proper hand washing and proper equipment cleaning during exercise session. Flowsheet Row  Cardiac Rehab from 08/03/2022 in [facility] Cardiac and Pulmonary Rehab  Date 08/03/22  Educator [facility]  Instruction Review Code 1- Verbalizes Understanding       Falls Prevention: - Provides verbal and written material to individual with discussion of falls prevention and safety. Flowsheet Row Cardiac Rehab from 08/03/2022 in [facility] Cardiac and Pulmonary Rehab  Date 08/03/22  Educator [facility]  Instruction Review Code 1- Verbalizes Understanding       Other: -Provides group and verbal instruction on various topics (see comments)   Knowledge Questionnaire Score:  Knowledge Questionnaire Score - 08/03/22 1709       Knowledge Questionnaire Score   Pre Score deferred, Patient unable to complete knowledge questionaire.             Core Components/Risk Factors/Patient Goals at Admission:  Personal Goals and Risk Factors at Admission - 08/03/22 1710       Core Components/Risk Factors/Patient Goals on Admission    Weight Management Yes    Intervention Weight Management: Develop a combined nutrition and exercise program designed to reach desired caloric intake, while maintaining appropriate intake of nutrient and fiber, sodium and fats, and appropriate energy expenditure required for the weight goal.;Weight Management: Provide education and appropriate resources to help participant work on and attain dietary goals.;Weight Management/Obesity: Establish reasonable short term and long term weight goals.    Admit Weight 142 lb (64.4 kg)    Goal Weight: Short Term 140 lb (63.5 kg)    Goal Weight: Long Term 140 lb (63.5 kg)    Expected  Outcomes Short Term: Continue to assess and modify interventions until short term weight is achieved;Weight Maintenance: Understanding of the daily nutrition guidelines, which includes 25-35% calories from fat, 7% or less cal from saturated fats, less than '200mg'$  cholesterol, less than 1.5gm of sodium, & 5 or more servings of fruits and vegetables daily;Understanding  recommendations for meals to include 15-35% energy as protein, 25-35% energy from fat, 35-60% energy from carbohydrates, less than '200mg'$  of dietary cholesterol, 20-35 gm of total fiber daily;Understanding of distribution of calorie intake throughout the day with the consumption of 4-5 meals/snacks;Long Term: Adherence to nutrition and physical activity/exercise program aimed toward attainment of established weight goal    Diabetes Yes    Intervention Provide education about signs/symptoms and action to take for hypo/hyperglycemia.;Provide education about proper nutrition, including hydration, and aerobic/resistive exercise prescription along with prescribed medications to achieve blood glucose in normal ranges: Fasting glucose 65-99 mg/dL    Expected Outcomes Short Term: Participant verbalizes understanding of the signs/symptoms and immediate care of hyper/hypoglycemia, proper foot care and importance of medication, aerobic/resistive exercise and nutrition plan for blood glucose control.;Long Term: Attainment of HbA1C < 7%.    Heart Failure Yes    Intervention Provide a combined exercise and nutrition program that is supplemented with education, support and counseling about heart failure. Directed toward relieving symptoms such as shortness of breath, decreased exercise tolerance, and extremity edema.    Expected Outcomes Improve functional capacity of life;Short term: Attendance in program 2-3 days a week with increased exercise capacity. Reported lower sodium intake. Reported increased fruit and vegetable intake. Reports medication compliance.;Short term: Daily weights obtained and reported for increase. Utilizing diuretic protocols set by physician.;Long term: Adoption of self-care skills and reduction of barriers for early signs and symptoms recognition and intervention leading to self-care maintenance.    Hypertension Yes    Intervention Provide education on lifestyle modifcations including regular  physical activity/exercise, weight management, moderate sodium restriction and increased consumption of fresh fruit, vegetables, and low fat dairy, alcohol moderation, and smoking cessation.;Monitor prescription use compliance.    Expected Outcomes Short Term: Continued assessment and intervention until BP is < 140/81m HG in hypertensive participants. < 130/838mHG in hypertensive participants with diabetes, heart failure or chronic kidney disease.;Long Term: Maintenance of blood pressure at goal levels.    Lipids Yes    Intervention Provide education and support for participant on nutrition & aerobic/resistive exercise along with prescribed medications to achieve LDL '70mg'$ , HDL >'40mg'$ .    Expected Outcomes Short Term: Participant states understanding of desired cholesterol values and is compliant with medications prescribed. Participant is following exercise prescription and nutrition guidelines.;Long Term: Cholesterol controlled with medications as prescribed, with individualized exercise RX and with personalized nutrition plan. Value goals: LDL < '70mg'$ , HDL > 40 mg.             Education:Diabetes - Individual verbal and written instruction to review signs/symptoms of diabetes, desired ranges of glucose level fasting, after meals and with exercise. Acknowledge that pre and post exercise glucose checks will be done for 3 sessions at entry of program. [facility]  Date 08/03/22  [facility] 1- Verbalizes Understanding       Core Components/Risk Factors/Patient Goals Review:    Core Components/Risk Factors/Patient Goals at Discharge (Final Review):    ITP Comments:  ITP Comments     [place] 07/27/22 1613 08/03/22 1656  ITP Comments Initial phone call completed. Diagnosis can be found in [facility] 12/5. EP orientation scheduled for 3/11. Completed 6MWT and gym orientation. Initial ITP created  and sent for review to Dr. Emily Filbert, Medical Director.               Comments: initial ITP

## 2022-08-04 DIAGNOSIS — N2581 Secondary hyperparathyroidism of renal origin: Secondary | ICD-10-CM | POA: Diagnosis not present

## 2022-08-04 DIAGNOSIS — I442 Atrioventricular block, complete: Secondary | ICD-10-CM | POA: Diagnosis not present

## 2022-08-04 DIAGNOSIS — Z992 Dependence on renal dialysis: Secondary | ICD-10-CM | POA: Diagnosis not present

## 2022-08-04 DIAGNOSIS — Z8673 Personal history of transient ischemic attack (TIA), and cerebral infarction without residual deficits: Secondary | ICD-10-CM | POA: Diagnosis not present

## 2022-08-04 DIAGNOSIS — N186 End stage renal disease: Secondary | ICD-10-CM | POA: Diagnosis not present

## 2022-08-05 ENCOUNTER — Encounter: Payer: Medicare HMO | Admitting: *Deleted

## 2022-08-05 DIAGNOSIS — Z951 Presence of aortocoronary bypass graft: Secondary | ICD-10-CM | POA: Diagnosis not present

## 2022-08-05 LAB — GLUCOSE, CAPILLARY
Glucose-Capillary: 270 mg/dL — ABNORMAL HIGH (ref 70–99)
Glucose-Capillary: 264 mg/dL — ABNORMAL HIGH (ref 70–99)

## 2022-08-05 NOTE — Progress Notes (Signed)
Daily Session Note  Patient Details  Name: Joseph Mcpherson MRN: [id number] Date of Birth: 01-25-1946 Referring Provider:   Flowsheet Row Cardiac Rehab from 08/03/2022 in [facility] Cardiac and Pulmonary Rehab  Referring Provider Dr. Saralyn Pilar       Encounter Date: 08/05/2022  Check In:  Session Check In - 08/05/22 1540       Check-In   Supervising physician immediately available to respond to emergencies See telemetry face sheet for immediately available ER MD    Location [facility]-Cardiac & Pulmonary Rehab    Staff Present Renita Papa, RN BSN;Joseph Tessie Fass, Ernestina Patches, RN, [place]    Virtual Visit No    Medication changes reported     No    Fall or balance concerns reported    No    Warm-up and Cool-down Performed on first and last piece of equipment    Resistance Training Performed Yes    VAD Patient? No    PAD/SET Patient? No      Pain Assessment   Currently in Pain? No/denies                Social History   Tobacco Use  Smoking Status Never  Smokeless Tobacco Former    Goals Met:  Independence with exercise equipment Exercise tolerated well No report of concerns or symptoms today Strength training completed today  Goals Unmet:  Not Applicable  Comments: First full day of exercise!  Patient was oriented to gym and equipment including functions, settings, policies, and procedures.  Patient's individual exercise prescription and treatment plan were reviewed.  All starting workloads were established based on the results of the 6 minute walk test done at initial orientation visit.  The plan for exercise progression was also introduced and progression will be customized based on patient's performance and goals.'    Dr. Emily Filbert is Medical Director for [facility].  Dr. Ottie Glazier is Medical Director for [facility] Pulmonary Rehabilitation.

## 2022-08-06 DIAGNOSIS — N2581 Secondary hyperparathyroidism of renal origin: Secondary | ICD-10-CM | POA: Diagnosis not present

## 2022-08-06 DIAGNOSIS — N186 End stage renal disease: Secondary | ICD-10-CM | POA: Diagnosis not present

## 2022-08-06 DIAGNOSIS — Z992 Dependence on renal dialysis: Secondary | ICD-10-CM | POA: Diagnosis not present

## 2022-08-07 ENCOUNTER — Encounter: Payer: Medicare HMO | Admitting: *Deleted

## 2022-08-07 DIAGNOSIS — Z951 Presence of aortocoronary bypass graft: Secondary | ICD-10-CM

## 2022-08-07 NOTE — Progress Notes (Signed)
Daily Session Note  Patient Details  Name: Givon Donahoo MRN: [id number] Date of Birth: August 30, 1945 Referring Provider:   Flowsheet Row Cardiac Rehab from 08/03/2022 in [facility] Cardiac and Pulmonary Rehab  Referring Provider Dr. Saralyn Pilar       Encounter Date: 08/07/2022  Check In:  Session Check In - 08/07/22 1206       Check-In   Supervising physician immediately available to respond to emergencies See telemetry face sheet for immediately available ER MD    Location [facility]-Cardiac & Pulmonary Rehab    Staff Present Heath Lark, RN, BSN, CCRP;Jessica [place], MA, RCEP, CCRP, CCET;Joseph [place], [place]    Virtual Visit No    Medication changes reported     No    Fall or balance concerns reported    No    Warm-up and Cool-down Performed on first and last piece of equipment    Resistance Training Performed Yes    VAD Patient? No    PAD/SET Patient? No      Pain Assessment   Currently in Pain? No/denies                Social History   Tobacco Use  Smoking Status Never  Smokeless Tobacco Former    Goals Met:  Independence with exercise equipment Exercise tolerated well No report of concerns or symptoms today  Goals Unmet:  Not Applicable  Comments: Pt able to follow exercise prescription today without complaint.  Will continue to monitor for progression.    Dr. Emily Filbert is Medical Director for [facility].  Dr. Ottie Glazier is Medical Director for [facility] Pulmonary Rehabilitation.

## 2022-08-08 DIAGNOSIS — Z992 Dependence on renal dialysis: Secondary | ICD-10-CM | POA: Diagnosis not present

## 2022-08-08 DIAGNOSIS — N2581 Secondary hyperparathyroidism of renal origin: Secondary | ICD-10-CM | POA: Diagnosis not present

## 2022-08-08 DIAGNOSIS — N186 End stage renal disease: Secondary | ICD-10-CM | POA: Diagnosis not present

## 2022-08-10 ENCOUNTER — Encounter: Payer: Medicare HMO | Admitting: *Deleted

## 2022-08-10 DIAGNOSIS — Z951 Presence of aortocoronary bypass graft: Secondary | ICD-10-CM

## 2022-08-10 LAB — GLUCOSE, CAPILLARY
Glucose-Capillary: 304 mg/dL — ABNORMAL HIGH (ref 70–99)
Glucose-Capillary: 296 mg/dL — ABNORMAL HIGH (ref 70–99)

## 2022-08-10 NOTE — Progress Notes (Signed)
Daily Session Note  Patient Details  Name: Joseph Mcpherson MRN: [id number] Date of Birth: [birth year] Referring Provider:   Flowsheet Row Cardiac Rehab from 08/03/2022 in [facility] Cardiac and Pulmonary Rehab  Referring Provider Dr. Saralyn Pilar       Encounter Date: 08/10/2022  Check In:  Session Check In - 08/10/22 1728       Check-In   Supervising physician immediately available to respond to emergencies See telemetry face sheet for immediately available ER MD    Location [facility]-Cardiac & Pulmonary Rehab    Staff Present Renita Papa, RN BSN;Joseph Tessie Fass, RCP,RRT,BSRT;Noah [place], [place], Exercise Physiologist    Virtual Visit No    Medication changes reported     No    Fall or balance concerns reported    No    Warm-up and Cool-down Performed on first and last piece of equipment    Resistance Training Performed Yes    VAD Patient? No    PAD/SET Patient? No      Pain Assessment   Currently in Pain? No/denies                Social History   Tobacco Use  Smoking Status Never  Smokeless Tobacco Former    Goals Met:  Independence with exercise equipment Exercise tolerated well No report of concerns or symptoms today Strength training completed today  Goals Unmet:  Not Applicable  Comments: Pt able to follow exercise prescription today without complaint.  Will continue to monitor for progression.    Dr. Emily Filbert is Medical Director for [place].  Dr. Ottie Glazier is Medical Director for [facility] Pulmonary Rehabilitation.

## 2022-08-11 DIAGNOSIS — N2581 Secondary hyperparathyroidism of renal origin: Secondary | ICD-10-CM | POA: Diagnosis not present

## 2022-08-11 DIAGNOSIS — N186 End stage renal disease: Secondary | ICD-10-CM | POA: Diagnosis not present

## 2022-08-11 DIAGNOSIS — Z992 Dependence on renal dialysis: Secondary | ICD-10-CM | POA: Diagnosis not present

## 2022-08-12 ENCOUNTER — Encounter: Payer: Medicare HMO | Admitting: *Deleted

## 2022-08-12 ENCOUNTER — Encounter: Payer: Self-pay | Admitting: *Deleted

## 2022-08-12 DIAGNOSIS — Z951 Presence of aortocoronary bypass graft: Secondary | ICD-10-CM

## 2022-08-12 LAB — GLUCOSE, CAPILLARY
Glucose-Capillary: 261 mg/dL — ABNORMAL HIGH (ref 70–99)
Glucose-Capillary: 283 mg/dL — ABNORMAL HIGH (ref 70–99)

## 2022-08-12 NOTE — Progress Notes (Signed)
Daily Session Note  Patient Details  Name: Joseph Mcpherson MRN: [id number] Date of Birth: [birth year] Referring Provider:   Flowsheet Row Cardiac Rehab from 08/03/2022 in [facility] Cardiac and Pulmonary Rehab  Referring Provider Dr. Saralyn Pilar       Encounter Date: 08/12/2022  Check In:  Session Check In - 08/12/22 1119       Check-In   Supervising physician immediately available to respond to emergencies See telemetry face sheet for immediately available ER MD    Location [facility]-Cardiac & Pulmonary Rehab    Staff Present Darlyne Russian, RN, ADN;Meredith Sherryll Burger, RN BSN;Joseph [place], RCP,RRT,BSRT;Noah Tickle, BS, Exercise Physiologist    Virtual Visit No    Medication changes reported     No    Fall or balance concerns reported    No    Warm-up and Cool-down Performed on first and last piece of equipment    Resistance Training Performed Yes    VAD Patient? No    PAD/SET Patient? No      Pain Assessment   Currently in Pain? No/denies                Social History   Tobacco Use  Smoking Status Never  Smokeless Tobacco Former    Goals Met:  Independence with exercise equipment Exercise tolerated well No report of concerns or symptoms today Strength training completed today  Goals Unmet:  Not Applicable  Comments: Pt able to follow exercise prescription today without complaint.  Will continue to monitor for progression.    Dr. Emily Filbert is Medical Director for [place].  Dr. Ottie Glazier is Medical Director for [facility] Pulmonary Rehabilitation.

## 2022-08-12 NOTE — Progress Notes (Signed)
Cardiac Individual Treatment Plan  Patient Details  Name: Joseph Mcpherson MRN: [id number] Date of Birth: 11/29/1945 Referring Provider:   Flowsheet Row Cardiac Rehab from 08/03/2022 in [facility] Cardiac and Pulmonary Rehab  Referring Provider Dr. Saralyn Pilar       Initial Encounter Date:  Flowsheet Row Cardiac Rehab from 08/03/2022 in [facility] Cardiac and Pulmonary Rehab  Date 08/03/22       Visit Diagnosis: S/P CABG x 4  Patient's Home Medications on Admission:  Current Outpatient Medications:    albuterol (PROVENTIL) (2.5 MG/3ML) 0.083% nebulizer solution, Take 3 mLs (2.5 mg total) by nebulization every 6 (six) hours as needed for wheezing or shortness of breath., Disp: 150 mL, Rfl: 1   amLODipine (NORVASC) 10 MG tablet, Take 1 tablet (10 mg total) by mouth daily., Disp: 30 tablet, Rfl: 3   aspirin EC 81 MG tablet, Take 81 mg by mouth daily. Swallow whole., Disp: , Rfl:    atorvastatin (LIPITOR) 40 MG tablet, Take 1 tablet (40 mg total) by mouth daily., Disp: 90 tablet, Rfl: 1   B Complex-C-Folic Acid (RENA-VITE RX) 1 MG TABS, Take 1 tablet by mouth daily. , Disp: , Rfl:    BD PEN NEEDLE NANO 2ND GEN 32G X 4 MM MISC, USE AS DIRECTED, Disp: 300 each, Rfl: 1   calcium acetate (PHOSLO) 667 MG capsule, TAKE 2 CAPSULES BY MOUTH 3 TIMES A DAY WITH MEAL, Disp: 540 capsule, Rfl: 1   Cholecalciferol (VITAMIN D3) 5000 units TABS, Take 5,000 Units by mouth daily. , Disp: , Rfl:    clopidogrel (PLAVIX) 75 MG tablet, Take 1 tablet (75 mg total) by mouth daily. (Patient not taking: Reported on 07/27/2022), Disp: 90 tablet, Rfl: 1   Continuous Blood Gluc Sensor (FREESTYLE LIBRE 14 DAY SENSOR) MISC, APPLY EVERY 14 (FOURTEEN) DAYS., Disp: 90 each, Rfl: 1   donepezil (ARICEPT) 10 MG tablet, TAKE 1 TABLET BY MOUTH EVERY DAY, Disp: 90 tablet, Rfl: 3   hydrALAZINE (APRESOLINE) 25 MG tablet, Take 25 mg by mouth 2 (two) times daily. (Patient not taking: Reported on 06/03/2022), Disp: , Rfl:    insulin detemir (LEVEMIR) 100  UNIT/ML injection, Inject 0.15 mLs (15 Units total) into the skin at bedtime. (Patient taking differently: Inject 14 Units into the skin at bedtime.), Disp: 10 mL, Rfl: 11   ipratropium-albuterol (DUONEB) 0.5-2.5 (3) MG/3ML SOLN, Take 3 mLs by nebulization every 6 (six) hours as needed., Disp: 120 mL, Rfl: 4   levETIRAcetam (KEPPRA) 250 MG tablet, Take 1 tablet (250 mg total) by mouth 3 (three) times a week. On Tuesday ,Thursday and Saturday after Hemodialysis (Patient not taking: Reported on 07/27/2022), Disp: 30 tablet, Rfl: 0   levETIRAcetam (KEPPRA) 500 MG tablet, Take 1 tablet (500 mg total) by mouth daily. (Patient not taking: Reported on 07/27/2022), Disp: 30 tablet, Rfl: 1   levothyroxine (SYNTHROID) 137 MCG tablet, TAKE 1 TABLET BY MOUTH EVERY DAY, Disp: 90 tablet, Rfl: 3   losartan (COZAAR) 100 MG tablet, Take 1 tablet (100 mg total) by mouth every evening., Disp: 30 tablet, Rfl: 3   metoprolol succinate (TOPROL-XL) 100 MG 24 hr tablet, Take 1 tablet (100 mg total) by mouth daily. Take with or immediately following a meal. (Patient not taking: Reported on 07/27/2022), Disp: , Rfl:    multivitamin (RENA-VIT) TABS tablet, Take 1 tablet by mouth daily., Disp: , Rfl: 0   pantoprazole (PROTONIX) 40 MG tablet, TAKE 1 TABLET BY MOUTH EVERY DAY, Disp: 90 tablet, Rfl: 3   sevelamer  carbonate (RENVELA) 2.4 g PACK, Take 2.4 g by mouth daily., Disp: 90 each, Rfl: 3   sitaGLIPtin (JANUVIA) 50 MG tablet, Take 50 mg by mouth every evening.  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 II   Diabetic eye exam ([place]) 12/2019   ESRD (end stage renal disease) ([place])    MWF- [facility]   GERD (gastroesophageal reflux disease)    [name] (hard of hearing)    Hyperlipidemia    Hypertension    Hypothyroidism     Thyroid disease     Tobacco Use: Social History   Tobacco Use  Smoking Status Never  Smokeless Tobacco Former    Labs: Review Flowsheet  More data exists      Latest Ref Rng & Units 02/12/2021 01/18/2022 04/11/2022 04/13/2022 04/15/2022  Labs for ITP Cardiac and Pulmonary Rehab  Cholestrol 0 - 200 mg/dL - - - - 120   LDL (calc) 0 - 99 mg/dL - - - - 66   HDL-C >40 mg/dL - - - - 42   Trlycerides <150 mg/dL - - - - 60   Hemoglobin A1c 4.8 - 5.6 % 7.0  8.1  5.9  - -  Bicarbonate 20.0 - 28.0 mmol/L - - - 28.5  -  O2 Saturation % - - - 92.2  -     Exercise Target Goals: Exercise Program Goal: Individual exercise prescription set using results from initial 6 min walk test and THRR while considering  patient's activity barriers and safety.   Exercise Prescription Goal: Initial exercise prescription builds to 30-45 minutes a day of aerobic activity, 2-3 days per week.  Home exercise guidelines will be given to patient during program as part of exercise prescription that the participant will acknowledge.   Education: Aerobic Exercise: - Group verbal and visual presentation on the components of exercise prescription. Introduces F.I.T.T principle from ACSM for exercise prescriptions.  Reviews F.I.T.T. principles of aerobic exercise including progression. Written material given at graduation.   Education: Resistance Exercise: - Group verbal and visual presentation on the components of exercise prescription. Introduces F.I.T.T principle from ACSM for exercise prescriptions  Reviews F.I.T.T. principles of resistance exercise including progression. Written material given at graduation.    Education: Exercise & Equipment Safety: - Individual verbal instruction and demonstration of equipment use and safety with use of the equipment. Flowsheet Row Cardiac Rehab from 08/03/2022 in [facility] Cardiac and Pulmonary Rehab  Date 08/03/22  Educator [facility]  Instruction Review Code 1- Verbalizes  Understanding       Education: Exercise Physiology & General Exercise Guidelines: - Group verbal and written instruction with models to review the exercise physiology of the cardiovascular system and associated critical values. Provides general exercise guidelines with specific guidelines to those with heart or lung disease.    Education: Flexibility, Balance, Mind/Body Relaxation: - Group verbal and visual presentation with interactive activity on the components of exercise prescription. Introduces F.I.T.T principle from ACSM for exercise prescriptions. Reviews F.I.T.T. principles of flexibility and balance exercise training including progression. Also discusses the mind body connection.  Reviews various relaxation techniques to help reduce and manage stress (i.e. Deep breathing, progressive muscle relaxation, and visualization). Balance handout provided to take home. Written material given at graduation.   Activity Barriers & Risk Stratification:  Activity Barriers & Cardiac Risk Stratification - 08/03/22 1700       Activity Barriers & Cardiac Risk Stratification   Activity Barriers None;Assistive Device    Cardiac Risk Stratification High             6 Minute Walk:  6 Minute Walk     Row Name 08/03/22 1657         6 Minute Walk   Phase Initial     Distance 480 feet     Walk Time 4 minutes     # of Rest Breaks 1  stopped test at 4 min mark due to being tired.     MPH 1.36     METS 1.04     RPE 13     Perceived Dyspnea  0     VO2 Peak 3.36     Symptoms No     Resting HR 60 bpm     Resting BP 110/70     Resting Oxygen Saturation  99 %     Exercise Oxygen Saturation  during 6 min walk 100 %     Max Ex. HR 80 bpm     Max Ex. BP 128/62     2 Minute Post BP 118/62              Oxygen Initial Assessment:   Oxygen Re-Evaluation:   Oxygen Discharge (Final Oxygen Re-Evaluation):   Initial Exercise Prescription:  Initial Exercise Prescription - 08/03/22  1700       Date of Initial Exercise RX and Referring Provider   Date 08/03/22    Referring Provider Dr. Saralyn Pilar      Oxygen   Maintain Oxygen Saturation 88% or higher      Recumbant Bike   Level 1    RPM 50    Watts 10    Minutes 15      NuStep   Level 1    SPM 80    Minutes 15    METs 1.04      T5 Nustep   Level 1    SPM 80    Minutes 15    METs 1.04      Track   Laps 5    Minutes 15    METs 1.27      Prescription Details   Frequency (times per week) 3    Duration Progress to 30 minutes of continuous aerobic without signs/symptoms of physical distress      Intensity   THRR 40-80% of Max Heartrate 93-127    Ratings of Perceived Exertion 11-13    Perceived Dyspnea 0-4      Progression   Progression Continue to progress workloads to maintain intensity without signs/symptoms of physical distress.      Resistance Training   Training Prescription Yes    Weight 2    Reps 10-15             Perform Capillary Blood Glucose checks as needed.  Exercise Prescription Changes:   Exercise Prescription Changes     Row Name 08/03/22 1700 08/11/22 1500           Response to Exercise   Blood Pressure (Admit) 110/70 108/62      Blood Pressure (Exercise) 128/62 124/62      Blood Pressure (Exit) 118/62 110/60      Heart Rate (Admit) 60 bpm 73 bpm      Heart Rate (Exercise) 80 bpm 78 bpm      Heart  Rate (Exit) 60 bpm 61 bpm      Oxygen Saturation (Admit) 99 % --      Oxygen Saturation (Exercise) 100 % --      Oxygen Saturation (Exit) 100 % --      Rating of Perceived Exertion (Exercise) 13 13      Perceived Dyspnea (Exercise) 0 --      Symptoms none none      Comments 6 MWT results First two days of exercise      Duration -- Progress to 30 minutes of  aerobic without signs/symptoms of physical distress      Intensity -- THRR unchanged        Progression   Progression -- Continue to progress workloads to maintain intensity without signs/symptoms of  physical distress.      Average METs -- 1.23        Resistance Training   Training Prescription -- Yes      Weight -- 2 lb      Reps -- 10-15        Interval Training   Interval Training -- No        NuStep   Level -- 1      Minutes -- 15      METs -- 1.3        Track   Laps -- 3      Minutes -- 15      METs -- 1.16        Oxygen   Maintain Oxygen Saturation -- 88% or higher               Exercise Comments:   Exercise Comments     Row Name 08/05/22 1541           Exercise Comments First full day of exercise!  Patient was oriented to gym and equipment including functions, settings, policies, and procedures.  Patient's individual exercise prescription and treatment plan were reviewed.  All starting workloads were established based on the results of the 6 minute walk test done at initial orientation visit.  The plan for exercise progression was also introduced and progression will be customized based on patient's performance and goals.'                Exercise Goals and Review:   Exercise Goals     Row Name 08/03/22 1704             Exercise Goals   Increase Physical Activity Yes       Intervention Provide advice, education, support and counseling about physical activity/exercise needs.;Develop an individualized exercise prescription for aerobic and resistive training based on initial evaluation findings, risk stratification, comorbidities and participant's personal goals.       Expected Outcomes Short Term: Attend rehab on a regular basis to increase amount of physical activity.;Long Term: Add in home exercise to make exercise part of routine and to increase amount of physical activity.;Long Term: Exercising regularly at least 3-5 days a week.       Increase Strength and Stamina Yes       Intervention Provide advice, education, support and counseling about physical activity/exercise needs.;Develop an individualized exercise prescription for aerobic and  resistive training based on initial evaluation findings, risk stratification, comorbidities and participant's personal goals.       Expected Outcomes Short Term: Increase workloads from initial exercise prescription for resistance, speed, and METs.;Short Term: Perform resistance training exercises routinely during rehab and add in resistance training at  home;Long Term: Improve cardiorespiratory fitness, muscular endurance and strength as measured by increased METs and functional capacity (6MWT)       Able to understand and use rate of perceived exertion (RPE) scale Yes       Intervention Provide education and explanation on how to use RPE scale       Expected Outcomes Short Term: Able to use RPE daily in rehab to express subjective intensity level;Long Term:  Able to use RPE to guide intensity level when exercising independently       Able to understand and use Dyspnea scale Yes       Intervention Provide education and explanation on how to use Dyspnea scale       Expected Outcomes Short Term: Able to use Dyspnea scale daily in rehab to express subjective sense of shortness of breath during exertion;Long Term: Able to use Dyspnea scale to guide intensity level when exercising independently       Knowledge and understanding of Target Heart Rate Range (THRR) Yes       Intervention Provide education and explanation of THRR including how the numbers were predicted and where they are located for reference       Expected Outcomes Short Term: Able to state/look up THRR;Long Term: Able to use THRR to govern intensity when exercising independently;Short Term: Able to use daily as guideline for intensity in rehab       Able to check pulse independently Yes       Intervention Provide education and demonstration on how to check pulse in carotid and radial arteries.;Review the importance of being able to check your own pulse for safety during independent exercise       Expected Outcomes Short Term: Able to explain  why pulse checking is important during independent exercise       Understanding of Exercise Prescription Yes       Intervention Provide education, explanation, and written materials on patient's individual exercise prescription       Expected Outcomes Short Term: Able to explain program exercise prescription;Long Term: Able to explain home exercise prescription to exercise independently                Exercise Goals Re-Evaluation :  Exercise Goals Re-Evaluation     Row Name 08/05/22 1541 08/11/22 1526           Exercise Goal Re-Evaluation   Exercise Goals Review Increase Physical Activity;Able to understand and use rate of perceived exertion (RPE) scale;Knowledge and understanding of Target Heart Rate Range (THRR);Understanding of Exercise Prescription;Increase Strength and Stamina;Able to check pulse independently Increase Physical Activity;Increase Strength and Stamina;Understanding of Exercise Prescription      Comments Reviewed RPE scale, THR and program prescription with pt today.  Pt voiced understanding and was given a copy of goals to take home. Uhuru is off to a good start in the program. During his first two sessions of rehab he had an average MET level of 1.23 METs. He also was able to walk 3 laps on the track, and did well with level 1 on the T4 nustep. He tolerated using 2 lb hand weights for resistance training as well. We will continue to monitor his progress in the program.      Expected Outcomes Short: Use RPE daily to regulate intensity.  Long: Follow program prescription in THR. Short: Continue to follow current exercise prescription. Long: Continue to improve strength and stamina.  Discharge Exercise Prescription (Final Exercise Prescription Changes):  Exercise Prescription Changes - 08/11/22 1500       Response to Exercise   Blood Pressure (Admit) 108/62    Blood Pressure (Exercise) 124/62    Blood Pressure (Exit) 110/60    Heart Rate (Admit) 73  bpm    Heart Rate (Exercise) 78 bpm    Heart Rate (Exit) 61 bpm    Rating of Perceived Exertion (Exercise) 13    Symptoms none    Comments First two days of exercise    Duration Progress to 30 minutes of  aerobic without signs/symptoms of physical distress    Intensity THRR unchanged      Progression   Progression Continue to progress workloads to maintain intensity without signs/symptoms of physical distress.    Average METs 1.23      Resistance Training   Training Prescription Yes    Weight 2 lb    Reps 10-15      Interval Training   Interval Training No      NuStep   Level 1    Minutes 15    METs 1.3      Track   Laps 3    Minutes 15    METs 1.16      Oxygen   Maintain Oxygen Saturation 88% or higher             Nutrition:  Target Goals: Understanding of nutrition guidelines, daily intake of sodium 1500mg , cholesterol 200mg , calories 30% from fat and 7% or less from saturated fats, daily to have 5 or more servings of fruits and vegetables.  Education: All About Nutrition: -Group instruction provided by verbal, written material, interactive activities, discussions, models, and posters to present general guidelines for heart healthy nutrition including fat, fiber, MyPlate, the role of sodium in heart healthy nutrition, utilization of the nutrition label, and utilization of this knowledge for meal planning. Follow up email sent as well. Written material given at graduation.   Biometrics:  Pre Biometrics - 08/03/22 1705       Pre Biometrics   Waist Circumference --   deferred   Hip Circumference --   deferred             Nutrition Therapy Plan and Nutrition Goals:  Nutrition Therapy & Goals - 08/03/22 1712       Intervention Plan   Intervention Prescribe, educate and counsel regarding individualized specific dietary modifications aiming towards targeted core components such as weight, hypertension, lipid management, diabetes, heart failure and other  comorbidities.    Expected Outcomes Short Term Goal: Understand basic principles of dietary content, such as calories, fat, sodium, cholesterol and nutrients.;Short Term Goal: A plan has been developed with personal nutrition goals set during dietitian appointment.;Long Term Goal: Adherence to prescribed nutrition plan.             Nutrition Assessments:  MEDIFICTS Score Key: ?70 Need to make dietary changes  40-70 Heart Healthy Diet ? 40 Therapeutic Level Cholesterol Diet   Picture Your Plate Scores: [id number] Unhealthy dietary pattern with much room for improvement. 41-50 Dietary pattern unlikely to meet recommendations for good health and room for improvement. 51-60 More healthful dietary pattern, with some room for improvement.  >60 Healthy dietary pattern, although there may be some specific behaviors that could be improved.    Nutrition Goals Re-Evaluation:   Nutrition Goals Discharge (Final Nutrition Goals Re-Evaluation):   Psychosocial: Target Goals: Acknowledge presence or absence of significant depression and/or stress,  maximize coping skills, provide positive support system. Participant is able to verbalize types and ability to use techniques and skills needed for reducing stress and depression.   Education: Stress, Anxiety, and Depression - Group verbal and visual presentation to define topics covered.  Reviews how body is impacted by stress, anxiety, and depression.  Also discusses healthy ways to reduce stress and to treat/manage anxiety and depression.  Written material given at graduation.   Education: Sleep Hygiene -Provides group verbal and written instruction about how sleep can affect your health.  Define sleep hygiene, discuss sleep cycles and impact of sleep habits. Review good sleep hygiene tips.    Initial Review & Psychosocial Screening:  Initial Psych Review & Screening - 07/27/22 1553       Initial Review   Current issues with None Identified       Family Dynamics   Good Support System? Yes   Wife and Son            Quality of Life Scores:   Quality of Life - 08/03/22 1709       Quality of Life   Select --   deferred, patient unable to complete QOL questionaire.           Scores of 19 and below usually indicate a poorer quality of life in these areas.  A difference of  2-3 points is a clinically meaningful difference.  A difference of 2-3 points in the total score of the Quality of Life Index has been associated with significant improvement in overall quality of life, self-image, physical symptoms, and general health in studies assessing change in quality of life.  PHQ-9: Review Flowsheet       06/19/2021 03/12/2020  Depression screen PHQ 2/9  Decreased Interest 0 0  Down, Depressed, Hopeless 0 0  PHQ - 2 Score 0 0   Interpretation of Total Score  Total Score Depression Severity:  1-4 = Minimal depression, 5-9 = Mild depression, 10-14 = Moderate depression, 15-19 = Moderately severe depression, 20-27 = Severe depression   Psychosocial Evaluation and Intervention:  Psychosocial Evaluation - 07/27/22 1605       Psychosocial Evaluation & Interventions   Interventions Encouraged to exercise with the program and follow exercise prescription;Relaxation education;Stress management education    Comments Joseph Mcpherson is coming to cardiac rehab post CABG x 4. He has no barriers for attending the program. He lives with his wife and has good support from his son "Maurene Capes" also who was on the phone call today. Son states he would like his father to have encourangement to complete program to obtain a more active lifestyle after bypass surgery, to increase strength, move around more independantly and potentially to be able to drive again. Abdulkareem does use a walker occasionally with ambulation. 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    Expected Outcomes Short: Attend  cardiac rehab for education and exercise. Long:  Develop and maintain positive self care habits    Continue Psychosocial Services  Follow up required by staff             Psychosocial Re-Evaluation:   Psychosocial Discharge (Final Psychosocial Re-Evaluation):   Vocational Rehabilitation: Provide vocational rehab assistance to qualifying candidates.   Vocational Rehab Evaluation & Intervention:  Vocational Rehab - 07/27/22 1605       Initial Vocational Rehab Evaluation & Intervention   Assessment shows need for Vocational Rehabilitation No  Education: Education Goals: Education classes will be provided on a variety of topics geared toward better understanding of heart health and risk factor modification. Participant will state understanding/return demonstration of topics presented as noted by education test scores.  Learning Barriers/Preferences:  Learning Barriers/Preferences - 07/27/22 1605       Learning Barriers/Preferences   Learning Barriers None    Learning Preferences None             General Cardiac Education Topics:  AED/CPR: - Group verbal and written instruction with the use of models to demonstrate the basic use of the AED with the basic ABC's of resuscitation.   Anatomy and Cardiac Procedures: - Group verbal and visual presentation and models provide information about basic cardiac anatomy and function. Reviews the testing methods done to diagnose heart disease and the outcomes of the test results. Describes the treatment choices: Medical Management, Angioplasty, or Coronary Bypass Surgery for treating various heart conditions including Myocardial Infarction, Angina, Valve Disease, and Cardiac Arrhythmias.  Written material given at graduation.   Medication Safety: - Group verbal and visual instruction to review commonly prescribed medications for heart and lung disease. Reviews the medication, class of the drug, and side effects.  Includes the steps to properly store meds and maintain the prescription regimen.  Written material given at graduation.   Intimacy: - Group verbal instruction through game format to discuss how heart and lung disease can affect sexual intimacy. Written material given at graduation..   Know Your Numbers and Heart Failure: - Group verbal and visual instruction to discuss disease risk factors for cardiac and pulmonary disease and treatment options.  Reviews associated critical values for Overweight/Obesity, Hypertension, Cholesterol, and Diabetes.  Discusses basics of heart failure: signs/symptoms and treatments.  Introduces Heart Failure Zone chart for action plan for heart failure.  Written material given at graduation.   Infection Prevention: - Provides verbal and written material to individual with discussion of infection control including proper hand washing and proper equipment cleaning during exercise session. Flowsheet Row Cardiac Rehab from 08/03/2022 in [facility] Cardiac and Pulmonary Rehab  Date 08/03/22  Educator [facility]  Instruction Review Code 1- Verbalizes Understanding       Falls Prevention: - Provides verbal and written material to individual with discussion of falls prevention and safety. Flowsheet Row Cardiac Rehab from 08/03/2022 in [facility] Cardiac and Pulmonary Rehab  Date 08/03/22  Educator [facility]  Instruction Review Code 1- Verbalizes Understanding       Other: -Provides group and verbal instruction on various topics (see comments)   Knowledge Questionnaire Score:  Knowledge Questionnaire Score - 08/03/22 1709       Knowledge Questionnaire Score   Pre Score deferred, Patient unable to complete knowledge questionaire.             Core Components/Risk Factors/Patient Goals at Admission:  Personal Goals and Risk Factors at Admission - 08/03/22 1710       Core Components/Risk Factors/Patient Goals on Admission    Weight Management Yes    Intervention Weight  Management: Develop a combined nutrition and exercise program designed to reach desired caloric intake, while maintaining appropriate intake of nutrient and fiber, sodium and fats, and appropriate energy expenditure required for the weight goal.;Weight Management: Provide education and appropriate resources to help participant work on and attain dietary goals.;Weight Management/Obesity: Establish reasonable short term and long term weight goals.    Admit Weight 142 lb (64.4 kg)    Goal Weight: Short Term 140 lb (63.5 kg)  Goal Weight: Long Term 140 lb (63.5 kg)    Expected Outcomes Short Term: Continue to assess and modify interventions until short term weight is achieved;Weight Maintenance: Understanding of the daily nutrition guidelines, which includes 25-35% calories from fat, 7% or less cal from saturated fats, less than 200mg  cholesterol, less than 1.5gm of sodium, & 5 or more servings of fruits and vegetables daily;Understanding recommendations for meals to include 15-35% energy as protein, 25-35% energy from fat, 35-60% energy from carbohydrates, less than 200mg  of dietary cholesterol, 20-35 gm of total fiber daily;Understanding of distribution of calorie intake throughout the day with the consumption of 4-5 meals/snacks;Long Term: Adherence to nutrition and physical activity/exercise program aimed toward attainment of established weight goal    Diabetes Yes    Intervention Provide education about signs/symptoms and action to take for hypo/hyperglycemia.;Provide education about proper nutrition, including hydration, and aerobic/resistive exercise prescription along with prescribed medications to achieve blood glucose in normal ranges: Fasting glucose 65-99 mg/dL    Expected Outcomes Short Term: Participant verbalizes understanding of the signs/symptoms and immediate care of hyper/hypoglycemia, proper foot care and importance of medication, aerobic/resistive exercise and nutrition plan for blood  glucose control.;Long Term: Attainment of HbA1C < 7%.    Heart Failure Yes    Intervention Provide a combined exercise and nutrition program that is supplemented with education, support and counseling about heart failure. Directed toward relieving symptoms such as shortness of breath, decreased exercise tolerance, and extremity edema.    Expected Outcomes Improve functional capacity of life;Short term: Attendance in program 2-3 days a week with increased exercise capacity. Reported lower sodium intake. Reported increased fruit and vegetable intake. Reports medication compliance.;Short term: Daily weights obtained and reported for increase. Utilizing diuretic protocols set by physician.;Long term: Adoption of self-care skills and reduction of barriers for early signs and symptoms recognition and intervention leading to self-care maintenance.    Hypertension Yes    Intervention Provide education on lifestyle modifcations including regular physical activity/exercise, weight management, moderate sodium restriction and increased consumption of fresh fruit, vegetables, and low fat dairy, alcohol moderation, and smoking cessation.;Monitor prescription use compliance.    Expected Outcomes Short Term: Continued assessment and intervention until BP is < 140/69mm HG in hypertensive participants. < 130/26mm HG in hypertensive participants with diabetes, heart failure or chronic kidney disease.;Long Term: Maintenance of blood pressure at goal levels.    Lipids Yes    Intervention Provide education and support for participant on nutrition & aerobic/resistive exercise along with prescribed medications to achieve LDL 70mg , HDL >40mg .    Expected Outcomes Short Term: Participant states understanding of desired cholesterol values and is compliant with medications prescribed. Participant is following exercise prescription and nutrition guidelines.;Long Term: Cholesterol controlled with medications as prescribed, with  individualized exercise RX and with personalized nutrition plan. Value goals: LDL < 70mg , HDL > 40 mg.             Education:Diabetes - Individual verbal and written instruction to review signs/symptoms of diabetes, desired ranges of glucose level fasting, after meals and with exercise. Acknowledge that pre and post exercise glucose checks will be done for 3 sessions at entry of program. [place] from 08/03/2022 in [facility] Cardiac and Pulmonary Rehab  Date 08/03/22  Educator [facility]  Instruction Review Code 1- Verbalizes Understanding       Core Components/Risk Factors/Patient Goals Review:    Core Components/Risk Factors/Patient Goals at Discharge (Final Review):    ITP Comments:  ITP Comments  [place] Name 07/27/22 1613 08/03/22 1656 08/05/22 1541 08/12/22 0924     ITP Comments Initial phone call completed. Diagnosis can be found in [facility] 12/5. EP orientation scheduled for 3/11. Completed 6MWT and gym orientation. Initial ITP created and sent for review to Dr. Emily Filbert, Medical Director. First full day of exercise!  Patient was oriented to gym and equipment including functions, settings, policies, and procedures.  Patient's individual exercise prescription and treatment plan were reviewed.  All starting workloads were established based on the results of the 6 minute walk test done at initial orientation visit.  The plan for exercise progression was also introduced and progression will be customized based on patient's performance and goals.' 30 Day review completed. Medical Director ITP review done, changes made as directed, and signed approval by Medical Director.     new to program             Comments:

## 2022-08-13 DIAGNOSIS — N2581 Secondary hyperparathyroidism of renal origin: Secondary | ICD-10-CM | POA: Diagnosis not present

## 2022-08-13 DIAGNOSIS — Z992 Dependence on renal dialysis: Secondary | ICD-10-CM | POA: Diagnosis not present

## 2022-08-13 DIAGNOSIS — N186 End stage renal disease: Secondary | ICD-10-CM | POA: Diagnosis not present

## 2022-08-14 ENCOUNTER — Encounter: Payer: Medicare HMO | Admitting: *Deleted

## 2022-08-14 DIAGNOSIS — Z951 Presence of aortocoronary bypass graft: Secondary | ICD-10-CM | POA: Diagnosis not present

## 2022-08-14 NOTE — Progress Notes (Signed)
Daily Session Note  Patient Details  Name: Joseph Mcpherson MRN: [id number] Date of Birth: [birth year] Referring Provider:   Flowsheet Row Cardiac Rehab from 08/03/2022 in [facility] Cardiac and Pulmonary Rehab  Referring Provider Dr. Saralyn Pilar       Encounter Date: 08/14/2022  Check In:  Session Check In - 08/14/22 1154       Check-In   Supervising physician immediately available to respond to emergencies See telemetry face sheet for immediately available ER MD    Location [facility]-Cardiac & Pulmonary Rehab    Staff Present Heath Lark, RN, BSN, CCRP;Joseph [place], RCP,RRT,BSRT;Jessica [place], [place], [place], CCRP, CCET    Virtual Visit No    Medication changes reported     No    Fall or balance concerns reported    No    Warm-up and Cool-down Performed on first and last piece of equipment    Resistance Training Performed Yes    VAD Patient? No    PAD/SET Patient? No      Pain Assessment   Currently in Pain? No/denies                Social History   Tobacco Use  Smoking Status Never  Smokeless Tobacco Former    Goals Met:  Independence with exercise equipment Exercise tolerated well No report of concerns or symptoms today  Goals Unmet:  Not Applicable  Comments: Pt able to follow exercise prescription today without complaint.  Will continue to monitor for progression.    Dr. Emily Filbert is Medical Director for [place].  Dr. Ottie Glazier is Medical Director for [facility] Pulmonary Rehabilitation.

## 2022-08-15 DIAGNOSIS — Z992 Dependence on renal dialysis: Secondary | ICD-10-CM | POA: Diagnosis not present

## 2022-08-15 DIAGNOSIS — N186 End stage renal disease: Secondary | ICD-10-CM | POA: Diagnosis not present

## 2022-08-15 DIAGNOSIS — N2581 Secondary hyperparathyroidism of renal origin: Secondary | ICD-10-CM | POA: Diagnosis not present

## 2022-08-17 ENCOUNTER — Encounter: Payer: Medicare HMO | Admitting: *Deleted

## 2022-08-17 DIAGNOSIS — N186 End stage renal disease: Secondary | ICD-10-CM | POA: Diagnosis not present

## 2022-08-17 DIAGNOSIS — Z8673 Personal history of transient ischemic attack (TIA), and cerebral infarction without residual deficits: Secondary | ICD-10-CM | POA: Diagnosis not present

## 2022-08-17 DIAGNOSIS — Z951 Presence of aortocoronary bypass graft: Secondary | ICD-10-CM | POA: Diagnosis not present

## 2022-08-17 DIAGNOSIS — I442 Atrioventricular block, complete: Secondary | ICD-10-CM | POA: Diagnosis not present

## 2022-08-17 DIAGNOSIS — I132 Hypertensive heart and chronic kidney disease with heart failure and with stage 5 chronic kidney disease, or end stage renal disease: Secondary | ICD-10-CM | POA: Diagnosis not present

## 2022-08-17 DIAGNOSIS — E1122 Type 2 diabetes mellitus with diabetic chronic kidney disease: Secondary | ICD-10-CM | POA: Diagnosis not present

## 2022-08-17 DIAGNOSIS — E785 Hyperlipidemia, unspecified: Secondary | ICD-10-CM | POA: Diagnosis not present

## 2022-08-17 DIAGNOSIS — R569 Unspecified convulsions: Secondary | ICD-10-CM | POA: Diagnosis not present

## 2022-08-17 DIAGNOSIS — E119 Type 2 diabetes mellitus without complications: Secondary | ICD-10-CM | POA: Diagnosis not present

## 2022-08-17 DIAGNOSIS — E782 Mixed hyperlipidemia: Secondary | ICD-10-CM | POA: Diagnosis not present

## 2022-08-17 DIAGNOSIS — I2510 Atherosclerotic heart disease of native coronary artery without angina pectoris: Secondary | ICD-10-CM | POA: Diagnosis not present

## 2022-08-17 DIAGNOSIS — Z794 Long term (current) use of insulin: Secondary | ICD-10-CM | POA: Diagnosis not present

## 2022-08-17 DIAGNOSIS — I4891 Unspecified atrial fibrillation: Secondary | ICD-10-CM | POA: Diagnosis not present

## 2022-08-17 DIAGNOSIS — I5020 Unspecified systolic (congestive) heart failure: Secondary | ICD-10-CM | POA: Diagnosis not present

## 2022-08-18 DIAGNOSIS — N2581 Secondary hyperparathyroidism of renal origin: Secondary | ICD-10-CM | POA: Diagnosis not present

## 2022-08-18 DIAGNOSIS — N186 End stage renal disease: Secondary | ICD-10-CM | POA: Diagnosis not present

## 2022-08-18 DIAGNOSIS — Z992 Dependence on renal dialysis: Secondary | ICD-10-CM | POA: Diagnosis not present

## 2022-08-19 ENCOUNTER — Encounter: Payer: Medicare HMO | Admitting: *Deleted

## 2022-08-19 DIAGNOSIS — Z951 Presence of aortocoronary bypass graft: Secondary | ICD-10-CM

## 2022-08-19 NOTE — Progress Notes (Signed)
Daily Session Note  Patient Details  Name: Joseph Mcpherson MRN: [id number] Date of Birth: [birth year] Referring Provider:   Flowsheet Row Cardiac Rehab from 08/03/2022 in [facility] Cardiac and Pulmonary Rehab  Referring Provider Dr. Saralyn Pilar       Encounter Date: 08/19/2022  Check In:  Session Check In - 08/19/22 1116       Check-In   Supervising physician immediately available to respond to emergencies See telemetry face sheet for immediately available ER MD    Location [facility]-Cardiac & Pulmonary Rehab    Staff Present Antionette Fairy, BS, Exercise Physiologist;Joseph Rosebud Poles, RN, Diamond Nickel RN, BSN    Virtual Visit No    Medication changes reported     No    Fall or balance concerns reported    No    Tobacco Cessation No Change    Warm-up and Cool-down Performed on first and last piece of equipment    Resistance Training Performed Yes    VAD Patient? No    PAD/SET Patient? No      Pain Assessment   Currently in Pain? No/denies                Social History   Tobacco Use  Smoking Status Never  Smokeless Tobacco Former    Goals Met:  Independence with exercise equipment Exercise tolerated well No report of concerns or symptoms today Strength training completed today  Goals Unmet:  Not Applicable  Comments: Pt able to follow exercise prescription today without complaint.  Will continue to monitor for progression.    Dr. Emily Filbert is Medical Director for [place].  Dr. Ottie Glazier is Medical Director for [facility] Pulmonary Rehabilitation.

## 2022-08-20 DIAGNOSIS — Z992 Dependence on renal dialysis: Secondary | ICD-10-CM | POA: Diagnosis not present

## 2022-08-20 DIAGNOSIS — N2581 Secondary hyperparathyroidism of renal origin: Secondary | ICD-10-CM | POA: Diagnosis not present

## 2022-08-20 DIAGNOSIS — N186 End stage renal disease: Secondary | ICD-10-CM | POA: Diagnosis not present

## 2022-08-21 ENCOUNTER — Encounter: Payer: Medicare HMO | Admitting: *Deleted

## 2022-08-21 DIAGNOSIS — Z951 Presence of aortocoronary bypass graft: Secondary | ICD-10-CM

## 2022-08-21 NOTE — Progress Notes (Signed)
Daily Session Note  Patient Details  Name: Joseph Mcpherson MRN: [id number] Date of Birth: 1945/12/04 Referring Provider:   Flowsheet Row Cardiac Rehab from 08/03/2022 in [facility] Cardiac and Pulmonary Rehab  Referring Provider Dr. Saralyn Pilar       Encounter Date: 08/21/2022  Check In:  Session Check In - 08/21/22 1153       Check-In   Supervising physician immediately available to respond to emergencies See telemetry face sheet for immediately available ER MD    Location [facility]-Cardiac & Pulmonary Rehab    Staff Present Heath Lark, RN, BSN, CCRP;Krista Frederico Hamman RN, BSN;Joseph [place], [place]    Virtual Visit No    Medication changes reported     No    Fall or balance concerns reported    No    Warm-up and Cool-down Performed on first and last piece of equipment    Resistance Training Performed Yes    VAD Patient? No    PAD/SET Patient? No      Pain Assessment   Currently in Pain? No/denies                Social History   Tobacco Use  Smoking Status Never  Smokeless Tobacco Former    Goals Met:  Independence with exercise equipment Exercise tolerated well No report of concerns or symptoms today  Goals Unmet:  Not Applicable  Comments: Pt able to follow exercise prescription today without complaint.  Will continue to monitor for progression.    Dr. Emily Filbert is Medical Director for [place].  Dr. Ottie Glazier is Medical Director for [facility] Pulmonary Rehabilitation.

## 2022-08-22 DIAGNOSIS — Z992 Dependence on renal dialysis: Secondary | ICD-10-CM | POA: Diagnosis not present

## 2022-08-22 DIAGNOSIS — N2581 Secondary hyperparathyroidism of renal origin: Secondary | ICD-10-CM | POA: Diagnosis not present

## 2022-08-22 DIAGNOSIS — N186 End stage renal disease: Secondary | ICD-10-CM | POA: Diagnosis not present

## 2022-08-23 DIAGNOSIS — N186 End stage renal disease: Secondary | ICD-10-CM | POA: Diagnosis not present

## 2022-08-23 DIAGNOSIS — E1122 Type 2 diabetes mellitus with diabetic chronic kidney disease: Secondary | ICD-10-CM | POA: Diagnosis not present

## 2022-08-23 DIAGNOSIS — Z992 Dependence on renal dialysis: Secondary | ICD-10-CM | POA: Diagnosis not present

## 2022-08-24 ENCOUNTER — Encounter: Payer: Medicare HMO | Attending: Cardiology | Admitting: *Deleted

## 2022-08-24 DIAGNOSIS — I5020 Unspecified systolic (congestive) heart failure: Secondary | ICD-10-CM | POA: Diagnosis not present

## 2022-08-24 DIAGNOSIS — Z951 Presence of aortocoronary bypass graft: Secondary | ICD-10-CM | POA: Insufficient documentation

## 2022-08-24 DIAGNOSIS — Z1331 Encounter for screening for depression: Secondary | ICD-10-CM | POA: Diagnosis not present

## 2022-08-24 DIAGNOSIS — Z992 Dependence on renal dialysis: Secondary | ICD-10-CM | POA: Diagnosis not present

## 2022-08-24 DIAGNOSIS — I10 Essential (primary) hypertension: Secondary | ICD-10-CM | POA: Diagnosis not present

## 2022-08-24 DIAGNOSIS — E039 Hypothyroidism, unspecified: Secondary | ICD-10-CM | POA: Diagnosis not present

## 2022-08-24 DIAGNOSIS — Z0000 Encounter for general adult medical examination without abnormal findings: Secondary | ICD-10-CM | POA: Diagnosis not present

## 2022-08-24 DIAGNOSIS — I132 Hypertensive heart and chronic kidney disease with heart failure and with stage 5 chronic kidney disease, or end stage renal disease: Secondary | ICD-10-CM | POA: Diagnosis not present

## 2022-08-24 DIAGNOSIS — N186 End stage renal disease: Secondary | ICD-10-CM | POA: Diagnosis not present

## 2022-08-24 NOTE — Progress Notes (Signed)
Daily Session Note  Patient Details  Name: Joseph Mcpherson MRN: [id number] Date of Birth: Apr 02, 1946 Referring Provider:   Flowsheet Row Cardiac Rehab from 08/03/2022 in [facility] Cardiac and Pulmonary Rehab  Referring Provider Dr. Saralyn Pilar       Encounter Date: 08/24/2022  Check In:  Session Check In - 08/24/22 1113       Check-In   Supervising physician immediately available to respond to emergencies See telemetry face sheet for immediately available ER MD    Location [facility]-Cardiac & Pulmonary Rehab    Staff Present Darlyne Russian, RN, Doyce Para, BS, ACSM CEP, Exercise Physiologist;Meredith Sherryll Burger, RN BSN;Joseph Tessie Fass, RCP,RRT,BSRT    Virtual Visit No    Medication changes reported     No    Fall or balance concerns reported    No    Warm-up and Cool-down Performed on first and last piece of equipment    Resistance Training Performed Yes    VAD Patient? No    PAD/SET Patient? No      Pain Assessment   Currently in Pain? No/denies                Social History   Tobacco Use  Smoking Status Never  Smokeless Tobacco Former    Goals Met:  Independence with exercise equipment Exercise tolerated well No report of concerns or symptoms today Strength training completed today  Goals Unmet:  Not Applicable  Comments: Pt able to follow exercise prescription today without complaint.  Will continue to monitor for progression.    Dr. Emily Filbert is Medical Director for [place].  Dr. Ottie Glazier is Medical Director for [facility] Pulmonary Rehabilitation.

## 2022-08-25 DIAGNOSIS — Z992 Dependence on renal dialysis: Secondary | ICD-10-CM | POA: Diagnosis not present

## 2022-08-25 DIAGNOSIS — N2581 Secondary hyperparathyroidism of renal origin: Secondary | ICD-10-CM | POA: Diagnosis not present

## 2022-08-25 DIAGNOSIS — N186 End stage renal disease: Secondary | ICD-10-CM | POA: Diagnosis not present

## 2022-08-25 DIAGNOSIS — R8290 Unspecified abnormal findings in urine: Secondary | ICD-10-CM | POA: Diagnosis not present

## 2022-08-26 ENCOUNTER — Encounter: Payer: Medicare HMO | Admitting: *Deleted

## 2022-08-26 DIAGNOSIS — Z951 Presence of aortocoronary bypass graft: Secondary | ICD-10-CM

## 2022-08-26 NOTE — Progress Notes (Signed)
Daily Session Note  Patient Details  Name: Joseph Mcpherson MRN: [id number] Date of Birth: 04/16/1946 Referring Provider:   Flowsheet Row Cardiac Rehab from 08/03/2022 in [facility] Cardiac and Pulmonary Rehab  Referring Provider Dr. Saralyn Pilar       Encounter Date: 08/26/2022  Check In:  Session Check In - 08/26/22 1120       Check-In   Supervising physician immediately available to respond to emergencies See telemetry face sheet for immediately available ER MD    Location [facility]-Cardiac & Pulmonary Rehab    Staff Present Darlyne Russian, RN, ADN;Meredith Sherryll Burger, RN BSN;Joseph [place], RCP,RRT,BSRT;Noah Tickle, BS, Exercise Physiologist    Virtual Visit No    Medication changes reported     No    Fall or balance concerns reported    No    Warm-up and Cool-down Performed on first and last piece of equipment    Resistance Training Performed Yes    VAD Patient? No    PAD/SET Patient? No      Pain Assessment   Currently in Pain? No/denies                Social History   Tobacco Use  Smoking Status Never  Smokeless Tobacco Former    Goals Met:  Independence with exercise equipment Exercise tolerated well No report of concerns or symptoms today Strength training completed today  Goals Unmet:  Not Applicable  Comments: Pt able to follow exercise prescription today without complaint.  Will continue to monitor for progression.    Dr. Emily Filbert is Medical Director for [place].  Dr. Ottie Glazier is Medical Director for [facility] Pulmonary Rehabilitation.

## 2022-08-27 DIAGNOSIS — N186 End stage renal disease: Secondary | ICD-10-CM | POA: Diagnosis not present

## 2022-08-27 DIAGNOSIS — Z992 Dependence on renal dialysis: Secondary | ICD-10-CM | POA: Diagnosis not present

## 2022-08-27 DIAGNOSIS — N2581 Secondary hyperparathyroidism of renal origin: Secondary | ICD-10-CM | POA: Diagnosis not present

## 2022-08-28 DIAGNOSIS — Z951 Presence of aortocoronary bypass graft: Secondary | ICD-10-CM | POA: Diagnosis not present

## 2022-08-28 NOTE — Progress Notes (Signed)
Daily Session Note  Patient Details  Name: Joseph Mcpherson MRN: [id number] Date of Birth: [birth year] Referring Provider:   Flowsheet Row Cardiac Rehab from 08/03/2022 in [facility] Cardiac and Pulmonary Rehab  Referring Provider Dr. Darrold Junker       Encounter Date: 08/28/2022  Check In:  Session Check In - 08/28/22 1113       Check-In   Supervising physician immediately available to respond to emergencies See telemetry face sheet for immediately available ER MD    Location [facility]-Cardiac & Pulmonary Rehab    Staff Present Darcel [place], RN,BC,MSN;Joseph [place], RCP,RRT,BSRT;Noah [place], [place], Exercise Physiologist    Virtual Visit No    Medication changes reported     No    Fall or balance concerns reported    No    Tobacco Cessation No Change    Warm-up and Cool-down Performed on first and last piece of equipment    Resistance Training Performed No    VAD Patient? No    PAD/SET Patient? No      Pain Assessment   Currently in Pain? No/denies    Multiple Pain Sites No                Social History   Tobacco Use  Smoking Status Never  Smokeless Tobacco Former    Goals Met:  Independence with exercise equipment Exercise tolerated well No report of concerns or symptoms today  Goals Unmet:  Not Applicable  Comments: Pt able to follow exercise prescription today without complaint.  Will continue to monitor for progression.    Dr. Bethann Punches is Medical Director for [facility] Cardiac Rehabilitation.  Dr. Vida Rigger is Medical Director for [facility] Pulmonary Rehabilitation.

## 2022-08-28 NOTE — Progress Notes (Deleted)
Daily Session Note  Patient Details  Name: Wane Cerreta MRN: [id number] Date of Birth: 18-May-1946 Referring Provider:   Flowsheet Row Cardiac Rehab from 08/03/2022 in [facility] Cardiac and Pulmonary Rehab  Referring Provider Dr. Darrold Junker       Encounter Date: 08/28/2022  Check In:  Session Check In - 08/28/22 1113       Check-In   Supervising physician immediately available to respond to emergencies See telemetry face sheet for immediately available ER MD    Location [facility]-Cardiac & Pulmonary Rehab    Staff Present Darcel Bayley, RN,BC,MSN;Joseph [place], RCP,RRT,BSRT;Noah [place], [place], Exercise Physiologist    Virtual Visit No    Medication changes reported     No    Fall or balance concerns reported    No    Tobacco Cessation No Change    Warm-up and Cool-down Performed on first and last piece of equipment    Resistance Training Performed Yes    VAD Patient? No    PAD/SET Patient? No      Pain Assessment   Currently in Pain? No/denies    Multiple Pain Sites No                Social History   Tobacco Use  Smoking Status Never  Smokeless Tobacco Former    Goals Met:  Independence with exercise equipment Exercise tolerated well No report of concerns or symptoms today  Goals Unmet:  Not Applicable  Comments: Pt able to follow exercise prescription today without complaint.  Will continue to monitor for progression.    Dr. Bethann Punches is Medical Director for [facility] Cardiac Rehabilitation.  Dr. Vida Rigger is Medical Director for [facility] Pulmonary Rehabilitation.

## 2022-08-29 DIAGNOSIS — Z992 Dependence on renal dialysis: Secondary | ICD-10-CM | POA: Diagnosis not present

## 2022-08-29 DIAGNOSIS — N2581 Secondary hyperparathyroidism of renal origin: Secondary | ICD-10-CM | POA: Diagnosis not present

## 2022-08-29 DIAGNOSIS — N186 End stage renal disease: Secondary | ICD-10-CM | POA: Diagnosis not present

## 2022-08-31 ENCOUNTER — Encounter: Payer: Medicare HMO | Admitting: *Deleted

## 2022-08-31 DIAGNOSIS — Z951 Presence of aortocoronary bypass graft: Secondary | ICD-10-CM

## 2022-08-31 NOTE — Progress Notes (Signed)
Daily Session Note  Patient Details  Name: Joseph Mcpherson MRN: [id number] Date of Birth: 08/09/1945 Referring Provider:   Flowsheet Row Cardiac Rehab from 08/03/2022 in [facility] Cardiac and Pulmonary Rehab  Referring Provider Dr. Darrold Junker       Encounter Date: 08/31/2022  Check In:  Session Check In - 08/31/22 1105       Check-In   Supervising physician immediately available to respond to emergencies See telemetry face sheet for immediately available ER MD    Location [facility]-Cardiac & Pulmonary Rehab    Staff Present Lanny [place]st, RN, Franki Monte, BS, ACSM CEP, Exercise Physiologist;Noah Tickle, BS, Exercise Physiologist    Virtual Visit No    Medication changes reported     No    Fall or balance concerns reported    No    Warm-up and Cool-down Performed on first and last piece of equipment    Resistance Training Performed Yes    VAD Patient? No    PAD/SET Patient? No      Pain Assessment   Currently in Pain? No/denies                Social History   Tobacco Use  Smoking Status Never  Smokeless Tobacco Former    Goals Met:  Independence with exercise equipment Exercise tolerated well No report of concerns or symptoms today Strength training completed today  Goals Unmet:  Not Applicable  Comments: Pt able to follow exercise prescription today without complaint.  Will continue to monitor for progression.    Dr. Bethann Punches is Medical Director for [facility] Cardiac Rehabilitation.  Dr. Vida Rigger is Medical Director for [facility] Pulmonary Rehabilitation.

## 2022-09-01 DIAGNOSIS — N186 End stage renal disease: Secondary | ICD-10-CM | POA: Diagnosis not present

## 2022-09-01 DIAGNOSIS — Z992 Dependence on renal dialysis: Secondary | ICD-10-CM | POA: Diagnosis not present

## 2022-09-01 DIAGNOSIS — N2581 Secondary hyperparathyroidism of renal origin: Secondary | ICD-10-CM | POA: Diagnosis not present

## 2022-09-02 ENCOUNTER — Encounter: Payer: Medicare HMO | Admitting: *Deleted

## 2022-09-02 DIAGNOSIS — Z951 Presence of aortocoronary bypass graft: Secondary | ICD-10-CM | POA: Diagnosis not present

## 2022-09-02 NOTE — Progress Notes (Signed)
Daily Session Note  Patient Details  Name: Nyrell Kneeland MRN: [id number] Date of Birth: 14-Aug-1945 Referring Provider:   Flowsheet Row Cardiac Rehab from 08/03/2022 in [facility] Cardiac and Pulmonary Rehab  Referring Provider Dr. Darrold Junker       Encounter Date: 09/02/2022  Check In:  Session Check In - 09/02/22 1115       Check-In   Supervising physician immediately available to respond to emergencies See telemetry face sheet for immediately available ER MD    Location [facility]-Cardiac & Pulmonary Rehab    Staff Present Lanny Hurst, RN, ADN;Joseph [place], RCP,RRT,BSRT;Noah Tickle, BS, Exercise Physiologist    Virtual Visit No    Medication changes reported     No    Fall or balance concerns reported    No    Warm-up and Cool-down Performed on first and last piece of equipment    Resistance Training Performed Yes    VAD Patient? No    PAD/SET Patient? No      Pain Assessment   Currently in Pain? No/denies                Social History   Tobacco Use  Smoking Status Never  Smokeless Tobacco Former    Goals Met:  Independence with exercise equipment Exercise tolerated well No report of concerns or symptoms today Strength training completed today  Goals Unmet:  Not Applicable  Comments: Pt able to follow exercise prescription today without complaint.  Will continue to monitor for progression.    Dr. Bethann Punches is Medical Director for [facility] Cardiac Rehabilitation.  Dr. Vida Rigger is Medical Director for [facility] Pulmonary Rehabilitation.

## 2022-09-03 DIAGNOSIS — Z992 Dependence on renal dialysis: Secondary | ICD-10-CM | POA: Diagnosis not present

## 2022-09-03 DIAGNOSIS — N186 End stage renal disease: Secondary | ICD-10-CM | POA: Diagnosis not present

## 2022-09-03 DIAGNOSIS — N2581 Secondary hyperparathyroidism of renal origin: Secondary | ICD-10-CM | POA: Diagnosis not present

## 2022-09-04 ENCOUNTER — Encounter: Payer: Medicare HMO | Admitting: *Deleted

## 2022-09-04 DIAGNOSIS — Z951 Presence of aortocoronary bypass graft: Secondary | ICD-10-CM | POA: Diagnosis not present

## 2022-09-04 NOTE — Progress Notes (Signed)
Daily Session Note  Patient Details  Name: Capri Sadlier MRN: [id number] Date of Birth: [birth year] Referring Provider:   Flowsheet Row Cardiac Rehab from 08/03/2022 in [facility] Cardiac and Pulmonary Rehab  Referring Provider Dr. Darrold Junker       Encounter Date: 09/04/2022  Check In:  Session Check In - 09/04/22 1124       Check-In   Supervising physician immediately available to respond to emergencies See telemetry face sheet for immediately available ER MD    Location [facility]-Cardiac & Pulmonary Rehab    Staff Present Elige Ko, RCP,RRT,BSRT;Harlene Ramus, RN, BSN;Cherish Runde, MA, RCEP, CCRP, CCET    Virtual Visit No    Medication changes reported     No    Fall or balance concerns reported    No    Tobacco Cessation No Change    Warm-up and Cool-down Performed on first and last piece of equipment    Resistance Training Performed Yes    VAD Patient? No    PAD/SET Patient? No      Pain Assessment   Currently in Pain? No/denies                Social History   Tobacco Use  Smoking Status Never  Smokeless Tobacco Former    Goals Met:  Independence with exercise equipment Exercise tolerated well No report of concerns or symptoms today Strength training completed today  Goals Unmet:  Not Applicable  Comments: Pt able to follow exercise prescription today without complaint.  Will continue to monitor for progression. Reviewed home exercise with pt today.  Pt plans to walk in house at home with walker for exercise.  He was also encouraged to try chair exercises as well.  Reviewed THR, pulse, RPE, sign and symptoms, pulse oximetery and when to call 911 or MD.  Also discussed weather considerations and indoor options.  Pt voiced understanding.   Dr. Bethann Punches is Medical Director for [facility] Cardiac Rehabilitation.  Dr. Vida Rigger is Medical Director for [facility] Pulmonary Rehabilitation.

## 2022-09-05 DIAGNOSIS — N186 End stage renal disease: Secondary | ICD-10-CM | POA: Diagnosis not present

## 2022-09-05 DIAGNOSIS — N2581 Secondary hyperparathyroidism of renal origin: Secondary | ICD-10-CM | POA: Diagnosis not present

## 2022-09-05 DIAGNOSIS — Z992 Dependence on renal dialysis: Secondary | ICD-10-CM | POA: Diagnosis not present

## 2022-09-07 ENCOUNTER — Encounter: Payer: Medicare HMO | Admitting: *Deleted

## 2022-09-07 DIAGNOSIS — Z951 Presence of aortocoronary bypass graft: Secondary | ICD-10-CM

## 2022-09-07 NOTE — Progress Notes (Signed)
Daily Session Note  Patient Details  Name: Joseph Mcpherson MRN: [id number] Date of Birth: 02-May-1946 Referring Provider:   Flowsheet Row Cardiac Rehab from 08/03/2022 in [facility] Cardiac and Pulmonary Rehab  Referring Provider Dr. Darrold Junker       Encounter Date: 09/07/2022  Check In:  Session Check In - 09/07/22 1105       Check-In   Supervising physician immediately available to respond to emergencies See telemetry face sheet for immediately available ER MD    Location [facility]-Cardiac & Pulmonary Rehab    Staff Present Lanny Hurst, RN, Franki Monte, BS, ACSM CEP, Exercise Physiologist;Noah Tickle, BS, Exercise Physiologist;Meredith Jewel Baize, RN BSN    Virtual Visit No    Medication changes reported     No    Fall or balance concerns reported    No    Warm-up and Cool-down Performed on first and last piece of equipment    Resistance Training Performed Yes    VAD Patient? No    PAD/SET Patient? No      Pain Assessment   Currently in Pain? No/denies                Social History   Tobacco Use  Smoking Status Never  Smokeless Tobacco Former    Goals Met:  Independence with exercise equipment Exercise tolerated well No report of concerns or symptoms today Strength training completed today  Goals Unmet:  Not Applicable  Comments: Pt able to follow exercise prescription today without complaint.  Will continue to monitor for progression.    Dr. Bethann Punches is Medical Director for [facility] Cardiac Rehabilitation.  Dr. Vida Rigger is Medical Director for [facility] Pulmonary Rehabilitation.

## 2022-09-08 DIAGNOSIS — Z992 Dependence on renal dialysis: Secondary | ICD-10-CM | POA: Diagnosis not present

## 2022-09-08 DIAGNOSIS — N2581 Secondary hyperparathyroidism of renal origin: Secondary | ICD-10-CM | POA: Diagnosis not present

## 2022-09-08 DIAGNOSIS — N186 End stage renal disease: Secondary | ICD-10-CM | POA: Diagnosis not present

## 2022-09-09 ENCOUNTER — Encounter: Payer: Self-pay | Admitting: *Deleted

## 2022-09-09 ENCOUNTER — Encounter: Payer: Medicare HMO | Admitting: *Deleted

## 2022-09-09 DIAGNOSIS — Z951 Presence of aortocoronary bypass graft: Secondary | ICD-10-CM

## 2022-09-09 NOTE — Progress Notes (Signed)
Cardiac Individual Treatment Plan  Patient Details  Name: Joseph Mcpherson MRN: [id number] Date of Birth: 11/29/1945 Referring Provider:   Flowsheet Row Cardiac Rehab from 08/03/2022 in [facility] Cardiac and Pulmonary Rehab  Referring Provider Dr. Saralyn Pilar       Initial Encounter Date:  Flowsheet Row Cardiac Rehab from 08/03/2022 in [facility] Cardiac and Pulmonary Rehab  Date 08/03/22       Visit Diagnosis: S/P CABG x 4  Patient's Home Medications on Admission:  Current Outpatient Medications:    albuterol (PROVENTIL) (2.5 MG/3ML) 0.083% nebulizer solution, Take 3 mLs (2.5 mg total) by nebulization every 6 (six) hours as needed for wheezing or shortness of breath., Disp: 150 mL, Rfl: 1   amLODipine (NORVASC) 10 MG tablet, Take 1 tablet (10 mg total) by mouth daily., Disp: 30 tablet, Rfl: 3   aspirin EC 81 MG tablet, Take 81 mg by mouth daily. Swallow whole., Disp: , Rfl:    atorvastatin (LIPITOR) 40 MG tablet, Take 1 tablet (40 mg total) by mouth daily., Disp: 90 tablet, Rfl: 1   B Complex-C-Folic Acid (RENA-VITE RX) 1 MG TABS, Take 1 tablet by mouth daily. , Disp: , Rfl:    BD PEN NEEDLE NANO 2ND GEN 32G X 4 MM MISC, USE AS DIRECTED, Disp: 300 each, Rfl: 1   calcium acetate (PHOSLO) 667 MG capsule, TAKE 2 CAPSULES BY MOUTH 3 TIMES A DAY WITH MEAL, Disp: 540 capsule, Rfl: 1   Cholecalciferol (VITAMIN D3) 5000 units TABS, Take 5,000 Units by mouth daily. , Disp: , Rfl:    clopidogrel (PLAVIX) 75 MG tablet, Take 1 tablet (75 mg total) by mouth daily. (Patient not taking: Reported on 07/27/2022), Disp: 90 tablet, Rfl: 1   Continuous Blood Gluc Sensor (FREESTYLE LIBRE 14 DAY SENSOR) MISC, APPLY EVERY 14 (FOURTEEN) DAYS., Disp: 90 each, Rfl: 1   donepezil (ARICEPT) 10 MG tablet, TAKE 1 TABLET BY MOUTH EVERY DAY, Disp: 90 tablet, Rfl: 3   hydrALAZINE (APRESOLINE) 25 MG tablet, Take 25 mg by mouth 2 (two) times daily. (Patient not taking: Reported on 06/03/2022), Disp: , Rfl:    insulin detemir (LEVEMIR) 100  UNIT/ML injection, Inject 0.15 mLs (15 Units total) into the skin at bedtime. (Patient taking differently: Inject 14 Units into the skin at bedtime.), Disp: 10 mL, Rfl: 11   ipratropium-albuterol (DUONEB) 0.5-2.5 (3) MG/3ML SOLN, Take 3 mLs by nebulization every 6 (six) hours as needed., Disp: 120 mL, Rfl: 4   levETIRAcetam (KEPPRA) 250 MG tablet, Take 1 tablet (250 mg total) by mouth 3 (three) times a week. On Tuesday ,Thursday and Saturday after Hemodialysis (Patient not taking: Reported on 07/27/2022), Disp: 30 tablet, Rfl: 0   levETIRAcetam (KEPPRA) 500 MG tablet, Take 1 tablet (500 mg total) by mouth daily. (Patient not taking: Reported on 07/27/2022), Disp: 30 tablet, Rfl: 1   levothyroxine (SYNTHROID) 137 MCG tablet, TAKE 1 TABLET BY MOUTH EVERY DAY, Disp: 90 tablet, Rfl: 3   losartan (COZAAR) 100 MG tablet, Take 1 tablet (100 mg total) by mouth every evening., Disp: 30 tablet, Rfl: 3   metoprolol succinate (TOPROL-XL) 100 MG 24 hr tablet, Take 1 tablet (100 mg total) by mouth daily. Take with or immediately following a meal. (Patient not taking: Reported on 07/27/2022), Disp: , Rfl:    multivitamin (RENA-VIT) TABS tablet, Take 1 tablet by mouth daily., Disp: , Rfl: 0   pantoprazole (PROTONIX) 40 MG tablet, TAKE 1 TABLET BY MOUTH EVERY DAY, Disp: 90 tablet, Rfl: 3   sevelamer  carbonate (RENVELA) 2.4 g PACK, Take 2.4 g by mouth daily., Disp: 90 each, Rfl: 3   sitaGLIPtin (JANUVIA) 50 MG tablet, Take 50 mg by mouth every evening.  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 II   Diabetic eye exam ([place]) 12/2019   ESRD (end stage renal disease) ([place])    MWF- [facility]   GERD (gastroesophageal reflux disease)    [name] (hard of hearing)    Hyperlipidemia    Hypertension    Hypothyroidism     Thyroid disease     Tobacco Use: Social History   Tobacco Use  Smoking Status Never  Smokeless Tobacco Former    Labs: Review Flowsheet  More data exists      Latest Ref Rng & Units 02/12/2021 01/18/2022 04/11/2022 04/13/2022 04/15/2022  Labs for ITP Cardiac and Pulmonary Rehab  Cholestrol 0 - 200 mg/dL - - - - 120   LDL (calc) 0 - 99 mg/dL - - - - 66   HDL-C >40 mg/dL - - - - 42   Trlycerides <150 mg/dL - - - - 60   Hemoglobin A1c 4.8 - 5.6 % 7.0  8.1  5.9  - -  Bicarbonate 20.0 - 28.0 mmol/L - - - 28.5  -  O2 Saturation % - - - 92.2  -     Exercise Target Goals: Exercise Program Goal: Individual exercise prescription set using results from initial 6 min walk test and THRR while considering  patient's activity barriers and safety.   Exercise Prescription Goal: Initial exercise prescription builds to 30-45 minutes a day of aerobic activity, 2-3 days per week.  Home exercise guidelines will be given to patient during program as part of exercise prescription that the participant will acknowledge.   Education: Aerobic Exercise: - Group verbal and visual presentation on the components of exercise prescription. Introduces F.I.T.T principle from ACSM for exercise prescriptions.  Reviews F.I.T.T. principles of aerobic exercise including progression. Written material given at graduation.   Education: Resistance Exercise: - Group verbal and visual presentation on the components of exercise prescription. Introduces F.I.T.T principle from ACSM for exercise prescriptions  Reviews F.I.T.T. principles of resistance exercise including progression. Written material given at graduation.    Education: Exercise & Equipment Safety: - Individual verbal instruction and demonstration of equipment use and safety with use of the equipment. Flowsheet Row Cardiac Rehab from 08/03/2022 in [facility] Cardiac and Pulmonary Rehab  Date 08/03/22  Educator [facility]  Instruction Review Code 1- Verbalizes  Understanding       Education: Exercise Physiology & General Exercise Guidelines: - Group verbal and written instruction with models to review the exercise physiology of the cardiovascular system and associated critical values. Provides general exercise guidelines with specific guidelines to those with heart or lung disease.    Education: Flexibility, Balance, Mind/Body Relaxation: - Group verbal and visual presentation with interactive activity on the components of exercise prescription. Introduces F.I.T.T principle from ACSM for exercise prescriptions. Reviews F.I.T.T. principles of flexibility and balance exercise training including progression. Also discusses the mind body connection.  Reviews various relaxation techniques to help reduce and manage stress (i.e. Deep breathing, progressive muscle relaxation, and visualization). Balance handout provided to take home. Written material given at graduation.   Activity Barriers & Risk Stratification:  Activity Barriers & Cardiac Risk Stratification - 08/03/22 1700       Activity Barriers & Cardiac Risk Stratification   Activity Barriers None;Assistive Device    Cardiac Risk Stratification High             6 Minute Walk:  6 Minute Walk     Row Name 08/03/22 1657         6 Minute Walk   Phase Initial     Distance 480 feet     Walk Time 4 minutes     # of Rest Breaks 1  stopped test at 4 min mark due to being tired.     MPH 1.36     METS 1.04     RPE 13     Perceived Dyspnea  0     VO2 Peak 3.36     Symptoms No     Resting HR 60 bpm     Resting BP 110/70     Resting Oxygen Saturation  99 %     Exercise Oxygen Saturation  during 6 min walk 100 %     Max Ex. HR 80 bpm     Max Ex. BP 128/62     2 Minute Post BP 118/62              Oxygen Initial Assessment:   Oxygen Re-Evaluation:   Oxygen Discharge (Final Oxygen Re-Evaluation):   Initial Exercise Prescription:  Initial Exercise Prescription - 08/03/22  1700       Date of Initial Exercise RX and Referring Provider   Date 08/03/22    Referring Provider Dr. Darrold Junker      Oxygen   Maintain Oxygen Saturation 88% or higher      Recumbant Bike   Level 1    RPM 50    Watts 10    Minutes 15      NuStep   Level 1    SPM 80    Minutes 15    METs 1.04      T5 Nustep   Level 1    SPM 80    Minutes 15    METs 1.04      Track   Laps 5    Minutes 15    METs 1.27      Prescription Details   Frequency (times per week) 3    Duration Progress to 30 minutes of continuous aerobic without signs/symptoms of physical distress      Intensity   THRR 40-80% of Max Heartrate 93-127    Ratings of Perceived Exertion 11-13    Perceived Dyspnea 0-4      Progression   Progression Continue to progress workloads to maintain intensity without signs/symptoms of physical distress.      Resistance Training   Training Prescription Yes    Weight 2    Reps 10-15             Perform Capillary Blood Glucose checks as needed.  Exercise Prescription Changes:   Exercise Prescription Changes     Row Name 08/03/22 1700 08/11/22 1500 08/26/22 1300 09/04/22 1100 09/09/22 1300     Response to Exercise   Blood Pressure (Admit) 110/70 108/62 120/68 -- 126/74   Blood Pressure (Exercise) 128/62 124/62 112/58 -- 142/72   Blood Pressure (Exit) 118/62 110/60 110/58 -- 100/56   Heart Rate (Admit) 60 bpm 73 bpm 66 bpm -- 66 bpm   Heart Rate (Exercise) 80 bpm 78 bpm 73 bpm -- 83  bpm   Heart Rate (Exit) 60 bpm 61 bpm 63 bpm -- 65 bpm   Oxygen Saturation (Admit) 99 % -- -- -- --   Oxygen Saturation (Exercise) 100 % -- -- -- --   Oxygen Saturation (Exit) 100 % -- -- -- --   Rating of Perceived Exertion (Exercise) 13 13 13  -- 13   Perceived Dyspnea (Exercise) 0 -- -- -- --   Symptoms none none none -- none   Comments 6 MWT results First two days of exercise -- -- --   Duration -- Progress to 30 minutes of  aerobic without signs/symptoms of physical  distress Progress to 30 minutes of  aerobic without signs/symptoms of physical distress -- Progress to 30 minutes of  aerobic without signs/symptoms of physical distress   Intensity -- THRR unchanged THRR unchanged -- THRR unchanged     Progression   Progression -- Continue to progress workloads to maintain intensity without signs/symptoms of physical distress. Continue to progress workloads to maintain intensity without signs/symptoms of physical distress. -- Continue to progress workloads to maintain intensity without signs/symptoms of physical distress.   Average METs -- 1.23 1.15 -- 1.39     Resistance Training   Training Prescription -- Yes Yes -- Yes   Weight -- 2 lb 2 lb -- 2 lb   Reps -- 10-15 10-15 -- 10-15     Interval Training   Interval Training -- No No -- No     Treadmill   MPH -- -- -- -- 1   Grade -- -- -- -- 0   Minutes -- -- -- -- 15   METs -- -- -- -- 15     NuStep   Level -- 1 2 -- 3   Minutes -- 15 15 -- 15   METs -- 1.3 1.1 -- 2     Biostep-RELP   Level -- -- 1 -- 1   Minutes -- -- 15 -- 15   METs -- -- 1 -- 1     Track   Laps -- 3 4  hallway -- 2  hallway   Minutes -- 15 15 -- 15   METs -- 1.16 1.2 -- 1.1     Home Exercise Plan   Plans to continue exercise at -- -- -- Home (comment)  walking, chair exercises Home (comment)  walking, chair exercises   Frequency -- -- -- Add 2 additional days to program exercise sessions. Add 2 additional days to program exercise sessions.   Initial Home Exercises Provided -- -- -- 09/04/22 09/04/22     Oxygen   Maintain Oxygen Saturation -- 88% or higher 88% or higher -- 88% or higher            Exercise Comments:   Exercise Comments     Row Name 08/05/22 1541           Exercise Comments First full day of exercise!  Patient was oriented to gym and equipment including functions, settings, policies, and procedures.  Patient's individual exercise prescription and treatment plan were reviewed.  All starting  workloads were established based on the results of the 6 minute walk test done at initial orientation visit.  The plan for exercise progression was also introduced and progression will be customized based on patient's performance and goals.'                Exercise Goals and Review:   Exercise Goals     Row Name 08/03/22 1704  Exercise Goals   Increase Physical Activity Yes       Intervention Provide advice, education, support and counseling about physical activity/exercise needs.;Develop an individualized exercise prescription for aerobic and resistive training based on initial evaluation findings, risk stratification, comorbidities and participant's personal goals.       Expected Outcomes Short Term: Attend rehab on a regular basis to increase amount of physical activity.;Long Term: Add in home exercise to make exercise part of routine and to increase amount of physical activity.;Long Term: Exercising regularly at least 3-5 days a week.       Increase Strength and Stamina Yes       Intervention Provide advice, education, support and counseling about physical activity/exercise needs.;Develop an individualized exercise prescription for aerobic and resistive training based on initial evaluation findings, risk stratification, comorbidities and participant's personal goals.       Expected Outcomes Short Term: Increase workloads from initial exercise prescription for resistance, speed, and METs.;Short Term: Perform resistance training exercises routinely during rehab and add in resistance training at home;Long Term: Improve cardiorespiratory fitness, muscular endurance and strength as measured by increased METs and functional capacity ( )       Able to understand and use rate of perceived exertion (RPE) scale Yes       Intervention Provide education and explanation on how to use RPE scale       Expected Outcomes Short Term: Able to use RPE daily in rehab to express subjective  intensity level;Long Term:  Able to use RPE to guide intensity level when exercising independently       Able to understand and use Dyspnea scale Yes       Intervention Provide education and explanation on how to use Dyspnea scale       Expected Outcomes Short Term: Able to use Dyspnea scale daily in rehab to express subjective sense of shortness of breath during exertion;Long Term: Able to use Dyspnea scale to guide intensity level when exercising independently       Knowledge and understanding of Target Heart Rate Range (THRR) Yes       Intervention Provide education and explanation of THRR including how the numbers were predicted and where they are located for reference       Expected Outcomes Short Term: Able to state/look up THRR;Long Term: Able to use THRR to govern intensity when exercising independently;Short Term: Able to use daily as guideline for intensity in rehab       Able to check pulse independently Yes       Intervention Provide education and demonstration on how to check pulse in carotid and radial arteries.;Review the importance of being able to check your own pulse for safety during independent exercise       Expected Outcomes Short Term: Able to explain why pulse checking is important during independent exercise       Understanding of Exercise Prescription Yes       Intervention Provide education, explanation, and written materials on patient's individual exercise prescription       Expected Outcomes Short Term: Able to explain program exercise prescription;Long Term: Able to explain home exercise prescription to exercise independently                Exercise Goals Re-Evaluation :  Exercise Goals Re-Evaluation     Row Name 08/05/22 1541 08/11/22 1526 08/26/22 1339 09/02/22 1131 09/04/22 1130     Exercise Goal Re-Evaluation   Exercise Goals Review Increase Physical Activity;Able to understand and  use rate of perceived exertion (RPE) scale;Knowledge and understanding of  Target Heart Rate Range (THRR);Understanding of Exercise Prescription;Increase Strength and Stamina;Able to check pulse independently Increase Physical Activity;Increase Strength and Stamina;Understanding of Exercise Prescription Increase Physical Activity;Increase Strength and Stamina;Understanding of Exercise Prescription Increase Physical Activity Increase Physical Activity;Increase Strength and Stamina;Able to understand and use rate of perceived exertion (RPE) scale;Able to understand and use Dyspnea scale;Knowledge and understanding of Target Heart Rate Range (THRR);Able to check pulse independently;Understanding of Exercise Prescription   Comments Reviewed RPE scale, THR and program prescription with pt today.  Pt voiced understanding and was given a copy of goals to take home. Malacai is off to a good start in the program. During his first two sessions of rehab he had an average MET level of 1.23 METs. He also was able to walk 3 laps on the track, and did well with level 1 on the T4 nustep. He tolerated using 2 lb hand weights for resistance training as well. We will continue to monitor his progress in the program. Ociel continues to come to rehab and exercises at his initial exercise prescription. He did, however, increase to level 2 on the T4 Nustep. He paces himself when walking and averages 2-4 laps each time. He is not quite hitting his THR but should be a higher increase when workloads are increased. His RPEs are staying appropriate. We will continue to monitor. Timonthy is showing interest in trying the treadmill and is open to increasing his levels. His tolerance is increasing. His sleep schedule limits his energy but he is willing to try to think about how he can increase his activity level at home Reviewed home exercise with pt today.  Pt plans to walk in house at home with walker for exercise.  He was also encouraged to try chair exercises as well.  Reviewed THR, pulse, RPE, sign and symptoms, pulse  oximetery and when to call 911 or MD.  Also discussed weather considerations and indoor options.  Pt voiced understanding.   Expected Outcomes Short: Use RPE daily to regulate intensity.  Long: Follow program prescription in THR. Short: Continue to follow current exercise prescription. Long: Continue to improve strength and stamina. Short: Increase laps on track by 1 extra Long: Continue to increase overall MET level and stamina Short: try the treadmill in class with supervision. Long: develop an exercise routine that works with his schedule Short: Start to add in walking at home Long: Conitnue to improve stamina    Row Name 09/09/22 1325             Exercise Goal Re-Evaluation   Exercise Goals Review Increase Physical Activity;Increase Strength and Stamina;Understanding of Exercise Prescription       Comments Lendon is doing well in the program. He recently tried using the treadmill for the first time and was able to walk at a speed of 1 mph with no incline for around 3 minutes. He also improved from level 2 to level 3 on the T4. He has continued to use 2 lb hand weights for resistance training also. We will continue to monitor his progress in the program.       Expected Outcomes Short: Continue to build up stamina with walking. Long: Continue to improve strength and stamina.                Discharge Exercise Prescription (Final Exercise Prescription Changes):  Exercise Prescription Changes - 09/09/22 1300       Response to Exercise  Blood Pressure (Admit) 126/74    Blood Pressure (Exercise) 142/72    Blood Pressure (Exit) 100/56    Heart Rate (Admit) 66 bpm    Heart Rate (Exercise) 83 bpm    Heart Rate (Exit) 65 bpm    Rating of Perceived Exertion (Exercise) 13    Symptoms none    Duration Progress to 30 minutes of  aerobic without signs/symptoms of physical distress    Intensity THRR unchanged      Progression   Progression Continue to progress workloads to maintain intensity  without signs/symptoms of physical distress.    Average METs 1.39      Resistance Training   Training Prescription Yes    Weight 2 lb    Reps 10-15      Interval Training   Interval Training No      Treadmill   MPH 1    Grade 0    Minutes 15    METs 15      NuStep   Level 3    Minutes 15    METs 2      Biostep-RELP   Level 1    Minutes 15    METs 1      Track   Laps 2   hallway   Minutes 15    METs 1.1      Home Exercise Plan   Plans to continue exercise at Home (comment)   walking, chair exercises   Frequency Add 2 additional days to program exercise sessions.    Initial Home Exercises Provided 09/04/22      Oxygen   Maintain Oxygen Saturation 88% or higher             Nutrition:  Target Goals: Understanding of nutrition guidelines, daily intake of sodium 1500mg , cholesterol 200mg , calories 30% from fat and 7% or less from saturated fats, daily to have 5 or more servings of fruits and vegetables.  Education: All About Nutrition: -Group instruction provided by verbal, written material, interactive activities, discussions, models, and posters to present general guidelines for heart healthy nutrition including fat, fiber, MyPlate, the role of sodium in heart healthy nutrition, utilization of the nutrition label, and utilization of this knowledge for meal planning. Follow up email sent as well. Written material given at graduation.   Biometrics:  Pre Biometrics - 08/03/22 1705       Pre Biometrics   Waist Circumference --   deferred   Hip Circumference --   deferred             Nutrition Therapy Plan and Nutrition Goals:  Nutrition Therapy & Goals - 09/07/22 1107       Nutrition Therapy   Diet Heart healthy, low Na, T2Dm and dialysis MNT    Drug/Food Interactions Statins/Certain Fruits    Protein (specify units) 75g    Fiber 30 grams    Whole Grain Foods 3 servings    Saturated Fats 20 max. grams    Fruits and Vegetables 8 servings/day     Sodium 2 grams      Personal Nutrition Goals   Nutrition Goal ST: continue to follow recommendations of renal dietitian at HD LT: meet protein and calorie needs, maintain A1C <7    Comments 77 y.o. M admitted to cardiac rehab s/p CABG x 4.  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 Medications reviewed atorvastatin, vitamin D3, vitamin B12, novolog, levemir, synthroid, pantoprazole, miralax,  rena-vite, phoslo (calcium acetate). Labs reviewed. Imari and his wife came into see RD, however, Levis did not speak much during consultation. His wife reports speaking with MD and dietitian at HD clinic regarding [facility]. Jyair's wife reports that he will normally eat 2 meals/day - she reports he does not eat much meat, but will eat eggs and yogurt; she will normally cook him a meal in the evening or he would like to take in food. His wife reports that he is no longer taking the phosphate binder with meals and has been told to monitor his potassium; discussed the importance of including fiber rich foods during the day for heart health while following guidelines set by renal dietitian based off of his lab results. She reports that he does not eat much usually and has been eating this way for a long time - she reports his weight is stable. His wife also reports his BG doing well overall and that he does not have many BG lows anymore. Discussed the importance eating enough protein and calories during the day. Patient and wife report having no questions at this time.      Intervention Plan   Intervention Prescribe, educate and counsel regarding individualized specific dietary modifications aiming towards targeted core components such as weight, hypertension, lipid management, diabetes, heart failure and other comorbidities.    Expected Outcomes Short Term Goal: Understand basic principles of dietary content, such as calories, fat,  sodium, cholesterol and nutrients.;Short Term Goal: A plan has been developed with personal nutrition goals set during dietitian appointment.;Long Term Goal: Adherence to prescribed nutrition plan.             Nutrition Assessments:  MEDIFICTS Score Key: ?70 Need to make dietary changes  40-70 Heart Healthy Diet ? 40 Therapeutic Level Cholesterol Diet  Flowsheet Row Cardiac Rehab from 09/07/2022 in [facility] Cardiac and Pulmonary Rehab  Picture Your Plate Total Score on Admission 44      Picture Your Plate Scores: <40 Unhealthy dietary pattern with much room for improvement. 41-50 Dietary pattern unlikely to meet recommendations for good health and room for improvement. 51-60 More healthful dietary pattern, with some room for improvement.  >60 Healthy dietary pattern, although there may be some specific behaviors that could be improved.    Nutrition Goals Re-Evaluation:  Nutrition Goals Re-Evaluation     Row Name 09/02/22 1128             Goals   Comment Orby wants his wife to meet with the dietician, but scheduling with her has been difficult. He does talk with his dialysis staff about his renal diet. He mentions he also is aware of what foods affect his diabetes.       Expected Outcome Short: meet with dietician if schedules allow; Long: independently manage renal diet.                Nutrition Goals Discharge (Final Nutrition Goals Re-Evaluation):  Nutrition Goals Re-Evaluation - 09/02/22 1128       Goals   Comment Christophere wants his wife to meet with the dietician, but scheduling with her has been difficult. He does talk with his dialysis staff about his renal diet. He mentions he also is aware of what foods affect his diabetes.    Expected Outcome Short: meet with dietician if schedules allow; Long: independently manage renal diet.             Psychosocial: Target Goals: Acknowledge presence or absence of significant depression and/or  stress, maximize coping  skills, provide positive support system. Participant is able to verbalize types and ability to use techniques and skills needed for reducing stress and depression.   Education: Stress, Anxiety, and Depression - Group verbal and visual presentation to define topics covered.  Reviews how body is impacted by stress, anxiety, and depression.  Also discusses healthy ways to reduce stress and to treat/manage anxiety and depression.  Written material given at graduation.   Education: Sleep Hygiene -Provides group verbal and written instruction about how sleep can affect your health.  Define sleep hygiene, discuss sleep cycles and impact of sleep habits. Review good sleep hygiene tips.    Initial Review & Psychosocial Screening:  Initial Psych Review & Screening - 07/27/22 1553       Initial Review   Current issues with None Identified      Family Dynamics   Good Support System? Yes   Wife and Son            Quality of Life Scores:   Quality of Life - 08/03/22 1709       Quality of Life   Select --   deferred, patient unable to complete QOL questionaire.           Scores of 19 and below usually indicate a poorer quality of life in these areas.  A difference of  2-3 points is a clinically meaningful difference.  A difference of 2-3 points in the total score of the Quality of Life Index has been associated with significant improvement in overall quality of life, self-image, physical symptoms, and general health in studies assessing change in quality of life.  PHQ-9: Review Flowsheet       06/19/2021 03/12/2020  Depression screen PHQ 2/9  Decreased Interest 0 0  Down, Depressed, Hopeless 0 0  PHQ - 2 Score 0 0   Interpretation of Total Score  Total Score Depression Severity:  1-4 = Minimal depression, 5-9 = Mild depression, 10-14 = Moderate depression, 15-19 = Moderately severe depression, 20-27 = Severe depression   Psychosocial Evaluation and Intervention:  Psychosocial  Evaluation - 07/27/22 1605       Psychosocial Evaluation & Interventions   Interventions Encouraged to exercise with the program and follow exercise prescription;Relaxation education;Stress management education    Comments 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 He has no barriers for attending the program. He lives with his wife and has good support from his son "Willeen Cass" also who was on the phone call today. Son states he would like his father to have encourangement to complete program to obtain a more active lifestyle after bypass surgery, to increase strength, move around more independantly and potentially to be able to drive again. Carsen does use a walker occasionally with ambulation. 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    Expected Outcomes Short: Attend cardiac rehab for education and exercise. Long:  Develop and maintain positive self care habits    Continue Psychosocial Services  Follow up required by staff             Psychosocial Re-Evaluation:  Psychosocial Re-Evaluation     Row Name 09/02/22 1124             Psychosocial Re-Evaluation   Current issues with Current Sleep Concerns       Comments Wray's sleep schedule has been a stressor. He has a very hard time falling asleep  until early morning. Coming to class at 37 has kept him on a regular wake up schedule, but he is still struggling with falling asleep. He looks forward to his naps in the afternoon. He mentions his wife is a big help at home. He doesn't always feel up for coming to class, but his wife and he knows its for the best. It is hard to manage dialysis schedule on top of class schedule, leaving him with only Sunday free. He knows this is temporary and he wants to see it through.       Expected Outcomes Short: attend class consistenlty to maintain a schedule; limit sleep distractions at night. Long: maintain a positive sleep  schedule.       Interventions Encouraged to attend Cardiac Rehabilitation for the exercise       Continue Psychosocial Services  Follow up required by staff                Psychosocial Discharge (Final Psychosocial Re-Evaluation):  Psychosocial Re-Evaluation - 09/02/22 1124       Psychosocial Re-Evaluation   Current issues with Current Sleep Concerns    Comments Seymore's sleep schedule has been a stressor. He has a very hard time falling asleep until early morning. Coming to class at 15 has kept him on a regular wake up schedule, but he is still struggling with falling asleep. He looks forward to his naps in the afternoon. He mentions his wife is a big help at home. He doesn't always feel up for coming to class, but his wife and he knows its for the best. It is hard to manage dialysis schedule on top of class schedule, leaving him with only Sunday free. He knows this is temporary and he wants to see it through.    Expected Outcomes Short: attend class consistenlty to maintain a schedule; limit sleep distractions at night. Long: maintain a positive sleep schedule.    Interventions Encouraged to attend Cardiac Rehabilitation for the exercise    Continue Psychosocial Services  Follow up required by staff             Vocational Rehabilitation: Provide vocational rehab assistance to qualifying candidates.   Vocational Rehab Evaluation & Intervention:  Vocational Rehab - 07/27/22 1605       Initial Vocational Rehab Evaluation & Intervention   Assessment shows need for Vocational Rehabilitation No             Education: Education Goals: Education classes will be provided on a variety of topics geared toward better understanding of heart health and risk factor modification. Participant will state understanding/return demonstration of topics presented as noted by education test scores.  Learning Barriers/Preferences:  Learning Barriers/Preferences - 07/27/22 1605        Learning Barriers/Preferences   Learning Barriers None    Learning Preferences None             General Cardiac Education Topics:  AED/CPR: - Group verbal and written instruction with the use of models to demonstrate the basic use of the AED with the basic ABC's of resuscitation.   Anatomy and Cardiac Procedures: - Group verbal and visual presentation and models provide information about basic cardiac anatomy and function. Reviews the testing methods done to diagnose heart disease and the outcomes of the test results. Describes the treatment choices: Medical Management, Angioplasty, or Coronary Bypass Surgery for treating various heart conditions including Myocardial Infarction, Angina, Valve Disease, and Cardiac Arrhythmias.  Written material given at graduation.  Medication Safety: - Group verbal and visual instruction to review commonly prescribed medications for heart and lung disease. Reviews the medication, class of the drug, and side effects. Includes the steps to properly store meds and maintain the prescription regimen.  Written material given at graduation.   Intimacy: - Group verbal instruction through game format to discuss how heart and lung disease can affect sexual intimacy. Written material given at graduation..   Know Your Numbers and Heart Failure: - Group verbal and visual instruction to discuss disease risk factors for cardiac and pulmonary disease and treatment options.  Reviews associated critical values for Overweight/Obesity, Hypertension, Cholesterol, and Diabetes.  Discusses basics of heart failure: signs/symptoms and treatments.  Introduces Heart Failure Zone chart for action plan for heart failure.  Written material given at graduation.   Infection Prevention: - Provides verbal and written material to individual with discussion of infection control including proper hand washing and proper equipment cleaning during exercise session. Flowsheet Row Cardiac  Rehab from 08/03/2022 in [facility] Cardiac and Pulmonary Rehab  Date 08/03/22  Educator [facility]  Instruction Review Code 1- Verbalizes Understanding       Falls Prevention: - Provides verbal and written material to individual with discussion of falls prevention and safety. Flowsheet Row Cardiac Rehab from 08/03/2022 in [facility] Cardiac and Pulmonary Rehab  Date 08/03/22  Educator [facility]  Instruction Review Code 1- Verbalizes Understanding       Other: -Provides group and verbal instruction on various topics (see comments)   Knowledge Questionnaire Score:  Knowledge Questionnaire Score - 08/03/22 1709       Knowledge Questionnaire Score   Pre Score deferred, Patient unable to complete knowledge questionaire.             Core Components/Risk Factors/Patient Goals at Admission:  Personal Goals and Risk Factors at Admission - 08/03/22 1710       Core Components/Risk Factors/Patient Goals on Admission    Weight Management Yes    Intervention Weight Management: Develop a combined nutrition and exercise program designed to reach desired caloric intake, while maintaining appropriate intake of nutrient and fiber, sodium and fats, and appropriate energy expenditure required for the weight goal.;Weight Management: Provide education and appropriate resources to help participant work on and attain dietary goals.;Weight Management/Obesity: Establish reasonable short term and long term weight goals.    Admit Weight 142 lb (64.4 kg)    Goal Weight: Short Term 140 lb (63.5 kg)    Goal Weight: Long Term 140 lb (63.5 kg)    Expected Outcomes Short Term: Continue to assess and modify interventions until short term weight is achieved;Weight Maintenance: Understanding of the daily nutrition guidelines, which includes 25-35% calories from fat, 7% or less cal from saturated fats, less than 200mg  cholesterol, less than 1.5gm of sodium, & 5 or more servings of fruits and vegetables daily;Understanding recommendations  for meals to include 15-35% energy as protein, 25-35% energy from fat, 35-60% energy from carbohydrates, less than 200mg  of dietary cholesterol, 20-35 gm of total fiber daily;Understanding of distribution of calorie intake throughout the day with the consumption of 4-5 meals/snacks;Long Term: Adherence to nutrition and physical activity/exercise program aimed toward attainment of established weight goal    Diabetes Yes    Intervention Provide education about signs/symptoms and action to take for hypo/hyperglycemia.;Provide education about proper nutrition, including hydration, and aerobic/resistive exercise prescription along with prescribed medications to achieve blood glucose in normal ranges: Fasting glucose 65-99 mg/dL    Expected Outcomes Short Term: Participant verbalizes  understanding of the signs/symptoms and immediate care of hyper/hypoglycemia, proper foot care and importance of medication, aerobic/resistive exercise and nutrition plan for blood glucose control.;Long Term: Attainment of HbA1C < 7%.    Heart Failure Yes    Intervention Provide a combined exercise and nutrition program that is supplemented with education, support and counseling about heart failure. Directed toward relieving symptoms such as shortness of breath, decreased exercise tolerance, and extremity edema.    Expected Outcomes Improve functional capacity of life;Short term: Attendance in program 2-3 days a week with increased exercise capacity. Reported lower sodium intake. Reported increased fruit and vegetable intake. Reports medication compliance.;Short term: Daily weights obtained and reported for increase. Utilizing diuretic protocols set by physician.;Long term: Adoption of self-care skills and reduction of barriers for early signs and symptoms recognition and intervention leading to self-care maintenance.    Hypertension Yes    Intervention Provide education on lifestyle modifcations including regular physical  activity/exercise, weight management, moderate sodium restriction and increased consumption of fresh fruit, vegetables, and low fat dairy, alcohol moderation, and smoking cessation.;Monitor prescription use compliance.    Expected Outcomes Short Term: Continued assessment and intervention until BP is < 140/39mm HG in hypertensive participants. < 130/36mm HG in hypertensive participants with diabetes, heart failure or chronic kidney disease.;Long Term: Maintenance of blood pressure at goal levels.    Lipids Yes    Intervention Provide education and support for participant on nutrition & aerobic/resistive exercise along with prescribed medications to achieve LDL 70mg , HDL >40mg .    Expected Outcomes Short Term: Participant states understanding of desired cholesterol values and is compliant with medications prescribed. Participant is following exercise prescription and nutrition guidelines.;Long Term: Cholesterol controlled with medications as prescribed, with individualized exercise RX and with personalized nutrition plan. Value goals: LDL < 70mg , HDL > 40 mg.             Education:Diabetes - Individual verbal and written instruction to review signs/symptoms of diabetes, desired ranges of glucose level fasting, after meals and with exercise. Acknowledge that pre and post exercise glucose checks will be done for 3 sessions at entry of program. Flowsheet Row Cardiac Rehab from 08/03/2022 in [facility] Cardiac and Pulmonary Rehab  Date 08/03/22  Educator [facility]  Instruction Review Code 1- Verbalizes Understanding       Core Components/Risk Factors/Patient Goals Review:   Goals and Risk Factor Review     Row Name 09/02/22 1118             Core Components/Risk Factors/Patient Goals Review   Personal Goals Review Diabetes;Heart Failure;Weight Management/Obesity       Review Karanveer has been checking his sugar in the morning and reports it is going well most days. He is attending dialysis regularly and  is working on managing his heart failure symptoms. He has noticed a slight increase in stamina and wants to work on his strength. He has met his weight goal and wants to maintain his weight which he knows he needs to manage his fluid balance.       Expected Outcomes Short: maintain weight by managing fluid intake and eating consistenly. Long:attend the program consistenly to help manage his heart failure and diabetes.                Core Components/Risk Factors/Patient Goals at Discharge (Final Review):   Goals and Risk Factor Review - 09/02/22 1118       Core Components/Risk Factors/Patient Goals Review   Personal Goals Review Diabetes;Heart Failure;Weight Management/Obesity  Review Javaughn has been checking his sugar in the morning and reports it is going well most days. He is attending dialysis regularly and is working on managing his heart failure symptoms. He has noticed a slight increase in stamina and wants to work on his strength. He has met his weight goal and wants to maintain his weight which he knows he needs to manage his fluid balance.    Expected Outcomes Short: maintain weight by managing fluid intake and eating consistenly. Long:attend the program consistenly to help manage his heart failure and diabetes.             ITP Comments:  ITP Comments     Row Name 07/27/22 1613 08/03/22 1656 08/05/22 1541 08/12/22 0924 09/09/22 1345   ITP Comments Initial phone call completed. Diagnosis can be found in [facility] 12/5. EP orientation scheduled for 3/11. Completed and gym orientation. Initial ITP created and sent for review to Dr. Bethann Punches, Medical Director. First full day of exercise!  Patient was oriented to gym and equipment including functions, settings, policies, and procedures.  Patient's individual exercise prescription and treatment plan were reviewed.  All starting workloads were established based on the results of the 6 minute walk test done at initial orientation  visit.  The plan for exercise progression was also introduced and progression will be customized based on patient's performance and goals.' 30 Day review completed. Medical Director ITP review done, changes made as directed, and signed approval by Medical Director.     new to program 30 day review completed. ITP sent to Dr. Bethann Punches, Medical Director of Cardiac Rehab. Continue with ITP unless changes are made by physician.            Comments: 30 day review

## 2022-09-09 NOTE — Progress Notes (Signed)
Daily Session Note  Patient Details  Name: Joseph Mcpherson MRN: [id number] Date of Birth: [birth year] Referring Provider:   Flowsheet Row Cardiac Rehab from 08/03/2022 in [facility] Cardiac and Pulmonary Rehab  Referring Provider Dr. Darrold Junker       Encounter Date: 09/09/2022  Check In:  Session Check In - 09/09/22 1103       Check-In   Supervising physician immediately available to respond to emergencies See telemetry face sheet for immediately available ER MD    Location [facility]-Cardiac & Pulmonary Rehab    Staff Present Lanny Hurst, RN, ADN;Joseph Reino Kent, RCP,RRT,BSRT;Noah Tickle, BS, Exercise Physiologist;Meredith Jewel Baize, RN BSN    Virtual Visit No    Medication changes reported     No    Fall or balance concerns reported    No    Warm-up and Cool-down Performed on first and last piece of equipment    Resistance Training Performed Yes    VAD Patient? No    PAD/SET Patient? No      Pain Assessment   Currently in Pain? No/denies                Social History   Tobacco Use  Smoking Status Never  Smokeless Tobacco Former    Goals Met:  Independence with exercise equipment Exercise tolerated well No report of concerns or symptoms today Strength training completed today  Goals Unmet:  Not Applicable  Comments: Pt able to follow exercise prescription today without complaint.  Will continue to monitor for progression.    Dr. Bethann Punches is Medical Director for [facility] Cardiac Rehabilitation.  Dr. Vida Rigger is Medical Director for [facility] Pulmonary Rehabilitation.

## 2022-09-10 DIAGNOSIS — Z992 Dependence on renal dialysis: Secondary | ICD-10-CM | POA: Diagnosis not present

## 2022-09-10 DIAGNOSIS — N186 End stage renal disease: Secondary | ICD-10-CM | POA: Diagnosis not present

## 2022-09-10 DIAGNOSIS — N2581 Secondary hyperparathyroidism of renal origin: Secondary | ICD-10-CM | POA: Diagnosis not present

## 2022-09-11 DIAGNOSIS — Z951 Presence of aortocoronary bypass graft: Secondary | ICD-10-CM | POA: Diagnosis not present

## 2022-09-11 NOTE — Progress Notes (Signed)
Daily Session Note  Patient Details  Name: Joseph Mcpherson MRN: [id number] Date of Birth: July 19, 1945 Referring Provider:   Flowsheet Row Cardiac Rehab from 08/03/2022 in [facility] Cardiac and Pulmonary Rehab  Referring Provider Dr. Darrold Junker       Encounter Date: 09/11/2022  Check In:  Session Check In - 09/11/22 1136       Check-In   Supervising physician immediately available to respond to emergencies See telemetry face sheet for immediately available ER MD    Location [facility]-Cardiac & Pulmonary Rehab    Staff Present Harlene Ramus, RN, BSN;Joseph Logan, RCP,RRT,BSRT;Jessica [place], [place], RCEP, CCRP, CCET    Virtual Visit No    Medication changes reported     No    Fall or balance concerns reported    No    Tobacco Cessation No Change    Warm-up and Cool-down Performed on first and last piece of equipment    Resistance Training Performed Yes    VAD Patient? No    PAD/SET Patient? No      Pain Assessment   Currently in Pain? No/denies                Social History   Tobacco Use  Smoking Status Never  Smokeless Tobacco Former    Goals Met:  Proper associated with RPD/PD & O2 Sat Independence with exercise equipment Using PLB without cueing & demonstrates good technique Exercise tolerated well No report of concerns or symptoms today Strength training completed today  Goals Unmet:  Not Applicable  Comments: Pt able to follow exercise prescription today without complaint.  Will continue to monitor for progression.   Dr. Bethann Punches is Medical Director for [facility] Cardiac Rehabilitation.  Dr. Vida Rigger is Medical Director for [facility] Pulmonary Rehabilitation.

## 2022-09-12 DIAGNOSIS — N2581 Secondary hyperparathyroidism of renal origin: Secondary | ICD-10-CM | POA: Diagnosis not present

## 2022-09-12 DIAGNOSIS — N186 End stage renal disease: Secondary | ICD-10-CM | POA: Diagnosis not present

## 2022-09-12 DIAGNOSIS — Z992 Dependence on renal dialysis: Secondary | ICD-10-CM | POA: Diagnosis not present

## 2022-09-14 ENCOUNTER — Encounter: Payer: Medicare HMO | Admitting: *Deleted

## 2022-09-14 DIAGNOSIS — R9082 White matter disease, unspecified: Secondary | ICD-10-CM | POA: Diagnosis not present

## 2022-09-14 DIAGNOSIS — I6381 Other cerebral infarction due to occlusion or stenosis of small artery: Secondary | ICD-10-CM | POA: Diagnosis not present

## 2022-09-14 DIAGNOSIS — E0842 Diabetes mellitus due to underlying condition with diabetic polyneuropathy: Secondary | ICD-10-CM | POA: Diagnosis not present

## 2022-09-14 DIAGNOSIS — R4189 Other symptoms and signs involving cognitive functions and awareness: Secondary | ICD-10-CM | POA: Diagnosis not present

## 2022-09-14 DIAGNOSIS — Z951 Presence of aortocoronary bypass graft: Secondary | ICD-10-CM | POA: Diagnosis not present

## 2022-09-14 DIAGNOSIS — Z8673 Personal history of transient ischemic attack (TIA), and cerebral infarction without residual deficits: Secondary | ICD-10-CM | POA: Diagnosis not present

## 2022-09-14 DIAGNOSIS — N186 End stage renal disease: Secondary | ICD-10-CM | POA: Diagnosis not present

## 2022-09-14 NOTE — Progress Notes (Signed)
Daily Session Note  Patient Details  Name: Joseph Mcpherson MRN: [id number] Date of Birth: [birth year] Referring Provider:   Flowsheet Row Cardiac Rehab from 08/03/2022 in [facility] Cardiac and Pulmonary Rehab  Referring Provider Dr. Darrold Junker       Encounter Date: 09/14/2022  Check In:  Session Check In - 09/14/22 1107       Check-In   Supervising physician immediately available to respond to emergencies See telemetry face sheet for immediately available ER MD    Location [facility]-Cardiac & Pulmonary Rehab    Staff Present Lanny Hurst, RN, Franki Monte, BS, ACSM CEP, Exercise Physiologist;Noah Tickle, BS, Exercise Physiologist    Virtual Visit No    Medication changes reported     No    Fall or balance concerns reported    No    Warm-up and Cool-down Performed on first and last piece of equipment    Resistance Training Performed Yes    VAD Patient? No    PAD/SET Patient? No      Pain Assessment   Currently in Pain? No/denies                Social History   Tobacco Use  Smoking Status Never  Smokeless Tobacco Former    Goals Met:  Independence with exercise equipment Exercise tolerated well No report of concerns or symptoms today Strength training completed today  Goals Unmet:  Not Applicable  Comments: Pt able to follow exercise prescription today without complaint.  Will continue to monitor for progression.    Dr. Bethann Punches is Medical Director for [facility] Cardiac Rehabilitation.  Dr. Vida Rigger is Medical Director for [facility] Pulmonary Rehabilitation.

## 2022-09-15 DIAGNOSIS — Z992 Dependence on renal dialysis: Secondary | ICD-10-CM | POA: Diagnosis not present

## 2022-09-15 DIAGNOSIS — N186 End stage renal disease: Secondary | ICD-10-CM | POA: Diagnosis not present

## 2022-09-15 DIAGNOSIS — N2581 Secondary hyperparathyroidism of renal origin: Secondary | ICD-10-CM | POA: Diagnosis not present

## 2022-09-16 ENCOUNTER — Encounter: Payer: Medicare HMO | Admitting: *Deleted

## 2022-09-16 ENCOUNTER — Ambulatory Visit: Payer: Self-pay

## 2022-09-16 DIAGNOSIS — Z951 Presence of aortocoronary bypass graft: Secondary | ICD-10-CM

## 2022-09-16 NOTE — Patient Outreach (Addendum)
  Care Coordination   Follow Up Visit Note   09/18/2022 Name: Joseph Mcpherson MRN: [id number] DOB: 1946-01-14  Joseph Mcpherson is a 77 y.o. year old male who sees Joseph Downs, MD for primary care. I  spoke with patients spouse Joseph Mcpherson.   What matters to the patients health and wellness today?  Wife states patient continues to get stronger with cardiac rehab and has improved cognitively. Wife states patient is able to dress himself and walks without assistive device. She states he uses wheelchair for distance.  Denies falls.  Wife reports home health services have ended.  Per chart review patient is on transplant list.  Had follow up with neurologist on 09/14/22.    Goals Addressed             This Visit's Progress    Care coordination activities/ Post hospital follow up / management of health conditions       Interventions Today    Flowsheet Row Most Recent Value  Chronic Disease   Chronic disease during today's visit Atrial Fibrillation (AFib), Congestive Heart Failure (CHF), Chronic Kidney Disease/End Stage Renal Disease (ESRD), Other  [status post stroke/ status post CABG X 4]  General Interventions   General Interventions Discussed/Reviewed General Interventions Reviewed, Doctor Visits  [Evaluation of current treatment plan for HF, HTN, A-FIB, ESRD, status post stroke and CABGX 4 and patients adherence to plan as establised by provider. Assessed for A-fib, HF, stroke like symptoms.  Assessed for [facility] services. Assessed BP/ pulse readings.]  Doctor Visits Discussed/Reviewed Doctor Visits Reviewed  Annabell Sabal scheduled / upcoming provider visits. Confirmed patient has transportation to appointments. Discussed ongoing care coordination follow up.]  Exercise Interventions   Exercise Discussed/Reviewed Physical Activity  [Encouraged importance of remaining physically active as tolerated and incorporationg taught cardiac rehab exercises at home as recommended.]  Physical Activity Discussed/Reviewed  Physical Activity Reviewed  Education Interventions   Education Provided Provided Education  Provided Verbal Education On Other  [Reviewed heart failure, A-fib symptoms. Discussed importance of weighing daily and recording. Patient transferred  to care coordinator Joseph Durie, RN due to primary provider change.]  Pharmacy Interventions   Pharmacy Dicussed/Reviewed Pharmacy Topics Reviewed  [medications reviewed and compliance discussed.]                 SDOH assessments and interventions completed:  No     Care Coordination Interventions:  Yes, provided   Follow up plan: Follow up call scheduled for 10/14/22    Encounter Outcome:  Pt. Visit Completed

## 2022-09-16 NOTE — Progress Notes (Signed)
Daily Session Note  Patient Details  Name: Joseph Mcpherson MRN: [id number] Date of Birth: Jul 17, 1945 Referring Provider:   Flowsheet Row Cardiac Rehab from 08/03/2022 in [facility] Cardiac and Pulmonary Rehab  Referring Provider Dr. Darrold Junker       Encounter Date: 09/16/2022  Check In:  Session Check In - 09/16/22 1121       Check-In   Supervising physician immediately available to respond to emergencies See telemetry face sheet for immediately available ER MD    Location [facility]-Cardiac & Pulmonary Rehab    Staff Present Lanny Hurst, RN, ADN;Joseph Reino Kent, RCP,RRT,BSRT;Noah Tickle, BS, Exercise Physiologist    Virtual Visit No    Medication changes reported     No    Fall or balance concerns reported    No    Warm-up and Cool-down Performed on first and last piece of equipment    Resistance Training Performed Yes    VAD Patient? No    PAD/SET Patient? No      Pain Assessment   Currently in Pain? No/denies                Social History   Tobacco Use  Smoking Status Never  Smokeless Tobacco Former    Goals Met:  Independence with exercise equipment Exercise tolerated well No report of concerns or symptoms today Strength training completed today  Goals Unmet:  Not Applicable  Comments: Pt able to follow exercise prescription today without complaint.  Will continue to monitor for progression.    Dr. Bethann Punches is Medical Director for [facility] Cardiac Rehabilitation.  Dr. Vida Rigger is Medical Director for [facility] Pulmonary Rehabilitation.

## 2022-09-17 DIAGNOSIS — N2581 Secondary hyperparathyroidism of renal origin: Secondary | ICD-10-CM | POA: Diagnosis not present

## 2022-09-17 DIAGNOSIS — Z992 Dependence on renal dialysis: Secondary | ICD-10-CM | POA: Diagnosis not present

## 2022-09-17 DIAGNOSIS — N186 End stage renal disease: Secondary | ICD-10-CM | POA: Diagnosis not present

## 2022-09-18 ENCOUNTER — Encounter: Payer: Medicare HMO | Admitting: *Deleted

## 2022-09-18 DIAGNOSIS — Z951 Presence of aortocoronary bypass graft: Secondary | ICD-10-CM | POA: Diagnosis not present

## 2022-09-18 NOTE — Progress Notes (Signed)
Daily Session Note  Patient Details  Name: Joseph Mcpherson MRN: [id number] Date of Birth: 1946/04/15 Referring Provider:   Flowsheet Row Cardiac Rehab from 08/03/2022 in [facility] Cardiac and Pulmonary Rehab  Referring Provider Dr. Darrold Junker       Encounter Date: 09/18/2022  Check In:  Session Check In - 09/18/22 1144       Check-In   Supervising physician immediately available to respond to emergencies See telemetry face sheet for immediately available ER MD    Location [facility]-Cardiac & Pulmonary Rehab    Staff Present Roger Shelter, RN, BSN, CCRP;Khamille Beynon Karleen Hampshire RN, BSN   Arianna Pownal, BS &  [place], [place]   Virtual Visit No    Medication changes reported     No    Fall or balance concerns reported    No    Tobacco Cessation No Change    Warm-up and Cool-down Performed on first and last piece of equipment    Resistance Training Performed Yes    VAD Patient? No    PAD/SET Patient? No      Pain Assessment   Currently in Pain? No/denies                Social History   Tobacco Use  Smoking Status Never  Smokeless Tobacco Former    Goals Met:  Independence with exercise equipment Exercise tolerated well No report of concerns or symptoms today Strength training completed today  Goals Unmet:  Not Applicable  Comments: Pt able to follow exercise prescription today without complaint.  Will continue to monitor for progression.    Dr. Bethann Punches is Medical Director for [facility] Cardiac Rehabilitation.  Dr. Vida Rigger is Medical Director for [facility] Pulmonary Rehabilitation.

## 2022-09-19 DIAGNOSIS — Z992 Dependence on renal dialysis: Secondary | ICD-10-CM | POA: Diagnosis not present

## 2022-09-19 DIAGNOSIS — N2581 Secondary hyperparathyroidism of renal origin: Secondary | ICD-10-CM | POA: Diagnosis not present

## 2022-09-19 DIAGNOSIS — N186 End stage renal disease: Secondary | ICD-10-CM | POA: Diagnosis not present

## 2022-09-21 ENCOUNTER — Encounter: Payer: Medicare HMO | Admitting: *Deleted

## 2022-09-21 DIAGNOSIS — Z951 Presence of aortocoronary bypass graft: Secondary | ICD-10-CM | POA: Diagnosis not present

## 2022-09-21 NOTE — Progress Notes (Signed)
Daily Session Note  Patient Details  Name: Joseph Mcpherson MRN: [id number] Date of Birth: Mar 15, 1946 Referring Provider:   Flowsheet Row Cardiac Rehab from 08/03/2022 in [facility] Cardiac and Pulmonary Rehab  Referring Provider Dr. Darrold Junker       Encounter Date: 09/21/2022  Check In:  Session Check In - 09/21/22 1104       Check-In   Supervising physician immediately available to respond to emergencies See telemetry face sheet for immediately available ER MD    Location [facility]-Cardiac & Pulmonary Rehab    Staff Present Lanny Hurst, RN, Franki Monte, BS, ACSM CEP, Exercise Physiologist;Meredith Jewel Baize, RN BSN;Melissa Caiola MS, RDN, LDN    Virtual Visit No    Medication changes reported     No    Fall or balance concerns reported    No    Warm-up and Cool-down Performed on first and last piece of equipment    Resistance Training Performed Yes    VAD Patient? No    PAD/SET Patient? No      Pain Assessment   Currently in Pain? No/denies                Social History   Tobacco Use  Smoking Status Never  Smokeless Tobacco Former    Goals Met:  Independence with exercise equipment Exercise tolerated well No report of concerns or symptoms today Strength training completed today  Goals Unmet:  Not Applicable  Comments: Pt able to follow exercise prescription today without complaint.  Will continue to monitor for progression.    Dr. Bethann Punches is Medical Director for [facility] Cardiac Rehabilitation.  Dr. Vida Rigger is Medical Director for [facility] Pulmonary Rehabilitation.

## 2022-09-22 DIAGNOSIS — Z992 Dependence on renal dialysis: Secondary | ICD-10-CM | POA: Diagnosis not present

## 2022-09-22 DIAGNOSIS — N186 End stage renal disease: Secondary | ICD-10-CM | POA: Diagnosis not present

## 2022-09-22 DIAGNOSIS — N2581 Secondary hyperparathyroidism of renal origin: Secondary | ICD-10-CM | POA: Diagnosis not present

## 2022-09-22 DIAGNOSIS — E1122 Type 2 diabetes mellitus with diabetic chronic kidney disease: Secondary | ICD-10-CM | POA: Diagnosis not present

## 2022-09-23 ENCOUNTER — Encounter: Payer: Medicare HMO | Attending: Cardiology | Admitting: *Deleted

## 2022-09-23 VITALS — Ht 64.0 in | Wt 142.2 lb

## 2022-09-23 DIAGNOSIS — Z951 Presence of aortocoronary bypass graft: Secondary | ICD-10-CM | POA: Insufficient documentation

## 2022-09-23 NOTE — Progress Notes (Signed)
Daily Session Note  Patient Details  Name: Joseph Mcpherson MRN: [id number] Date of Birth: 07/11/1945 Referring Provider:   Flowsheet Row Cardiac Rehab from 08/03/2022 in [facility] Cardiac and Pulmonary Rehab  Referring Provider Dr. Darrold Junker       Encounter Date: 09/23/2022  Check In:  Session Check In - 09/23/22 1110       Check-In   Supervising physician immediately available to respond to emergencies See telemetry face sheet for immediately available ER MD    Location [facility]-Cardiac & Pulmonary Rehab    Staff Present Susann Givens, RN BSN;Noah Tickle, BS, Exercise Physiologist;Jessica [place], MA, RCEP, CCRP, Zackery Barefoot, MS, ACSM CEP, Exercise Physiologist    Virtual Visit No    Medication changes reported     No    Fall or balance concerns reported    No    Warm-up and Cool-down Performed on first and last piece of equipment    Resistance Training Performed Yes    VAD Patient? No    PAD/SET Patient? No      Pain Assessment   Currently in Pain? No/denies                Social History   Tobacco Use  Smoking Status Never  Smokeless Tobacco Former    Goals Met:  Independence with exercise equipment Exercise tolerated well No report of concerns or symptoms today Strength training completed today  Goals Unmet:  Not Applicable  Comments: Pt able to follow exercise prescription today without complaint.  Will continue to monitor for progression.    Dr. Bethann Punches is Medical Director for [facility] Cardiac Rehabilitation.  Dr. Vida Rigger is Medical Director for [facility] Pulmonary Rehabilitation.

## 2022-09-23 NOTE — Patient Instructions (Signed)
Discharge Patient Instructions  Patient Details  Name: Joseph Mcpherson MRN: [id number] Date of Birth: Sep 20, 1945 Referring Provider:  Corky Downs, MD   Number of Visits: 23  Reason for Discharge:  Early Exit:  Personal  Diagnosis:  S/P CABG x 4  Initial Exercise Prescription:  Initial Exercise Prescription - 08/03/22 1700       Date of Initial Exercise RX and Referring Provider   Date 08/03/22    Referring Provider Dr. Darrold Junker      Oxygen   Maintain Oxygen Saturation 88% or higher      Recumbant Bike   Level 1    RPM 50    Watts 10    Minutes 15      NuStep   Level 1    SPM 80    Minutes 15    METs 1.04      T5 Nustep   Level 1    SPM 80    Minutes 15    METs 1.04      Track   Laps 5    Minutes 15    METs 1.27      Prescription Details   Frequency (times per week) 3    Duration Progress to 30 minutes of continuous aerobic without signs/symptoms of physical distress      Intensity   THRR 40-80% of Max Heartrate 93-127    Ratings of Perceived Exertion 11-13    Perceived Dyspnea 0-4      Progression   Progression Continue to progress workloads to maintain intensity without signs/symptoms of physical distress.      Resistance Training   Training Prescription Yes    Weight 2    Reps 10-15             Discharge Exercise Prescription (Final Exercise Prescription Changes):  Exercise Prescription Changes - 09/22/22 1500       Response to Exercise   Blood Pressure (Admit) 118/62    Blood Pressure (Exit) 118/60    Heart Rate (Admit) 69 bpm    Heart Rate (Exercise) 77 bpm    Heart Rate (Exit) 65 bpm    Rating of Perceived Exertion (Exercise) 13    Symptoms none    Duration Continue with 30 min of aerobic exercise without signs/symptoms of physical distress.    Intensity THRR unchanged      Progression   Progression Continue to progress workloads to maintain intensity without signs/symptoms of physical distress.    Average METs 1.51       Resistance Training   Training Prescription Yes    Weight 2 lb    Reps 10-15      Interval Training   Interval Training No      NuStep   Level 3    Minutes 15    METs 1.2      Biostep-RELP   Level 1    Minutes 5      Track   Laps 4   hallway   Minutes 15    METs 1.2      Home Exercise Plan   Plans to continue exercise at Home (comment)   walking, chair exercises   Frequency Add 2 additional days to program exercise sessions.    Initial Home Exercises Provided 09/04/22      Oxygen   Maintain Oxygen Saturation 88% or higher             Functional Capacity:  6 Minute Walk     Row Name 08/03/22  1657 09/23/22 1134       6 Minute Walk   Phase Initial Discharge    Distance 480 feet 500 feet    Distance % Change -- 4.2 %    Distance Feet Change -- 20 ft    Walk Time 4 minutes 5.25 minutes    # of Rest Breaks 1  stopped test at 4 min mark due to being tired. 1    MPH 1.36 1.08    METS 1.04 1.22    RPE 13 13    Perceived Dyspnea  0 0    VO2 Peak 3.36 4.29    Symptoms No No    Resting HR 60 bpm 74 bpm    Resting BP 110/70 134/72    Resting Oxygen Saturation  99 % 95 %    Exercise Oxygen Saturation  during 6 min walk 100 % 92 %    Max Ex. HR 80 bpm 82 bpm    Max Ex. BP 128/62 150/74    2 Minute Post BP 118/62 --            Nutrition & Weight - Outcomes:  Pre Biometrics - 08/03/22 1705       Pre Biometrics   Waist Circumference --   deferred   Hip Circumference --   deferred            Post Biometrics - 09/23/22 1136        Post  Biometrics   Height 5\' 4"  (1.626 m)    Weight 142 lb 3.2 oz (64.5 kg)    BMI (Calculated) 24.4    Single Leg Stand --   not attempted           Nutrition:  Nutrition Therapy & Goals - 09/07/22 1107       Nutrition Therapy   Diet Heart healthy, low Na, T2Dm and dialysis MNT    Drug/Food Interactions Statins/Certain Fruits    Protein (specify units) 75g    Fiber 30 grams    Whole Grain Foods 3 servings     Saturated Fats 20 max. grams    Fruits and Vegetables 8 servings/day    Sodium 2 grams      Personal Nutrition Goals   Nutrition Goal ST: continue to follow recommendations of renal dietitian at HD LT: meet protein and calorie needs, maintain A1C <7    Comments 77 y.o. M admitted to cardiac rehab s/p CABG x 4.  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 Medications reviewed atorvastatin, vitamin D3, vitamin B12, novolog, levemir, synthroid, pantoprazole, miralax, rena-vite, phoslo (calcium acetate). Labs reviewed. Joseph Mcpherson and his wife came into see RD, however, Joseph Mcpherson did not speak much during consultation. His wife reports speaking with MD and dietitian at HD clinic regarding Joseph Mcpherson. Joseph Mcpherson's wife reports that he will normally eat 2 meals/day - she reports he does not eat much meat, but will eat eggs and yogurt; she will normally cook him a meal in the evening or he would like to take in food. His wife reports that he is no longer taking the phosphate binder with meals and has been told to monitor his potassium; discussed the importance of including fiber rich foods during the day for heart health while following guidelines set by renal dietitian based off of his lab results. She reports that he does not eat much usually and has been eating this way for a long time - she  reports his weight is stable. His wife also reports his BG doing well overall and that he does not have many BG lows anymore. Discussed the importance eating enough protein and calories during the day. Patient and wife report having no questions at this time.      Intervention Plan   Intervention Prescribe, educate and counsel regarding individualized specific dietary modifications aiming towards targeted core components such as weight, hypertension, lipid management, diabetes, heart failure and other comorbidities.    Expected Outcomes Short Term  Goal: Understand basic principles of dietary content, such as calories, fat, sodium, cholesterol and nutrients.;Short Term Goal: A plan has been developed with personal nutrition goals set during dietitian appointment.;Long Term Goal: Adherence to prescribed nutrition plan.

## 2022-09-24 DIAGNOSIS — Z992 Dependence on renal dialysis: Secondary | ICD-10-CM | POA: Diagnosis not present

## 2022-09-24 DIAGNOSIS — N186 End stage renal disease: Secondary | ICD-10-CM | POA: Diagnosis not present

## 2022-09-24 DIAGNOSIS — N2581 Secondary hyperparathyroidism of renal origin: Secondary | ICD-10-CM | POA: Diagnosis not present

## 2022-09-25 ENCOUNTER — Encounter: Payer: Medicare HMO | Admitting: *Deleted

## 2022-09-25 DIAGNOSIS — Z951 Presence of aortocoronary bypass graft: Secondary | ICD-10-CM

## 2022-09-25 NOTE — Progress Notes (Signed)
Daily Session Note  Patient Details  Name: Joseph Mcpherson MRN: [id number] Date of Birth: [birth year] Referring Provider:   Flowsheet Row Cardiac Rehab from 08/03/2022 in [facility] Cardiac and Pulmonary Rehab  Referring Provider Dr. Darrold Junker       Encounter Date: 09/25/2022  Check In:  Session Check In - 09/25/22 1144       Check-In   Supervising physician immediately available to respond to emergencies See telemetry face sheet for immediately available ER MD    Location [facility]-Cardiac & Pulmonary Rehab    Staff Present Cora Collum, RN, BSN, CCRP;Jessica [place], MA, RCEP, CCRP, CCET;Noah Tickle, BS, Exercise Physiologist    Virtual Visit No    Medication changes reported     No    Fall or balance concerns reported    No    Warm-up and Cool-down Performed on first and last piece of equipment    Resistance Training Performed Yes    VAD Patient? No    PAD/SET Patient? No      Pain Assessment   Currently in Pain? No/denies                Social History   Tobacco Use  Smoking Status Never  Smokeless Tobacco Former    Goals Met:  Independence with exercise equipment Exercise tolerated well Personal goals reviewed No report of concerns or symptoms today  Goals Unmet:  Not Applicable  Comments: Joseph Mcpherson graduated today from  rehab with 23 sessions completed.  Details of the patient's exercise prescription and what He needs to do in order to continue the prescription and progress were discussed with patient.  Patient was given a copy of prescription and goals.  Patient verbalized understanding.  Joseph Mcpherson plans to continue to exercise by walking and joining the [place] after his trip to [place].    Dr. Bethann Punches is Medical Director for [facility] Cardiac Rehabilitation.  Dr. Vida Rigger is Medical Director for [facility] Pulmonary Rehabilitation.

## 2022-09-25 NOTE — Progress Notes (Signed)
Cardiac Individual Treatment Plan  Patient Details  Name: Joseph Mcpherson MRN: [id number] Date of Birth: 11/29/1945 Referring Provider:   Flowsheet Row Cardiac Rehab from 08/03/2022 in [facility] Cardiac and Pulmonary Rehab  Referring Provider Dr. Saralyn Pilar       Initial Encounter Date:  Flowsheet Row Cardiac Rehab from 08/03/2022 in [facility] Cardiac and Pulmonary Rehab  Date 08/03/22       Visit Diagnosis: S/P CABG x 4  Patient's Home Medications on Admission:  Current Outpatient Medications:    albuterol (PROVENTIL) (2.5 MG/3ML) 0.083% nebulizer solution, Take 3 mLs (2.5 mg total) by nebulization every 6 (six) hours as needed for wheezing or shortness of breath., Disp: 150 mL, Rfl: 1   amLODipine (NORVASC) 10 MG tablet, Take 1 tablet (10 mg total) by mouth daily., Disp: 30 tablet, Rfl: 3   aspirin EC 81 MG tablet, Take 81 mg by mouth daily. Swallow whole., Disp: , Rfl:    atorvastatin (LIPITOR) 40 MG tablet, Take 1 tablet (40 mg total) by mouth daily., Disp: 90 tablet, Rfl: 1   B Complex-C-Folic Acid (RENA-VITE RX) 1 MG TABS, Take 1 tablet by mouth daily. , Disp: , Rfl:    BD PEN NEEDLE NANO 2ND GEN 32G X 4 MM MISC, USE AS DIRECTED, Disp: 300 each, Rfl: 1   calcium acetate (PHOSLO) 667 MG capsule, TAKE 2 CAPSULES BY MOUTH 3 TIMES A DAY WITH MEAL, Disp: 540 capsule, Rfl: 1   Cholecalciferol (VITAMIN D3) 5000 units TABS, Take 5,000 Units by mouth daily. , Disp: , Rfl:    clopidogrel (PLAVIX) 75 MG tablet, Take 1 tablet (75 mg total) by mouth daily. (Patient not taking: Reported on 07/27/2022), Disp: 90 tablet, Rfl: 1   Continuous Blood Gluc Sensor (FREESTYLE LIBRE 14 DAY SENSOR) MISC, APPLY EVERY 14 (FOURTEEN) DAYS., Disp: 90 each, Rfl: 1   donepezil (ARICEPT) 10 MG tablet, TAKE 1 TABLET BY MOUTH EVERY DAY, Disp: 90 tablet, Rfl: 3   hydrALAZINE (APRESOLINE) 25 MG tablet, Take 25 mg by mouth 2 (two) times daily. (Patient not taking: Reported on 06/03/2022), Disp: , Rfl:    insulin detemir (LEVEMIR) 100  UNIT/ML injection, Inject 0.15 mLs (15 Units total) into the skin at bedtime. (Patient taking differently: Inject 14 Units into the skin at bedtime.), Disp: 10 mL, Rfl: 11   ipratropium-albuterol (DUONEB) 0.5-2.5 (3) MG/3ML SOLN, Take 3 mLs by nebulization every 6 (six) hours as needed., Disp: 120 mL, Rfl: 4   levETIRAcetam (KEPPRA) 250 MG tablet, Take 1 tablet (250 mg total) by mouth 3 (three) times a week. On Tuesday ,Thursday and Saturday after Hemodialysis (Patient not taking: Reported on 07/27/2022), Disp: 30 tablet, Rfl: 0   levETIRAcetam (KEPPRA) 500 MG tablet, Take 1 tablet (500 mg total) by mouth daily. (Patient not taking: Reported on 07/27/2022), Disp: 30 tablet, Rfl: 1   levothyroxine (SYNTHROID) 137 MCG tablet, TAKE 1 TABLET BY MOUTH EVERY DAY, Disp: 90 tablet, Rfl: 3   losartan (COZAAR) 100 MG tablet, Take 1 tablet (100 mg total) by mouth every evening., Disp: 30 tablet, Rfl: 3   metoprolol succinate (TOPROL-XL) 100 MG 24 hr tablet, Take 1 tablet (100 mg total) by mouth daily. Take with or immediately following a meal. (Patient not taking: Reported on 07/27/2022), Disp: , Rfl:    multivitamin (RENA-VIT) TABS tablet, Take 1 tablet by mouth daily., Disp: , Rfl: 0   pantoprazole (PROTONIX) 40 MG tablet, TAKE 1 TABLET BY MOUTH EVERY DAY, Disp: 90 tablet, Rfl: 3   sevelamer  carbonate (RENVELA) 2.4 g PACK, Take 2.4 g by mouth daily., Disp: 90 each, Rfl: 3   sitaGLIPtin (JANUVIA) 50 MG tablet, Take 50 mg by mouth every evening.  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 II   Diabetic eye exam ([place]) 12/2019   ESRD (end stage renal disease) ([place])    MWF- [facility]   GERD (gastroesophageal reflux disease)    [name] (hard of hearing)    Hyperlipidemia    Hypertension    Hypothyroidism     Thyroid disease     Tobacco Use: Social History   Tobacco Use  Smoking Status Never  Smokeless Tobacco Former    Labs: Review Flowsheet  More data exists      Latest Ref Rng & Units 02/12/2021 01/18/2022 04/11/2022 04/13/2022 04/15/2022  Labs for ITP Cardiac and Pulmonary Rehab  Cholestrol 0 - 200 mg/dL - - - - 120   LDL (calc) 0 - 99 mg/dL - - - - 66   HDL-C >40 mg/dL - - - - 42   Trlycerides <150 mg/dL - - - - 60   Hemoglobin A1c 4.8 - 5.6 % 7.0  8.1  5.9  - -  Bicarbonate 20.0 - 28.0 mmol/L - - - 28.5  -  O2 Saturation % - - - 92.2  -     Exercise Target Goals: Exercise Program Goal: Individual exercise prescription set using results from initial 6 min walk test and THRR while considering  patient's activity barriers and safety.   Exercise Prescription Goal: Initial exercise prescription builds to 30-45 minutes a day of aerobic activity, 2-3 days per week.  Home exercise guidelines will be given to patient during program as part of exercise prescription that the participant will acknowledge.   Education: Aerobic Exercise: - Group verbal and visual presentation on the components of exercise prescription. Introduces F.I.T.T principle from ACSM for exercise prescriptions.  Reviews F.I.T.T. principles of aerobic exercise including progression. Written material given at graduation.   Education: Resistance Exercise: - Group verbal and visual presentation on the components of exercise prescription. Introduces F.I.T.T principle from ACSM for exercise prescriptions  Reviews F.I.T.T. principles of resistance exercise including progression. Written material given at graduation.    Education: Exercise & Equipment Safety: - Individual verbal instruction and demonstration of equipment use and safety with use of the equipment. Flowsheet Row Cardiac Rehab from 08/03/2022 in [facility] Cardiac and Pulmonary Rehab  Date 08/03/22  Educator [facility]  Instruction Review Code 1- Verbalizes  Understanding       Education: Exercise Physiology & General Exercise Guidelines: - Group verbal and written instruction with models to review the exercise physiology of the cardiovascular system and associated critical values. Provides general exercise guidelines with specific guidelines to those with heart or lung disease.    Education: Flexibility, Balance, Mind/Body Relaxation: - Group verbal and visual presentation with interactive activity on the components of exercise prescription. Introduces F.I.T.T principle from ACSM for exercise prescriptions. Reviews F.I.T.T. principles of flexibility and balance exercise training including progression. Also discusses the mind body connection.  Reviews various relaxation techniques to help reduce and manage stress (i.e. Deep breathing, progressive muscle relaxation, and visualization). Balance handout provided to take home. Written material given at graduation.   Activity Barriers & Risk Stratification:  Activity Barriers & Cardiac Risk Stratification - 08/03/22 1700       Activity Barriers & Cardiac Risk Stratification   Activity Barriers None;Assistive Device    Cardiac Risk Stratification High             6 Minute Walk:  6 Minute Walk     Row Name 08/03/22 1657 09/23/22 1134       6 Minute Walk   Phase Initial Discharge    Distance 480 feet 500 feet    Distance % Change -- 4.2 %    Distance Feet Change -- 20 ft    Walk Time 4 minutes 5.25 minutes    # of Rest Breaks 1  stopped test at 4 min mark due to being tired. 1    MPH 1.36 1.08    METS 1.04 1.22    RPE 13 13    Perceived Dyspnea  0 0    VO2 Peak 3.36 4.29    Symptoms No No    Resting HR 60 bpm 74 bpm    Resting BP 110/70 134/72    Resting Oxygen Saturation  99 % 95 %    Exercise Oxygen Saturation  during 6 min walk 100 % 92 %    Max Ex. HR 80 bpm 82 bpm    Max Ex. BP 128/62 150/74    2 Minute Post BP 118/62 --             Oxygen Initial  Assessment:   Oxygen Re-Evaluation:   Oxygen Discharge (Final Oxygen Re-Evaluation):   Initial Exercise Prescription:  Initial Exercise Prescription - 08/03/22 1700       Date of Initial Exercise RX and Referring Provider   Date 08/03/22    Referring Provider Dr. Darrold Junker      Oxygen   Maintain Oxygen Saturation 88% or higher      Recumbant Bike   Level 1    RPM 50    Watts 10    Minutes 15      NuStep   Level 1    SPM 80    Minutes 15    METs 1.04      T5 Nustep   Level 1    SPM 80    Minutes 15    METs 1.04      Track   Laps 5    Minutes 15    METs 1.27      Prescription Details   Frequency (times per week) 3    Duration Progress to 30 minutes of continuous aerobic without signs/symptoms of physical distress      Intensity   THRR 40-80% of Max Heartrate 93-127    Ratings of Perceived Exertion 11-13    Perceived Dyspnea 0-4      Progression   Progression Continue to progress workloads to maintain intensity without signs/symptoms of physical distress.      Resistance Training   Training Prescription Yes    Weight 2    Reps 10-15             Perform Capillary Blood Glucose checks as needed.  Exercise Prescription Changes:   Exercise Prescription Changes     Row Name 08/03/22 1700 08/11/22 1500 08/26/22 1300 09/04/22 1100 09/09/22 1300     Response to Exercise   Blood Pressure (Admit) 110/70 108/62 120/68 -- 126/74   Blood Pressure (Exercise) 128/62 124/62 112/58 -- 142/72   Blood Pressure (Exit) 118/62 110/60 110/58 -- 100/56   Heart  Rate (Admit) 60 bpm 73 bpm 66 bpm -- 66 bpm   Heart Rate (Exercise) 80 bpm 78 bpm 73 bpm -- 83 bpm   Heart Rate (Exit) 60 bpm 61 bpm 63 bpm -- 65 bpm   Oxygen Saturation (Admit) 99 % -- -- -- --   Oxygen Saturation (Exercise) 100 % -- -- -- --   Oxygen Saturation (Exit) 100 % -- -- -- --   Rating of Perceived Exertion (Exercise) 13 13 13  -- 13   Perceived Dyspnea (Exercise) 0 -- -- -- --   Symptoms  none none none -- none   Comments 6 MWT results First two days of exercise -- -- --   Duration -- Progress to 30 minutes of  aerobic without signs/symptoms of physical distress Progress to 30 minutes of  aerobic without signs/symptoms of physical distress -- Progress to 30 minutes of  aerobic without signs/symptoms of physical distress   Intensity -- THRR unchanged THRR unchanged -- THRR unchanged     Progression   Progression -- Continue to progress workloads to maintain intensity without signs/symptoms of physical distress. Continue to progress workloads to maintain intensity without signs/symptoms of physical distress. -- Continue to progress workloads to maintain intensity without signs/symptoms of physical distress.   Average METs -- 1.23 1.15 -- 1.39     Resistance Training   Training Prescription -- Yes Yes -- Yes   Weight -- 2 lb 2 lb -- 2 lb   Reps -- 10-15 10-15 -- 10-15     Interval Training   Interval Training -- No No -- No     Treadmill   MPH -- -- -- -- 1   Grade -- -- -- -- 0   Minutes -- -- -- -- 15   METs -- -- -- -- 15     NuStep   Level -- 1 2 -- 3   Minutes -- 15 15 -- 15   METs -- 1.3 1.1 -- 2     Biostep-RELP   Level -- -- 1 -- 1   Minutes -- -- 15 -- 15   METs -- -- 1 -- 1     Track   Laps -- 3 4  hallway -- 2  hallway   Minutes -- 15 15 -- 15   METs -- 1.16 1.2 -- 1.1     Home Exercise Plan   Plans to continue exercise at -- -- -- Home (comment)  walking, chair exercises Home (comment)  walking, chair exercises   Frequency -- -- -- Add 2 additional days to program exercise sessions. Add 2 additional days to program exercise sessions.   Initial Home Exercises Provided -- -- -- 09/04/22 09/04/22     Oxygen   Maintain Oxygen Saturation -- 88% or higher 88% or higher -- 88% or higher    Row Name 09/22/22 1500             Response to Exercise   Blood Pressure (Admit) 118/62       Blood Pressure (Exit) 118/60       Heart Rate (Admit) 69 bpm        Heart Rate (Exercise) 77 bpm       Heart Rate (Exit) 65 bpm       Rating of Perceived Exertion (Exercise) 13       Symptoms none       Duration Continue with 30 min of aerobic exercise without signs/symptoms of physical distress.  Intensity THRR unchanged         Progression   Progression Continue to progress workloads to maintain intensity without signs/symptoms of physical distress.       Average METs 1.51         Resistance Training   Training Prescription Yes       Weight 2 lb       Reps 10-15         Interval Training   Interval Training No         NuStep   Level 3       Minutes 15       METs 1.2         Biostep-RELP   Level 1       Minutes 5         Track   Laps 4  hallway       Minutes 15       METs 1.2         Home Exercise Plan   Plans to continue exercise at Home (comment)  walking, chair exercises       Frequency Add 2 additional days to program exercise sessions.       Initial Home Exercises Provided 09/04/22         Oxygen   Maintain Oxygen Saturation 88% or higher                Exercise Comments:   Exercise Comments     Row Name 08/05/22 1541 09/25/22 1144         Exercise Comments First full day of exercise!  Patient was oriented to gym and equipment including functions, settings, policies, and procedures.  Patient's individual exercise prescription and treatment plan were reviewed.  All starting workloads were established based on the results of the 6 minute walk test done at initial orientation visit.  The plan for exercise progression was also introduced and progression will be customized based on patient's performance and goals.' Jediah graduated today from  rehab with 23 sessions completed.  Details of the patient's exercise prescription and what He needs to do in order to continue the prescription and progress were discussed with patient.  Patient was given a copy of prescription and goals.  Patient verbalized understanding.  Joshawn  plans to continue to exercise by walking and joining the [place] after his trip to [place].               Exercise Goals and Review:   Exercise Goals     Row Name 08/03/22 1704             Exercise Goals   Increase Physical Activity Yes       Intervention Provide advice, education, support and counseling about physical activity/exercise needs.;Develop an individualized exercise prescription for aerobic and resistive training based on initial evaluation findings, risk stratification, comorbidities and participant's personal goals.       Expected Outcomes Short Term: Attend rehab on a regular basis to increase amount of physical activity.;Long Term: Add in home exercise to make exercise part of routine and to increase amount of physical activity.;Long Term: Exercising regularly at least 3-5 days a week.       Increase Strength and Stamina Yes       Intervention Provide advice, education, support and counseling about physical activity/exercise needs.;Develop an individualized exercise prescription for aerobic and resistive training based on initial evaluation findings, risk stratification, comorbidities and participant's personal goals.  Expected Outcomes Short Term: Increase workloads from initial exercise prescription for resistance, speed, and METs.;Short Term: Perform resistance training exercises routinely during rehab and add in resistance training at home;Long Term: Improve cardiorespiratory fitness, muscular endurance and strength as measured by increased METs and functional capacity ( )       Able to understand and use rate of perceived exertion (RPE) scale Yes       Intervention Provide education and explanation on how to use RPE scale       Expected Outcomes Short Term: Able to use RPE daily in rehab to express subjective intensity level;Long Term:  Able to use RPE to guide intensity level when exercising independently       Able to understand and use Dyspnea scale Yes        Intervention Provide education and explanation on how to use Dyspnea scale       Expected Outcomes Short Term: Able to use Dyspnea scale daily in rehab to express subjective sense of shortness of breath during exertion;Long Term: Able to use Dyspnea scale to guide intensity level when exercising independently       Knowledge and understanding of Target Heart Rate Range (THRR) Yes       Intervention Provide education and explanation of THRR including how the numbers were predicted and where they are located for reference       Expected Outcomes Short Term: Able to state/look up THRR;Long Term: Able to use THRR to govern intensity when exercising independently;Short Term: Able to use daily as guideline for intensity in rehab       Able to check pulse independently Yes       Intervention Provide education and demonstration on how to check pulse in carotid and radial arteries.;Review the importance of being able to check your own pulse for safety during independent exercise       Expected Outcomes Short Term: Able to explain why pulse checking is important during independent exercise       Understanding of Exercise Prescription Yes       Intervention Provide education, explanation, and written materials on patient's individual exercise prescription       Expected Outcomes Short Term: Able to explain program exercise prescription;Long Term: Able to explain home exercise prescription to exercise independently                Exercise Goals Re-Evaluation :  Exercise Goals Re-Evaluation     Row Name 08/05/22 1541 08/11/22 1526 08/26/22 1339 09/02/22 1131 09/04/22 1130     Exercise Goal Re-Evaluation   Exercise Goals Review Increase Physical Activity;Able to understand and use rate of perceived exertion (RPE) scale;Knowledge and understanding of Target Heart Rate Range (THRR);Understanding of Exercise Prescription;Increase Strength and Stamina;Able to check pulse independently Increase Physical  Activity;Increase Strength and Stamina;Understanding of Exercise Prescription Increase Physical Activity;Increase Strength and Stamina;Understanding of Exercise Prescription Increase Physical Activity Increase Physical Activity;Increase Strength and Stamina;Able to understand and use rate of perceived exertion (RPE) scale;Able to understand and use Dyspnea scale;Knowledge and understanding of Target Heart Rate Range (THRR);Able to check pulse independently;Understanding of Exercise Prescription   Comments Reviewed RPE scale, THR and program prescription with pt today.  Pt voiced understanding and was given a copy of goals to take home. Hai is off to a good start in the program. During his first two sessions of rehab he had an average MET level of 1.23 METs. He also was able to walk 3 laps on the track, and did  well with level 1 on the T4 nustep. He tolerated using 2 lb hand weights for resistance training as well. We will continue to monitor his progress in the program. Deklan continues to come to rehab and exercises at his initial exercise prescription. He did, however, increase to level 2 on the T4 Nustep. He paces himself when walking and averages 2-4 laps each time. He is not quite hitting his THR but should be a higher increase when workloads are increased. His RPEs are staying appropriate. We will continue to monitor. Bucky is showing interest in trying the treadmill and is open to increasing his levels. His tolerance is increasing. His sleep schedule limits his energy but he is willing to try to think about how he can increase his activity level at home Reviewed home exercise with pt today.  Pt plans to walk in house at home with walker for exercise.  He was also encouraged to try chair exercises as well.  Reviewed THR, pulse, RPE, sign and symptoms, pulse oximetery and when to call 911 or MD.  Also discussed weather considerations and indoor options.  Pt voiced understanding.   Expected Outcomes Short:  Use RPE daily to regulate intensity.  Long: Follow program prescription in THR. Short: Continue to follow current exercise prescription. Long: Continue to improve strength and stamina. Short: Increase laps on track by 1 extra Long: Continue to increase overall MET level and stamina Short: try the treadmill in class with supervision. Long: develop an exercise routine that works with his schedule Short: Start to add in walking at home Long: Conitnue to improve stamina    Row Name 09/09/22 1325 09/22/22 1518           Exercise Goal Re-Evaluation   Exercise Goals Review Increase Physical Activity;Increase Strength and Stamina;Understanding of Exercise Prescription Increase Physical Activity;Increase Strength and Stamina;Understanding of Exercise Prescription      Comments Merl is doing well in the program. He recently tried using the treadmill for the first time and was able to walk at a speed of 1 mph with no incline for around 3 minutes. He also improved from level 2 to level 3 on the T4. He has continued to use 2 lb hand weights for resistance training also. We will continue to monitor his progress in the program. Kohen is doing well in rehab, he is consistently coming to rehab. He is very limited on his exercise machines to work with and has stuck with the T4 and Track primarily. He did try out the Biostep at level 1 as well for a short time. He continues to reach about 4 laps in the hallway when walking, we will encourage patient to increase that to 5 next time. His RPEs are staying in appropriate range. There is a potential that he graduates early per his decision, and will let us know when he decides to do so. Will continue to monitor.      Expected Outcomes Short: Continue to build up stamina with walking. Long: Continue to improve strength and stamina. Short: Strive for 5 laps in the hallway Long: Continue to increase overall MET level and stamina               Discharge Exercise Prescription  (Final Exercise Prescription Changes):  Exercise Prescription Changes - 09/22/22 1500       Response to Exercise   Blood Pressure (Admit) 118/62    Blood Pressure (Exit) 118/60    Heart Rate (Admit) 69 bpm  Heart Rate (Exercise) 77 bpm    Heart Rate (Exit) 65 bpm    Rating of Perceived Exertion (Exercise) 13    Symptoms none    Duration Continue with 30 min of aerobic exercise without signs/symptoms of physical distress.    Intensity THRR unchanged      Progression   Progression Continue to progress workloads to maintain intensity without signs/symptoms of physical distress.    Average METs 1.51      Resistance Training   Training Prescription Yes    Weight 2 lb    Reps 10-15      Interval Training   Interval Training No      NuStep   Level 3    Minutes 15    METs 1.2      Biostep-RELP   Level 1    Minutes 5      Track   Laps 4   hallway   Minutes 15    METs 1.2      Home Exercise Plan   Plans to continue exercise at Home (comment)   walking, chair exercises   Frequency Add 2 additional days to program exercise sessions.    Initial Home Exercises Provided 09/04/22      Oxygen   Maintain Oxygen Saturation 88% or higher             Nutrition:  Target Goals: Understanding of nutrition guidelines, daily intake of sodium 1500mg , cholesterol 200mg , calories 30% from fat and 7% or less from saturated fats, daily to have 5 or more servings of fruits and vegetables.  Education: All About Nutrition: -Group instruction provided by verbal, written material, interactive activities, discussions, models, and posters to present general guidelines for heart healthy nutrition including fat, fiber, MyPlate, the role of sodium in heart healthy nutrition, utilization of the nutrition label, and utilization of this knowledge for meal planning. Follow up email sent as well. Written material given at graduation.   Biometrics:  Pre Biometrics - 08/03/22 1705       Pre  Biometrics   Waist Circumference --   deferred   Hip Circumference --   deferred            Post Biometrics - 09/23/22 1136        Post  Biometrics   Height 5\' 4"  (1.626 m)    Weight 142 lb 3.2 oz (64.5 kg)    BMI (Calculated) 24.4    Single Leg Stand --   not attempted            Nutrition Therapy Plan and Nutrition Goals:  Nutrition Therapy & Goals - 09/07/22 1107       Nutrition Therapy   Diet Heart healthy, low Na, T2Dm and dialysis MNT    Drug/Food Interactions Statins/Certain Fruits    Protein (specify units) 75g    Fiber 30 grams    Whole Grain Foods 3 servings    Saturated Fats 20 max. grams    Fruits and Vegetables 8 servings/day    Sodium 2 grams      Personal Nutrition Goals   Nutrition Goal ST: continue to follow recommendations of renal dietitian at HD [facility]: meet protein and calorie needs, maintain A1C <7    Comments 77 y.o. M admitted to cardiac rehab s/p CABG x 4.  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 Medications reviewed atorvastatin, vitamin D3, vitamin B12, novolog, levemir, synthroid, pantoprazole, miralax, rena-vite,  phoslo (calcium acetate). Labs reviewed. Kaziah and his wife came into see RD, however, Mckinnley did not speak much during consultation. His wife reports speaking with MD and dietitian at HD clinic regarding [facility]. Bastion's wife reports that he will normally eat 2 meals/day - she reports he does not eat much meat, but will eat eggs and yogurt; she will normally cook him a meal in the evening or he would like to take in food. His wife reports that he is no longer taking the phosphate binder with meals and has been told to monitor his potassium; discussed the importance of including fiber rich foods during the day for heart health while following guidelines set by renal dietitian based off of his lab results. She reports that he does not eat much usually and  has been eating this way for a long time - she reports his weight is stable. His wife also reports his BG doing well overall and that he does not have many BG lows anymore. Discussed the importance eating enough protein and calories during the day. Patient and wife report having no questions at this time.      Intervention Plan   Intervention Prescribe, educate and counsel regarding individualized specific dietary modifications aiming towards targeted core components such as weight, hypertension, lipid management, diabetes, heart failure and other comorbidities.    Expected Outcomes Short Term Goal: Understand basic principles of dietary content, such as calories, fat, sodium, cholesterol and nutrients.;Short Term Goal: A plan has been developed with personal nutrition goals set during dietitian appointment.;Long Term Goal: Adherence to prescribed nutrition plan.             Nutrition Assessments:  MEDIFICTS Score Key: ?70 Need to make dietary changes  40-70 Heart Healthy Diet ? 40 Therapeutic Level Cholesterol Diet  Flowsheet Row Cardiac Rehab from 09/07/2022 in [facility] Cardiac and Pulmonary Rehab  Picture Your Plate Total Score on Admission 44      Picture Your Plate Scores: <96 Unhealthy dietary pattern with much room for improvement. 41-50 Dietary pattern unlikely to meet recommendations for good health and room for improvement. 51-60 More healthful dietary pattern, with some room for improvement.  >60 Healthy dietary pattern, although there may be some specific behaviors that could be improved.    Nutrition Goals Re-Evaluation:  Nutrition Goals Re-Evaluation     Row Name 09/02/22 1128             Goals   Comment Shelley wants his wife to meet with the dietician, but scheduling with her has been difficult. He does talk with his dialysis staff about his renal diet. He mentions he also is aware of what foods affect his diabetes.       Expected Outcome Short: meet with dietician  if schedules allow; Long: independently manage renal diet.                Nutrition Goals Discharge (Final Nutrition Goals Re-Evaluation):  Nutrition Goals Re-Evaluation - 09/02/22 1128       Goals   Comment Stefone wants his wife to meet with the dietician, but scheduling with her has been difficult. He does talk with his dialysis staff about his renal diet. He mentions he also is aware of what foods affect his diabetes.    Expected Outcome Short: meet with dietician if schedules allow; Long: independently manage renal diet.             Psychosocial: Target Goals: Acknowledge presence or absence of significant depression and/or  stress, maximize coping skills, provide positive support system. Participant is able to verbalize types and ability to use techniques and skills needed for reducing stress and depression.   Education: Stress, Anxiety, and Depression - Group verbal and visual presentation to define topics covered.  Reviews how body is impacted by stress, anxiety, and depression.  Also discusses healthy ways to reduce stress and to treat/manage anxiety and depression.  Written material given at graduation.   Education: Sleep Hygiene -Provides group verbal and written instruction about how sleep can affect your health.  Define sleep hygiene, discuss sleep cycles and impact of sleep habits. Review good sleep hygiene tips.    Initial Review & Psychosocial Screening:  Initial Psych Review & Screening - 07/27/22 1553       Initial Review   Current issues with None Identified      Family Dynamics   Good Support System? Yes   Wife and Son            Quality of Life Scores:   Quality of Life - 08/03/22 1709       Quality of Life   Select --   deferred, patient unable to complete QOL questionaire.           Scores of 19 and below usually indicate a poorer quality of life in these areas.  A difference of  2-3 points is a clinically meaningful difference.  A  difference of 2-3 points in the total score of the Quality of Life Index has been associated with significant improvement in overall quality of life, self-image, physical symptoms, and general health in studies assessing change in quality of life.  PHQ-9: Review Flowsheet       06/19/2021 03/12/2020  Depression screen PHQ 2/9  Decreased Interest 0 0  Down, Depressed, Hopeless 0 0  PHQ - 2 Score 0 0   Interpretation of Total Score  Total Score Depression Severity:  1-4 = Minimal depression, 5-9 = Mild depression, 10-14 = Moderate depression, 15-19 = Moderately severe depression, 20-27 = Severe depression   Psychosocial Evaluation and Intervention:  Psychosocial Evaluation - 07/27/22 1605       Psychosocial Evaluation & Interventions   Interventions Encouraged to exercise with the program and follow exercise prescription;Relaxation education;Stress management education    Comments 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 He has no barriers for attending the program. He lives with his wife and has good support from his son "Willeen Cass" also who was on the phone call today. Son states he would like his father to have encourangement to complete program to obtain a more active lifestyle after bypass surgery, to increase strength, move around more independantly and potentially to be able to drive again. Cordarro does use a walker occasionally with ambulation. 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    Expected Outcomes Short: Attend cardiac rehab for education and exercise. Long:  Develop and maintain positive self care habits    Continue Psychosocial Services  Follow up required by staff             Psychosocial Re-Evaluation:  Psychosocial Re-Evaluation     Row Name 09/02/22 1124             Psychosocial Re-Evaluation   Current issues with Current Sleep Concerns       Comments Demir's sleep schedule has  been a stressor. He has a very hard time falling asleep  until early morning. Coming to class at 70 has kept him on a regular wake up schedule, but he is still struggling with falling asleep. He looks forward to his naps in the afternoon. He mentions his wife is a big help at home. He doesn't always feel up for coming to class, but his wife and he knows its for the best. It is hard to manage dialysis schedule on top of class schedule, leaving him with only Sunday free. He knows this is temporary and he wants to see it through.       Expected Outcomes Short: attend class consistenlty to maintain a schedule; limit sleep distractions at night. Long: maintain a positive sleep schedule.       Interventions Encouraged to attend Cardiac Rehabilitation for the exercise       Continue Psychosocial Services  Follow up required by staff                Psychosocial Discharge (Final Psychosocial Re-Evaluation):  Psychosocial Re-Evaluation - 09/02/22 1124       Psychosocial Re-Evaluation   Current issues with Current Sleep Concerns    Comments Caetano's sleep schedule has been a stressor. He has a very hard time falling asleep until early morning. Coming to class at 58 has kept him on a regular wake up schedule, but he is still struggling with falling asleep. He looks forward to his naps in the afternoon. He mentions his wife is a big help at home. He doesn't always feel up for coming to class, but his wife and he knows its for the best. It is hard to manage dialysis schedule on top of class schedule, leaving him with only Sunday free. He knows this is temporary and he wants to see it through.    Expected Outcomes Short: attend class consistenlty to maintain a schedule; limit sleep distractions at night. Long: maintain a positive sleep schedule.    Interventions Encouraged to attend Cardiac Rehabilitation for the exercise    Continue Psychosocial Services  Follow up required by staff              Vocational Rehabilitation: Provide vocational rehab assistance to qualifying candidates.   Vocational Rehab Evaluation & Intervention:  Vocational Rehab - 07/27/22 1605       Initial Vocational Rehab Evaluation & Intervention   Assessment shows need for Vocational Rehabilitation No             Education: Education Goals: Education classes will be provided on a variety of topics geared toward better understanding of heart health and risk factor modification. Participant will state understanding/return demonstration of topics presented as noted by education test scores.  Learning Barriers/Preferences:  Learning Barriers/Preferences - 07/27/22 1605       Learning Barriers/Preferences   Learning Barriers None    Learning Preferences None             General Cardiac Education Topics:  AED/CPR: - Group verbal and written instruction with the use of models to demonstrate the basic use of the AED with the basic ABC's of resuscitation.   Anatomy and Cardiac Procedures: - Group verbal and visual presentation and models provide information about basic cardiac anatomy and function. Reviews the testing methods done to diagnose heart disease and the outcomes of the test results. Describes the treatment choices: Medical Management, Angioplasty, or Coronary Bypass Surgery for treating various heart conditions including Myocardial Infarction, Angina, Valve Disease, and Cardiac Arrhythmias.  Written material given at graduation.  Medication Safety: - Group verbal and visual instruction to review commonly prescribed medications for heart and lung disease. Reviews the medication, class of the drug, and side effects. Includes the steps to properly store meds and maintain the prescription regimen.  Written material given at graduation.   Intimacy: - Group verbal instruction through game format to discuss how heart and lung disease can affect sexual intimacy. Written material given at  graduation..   Know Your Numbers and Heart Failure: - Group verbal and visual instruction to discuss disease risk factors for cardiac and pulmonary disease and treatment options.  Reviews associated critical values for Overweight/Obesity, Hypertension, Cholesterol, and Diabetes.  Discusses basics of heart failure: signs/symptoms and treatments.  Introduces Heart Failure Zone chart for action plan for heart failure.  Written material given at graduation.   Infection Prevention: - Provides verbal and written material to individual with discussion of infection control including proper hand washing and proper equipment cleaning during exercise session. Flowsheet Row Cardiac Rehab from 08/03/2022 in [facility] Cardiac and Pulmonary Rehab  Date 08/03/22  Educator [facility]  Instruction Review Code 1- Verbalizes Understanding       Falls Prevention: - Provides verbal and written material to individual with discussion of falls prevention and safety. Flowsheet Row Cardiac Rehab from 08/03/2022 in [facility] Cardiac and Pulmonary Rehab  Date 08/03/22  Educator [facility]  Instruction Review Code 1- Verbalizes Understanding       Other: -Provides group and verbal instruction on various topics (see comments)   Knowledge Questionnaire Score:  Knowledge Questionnaire Score - 08/03/22 1709       Knowledge Questionnaire Score   Pre Score deferred, Patient unable to complete knowledge questionaire.             Core Components/Risk Factors/Patient Goals at Admission:  Personal Goals and Risk Factors at Admission - 08/03/22 1710       Core Components/Risk Factors/Patient Goals on Admission    Weight Management Yes    Intervention Weight Management: Develop a combined nutrition and exercise program designed to reach desired caloric intake, while maintaining appropriate intake of nutrient and fiber, sodium and fats, and appropriate energy expenditure required for the weight goal.;Weight Management: Provide education  and appropriate resources to help participant work on and attain dietary goals.;Weight Management/Obesity: Establish reasonable short term and long term weight goals.    Admit Weight 142 lb (64.4 kg)    Goal Weight: Short Term 140 lb (63.5 kg)    Goal Weight: Long Term 140 lb (63.5 kg)    Expected Outcomes Short Term: Continue to assess and modify interventions until short term weight is achieved;Weight Maintenance: Understanding of the daily nutrition guidelines, which includes 25-35% calories from fat, 7% or less cal from saturated fats, less than 200mg  cholesterol, less than 1.5gm of sodium, & 5 or more servings of fruits and vegetables daily;Understanding recommendations for meals to include 15-35% energy as protein, 25-35% energy from fat, 35-60% energy from carbohydrates, less than 200mg  of dietary cholesterol, 20-35 gm of total fiber daily;Understanding of distribution of calorie intake throughout the day with the consumption of 4-5 meals/snacks;Long Term: Adherence to nutrition and physical activity/exercise program aimed toward attainment of established weight goal    Diabetes Yes    Intervention Provide education about signs/symptoms and action to take for hypo/hyperglycemia.;Provide education about proper nutrition, including hydration, and aerobic/resistive exercise prescription along with prescribed medications to achieve blood glucose in normal ranges: Fasting glucose 65-99 mg/dL    Expected Outcomes Short Term: Participant verbalizes  understanding of the signs/symptoms and immediate care of hyper/hypoglycemia, proper foot care and importance of medication, aerobic/resistive exercise and nutrition plan for blood glucose control.;Long Term: Attainment of HbA1C < 7%.    Heart Failure Yes    Intervention Provide a combined exercise and nutrition program that is supplemented with education, support and counseling about heart failure. Directed toward relieving symptoms such as shortness of breath,  decreased exercise tolerance, and extremity edema.    Expected Outcomes Improve functional capacity of life;Short term: Attendance in program 2-3 days a week with increased exercise capacity. Reported lower sodium intake. Reported increased fruit and vegetable intake. Reports medication compliance.;Short term: Daily weights obtained and reported for increase. Utilizing diuretic protocols set by physician.;Long term: Adoption of self-care skills and reduction of barriers for early signs and symptoms recognition and intervention leading to self-care maintenance.    Hypertension Yes    Intervention Provide education on lifestyle modifcations including regular physical activity/exercise, weight management, moderate sodium restriction and increased consumption of fresh fruit, vegetables, and low fat dairy, alcohol moderation, and smoking cessation.;Monitor prescription use compliance.    Expected Outcomes Short Term: Continued assessment and intervention until BP is < 140/73mm HG in hypertensive participants. < 130/43mm HG in hypertensive participants with diabetes, heart failure or chronic kidney disease.;Long Term: Maintenance of blood pressure at goal levels.    Lipids Yes    Intervention Provide education and support for participant on nutrition & aerobic/resistive exercise along with prescribed medications to achieve LDL 70mg , HDL >40mg .    Expected Outcomes Short Term: Participant states understanding of desired cholesterol values and is compliant with medications prescribed. Participant is following exercise prescription and nutrition guidelines.;Long Term: Cholesterol controlled with medications as prescribed, with individualized exercise RX and with personalized nutrition plan. Value goals: LDL < 70mg , HDL > 40 mg.             Education:Diabetes - Individual verbal and written instruction to review signs/symptoms of diabetes, desired ranges of glucose level fasting, after meals and with  exercise. Acknowledge that pre and post exercise glucose checks will be done for 3 sessions at entry of program. Flowsheet Row Cardiac Rehab from 08/03/2022 in [facility] Cardiac and Pulmonary Rehab  Date 08/03/22  Educator [facility]  Instruction Review Code 1- Verbalizes Understanding       Core Components/Risk Factors/Patient Goals Review:   Goals and Risk Factor Review     Row Name 09/02/22 1118             Core Components/Risk Factors/Patient Goals Review   Personal Goals Review Diabetes;Heart Failure;Weight Management/Obesity       Review Andrae has been checking his sugar in the morning and reports it is going well most days. He is attending dialysis regularly and is working on managing his heart failure symptoms. He has noticed a slight increase in stamina and wants to work on his strength. He has met his weight goal and wants to maintain his weight which he knows he needs to manage his fluid balance.       Expected Outcomes Short: maintain weight by managing fluid intake and eating consistenly. Long:attend the program consistenly to help manage his heart failure and diabetes.                Core Components/Risk Factors/Patient Goals at Discharge (Final Review):   Goals and Risk Factor Review - 09/02/22 1118       Core Components/Risk Factors/Patient Goals Review   Personal Goals Review Diabetes;Heart Failure;Weight Management/Obesity  Review Avory has been checking his sugar in the morning and reports it is going well most days. He is attending dialysis regularly and is working on managing his heart failure symptoms. He has noticed a slight increase in stamina and wants to work on his strength. He has met his weight goal and wants to maintain his weight which he knows he needs to manage his fluid balance.    Expected Outcomes Short: maintain weight by managing fluid intake and eating consistenly. Long:attend the program consistenly to help manage his heart failure and diabetes.              ITP Comments:  ITP Comments     Row Name 07/27/22 1613 08/03/22 1656 08/05/22 1541 08/12/22 0924 09/09/22 1345   ITP Comments Initial phone call completed. Diagnosis can be found in [facility] 12/5. EP orientation scheduled for 3/11. Completed and gym orientation. Initial ITP created and sent for review to Dr. Bethann Punches, Medical Director. First full day of exercise!  Patient was oriented to gym and equipment including functions, settings, policies, and procedures.  Patient's individual exercise prescription and treatment plan were reviewed.  All starting workloads were established based on the results of the 6 minute walk test done at initial orientation visit.  The plan for exercise progression was also introduced and progression will be customized based on patient's performance and goals.' 30 Day review completed. Medical Director ITP review done, changes made as directed, and signed approval by Medical Director.     new to program 30 day review completed. ITP sent to Dr. Bethann Punches, Medical Director of Cardiac Rehab. Continue with ITP unless changes are made by physician.    Row Name 09/25/22 1146           ITP Comments Mitchelle graduated today from  rehab with 23 sessions completed.  Details of the patient's exercise prescription and what He needs to do in order to continue the prescription and progress were discussed with patient.  Patient was given a copy of prescription and goals.  Patient verbalized understanding.  Johnthan plans to continue to exercise by walking and joining the [place] after his trip to [place].                Comments: Discharge ITP

## 2022-09-25 NOTE — Progress Notes (Signed)
Discharge NOte  Joseph Mcpherson  DOB Feb 08, 1946  Joseph Mcpherson graduated today from  rehab with 23 sessions completed.  Details of the patient's exercise prescription and what He needs to do in order to continue the prescription and progress were discussed with patient.  Patient was given a copy of prescription and goals.  Patient verbalized understanding.  Trea plans to continue to exercise by walking and joining the [place] after his trip to [place].   6 Minute Walk     Row Name 08/03/22 1657 09/23/22 1134       6 Minute Walk   Phase Initial Discharge    Distance 480 feet 500 feet    Distance % Change -- 4.2 %    Distance Feet Change -- 20 ft    Walk Time 4 minutes 5.25 minutes    # of Rest Breaks 1  stopped test at 4 min mark due to being tired. 1    MPH 1.36 1.08    METS 1.04 1.22    RPE 13 13    Perceived Dyspnea  0 0    VO2 Peak 3.36 4.29    Symptoms No No    Resting HR 60 bpm 74 bpm    Resting BP 110/70 134/72    Resting Oxygen Saturation  99 % 95 %    Exercise Oxygen Saturation  during 6 min walk 100 % 92 %    Max Ex. HR 80 bpm 82 bpm    Max Ex. BP 128/62 150/74    2 Minute Post BP 118/62 --            Thank you for the referral.

## 2022-09-26 DIAGNOSIS — Z992 Dependence on renal dialysis: Secondary | ICD-10-CM | POA: Diagnosis not present

## 2022-09-26 DIAGNOSIS — N2581 Secondary hyperparathyroidism of renal origin: Secondary | ICD-10-CM | POA: Diagnosis not present

## 2022-09-26 DIAGNOSIS — N186 End stage renal disease: Secondary | ICD-10-CM | POA: Diagnosis not present

## 2022-09-28 ENCOUNTER — Encounter: Payer: Medicare HMO | Admitting: *Deleted

## 2022-09-28 DIAGNOSIS — R0600 Dyspnea, unspecified: Secondary | ICD-10-CM | POA: Diagnosis not present

## 2022-09-28 DIAGNOSIS — R0689 Other abnormalities of breathing: Secondary | ICD-10-CM | POA: Diagnosis not present

## 2022-09-29 DIAGNOSIS — N2581 Secondary hyperparathyroidism of renal origin: Secondary | ICD-10-CM | POA: Diagnosis not present

## 2022-09-29 DIAGNOSIS — N186 End stage renal disease: Secondary | ICD-10-CM | POA: Diagnosis not present

## 2022-09-29 DIAGNOSIS — Z992 Dependence on renal dialysis: Secondary | ICD-10-CM | POA: Diagnosis not present

## 2022-09-30 ENCOUNTER — Encounter: Payer: Medicare HMO | Admitting: *Deleted

## 2022-10-01 DIAGNOSIS — Z992 Dependence on renal dialysis: Secondary | ICD-10-CM | POA: Diagnosis not present

## 2022-10-01 DIAGNOSIS — N2581 Secondary hyperparathyroidism of renal origin: Secondary | ICD-10-CM | POA: Diagnosis not present

## 2022-10-01 DIAGNOSIS — N186 End stage renal disease: Secondary | ICD-10-CM | POA: Diagnosis not present

## 2022-10-03 DIAGNOSIS — N186 End stage renal disease: Secondary | ICD-10-CM | POA: Diagnosis not present

## 2022-10-03 DIAGNOSIS — N2581 Secondary hyperparathyroidism of renal origin: Secondary | ICD-10-CM | POA: Diagnosis not present

## 2022-10-03 DIAGNOSIS — Z992 Dependence on renal dialysis: Secondary | ICD-10-CM | POA: Diagnosis not present

## 2022-10-06 DIAGNOSIS — Z992 Dependence on renal dialysis: Secondary | ICD-10-CM | POA: Diagnosis not present

## 2022-10-06 DIAGNOSIS — N2581 Secondary hyperparathyroidism of renal origin: Secondary | ICD-10-CM | POA: Diagnosis not present

## 2022-10-06 DIAGNOSIS — N186 End stage renal disease: Secondary | ICD-10-CM | POA: Diagnosis not present

## 2022-10-07 ENCOUNTER — Encounter: Payer: Self-pay | Admitting: *Deleted

## 2022-10-07 DIAGNOSIS — Z951 Presence of aortocoronary bypass graft: Secondary | ICD-10-CM

## 2022-10-07 NOTE — Progress Notes (Signed)
Cardiac Individual Treatment Plan  Patient Details  Name: Joseph Mcpherson MRN: [id number] Date of Birth: 11/29/1945 Referring Provider:   Flowsheet Row Cardiac Rehab from 08/03/2022 in [facility] Cardiac and Pulmonary Rehab  Referring Provider Dr. Saralyn Pilar       Initial Encounter Date:  Flowsheet Row Cardiac Rehab from 08/03/2022 in [facility] Cardiac and Pulmonary Rehab  Date 08/03/22       Visit Diagnosis: S/P CABG x 4  Patient's Home Medications on Admission:  Current Outpatient Medications:    albuterol (PROVENTIL) (2.5 MG/3ML) 0.083% nebulizer solution, Take 3 mLs (2.5 mg total) by nebulization every 6 (six) hours as needed for wheezing or shortness of breath., Disp: 150 mL, Rfl: 1   amLODipine (NORVASC) 10 MG tablet, Take 1 tablet (10 mg total) by mouth daily., Disp: 30 tablet, Rfl: 3   aspirin EC 81 MG tablet, Take 81 mg by mouth daily. Swallow whole., Disp: , Rfl:    atorvastatin (LIPITOR) 40 MG tablet, Take 1 tablet (40 mg total) by mouth daily., Disp: 90 tablet, Rfl: 1   B Complex-C-Folic Acid (RENA-VITE RX) 1 MG TABS, Take 1 tablet by mouth daily. , Disp: , Rfl:    BD PEN NEEDLE NANO 2ND GEN 32G X 4 MM MISC, USE AS DIRECTED, Disp: 300 each, Rfl: 1   calcium acetate (PHOSLO) 667 MG capsule, TAKE 2 CAPSULES BY MOUTH 3 TIMES A DAY WITH MEAL, Disp: 540 capsule, Rfl: 1   Cholecalciferol (VITAMIN D3) 5000 units TABS, Take 5,000 Units by mouth daily. , Disp: , Rfl:    clopidogrel (PLAVIX) 75 MG tablet, Take 1 tablet (75 mg total) by mouth daily. (Patient not taking: Reported on 07/27/2022), Disp: 90 tablet, Rfl: 1   Continuous Blood Gluc Sensor (FREESTYLE LIBRE 14 DAY SENSOR) MISC, APPLY EVERY 14 (FOURTEEN) DAYS., Disp: 90 each, Rfl: 1   donepezil (ARICEPT) 10 MG tablet, TAKE 1 TABLET BY MOUTH EVERY DAY, Disp: 90 tablet, Rfl: 3   hydrALAZINE (APRESOLINE) 25 MG tablet, Take 25 mg by mouth 2 (two) times daily. (Patient not taking: Reported on 06/03/2022), Disp: , Rfl:    insulin detemir (LEVEMIR) 100  UNIT/ML injection, Inject 0.15 mLs (15 Units total) into the skin at bedtime. (Patient taking differently: Inject 14 Units into the skin at bedtime.), Disp: 10 mL, Rfl: 11   ipratropium-albuterol (DUONEB) 0.5-2.5 (3) MG/3ML SOLN, Take 3 mLs by nebulization every 6 (six) hours as needed., Disp: 120 mL, Rfl: 4   levETIRAcetam (KEPPRA) 250 MG tablet, Take 1 tablet (250 mg total) by mouth 3 (three) times a week. On Tuesday ,Thursday and Saturday after Hemodialysis (Patient not taking: Reported on 07/27/2022), Disp: 30 tablet, Rfl: 0   levETIRAcetam (KEPPRA) 500 MG tablet, Take 1 tablet (500 mg total) by mouth daily. (Patient not taking: Reported on 07/27/2022), Disp: 30 tablet, Rfl: 1   levothyroxine (SYNTHROID) 137 MCG tablet, TAKE 1 TABLET BY MOUTH EVERY DAY, Disp: 90 tablet, Rfl: 3   losartan (COZAAR) 100 MG tablet, Take 1 tablet (100 mg total) by mouth every evening., Disp: 30 tablet, Rfl: 3   metoprolol succinate (TOPROL-XL) 100 MG 24 hr tablet, Take 1 tablet (100 mg total) by mouth daily. Take with or immediately following a meal. (Patient not taking: Reported on 07/27/2022), Disp: , Rfl:    multivitamin (RENA-VIT) TABS tablet, Take 1 tablet by mouth daily., Disp: , Rfl: 0   pantoprazole (PROTONIX) 40 MG tablet, TAKE 1 TABLET BY MOUTH EVERY DAY, Disp: 90 tablet, Rfl: 3   sevelamer  carbonate (RENVELA) 2.4 g PACK, Take 2.4 g by mouth daily., Disp: 90 each, Rfl: 3   sitaGLIPtin (JANUVIA) 50 MG tablet, Take 50 mg by mouth every evening.  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 II   Diabetic eye exam ([place]) 12/2019   ESRD (end stage renal disease) ([place])    MWF- [facility]   GERD (gastroesophageal reflux disease)    [name] (hard of hearing)    Hyperlipidemia    Hypertension    Hypothyroidism     Thyroid disease     Tobacco Use: Social History   Tobacco Use  Smoking Status Never  Smokeless Tobacco Former    Labs: Review Flowsheet  More data exists      Latest Ref Rng & Units 02/12/2021 01/18/2022 04/11/2022 04/13/2022 04/15/2022  Labs for ITP Cardiac and Pulmonary Rehab  Cholestrol 0 - 200 mg/dL - - - - 120   LDL (calc) 0 - 99 mg/dL - - - - 66   HDL-C >40 mg/dL - - - - 42   Trlycerides <150 mg/dL - - - - 60   Hemoglobin A1c 4.8 - 5.6 % 7.0  8.1  5.9  - -  Bicarbonate 20.0 - 28.0 mmol/L - - - 28.5  -  O2 Saturation % - - - 92.2  -     Exercise Target Goals: Exercise Program Goal: Individual exercise prescription set using results from initial 6 min walk test and THRR while considering  patient's activity barriers and safety.   Exercise Prescription Goal: Initial exercise prescription builds to 30-45 minutes a day of aerobic activity, 2-3 days per week.  Home exercise guidelines will be given to patient during program as part of exercise prescription that the participant will acknowledge.   Education: Aerobic Exercise: - Group verbal and visual presentation on the components of exercise prescription. Introduces F.I.T.T principle from ACSM for exercise prescriptions.  Reviews F.I.T.T. principles of aerobic exercise including progression. Written material given at graduation.   Education: Resistance Exercise: - Group verbal and visual presentation on the components of exercise prescription. Introduces F.I.T.T principle from ACSM for exercise prescriptions  Reviews F.I.T.T. principles of resistance exercise including progression. Written material given at graduation.    Education: Exercise & Equipment Safety: - Individual verbal instruction and demonstration of equipment use and safety with use of the equipment. Flowsheet Row Cardiac Rehab from 08/03/2022 in [facility] Cardiac and Pulmonary Rehab  Date 08/03/22  Educator [facility]  Instruction Review Code 1- Verbalizes  Understanding       Education: Exercise Physiology & General Exercise Guidelines: - Group verbal and written instruction with models to review the exercise physiology of the cardiovascular system and associated critical values. Provides general exercise guidelines with specific guidelines to those with heart or lung disease.    Education: Flexibility, Balance, Mind/Body Relaxation: - Group verbal and visual presentation with interactive activity on the components of exercise prescription. Introduces F.I.T.T principle from ACSM for exercise prescriptions. Reviews F.I.T.T. principles of flexibility and balance exercise training including progression. Also discusses the mind body connection.  Reviews various relaxation techniques to help reduce and manage stress (i.e. Deep breathing, progressive muscle relaxation, and visualization). Balance handout provided to take home. Written material given at graduation.   Activity Barriers & Risk Stratification:  Activity Barriers & Cardiac Risk Stratification - 08/03/22 1700       Activity Barriers & Cardiac Risk Stratification   Activity Barriers None;Assistive Device    Cardiac Risk Stratification High             6 Minute Walk:  6 Minute Walk     Row Name 08/03/22 1657 09/23/22 1134       6 Minute Walk   Phase Initial Discharge    Distance 480 feet 500 feet    Distance % Change -- 4.2 %    Distance Feet Change -- 20 ft    Walk Time 4 minutes 5.25 minutes    # of Rest Breaks 1  stopped test at 4 min mark due to being tired. 1    MPH 1.36 1.08    METS 1.04 1.22    RPE 13 13    Perceived Dyspnea  0 0    VO2 Peak 3.36 4.29    Symptoms No No    Resting HR 60 bpm 74 bpm    Resting BP 110/70 134/72    Resting Oxygen Saturation  99 % 95 %    Exercise Oxygen Saturation  during 6 min walk 100 % 92 %    Max Ex. HR 80 bpm 82 bpm    Max Ex. BP 128/62 150/74    2 Minute Post BP 118/62 --             Oxygen Initial  Assessment:   Oxygen Re-Evaluation:   Oxygen Discharge (Final Oxygen Re-Evaluation):   Initial Exercise Prescription:  Initial Exercise Prescription - 08/03/22 1700       Date of Initial Exercise RX and Referring Provider   Date 08/03/22    Referring Provider Dr. Darrold Junker      Oxygen   Maintain Oxygen Saturation 88% or higher      Recumbant Bike   Level 1    RPM 50    Watts 10    Minutes 15      NuStep   Level 1    SPM 80    Minutes 15    METs 1.04      T5 Nustep   Level 1    SPM 80    Minutes 15    METs 1.04      Track   Laps 5    Minutes 15    METs 1.27      Prescription Details   Frequency (times per week) 3    Duration Progress to 30 minutes of continuous aerobic without signs/symptoms of physical distress      Intensity   THRR 40-80% of Max Heartrate 93-127    Ratings of Perceived Exertion 11-13    Perceived Dyspnea 0-4      Progression   Progression Continue to progress workloads to maintain intensity without signs/symptoms of physical distress.      Resistance Training   Training Prescription Yes    Weight 2    Reps 10-15             Perform Capillary Blood Glucose checks as needed.  Exercise Prescription Changes:   Exercise Prescription Changes     Row Name 08/03/22 1700 08/11/22 1500 08/26/22 1300 09/04/22 1100 09/09/22 1300     Response to Exercise   Blood Pressure (Admit) 110/70 108/62 120/68 -- 126/74   Blood Pressure (Exercise) 128/62 124/62 112/58 -- 142/72   Blood Pressure (Exit) 118/62 110/60 110/58 -- 100/56   Heart  Rate (Admit) 60 bpm 73 bpm 66 bpm -- 66 bpm   Heart Rate (Exercise) 80 bpm 78 bpm 73 bpm -- 83 bpm   Heart Rate (Exit) 60 bpm 61 bpm 63 bpm -- 65 bpm   Oxygen Saturation (Admit) 99 % -- -- -- --   Oxygen Saturation (Exercise) 100 % -- -- -- --   Oxygen Saturation (Exit) 100 % -- -- -- --   Rating of Perceived Exertion (Exercise) 13 13 13  -- 13   Perceived Dyspnea (Exercise) 0 -- -- -- --   Symptoms  none none none -- none   Comments 6 MWT results First two days of exercise -- -- --   Duration -- Progress to 30 minutes of  aerobic without signs/symptoms of physical distress Progress to 30 minutes of  aerobic without signs/symptoms of physical distress -- Progress to 30 minutes of  aerobic without signs/symptoms of physical distress   Intensity -- THRR unchanged THRR unchanged -- THRR unchanged     Progression   Progression -- Continue to progress workloads to maintain intensity without signs/symptoms of physical distress. Continue to progress workloads to maintain intensity without signs/symptoms of physical distress. -- Continue to progress workloads to maintain intensity without signs/symptoms of physical distress.   Average METs -- 1.23 1.15 -- 1.39     Resistance Training   Training Prescription -- Yes Yes -- Yes   Weight -- 2 lb 2 lb -- 2 lb   Reps -- 10-15 10-15 -- 10-15     Interval Training   Interval Training -- No No -- No     Treadmill   MPH -- -- -- -- 1   Grade -- -- -- -- 0   Minutes -- -- -- -- 15   METs -- -- -- -- 15     NuStep   Level -- 1 2 -- 3   Minutes -- 15 15 -- 15   METs -- 1.3 1.1 -- 2     Biostep-RELP   Level -- -- 1 -- 1   Minutes -- -- 15 -- 15   METs -- -- 1 -- 1     Track   Laps -- 3 4  hallway -- 2  hallway   Minutes -- 15 15 -- 15   METs -- 1.16 1.2 -- 1.1     Home Exercise Plan   Plans to continue exercise at -- -- -- Home (comment)  walking, chair exercises Home (comment)  walking, chair exercises   Frequency -- -- -- Add 2 additional days to program exercise sessions. Add 2 additional days to program exercise sessions.   Initial Home Exercises Provided -- -- -- 09/04/22 09/04/22     Oxygen   Maintain Oxygen Saturation -- 88% or higher 88% or higher -- 88% or higher    Row Name 09/22/22 1500             Response to Exercise   Blood Pressure (Admit) 118/62       Blood Pressure (Exit) 118/60       Heart Rate (Admit) 69 bpm        Heart Rate (Exercise) 77 bpm       Heart Rate (Exit) 65 bpm       Rating of Perceived Exertion (Exercise) 13       Symptoms none       Duration Continue with 30 min of aerobic exercise without signs/symptoms of physical distress.  Intensity THRR unchanged         Progression   Progression Continue to progress workloads to maintain intensity without signs/symptoms of physical distress.       Average METs 1.51         Resistance Training   Training Prescription Yes       Weight 2 lb       Reps 10-15         Interval Training   Interval Training No         NuStep   Level 3       Minutes 15       METs 1.2         Biostep-RELP   Level 1       Minutes 5         Track   Laps 4  hallway       Minutes 15       METs 1.2         Home Exercise Plan   Plans to continue exercise at Home (comment)  walking, chair exercises       Frequency Add 2 additional days to program exercise sessions.       Initial Home Exercises Provided 09/04/22         Oxygen   Maintain Oxygen Saturation 88% or higher                Exercise Comments:   Exercise Comments     Row Name 08/05/22 1541 09/25/22 1144         Exercise Comments First full day of exercise!  Patient was oriented to gym and equipment including functions, settings, policies, and procedures.  Patient's individual exercise prescription and treatment plan were reviewed.  All starting workloads were established based on the results of the 6 minute walk test done at initial orientation visit.  The plan for exercise progression was also introduced and progression will be customized based on patient's performance and goals.' Jediah graduated today from  rehab with 23 sessions completed.  Details of the patient's exercise prescription and what He needs to do in order to continue the prescription and progress were discussed with patient.  Patient was given a copy of prescription and goals.  Patient verbalized understanding.  Joshawn  plans to continue to exercise by walking and joining the [place] after his trip to [place].               Exercise Goals and Review:   Exercise Goals     Row Name 08/03/22 1704             Exercise Goals   Increase Physical Activity Yes       Intervention Provide advice, education, support and counseling about physical activity/exercise needs.;Develop an individualized exercise prescription for aerobic and resistive training based on initial evaluation findings, risk stratification, comorbidities and participant's personal goals.       Expected Outcomes Short Term: Attend rehab on a regular basis to increase amount of physical activity.;Long Term: Add in home exercise to make exercise part of routine and to increase amount of physical activity.;Long Term: Exercising regularly at least 3-5 days a week.       Increase Strength and Stamina Yes       Intervention Provide advice, education, support and counseling about physical activity/exercise needs.;Develop an individualized exercise prescription for aerobic and resistive training based on initial evaluation findings, risk stratification, comorbidities and participant's personal goals.  Expected Outcomes Short Term: Increase workloads from initial exercise prescription for resistance, speed, and METs.;Short Term: Perform resistance training exercises routinely during rehab and add in resistance training at home;Long Term: Improve cardiorespiratory fitness, muscular endurance and strength as measured by increased METs and functional capacity ( )       Able to understand and use rate of perceived exertion (RPE) scale Yes       Intervention Provide education and explanation on how to use RPE scale       Expected Outcomes Short Term: Able to use RPE daily in rehab to express subjective intensity level;Long Term:  Able to use RPE to guide intensity level when exercising independently       Able to understand and use Dyspnea scale Yes        Intervention Provide education and explanation on how to use Dyspnea scale       Expected Outcomes Short Term: Able to use Dyspnea scale daily in rehab to express subjective sense of shortness of breath during exertion;Long Term: Able to use Dyspnea scale to guide intensity level when exercising independently       Knowledge and understanding of Target Heart Rate Range (THRR) Yes       Intervention Provide education and explanation of THRR including how the numbers were predicted and where they are located for reference       Expected Outcomes Short Term: Able to state/look up THRR;Long Term: Able to use THRR to govern intensity when exercising independently;Short Term: Able to use daily as guideline for intensity in rehab       Able to check pulse independently Yes       Intervention Provide education and demonstration on how to check pulse in carotid and radial arteries.;Review the importance of being able to check your own pulse for safety during independent exercise       Expected Outcomes Short Term: Able to explain why pulse checking is important during independent exercise       Understanding of Exercise Prescription Yes       Intervention Provide education, explanation, and written materials on patient's individual exercise prescription       Expected Outcomes Short Term: Able to explain program exercise prescription;Long Term: Able to explain home exercise prescription to exercise independently                Exercise Goals Re-Evaluation :  Exercise Goals Re-Evaluation     Row Name 08/05/22 1541 08/11/22 1526 08/26/22 1339 09/02/22 1131 09/04/22 1130     Exercise Goal Re-Evaluation   Exercise Goals Review Increase Physical Activity;Able to understand and use rate of perceived exertion (RPE) scale;Knowledge and understanding of Target Heart Rate Range (THRR);Understanding of Exercise Prescription;Increase Strength and Stamina;Able to check pulse independently Increase Physical  Activity;Increase Strength and Stamina;Understanding of Exercise Prescription Increase Physical Activity;Increase Strength and Stamina;Understanding of Exercise Prescription Increase Physical Activity Increase Physical Activity;Increase Strength and Stamina;Able to understand and use rate of perceived exertion (RPE) scale;Able to understand and use Dyspnea scale;Knowledge and understanding of Target Heart Rate Range (THRR);Able to check pulse independently;Understanding of Exercise Prescription   Comments Reviewed RPE scale, THR and program prescription with pt today.  Pt voiced understanding and was given a copy of goals to take home. Hai is off to a good start in the program. During his first two sessions of rehab he had an average MET level of 1.23 METs. He also was able to walk 3 laps on the track, and did  well with level 1 on the T4 nustep. He tolerated using 2 lb hand weights for resistance training as well. We will continue to monitor his progress in the program. Deklan continues to come to rehab and exercises at his initial exercise prescription. He did, however, increase to level 2 on the T4 Nustep. He paces himself when walking and averages 2-4 laps each time. He is not quite hitting his THR but should be a higher increase when workloads are increased. His RPEs are staying appropriate. We will continue to monitor. Bucky is showing interest in trying the treadmill and is open to increasing his levels. His tolerance is increasing. His sleep schedule limits his energy but he is willing to try to think about how he can increase his activity level at home Reviewed home exercise with pt today.  Pt plans to walk in house at home with walker for exercise.  He was also encouraged to try chair exercises as well.  Reviewed THR, pulse, RPE, sign and symptoms, pulse oximetery and when to call 911 or MD.  Also discussed weather considerations and indoor options.  Pt voiced understanding.   Expected Outcomes Short:  Use RPE daily to regulate intensity.  Long: Follow program prescription in THR. Short: Continue to follow current exercise prescription. Long: Continue to improve strength and stamina. Short: Increase laps on track by 1 extra Long: Continue to increase overall MET level and stamina Short: try the treadmill in class with supervision. Long: develop an exercise routine that works with his schedule Short: Start to add in walking at home Long: Conitnue to improve stamina    Row Name 09/09/22 1325 09/22/22 1518           Exercise Goal Re-Evaluation   Exercise Goals Review Increase Physical Activity;Increase Strength and Stamina;Understanding of Exercise Prescription Increase Physical Activity;Increase Strength and Stamina;Understanding of Exercise Prescription      Comments Merl is doing well in the program. He recently tried using the treadmill for the first time and was able to walk at a speed of 1 mph with no incline for around 3 minutes. He also improved from level 2 to level 3 on the T4. He has continued to use 2 lb hand weights for resistance training also. We will continue to monitor his progress in the program. Kohen is doing well in rehab, he is consistently coming to rehab. He is very limited on his exercise machines to work with and has stuck with the T4 and Track primarily. He did try out the Biostep at level 1 as well for a short time. He continues to reach about 4 laps in the hallway when walking, we will encourage patient to increase that to 5 next time. His RPEs are staying in appropriate range. There is a potential that he graduates early per his decision, and will let us know when he decides to do so. Will continue to monitor.      Expected Outcomes Short: Continue to build up stamina with walking. Long: Continue to improve strength and stamina. Short: Strive for 5 laps in the hallway Long: Continue to increase overall MET level and stamina               Discharge Exercise Prescription  (Final Exercise Prescription Changes):  Exercise Prescription Changes - 09/22/22 1500       Response to Exercise   Blood Pressure (Admit) 118/62    Blood Pressure (Exit) 118/60    Heart Rate (Admit) 69 bpm  Heart Rate (Exercise) 77 bpm    Heart Rate (Exit) 65 bpm    Rating of Perceived Exertion (Exercise) 13    Symptoms none    Duration Continue with 30 min of aerobic exercise without signs/symptoms of physical distress.    Intensity THRR unchanged      Progression   Progression Continue to progress workloads to maintain intensity without signs/symptoms of physical distress.    Average METs 1.51      Resistance Training   Training Prescription Yes    Weight 2 lb    Reps 10-15      Interval Training   Interval Training No      NuStep   Level 3    Minutes 15    METs 1.2      Biostep-RELP   Level 1    Minutes 5      Track   Laps 4   hallway   Minutes 15    METs 1.2      Home Exercise Plan   Plans to continue exercise at Home (comment)   walking, chair exercises   Frequency Add 2 additional days to program exercise sessions.    Initial Home Exercises Provided 09/04/22      Oxygen   Maintain Oxygen Saturation 88% or higher             Nutrition:  Target Goals: Understanding of nutrition guidelines, daily intake of sodium 1500mg , cholesterol 200mg , calories 30% from fat and 7% or less from saturated fats, daily to have 5 or more servings of fruits and vegetables.  Education: All About Nutrition: -Group instruction provided by verbal, written material, interactive activities, discussions, models, and posters to present general guidelines for heart healthy nutrition including fat, fiber, MyPlate, the role of sodium in heart healthy nutrition, utilization of the nutrition label, and utilization of this knowledge for meal planning. Follow up email sent as well. Written material given at graduation.   Biometrics:  Pre Biometrics - 08/03/22 1705       Pre  Biometrics   Waist Circumference --   deferred   Hip Circumference --   deferred            Post Biometrics - 09/23/22 1136        Post  Biometrics   Height 5\' 4"  (1.626 m)    Weight 142 lb 3.2 oz (64.5 kg)    BMI (Calculated) 24.4    Single Leg Stand --   not attempted            Nutrition Therapy Plan and Nutrition Goals:  Nutrition Therapy & Goals - 09/07/22 1107       Nutrition Therapy   Diet Heart healthy, low Na, T2Dm and dialysis MNT    Drug/Food Interactions Statins/Certain Fruits    Protein (specify units) 75g    Fiber 30 grams    Whole Grain Foods 3 servings    Saturated Fats 20 max. grams    Fruits and Vegetables 8 servings/day    Sodium 2 grams      Personal Nutrition Goals   Nutrition Goal ST: continue to follow recommendations of renal dietitian at HD [facility]: meet protein and calorie needs, maintain A1C <7    Comments 77 y.o. M admitted to cardiac rehab s/p CABG x 4.  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 Medications reviewed atorvastatin, vitamin D3, vitamin B12, novolog, levemir, synthroid, pantoprazole, miralax, rena-vite,  phoslo (calcium acetate). Labs reviewed. Kaziah and his wife came into see RD, however, Mckinnley did not speak much during consultation. His wife reports speaking with MD and dietitian at HD clinic regarding [facility]. Bastion's wife reports that he will normally eat 2 meals/day - she reports he does not eat much meat, but will eat eggs and yogurt; she will normally cook him a meal in the evening or he would like to take in food. His wife reports that he is no longer taking the phosphate binder with meals and has been told to monitor his potassium; discussed the importance of including fiber rich foods during the day for heart health while following guidelines set by renal dietitian based off of his lab results. She reports that he does not eat much usually and  has been eating this way for a long time - she reports his weight is stable. His wife also reports his BG doing well overall and that he does not have many BG lows anymore. Discussed the importance eating enough protein and calories during the day. Patient and wife report having no questions at this time.      Intervention Plan   Intervention Prescribe, educate and counsel regarding individualized specific dietary modifications aiming towards targeted core components such as weight, hypertension, lipid management, diabetes, heart failure and other comorbidities.    Expected Outcomes Short Term Goal: Understand basic principles of dietary content, such as calories, fat, sodium, cholesterol and nutrients.;Short Term Goal: A plan has been developed with personal nutrition goals set during dietitian appointment.;Long Term Goal: Adherence to prescribed nutrition plan.             Nutrition Assessments:  MEDIFICTS Score Key: ?70 Need to make dietary changes  40-70 Heart Healthy Diet ? 40 Therapeutic Level Cholesterol Diet  Flowsheet Row Cardiac Rehab from 09/07/2022 in [facility] Cardiac and Pulmonary Rehab  Picture Your Plate Total Score on Admission 44      Picture Your Plate Scores: <96 Unhealthy dietary pattern with much room for improvement. 41-50 Dietary pattern unlikely to meet recommendations for good health and room for improvement. 51-60 More healthful dietary pattern, with some room for improvement.  >60 Healthy dietary pattern, although there may be some specific behaviors that could be improved.    Nutrition Goals Re-Evaluation:  Nutrition Goals Re-Evaluation     Row Name 09/02/22 1128             Goals   Comment Shelley wants his wife to meet with the dietician, but scheduling with her has been difficult. He does talk with his dialysis staff about his renal diet. He mentions he also is aware of what foods affect his diabetes.       Expected Outcome Short: meet with dietician  if schedules allow; Long: independently manage renal diet.                Nutrition Goals Discharge (Final Nutrition Goals Re-Evaluation):  Nutrition Goals Re-Evaluation - 09/02/22 1128       Goals   Comment Stefone wants his wife to meet with the dietician, but scheduling with her has been difficult. He does talk with his dialysis staff about his renal diet. He mentions he also is aware of what foods affect his diabetes.    Expected Outcome Short: meet with dietician if schedules allow; Long: independently manage renal diet.             Psychosocial: Target Goals: Acknowledge presence or absence of significant depression and/or  stress, maximize coping skills, provide positive support system. Participant is able to verbalize types and ability to use techniques and skills needed for reducing stress and depression.   Education: Stress, Anxiety, and Depression - Group verbal and visual presentation to define topics covered.  Reviews how body is impacted by stress, anxiety, and depression.  Also discusses healthy ways to reduce stress and to treat/manage anxiety and depression.  Written material given at graduation.   Education: Sleep Hygiene -Provides group verbal and written instruction about how sleep can affect your health.  Define sleep hygiene, discuss sleep cycles and impact of sleep habits. Review good sleep hygiene tips.    Initial Review & Psychosocial Screening:  Initial Psych Review & Screening - 07/27/22 1553       Initial Review   Current issues with None Identified      Family Dynamics   Good Support System? Yes   Wife and Son            Quality of Life Scores:   Quality of Life - 08/03/22 1709       Quality of Life   Select --   deferred, patient unable to complete QOL questionaire.           Scores of 19 and below usually indicate a poorer quality of life in these areas.  A difference of  2-3 points is a clinically meaningful difference.  A  difference of 2-3 points in the total score of the Quality of Life Index has been associated with significant improvement in overall quality of life, self-image, physical symptoms, and general health in studies assessing change in quality of life.  PHQ-9: Review Flowsheet       06/19/2021 03/12/2020  Depression screen PHQ 2/9  Decreased Interest 0 0  Down, Depressed, Hopeless 0 0  PHQ - 2 Score 0 0   Interpretation of Total Score  Total Score Depression Severity:  1-4 = Minimal depression, 5-9 = Mild depression, 10-14 = Moderate depression, 15-19 = Moderately severe depression, 20-27 = Severe depression   Psychosocial Evaluation and Intervention:  Psychosocial Evaluation - 07/27/22 1605       Psychosocial Evaluation & Interventions   Interventions Encouraged to exercise with the program and follow exercise prescription;Relaxation education;Stress management education    Comments 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 He has no barriers for attending the program. He lives with his wife and has good support from his son "Willeen Cass" also who was on the phone call today. Son states he would like his father to have encourangement to complete program to obtain a more active lifestyle after bypass surgery, to increase strength, move around more independantly and potentially to be able to drive again. Cordarro does use a walker occasionally with ambulation. 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    Expected Outcomes Short: Attend cardiac rehab for education and exercise. Long:  Develop and maintain positive self care habits    Continue Psychosocial Services  Follow up required by staff             Psychosocial Re-Evaluation:  Psychosocial Re-Evaluation     Row Name 09/02/22 1124             Psychosocial Re-Evaluation   Current issues with Current Sleep Concerns       Comments Demir's sleep schedule has  been a stressor. He has a very hard time falling asleep  until early morning. Coming to class at 70 has kept him on a regular wake up schedule, but he is still struggling with falling asleep. He looks forward to his naps in the afternoon. He mentions his wife is a big help at home. He doesn't always feel up for coming to class, but his wife and he knows its for the best. It is hard to manage dialysis schedule on top of class schedule, leaving him with only Sunday free. He knows this is temporary and he wants to see it through.       Expected Outcomes Short: attend class consistenlty to maintain a schedule; limit sleep distractions at night. Long: maintain a positive sleep schedule.       Interventions Encouraged to attend Cardiac Rehabilitation for the exercise       Continue Psychosocial Services  Follow up required by staff                Psychosocial Discharge (Final Psychosocial Re-Evaluation):  Psychosocial Re-Evaluation - 09/02/22 1124       Psychosocial Re-Evaluation   Current issues with Current Sleep Concerns    Comments Caetano's sleep schedule has been a stressor. He has a very hard time falling asleep until early morning. Coming to class at 58 has kept him on a regular wake up schedule, but he is still struggling with falling asleep. He looks forward to his naps in the afternoon. He mentions his wife is a big help at home. He doesn't always feel up for coming to class, but his wife and he knows its for the best. It is hard to manage dialysis schedule on top of class schedule, leaving him with only Sunday free. He knows this is temporary and he wants to see it through.    Expected Outcomes Short: attend class consistenlty to maintain a schedule; limit sleep distractions at night. Long: maintain a positive sleep schedule.    Interventions Encouraged to attend Cardiac Rehabilitation for the exercise    Continue Psychosocial Services  Follow up required by staff              Vocational Rehabilitation: Provide vocational rehab assistance to qualifying candidates.   Vocational Rehab Evaluation & Intervention:  Vocational Rehab - 07/27/22 1605       Initial Vocational Rehab Evaluation & Intervention   Assessment shows need for Vocational Rehabilitation No             Education: Education Goals: Education classes will be provided on a variety of topics geared toward better understanding of heart health and risk factor modification. Participant will state understanding/return demonstration of topics presented as noted by education test scores.  Learning Barriers/Preferences:  Learning Barriers/Preferences - 07/27/22 1605       Learning Barriers/Preferences   Learning Barriers None    Learning Preferences None             General Cardiac Education Topics:  AED/CPR: - Group verbal and written instruction with the use of models to demonstrate the basic use of the AED with the basic ABC's of resuscitation.   Anatomy and Cardiac Procedures: - Group verbal and visual presentation and models provide information about basic cardiac anatomy and function. Reviews the testing methods done to diagnose heart disease and the outcomes of the test results. Describes the treatment choices: Medical Management, Angioplasty, or Coronary Bypass Surgery for treating various heart conditions including Myocardial Infarction, Angina, Valve Disease, and Cardiac Arrhythmias.  Written material given at graduation.  Medication Safety: - Group verbal and visual instruction to review commonly prescribed medications for heart and lung disease. Reviews the medication, class of the drug, and side effects. Includes the steps to properly store meds and maintain the prescription regimen.  Written material given at graduation.   Intimacy: - Group verbal instruction through game format to discuss how heart and lung disease can affect sexual intimacy. Written material given at  graduation..   Know Your Numbers and Heart Failure: - Group verbal and visual instruction to discuss disease risk factors for cardiac and pulmonary disease and treatment options.  Reviews associated critical values for Overweight/Obesity, Hypertension, Cholesterol, and Diabetes.  Discusses basics of heart failure: signs/symptoms and treatments.  Introduces Heart Failure Zone chart for action plan for heart failure.  Written material given at graduation.   Infection Prevention: - Provides verbal and written material to individual with discussion of infection control including proper hand washing and proper equipment cleaning during exercise session. Flowsheet Row Cardiac Rehab from 08/03/2022 in [facility] Cardiac and Pulmonary Rehab  Date 08/03/22  Educator [facility]  Instruction Review Code 1- Verbalizes Understanding       Falls Prevention: - Provides verbal and written material to individual with discussion of falls prevention and safety. Flowsheet Row Cardiac Rehab from 08/03/2022 in [facility] Cardiac and Pulmonary Rehab  Date 08/03/22  Educator [facility]  Instruction Review Code 1- Verbalizes Understanding       Other: -Provides group and verbal instruction on various topics (see comments)   Knowledge Questionnaire Score:  Knowledge Questionnaire Score - 08/03/22 1709       Knowledge Questionnaire Score   Pre Score deferred, Patient unable to complete knowledge questionaire.             Core Components/Risk Factors/Patient Goals at Admission:  Personal Goals and Risk Factors at Admission - 08/03/22 1710       Core Components/Risk Factors/Patient Goals on Admission    Weight Management Yes    Intervention Weight Management: Develop a combined nutrition and exercise program designed to reach desired caloric intake, while maintaining appropriate intake of nutrient and fiber, sodium and fats, and appropriate energy expenditure required for the weight goal.;Weight Management: Provide education  and appropriate resources to help participant work on and attain dietary goals.;Weight Management/Obesity: Establish reasonable short term and long term weight goals.    Admit Weight 142 lb (64.4 kg)    Goal Weight: Short Term 140 lb (63.5 kg)    Goal Weight: Long Term 140 lb (63.5 kg)    Expected Outcomes Short Term: Continue to assess and modify interventions until short term weight is achieved;Weight Maintenance: Understanding of the daily nutrition guidelines, which includes 25-35% calories from fat, 7% or less cal from saturated fats, less than 200mg  cholesterol, less than 1.5gm of sodium, & 5 or more servings of fruits and vegetables daily;Understanding recommendations for meals to include 15-35% energy as protein, 25-35% energy from fat, 35-60% energy from carbohydrates, less than 200mg  of dietary cholesterol, 20-35 gm of total fiber daily;Understanding of distribution of calorie intake throughout the day with the consumption of 4-5 meals/snacks;Long Term: Adherence to nutrition and physical activity/exercise program aimed toward attainment of established weight goal    Diabetes Yes    Intervention Provide education about signs/symptoms and action to take for hypo/hyperglycemia.;Provide education about proper nutrition, including hydration, and aerobic/resistive exercise prescription along with prescribed medications to achieve blood glucose in normal ranges: Fasting glucose 65-99 mg/dL    Expected Outcomes Short Term: Participant verbalizes  understanding of the signs/symptoms and immediate care of hyper/hypoglycemia, proper foot care and importance of medication, aerobic/resistive exercise and nutrition plan for blood glucose control.;Long Term: Attainment of HbA1C < 7%.    Heart Failure Yes    Intervention Provide a combined exercise and nutrition program that is supplemented with education, support and counseling about heart failure. Directed toward relieving symptoms such as shortness of breath,  decreased exercise tolerance, and extremity edema.    Expected Outcomes Improve functional capacity of life;Short term: Attendance in program 2-3 days a week with increased exercise capacity. Reported lower sodium intake. Reported increased fruit and vegetable intake. Reports medication compliance.;Short term: Daily weights obtained and reported for increase. Utilizing diuretic protocols set by physician.;Long term: Adoption of self-care skills and reduction of barriers for early signs and symptoms recognition and intervention leading to self-care maintenance.    Hypertension Yes    Intervention Provide education on lifestyle modifcations including regular physical activity/exercise, weight management, moderate sodium restriction and increased consumption of fresh fruit, vegetables, and low fat dairy, alcohol moderation, and smoking cessation.;Monitor prescription use compliance.    Expected Outcomes Short Term: Continued assessment and intervention until BP is < 140/73mm HG in hypertensive participants. < 130/43mm HG in hypertensive participants with diabetes, heart failure or chronic kidney disease.;Long Term: Maintenance of blood pressure at goal levels.    Lipids Yes    Intervention Provide education and support for participant on nutrition & aerobic/resistive exercise along with prescribed medications to achieve LDL 70mg , HDL >40mg .    Expected Outcomes Short Term: Participant states understanding of desired cholesterol values and is compliant with medications prescribed. Participant is following exercise prescription and nutrition guidelines.;Long Term: Cholesterol controlled with medications as prescribed, with individualized exercise RX and with personalized nutrition plan. Value goals: LDL < 70mg , HDL > 40 mg.             Education:Diabetes - Individual verbal and written instruction to review signs/symptoms of diabetes, desired ranges of glucose level fasting, after meals and with  exercise. Acknowledge that pre and post exercise glucose checks will be done for 3 sessions at entry of program. Flowsheet Row Cardiac Rehab from 08/03/2022 in [facility] Cardiac and Pulmonary Rehab  Date 08/03/22  Educator [facility]  Instruction Review Code 1- Verbalizes Understanding       Core Components/Risk Factors/Patient Goals Review:   Goals and Risk Factor Review     Row Name 09/02/22 1118             Core Components/Risk Factors/Patient Goals Review   Personal Goals Review Diabetes;Heart Failure;Weight Management/Obesity       Review Andrae has been checking his sugar in the morning and reports it is going well most days. He is attending dialysis regularly and is working on managing his heart failure symptoms. He has noticed a slight increase in stamina and wants to work on his strength. He has met his weight goal and wants to maintain his weight which he knows he needs to manage his fluid balance.       Expected Outcomes Short: maintain weight by managing fluid intake and eating consistenly. Long:attend the program consistenly to help manage his heart failure and diabetes.                Core Components/Risk Factors/Patient Goals at Discharge (Final Review):   Goals and Risk Factor Review - 09/02/22 1118       Core Components/Risk Factors/Patient Goals Review   Personal Goals Review Diabetes;Heart Failure;Weight Management/Obesity  Review Oaks has been checking his sugar in the morning and reports it is going well most days. He is attending dialysis regularly and is working on managing his heart failure symptoms. He has noticed a slight increase in stamina and wants to work on his strength. He has met his weight goal and wants to maintain his weight which he knows he needs to manage his fluid balance.    Expected Outcomes Short: maintain weight by managing fluid intake and eating consistenly. Long:attend the program consistenly to help manage his heart failure and diabetes.              ITP Comments:  ITP Comments     Row Name 07/27/22 1613 08/03/22 1656 08/05/22 1541 08/12/22 0924 09/09/22 1345   ITP Comments Initial phone call completed. Diagnosis can be found in [facility] 12/5. EP orientation scheduled for 3/11. Completed and gym orientation. Initial ITP created and sent for review to Dr. Bethann Punches, Medical Director. First full day of exercise!  Patient was oriented to gym and equipment including functions, settings, policies, and procedures.  Patient's individual exercise prescription and treatment plan were reviewed.  All starting workloads were established based on the results of the 6 minute walk test done at initial orientation visit.  The plan for exercise progression was also introduced and progression will be customized based on patient's performance and goals.' 30 Day review completed. Medical Director ITP review done, changes made as directed, and signed approval by Medical Director.     new to program 30 day review completed. ITP sent to Dr. Bethann Punches, Medical Director of Cardiac Rehab. Continue with ITP unless changes are made by physician.    Row Name 09/25/22 1146 10/07/22 0928         ITP Comments Weston graduated today from  rehab with 23 sessions completed.  Details of the patient's exercise prescription and what He needs to do in order to continue the prescription and progress were discussed with patient.  Patient was given a copy of prescription and goals.  Patient verbalized understanding.  Robertlee plans to continue to exercise by walking and joining the [employer] after his trip to [place]. 30 Day review completed. Medical Director ITP review done, changes made as directed, and signed approval by Medical Director.    wife did not want Jersen to finish early, they are vacation and we will wait until he returns to see if he will complete the program instead of early discharge               Comments:

## 2022-10-08 DIAGNOSIS — N186 End stage renal disease: Secondary | ICD-10-CM | POA: Diagnosis not present

## 2022-10-08 DIAGNOSIS — N2581 Secondary hyperparathyroidism of renal origin: Secondary | ICD-10-CM | POA: Diagnosis not present

## 2022-10-08 DIAGNOSIS — Z992 Dependence on renal dialysis: Secondary | ICD-10-CM | POA: Diagnosis not present

## 2022-10-10 DIAGNOSIS — N186 End stage renal disease: Secondary | ICD-10-CM | POA: Diagnosis not present

## 2022-10-10 DIAGNOSIS — N2581 Secondary hyperparathyroidism of renal origin: Secondary | ICD-10-CM | POA: Diagnosis not present

## 2022-10-10 DIAGNOSIS — Z992 Dependence on renal dialysis: Secondary | ICD-10-CM | POA: Diagnosis not present

## 2022-10-13 DIAGNOSIS — N186 End stage renal disease: Secondary | ICD-10-CM | POA: Diagnosis not present

## 2022-10-13 DIAGNOSIS — Z992 Dependence on renal dialysis: Secondary | ICD-10-CM | POA: Diagnosis not present

## 2022-10-13 DIAGNOSIS — N2581 Secondary hyperparathyroidism of renal origin: Secondary | ICD-10-CM | POA: Diagnosis not present

## 2022-10-14 ENCOUNTER — Encounter: Payer: Self-pay | Admitting: *Deleted

## 2022-10-15 DIAGNOSIS — N2581 Secondary hyperparathyroidism of renal origin: Secondary | ICD-10-CM | POA: Diagnosis not present

## 2022-10-15 DIAGNOSIS — N186 End stage renal disease: Secondary | ICD-10-CM | POA: Diagnosis not present

## 2022-10-15 DIAGNOSIS — Z992 Dependence on renal dialysis: Secondary | ICD-10-CM | POA: Diagnosis not present

## 2022-10-17 DIAGNOSIS — N2581 Secondary hyperparathyroidism of renal origin: Secondary | ICD-10-CM | POA: Diagnosis not present

## 2022-10-17 DIAGNOSIS — Z992 Dependence on renal dialysis: Secondary | ICD-10-CM | POA: Diagnosis not present

## 2022-10-17 DIAGNOSIS — N186 End stage renal disease: Secondary | ICD-10-CM | POA: Diagnosis not present

## 2022-10-20 DIAGNOSIS — N186 End stage renal disease: Secondary | ICD-10-CM | POA: Diagnosis not present

## 2022-10-20 DIAGNOSIS — Z992 Dependence on renal dialysis: Secondary | ICD-10-CM | POA: Diagnosis not present

## 2022-10-20 DIAGNOSIS — N2581 Secondary hyperparathyroidism of renal origin: Secondary | ICD-10-CM | POA: Diagnosis not present

## 2022-10-21 ENCOUNTER — Encounter: Payer: Medicare HMO | Admitting: *Deleted

## 2022-10-22 DIAGNOSIS — N2581 Secondary hyperparathyroidism of renal origin: Secondary | ICD-10-CM | POA: Diagnosis not present

## 2022-10-22 DIAGNOSIS — N186 End stage renal disease: Secondary | ICD-10-CM | POA: Diagnosis not present

## 2022-10-22 DIAGNOSIS — Z992 Dependence on renal dialysis: Secondary | ICD-10-CM | POA: Diagnosis not present

## 2022-10-23 ENCOUNTER — Ambulatory Visit: Payer: Self-pay | Admitting: *Deleted

## 2022-10-23 DIAGNOSIS — N186 End stage renal disease: Secondary | ICD-10-CM | POA: Diagnosis not present

## 2022-10-23 DIAGNOSIS — E1122 Type 2 diabetes mellitus with diabetic chronic kidney disease: Secondary | ICD-10-CM | POA: Diagnosis not present

## 2022-10-23 DIAGNOSIS — Z992 Dependence on renal dialysis: Secondary | ICD-10-CM | POA: Diagnosis not present

## 2022-10-23 NOTE — Patient Outreach (Signed)
  Care Coordination   Follow Up Visit Note   10/23/2022 Name: Joseph Mcpherson MRN: [id number] DOB: [birth year]  Joseph Mcpherson is a 77 y.o. year old male who sees Corky Downs, MD for primary care. I spoke with  Shelly Flatten by phone today.  What matters to the patients health and wellness today?  Patient state he is out of town, does not have time to talk.      Goals Addressed             This Visit's Progress    Care coordination activities/ Post hospital follow up / management of health conditions   On track    Interventions Today    Flowsheet Row Most Recent Value  Chronic Disease   Chronic disease during today's visit Other  [Post CABG]  General Interventions   General Interventions Discussed/Reviewed General Interventions Reviewed, Doctor Visits  [Discussed cardiac rehab, state he has not been to rehab because he was out of town.  Next rehab session scheduled for 6/3, patient stating he will need to reschedule.]  Doctor Visits Discussed/Reviewed Doctor Visits Reviewed      Attempted to contact wife to verify information and complete reassessment, call unsuccessful, message left.          SDOH assessments and interventions completed:  No     Care Coordination Interventions:  Yes, provided   Follow up plan: Follow up call scheduled for 6/12    Encounter Outcome:  Pt. Visit Completed   Kemper Durie, RN, MSN, [facility] Care Management Care Management Coordinator [phone]

## 2022-10-24 DIAGNOSIS — Z992 Dependence on renal dialysis: Secondary | ICD-10-CM | POA: Diagnosis not present

## 2022-10-24 DIAGNOSIS — N186 End stage renal disease: Secondary | ICD-10-CM | POA: Diagnosis not present

## 2022-10-24 DIAGNOSIS — N2581 Secondary hyperparathyroidism of renal origin: Secondary | ICD-10-CM | POA: Diagnosis not present

## 2022-10-26 ENCOUNTER — Encounter: Payer: Self-pay | Admitting: *Deleted

## 2022-10-26 ENCOUNTER — Encounter: Payer: Medicare HMO | Admitting: *Deleted

## 2022-10-26 DIAGNOSIS — Z951 Presence of aortocoronary bypass graft: Secondary | ICD-10-CM

## 2022-10-26 NOTE — Progress Notes (Signed)
Cardiac Individual Treatment Plan  Patient Details  Name: Joseph Mcpherson MRN: [id number] Date of Birth: 1945/06/23 Referring Provider:   Flowsheet Row Cardiac Rehab from 08/03/2022 in [facility] Cardiac and Pulmonary Rehab  Referring Provider Dr. Darrold Junker       Initial Encounter Date:  Flowsheet Row Cardiac Rehab from 08/03/2022 in [facility] Cardiac and Pulmonary Rehab  Date 08/03/22       Visit Diagnosis: S/P CABG x 4  Patient's Home Medications on Admission:  Current Outpatient Medications:    albuterol (PROVENTIL) (2.5 MG/3ML) 0.083% nebulizer solution, Take 3 mLs (2.5 mg total) by nebulization every 6 (six) hours as needed for wheezing Joseph shortness of breath., Disp: 150 mL, Rfl: 1   amLODipine (NORVASC) 10 MG tablet, Take 1 tablet (10 mg total) by mouth daily., Disp: 30 tablet, Rfl: 3   aspirin EC 81 MG tablet, Take 81 mg by mouth daily. Swallow whole., Disp: , Rfl:    atorvastatin (LIPITOR) 40 MG tablet, Take 1 tablet (40 mg total) by mouth daily., Disp: 90 tablet, Rfl: 1   B Complex-C-Folic Acid (RENA-VITE RX) 1 MG TABS, Take 1 tablet by mouth daily. , Disp: , Rfl:    BD PEN NEEDLE NANO 2ND GEN 32G X 4 MM MISC, USE AS DIRECTED, Disp: 300 each, Rfl: 1   calcium acetate (PHOSLO) 667 MG capsule, TAKE 2 CAPSULES BY MOUTH 3 TIMES A DAY WITH MEAL, Disp: 540 capsule, Rfl: 1   Cholecalciferol (VITAMIN D3) 5000 units TABS, Take 5,000 Units by mouth daily. , Disp: , Rfl:    clopidogrel (PLAVIX) 75 MG tablet, Take 1 tablet (75 mg total) by mouth daily. (Patient not taking: Reported on 07/27/2022), Disp: 90 tablet, Rfl: 1   Continuous Blood Gluc Sensor (FREESTYLE LIBRE 14 DAY SENSOR) MISC, APPLY EVERY 14 (FOURTEEN) DAYS., Disp: 90 each, Rfl: 1   donepezil (ARICEPT) 10 MG tablet, TAKE 1 TABLET BY MOUTH EVERY DAY, Disp: 90 tablet, Rfl: 3   hydrALAZINE (APRESOLINE) 25 MG tablet, Take 25 mg by mouth 2 (two) times daily. (Patient not taking: Reported on 06/03/2022), Disp: , Rfl:    insulin detemir (LEVEMIR) 100  UNIT/ML injection, Inject 0.15 mLs (15 Units total) into the skin at bedtime. (Patient taking differently: Inject 14 Units into the skin at bedtime.), Disp: 10 mL, Rfl: 11   ipratropium-albuterol (DUONEB) 0.5-2.5 (3) MG/3ML SOLN, Take 3 mLs by nebulization every 6 (six) hours as needed., Disp: 120 mL, Rfl: 4   levETIRAcetam (KEPPRA) 250 MG tablet, Take 1 tablet (250 mg total) by mouth 3 (three) times a week. On Tuesday ,Thursday and Saturday after Hemodialysis (Patient not taking: Reported on 07/27/2022), Disp: 30 tablet, Rfl: 0   levETIRAcetam (KEPPRA) 500 MG tablet, Take 1 tablet (500 mg total) by mouth daily. (Patient not taking: Reported on 07/27/2022), Disp: 30 tablet, Rfl: 1   levothyroxine (SYNTHROID) 137 MCG tablet, TAKE 1 TABLET BY MOUTH EVERY DAY, Disp: 90 tablet, Rfl: 3   losartan (COZAAR) 100 MG tablet, Take 1 tablet (100 mg total) by mouth every evening., Disp: 30 tablet, Rfl: 3   metoprolol succinate (TOPROL-XL) 100 MG 24 hr tablet, Take 1 tablet (100 mg total) by mouth daily. Take with Joseph immediately following a meal. (Patient not taking: Reported on 07/27/2022), Disp: , Rfl:    multivitamin (RENA-VIT) TABS tablet, Take 1 tablet by mouth daily., Disp: , Rfl: 0   pantoprazole (PROTONIX) 40 MG tablet, TAKE 1 TABLET BY MOUTH EVERY DAY, Disp: 90 tablet, Rfl: 3   sevelamer  carbonate (RENVELA) 2.4 g PACK, Take 2.4 g by mouth daily., Disp: 90 each, Rfl: 3   sitaGLIPtin (JANUVIA) 50 MG tablet, Take 50 mg by mouth every evening.  (Patient not taking: Reported on 07/27/2022), Disp: , Rfl:   Past Medical History: Past Medical History:  Diagnosis Date   Clotting disorder (HCC)    per daughter, "when patient travels, his feet swell"   Diabetes mellitus without complication (HCC)    type II   Diabetic eye exam (HCC) 12/2019   ESRD (end stage renal disease) (HCC)    MWF- [facility]   GERD (gastroesophageal reflux disease)    [name] (hard of hearing)    Hyperlipidemia    Hypertension    Hypothyroidism     Thyroid disease     Tobacco Use: Social History   Tobacco Use  Smoking Status Never  Smokeless Tobacco Former    Labs: Review Flowsheet  More data exists      Latest Ref Rng & Units 02/12/2021 01/18/2022 04/11/2022 04/13/2022 04/15/2022  Labs for ITP Cardiac and Pulmonary Rehab  Cholestrol 0 - 200 mg/dL - - - - 454   LDL (calc) 0 - 99 mg/dL - - - - 66   HDL-C >09 mg/dL - - - - 42   Trlycerides <150 mg/dL - - - - 60   Hemoglobin A1c 4.8 - 5.6 % 7.0  8.1  5.9  - -  Bicarbonate 20.0 - 28.0 mmol/L - - - 28.5  -  O2 Saturation % - - - 92.2  -     Exercise Target Goals: Exercise Program Goal: Individual exercise prescription set using results from initial 6 min walk test and THRR while considering  patient's activity barriers and safety.   Exercise Prescription Goal: Initial exercise prescription builds to 30-45 minutes a day of aerobic activity, 2-3 days per week.  Home exercise guidelines will be given to patient during program as part of exercise prescription that the participant will acknowledge.   Education: Aerobic Exercise: - Group verbal and visual presentation on the components of exercise prescription. Introduces F.I.T.T principle from ACSM for exercise prescriptions.  Reviews F.I.T.T. principles of aerobic exercise including progression. Written material given at graduation.   Education: Resistance Exercise: - Group verbal and visual presentation on the components of exercise prescription. Introduces F.I.T.T principle from ACSM for exercise prescriptions  Reviews F.I.T.T. principles of resistance exercise including progression. Written material given at graduation.    Education: Exercise & Equipment Safety: - Individual verbal instruction and demonstration of equipment use and safety with use of the equipment. Flowsheet Row Cardiac Rehab from 08/03/2022 in [facility] Cardiac and Pulmonary Rehab  Date 08/03/22  Educator [facility]  Instruction Review Code 1- Verbalizes  Understanding       Education: Exercise Physiology & General Exercise Guidelines: - Group verbal and written instruction with models to review the exercise physiology of the cardiovascular system and associated critical values. Provides general exercise guidelines with specific guidelines to those with heart Joseph lung disease.    Education: Flexibility, Balance, Mind/Body Relaxation: - Group verbal and visual presentation with interactive activity on the components of exercise prescription. Introduces F.I.T.T principle from ACSM for exercise prescriptions. Reviews F.I.T.T. principles of flexibility and balance exercise training including progression. Also discusses the mind body connection.  Reviews various relaxation techniques to help reduce and manage stress (i.e. Deep breathing, progressive muscle relaxation, and visualization). Balance handout provided to take home. Written material given at graduation.   Activity Barriers & Risk Stratification:  Activity Barriers & Cardiac Risk Stratification - 08/03/22 1700       Activity Barriers & Cardiac Risk Stratification   Activity Barriers None;Assistive Device    Cardiac Risk Stratification High             6 Minute Walk:  6 Minute Walk     Row Name 08/03/22 1657 09/23/22 1134       6 Minute Walk   Phase Initial Discharge    Distance 480 feet 500 feet    Distance % Change -- 4.2 %    Distance Feet Change -- 20 ft    Walk Time 4 minutes 5.25 minutes    # of Rest Breaks 1  stopped test at 4 min mark due to being tired. 1    MPH 1.36 1.08    METS 1.04 1.22    RPE 13 13    Perceived Dyspnea  0 0    VO2 Peak 3.36 4.29    Symptoms No No    Resting HR 60 bpm 74 bpm    Resting BP 110/70 134/72    Resting Oxygen Saturation  99 % 95 %    Exercise Oxygen Saturation  during 6 min walk 100 % 92 %    Max Ex. HR 80 bpm 82 bpm    Max Ex. BP 128/62 150/74    2 Minute Post BP 118/62 --             Oxygen Initial  Assessment:   Oxygen Re-Evaluation:   Oxygen Discharge (Final Oxygen Re-Evaluation):   Initial Exercise Prescription:  Initial Exercise Prescription - 08/03/22 1700       Date of Initial Exercise RX and Referring Provider   Date 08/03/22    Referring Provider Dr. Darrold Junker      Oxygen   Maintain Oxygen Saturation 88% Joseph higher      Recumbant Bike   Level 1    RPM 50    Watts 10    Minutes 15      NuStep   Level 1    SPM 80    Minutes 15    METs 1.04      T5 Nustep   Level 1    SPM 80    Minutes 15    METs 1.04      Track   Laps 5    Minutes 15    METs 1.27      Prescription Details   Frequency (times per week) 3    Duration Progress to 30 minutes of continuous aerobic without signs/symptoms of physical distress      Intensity   THRR 40-80% of Max Heartrate 93-127    Ratings of Perceived Exertion 11-13    Perceived Dyspnea 0-4      Progression   Progression Continue to progress workloads to maintain intensity without signs/symptoms of physical distress.      Resistance Training   Training Prescription Yes    Weight 2    Reps 10-15             Perform Capillary Blood Glucose checks as needed.  Exercise Prescription Changes:   Exercise Prescription Changes     Row Name 08/03/22 1700 08/11/22 1500 08/26/22 1300 09/04/22 1100 09/09/22 1300     Response to Exercise   Blood Pressure (Admit) 110/70 108/62 120/68 -- 126/74   Blood Pressure (Exercise) 128/62 124/62 112/58 -- 142/72   Blood Pressure (Exit) 118/62 110/60 110/58 -- 100/56   Heart  Rate (Admit) 60 bpm 73 bpm 66 bpm -- 66 bpm   Heart Rate (Exercise) 80 bpm 78 bpm 73 bpm -- 83 bpm   Heart Rate (Exit) 60 bpm 61 bpm 63 bpm -- 65 bpm   Oxygen Saturation (Admit) 99 % -- -- -- --   Oxygen Saturation (Exercise) 100 % -- -- -- --   Oxygen Saturation (Exit) 100 % -- -- -- --   Rating of Perceived Exertion (Exercise) 13 13 13  -- 13   Perceived Dyspnea (Exercise) 0 -- -- -- --   Symptoms  none none none -- none   Comments 6 MWT results First two days of exercise -- -- --   Duration -- Progress to 30 minutes of  aerobic without signs/symptoms of physical distress Progress to 30 minutes of  aerobic without signs/symptoms of physical distress -- Progress to 30 minutes of  aerobic without signs/symptoms of physical distress   Intensity -- THRR unchanged THRR unchanged -- THRR unchanged     Progression   Progression -- Continue to progress workloads to maintain intensity without signs/symptoms of physical distress. Continue to progress workloads to maintain intensity without signs/symptoms of physical distress. -- Continue to progress workloads to maintain intensity without signs/symptoms of physical distress.   Average METs -- 1.23 1.15 -- 1.39     Resistance Training   Training Prescription -- Yes Yes -- Yes   Weight -- 2 lb 2 lb -- 2 lb   Reps -- 10-15 10-15 -- 10-15     Interval Training   Interval Training -- No No -- No     Treadmill   MPH -- -- -- -- 1   Grade -- -- -- -- 0   Minutes -- -- -- -- 15   METs -- -- -- -- 15     NuStep   Level -- 1 2 -- 3   Minutes -- 15 15 -- 15   METs -- 1.3 1.1 -- 2     Biostep-RELP   Level -- -- 1 -- 1   Minutes -- -- 15 -- 15   METs -- -- 1 -- 1     Track   Laps -- 3 4  hallway -- 2  hallway   Minutes -- 15 15 -- 15   METs -- 1.16 1.2 -- 1.1     Home Exercise Plan   Plans to continue exercise at -- -- -- Home (comment)  walking, chair exercises Home (comment)  walking, chair exercises   Frequency -- -- -- Add 2 additional days to program exercise sessions. Add 2 additional days to program exercise sessions.   Initial Home Exercises Provided -- -- -- 09/04/22 09/04/22     Oxygen   Maintain Oxygen Saturation -- 88% Joseph higher 88% Joseph higher -- 88% Joseph higher    Row Name 09/22/22 1500             Response to Exercise   Blood Pressure (Admit) 118/62       Blood Pressure (Exit) 118/60       Heart Rate (Admit) 69 bpm        Heart Rate (Exercise) 77 bpm       Heart Rate (Exit) 65 bpm       Rating of Perceived Exertion (Exercise) 13       Symptoms none       Duration Continue with 30 min of aerobic exercise without signs/symptoms of physical distress.  Intensity THRR unchanged         Progression   Progression Continue to progress workloads to maintain intensity without signs/symptoms of physical distress.       Average METs 1.51         Resistance Training   Training Prescription Yes       Weight 2 lb       Reps 10-15         Interval Training   Interval Training No         NuStep   Level 3       Minutes 15       METs 1.2         Biostep-RELP   Level 1       Minutes 5         Track   Laps 4  hallway       Minutes 15       METs 1.2         Home Exercise Plan   Plans to continue exercise at Home (comment)  walking, chair exercises       Frequency Add 2 additional days to program exercise sessions.       Initial Home Exercises Provided 09/04/22         Oxygen   Maintain Oxygen Saturation 88% Joseph higher                Exercise Comments:   Exercise Comments     Row Name 08/05/22 1541 09/25/22 1144         Exercise Comments First full day of exercise!  Patient was oriented to gym and equipment including functions, settings, policies, and procedures.  Patient's individual exercise prescription and treatment plan were reviewed.  All starting workloads were established based on the results of the 6 minute walk test done at initial orientation visit.  The plan for exercise progression was also introduced and progression will be customized based on patient's performance and goals.' Joseph Mcpherson graduated today from  rehab with 23 sessions completed.  Details of the patient's exercise prescription and what He needs to do in order to continue the prescription and progress were discussed with patient.  Patient was given a copy of prescription and goals.  Patient verbalized understanding.  Joseph Mcpherson  plans to continue to exercise by walking and joining the [facility] after his trip to [place].               Exercise Goals and Review:   Exercise Goals     Row Name 08/03/22 1704             Exercise Goals   Increase Physical Activity Yes       Intervention Provide advice, education, support and counseling about physical activity/exercise needs.;Develop an individualized exercise prescription for aerobic and resistive training based on initial evaluation findings, risk stratification, comorbidities and participant's personal goals.       Expected Outcomes Short Term: Attend rehab on a regular basis to increase amount of physical activity.;Long Term: Add in home exercise to make exercise part of routine and to increase amount of physical activity.;Long Term: Exercising regularly at least 3-5 days a week.       Increase Strength and Stamina Yes       Intervention Provide advice, education, support and counseling about physical activity/exercise needs.;Develop an individualized exercise prescription for aerobic and resistive training based on initial evaluation findings, risk stratification, comorbidities and participant's personal goals.  Expected Outcomes Short Term: Increase workloads from initial exercise prescription for resistance, speed, and METs.;Short Term: Perform resistance training exercises routinely during rehab and add in resistance training at home;Long Term: Improve cardiorespiratory fitness, muscular endurance and strength as measured by increased METs and functional capacity ( )       Able to understand and use rate of perceived exertion (RPE) scale Yes       Intervention Provide education and explanation on how to use RPE scale       Expected Outcomes Short Term: Able to use RPE daily in rehab to express subjective intensity level;Long Term:  Able to use RPE to guide intensity level when exercising independently       Able to understand and use Dyspnea scale Yes        Intervention Provide education and explanation on how to use Dyspnea scale       Expected Outcomes Short Term: Able to use Dyspnea scale daily in rehab to express subjective sense of shortness of breath during exertion;Long Term: Able to use Dyspnea scale to guide intensity level when exercising independently       Knowledge and understanding of Target Heart Rate Range (THRR) Yes       Intervention Provide education and explanation of THRR including how the numbers were predicted and where they are located for reference       Expected Outcomes Short Term: Able to state/look up THRR;Long Term: Able to use THRR to govern intensity when exercising independently;Short Term: Able to use daily as guideline for intensity in rehab       Able to check pulse independently Yes       Intervention Provide education and demonstration on how to check pulse in carotid and radial arteries.;Review the importance of being able to check your own pulse for safety during independent exercise       Expected Outcomes Short Term: Able to explain why pulse checking is important during independent exercise       Understanding of Exercise Prescription Yes       Intervention Provide education, explanation, and written materials on patient's individual exercise prescription       Expected Outcomes Short Term: Able to explain program exercise prescription;Long Term: Able to explain home exercise prescription to exercise independently                Exercise Goals Re-Evaluation :  Exercise Goals Re-Evaluation     Row Name 08/05/22 1541 08/11/22 1526 08/26/22 1339 09/02/22 1131 09/04/22 1130     Exercise Goal Re-Evaluation   Exercise Goals Review Increase Physical Activity;Able to understand and use rate of perceived exertion (RPE) scale;Knowledge and understanding of Target Heart Rate Range (THRR);Understanding of Exercise Prescription;Increase Strength and Stamina;Able to check pulse independently Increase Physical  Activity;Increase Strength and Stamina;Understanding of Exercise Prescription Increase Physical Activity;Increase Strength and Stamina;Understanding of Exercise Prescription Increase Physical Activity Increase Physical Activity;Increase Strength and Stamina;Able to understand and use rate of perceived exertion (RPE) scale;Able to understand and use Dyspnea scale;Knowledge and understanding of Target Heart Rate Range (THRR);Able to check pulse independently;Understanding of Exercise Prescription   Comments Reviewed RPE scale, THR and program prescription with pt today.  Pt voiced understanding and was given a copy of goals to take home. Joseph Mcpherson is off to a good start in the program. During his first two sessions of rehab he had an average MET level of 1.23 METs. He also was able to walk 3 laps on the track, and did  well with level 1 on the T4 nustep. He tolerated using 2 lb hand weights for resistance training as well. We will continue to monitor his progress in the program. Joseph Mcpherson to come to rehab and exercises at his initial exercise prescription. He did, however, increase to level 2 on the T4 Nustep. He paces himself when walking and averages 2-4 laps each time. He is not quite hitting his THR but should be a higher increase when workloads are increased. His RPEs are staying appropriate. We will continue to monitor. Joseph Mcpherson is showing interest in trying the treadmill and is open to increasing his levels. His tolerance is increasing. His sleep schedule limits his energy but he is willing to try to think about how he can increase his activity level at home Reviewed home exercise with pt today.  Pt plans to walk in house at home with walker for exercise.  He was also encouraged to try chair exercises as well.  Reviewed THR, pulse, RPE, sign and symptoms, pulse oximetery and when to call 911 or MD.  Also discussed weather considerations and indoor options.  Pt voiced understanding.   Expected Outcomes Short:  Use RPE daily to regulate intensity.  Long: Follow program prescription in THR. Short: Continue to follow current exercise prescription. Long: Continue to improve strength and stamina. Short: Increase laps on track by 1 extra Long: Continue to increase overall MET level and stamina Short: try the treadmill in class with supervision. Long: develop an exercise routine that works with his schedule Short: Start to add in walking at home Long: Conitnue to improve stamina    Row Name 09/09/22 1325 09/22/22 1518           Exercise Goal Re-Evaluation   Exercise Goals Review Increase Physical Activity;Increase Strength and Stamina;Understanding of Exercise Prescription Increase Physical Activity;Increase Strength and Stamina;Understanding of Exercise Prescription      Comments Joseph Mcpherson is doing well in the program. He recently tried using the treadmill for the first time and was able to walk at a speed of 1 mph with no incline for around 3 minutes. He also improved from level 2 to level 3 on the T4. He has continued to use 2 lb hand weights for resistance training also. We will continue to monitor his progress in the program. Joseph Mcpherson is doing well in rehab, he is consistently coming to rehab. He is very limited on his exercise machines to work with and has stuck with the T4 and Track primarily. He did try out the Biostep at level 1 as well for a short time. He Mcpherson to reach about 4 laps in the hallway when walking, we will encourage patient to increase that to 5 next time. His RPEs are staying in appropriate range. There is a potential that he graduates early per his decision, and will let us know when he decides to do so. Will continue to monitor.      Expected Outcomes Short: Continue to build up stamina with walking. Long: Continue to improve strength and stamina. Short: Strive for 5 laps in the hallway Long: Continue to increase overall MET level and stamina               Discharge Exercise Prescription  (Final Exercise Prescription Changes):  Exercise Prescription Changes - 09/22/22 1500       Response to Exercise   Blood Pressure (Admit) 118/62    Blood Pressure (Exit) 118/60    Heart Rate (Admit) 69 bpm  Heart Rate (Exercise) 77 bpm    Heart Rate (Exit) 65 bpm    Rating of Perceived Exertion (Exercise) 13    Symptoms none    Duration Continue with 30 min of aerobic exercise without signs/symptoms of physical distress.    Intensity THRR unchanged      Progression   Progression Continue to progress workloads to maintain intensity without signs/symptoms of physical distress.    Average METs 1.51      Resistance Training   Training Prescription Yes    Weight 2 lb    Reps 10-15      Interval Training   Interval Training No      NuStep   Level 3    Minutes 15    METs 1.2      Biostep-RELP   Level 1    Minutes 5      Track   Laps 4   hallway   Minutes 15    METs 1.2      Home Exercise Plan   Plans to continue exercise at Home (comment)   walking, chair exercises   Frequency Add 2 additional days to program exercise sessions.    Initial Home Exercises Provided 09/04/22      Oxygen   Maintain Oxygen Saturation 88% Joseph higher             Nutrition:  Target Goals: Understanding of nutrition guidelines, daily intake of sodium 1500mg , cholesterol 200mg , calories 30% from fat and 7% Joseph less from saturated fats, daily to have 5 Joseph more servings of fruits and vegetables.  Education: All About Nutrition: -Group instruction provided by verbal, written material, interactive activities, discussions, models, and posters to present general guidelines for heart healthy nutrition including fat, fiber, MyPlate, the role of sodium in heart healthy nutrition, utilization of the nutrition label, and utilization of this knowledge for meal planning. Follow up email sent as well. Written material given at graduation.   Biometrics:  Pre Biometrics - 08/03/22 1705       Pre  Biometrics   Waist Circumference --   deferred   Hip Circumference --   deferred            Post Biometrics - 09/23/22 1136        Post  Biometrics   Height 5\' 4"  (1.626 m)    Weight 142 lb 3.2 oz (64.5 kg)    BMI (Calculated) 24.4    Single Leg Stand --   not attempted            Nutrition Therapy Plan and Nutrition Goals:  Nutrition Therapy & Goals - 09/07/22 1107       Nutrition Therapy   Diet Heart healthy, low Na, T2Dm and dialysis MNT    Drug/Food Interactions Statins/Certain Fruits    Protein (specify units) 75g    Fiber 30 grams    Whole Grain Foods 3 servings    Saturated Fats 20 max. grams    Fruits and Vegetables 8 servings/day    Sodium 2 grams      Personal Nutrition Goals   Nutrition Goal ST: continue to follow recommendations of renal dietitian at HD [facility]: meet protein and calorie needs, maintain A1C <7    Comments 77 y.o. M admitted to cardiac rehab s/p CABG x 4.  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 Medications reviewed atorvastatin, vitamin D3, vitamin B12, novolog, levemir, synthroid, pantoprazole, miralax, rena-vite,  phoslo (calcium acetate). Labs reviewed. Moo and his wife came into see RD, however, Jordann did not speak much during consultation. His wife reports speaking with MD and dietitian at HD clinic regarding [facility]. 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 His wife reports that he is no longer taking the phosphate binder with meals and has been told to monitor his potassium; discussed the importance of including fiber rich foods during the day for heart health while following guidelines set by renal dietitian based off of his lab results. She reports that he does not eat much usually and  has been eating this way for a long time - she reports his weight is stable. His wife also reports his BG doing well overall and that he does not have many BG lows anymore. Discussed the importance eating enough protein and calories during the day. Patient and wife report having no questions at this time.      Intervention Plan   Intervention Prescribe, educate and counsel regarding individualized specific dietary modifications aiming towards targeted core components such as weight, hypertension, lipid management, diabetes, heart failure and other comorbidities.    Expected Outcomes Short Term Goal: Understand basic principles of dietary content, such as calories, fat, sodium, cholesterol and nutrients.;Short Term Goal: A plan has been developed with personal nutrition goals set during dietitian appointment.;Long Term Goal: Adherence to prescribed nutrition plan.             Nutrition Assessments:  MEDIFICTS Score Key: ?70 Need to make dietary changes  40-70 Heart Healthy Diet ? 40 Therapeutic Level Cholesterol Diet  Flowsheet Row Cardiac Rehab from 09/07/2022 in [facility] Cardiac and Pul[facility] Rehab  Picture Your Plate Total Score on Admission 44      Picture Your Plate Scores: <09 Unhealthy dietary pattern with much room for improvement. 41-50 Dietary pattern unlikely to meet recommendations for good health and room for improvement. 51-60 More healthful dietary pattern, with some room for improvement.  >60 Healthy dietary pattern, although there may be some specific behaviors that could be improved.    Nutrition Goals Re-Evaluation:  Nutrition Goals Re-Evaluation     Row Name 09/02/22 1128             Goals   Comment Joseph Mcpherson wants his wife to meet with the dietician, but scheduling with her has been difficult. He does talk with his dialysis staff about his renal diet. He mentions he also is aware of what foods affect his diabetes.       Expected Outcome Short: meet with dietician  if schedules allow; Long: independently manage renal diet.                Nutrition Goals Discharge (Final Nutrition Goals Re-Evaluation):  Nutrition Goals Re-Evaluation - 09/02/22 1128       Goals   Comment Joseph Mcpherson wants his wife to meet with the dietician, but scheduling with her has been difficult. He does talk with his dialysis staff about his renal diet. He mentions he also is aware of what foods affect his diabetes.    Expected Outcome Short: meet with dietician if schedules allow; Long: independently manage renal diet.             Psychosocial: Target Goals: Acknowledge presence Joseph absence of significant depression and/Joseph  stress, maximize coping skills, provide positive support system. Participant is able to verbalize types and ability to use techniques and skills needed for reducing stress and depression.   Education: Stress, Anxiety, and Depression - Group verbal and visual presentation to define topics covered.  Reviews how body is impacted by stress, anxiety, and depression.  Also discusses healthy ways to reduce stress and to treat/manage anxiety and depression.  Written material given at graduation.   Education: Sleep Hygiene -Provides group verbal and written instruction about how sleep can affect your health.  Define sleep hygiene, discuss sleep cycles and impact of sleep habits. Review good sleep hygiene tips.    Initial Review & Psychosocial Screening:  Initial Psych Review & Screening - 07/27/22 1553       Initial Review   Current issues with None Identified      Family Dynamics   Good Support System? Yes   Wife and Son            Quality of Life Scores:   Quality of Life - 08/03/22 1709       Quality of Life   Select --   deferred, patient unable to complete QOL questionaire.           Scores of 19 and below usually indicate a poorer quality of life in these areas.  A difference of  2-3 points is a clinically meaningful difference.  A  difference of 2-3 points in the total score of the Quality of Life Index has been associated with significant improvement in overall quality of life, self-image, physical symptoms, and general health in studies assessing change in quality of life.  PHQ-9: Review Flowsheet       06/19/2021 03/12/2020  Depression screen PHQ 2/9  Decreased Interest 0 0  Down, Depressed, Hopeless 0 0  PHQ - 2 Score 0 0   Interpretation of Total Score  Total Score Depression Severity:  1-4 = Minimal depression, 5-9 = Mild depression, 10-14 = Moderate depression, 15-19 = Moderately severe depression, 20-27 = Severe depression   Psychosocial Evaluation and Intervention:  Psychosocial Evaluation - 07/27/22 1605       Psychosocial Evaluation & Interventions   Interventions Encouraged to exercise with the program and follow exercise prescription;Relaxation education;Stress management education    Comments Joseph Mcpherson is coming to cardiac rehab post CABG x 4. He has no barriers for attending the program. He lives with his wife and has good support from his son "Joseph Mcpherson" also who was on the phone call today. Son states he would like his father to have encourangement to complete program to obtain a more active lifestyle after bypass surgery, to increase strength, move around more independantly and potentially to be able to drive again. Joeb does use a walker occasionally with ambulation. 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    Expected Outcomes Short: Attend cardiac rehab for education and exercise. Long:  Develop and maintain positive self care habits    Continue Psychosocial Services  Follow up required by staff             Psychosocial Re-Evaluation:  Psychosocial Re-Evaluation     Row Name 09/02/22 1124             Psychosocial Re-Evaluation   Current issues with Current Sleep Concerns       Comments Irby's sleep schedule has  been a stressor. He has a very hard time falling asleep  until early morning. Coming to class at 43 has kept him on a regular wake up schedule, but he is still struggling with falling asleep. He looks forward to his naps in the afternoon. He mentions his wife is a big help at home. He doesn't always feel up for coming to class, but his wife and he knows its for the best. It is hard to manage dialysis schedule on top of class schedule, leaving him with only Sunday free. He knows this is temporary and he wants to see it through.       Expected Outcomes Short: attend class consistenlty to maintain a schedule; limit sleep distractions at night. Long: maintain a positive sleep schedule.       Interventions Encouraged to attend Cardiac Rehabilitation for the exercise       Continue Psychosocial Services  Follow up required by staff                Psychosocial Discharge (Final Psychosocial Re-Evaluation):  Psychosocial Re-Evaluation - 09/02/22 1124       Psychosocial Re-Evaluation   Current issues with Current Sleep Concerns    Comments Joseph Mcpherson's sleep schedule has been a stressor. He has a very hard time falling asleep until early morning. Coming to class at 77 has kept him on a regular wake up schedule, but he is still struggling with falling asleep. He looks forward to his naps in the afternoon. He mentions his wife is a big help at home. He doesn't always feel up for coming to class, but his wife and he knows its for the best. It is hard to manage dialysis schedule on top of class schedule, leaving him with only Sunday free. He knows this is temporary and he wants to see it through.    Expected Outcomes Short: attend class consistenlty to maintain a schedule; limit sleep distractions at night. Long: maintain a positive sleep schedule.    Interventions Encouraged to attend Cardiac Rehabilitation for the exercise    Continue Psychosocial Services  Follow up required by staff              Vocational Rehabilitation: Provide vocational rehab assistance to qualifying candidates.   Vocational Rehab Evaluation & Intervention:  Vocational Rehab - 07/27/22 1605       Initial Vocational Rehab Evaluation & Intervention   Assessment shows need for Vocational Rehabilitation No             Education: Education Goals: Education classes will be provided on a variety of topics geared toward better understanding of heart health and risk factor modification. Participant will state understanding/return demonstration of topics presented as noted by education test scores.  Learning Barriers/Preferences:  Learning Barriers/Preferences - 07/27/22 1605       Learning Barriers/Preferences   Learning Barriers None    Learning Preferences None             General Cardiac Education Topics:  AED/CPR: - Group verbal and written instruction with the use of models to demonstrate the basic use of the AED with the basic ABC's of resuscitation.   Anatomy and Cardiac Procedures: - Group verbal and visual presentation and models provide information about basic cardiac anatomy and function. Reviews the testing methods done to diagnose heart disease and the outcomes of the test results. Describes the treatment choices: Medical Management, Angioplasty, Joseph Coronary Bypass Surgery for treating various heart conditions including Myocardial Infarction, Angina, Valve Disease, and Cardiac Arrhythmias.  Written material given at graduation.  Medication Safety: - Group verbal and visual instruction to review commonly prescribed medications for heart and lung disease. Reviews the medication, class of the drug, and side effects. Includes the steps to properly store meds and maintain the prescription regimen.  Written material given at graduation.   Intimacy: - Group verbal instruction through game format to discuss how heart and lung disease can affect sexual intimacy. Written material given at  graduation..   Know Your Numbers and Heart Failure: - Group verbal and visual instruction to discuss disease risk factors for cardiac and pulmonary disease and treatment options.  Reviews associated critical values for Overweight/Obesity, Hypertension, Cholesterol, and Diabetes.  Discusses basics of heart failure: signs/symptoms and treatments.  Introduces Heart Failure Zone chart for action plan for heart failure.  Written material given at graduation.   Infection Prevention: - Provides verbal and written material to individual with discussion of infection control including proper hand washing and proper equipment cleaning during exercise session. Flowsheet Row Cardiac Rehab from 08/03/2022 in [facility] Cardiac and Pulmonary Rehab  Date 08/03/22  Educator [facility]  Instruction Review Code 1- Verbalizes Understanding       Falls Prevention: - Provides verbal and written material to individual with discussion of falls prevention and safety. Flowsheet Row Cardiac Rehab from 08/03/2022 in [facility] Cardiac and Pulmonary Rehab  Date 08/03/22  Educator [facility]  Instruction Review Code 1- Verbalizes Understanding       Other: -Provides group and verbal instruction on various topics (see comments)   Knowledge Questionnaire Score:  Knowledge Questionnaire Score - 08/03/22 1709       Knowledge Questionnaire Score   Pre Score deferred, Patient unable to complete knowledge questionaire.             Core Components/Risk Factors/Patient Goals at Admission:  Personal Goals and Risk Factors at Admission - 08/03/22 1710       Core Components/Risk Factors/Patient Goals on Admission    Weight Management Yes    Intervention Weight Management: Develop a combined nutrition and exercise program designed to reach desired caloric intake, while maintaining appropriate intake of nutrient and fiber, sodium and fats, and appropriate energy expenditure required for the weight goal.;Weight Management: Provide education  and appropriate resources to help participant work on and attain dietary goals.;Weight Management/Obesity: Establish reasonable short term and long term weight goals.    Admit Weight 142 lb (64.4 kg)    Goal Weight: Short Term 140 lb (63.5 kg)    Goal Weight: Long Term 140 lb (63.5 kg)    Expected Outcomes Short Term: Continue to assess and modify interventions until short term weight is achieved;Weight Maintenance: Understanding of the daily nutrition guidelines, which includes 25-35% calories from fat, 7% Joseph less cal from saturated fats, less than 200mg  cholesterol, less than 1.5gm of sodium, & 5 Joseph more servings of fruits and vegetables daily;Understanding recommendations for meals to include 15-35% energy as protein, 25-35% energy from fat, 35-60% energy from carbohydrates, less than 200mg  of dietary cholesterol, 20-35 gm of total fiber daily;Understanding of distribution of calorie intake throughout the day with the consumption of 4-5 meals/snacks;Long Term: Adherence to nutrition and physical activity/exercise program aimed toward attainment of established weight goal    Diabetes Yes    Intervention Provide education about signs/symptoms and action to take for hypo/hyperglycemia.;Provide education about proper nutrition, including hydration, and aerobic/resistive exercise prescription along with prescribed medications to achieve blood glucose in normal ranges: Fasting glucose 65-99 mg/dL    Expected Outcomes Short Term: Participant verbalizes  understanding of the signs/symptoms and immediate care of hyper/hypoglycemia, proper foot care and importance of medication, aerobic/resistive exercise and nutrition plan for blood glucose control.;Long Term: Attainment of HbA1C < 7%.    Heart Failure Yes    Intervention Provide a combined exercise and nutrition program that is supplemented with education, support and counseling about heart failure. Directed toward relieving symptoms such as shortness of breath,  decreased exercise tolerance, and extremity edema.    Expected Outcomes Improve functional capacity of life;Short term: Attendance in program 2-3 days a week with increased exercise capacity. Reported lower sodium intake. Reported increased fruit and vegetable intake. Reports medication compliance.;Short term: Daily weights obtained and reported for increase. Utilizing diuretic protocols set by physician.;Long term: Adoption of self-care skills and reduction of barriers for early signs and symptoms recognition and intervention leading to self-care maintenance.    Hypertension Yes    Intervention Provide education on lifestyle modifcations including regular physical activity/exercise, weight management, moderate sodium restriction and increased consumption of fresh fruit, vegetables, and low fat dairy, alcohol moderation, and smoking cessation.;Monitor prescription use compliance.    Expected Outcomes Short Term: Continued assessment and intervention until BP is < 140/55mm HG in hypertensive participants. < 130/72mm HG in hypertensive participants with diabetes, heart failure Joseph chronic kidney disease.;Long Term: Maintenance of blood pressure at goal levels.    Lipids Yes    Intervention Provide education and support for participant on nutrition & aerobic/resistive exercise along with prescribed medications to achieve LDL 70mg , HDL >40mg .    Expected Outcomes Short Term: Participant states understanding of desired cholesterol values and is compliant with medications prescribed. Participant is following exercise prescription and nutrition guidelines.;Long Term: Cholesterol controlled with medications as prescribed, with individualized exercise RX and with personalized nutrition plan. Value goals: LDL < 70mg , HDL > 40 mg.             Education:Diabetes - Individual verbal and written instruction to review signs/symptoms of diabetes, desired ranges of glucose level fasting, after meals and with  exercise. Acknowledge that pre and post exercise glucose checks will be done for 3 sessions at entry of program. Flowsheet Row Cardiac Rehab from 08/03/2022 in [facility] Cardiac and Pulmonary Rehab  Date 08/03/22  Educator [facility]  Instruction Review Code 1- Verbalizes Understanding       Core Components/Risk Factors/Patient Goals Review:   Goals and Risk Factor Review     Row Name 09/02/22 1118             Core Components/Risk Factors/Patient Goals Review   Personal Goals Review Diabetes;Heart Failure;Weight Management/Obesity       Review Joseph Mcpherson has been checking his sugar in the morning and reports it is going well most days. He is attending dialysis regularly and is working on managing his heart failure symptoms. He has noticed a slight increase in stamina and wants to work on his strength. He has met his weight goal and wants to maintain his weight which he knows he needs to manage his fluid balance.       Expected Outcomes Short: maintain weight by managing fluid intake and eating consistenly. Long:attend the program consistenly to help manage his heart failure and diabetes.                Core Components/Risk Factors/Patient Goals at Discharge (Final Review):   Goals and Risk Factor Review - 09/02/22 1118       Core Components/Risk Factors/Patient Goals Review   Personal Goals Review Diabetes;Heart Failure;Weight Management/Obesity  Review Joseph Mcpherson has been checking his sugar in the morning and reports it is going well most days. He is attending dialysis regularly and is working on managing his heart failure symptoms. He has noticed a slight increase in stamina and wants to work on his strength. He has met his weight goal and wants to maintain his weight which he knows he needs to manage his fluid balance.    Expected Outcomes Short: maintain weight by managing fluid intake and eating consistenly. Long:attend the program consistenly to help manage his heart failure and diabetes.              ITP Comments:  ITP Comments     Row Name 07/27/22 1613 08/03/22 1656 08/05/22 1541 08/12/22 0924 09/09/22 1345   ITP Comments Initial phone call completed. Diagnosis can be found in [facility] 12/5. EP orientation scheduled for 3/11. Completed and gym orientation. Initial ITP created and sent for review to Dr. Bethann Punches, Medical Director. First full day of exercise!  Patient was oriented to gym and equipment including functions, settings, policies, and procedures.  Patient's individual exercise prescription and treatment plan were reviewed.  All starting workloads were established based on the results of the 6 minute walk test done at initial orientation visit.  The plan for exercise progression was also introduced and progression will be customized based on patient's performance and goals.' 30 Day review completed. Medical Director ITP review done, changes made as directed, and signed approval by Medical Director.     new to program 30 day review completed. ITP sent to Dr. Bethann Punches, Medical Director of Cardiac Rehab. Continue with ITP unless changes are made by physician.    Row Name 09/25/22 1146 10/07/22 0928 10/26/22 1517       ITP Comments Joseph Mcpherson graduated today from  rehab with 23 sessions completed.  Details of the patient's exercise prescription and what He needs to do in order to continue the prescription and progress were discussed with patient.  Patient was given a copy of prescription and goals.  Patient verbalized understanding.  Joseph Mcpherson plans to continue to exercise by walking and joining the [facility] after his trip to [place]. 30 Day review completed. Medical Director ITP review done, changes made as directed, and signed approval by Medical Director.    wife did not want Joseph Mcpherson to finish early, they are vacation and we will wait until he returns to see if he will complete the program instead of early discharge Pt has not attended rehab over last month.  We will discharge him at  this time.              Comments: Discharge ITP

## 2022-10-27 DIAGNOSIS — N2581 Secondary hyperparathyroidism of renal origin: Secondary | ICD-10-CM | POA: Diagnosis not present

## 2022-10-27 DIAGNOSIS — N186 End stage renal disease: Secondary | ICD-10-CM | POA: Diagnosis not present

## 2022-10-27 DIAGNOSIS — Z992 Dependence on renal dialysis: Secondary | ICD-10-CM | POA: Diagnosis not present

## 2022-10-29 DIAGNOSIS — N186 End stage renal disease: Secondary | ICD-10-CM | POA: Diagnosis not present

## 2022-10-29 DIAGNOSIS — Z992 Dependence on renal dialysis: Secondary | ICD-10-CM | POA: Diagnosis not present

## 2022-10-29 DIAGNOSIS — N2581 Secondary hyperparathyroidism of renal origin: Secondary | ICD-10-CM | POA: Diagnosis not present

## 2022-10-31 DIAGNOSIS — Z992 Dependence on renal dialysis: Secondary | ICD-10-CM | POA: Diagnosis not present

## 2022-10-31 DIAGNOSIS — N186 End stage renal disease: Secondary | ICD-10-CM | POA: Diagnosis not present

## 2022-10-31 DIAGNOSIS — N2581 Secondary hyperparathyroidism of renal origin: Secondary | ICD-10-CM | POA: Diagnosis not present

## 2022-11-03 DIAGNOSIS — N186 End stage renal disease: Secondary | ICD-10-CM | POA: Diagnosis not present

## 2022-11-03 DIAGNOSIS — N2581 Secondary hyperparathyroidism of renal origin: Secondary | ICD-10-CM | POA: Diagnosis not present

## 2022-11-03 DIAGNOSIS — Z992 Dependence on renal dialysis: Secondary | ICD-10-CM | POA: Diagnosis not present

## 2022-11-04 ENCOUNTER — Encounter: Payer: Self-pay | Admitting: *Deleted

## 2022-11-05 DIAGNOSIS — Z992 Dependence on renal dialysis: Secondary | ICD-10-CM | POA: Diagnosis not present

## 2022-11-05 DIAGNOSIS — N186 End stage renal disease: Secondary | ICD-10-CM | POA: Diagnosis not present

## 2022-11-05 DIAGNOSIS — N2581 Secondary hyperparathyroidism of renal origin: Secondary | ICD-10-CM | POA: Diagnosis not present

## 2022-11-07 DIAGNOSIS — Z992 Dependence on renal dialysis: Secondary | ICD-10-CM | POA: Diagnosis not present

## 2022-11-07 DIAGNOSIS — N186 End stage renal disease: Secondary | ICD-10-CM | POA: Diagnosis not present

## 2022-11-07 DIAGNOSIS — N2581 Secondary hyperparathyroidism of renal origin: Secondary | ICD-10-CM | POA: Diagnosis not present

## 2022-11-09 DIAGNOSIS — Z9889 Other specified postprocedural states: Secondary | ICD-10-CM | POA: Diagnosis not present

## 2022-11-09 DIAGNOSIS — E782 Mixed hyperlipidemia: Secondary | ICD-10-CM | POA: Diagnosis not present

## 2022-11-09 DIAGNOSIS — I10 Essential (primary) hypertension: Secondary | ICD-10-CM | POA: Diagnosis not present

## 2022-11-09 DIAGNOSIS — I5020 Unspecified systolic (congestive) heart failure: Secondary | ICD-10-CM | POA: Diagnosis not present

## 2022-11-09 DIAGNOSIS — Z951 Presence of aortocoronary bypass graft: Secondary | ICD-10-CM | POA: Diagnosis not present

## 2022-11-09 DIAGNOSIS — I480 Paroxysmal atrial fibrillation: Secondary | ICD-10-CM | POA: Diagnosis not present

## 2022-11-09 DIAGNOSIS — I519 Heart disease, unspecified: Secondary | ICD-10-CM | POA: Diagnosis not present

## 2022-11-09 DIAGNOSIS — N186 End stage renal disease: Secondary | ICD-10-CM | POA: Diagnosis not present

## 2022-11-09 DIAGNOSIS — E0842 Diabetes mellitus due to underlying condition with diabetic polyneuropathy: Secondary | ICD-10-CM | POA: Diagnosis not present

## 2022-11-10 DIAGNOSIS — N2581 Secondary hyperparathyroidism of renal origin: Secondary | ICD-10-CM | POA: Diagnosis not present

## 2022-11-10 DIAGNOSIS — Z992 Dependence on renal dialysis: Secondary | ICD-10-CM | POA: Diagnosis not present

## 2022-11-10 DIAGNOSIS — N186 End stage renal disease: Secondary | ICD-10-CM | POA: Diagnosis not present

## 2022-11-12 DIAGNOSIS — N186 End stage renal disease: Secondary | ICD-10-CM | POA: Diagnosis not present

## 2022-11-12 DIAGNOSIS — N2581 Secondary hyperparathyroidism of renal origin: Secondary | ICD-10-CM | POA: Diagnosis not present

## 2022-11-12 DIAGNOSIS — Z992 Dependence on renal dialysis: Secondary | ICD-10-CM | POA: Diagnosis not present

## 2022-11-14 DIAGNOSIS — Z992 Dependence on renal dialysis: Secondary | ICD-10-CM | POA: Diagnosis not present

## 2022-11-14 DIAGNOSIS — N2581 Secondary hyperparathyroidism of renal origin: Secondary | ICD-10-CM | POA: Diagnosis not present

## 2022-11-14 DIAGNOSIS — N186 End stage renal disease: Secondary | ICD-10-CM | POA: Diagnosis not present

## 2022-11-17 DIAGNOSIS — N2581 Secondary hyperparathyroidism of renal origin: Secondary | ICD-10-CM | POA: Diagnosis not present

## 2022-11-17 DIAGNOSIS — Z992 Dependence on renal dialysis: Secondary | ICD-10-CM | POA: Diagnosis not present

## 2022-11-17 DIAGNOSIS — N186 End stage renal disease: Secondary | ICD-10-CM | POA: Diagnosis not present

## 2022-11-18 ENCOUNTER — Ambulatory Visit: Payer: Self-pay | Admitting: *Deleted

## 2022-11-18 NOTE — Patient Outreach (Signed)
  Care Coordination   Follow Up Visit Note   11/18/2022 Name: Joseph Mcpherson MRN: [id number] DOB: 31-Oct-1945  Joseph Mcpherson is a 77 y.o. year old male who sees Corky Downs, MD for primary care. I spoke with Norwood Levo, daughter of Joseph Mcpherson by phone today.  What matters to the patients health and wellness today?  Daughter state patient is doing well, but admits that she does not see them daily as she lives 3 hours away.  She is in contact with them by telephone every few days.  Calls to patient and wife unsuccessful.     Goals Addressed             This Visit's Progress    Care coordination activities/ Post hospital follow up / management of health conditions   On track     Interventions Today    Flowsheet Row Most Recent Value  Chronic Disease   Chronic disease during today's visit --  [CAD, s/p CABG]  General Interventions   General Interventions Discussed/Reviewed General Interventions Reviewed, Doctor Visits  Doctor Visits Discussed/Reviewed Doctor Visits Reviewed, Specialist, PCP  [Daughter state pt active with cardiac rehab, however per chart, he has canceled last several sessions and is now dischaged.  Endocrine 7/10 and PCP 8/5]     Attempted to call patient and wife directly to follow up on CABG recovery, no answer.        SDOH assessments and interventions completed:  No     Care Coordination Interventions:  Yes, provided   Follow up plan: Follow up call scheduled for 7/15    Encounter Outcome:  Pt. Visit Completed   Kemper Durie, RN, MSN, [facility] Care Management Care Management Coordinator [phone]

## 2022-11-18 NOTE — Patient Outreach (Signed)
  Care Coordination   11/18/2022 Name: Diago Haik MRN: [id number] DOB: Aug 27, 1945   Care Coordination Outreach Attempts:  An unsuccessful telephone outreach was attempted for a scheduled appointment today.  Follow Up Plan:  Additional outreach attempts will be made to offer the patient care coordination information and services.   Encounter Outcome:  No Answer   Care Coordination Interventions:  No, not indicated    Kemper Durie, RN, MSN, [facility] Care Management Care Management Coordinator [phone]

## 2022-11-19 DIAGNOSIS — N186 End stage renal disease: Secondary | ICD-10-CM | POA: Diagnosis not present

## 2022-11-19 DIAGNOSIS — N2581 Secondary hyperparathyroidism of renal origin: Secondary | ICD-10-CM | POA: Diagnosis not present

## 2022-11-19 DIAGNOSIS — Z992 Dependence on renal dialysis: Secondary | ICD-10-CM | POA: Diagnosis not present

## 2022-11-21 DIAGNOSIS — Z992 Dependence on renal dialysis: Secondary | ICD-10-CM | POA: Diagnosis not present

## 2022-11-21 DIAGNOSIS — N186 End stage renal disease: Secondary | ICD-10-CM | POA: Diagnosis not present

## 2022-11-21 DIAGNOSIS — N2581 Secondary hyperparathyroidism of renal origin: Secondary | ICD-10-CM | POA: Diagnosis not present

## 2022-11-22 DIAGNOSIS — Z992 Dependence on renal dialysis: Secondary | ICD-10-CM | POA: Diagnosis not present

## 2022-11-22 DIAGNOSIS — N186 End stage renal disease: Secondary | ICD-10-CM | POA: Diagnosis not present

## 2022-11-22 DIAGNOSIS — E1122 Type 2 diabetes mellitus with diabetic chronic kidney disease: Secondary | ICD-10-CM | POA: Diagnosis not present

## 2022-11-24 DIAGNOSIS — Z992 Dependence on renal dialysis: Secondary | ICD-10-CM | POA: Diagnosis not present

## 2022-11-24 DIAGNOSIS — N186 End stage renal disease: Secondary | ICD-10-CM | POA: Diagnosis not present

## 2022-11-24 DIAGNOSIS — N2581 Secondary hyperparathyroidism of renal origin: Secondary | ICD-10-CM | POA: Diagnosis not present

## 2022-11-26 DIAGNOSIS — Z992 Dependence on renal dialysis: Secondary | ICD-10-CM | POA: Diagnosis not present

## 2022-11-26 DIAGNOSIS — N186 End stage renal disease: Secondary | ICD-10-CM | POA: Diagnosis not present

## 2022-11-26 DIAGNOSIS — N2581 Secondary hyperparathyroidism of renal origin: Secondary | ICD-10-CM | POA: Diagnosis not present

## 2022-11-28 DIAGNOSIS — N2581 Secondary hyperparathyroidism of renal origin: Secondary | ICD-10-CM | POA: Diagnosis not present

## 2022-11-28 DIAGNOSIS — N186 End stage renal disease: Secondary | ICD-10-CM | POA: Diagnosis not present

## 2022-11-28 DIAGNOSIS — Z992 Dependence on renal dialysis: Secondary | ICD-10-CM | POA: Diagnosis not present

## 2022-12-01 DIAGNOSIS — N186 End stage renal disease: Secondary | ICD-10-CM | POA: Diagnosis not present

## 2022-12-01 DIAGNOSIS — Z992 Dependence on renal dialysis: Secondary | ICD-10-CM | POA: Diagnosis not present

## 2022-12-01 DIAGNOSIS — N2581 Secondary hyperparathyroidism of renal origin: Secondary | ICD-10-CM | POA: Diagnosis not present

## 2022-12-02 DIAGNOSIS — Z794 Long term (current) use of insulin: Secondary | ICD-10-CM | POA: Diagnosis not present

## 2022-12-02 DIAGNOSIS — E119 Type 2 diabetes mellitus without complications: Secondary | ICD-10-CM | POA: Diagnosis not present

## 2022-12-03 DIAGNOSIS — Z992 Dependence on renal dialysis: Secondary | ICD-10-CM | POA: Diagnosis not present

## 2022-12-03 DIAGNOSIS — N186 End stage renal disease: Secondary | ICD-10-CM | POA: Diagnosis not present

## 2022-12-03 DIAGNOSIS — N2581 Secondary hyperparathyroidism of renal origin: Secondary | ICD-10-CM | POA: Diagnosis not present

## 2022-12-05 DIAGNOSIS — N186 End stage renal disease: Secondary | ICD-10-CM | POA: Diagnosis not present

## 2022-12-05 DIAGNOSIS — N2581 Secondary hyperparathyroidism of renal origin: Secondary | ICD-10-CM | POA: Diagnosis not present

## 2022-12-05 DIAGNOSIS — Z992 Dependence on renal dialysis: Secondary | ICD-10-CM | POA: Diagnosis not present

## 2022-12-07 ENCOUNTER — Ambulatory Visit: Payer: Self-pay | Admitting: *Deleted

## 2022-12-07 NOTE — Patient Outreach (Signed)
  Care Coordination   12/07/2022 Name: Joseph Mcpherson MRN: [id number] DOB: 10/13/1945   Care Coordination Outreach Attempts:  An unsuccessful telephone outreach was attempted for a scheduled appointment today.  Follow Up Plan:  Additional outreach attempts will be made to offer the patient care coordination information and services.   Encounter Outcome:  No Answer   Care Coordination Interventions:  No, not indicated    Kemper Durie, RN, MSN, [facility] Care Management Care Management Coordinator [phone]

## 2022-12-08 DIAGNOSIS — Z992 Dependence on renal dialysis: Secondary | ICD-10-CM | POA: Diagnosis not present

## 2022-12-08 DIAGNOSIS — N2581 Secondary hyperparathyroidism of renal origin: Secondary | ICD-10-CM | POA: Diagnosis not present

## 2022-12-08 DIAGNOSIS — N186 End stage renal disease: Secondary | ICD-10-CM | POA: Diagnosis not present

## 2022-12-10 DIAGNOSIS — N2581 Secondary hyperparathyroidism of renal origin: Secondary | ICD-10-CM | POA: Diagnosis not present

## 2022-12-10 DIAGNOSIS — N186 End stage renal disease: Secondary | ICD-10-CM | POA: Diagnosis not present

## 2022-12-10 DIAGNOSIS — Z992 Dependence on renal dialysis: Secondary | ICD-10-CM | POA: Diagnosis not present

## 2022-12-12 DIAGNOSIS — Z992 Dependence on renal dialysis: Secondary | ICD-10-CM | POA: Diagnosis not present

## 2022-12-12 DIAGNOSIS — N186 End stage renal disease: Secondary | ICD-10-CM | POA: Diagnosis not present

## 2022-12-12 DIAGNOSIS — N2581 Secondary hyperparathyroidism of renal origin: Secondary | ICD-10-CM | POA: Diagnosis not present

## 2022-12-15 DIAGNOSIS — N186 End stage renal disease: Secondary | ICD-10-CM | POA: Diagnosis not present

## 2022-12-15 DIAGNOSIS — N2581 Secondary hyperparathyroidism of renal origin: Secondary | ICD-10-CM | POA: Diagnosis not present

## 2022-12-15 DIAGNOSIS — Z992 Dependence on renal dialysis: Secondary | ICD-10-CM | POA: Diagnosis not present

## 2022-12-17 DIAGNOSIS — N2581 Secondary hyperparathyroidism of renal origin: Secondary | ICD-10-CM | POA: Diagnosis not present

## 2022-12-17 DIAGNOSIS — N186 End stage renal disease: Secondary | ICD-10-CM | POA: Diagnosis not present

## 2022-12-17 DIAGNOSIS — Z992 Dependence on renal dialysis: Secondary | ICD-10-CM | POA: Diagnosis not present

## 2022-12-19 DIAGNOSIS — N2581 Secondary hyperparathyroidism of renal origin: Secondary | ICD-10-CM | POA: Diagnosis not present

## 2022-12-19 DIAGNOSIS — Z992 Dependence on renal dialysis: Secondary | ICD-10-CM | POA: Diagnosis not present

## 2022-12-19 DIAGNOSIS — N186 End stage renal disease: Secondary | ICD-10-CM | POA: Diagnosis not present

## 2022-12-21 DIAGNOSIS — I5020 Unspecified systolic (congestive) heart failure: Secondary | ICD-10-CM | POA: Diagnosis not present

## 2022-12-21 DIAGNOSIS — E1122 Type 2 diabetes mellitus with diabetic chronic kidney disease: Secondary | ICD-10-CM | POA: Diagnosis not present

## 2022-12-21 DIAGNOSIS — N186 End stage renal disease: Secondary | ICD-10-CM | POA: Diagnosis not present

## 2022-12-21 DIAGNOSIS — Z992 Dependence on renal dialysis: Secondary | ICD-10-CM | POA: Diagnosis not present

## 2022-12-21 DIAGNOSIS — I480 Paroxysmal atrial fibrillation: Secondary | ICD-10-CM | POA: Diagnosis not present

## 2022-12-21 DIAGNOSIS — R5383 Other fatigue: Secondary | ICD-10-CM | POA: Diagnosis not present

## 2022-12-21 DIAGNOSIS — R5381 Other malaise: Secondary | ICD-10-CM | POA: Diagnosis not present

## 2022-12-21 DIAGNOSIS — I132 Hypertensive heart and chronic kidney disease with heart failure and with stage 5 chronic kidney disease, or end stage renal disease: Secondary | ICD-10-CM | POA: Diagnosis not present

## 2022-12-21 DIAGNOSIS — D631 Anemia in chronic kidney disease: Secondary | ICD-10-CM | POA: Diagnosis not present

## 2022-12-22 DIAGNOSIS — Z992 Dependence on renal dialysis: Secondary | ICD-10-CM | POA: Diagnosis not present

## 2022-12-22 DIAGNOSIS — N186 End stage renal disease: Secondary | ICD-10-CM | POA: Diagnosis not present

## 2022-12-22 DIAGNOSIS — N2581 Secondary hyperparathyroidism of renal origin: Secondary | ICD-10-CM | POA: Diagnosis not present

## 2022-12-23 DIAGNOSIS — D631 Anemia in chronic kidney disease: Secondary | ICD-10-CM | POA: Diagnosis not present

## 2022-12-23 DIAGNOSIS — I5020 Unspecified systolic (congestive) heart failure: Secondary | ICD-10-CM | POA: Diagnosis not present

## 2022-12-23 DIAGNOSIS — R5381 Other malaise: Secondary | ICD-10-CM | POA: Diagnosis not present

## 2022-12-23 DIAGNOSIS — I10 Essential (primary) hypertension: Secondary | ICD-10-CM | POA: Diagnosis not present

## 2022-12-23 DIAGNOSIS — Z992 Dependence on renal dialysis: Secondary | ICD-10-CM | POA: Diagnosis not present

## 2022-12-23 DIAGNOSIS — N186 End stage renal disease: Secondary | ICD-10-CM | POA: Diagnosis not present

## 2022-12-23 DIAGNOSIS — I480 Paroxysmal atrial fibrillation: Secondary | ICD-10-CM | POA: Diagnosis not present

## 2022-12-23 DIAGNOSIS — Z951 Presence of aortocoronary bypass graft: Secondary | ICD-10-CM | POA: Diagnosis not present

## 2022-12-23 DIAGNOSIS — E1122 Type 2 diabetes mellitus with diabetic chronic kidney disease: Secondary | ICD-10-CM | POA: Diagnosis not present

## 2022-12-23 DIAGNOSIS — R5383 Other fatigue: Secondary | ICD-10-CM | POA: Diagnosis not present

## 2022-12-24 DIAGNOSIS — N2581 Secondary hyperparathyroidism of renal origin: Secondary | ICD-10-CM | POA: Diagnosis not present

## 2022-12-24 DIAGNOSIS — Z992 Dependence on renal dialysis: Secondary | ICD-10-CM | POA: Diagnosis not present

## 2022-12-24 DIAGNOSIS — N186 End stage renal disease: Secondary | ICD-10-CM | POA: Diagnosis not present

## 2022-12-24 DIAGNOSIS — R8290 Unspecified abnormal findings in urine: Secondary | ICD-10-CM | POA: Diagnosis not present

## 2022-12-26 DIAGNOSIS — N2581 Secondary hyperparathyroidism of renal origin: Secondary | ICD-10-CM | POA: Diagnosis not present

## 2022-12-26 DIAGNOSIS — Z992 Dependence on renal dialysis: Secondary | ICD-10-CM | POA: Diagnosis not present

## 2022-12-26 DIAGNOSIS — N186 End stage renal disease: Secondary | ICD-10-CM | POA: Diagnosis not present

## 2022-12-29 ENCOUNTER — Telehealth: Payer: Self-pay | Admitting: *Deleted

## 2022-12-29 DIAGNOSIS — N186 End stage renal disease: Secondary | ICD-10-CM | POA: Diagnosis not present

## 2022-12-29 DIAGNOSIS — N2581 Secondary hyperparathyroidism of renal origin: Secondary | ICD-10-CM | POA: Diagnosis not present

## 2022-12-29 DIAGNOSIS — Z992 Dependence on renal dialysis: Secondary | ICD-10-CM | POA: Diagnosis not present

## 2022-12-29 NOTE — Patient Outreach (Signed)
  Care Coordination   Follow Up Visit Note   12/29/2022 Name: Joseph Mcpherson MRN: [id number] DOB: Dec 30, 1945  Joseph Mcpherson is a 77 y.o. year old male who sees Corky Downs, MD for primary care. I spoke with Justice Britain, wife of Deacan Scharff by phone today.  What matters to the patients health and wellness today?  Continue to stay healthy.  Wife declines to have follow up calls, state they will call this RNCM if they have any questions/needs.     Goals Addressed             This Visit's Progress    COMPLETED: Care coordination activities/ Post hospital follow up / management of health conditions   On track    Interventions Today    Flowsheet Row Most Recent Value  Chronic Disease   Chronic disease during today's visit Other  [CAD/CABG, wife report patient has been doing well, recovered from surgery]  General Interventions   General Interventions Discussed/Reviewed General Interventions Reviewed, Doctor Visits  [Discussed cardiac rehab, wife state patient declined to participate due to the frequency of visits, already out of the home for HD sessions several times a week]  Doctor Visits Discussed/Reviewed Doctor Visits Reviewed, PCP, Specialist  [Compliant with HD 3 days a week, Wife will call PCP office to schedule follow up.  Neuro on 9/30, Cardiology on 10/21, Endocrinology 10/24, and Pulmonary 11/6]  PCP/Specialist Visits Compliance with follow-up visit  Education Interventions   Education Provided Provided Education  Provided Verbal Education On Nutrition, Medication, When to see the doctor              SDOH assessments and interventions completed:  Yes     Care Coordination Interventions:  Yes, provided   Follow up plan: No further intervention required.   Encounter Outcome:  Pt. Visit Completed   Kemper Durie, RN, MS, [facility] Care Management Care Management Coordinator [phone]

## 2022-12-31 DIAGNOSIS — N186 End stage renal disease: Secondary | ICD-10-CM | POA: Diagnosis not present

## 2022-12-31 DIAGNOSIS — N2581 Secondary hyperparathyroidism of renal origin: Secondary | ICD-10-CM | POA: Diagnosis not present

## 2022-12-31 DIAGNOSIS — Z992 Dependence on renal dialysis: Secondary | ICD-10-CM | POA: Diagnosis not present

## 2023-01-02 DIAGNOSIS — N186 End stage renal disease: Secondary | ICD-10-CM | POA: Diagnosis not present

## 2023-01-02 DIAGNOSIS — Z992 Dependence on renal dialysis: Secondary | ICD-10-CM | POA: Diagnosis not present

## 2023-01-02 DIAGNOSIS — N2581 Secondary hyperparathyroidism of renal origin: Secondary | ICD-10-CM | POA: Diagnosis not present

## 2023-01-05 DIAGNOSIS — N2581 Secondary hyperparathyroidism of renal origin: Secondary | ICD-10-CM | POA: Diagnosis not present

## 2023-01-05 DIAGNOSIS — Z992 Dependence on renal dialysis: Secondary | ICD-10-CM | POA: Diagnosis not present

## 2023-01-05 DIAGNOSIS — N186 End stage renal disease: Secondary | ICD-10-CM | POA: Diagnosis not present

## 2023-01-07 DIAGNOSIS — N2581 Secondary hyperparathyroidism of renal origin: Secondary | ICD-10-CM | POA: Diagnosis not present

## 2023-01-07 DIAGNOSIS — N186 End stage renal disease: Secondary | ICD-10-CM | POA: Diagnosis not present

## 2023-01-07 DIAGNOSIS — Z992 Dependence on renal dialysis: Secondary | ICD-10-CM | POA: Diagnosis not present

## 2023-01-09 DIAGNOSIS — Z992 Dependence on renal dialysis: Secondary | ICD-10-CM | POA: Diagnosis not present

## 2023-01-09 DIAGNOSIS — N186 End stage renal disease: Secondary | ICD-10-CM | POA: Diagnosis not present

## 2023-01-09 DIAGNOSIS — N2581 Secondary hyperparathyroidism of renal origin: Secondary | ICD-10-CM | POA: Diagnosis not present

## 2023-01-12 DIAGNOSIS — N2581 Secondary hyperparathyroidism of renal origin: Secondary | ICD-10-CM | POA: Diagnosis not present

## 2023-01-12 DIAGNOSIS — Z992 Dependence on renal dialysis: Secondary | ICD-10-CM | POA: Diagnosis not present

## 2023-01-12 DIAGNOSIS — N186 End stage renal disease: Secondary | ICD-10-CM | POA: Diagnosis not present

## 2023-01-14 DIAGNOSIS — Z992 Dependence on renal dialysis: Secondary | ICD-10-CM | POA: Diagnosis not present

## 2023-01-14 DIAGNOSIS — N186 End stage renal disease: Secondary | ICD-10-CM | POA: Diagnosis not present

## 2023-01-14 DIAGNOSIS — N2581 Secondary hyperparathyroidism of renal origin: Secondary | ICD-10-CM | POA: Diagnosis not present

## 2023-01-16 DIAGNOSIS — N2581 Secondary hyperparathyroidism of renal origin: Secondary | ICD-10-CM | POA: Diagnosis not present

## 2023-01-16 DIAGNOSIS — N186 End stage renal disease: Secondary | ICD-10-CM | POA: Diagnosis not present

## 2023-01-16 DIAGNOSIS — Z992 Dependence on renal dialysis: Secondary | ICD-10-CM | POA: Diagnosis not present

## 2023-01-19 DIAGNOSIS — N186 End stage renal disease: Secondary | ICD-10-CM | POA: Diagnosis not present

## 2023-01-19 DIAGNOSIS — Z992 Dependence on renal dialysis: Secondary | ICD-10-CM | POA: Diagnosis not present

## 2023-01-19 DIAGNOSIS — N2581 Secondary hyperparathyroidism of renal origin: Secondary | ICD-10-CM | POA: Diagnosis not present

## 2023-01-21 DIAGNOSIS — N186 End stage renal disease: Secondary | ICD-10-CM | POA: Diagnosis not present

## 2023-01-21 DIAGNOSIS — N2581 Secondary hyperparathyroidism of renal origin: Secondary | ICD-10-CM | POA: Diagnosis not present

## 2023-01-21 DIAGNOSIS — Z992 Dependence on renal dialysis: Secondary | ICD-10-CM | POA: Diagnosis not present

## 2023-01-23 DIAGNOSIS — Z992 Dependence on renal dialysis: Secondary | ICD-10-CM | POA: Diagnosis not present

## 2023-01-23 DIAGNOSIS — N186 End stage renal disease: Secondary | ICD-10-CM | POA: Diagnosis not present

## 2023-01-23 DIAGNOSIS — N2581 Secondary hyperparathyroidism of renal origin: Secondary | ICD-10-CM | POA: Diagnosis not present

## 2023-01-23 DIAGNOSIS — E1122 Type 2 diabetes mellitus with diabetic chronic kidney disease: Secondary | ICD-10-CM | POA: Diagnosis not present

## 2023-05-13 ENCOUNTER — Ambulatory Visit (HOSPITAL_COMMUNITY)
Admission: RE | Admit: 2023-05-13 | Discharge: 2023-05-13 | Disposition: A | Discharge status: Home / Self Care | Payer: Medicare HMO | Attending: Nephrology | Admitting: Nephrology

## 2023-05-13 ENCOUNTER — Other Ambulatory Visit: Payer: Self-pay

## 2023-05-13 ENCOUNTER — Encounter (HOSPITAL_COMMUNITY)
Admission: RE | Disposition: A | Discharge status: Home / Self Care | Payer: Self-pay | Source: Home / Self Care | Attending: Nephrology

## 2023-05-13 DIAGNOSIS — Z992 Dependence on renal dialysis: Secondary | ICD-10-CM | POA: Insufficient documentation

## 2023-05-13 DIAGNOSIS — D631 Anemia in chronic kidney disease: Secondary | ICD-10-CM | POA: Diagnosis not present

## 2023-05-13 DIAGNOSIS — Z794 Long term (current) use of insulin: Secondary | ICD-10-CM | POA: Diagnosis not present

## 2023-05-13 DIAGNOSIS — Z951 Presence of aortocoronary bypass graft: Secondary | ICD-10-CM | POA: Insufficient documentation

## 2023-05-13 DIAGNOSIS — E785 Hyperlipidemia, unspecified: Secondary | ICD-10-CM | POA: Diagnosis not present

## 2023-05-13 DIAGNOSIS — E039 Hypothyroidism, unspecified: Secondary | ICD-10-CM | POA: Diagnosis not present

## 2023-05-13 DIAGNOSIS — I2510 Atherosclerotic heart disease of native coronary artery without angina pectoris: Secondary | ICD-10-CM | POA: Diagnosis not present

## 2023-05-13 DIAGNOSIS — N250 Renal osteodystrophy: Secondary | ICD-10-CM | POA: Insufficient documentation

## 2023-05-13 DIAGNOSIS — I120 Hypertensive chronic kidney disease with stage 5 chronic kidney disease or end stage renal disease: Secondary | ICD-10-CM | POA: Diagnosis not present

## 2023-05-13 DIAGNOSIS — N186 End stage renal disease: Secondary | ICD-10-CM | POA: Diagnosis not present

## 2023-05-13 DIAGNOSIS — T8249XA Other complication of vascular dialysis catheter, initial encounter: Secondary | ICD-10-CM | POA: Insufficient documentation

## 2023-05-13 DIAGNOSIS — Y839 Surgical procedure, unspecified as the cause of abnormal reaction of the patient, or of later complication, without mention of misadventure at the time of the procedure: Secondary | ICD-10-CM | POA: Diagnosis not present

## 2023-05-13 DIAGNOSIS — E1122 Type 2 diabetes mellitus with diabetic chronic kidney disease: Secondary | ICD-10-CM | POA: Insufficient documentation

## 2023-05-13 HISTORY — PX: DIALYSIS/PERMA CATHETER REMOVAL: CATH118289

## 2023-05-13 HISTORY — PX: PERIPHERAL VASCULAR BALLOON ANGIOPLASTY: CATH118281

## 2023-05-13 HISTORY — PX: DIALYSIS/PERMA CATHETER INSERTION: CATH118288

## 2023-05-13 LAB — POCT I-STAT, CHEM 8
BUN: 23 mg/dL (ref 8–23)
Calcium, Ion: 1.16 mmol/L (ref 1.15–1.40)
Chloride: 91 mmol/L — ABNORMAL LOW (ref 98–111)
Creatinine, Ser: 6.2 mg/dL — ABNORMAL HIGH (ref 0.61–1.24)
Glucose, Bld: 161 mg/dL — ABNORMAL HIGH (ref 70–99)
HCT: 36 % — ABNORMAL LOW (ref 39.0–52.0)
Hemoglobin: 12.2 g/dL — ABNORMAL LOW (ref 13.0–17.0)
Potassium: 3.8 mmol/L (ref 3.5–5.1)
Sodium: 134 mmol/L — ABNORMAL LOW (ref 135–145)
TCO2: 29 mmol/L (ref 22–32)

## 2023-05-13 SURGERY — DIALYSIS/PERMA CATHETER REMOVAL
Anesthesia: LOCAL | Laterality: Right

## 2023-05-13 MED ORDER — LIDOCAINE HCL (PF) 1 % IJ SOLN
INTRAMUSCULAR | Status: AC
  Filled 2023-05-13: qty 30

## 2023-05-13 MED ORDER — LIDOCAINE HCL (PF) 1 % IJ SOLN
INTRAMUSCULAR | Status: DC | PRN
  Administered 2023-05-13: 5 mL via SUBCUTANEOUS

## 2023-05-13 MED ORDER — MIDAZOLAM HCL 2 MG/2ML IJ SOLN
INTRAMUSCULAR | Status: DC | PRN
  Administered 2023-05-13: 1 mg via INTRAVENOUS

## 2023-05-13 MED ORDER — FENTANYL CITRATE (PF) 100 MCG/2ML IJ SOLN
INTRAMUSCULAR | Status: AC
  Filled 2023-05-13: qty 2

## 2023-05-13 MED ORDER — FENTANYL CITRATE (PF) 100 MCG/2ML IJ SOLN
INTRAMUSCULAR | Status: DC | PRN
  Administered 2023-05-13: 50 ug via INTRAVENOUS

## 2023-05-13 MED ORDER — HEPARIN (PORCINE) IN NACL 1000-0.9 UT/500ML-% IV SOLN
INTRAVENOUS | Status: DC | PRN
  Administered 2023-05-13: 500 mL

## 2023-05-13 MED ORDER — SODIUM CHLORIDE 0.9% FLUSH: 3.0000 mL | INTRAVENOUS | Status: DC | PRN

## 2023-05-13 MED ORDER — MIDAZOLAM HCL 2 MG/2ML IJ SOLN
INTRAMUSCULAR | Status: AC
  Filled 2023-05-13: qty 2

## 2023-05-13 MED ORDER — ACETAMINOPHEN 325 MG PO TABS: 650.0000 mg | ORAL_TABLET | ORAL | Status: DC | PRN

## 2023-05-13 MED ORDER — SODIUM CHLORIDE 0.9% FLUSH: 10.0000 mL | Freq: Two times a day (BID) | INTRAVENOUS | Status: DC

## 2023-05-13 MED ORDER — ONDANSETRON HCL 4 MG/2ML IJ SOLN: 4.0000 mg | Freq: Four times a day (QID) | INTRAMUSCULAR | Status: DC | PRN

## 2023-05-13 MED ORDER — IODIXANOL 320 MG/ML IV SOLN
INTRAVENOUS | Status: DC | PRN
  Administered 2023-05-13: 3 mL

## 2023-05-13 SURGICAL SUPPLY — 6 items
BALLN MUSTANG 10.0X40 75 (BALLOONS) ×2
BALLOON MUSTANG 10.0X40 75 (BALLOONS) IMPLANT
CATH PALINDROME SP SLVR 19 14F (CATHETERS) IMPLANT
GUIDEWIRE ANGLED .035X150CM (WIRE) IMPLANT
SYR MEDALLION 10ML (SYRINGE) IMPLANT
TRAY PV CATH (CUSTOM PROCEDURE TRAY) ×2 IMPLANT

## 2023-05-13 NOTE — Discharge Instructions (Signed)

## 2023-05-13 NOTE — Op Note (Signed)
Patient presents with poor flows in his right IJ tunneled hemodialysis catheter (23 cm cuff to tip- Palindrome) that was placed in 2022.. On examination, aspiration from both ports is sluggish with a hard pull.  Chest x-ray confirms the catheter tip is deep in the RA.  The catheter cuff is 1 cm deep to the exit site.    Summary:  1) The patient had a successful exchange from a 23 cm CTT Palindrome hemodialysis catheter to a 19 cm CTT in the  right internal jugular vein. 2) Innominate vein restriction noted which was treated with a 10 mm Mustang balloon angioplasty to full effacement approximately 8 atm of pressure; subsequently angiogram revealed resolution of the restriction with no evidence of extravasation.   3) Great flush and return through both ports; okay to use catheter immediately.  Description of procedure: The  right neck, chest and the catheter were prepped and draped in the usual sterile fashion. The exit site and adjacent tunnel track were anesthetized with lidocaine 1% with epinephrine. The cuff was removed by traction/blunt dissection. Venogram performed by sequentially injecting contrast through both ports demonstrated a significant restriction in the right innominate vein prior to its transition with the SVC. Wire was manipulated and then advanced into the IVC.   The catheter was removed and replaced with a  10x4 Mustang angioplasty balloon. The restriction in the innominate vein stenosis was treated with balloon angioplasty (84696) with the 10x4 Mustang angioplasty balloon to full effacement.   A new 19 cm cuff to tip Palindrome catheter (29528) was then inserted over the wire under real time fluoroscopic guidance; I then injected through the non-wired port confirming the absence of any restriction with 10% residual + no evidence of extravasation. I then advanced the catheter till the tip was in the right atrium, the cuff was 1-2cm deep to the exit site. Aspiration and flushing of both  limbs of the catheter confirmed excellent flush and return through both ports.   The hub was sutured to the right upper chest area with 2-0 nylon sutures. Sterile dressings were placed, and the patient returned to recovery in stable condition.  Sedation: 1 mg Versed, 50 mcg Fentanyl.  Contrast. 3 mL  Monitoring: Because of the patient's comorbid conditions and sedation during the procedure, continuous EKG monitoring and O2 saturation monitoring was performed throughout the procedure by the RN. There were no abnormal arrhythmias encountered.  Complications: None.  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 M0102 Contrast  Recommendations: Remove the suture in 3 weeks. 2.   Report any blood flow problems to  [facility] (contact is Destiny).  Discharge: The patient was discharged home in stable condition. The patient was given education regarding the care of the catheter and specific instructions in case of any problems.

## 2023-05-13 NOTE — H&P (Addendum)
Chief Complaint: Decreased flows  Interval H&P   The patient has presented today for tunneled catheter insertion to initiate dialysis; patient is followed by Dr. Allena Katz in [place].  Various methods of treatment have been discussed with the patient.  After consideration of risk, benefits and other options for treatment, the patient has consented to a tunneled catheter insertion.   Risks  of bleeding, pain, infection, nonhealing wound, lung and carotid artery injury were explained to the patient.  The patient's history has been reviewed and the patient has been examined, no changes in status.  Stable for tunneled catheter insertion.  I have reviewed the patient's chart and labs.  Questions were answered to the patient's satisfaction.  Assessment/Plan: ESRD dialyzing at [facility] kidney center with Dr. Allena Katz Monday Wednesdays and Fridays with last dialysis on Wednesday.  Decreased access flows - planning on catheter exchange with possibly angioplasty; return through the arterial port was sluggish.  Current 19 cm cuff to tip palindrome was placed on September 10, 2020. Renal osteodystrophy - continue binders per home regimen. Anemia - managed with ESA's and IV iron at dialysis center. HTN - resume home regimen. CASHD - h/o CABG; compensated. DM.   HPI: Joseph Mcpherson is an 77 y.o. male  DM, hypertension, CASHD (h/o CABG), hypothyroidism, hyperlipidemia, ESRD dialyzing Monday Wednesdays and Fridays and in [place] with Dr. Allena Katz.  Patient is being referred for poor flows through the right internal jugular tunneled catheter.  Patient used to have a left upper arm AV graft which has since clotted off; patient has since refused placement of a permanent dialysis access.  Pt denies fever, chills, nausea, vomiting, myalgias, SOB, CP.   ROS Per HPI.  Chemistry and CBC: Creatinine  Date/Time Value Ref Range Status  09/14/2013 05:34 AM 1.71 (H) 0.60 - 1.30 mg/dL Final  54/01/8118 14:78 AM 1.84 (H)  0.60 - 1.30 mg/dL Final  29/56/2130 86:57 AM 2.23 (H) 0.60 - 1.30 mg/dL Final  84/69/6295 28:41 PM 2.64 (H) 0.60 - 1.30 mg/dL Final   Creat  Date/Time Value Ref Range Status  02/25/2022 10:48 AM 7.01 (H) 0.70 - 1.28 mg/dL Final   Creatinine, Ser  Date/Time Value Ref Range Status  04/28/2022 12:34 PM 8.27 (H) 0.61 - 1.24 mg/dL Final  32/44/0102 72:53 AM 7.99 (H) 0.61 - 1.24 mg/dL Final  66/44/0347 42:59 PM 5.36 (H) 0.61 - 1.24 mg/dL Final  56/38/7564 33:29 AM 5.66 (H) 0.61 - 1.24 mg/dL Final  51/88/4166 06:30 PM 4.78 (H) 0.61 - 1.24 mg/dL Final  16/05/930 35:57 AM 9.86 (H) 0.61 - 1.24 mg/dL Final  32/20/2542 70:62 PM 9.27 (H) 0.61 - 1.24 mg/dL Final  37/62/8315 17:61 AM 8.68 (H) 0.61 - 1.24 mg/dL Final  60/73/7106 26:94 AM 7.29 (H) 0.61 - 1.24 mg/dL Final  85/46/2703 50:09 PM 6.33 (H) 0.61 - 1.24 mg/dL Final  38/18/2993 71:69 PM 6.31 (H) 0.61 - 1.24 mg/dL Final  67/89/3810 17:51 AM 7.20 (H) 0.61 - 1.24 mg/dL Final  02/58/5277 82:42 AM 5.58 (H) 0.61 - 1.24 mg/dL Final  35/36/1443 15:40 AM 8.71 (H) 0.61 - 1.24 mg/dL Final  08/67/6195 09:32 AM 7.34 (H) 0.61 - 1.24 mg/dL Final  67/04/4579 99:83 AM 6.28 (H) 0.61 - 1.24 mg/dL Final  38/25/0539 76:73 AM 8.16 (H) 0.61 - 1.24 mg/dL Final  41/93/7902 40:97 AM 7.04 (H) 0.61 - 1.24 mg/dL Final  35/32/9924 26:83 AM 7.81 (H) 0.61 - 1.24 mg/dL Final  41/96/2229 79:89 PM 7.28 (H) 0.61 - 1.24 mg/dL Corrected  21/19/4174 08:14 PM 7.00 (  H) 0.61 - 1.24 mg/dL Final  16/02/9603 54:09 AM 6.91 (H) 0.61 - 1.24 mg/dL Final  81/19/1478 29:56 AM 5.46 (H) 0.61 - 1.24 mg/dL Final  21/30/8657 84:69 AM 7.68 (H) 0.61 - 1.24 mg/dL Final  62/95/2841 32:44 AM 6.19 (H) 0.61 - 1.24 mg/dL Final  05/27/7251 66:44 AM 4.72 (H) 0.61 - 1.24 mg/dL Final  03/47/4259 56:38 AM 7.15 (H) 0.61 - 1.24 mg/dL Final  75/64/3329 51:88 AM 5.05 (H) 0.61 - 1.24 mg/dL Final  41/66/0630 16:01 AM 7.39 (H) 0.61 - 1.24 mg/dL Final  09/32/3557 32:20 AM 5.85 (H) 0.61 - 1.24 mg/dL Final   25/42/7062 37:62 AM 9.09 (H) 0.61 - 1.24 mg/dL Final  83/15/1761 60:73 AM 13.80 (H) 0.61 - 1.24 mg/dL Final  71/10/2692 85:46 PM 7.50 (H) 0.61 - 1.24 mg/dL Final  27/07/5007 38:18 PM 7.68 (H) 0.61 - 1.24 mg/dL Final  29/93/7169 67:89 PM 7.42 (H) 0.61 - 1.24 mg/dL Final  38/02/1750 02:58 PM 6.70 (H) 0.61 - 1.24 mg/dL Final  52/77/8242 35:36 PM 7.05 (H) 0.61 - 1.24 mg/dL Final   No results for input(s): "NA", "K", "CL", "CO2", "GLUCOSE", "BUN", "CREATININE", "CALCIUM", "PHOS" in the last 168 hours.  Invalid input(s): "ALB" No results for input(s): "WBC", "NEUTROABS", "HGB", "HCT", "MCV", "PLT" in the last 168 hours. Liver Function Tests: No results for input(s): "AST", "ALT", "ALKPHOS", "BILITOT", "PROT", "ALBUMIN" in the last 168 hours. No results for input(s): "LIPASE", "AMYLASE" in the last 168 hours. No results for input(s): "AMMONIA" in the last 168 hours. Cardiac Enzymes: No results for input(s): "CKTOTAL", "CKMB", "CKMBINDEX", "TROPONINI" in the last 168 hours. Iron Studies: No results for input(s): "IRON", "TIBC", "TRANSFERRIN", "FERRITIN" in the last 72 hours. PT/INR: @LABRCNTIP (inr:5)  Xrays/Other Studies: )No results found for this or any previous visit (from the past 48 hours). No results found.  PMH:   Past Medical History:  Diagnosis Date   Clotting disorder (HCC)    per daughter, "when patient travels, his feet swell"   Diabetes mellitus without complication (HCC)    type II   Diabetic eye exam (HCC) 12/2019   ESRD (end stage renal disease) (HCC)    MWF- [place]   GERD (gastroesophageal reflux disease)    [name] (hard of hearing)    Hyperlipidemia    Hypertension    Hypothyroidism    Thyroid disease     PSH:   Past Surgical History:  Procedure Laterality Date   AV FISTULA PLACEMENT Right 10/22/2017   Procedure: ARTERIOVENOUS (AV) BRACHIOCEPHALIC FISTULA CREATION RIGHT UPPER ARM;  Surgeon: Fransisco Hertz, MD;  Location: [facility] OR;  Service: Vascular;   Laterality: Right;   AV FISTULA PLACEMENT Left 02/03/2019   Procedure: INSERTION OF ARTERIOVENOUS (AV) GORE-TEX GRAFT ARM (BRACHIAL AXILLARY );  Surgeon: Renford Dills, MD;  Location: [facility] ORS;  Service: Vascular;  Laterality: Left;   AV FISTULA PLACEMENT Left 12/10/2020   Procedure: LEFT ARM BRACHIOCEPHALIC ARTERIOVENOUS (AV) FISTULA CREATION;  Surgeon: Sherren Kerns, MD;  Location: [facility] OR;  Service: Vascular;  Laterality: Left;   IR CHOLANGIOGRAM EXISTING TUBE  03/18/2022   IR PERC CHOLECYSTOSTOMY  01/21/2022   LEFT HEART CATH AND CORONARY ANGIOGRAPHY N/A 04/22/2022   Procedure: LEFT HEART CATH AND CORONARY ANGIOGRAPHY;  Surgeon: Marcina Millard, MD;  Location: [facility] INVASIVE CV LAB;  Service: Cardiovascular;  Laterality: N/A;   UPPER EXTREMITY ANGIOGRAPHY Left 09/19/2019   Procedure: UPPER EXTREMITY ANGIOGRAPHY;  Surgeon: Renford Dills, MD;  Location: [facility] INVASIVE CV LAB;  Service: Cardiovascular;  Laterality:  Left;    Allergies: No Known Allergies  Medications:   Prior to Admission medications   Medication Sig Start Date End Date Taking? Authorizing Provider  albuterol (PROVENTIL) (2.5 MG/3ML) 0.083% nebulizer solution Take 3 mLs (2.5 mg total) by nebulization every 6 (six) hours as needed for wheezing or shortness of breath. 03/13/22  Yes Arnetha Courser, MD  albuterol (VENTOLIN HFA) 108 (90 Base) MCG/ACT inhaler Inhale 2 puffs into the lungs every 4 (four) hours as needed for wheezing or shortness of breath. 08/06/22  Yes [provider]  amLODipine (NORVASC) 10 MG tablet Take 1 tablet (10 mg total) by mouth daily. 02/02/22  Yes Enedina Finner, MD  aspirin EC 81 MG tablet Take 81 mg by mouth daily. Swallow whole.   Yes [provider]  atorvastatin (LIPITOR) 80 MG tablet Take 80 mg by mouth every evening.   Yes [provider]  B Complex-C-Folic Acid (RENA-VITE RX) 1 MG TABS Take 1 tablet by mouth daily.    Yes [provider]  calcium acetate  (PHOSLO) 667 MG capsule TAKE 2 CAPSULES BY MOUTH 3 TIMES A DAY WITH MEAL Patient taking differently: Take 1,334 mg by mouth 2 (two) times daily with a meal. 02/16/22  Yes Masoud, Renda Rolls, MD  carvedilol (COREG) 25 MG tablet Take 12.5 mg by mouth 2 (two) times daily with a meal.   Yes [provider]  Cholecalciferol (VITAMIN D3) 5000 units TABS Take 5,000 Units by mouth daily.    Yes [provider]  clopidogrel (PLAVIX) 75 MG tablet Take 1 tablet (75 mg total) by mouth daily. 03/14/22  Yes Arnetha Courser, MD  cyanocobalamin (VITAMIN B12) 1000 MCG tablet Take 1,000 mcg by mouth daily.   Yes [provider]  donepezil (ARICEPT) 10 MG tablet TAKE 1 TABLET BY MOUTH EVERY DAY 04/22/21  Yes Masoud, Renda Rolls, MD  gabapentin (NEURONTIN) 100 MG capsule Take 100 mg by mouth at bedtime.   Yes [provider]  Insulin Aspart FlexPen (NOVOLOG) 100 UNIT/ML Inject 8-10 Units into the skin daily as needed (high blood sugar). 08/06/22 03/17/24 Yes [provider]  insulin detemir (LEVEMIR) 100 UNIT/ML injection Inject 0.15 mLs (15 Units total) into the skin at bedtime. Patient taking differently: Inject 4 Units into the skin at bedtime as needed (high blood sugar). 02/02/22  Yes Enedina Finner, MD  ipratropium-albuterol (DUONEB) 0.5-2.5 (3) MG/3ML SOLN Take 3 mLs by nebulization every 6 (six) hours as needed. 03/05/22  Yes Masoud, Renda Rolls, MD  levocetirizine (XYZAL) 5 MG tablet Take 5 mg by mouth every evening.   Yes [provider]  levothyroxine (SYNTHROID) 137 MCG tablet TAKE 1 TABLET BY MOUTH EVERY DAY 03/24/21  Yes Masoud, Renda Rolls, MD  losartan (COZAAR) 25 MG tablet Take 25 mg by mouth daily.   Yes [provider]  pantoprazole (PROTONIX) 40 MG tablet TAKE 1 TABLET BY MOUTH EVERY DAY 07/09/21  Yes Masoud, Renda Rolls, MD  BD PEN NEEDLE NANO 2ND GEN 32G X 4 MM MISC USE AS DIRECTED 02/26/22   Corky Downs, MD  Continuous Blood Gluc Sensor (FREESTYLE LIBRE 14 DAY SENSOR)  MISC APPLY EVERY 14 (FOURTEEN) DAYS. 02/02/22   Corky Downs, MD    Discontinued Meds:   Medications Discontinued During This Encounter  Medication Reason   atorvastatin (LIPITOR) 40 MG tablet Change in therapy   losartan (COZAAR) 100 MG tablet Change in therapy   metoprolol succinate (TOPROL-XL) 100 MG 24 hr tablet Change in therapy   levETIRAcetam (KEPPRA) 500 MG  tablet Change in therapy   levETIRAcetam (KEPPRA) 250 MG tablet Discontinued by provider   multivitamin (RENA-VIT) TABS tablet Patient Preference   sitaGLIPtin (JANUVIA) 50 MG tablet Change in therapy   sevelamer carbonate (RENVELA) 2.4 g PACK Change in therapy   hydrALAZINE (APRESOLINE) 25 MG tablet Patient Preference    Social History:  reports that he has never smoked. He has quit using smokeless tobacco. He reports that he does not drink alcohol and does not use drugs.  Family History:   Family History  Problem Relation Age of Onset   Diabetes Mother    Hyperlipidemia Mother    Hypertension Father     Blood pressure (!) 198/84, pulse 61, resp. rate 14, height 5\' 4"  (1.626 m), weight 61.2 kg, SpO2 100%. GEN: NAD, A&Ox3, NCAT HEENT: No conjunctival pallor, EOMI NECK: Supple, no thyromegaly LUNGS: CTA B/L no rales, rhonchi or wheezing CV: RRR, No M/R/G ABD: SNDNT +BS  EXT: No lower extremity edema ACCESS: Thrombosed  left upper arm AV graft, right internal jugular tunneled catheter       Celestine Prim, Len Blalock, MD 05/13/2023, 12:11 PM

## 2023-05-14 ENCOUNTER — Encounter (HOSPITAL_COMMUNITY): Payer: Self-pay | Admitting: Nephrology

## 2023-05-28 DIAGNOSIS — Z992 Dependence on renal dialysis: Secondary | ICD-10-CM | POA: Diagnosis not present

## 2023-05-28 DIAGNOSIS — N186 End stage renal disease: Secondary | ICD-10-CM | POA: Diagnosis not present

## 2023-05-28 DIAGNOSIS — N2581 Secondary hyperparathyroidism of renal origin: Secondary | ICD-10-CM | POA: Diagnosis not present

## 2023-05-31 DIAGNOSIS — N186 End stage renal disease: Secondary | ICD-10-CM | POA: Diagnosis not present

## 2023-05-31 DIAGNOSIS — Z992 Dependence on renal dialysis: Secondary | ICD-10-CM | POA: Diagnosis not present

## 2023-05-31 DIAGNOSIS — N2581 Secondary hyperparathyroidism of renal origin: Secondary | ICD-10-CM | POA: Diagnosis not present

## 2023-05-31 DIAGNOSIS — E8770 Fluid overload, unspecified: Secondary | ICD-10-CM | POA: Diagnosis not present

## 2023-06-06 ENCOUNTER — Other Ambulatory Visit: Payer: Self-pay | Admitting: Internal Medicine

## 2023-06-07 DIAGNOSIS — Z992 Dependence on renal dialysis: Secondary | ICD-10-CM | POA: Diagnosis not present

## 2023-06-07 DIAGNOSIS — N186 End stage renal disease: Secondary | ICD-10-CM | POA: Diagnosis not present

## 2023-06-07 DIAGNOSIS — D631 Anemia in chronic kidney disease: Secondary | ICD-10-CM | POA: Diagnosis not present

## 2023-06-07 DIAGNOSIS — N2581 Secondary hyperparathyroidism of renal origin: Secondary | ICD-10-CM | POA: Diagnosis not present

## 2023-06-14 DIAGNOSIS — Z992 Dependence on renal dialysis: Secondary | ICD-10-CM | POA: Diagnosis not present

## 2023-06-14 DIAGNOSIS — N186 End stage renal disease: Secondary | ICD-10-CM | POA: Diagnosis not present

## 2023-06-14 DIAGNOSIS — N2581 Secondary hyperparathyroidism of renal origin: Secondary | ICD-10-CM | POA: Diagnosis not present

## 2023-06-21 DIAGNOSIS — N186 End stage renal disease: Secondary | ICD-10-CM | POA: Diagnosis not present

## 2023-06-21 DIAGNOSIS — Z992 Dependence on renal dialysis: Secondary | ICD-10-CM | POA: Diagnosis not present

## 2023-06-21 DIAGNOSIS — N2581 Secondary hyperparathyroidism of renal origin: Secondary | ICD-10-CM | POA: Diagnosis not present

## 2023-06-25 DIAGNOSIS — N186 End stage renal disease: Secondary | ICD-10-CM | POA: Diagnosis not present

## 2023-06-25 DIAGNOSIS — Z992 Dependence on renal dialysis: Secondary | ICD-10-CM | POA: Diagnosis not present

## 2023-06-25 DIAGNOSIS — E1122 Type 2 diabetes mellitus with diabetic chronic kidney disease: Secondary | ICD-10-CM | POA: Diagnosis not present

## 2023-08-11 DIAGNOSIS — N2581 Secondary hyperparathyroidism of renal origin: Secondary | ICD-10-CM | POA: Diagnosis not present

## 2023-08-11 DIAGNOSIS — E1129 Type 2 diabetes mellitus with other diabetic kidney complication: Secondary | ICD-10-CM | POA: Diagnosis not present

## 2023-08-11 DIAGNOSIS — N186 End stage renal disease: Secondary | ICD-10-CM | POA: Diagnosis not present

## 2023-08-11 DIAGNOSIS — Z992 Dependence on renal dialysis: Secondary | ICD-10-CM | POA: Diagnosis not present

## 2023-08-17 DIAGNOSIS — E782 Mixed hyperlipidemia: Secondary | ICD-10-CM | POA: Diagnosis not present

## 2023-08-17 DIAGNOSIS — I480 Paroxysmal atrial fibrillation: Secondary | ICD-10-CM | POA: Diagnosis not present

## 2023-08-17 DIAGNOSIS — I5020 Unspecified systolic (congestive) heart failure: Secondary | ICD-10-CM | POA: Diagnosis not present

## 2023-08-17 DIAGNOSIS — Z8673 Personal history of transient ischemic attack (TIA), and cerebral infarction without residual deficits: Secondary | ICD-10-CM | POA: Diagnosis not present

## 2023-08-17 DIAGNOSIS — I6381 Other cerebral infarction due to occlusion or stenosis of small artery: Secondary | ICD-10-CM | POA: Diagnosis not present

## 2023-08-17 DIAGNOSIS — I10 Essential (primary) hypertension: Secondary | ICD-10-CM | POA: Diagnosis not present

## 2023-08-17 DIAGNOSIS — Z951 Presence of aortocoronary bypass graft: Secondary | ICD-10-CM | POA: Diagnosis not present

## 2023-08-17 DIAGNOSIS — Z9889 Other specified postprocedural states: Secondary | ICD-10-CM | POA: Diagnosis not present

## 2023-08-17 DIAGNOSIS — N186 End stage renal disease: Secondary | ICD-10-CM | POA: Diagnosis not present

## 2023-08-23 DIAGNOSIS — N2581 Secondary hyperparathyroidism of renal origin: Secondary | ICD-10-CM | POA: Diagnosis not present

## 2023-08-23 DIAGNOSIS — E1122 Type 2 diabetes mellitus with diabetic chronic kidney disease: Secondary | ICD-10-CM | POA: Diagnosis not present

## 2023-08-23 DIAGNOSIS — D689 Coagulation defect, unspecified: Secondary | ICD-10-CM | POA: Diagnosis not present

## 2023-08-23 DIAGNOSIS — Z992 Dependence on renal dialysis: Secondary | ICD-10-CM | POA: Diagnosis not present

## 2023-08-23 DIAGNOSIS — N186 End stage renal disease: Secondary | ICD-10-CM | POA: Diagnosis not present

## 2023-08-25 DIAGNOSIS — D689 Coagulation defect, unspecified: Secondary | ICD-10-CM | POA: Diagnosis not present

## 2023-08-25 DIAGNOSIS — N2581 Secondary hyperparathyroidism of renal origin: Secondary | ICD-10-CM | POA: Diagnosis not present

## 2023-08-25 DIAGNOSIS — Z992 Dependence on renal dialysis: Secondary | ICD-10-CM | POA: Diagnosis not present

## 2023-08-25 DIAGNOSIS — D631 Anemia in chronic kidney disease: Secondary | ICD-10-CM | POA: Diagnosis not present

## 2023-08-25 DIAGNOSIS — N186 End stage renal disease: Secondary | ICD-10-CM | POA: Diagnosis not present

## 2023-08-30 DIAGNOSIS — D509 Iron deficiency anemia, unspecified: Secondary | ICD-10-CM | POA: Diagnosis not present

## 2023-08-30 DIAGNOSIS — N2581 Secondary hyperparathyroidism of renal origin: Secondary | ICD-10-CM | POA: Diagnosis not present

## 2023-08-30 DIAGNOSIS — E8770 Fluid overload, unspecified: Secondary | ICD-10-CM | POA: Diagnosis not present

## 2023-08-30 DIAGNOSIS — N186 End stage renal disease: Secondary | ICD-10-CM | POA: Diagnosis not present

## 2023-08-30 DIAGNOSIS — Z992 Dependence on renal dialysis: Secondary | ICD-10-CM | POA: Diagnosis not present

## 2023-08-30 DIAGNOSIS — D689 Coagulation defect, unspecified: Secondary | ICD-10-CM | POA: Diagnosis not present

## 2023-09-01 DIAGNOSIS — Z992 Dependence on renal dialysis: Secondary | ICD-10-CM | POA: Diagnosis not present

## 2023-09-01 DIAGNOSIS — N186 End stage renal disease: Secondary | ICD-10-CM | POA: Diagnosis not present

## 2023-09-01 DIAGNOSIS — N2581 Secondary hyperparathyroidism of renal origin: Secondary | ICD-10-CM | POA: Diagnosis not present

## 2023-09-06 DIAGNOSIS — D509 Iron deficiency anemia, unspecified: Secondary | ICD-10-CM | POA: Diagnosis not present

## 2023-09-06 DIAGNOSIS — D689 Coagulation defect, unspecified: Secondary | ICD-10-CM | POA: Diagnosis not present

## 2023-09-06 DIAGNOSIS — N186 End stage renal disease: Secondary | ICD-10-CM | POA: Diagnosis not present

## 2023-09-06 DIAGNOSIS — Z992 Dependence on renal dialysis: Secondary | ICD-10-CM | POA: Diagnosis not present

## 2023-09-06 DIAGNOSIS — N2581 Secondary hyperparathyroidism of renal origin: Secondary | ICD-10-CM | POA: Diagnosis not present

## 2023-09-06 DIAGNOSIS — D631 Anemia in chronic kidney disease: Secondary | ICD-10-CM | POA: Diagnosis not present

## 2023-09-13 DIAGNOSIS — N186 End stage renal disease: Secondary | ICD-10-CM | POA: Diagnosis not present

## 2023-09-13 DIAGNOSIS — Z992 Dependence on renal dialysis: Secondary | ICD-10-CM | POA: Diagnosis not present

## 2023-09-13 DIAGNOSIS — N2581 Secondary hyperparathyroidism of renal origin: Secondary | ICD-10-CM | POA: Diagnosis not present

## 2023-09-13 DIAGNOSIS — D689 Coagulation defect, unspecified: Secondary | ICD-10-CM | POA: Diagnosis not present

## 2023-09-13 DIAGNOSIS — D509 Iron deficiency anemia, unspecified: Secondary | ICD-10-CM | POA: Diagnosis not present

## 2023-09-16 DIAGNOSIS — I6381 Other cerebral infarction due to occlusion or stenosis of small artery: Secondary | ICD-10-CM | POA: Diagnosis not present

## 2023-09-16 DIAGNOSIS — Z951 Presence of aortocoronary bypass graft: Secondary | ICD-10-CM | POA: Diagnosis not present

## 2023-09-16 DIAGNOSIS — N186 End stage renal disease: Secondary | ICD-10-CM | POA: Diagnosis not present

## 2023-09-16 DIAGNOSIS — E0842 Diabetes mellitus due to underlying condition with diabetic polyneuropathy: Secondary | ICD-10-CM | POA: Diagnosis not present

## 2023-09-16 DIAGNOSIS — I10 Essential (primary) hypertension: Secondary | ICD-10-CM | POA: Diagnosis not present

## 2023-09-16 DIAGNOSIS — E119 Type 2 diabetes mellitus without complications: Secondary | ICD-10-CM | POA: Diagnosis not present

## 2023-09-16 DIAGNOSIS — I480 Paroxysmal atrial fibrillation: Secondary | ICD-10-CM | POA: Diagnosis not present

## 2023-09-16 DIAGNOSIS — I5020 Unspecified systolic (congestive) heart failure: Secondary | ICD-10-CM | POA: Diagnosis not present

## 2023-09-16 DIAGNOSIS — Z8673 Personal history of transient ischemic attack (TIA), and cerebral infarction without residual deficits: Secondary | ICD-10-CM | POA: Diagnosis not present

## 2023-09-16 DIAGNOSIS — E782 Mixed hyperlipidemia: Secondary | ICD-10-CM | POA: Diagnosis not present

## 2023-09-16 DIAGNOSIS — Z9889 Other specified postprocedural states: Secondary | ICD-10-CM | POA: Diagnosis not present

## 2023-09-16 DIAGNOSIS — I519 Heart disease, unspecified: Secondary | ICD-10-CM | POA: Diagnosis not present

## 2023-09-20 DIAGNOSIS — D689 Coagulation defect, unspecified: Secondary | ICD-10-CM | POA: Diagnosis not present

## 2023-09-20 DIAGNOSIS — Z992 Dependence on renal dialysis: Secondary | ICD-10-CM | POA: Diagnosis not present

## 2023-09-20 DIAGNOSIS — D509 Iron deficiency anemia, unspecified: Secondary | ICD-10-CM | POA: Diagnosis not present

## 2023-09-20 DIAGNOSIS — N2581 Secondary hyperparathyroidism of renal origin: Secondary | ICD-10-CM | POA: Diagnosis not present

## 2023-09-20 DIAGNOSIS — N186 End stage renal disease: Secondary | ICD-10-CM | POA: Diagnosis not present

## 2023-09-22 DIAGNOSIS — N186 End stage renal disease: Secondary | ICD-10-CM | POA: Diagnosis not present

## 2023-09-22 DIAGNOSIS — E1122 Type 2 diabetes mellitus with diabetic chronic kidney disease: Secondary | ICD-10-CM | POA: Diagnosis not present

## 2023-09-22 DIAGNOSIS — Z992 Dependence on renal dialysis: Secondary | ICD-10-CM | POA: Diagnosis not present

## 2023-09-24 DIAGNOSIS — Z992 Dependence on renal dialysis: Secondary | ICD-10-CM | POA: Diagnosis not present

## 2023-09-24 DIAGNOSIS — N2581 Secondary hyperparathyroidism of renal origin: Secondary | ICD-10-CM | POA: Diagnosis not present

## 2023-09-24 DIAGNOSIS — N186 End stage renal disease: Secondary | ICD-10-CM | POA: Diagnosis not present

## 2023-09-24 DIAGNOSIS — D509 Iron deficiency anemia, unspecified: Secondary | ICD-10-CM | POA: Diagnosis not present

## 2023-09-24 DIAGNOSIS — D689 Coagulation defect, unspecified: Secondary | ICD-10-CM | POA: Diagnosis not present

## 2023-09-27 DIAGNOSIS — N186 End stage renal disease: Secondary | ICD-10-CM | POA: Diagnosis not present

## 2023-09-27 DIAGNOSIS — Z992 Dependence on renal dialysis: Secondary | ICD-10-CM | POA: Diagnosis not present

## 2023-09-27 DIAGNOSIS — D631 Anemia in chronic kidney disease: Secondary | ICD-10-CM | POA: Diagnosis not present

## 2023-09-27 DIAGNOSIS — D689 Coagulation defect, unspecified: Secondary | ICD-10-CM | POA: Diagnosis not present

## 2023-09-27 DIAGNOSIS — D509 Iron deficiency anemia, unspecified: Secondary | ICD-10-CM | POA: Diagnosis not present

## 2023-09-27 DIAGNOSIS — N2581 Secondary hyperparathyroidism of renal origin: Secondary | ICD-10-CM | POA: Diagnosis not present

## 2023-10-04 DIAGNOSIS — N186 End stage renal disease: Secondary | ICD-10-CM | POA: Diagnosis not present

## 2023-10-04 DIAGNOSIS — N2581 Secondary hyperparathyroidism of renal origin: Secondary | ICD-10-CM | POA: Diagnosis not present

## 2023-10-04 DIAGNOSIS — Z992 Dependence on renal dialysis: Secondary | ICD-10-CM | POA: Diagnosis not present

## 2023-10-04 DIAGNOSIS — D509 Iron deficiency anemia, unspecified: Secondary | ICD-10-CM | POA: Diagnosis not present

## 2023-10-04 DIAGNOSIS — D689 Coagulation defect, unspecified: Secondary | ICD-10-CM | POA: Diagnosis not present

## 2023-10-11 DIAGNOSIS — N2581 Secondary hyperparathyroidism of renal origin: Secondary | ICD-10-CM | POA: Diagnosis not present

## 2023-10-11 DIAGNOSIS — N186 End stage renal disease: Secondary | ICD-10-CM | POA: Diagnosis not present

## 2023-10-11 DIAGNOSIS — Z992 Dependence on renal dialysis: Secondary | ICD-10-CM | POA: Diagnosis not present

## 2023-10-11 DIAGNOSIS — D689 Coagulation defect, unspecified: Secondary | ICD-10-CM | POA: Diagnosis not present

## 2023-10-11 DIAGNOSIS — D509 Iron deficiency anemia, unspecified: Secondary | ICD-10-CM | POA: Diagnosis not present

## 2023-10-18 DIAGNOSIS — N186 End stage renal disease: Secondary | ICD-10-CM | POA: Diagnosis not present

## 2023-10-18 DIAGNOSIS — Z992 Dependence on renal dialysis: Secondary | ICD-10-CM | POA: Diagnosis not present

## 2023-10-18 DIAGNOSIS — D509 Iron deficiency anemia, unspecified: Secondary | ICD-10-CM | POA: Diagnosis not present

## 2023-10-18 DIAGNOSIS — D689 Coagulation defect, unspecified: Secondary | ICD-10-CM | POA: Diagnosis not present

## 2023-10-18 DIAGNOSIS — N2581 Secondary hyperparathyroidism of renal origin: Secondary | ICD-10-CM | POA: Diagnosis not present

## 2023-10-23 DIAGNOSIS — E1122 Type 2 diabetes mellitus with diabetic chronic kidney disease: Secondary | ICD-10-CM | POA: Diagnosis not present

## 2023-10-23 DIAGNOSIS — N186 End stage renal disease: Secondary | ICD-10-CM | POA: Diagnosis not present

## 2023-10-23 DIAGNOSIS — Z992 Dependence on renal dialysis: Secondary | ICD-10-CM | POA: Diagnosis not present

## 2023-10-25 DIAGNOSIS — D509 Iron deficiency anemia, unspecified: Secondary | ICD-10-CM | POA: Diagnosis not present

## 2023-10-25 DIAGNOSIS — D689 Coagulation defect, unspecified: Secondary | ICD-10-CM | POA: Diagnosis not present

## 2023-10-25 DIAGNOSIS — Z992 Dependence on renal dialysis: Secondary | ICD-10-CM | POA: Diagnosis not present

## 2023-10-25 DIAGNOSIS — N2581 Secondary hyperparathyroidism of renal origin: Secondary | ICD-10-CM | POA: Diagnosis not present

## 2023-10-25 DIAGNOSIS — N186 End stage renal disease: Secondary | ICD-10-CM | POA: Diagnosis not present

## 2023-10-25 DIAGNOSIS — E1129 Type 2 diabetes mellitus with other diabetic kidney complication: Secondary | ICD-10-CM | POA: Diagnosis not present

## 2023-11-01 DIAGNOSIS — Z992 Dependence on renal dialysis: Secondary | ICD-10-CM | POA: Diagnosis not present

## 2023-11-01 DIAGNOSIS — N2581 Secondary hyperparathyroidism of renal origin: Secondary | ICD-10-CM | POA: Diagnosis not present

## 2023-11-01 DIAGNOSIS — D689 Coagulation defect, unspecified: Secondary | ICD-10-CM | POA: Diagnosis not present

## 2023-11-01 DIAGNOSIS — D509 Iron deficiency anemia, unspecified: Secondary | ICD-10-CM | POA: Diagnosis not present

## 2023-11-01 DIAGNOSIS — N186 End stage renal disease: Secondary | ICD-10-CM | POA: Diagnosis not present

## 2023-11-08 DIAGNOSIS — D631 Anemia in chronic kidney disease: Secondary | ICD-10-CM | POA: Diagnosis not present

## 2023-11-08 DIAGNOSIS — Z992 Dependence on renal dialysis: Secondary | ICD-10-CM | POA: Diagnosis not present

## 2023-11-08 DIAGNOSIS — N186 End stage renal disease: Secondary | ICD-10-CM | POA: Diagnosis not present

## 2023-11-08 DIAGNOSIS — N2581 Secondary hyperparathyroidism of renal origin: Secondary | ICD-10-CM | POA: Diagnosis not present

## 2023-11-08 DIAGNOSIS — D689 Coagulation defect, unspecified: Secondary | ICD-10-CM | POA: Diagnosis not present

## 2023-11-08 DIAGNOSIS — D509 Iron deficiency anemia, unspecified: Secondary | ICD-10-CM | POA: Diagnosis not present

## 2023-11-10 DIAGNOSIS — Z8673 Personal history of transient ischemic attack (TIA), and cerebral infarction without residual deficits: Secondary | ICD-10-CM | POA: Diagnosis not present

## 2023-11-10 DIAGNOSIS — I6381 Other cerebral infarction due to occlusion or stenosis of small artery: Secondary | ICD-10-CM | POA: Diagnosis not present

## 2023-11-10 DIAGNOSIS — Z1331 Encounter for screening for depression: Secondary | ICD-10-CM | POA: Diagnosis not present

## 2023-11-10 DIAGNOSIS — G939 Disorder of brain, unspecified: Secondary | ICD-10-CM | POA: Diagnosis not present

## 2023-11-10 DIAGNOSIS — R4189 Other symptoms and signs involving cognitive functions and awareness: Secondary | ICD-10-CM | POA: Diagnosis not present

## 2023-11-10 DIAGNOSIS — E0842 Diabetes mellitus due to underlying condition with diabetic polyneuropathy: Secondary | ICD-10-CM | POA: Diagnosis not present

## 2023-11-15 ENCOUNTER — Other Ambulatory Visit: Payer: Self-pay | Admitting: Neurology

## 2023-11-15 DIAGNOSIS — N2581 Secondary hyperparathyroidism of renal origin: Secondary | ICD-10-CM | POA: Diagnosis not present

## 2023-11-15 DIAGNOSIS — R4189 Other symptoms and signs involving cognitive functions and awareness: Secondary | ICD-10-CM

## 2023-11-15 DIAGNOSIS — D689 Coagulation defect, unspecified: Secondary | ICD-10-CM | POA: Diagnosis not present

## 2023-11-15 DIAGNOSIS — D509 Iron deficiency anemia, unspecified: Secondary | ICD-10-CM | POA: Diagnosis not present

## 2023-11-15 DIAGNOSIS — Z992 Dependence on renal dialysis: Secondary | ICD-10-CM | POA: Diagnosis not present

## 2023-11-15 DIAGNOSIS — N186 End stage renal disease: Secondary | ICD-10-CM | POA: Diagnosis not present

## 2023-11-16 ENCOUNTER — Other Ambulatory Visit

## 2023-11-16 ENCOUNTER — Ambulatory Visit
Admission: RE | Admit: 2023-11-16 | Discharge: 2023-11-16 | Disposition: A | Discharge status: Home / Self Care | Source: Ambulatory Visit | Attending: Neurology | Admitting: Neurology

## 2023-11-16 DIAGNOSIS — I6782 Cerebral ischemia: Secondary | ICD-10-CM | POA: Diagnosis not present

## 2023-11-16 DIAGNOSIS — R9089 Other abnormal findings on diagnostic imaging of central nervous system: Secondary | ICD-10-CM | POA: Diagnosis not present

## 2023-11-16 DIAGNOSIS — G9389 Other specified disorders of brain: Secondary | ICD-10-CM | POA: Diagnosis not present

## 2023-11-16 DIAGNOSIS — R29898 Other symptoms and signs involving the musculoskeletal system: Secondary | ICD-10-CM | POA: Diagnosis not present

## 2023-11-16 DIAGNOSIS — R4189 Other symptoms and signs involving cognitive functions and awareness: Secondary | ICD-10-CM | POA: Insufficient documentation

## 2023-11-18 DIAGNOSIS — F0390 Unspecified dementia without behavioral disturbance: Secondary | ICD-10-CM | POA: Diagnosis not present

## 2023-11-18 DIAGNOSIS — I4891 Unspecified atrial fibrillation: Secondary | ICD-10-CM | POA: Diagnosis not present

## 2023-11-18 DIAGNOSIS — Z794 Long term (current) use of insulin: Secondary | ICD-10-CM | POA: Diagnosis not present

## 2023-11-18 DIAGNOSIS — Z7984 Long term (current) use of oral hypoglycemic drugs: Secondary | ICD-10-CM | POA: Diagnosis not present

## 2023-11-18 DIAGNOSIS — I252 Old myocardial infarction: Secondary | ICD-10-CM | POA: Diagnosis not present

## 2023-11-18 DIAGNOSIS — Z7982 Long term (current) use of aspirin: Secondary | ICD-10-CM | POA: Diagnosis not present

## 2023-11-18 DIAGNOSIS — E039 Hypothyroidism, unspecified: Secondary | ICD-10-CM | POA: Diagnosis not present

## 2023-11-18 DIAGNOSIS — R29898 Other symptoms and signs involving the musculoskeletal system: Secondary | ICD-10-CM | POA: Diagnosis not present

## 2023-11-18 DIAGNOSIS — E782 Mixed hyperlipidemia: Secondary | ICD-10-CM | POA: Diagnosis not present

## 2023-11-18 DIAGNOSIS — E1142 Type 2 diabetes mellitus with diabetic polyneuropathy: Secondary | ICD-10-CM | POA: Diagnosis not present

## 2023-11-18 DIAGNOSIS — I69398 Other sequelae of cerebral infarction: Secondary | ICD-10-CM | POA: Diagnosis not present

## 2023-11-18 DIAGNOSIS — Z9181 History of falling: Secondary | ICD-10-CM | POA: Diagnosis not present

## 2023-11-18 DIAGNOSIS — Z951 Presence of aortocoronary bypass graft: Secondary | ICD-10-CM | POA: Diagnosis not present

## 2023-11-18 DIAGNOSIS — E1122 Type 2 diabetes mellitus with diabetic chronic kidney disease: Secondary | ICD-10-CM | POA: Diagnosis not present

## 2023-11-18 DIAGNOSIS — K219 Gastro-esophageal reflux disease without esophagitis: Secondary | ICD-10-CM | POA: Diagnosis not present

## 2023-11-18 DIAGNOSIS — I5022 Chronic systolic (congestive) heart failure: Secondary | ICD-10-CM | POA: Diagnosis not present

## 2023-11-18 DIAGNOSIS — M109 Gout, unspecified: Secondary | ICD-10-CM | POA: Diagnosis not present

## 2023-11-18 DIAGNOSIS — Z7902 Long term (current) use of antithrombotics/antiplatelets: Secondary | ICD-10-CM | POA: Diagnosis not present

## 2023-11-18 DIAGNOSIS — N186 End stage renal disease: Secondary | ICD-10-CM | POA: Diagnosis not present

## 2023-11-18 DIAGNOSIS — Z992 Dependence on renal dialysis: Secondary | ICD-10-CM | POA: Diagnosis not present

## 2023-11-22 DIAGNOSIS — D689 Coagulation defect, unspecified: Secondary | ICD-10-CM | POA: Diagnosis not present

## 2023-11-22 DIAGNOSIS — Z992 Dependence on renal dialysis: Secondary | ICD-10-CM | POA: Diagnosis not present

## 2023-11-22 DIAGNOSIS — E1122 Type 2 diabetes mellitus with diabetic chronic kidney disease: Secondary | ICD-10-CM | POA: Diagnosis not present

## 2023-11-22 DIAGNOSIS — N186 End stage renal disease: Secondary | ICD-10-CM | POA: Diagnosis not present

## 2023-11-22 DIAGNOSIS — N2581 Secondary hyperparathyroidism of renal origin: Secondary | ICD-10-CM | POA: Diagnosis not present

## 2023-11-24 DIAGNOSIS — N186 End stage renal disease: Secondary | ICD-10-CM | POA: Diagnosis not present

## 2023-11-24 DIAGNOSIS — N2581 Secondary hyperparathyroidism of renal origin: Secondary | ICD-10-CM | POA: Diagnosis not present

## 2023-11-24 DIAGNOSIS — Z992 Dependence on renal dialysis: Secondary | ICD-10-CM | POA: Diagnosis not present

## 2023-11-24 DIAGNOSIS — D631 Anemia in chronic kidney disease: Secondary | ICD-10-CM | POA: Diagnosis not present

## 2023-11-24 DIAGNOSIS — D689 Coagulation defect, unspecified: Secondary | ICD-10-CM | POA: Diagnosis not present

## 2023-11-24 DIAGNOSIS — D509 Iron deficiency anemia, unspecified: Secondary | ICD-10-CM | POA: Diagnosis not present

## 2023-11-29 DIAGNOSIS — D509 Iron deficiency anemia, unspecified: Secondary | ICD-10-CM | POA: Diagnosis not present

## 2023-11-29 DIAGNOSIS — N186 End stage renal disease: Secondary | ICD-10-CM | POA: Diagnosis not present

## 2023-11-29 DIAGNOSIS — Z992 Dependence on renal dialysis: Secondary | ICD-10-CM | POA: Diagnosis not present

## 2023-11-29 DIAGNOSIS — N2581 Secondary hyperparathyroidism of renal origin: Secondary | ICD-10-CM | POA: Diagnosis not present

## 2023-11-29 DIAGNOSIS — D689 Coagulation defect, unspecified: Secondary | ICD-10-CM | POA: Diagnosis not present

## 2023-11-30 DIAGNOSIS — E1165 Type 2 diabetes mellitus with hyperglycemia: Secondary | ICD-10-CM | POA: Diagnosis not present

## 2023-11-30 DIAGNOSIS — Z1331 Encounter for screening for depression: Secondary | ICD-10-CM | POA: Diagnosis not present

## 2023-12-06 DIAGNOSIS — D689 Coagulation defect, unspecified: Secondary | ICD-10-CM | POA: Diagnosis not present

## 2023-12-06 DIAGNOSIS — Z992 Dependence on renal dialysis: Secondary | ICD-10-CM | POA: Diagnosis not present

## 2023-12-06 DIAGNOSIS — D631 Anemia in chronic kidney disease: Secondary | ICD-10-CM | POA: Diagnosis not present

## 2023-12-06 DIAGNOSIS — D509 Iron deficiency anemia, unspecified: Secondary | ICD-10-CM | POA: Diagnosis not present

## 2023-12-06 DIAGNOSIS — N2581 Secondary hyperparathyroidism of renal origin: Secondary | ICD-10-CM | POA: Diagnosis not present

## 2023-12-06 DIAGNOSIS — N186 End stage renal disease: Secondary | ICD-10-CM | POA: Diagnosis not present

## 2023-12-13 DIAGNOSIS — N2581 Secondary hyperparathyroidism of renal origin: Secondary | ICD-10-CM | POA: Diagnosis not present

## 2023-12-13 DIAGNOSIS — D509 Iron deficiency anemia, unspecified: Secondary | ICD-10-CM | POA: Diagnosis not present

## 2023-12-13 DIAGNOSIS — N186 End stage renal disease: Secondary | ICD-10-CM | POA: Diagnosis not present

## 2023-12-13 DIAGNOSIS — Z992 Dependence on renal dialysis: Secondary | ICD-10-CM | POA: Diagnosis not present

## 2023-12-13 DIAGNOSIS — D689 Coagulation defect, unspecified: Secondary | ICD-10-CM | POA: Diagnosis not present

## 2023-12-21 DIAGNOSIS — N186 End stage renal disease: Secondary | ICD-10-CM | POA: Diagnosis not present

## 2023-12-21 DIAGNOSIS — Z992 Dependence on renal dialysis: Secondary | ICD-10-CM | POA: Diagnosis not present

## 2023-12-21 DIAGNOSIS — D689 Coagulation defect, unspecified: Secondary | ICD-10-CM | POA: Diagnosis not present

## 2023-12-21 DIAGNOSIS — N2581 Secondary hyperparathyroidism of renal origin: Secondary | ICD-10-CM | POA: Diagnosis not present

## 2023-12-23 DIAGNOSIS — E1122 Type 2 diabetes mellitus with diabetic chronic kidney disease: Secondary | ICD-10-CM | POA: Diagnosis not present

## 2023-12-23 DIAGNOSIS — Z992 Dependence on renal dialysis: Secondary | ICD-10-CM | POA: Diagnosis not present

## 2023-12-23 DIAGNOSIS — N186 End stage renal disease: Secondary | ICD-10-CM | POA: Diagnosis not present

## 2023-12-24 DIAGNOSIS — Z992 Dependence on renal dialysis: Secondary | ICD-10-CM | POA: Diagnosis not present

## 2023-12-24 DIAGNOSIS — D509 Iron deficiency anemia, unspecified: Secondary | ICD-10-CM | POA: Diagnosis not present

## 2023-12-24 DIAGNOSIS — N186 End stage renal disease: Secondary | ICD-10-CM | POA: Diagnosis not present

## 2023-12-24 DIAGNOSIS — N2581 Secondary hyperparathyroidism of renal origin: Secondary | ICD-10-CM | POA: Diagnosis not present

## 2023-12-27 DIAGNOSIS — Z992 Dependence on renal dialysis: Secondary | ICD-10-CM | POA: Diagnosis not present

## 2023-12-27 DIAGNOSIS — D509 Iron deficiency anemia, unspecified: Secondary | ICD-10-CM | POA: Diagnosis not present

## 2023-12-27 DIAGNOSIS — N186 End stage renal disease: Secondary | ICD-10-CM | POA: Diagnosis not present

## 2023-12-27 DIAGNOSIS — N2581 Secondary hyperparathyroidism of renal origin: Secondary | ICD-10-CM | POA: Diagnosis not present

## 2023-12-27 DIAGNOSIS — D689 Coagulation defect, unspecified: Secondary | ICD-10-CM | POA: Diagnosis not present

## 2023-12-31 DIAGNOSIS — N186 End stage renal disease: Secondary | ICD-10-CM | POA: Diagnosis not present

## 2023-12-31 DIAGNOSIS — Z992 Dependence on renal dialysis: Secondary | ICD-10-CM | POA: Diagnosis not present

## 2023-12-31 DIAGNOSIS — Z794 Long term (current) use of insulin: Secondary | ICD-10-CM | POA: Diagnosis not present

## 2023-12-31 DIAGNOSIS — E1142 Type 2 diabetes mellitus with diabetic polyneuropathy: Secondary | ICD-10-CM | POA: Diagnosis not present

## 2023-12-31 DIAGNOSIS — E1159 Type 2 diabetes mellitus with other circulatory complications: Secondary | ICD-10-CM | POA: Diagnosis not present

## 2023-12-31 DIAGNOSIS — E1122 Type 2 diabetes mellitus with diabetic chronic kidney disease: Secondary | ICD-10-CM | POA: Diagnosis not present

## 2023-12-31 DIAGNOSIS — E1165 Type 2 diabetes mellitus with hyperglycemia: Secondary | ICD-10-CM | POA: Diagnosis not present

## 2023-12-31 NOTE — Progress Notes (Signed)
 Chief complaint: Diabetes  History of present illness: Joseph Mcpherson is 78 y.o. male seen in follow up for type 2 diabetes.  Type 2 diabetes -- His Hb A1c was 9.3% on 11/30/2023. He has a Libre 14 day CGM which was downloaded and reviewed. A 14 day blood glucose average was 200 mg/dl. Time in target is 37%, time above target is 55% and time below target is 8%. Sensor usage is 68% of the time and he scans sugars on avg 2 times daily. Interpretation: he has persistent hyperglycemia from 1pm - 1am daily. Sugars in the early morning are generally in target. Lows occur around 6-7am.   For diabetes, he is taking Levemir  (none if blood glucose <200, 4 units if blood glucose 200-300, 6 units if blood glucose >300), NovoLog  before meals twice daily (none if be <200, 6 units if blood glucose 200-300, 8 units if blood glucose >300), and Januvia 25 mg daily.   Previously tried Ozempic in fall 2024. Wife reports it was stopped a few months ago by his Nephrologist, Dr Blanch, not clear why.   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 He takes gabapentin  for neuropathy pain. He apparently saw an eye doctor by himself a few months ago, she does not know where or when. She does not know if he has retinopathy.  Past Medical History:  Diagnosis Date  . Acute hyperkalemia 05/02/2022  . Atrial fibrillation (CMS/HHS-HCC) 05/04/2022  . Coronary artery disease   . CVA (cerebral vascular accident) (CMS/HHS-HCC) 04/2019  . Dementia (CMS/HHS-HCC) 03/22/2022  . Diabetic neuropathy associated with diabetes mellitus due to underlying condition (CMS/HHS-HCC) 03/16/2016  . Encounter for management of intra-aortic balloon pump 05/02/2022  . ESRD (end stage renal disease) on dialysis (CMS/HHS-HCC)   . Essential hypertension   . GERD (gastroesophageal reflux disease)   . Gout   . HFrEF (heart failure with reduced ejection  fraction) (CMS/HHS-HCC) 04/20/2022  . Hypothyroidism   . Lacunar stroke (CMS/HHS-HCC) 05/31/2019  . Mixed hyperlipidemia   . NSTEMI (non-ST elevated myocardial infarction) (CMS/HHS-HCC) 03/09/2022  . Status post ligation of left atrial appendage 05/04/2022  . Type 2 diabetes mellitus (CMS/HHS-HCC)     Outpatient Medications Marked as Taking for the 12/31/23 encounter (Office Visit) with Solum, Anna Melissa, MD  Medication Sig Dispense Refill  . acetaminophen  (TYLENOL ) 500 MG tablet Take by mouth    . amLODIPine  (NORVASC ) 10 MG tablet Take 1 tablet (10 mg total) by mouth once daily for 180 days 90 tablet 1  . aspirin  81 MG EC tablet Take 1 tablet (81 mg total) by mouth once daily 360 tablet 0  . atorvastatin  (LIPITOR) 80 MG tablet Take 1 tablet (80 mg total) by mouth once daily 90 tablet 1  . CALCIUM  ACETATE ORAL Take 667 mg by mouth 3 (three) times daily with meals    . carvediloL (COREG) 25 MG tablet Take 12.5 mg by mouth daily with dinner    . chlorproMAZINE  (THORAZINE ) 25 MG tablet Take 25 mg by mouth 3 (three) times daily as needed for hiccups?    . cholecalciferol , vitamin D3, (VITAMIN D3) 125 mcg (5,000 unit) tablet Take 5,000 Units by mouth once daily OTC    . clopidogreL  (PLAVIX ) 75 mg tablet TAKE 1 TABLET (75 MG TOTAL) BY MOUTH ONCE DAILY FOR 180 DAYS 90 tablet 0  . cyanocobalamin, vitamin B-12, (VITAMIN B-12 ORAL) Take 5,000 mcg by mouth once daily  OTC    . docusate (COLACE) 100 MG capsule TAKE 1 CAPSULE BY MOUTH TWICE A DAY 60 capsule 1  . donepeziL  (ARICEPT ) 10 MG tablet Take 1 tablet (10 mg total) by mouth once daily for 360 days 90 tablet 0  . gabapentin  (NEURONTIN ) 100 MG capsule Take 100 mg by mouth at bedtime    . insulin  ASPART (NOVOLOG  FLEXPEN) pen injector (concentration 100 units/mL) Inject 6 Units subcutaneously 3 (three) times daily with meals Plus additional from sliding scale as indicated. Maximum total daily dose 15 units 18 mL 3  . insulin  DETEMIR (LEVEMIR  FLEXTOUCH)  pen injector (concentration 100 units/mL) Inject 6 Units subcutaneously at bedtime as needed (if blood sugar is over 200)    . levocetirizine (XYZAL ) 5 MG tablet TAKE 1 TABLET BY MOUTH EVERY DAY IN THE EVENING 90 tablet 0  . losartan  (COZAAR ) 25 MG tablet Take 1 tablet (25 mg total) by mouth once daily for 180 days 90 tablet 1  . pantoprazole  (PROTONIX ) 40 MG DR tablet TAKE 1 TABLET (40 MG TOTAL) BY MOUTH ONCE DAILY FOR 180 DAYS 90 tablet 1  . SITagliptin phosphate (JANUVIA) 50 MG tablet Take 50 mg by mouth once daily      Exam: BP 122/70   Pulse 62   Ht 162.6 cm (5' 4)   Wt 61.2 kg (135 lb)   SpO2 100%   BMI 23.17 kg/m  GEN: well developed, well nourished, in NAD. SKIN: no dermatopathy or rash. A bilateral bare foot exam shows no calluses.  PSYC: alert, unable to assess insight  Labs  Lab Results  Component Value Date   WBC 10.0 12/21/2022   HGB 11.7 (L) 12/21/2022   HCT 35.9 (L) 12/21/2022   PLT 232 12/21/2022   TRIG 125 12/21/2022   HDL 41.6 12/21/2022   ALT 13 12/21/2022   AST 15 12/21/2022   NA 135 (L) 11/30/2023   K 5.3 (H) 11/30/2023   CL 94 (L) 11/30/2023   CREATININE 6.2 (H) 11/30/2023   BUN 22 11/30/2023   CO2 32.8 (H) 11/30/2023   TSH 0.134 (L) 12/21/2022   INR 0.9 07/20/2022   HGBA1C 9.3 (H) 11/30/2023    Assessment: 1. Poorly controlled type 2 diabetes mellitus (CMS/HHS-HCC)   2. DM type 2 with diabetic peripheral neuropathy (CMS/HHS-HCC)   3. Type 2 diabetes mellitus with vascular disease (CMS/HHS-HCC)   4. Type 2 diabetes mellitus with chronic kidney disease on chronic dialysis, with long-term current use of insulin  (CMS/HHS-HCC)     Plan: *Diabetes is uncontrolled. We reviewed blood glucose and Hb A1c targets.  *Stop Januvia.  *Start Ozempic 0.25 mg weekly. After 4 weeks, adjust to 0.5 mg weekly.  *Reduce Levemir  to 4 units nightly.  *Take NovoLog  insulin  before meals as follows: If sugar 79 or less --> no insulin  If sugar 80 - 150 -- take 2  units If sugar 151 - 200 -- take 4 units If sugar 201 - 250 -- take 6 units If sugar 251 or higher - take 8 units *Reiterated instructions for NovoLog : NovoLog  is only to be taken before meals Do not take NovoLog  after meals Only time you will not take NovoLog  before meal is if the sugar is 79 or less *Check sugars qACHS. Bring CGM to each follow up visit.  *Anticipate follow up in 4 weeks.

## 2024-01-03 DIAGNOSIS — N2581 Secondary hyperparathyroidism of renal origin: Secondary | ICD-10-CM | POA: Diagnosis not present

## 2024-01-03 DIAGNOSIS — N186 End stage renal disease: Secondary | ICD-10-CM | POA: Diagnosis not present

## 2024-01-03 DIAGNOSIS — D631 Anemia in chronic kidney disease: Secondary | ICD-10-CM | POA: Diagnosis not present

## 2024-01-03 DIAGNOSIS — D509 Iron deficiency anemia, unspecified: Secondary | ICD-10-CM | POA: Diagnosis not present

## 2024-01-03 DIAGNOSIS — Z992 Dependence on renal dialysis: Secondary | ICD-10-CM | POA: Diagnosis not present

## 2024-01-03 DIAGNOSIS — D689 Coagulation defect, unspecified: Secondary | ICD-10-CM | POA: Diagnosis not present

## 2024-01-10 DIAGNOSIS — Z992 Dependence on renal dialysis: Secondary | ICD-10-CM | POA: Diagnosis not present

## 2024-01-10 DIAGNOSIS — D689 Coagulation defect, unspecified: Secondary | ICD-10-CM | POA: Diagnosis not present

## 2024-01-10 DIAGNOSIS — D509 Iron deficiency anemia, unspecified: Secondary | ICD-10-CM | POA: Diagnosis not present

## 2024-01-10 DIAGNOSIS — N186 End stage renal disease: Secondary | ICD-10-CM | POA: Diagnosis not present

## 2024-01-10 DIAGNOSIS — N2581 Secondary hyperparathyroidism of renal origin: Secondary | ICD-10-CM | POA: Diagnosis not present

## 2024-01-17 DIAGNOSIS — N186 End stage renal disease: Secondary | ICD-10-CM | POA: Diagnosis not present

## 2024-01-17 DIAGNOSIS — D509 Iron deficiency anemia, unspecified: Secondary | ICD-10-CM | POA: Diagnosis not present

## 2024-01-17 DIAGNOSIS — N2581 Secondary hyperparathyroidism of renal origin: Secondary | ICD-10-CM | POA: Diagnosis not present

## 2024-01-17 DIAGNOSIS — Z992 Dependence on renal dialysis: Secondary | ICD-10-CM | POA: Diagnosis not present

## 2024-01-17 DIAGNOSIS — D689 Coagulation defect, unspecified: Secondary | ICD-10-CM | POA: Diagnosis not present

## 2024-01-23 DIAGNOSIS — N186 End stage renal disease: Secondary | ICD-10-CM | POA: Diagnosis not present

## 2024-01-23 DIAGNOSIS — E1122 Type 2 diabetes mellitus with diabetic chronic kidney disease: Secondary | ICD-10-CM | POA: Diagnosis not present

## 2024-01-23 DIAGNOSIS — Z992 Dependence on renal dialysis: Secondary | ICD-10-CM | POA: Diagnosis not present

## 2024-01-24 DIAGNOSIS — Z992 Dependence on renal dialysis: Secondary | ICD-10-CM | POA: Diagnosis not present

## 2024-01-24 DIAGNOSIS — N2581 Secondary hyperparathyroidism of renal origin: Secondary | ICD-10-CM | POA: Diagnosis not present

## 2024-01-24 DIAGNOSIS — N186 End stage renal disease: Secondary | ICD-10-CM | POA: Diagnosis not present

## 2024-01-24 DIAGNOSIS — D509 Iron deficiency anemia, unspecified: Secondary | ICD-10-CM | POA: Diagnosis not present

## 2024-01-24 DIAGNOSIS — D689 Coagulation defect, unspecified: Secondary | ICD-10-CM | POA: Diagnosis not present

## 2024-01-24 DIAGNOSIS — E1129 Type 2 diabetes mellitus with other diabetic kidney complication: Secondary | ICD-10-CM | POA: Diagnosis not present

## 2024-01-31 DIAGNOSIS — Z992 Dependence on renal dialysis: Secondary | ICD-10-CM | POA: Diagnosis not present

## 2024-01-31 DIAGNOSIS — D689 Coagulation defect, unspecified: Secondary | ICD-10-CM | POA: Diagnosis not present

## 2024-01-31 DIAGNOSIS — D509 Iron deficiency anemia, unspecified: Secondary | ICD-10-CM | POA: Diagnosis not present

## 2024-01-31 DIAGNOSIS — N186 End stage renal disease: Secondary | ICD-10-CM | POA: Diagnosis not present

## 2024-01-31 DIAGNOSIS — N2581 Secondary hyperparathyroidism of renal origin: Secondary | ICD-10-CM | POA: Diagnosis not present

## 2024-02-01 DIAGNOSIS — E1142 Type 2 diabetes mellitus with diabetic polyneuropathy: Secondary | ICD-10-CM | POA: Diagnosis not present

## 2024-02-01 DIAGNOSIS — E1165 Type 2 diabetes mellitus with hyperglycemia: Secondary | ICD-10-CM | POA: Diagnosis not present

## 2024-02-01 DIAGNOSIS — Z794 Long term (current) use of insulin: Secondary | ICD-10-CM | POA: Diagnosis not present

## 2024-02-01 DIAGNOSIS — E1159 Type 2 diabetes mellitus with other circulatory complications: Secondary | ICD-10-CM | POA: Diagnosis not present

## 2024-02-07 DIAGNOSIS — D689 Coagulation defect, unspecified: Secondary | ICD-10-CM | POA: Diagnosis not present

## 2024-02-07 DIAGNOSIS — N2581 Secondary hyperparathyroidism of renal origin: Secondary | ICD-10-CM | POA: Diagnosis not present

## 2024-02-07 DIAGNOSIS — Z992 Dependence on renal dialysis: Secondary | ICD-10-CM | POA: Diagnosis not present

## 2024-02-07 DIAGNOSIS — N186 End stage renal disease: Secondary | ICD-10-CM | POA: Diagnosis not present

## 2024-02-07 DIAGNOSIS — D509 Iron deficiency anemia, unspecified: Secondary | ICD-10-CM | POA: Diagnosis not present

## 2024-02-14 DIAGNOSIS — N186 End stage renal disease: Secondary | ICD-10-CM | POA: Diagnosis not present

## 2024-02-14 DIAGNOSIS — D689 Coagulation defect, unspecified: Secondary | ICD-10-CM | POA: Diagnosis not present

## 2024-02-14 DIAGNOSIS — N2581 Secondary hyperparathyroidism of renal origin: Secondary | ICD-10-CM | POA: Diagnosis not present

## 2024-02-14 DIAGNOSIS — Z992 Dependence on renal dialysis: Secondary | ICD-10-CM | POA: Diagnosis not present

## 2024-02-14 DIAGNOSIS — D509 Iron deficiency anemia, unspecified: Secondary | ICD-10-CM | POA: Diagnosis not present

## 2024-02-21 DIAGNOSIS — N186 End stage renal disease: Secondary | ICD-10-CM | POA: Diagnosis not present

## 2024-02-21 DIAGNOSIS — D689 Coagulation defect, unspecified: Secondary | ICD-10-CM | POA: Diagnosis not present

## 2024-02-21 DIAGNOSIS — N2581 Secondary hyperparathyroidism of renal origin: Secondary | ICD-10-CM | POA: Diagnosis not present

## 2024-02-21 DIAGNOSIS — Z992 Dependence on renal dialysis: Secondary | ICD-10-CM | POA: Diagnosis not present

## 2024-02-22 DIAGNOSIS — N186 End stage renal disease: Secondary | ICD-10-CM | POA: Diagnosis not present

## 2024-02-22 DIAGNOSIS — Z992 Dependence on renal dialysis: Secondary | ICD-10-CM | POA: Diagnosis not present

## 2024-02-22 DIAGNOSIS — E1122 Type 2 diabetes mellitus with diabetic chronic kidney disease: Secondary | ICD-10-CM | POA: Diagnosis not present

## 2024-02-23 DIAGNOSIS — N2581 Secondary hyperparathyroidism of renal origin: Secondary | ICD-10-CM | POA: Diagnosis not present

## 2024-02-23 DIAGNOSIS — Z992 Dependence on renal dialysis: Secondary | ICD-10-CM | POA: Diagnosis not present

## 2024-02-23 DIAGNOSIS — N186 End stage renal disease: Secondary | ICD-10-CM | POA: Diagnosis not present

## 2024-02-23 DIAGNOSIS — D509 Iron deficiency anemia, unspecified: Secondary | ICD-10-CM | POA: Diagnosis not present

## 2024-02-23 DIAGNOSIS — D689 Coagulation defect, unspecified: Secondary | ICD-10-CM | POA: Diagnosis not present

## 2024-02-28 DIAGNOSIS — Z992 Dependence on renal dialysis: Secondary | ICD-10-CM | POA: Diagnosis not present

## 2024-02-28 DIAGNOSIS — N186 End stage renal disease: Secondary | ICD-10-CM | POA: Diagnosis not present

## 2024-02-28 DIAGNOSIS — D689 Coagulation defect, unspecified: Secondary | ICD-10-CM | POA: Diagnosis not present

## 2024-02-28 DIAGNOSIS — N2581 Secondary hyperparathyroidism of renal origin: Secondary | ICD-10-CM | POA: Diagnosis not present

## 2024-02-28 DIAGNOSIS — D631 Anemia in chronic kidney disease: Secondary | ICD-10-CM | POA: Diagnosis not present

## 2024-02-28 DIAGNOSIS — D509 Iron deficiency anemia, unspecified: Secondary | ICD-10-CM | POA: Diagnosis not present

## 2024-03-06 DIAGNOSIS — N2581 Secondary hyperparathyroidism of renal origin: Secondary | ICD-10-CM | POA: Diagnosis not present

## 2024-03-06 DIAGNOSIS — Z992 Dependence on renal dialysis: Secondary | ICD-10-CM | POA: Diagnosis not present

## 2024-03-06 DIAGNOSIS — D689 Coagulation defect, unspecified: Secondary | ICD-10-CM | POA: Diagnosis not present

## 2024-03-06 DIAGNOSIS — D509 Iron deficiency anemia, unspecified: Secondary | ICD-10-CM | POA: Diagnosis not present

## 2024-03-07 DIAGNOSIS — Z794 Long term (current) use of insulin: Secondary | ICD-10-CM | POA: Diagnosis not present

## 2024-03-07 DIAGNOSIS — E1142 Type 2 diabetes mellitus with diabetic polyneuropathy: Secondary | ICD-10-CM | POA: Diagnosis not present

## 2024-03-07 DIAGNOSIS — N186 End stage renal disease: Secondary | ICD-10-CM | POA: Diagnosis not present

## 2024-03-07 DIAGNOSIS — E1165 Type 2 diabetes mellitus with hyperglycemia: Secondary | ICD-10-CM | POA: Diagnosis not present

## 2024-03-07 DIAGNOSIS — Z992 Dependence on renal dialysis: Secondary | ICD-10-CM | POA: Diagnosis not present

## 2024-03-07 DIAGNOSIS — E1159 Type 2 diabetes mellitus with other circulatory complications: Secondary | ICD-10-CM | POA: Diagnosis not present

## 2024-03-07 DIAGNOSIS — E1122 Type 2 diabetes mellitus with diabetic chronic kidney disease: Secondary | ICD-10-CM | POA: Diagnosis not present

## 2024-03-07 NOTE — Progress Notes (Signed)
 Chief complaint: Diabetes  History of present illness: Joseph Mcpherson is 78 y.o. male seen in follow up for type 2 diabetes. He was last seen on 02/01/2024. He returns with his wife. His daughter was on speaker phone. He does not answer questions. His wife and daughter answer all the questions.    Type 2 diabetes -- His Hb A1c was 9.3% on 11/30/2023. He has a Libre 14 day CGM which was downloaded and reviewed. A 14 day blood glucose average was 163 mg/dl. Time in target (target set at 100 - 178) is 50%, time above target is 39%, and time below target is 11%. Over the last 30 days, he had two events when sugars dropped into the 60s for a brief (<1 hour) period of time. No severe episodes of hypoglycemia requiring outside assistance. His sensor usage is 71% of the time and he scans sugars on avg 3 times daily. Interpretation: lowest blood glucose readings from 6am - 12pm and he has consistent hyperglycemia (<180 mg/dl) from 4pm - 87jf.  For diabetes, wife is now giving him Ozempic 0.25 mg weekly (every Tuesday), Levemir  4 units qhs, NovoLog  insulin  before meals: 2 units if blood glucose 80 - 150, then 2 units per 50 mg/dl over 848 mg/dl.  Wife reports he is more tired. Not clear if he has had nausea. No emesis. She is concerned he has had weight loss. Weight is down only 2 lbs from last visit. Ozempic was not started until last visit, one month ago.  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 He takes gabapentin  for neuropathy pain. He apparently saw an eye doctor by himself a few months ago, she does not know where or when. She does not know if he has retinopathy.  Past Medical History:  Diagnosis Date  . Acute hyperkalemia 05/02/2022  . Atrial fibrillation (CMS/HHS-HCC) 05/04/2022  . Coronary artery disease   . CVA (cerebral vascular accident) (CMS/HHS-HCC) 04/2019  . Dementia (CMS-HCC) 03/22/2022   . Diabetic neuropathy associated with diabetes mellitus due to underlying condition (CMS/HHS-HCC) 03/16/2016  . Encounter for management of intra-aortic balloon pump 05/02/2022  . ESRD (end stage renal disease) on dialysis (CMS/HHS-HCC)   . Essential hypertension   . GERD (gastroesophageal reflux disease)   . Gout   . HFrEF (heart failure with reduced ejection fraction) (CMS/HHS-HCC) 04/20/2022  . Hypothyroidism   . Lacunar stroke (CMS/HHS-HCC) 05/31/2019  . Mixed hyperlipidemia   . NSTEMI (non-ST elevated myocardial infarction) (CMS/HHS-HCC) 03/09/2022  . Status post ligation of left atrial appendage 05/04/2022  . Type 2 diabetes mellitus (CMS/HHS-HCC)     Outpatient Medications Marked as Taking for the 03/07/24 encounter (Office Visit) with Solum, Anna Melissa, MD  Medication Sig Dispense Refill        . albuterol  (PROVENTIL ) 2.5 mg /3 mL (0.083 %) nebulizer solution Take 3 mLs (2.5 mg total) by nebulization every 6 (six) hours as needed for Wheezing 75 mL 3  . amLODIPine  (NORVASC ) 10 MG tablet Take 1 tablet (10 mg total) by mouth once daily for 180 days 90 tablet 1  . aspirin  81 MG EC tablet Take 1 tablet (81 mg total) by mouth once daily 360 tablet 0  . atorvastatin  (LIPITOR) 80 MG tablet Take 1 tablet (80 mg total) by mouth once daily 90 tablet 1  . CALCIUM  ACETATE ORAL Take 667 mg by mouth 3 (three) times daily with meals    .  carvediloL (COREG) 25 MG tablet Take 12.5 mg by mouth 2 (two) times daily with meals    . chlorproMAZINE  (THORAZINE ) 25 MG tablet Take 25 mg by mouth 3 (three) times daily as needed for hiccups?    . cholecalciferol , vitamin D3, (VITAMIN D3) 125 mcg (5,000 unit) tablet Take 5,000 Units by mouth once daily OTC    . clopidogreL  (PLAVIX ) 75 mg tablet Take 1 tablet (75 mg total) by mouth once daily 30 tablet 0  . cyanocobalamin, vitamin B-12, (VITAMIN B-12 ORAL) Take 5,000 mcg by mouth once daily OTC    . donepeziL  (ARICEPT ) 10 MG tablet Take 1 tablet (10 mg  total) by mouth once daily 30 tablet 0  . gabapentin  (NEURONTIN ) 100 MG capsule Take 100 mg by mouth at bedtime    . insulin  ASPART (NOVOLOG  FLEXPEN) pen injector (concentration 100 units/mL) Inject 6 Units subcutaneously 3 (three) times daily with meals Plus additional from sliding scale as indicated. Maximum total daily dose 15 units 18 mL 3  . insulin  DETEMIR (LEVEMIR  FLEXTOUCH) pen injector (concentration 100 units/mL) Inject 4 Units subcutaneously at bedtime    . levocetirizine (XYZAL ) 5 MG tablet TAKE 1 TABLET BY MOUTH EVERY DAY IN THE EVENING 90 tablet 0  . levothyroxine  (SYNTHROID ) 137 MCG tablet Take 1 tablet (137 mcg total) by mouth once daily for 180 days 90 tablet 1  . losartan  (COZAAR ) 25 MG tablet Take 1 tablet (25 mg total) by mouth once daily for 180 days 90 tablet 1  . methoxy peg-epoetin  beta (MIRCERA INJ) Inject 1 Needle free injection into the muscle every 14 (fourteen) days    . pantoprazole  (PROTONIX ) 40 MG DR tablet TAKE 1 TABLET (40 MG TOTAL) BY MOUTH ONCE DAILY FOR 180 DAYS 90 tablet 1  . semaglutide (OZEMPIC) 0.25 mg or 0.5 mg(2 mg/1.5 mL) pen injector Inject 0.25 mg weekly.   1.5 mL 0   Exam: BP 110/70   Pulse 58   Ht 162.6 cm (5' 4)   Wt 61.2 kg (135 lb)   SpO2 100%   BMI 23.17 kg/m  GEN: well developed, well nourished, in NAD. SKIN: no dermatopathy or rash. A bilateral bare foot exam shows no calluses.  PSYC: alert, unable to assess insight.    Assessment: 1. Poorly controlled type 2 diabetes mellitus (CMS/HHS-HCC)   2. DM type 2 with diabetic peripheral neuropathy (CMS/HHS-HCC)   3. Type 2 diabetes mellitus with vascular disease (CMS/HHS-HCC)   4. Type 2 diabetes mellitus with chronic kidney disease on chronic dialysis, with long-term current use of insulin  (CMS/HHS-HCC)   5. Long-term insulin  use (CMS/HHS-HCC)     Plan: *Diabetes remains uncontrolled and he has persistent post-prandial hyperglycemia.  *Discontinue Levemir .  *Continue NovoLog  insulin   before meals as follows: If sugar 79 or less --> no insulin  If sugar 80 - 150 -- take 2 units If sugar 151 - 200 -- take 4 units If sugar 201 - 250 -- take 6 units If sugar 251 or higher - take 8 units  *Adjust the dose of the Ozempic to 0.5 mg weekly. Counseled pt, wife, and dtr that his perceived weight loss is not due to Ozempic which was started just about 4 weeks ago. He seems to be tolerating ozempic well.  *Check sugars qACHS. Bring CGM to each follow up visit.  *Anticipate follow up in 8 weeks.

## 2024-03-13 DIAGNOSIS — N2581 Secondary hyperparathyroidism of renal origin: Secondary | ICD-10-CM | POA: Diagnosis not present

## 2024-03-13 DIAGNOSIS — D631 Anemia in chronic kidney disease: Secondary | ICD-10-CM | POA: Diagnosis not present

## 2024-03-13 DIAGNOSIS — D509 Iron deficiency anemia, unspecified: Secondary | ICD-10-CM | POA: Diagnosis not present

## 2024-03-13 DIAGNOSIS — D689 Coagulation defect, unspecified: Secondary | ICD-10-CM | POA: Diagnosis not present

## 2024-03-13 DIAGNOSIS — Z992 Dependence on renal dialysis: Secondary | ICD-10-CM | POA: Diagnosis not present

## 2024-03-17 ENCOUNTER — Other Ambulatory Visit: Payer: Self-pay

## 2024-03-17 ENCOUNTER — Emergency Department: Admission: EM | Admit: 2024-03-17 | Discharge: 2024-03-17 | Disposition: A | Discharge status: Home / Self Care

## 2024-03-17 ENCOUNTER — Emergency Department

## 2024-03-17 ENCOUNTER — Encounter: Payer: Self-pay | Admitting: Emergency Medicine

## 2024-03-17 DIAGNOSIS — R7989 Other specified abnormal findings of blood chemistry: Secondary | ICD-10-CM | POA: Diagnosis not present

## 2024-03-17 DIAGNOSIS — R112 Nausea with vomiting, unspecified: Secondary | ICD-10-CM | POA: Diagnosis not present

## 2024-03-17 DIAGNOSIS — I2510 Atherosclerotic heart disease of native coronary artery without angina pectoris: Secondary | ICD-10-CM | POA: Insufficient documentation

## 2024-03-17 DIAGNOSIS — R110 Nausea: Secondary | ICD-10-CM | POA: Diagnosis not present

## 2024-03-17 DIAGNOSIS — E1122 Type 2 diabetes mellitus with diabetic chronic kidney disease: Secondary | ICD-10-CM | POA: Diagnosis not present

## 2024-03-17 DIAGNOSIS — Z992 Dependence on renal dialysis: Secondary | ICD-10-CM | POA: Diagnosis not present

## 2024-03-17 DIAGNOSIS — I132 Hypertensive heart and chronic kidney disease with heart failure and with stage 5 chronic kidney disease, or end stage renal disease: Secondary | ICD-10-CM | POA: Insufficient documentation

## 2024-03-17 DIAGNOSIS — D72829 Elevated white blood cell count, unspecified: Secondary | ICD-10-CM | POA: Diagnosis not present

## 2024-03-17 DIAGNOSIS — E1165 Type 2 diabetes mellitus with hyperglycemia: Secondary | ICD-10-CM | POA: Insufficient documentation

## 2024-03-17 DIAGNOSIS — R066 Hiccough: Secondary | ICD-10-CM | POA: Diagnosis not present

## 2024-03-17 DIAGNOSIS — I509 Heart failure, unspecified: Secondary | ICD-10-CM | POA: Insufficient documentation

## 2024-03-17 DIAGNOSIS — N186 End stage renal disease: Secondary | ICD-10-CM | POA: Diagnosis not present

## 2024-03-17 DIAGNOSIS — I10 Essential (primary) hypertension: Secondary | ICD-10-CM | POA: Diagnosis not present

## 2024-03-17 DIAGNOSIS — D649 Anemia, unspecified: Secondary | ICD-10-CM | POA: Diagnosis not present

## 2024-03-17 LAB — TROPONIN I (HIGH SENSITIVITY)
Troponin I (High Sensitivity): 29 ng/L — ABNORMAL HIGH (ref <18)
Troponin I (High Sensitivity): 28 ng/L — ABNORMAL HIGH (ref <18)

## 2024-03-17 LAB — CBC
HCT: 29.5 % — ABNORMAL LOW (ref 39.0–52.0)
Hemoglobin: 9.6 g/dL — ABNORMAL LOW (ref 13.0–17.0)
MCH: 30.4 pg (ref 26.0–34.0)
MCHC: 32.5 g/dL (ref 30.0–36.0)
MCV: 93.4 fL (ref 80.0–100.0)
Platelets: 258 K/uL (ref 150–400)
RBC: 3.16 MIL/uL — ABNORMAL LOW (ref 4.22–5.81)
RDW: 13.7 % (ref 11.5–15.5)
WBC: 14.1 K/uL — ABNORMAL HIGH (ref 4.0–10.5)
nRBC: 0 % (ref 0.0–0.2)

## 2024-03-17 LAB — BASIC METABOLIC PANEL WITH GFR
Anion gap: 12 (ref 5–15)
BUN: 19 mg/dL (ref 8–23)
CO2: 26 mmol/L (ref 22–32)
Calcium: 8.8 mg/dL — ABNORMAL LOW (ref 8.9–10.3)
Chloride: 94 mmol/L — ABNORMAL LOW (ref 98–111)
Creatinine, Ser: 3.61 mg/dL — ABNORMAL HIGH (ref 0.61–1.24)
GFR, Estimated: 17 mL/min — ABNORMAL LOW (ref >60)
Glucose, Bld: 233 mg/dL — ABNORMAL HIGH (ref 70–99)
Potassium: 4 mmol/L (ref 3.5–5.1)
Sodium: 132 mmol/L — ABNORMAL LOW (ref 135–145)

## 2024-03-17 LAB — HEPATIC FUNCTION PANEL
ALT: 17 U/L (ref 0–44)
AST: 18 U/L (ref 15–41)
Albumin: 3.1 g/dL — ABNORMAL LOW (ref 3.5–5.0)
Alkaline Phosphatase: 257 U/L — ABNORMAL HIGH (ref 38–126)
Bilirubin, Direct: <0.1 mg/dL (ref 0.0–0.2)
Total Bilirubin: 0.4 mg/dL (ref 0.0–1.2)
Total Protein: 7.4 g/dL (ref 6.5–8.1)

## 2024-03-17 MED ORDER — PANTOPRAZOLE SODIUM 40 MG IV SOLR
40.0000 mg | Freq: Once | INTRAVENOUS | Status: AC
  Administered 2024-03-17: 40 mg via INTRAVENOUS
  Filled 2024-03-17: qty 10

## 2024-03-17 MED ORDER — POLYETHYLENE GLYCOL 3350 17 G PO PACK: 17.0000 g | PACK | Freq: Every day | ORAL | 0 refills | Status: AC

## 2024-03-17 MED ORDER — DIPHENHYDRAMINE HCL 50 MG/ML IJ SOLN
12.5000 mg | Freq: Once | INTRAMUSCULAR | Status: AC
Start: 2024-03-17 — End: 2024-03-17
  Administered 2024-03-17: 12.5 mg via INTRAVENOUS
  Filled 2024-03-17: qty 1

## 2024-03-17 MED ORDER — ONDANSETRON HCL 4 MG/2ML IJ SOLN
4.0000 mg | Freq: Once | INTRAMUSCULAR | Status: AC
  Administered 2024-03-17: 4 mg via INTRAVENOUS
  Filled 2024-03-17: qty 2

## 2024-03-17 MED ORDER — ONDANSETRON 4 MG PO TBDP: 4.0000 mg | ORAL_TABLET | Freq: Four times a day (QID) | ORAL | 0 refills | Status: AC | PRN

## 2024-03-17 MED ORDER — METOCLOPRAMIDE HCL 5 MG/ML IJ SOLN
10.0000 mg | Freq: Once | INTRAMUSCULAR | Status: AC
Start: 2024-03-17 — End: 2024-03-17
  Administered 2024-03-17: 10 mg via INTRAVENOUS
  Filled 2024-03-17: qty 2

## 2024-03-17 NOTE — ED Notes (Signed)
 Occasional persistent hiccups noted.

## 2024-03-17 NOTE — ED Notes (Signed)
 Hiccups are persisting. Dr. Nicholaus aware.

## 2024-03-17 NOTE — ED Notes (Signed)
 Patient given ice water and saltines per Dr. Nicholaus as a PO challenge. Wife is at bedside caring for patient. Patient was able to reposition self on the stretcher.

## 2024-03-17 NOTE — Discharge Instructions (Signed)
 You were seen in the emergency department for intractable hiccups and nausea and vomiting in the setting of recently increasing your Ozempic dose.  Workup today in our emergency department was reassuring.  Your case was discussed with your endocrinologist Dr. Damian and she recommends using Zofran  as needed and decreasing next time you are due to 0.25 mg of Ozempic.  In the meantime it can take several days for your symptoms to resolve.  Fluids are more important than solids and it might behoove you to purchase Pedialyte or just stick to very bland foods.  Zofran  can be constipating so I have initiated MiraLAX  along with this.  Please call your primary care physician on Monday.  Continue your regular dialysis schedule.  Return with any acutely worsening symptoms or any other emergency. -- RETURN PRECAUTIONS & AFTERCARE: (ENGLISH) RETURN PRECAUTIONS: Return immediately to the emergency department or see/call your doctor if you feel worse, weak or have changes in speech or vision, are short of breath, have fever, vomiting, pain, bleeding or dark stool, trouble urinating or any new issues. Return here or see/call your doctor if not improving as expected for your suspected condition. FOLLOW-UP CARE: Call your doctor and/or any doctors we referred you to for more advice and to make an appointment. Do this today, tomorrow or after the weekend. Some doctors only take PPO insurance so if you have HMO insurance you may want to contact your HMO or your regular doctor for referral to a specialist within your plan. Either way tell the doctor's office that it was a referral from the emergency department so you get the soonest possible appointment.  YOUR TEST RESULTS: Take result reports of any blood or urine tests, imaging tests and EKG's to your doctor and any referral doctor. Have any abnormal tests repeated. Your doctor or a referral doctor can let you know when this should be done. Also make sure your doctor contacts  this hospital to get any test results that are not currently available such as cultures or special tests for infection and final imaging reports, which are often not available at the time you leave the ER but which may list additional important findings that are not documented on the preliminary report. BLOOD PRESSURE: If your blood pressure was greater than 120/80 have your blood pressure rechecked within 1 to 2 weeks. MEDICATION SIDE EFFECTS: Do not drive, walk, bike, take the bus, etc. if you have received or are being prescribed any sedating medications such as those for pain or anxiety or certain antihistamines like Benadryl . If you have been give one of these here get a taxi home or have a friend drive you home. Ask your pharmacist to counsel you on potential side effects of any new medication

## 2024-03-17 NOTE — ED Triage Notes (Signed)
 BIBEMS, coming from home for hiccups x3 days. Pt is MWF, had dialysis today but stopped earlier d/t hiccups uncomfortably. BGL: 224. 170/66. VSS. GCS 15. Denies SOB, CP, dizziness.

## 2024-03-17 NOTE — ED Provider Notes (Signed)
 [facility] Provider Note    Event Date/Time   First MD Initiated Contact with Patient 03/17/24 1051     (approximate)   History   Hiccups  History and physical was obtained using the telemetry [place] interpreter  HPI  Joseph Mcpherson is a 78 y.o. male with past medical history of end-stage renal disease on Monday Wednesday Friday dialysis, type 2 diabetes, CHF, hypertension, CVA, seizure disorder, CAD and chronic anemia who presents from dialysis today with 2 to 3 days of worsening nausea, decreased p.o. and hiccups.  Patient this month has been prescribed Ozempic and on Wednesday of this week just prior to when symptoms started, the dose of Ozempic was increased from 0.25 mg to 0.5 mg.  Since then he has had multiple episodes of hiccups nausea and decreased p.o.  There have been no syncopal episodes and patient denies any headache hearing or vision changes, chest pain shortness of breath abdominal pain or changes in bowel habits.  He did go to dialysis today and did receive a course of dialysis but given his continued symptoms he was advised to come to the emergency department.  His wife presents with him and contributes to the history      Physical Exam   Triage Vital Signs: ED Triage Vitals [03/17/24 1042]  Encounter Vitals Group     BP 133/66     Girls Systolic BP Percentile      Girls Diastolic BP Percentile      Boys Systolic BP Percentile      Boys Diastolic BP Percentile      Pulse Rate 99     Resp 16     Temp 98.4 F (36.9 C)     Temp Source Oral     SpO2 99 %     Weight      Height      Head Circumference      Peak Flow      Pain Score 0     Pain Loc      Pain Education      Exclude from Growth Chart     Most recent vital signs: Vitals:   03/17/24 1300 03/17/24 1315  BP: (!) 150/103   Pulse: 62 63  Resp: 19 19  Temp:    SpO2: 100% 100%    Nursing Triage Note reviewed. Vital signs reviewed and patients oxygen saturation is  normoxic  General: Patient is well nourished, well developed, awake and alert, resting comfortably in no acute distress Head: Normocephalic and atraumatic Eyes: Normal inspection, extraocular muscles intact, no conjunctival pallor Ear, nose, throat: Normal external exam Neck: Normal range of motion, handling secretions, no stridor Respiratory: Patient is in no respiratory distress, lungs CTAB Patient has a dialysis catheter in right chest clean dry and intact Cardiovascular: Patient is not tachycardic, RR GI: Abd SNT with no guarding or rebound  Back: Normal inspection of the back with good strength and range of motion throughout all ext Extremities: pulses intact with good cap refills, no LE pitting edema or calf tenderness Neuro: The patient is alert and oriented to person, place, and time, appropriately conversive, with 5/5 bilat UE/LE strength, no gross motor or sensory defects noted. Skin: Warm, dry, and intact Psych: normal mood and affect, no SI or HI  ED Results / Procedures / Treatments   Labs (all labs ordered are listed, but only abnormal results are displayed) Labs Reviewed  BASIC METABOLIC PANEL WITH GFR - Abnormal; Notable for  the following components:      Result Value   Sodium 132 (*)    Chloride 94 (*)    Glucose, Bld 233 (*)    Creatinine, Ser 3.61 (*)    Calcium  8.8 (*)    GFR, Estimated 17 (*)    All other components within normal limits  CBC - Abnormal; Notable for the following components:   WBC 14.1 (*)    RBC 3.16 (*)    Hemoglobin 9.6 (*)    HCT 29.5 (*)    All other components within normal limits  HEPATIC FUNCTION PANEL - Abnormal; Notable for the following components:   Albumin 3.1 (*)    Alkaline Phosphatase 257 (*)    All other components within normal limits  TROPONIN I (HIGH SENSITIVITY) - Abnormal; Notable for the following components:   Troponin I (High Sensitivity) 29 (*)    All other components within normal limits  TROPONIN I (HIGH  SENSITIVITY) - Abnormal; Notable for the following components:   Troponin I (High Sensitivity) 28 (*)    All other components within normal limits     EKG EKG and rhythm strip are interpreted by myself:   EKG: [Normal sinus rhythm] at heart rate of 57, normal QRS duration, QTc 453, nonspecific ST segments and T waves, PAVs EKG not consistent with Acute STEMI Rhythm strip: NSR in lead II  EKG and rhythm strip are interpreted by myself:   EKG: [Normal sinus rhythm] at heart rate of 67, normal QRS duration, QTc 475, nonspecific ST segments and T waves no ectopy EKG not consistent with Acute STEMI Rhythm strip: NSR in lead II   RADIOLOGY Xray chest: No acute abnormality on my independent review interpretation radiologist is in agreement    PROCEDURES:  Critical Care performed: No  Procedures   MEDICATIONS ORDERED IN ED: Medications  diphenhydrAMINE  (BENADRYL ) injection 12.5 mg (12.5 mg Intravenous Given 03/17/24 1203)  metoCLOPramide (REGLAN) injection 10 mg (10 mg Intravenous Given 03/17/24 1207)  pantoprazole  (PROTONIX ) injection 40 mg (40 mg Intravenous Given 03/17/24 1212)  ondansetron  (ZOFRAN ) injection 4 mg (4 mg Intravenous Given 03/17/24 1301)     IMPRESSION / MDM / ASSESSMENT AND PLAN / ED COURSE                                Differential diagnosis includes, but is not limited to, medication reaction, electrolyte derangement, ACS, anemia   ED course: Patient is well-appearing and EKG demonstrates no evidence of acute ischemia.  Given the timing of symptoms with the initial increase of Ozempic most concerning for medication reaction to this medication.  Patient had no profound electrolyte derangements that would require emergent dialysis today.  He did have a slight leukocytosis however this appears to be baseline.  His troponin was slightly elevated x 2 but much improved from previous.  Patient was given Benadryl  Reglan Protonix  and Zofran  with improvement in  symptoms and was able to tolerate p.o.  His case was discussed with Dr. Damian who recommends decreasing the Ozempic back to the 0.25 mg dose and patient and wife were made aware.  I have sent a prescription of Zofran  to the patient's pharmacy of choice.  All questions answered and patient wife voiced understanding and requested discharge   Clinical Course as of 03/17/24 1657  Fri Mar 17, 2024  1128 CBC(!) White count and hematocrit seem to follow at baseline for the patient [HD]  1132 Called Patient's endocrinologist Dr. Therisa Setter [facility]; talked to her.  If.  She is not in the office today.  They are unsure whether there was an on-call provider.  I left a message detailing the situation including my cell phone.  They will try to reach out to any available staff but they are unsure whether they are able to touch base today [HD]  1213 Troponin I (High Sensitivity)(!): 29 Better than previous [HD]  1213 DG Chest 2 View No acute abnormality on my independent review interpretation [HD]  1304 Basic metabolic panel(!) Potassium not elevated today, does not need emergent dialysis [HD]  1306 Glucose(!): 233 Elevated today but no anion gap [HD]  1309 Case discussed with Dr. Damian, and she is not surprised that patient is experiencing the symptoms.  Patient has not had any profound significant weight loss.  Symptoms occurred when they recently increased the dose from 0.25-0.5 milligrams and she recommends going back down to 0.25 mg.  From her standpoint if workup is unremarkable patient can be discharged on this along with a course of Zofran .  She will continue to follow-up with the patient and will be available if any problems arise [HD]  1341 Troponin I (High Sensitivity)(!): 28 Stable from previous [HD]    Clinical Course User Index [HD] Nicholaus Rolland BRAVO, MD   At time of discharge there is no evidence of acute life, limb, vision, or fertility threat. Patient has stable vital signs, pain is well  controlled  and p.o. tolerant.  Discharge instructions were completed using the EPIC system. I would refer you to those at this time. All warnings prescriptions follow-up etc. were discussed in detail with the patient. Patient indicates understanding and is agreeable with this plan. All questions answered.  Patient is made aware that they may return to the emergency department for any worsening or new condition or for any other emergency.   -- Risk: 5 This patient has a high risk of morbidity due to further diagnostic testing or treatment. Rationale: This patient's evaluation and management involve a high risk of morbidity due to the potential severity of presenting symptoms, need for diagnostic testing, and/or initiation of treatment that may require close monitoring. The differential includes conditions with potential for significant deterioration or requiring escalation of care. Treatment decisions in the ED, including medication administration, procedural interventions, or disposition planning, reflect this level of risk. COPA: 5 The patient has the following acute or chronic illness/injury that poses a possible threat to life or bodily function: [X] : The patient has a potentially serious acute condition or an acute exacerbation of a chronic illness requiring urgent evaluation and management in the Emergency Department. The clinical presentation necessitates immediate consideration of life-threatening or function-threatening diagnoses, even if they are ultimately ruled out.   FINAL CLINICAL IMPRESSION(S) / ED DIAGNOSES   Final diagnoses:  Nausea  Hiccups     Rx / DC Orders   ED Discharge Orders          Ordered    ondansetron  (ZOFRAN -ODT) 4 MG disintegrating tablet  Every 6 hours PRN        03/17/24 1323    polyethylene glycol (MIRALAX ) 17 g packet  Daily        03/17/24 1323             Note:  This document was prepared using Dragon voice recognition software and may  include unintentional dictation errors.   Nicholaus Rolland BRAVO, MD 03/17/24 [phone]

## 2024-03-20 DIAGNOSIS — D689 Coagulation defect, unspecified: Secondary | ICD-10-CM | POA: Diagnosis not present

## 2024-03-20 DIAGNOSIS — Z992 Dependence on renal dialysis: Secondary | ICD-10-CM | POA: Diagnosis not present

## 2024-03-20 DIAGNOSIS — N2581 Secondary hyperparathyroidism of renal origin: Secondary | ICD-10-CM | POA: Diagnosis not present

## 2024-03-21 DIAGNOSIS — E0842 Diabetes mellitus due to underlying condition with diabetic polyneuropathy: Secondary | ICD-10-CM | POA: Diagnosis not present

## 2024-03-21 DIAGNOSIS — Z794 Long term (current) use of insulin: Secondary | ICD-10-CM | POA: Diagnosis not present

## 2024-03-21 DIAGNOSIS — Z992 Dependence on renal dialysis: Secondary | ICD-10-CM | POA: Diagnosis not present

## 2024-03-21 DIAGNOSIS — N186 End stage renal disease: Secondary | ICD-10-CM | POA: Diagnosis not present

## 2024-03-21 DIAGNOSIS — R066 Hiccough: Secondary | ICD-10-CM | POA: Diagnosis not present

## 2024-03-21 DIAGNOSIS — R4189 Other symptoms and signs involving cognitive functions and awareness: Secondary | ICD-10-CM | POA: Diagnosis not present

## 2024-03-21 DIAGNOSIS — E1142 Type 2 diabetes mellitus with diabetic polyneuropathy: Secondary | ICD-10-CM | POA: Diagnosis not present

## 2024-03-21 DIAGNOSIS — E1159 Type 2 diabetes mellitus with other circulatory complications: Secondary | ICD-10-CM | POA: Diagnosis not present

## 2024-03-21 DIAGNOSIS — E1122 Type 2 diabetes mellitus with diabetic chronic kidney disease: Secondary | ICD-10-CM | POA: Diagnosis not present

## 2024-03-21 DIAGNOSIS — E1165 Type 2 diabetes mellitus with hyperglycemia: Secondary | ICD-10-CM | POA: Diagnosis not present

## 2024-03-23 ENCOUNTER — Emergency Department

## 2024-03-23 ENCOUNTER — Observation Stay
Admission: EM | Admit: 2024-03-23 | Discharge: 2024-03-25 | Disposition: A | Discharge status: Home Health | Attending: Family Medicine | Admitting: Family Medicine

## 2024-03-23 ENCOUNTER — Other Ambulatory Visit: Payer: Self-pay

## 2024-03-23 DIAGNOSIS — Z7982 Long term (current) use of aspirin: Secondary | ICD-10-CM | POA: Diagnosis not present

## 2024-03-23 DIAGNOSIS — G301 Alzheimer's disease with late onset: Secondary | ICD-10-CM | POA: Diagnosis present

## 2024-03-23 DIAGNOSIS — M6281 Muscle weakness (generalized): Secondary | ICD-10-CM | POA: Diagnosis not present

## 2024-03-23 DIAGNOSIS — I5022 Chronic systolic (congestive) heart failure: Secondary | ICD-10-CM | POA: Diagnosis not present

## 2024-03-23 DIAGNOSIS — E039 Hypothyroidism, unspecified: Secondary | ICD-10-CM | POA: Diagnosis present

## 2024-03-23 DIAGNOSIS — Z992 Dependence on renal dialysis: Secondary | ICD-10-CM | POA: Diagnosis not present

## 2024-03-23 DIAGNOSIS — E1122 Type 2 diabetes mellitus with diabetic chronic kidney disease: Secondary | ICD-10-CM | POA: Diagnosis not present

## 2024-03-23 DIAGNOSIS — I10 Essential (primary) hypertension: Secondary | ICD-10-CM | POA: Diagnosis present

## 2024-03-23 DIAGNOSIS — E782 Mixed hyperlipidemia: Secondary | ICD-10-CM | POA: Diagnosis not present

## 2024-03-23 DIAGNOSIS — R2681 Unsteadiness on feet: Secondary | ICD-10-CM | POA: Insufficient documentation

## 2024-03-23 DIAGNOSIS — Z79899 Other long term (current) drug therapy: Secondary | ICD-10-CM | POA: Diagnosis not present

## 2024-03-23 DIAGNOSIS — R4182 Altered mental status, unspecified: Secondary | ICD-10-CM | POA: Diagnosis not present

## 2024-03-23 DIAGNOSIS — I2510 Atherosclerotic heart disease of native coronary artery without angina pectoris: Secondary | ICD-10-CM | POA: Diagnosis not present

## 2024-03-23 DIAGNOSIS — N186 End stage renal disease: Secondary | ICD-10-CM | POA: Diagnosis present

## 2024-03-23 DIAGNOSIS — Z8673 Personal history of transient ischemic attack (TIA), and cerebral infarction without residual deficits: Secondary | ICD-10-CM | POA: Diagnosis not present

## 2024-03-23 DIAGNOSIS — I132 Hypertensive heart and chronic kidney disease with heart failure and with stage 5 chronic kidney disease, or end stage renal disease: Secondary | ICD-10-CM | POA: Insufficient documentation

## 2024-03-23 DIAGNOSIS — D631 Anemia in chronic kidney disease: Secondary | ICD-10-CM | POA: Diagnosis not present

## 2024-03-23 DIAGNOSIS — G9341 Metabolic encephalopathy: Secondary | ICD-10-CM | POA: Diagnosis not present

## 2024-03-23 DIAGNOSIS — D72829 Elevated white blood cell count, unspecified: Secondary | ICD-10-CM | POA: Diagnosis present

## 2024-03-23 DIAGNOSIS — I672 Cerebral atherosclerosis: Secondary | ICD-10-CM | POA: Diagnosis not present

## 2024-03-23 DIAGNOSIS — E785 Hyperlipidemia, unspecified: Secondary | ICD-10-CM | POA: Insufficient documentation

## 2024-03-23 DIAGNOSIS — Z794 Long term (current) use of insulin: Secondary | ICD-10-CM | POA: Diagnosis not present

## 2024-03-23 DIAGNOSIS — Z959 Presence of cardiac and vascular implant and graft, unspecified: Secondary | ICD-10-CM | POA: Insufficient documentation

## 2024-03-23 DIAGNOSIS — R29818 Other symptoms and signs involving the nervous system: Secondary | ICD-10-CM | POA: Diagnosis not present

## 2024-03-23 DIAGNOSIS — E1129 Type 2 diabetes mellitus with other diabetic kidney complication: Secondary | ICD-10-CM | POA: Diagnosis present

## 2024-03-23 DIAGNOSIS — T50905A Adverse effect of unspecified drugs, medicaments and biological substances, initial encounter: Secondary | ICD-10-CM

## 2024-03-23 DIAGNOSIS — R410 Disorientation, unspecified: Secondary | ICD-10-CM | POA: Diagnosis not present

## 2024-03-23 LAB — CBC WITH DIFFERENTIAL/PLATELET
Abs Immature Granulocytes: 0.1 K/uL — ABNORMAL HIGH (ref 0.00–0.07)
Basophils Absolute: 0.1 K/uL (ref 0.0–0.1)
Basophils Relative: 1 %
Eosinophils Absolute: 0.1 K/uL (ref 0.0–0.5)
Eosinophils Relative: 1 %
HCT: 35.2 % — ABNORMAL LOW (ref 39.0–52.0)
Hemoglobin: 11.1 g/dL — ABNORMAL LOW (ref 13.0–17.0)
Immature Granulocytes: 1 %
Lymphocytes Relative: 12 %
Lymphs Abs: 1.5 K/uL (ref 0.7–4.0)
MCH: 29.8 pg (ref 26.0–34.0)
MCHC: 31.5 g/dL (ref 30.0–36.0)
MCV: 94.4 fL (ref 80.0–100.0)
Monocytes Absolute: 0.7 K/uL (ref 0.1–1.0)
Monocytes Relative: 6 %
Neutro Abs: 9.7 K/uL — ABNORMAL HIGH (ref 1.7–7.7)
Neutrophils Relative %: 79 %
Platelets: 462 K/uL — ABNORMAL HIGH (ref 150–400)
RBC: 3.73 MIL/uL — ABNORMAL LOW (ref 4.22–5.81)
RDW: 14.6 % (ref 11.5–15.5)
WBC: 12.2 K/uL — ABNORMAL HIGH (ref 4.0–10.5)
nRBC: 0 % (ref 0.0–0.2)

## 2024-03-23 LAB — COMPREHENSIVE METABOLIC PANEL WITH GFR
ALT: 17 U/L (ref 0–44)
AST: 22 U/L (ref 15–41)
Albumin: 3.7 g/dL (ref 3.5–5.0)
Alkaline Phosphatase: 343 U/L — ABNORMAL HIGH (ref 38–126)
Anion gap: 15 (ref 5–15)
BUN: 17 mg/dL (ref 8–23)
CO2: 26 mmol/L (ref 22–32)
Calcium: 9.4 mg/dL (ref 8.9–10.3)
Chloride: 94 mmol/L — ABNORMAL LOW (ref 98–111)
Creatinine, Ser: 6.31 mg/dL — ABNORMAL HIGH (ref 0.61–1.24)
GFR, Estimated: 8 mL/min — ABNORMAL LOW (ref >60)
Glucose, Bld: 269 mg/dL — ABNORMAL HIGH (ref 70–99)
Potassium: 4.5 mmol/L (ref 3.5–5.1)
Sodium: 135 mmol/L (ref 135–145)
Total Bilirubin: 1 mg/dL (ref 0.0–1.2)
Total Protein: 8.1 g/dL (ref 6.5–8.1)

## 2024-03-23 LAB — CBG MONITORING, ED: Glucose-Capillary: 229 mg/dL — ABNORMAL HIGH (ref 70–99)

## 2024-03-23 LAB — ETHANOL: Alcohol, Ethyl (B): <15 mg/dL (ref <15)

## 2024-03-23 LAB — TROPONIN I (HIGH SENSITIVITY)
Troponin I (High Sensitivity): 40 ng/L — ABNORMAL HIGH (ref <18)
Troponin I (High Sensitivity): 38 ng/L — ABNORMAL HIGH (ref <18)

## 2024-03-23 MED ORDER — HEPARIN SODIUM (PORCINE) 5000 UNIT/ML IJ SOLN
5000.0000 [IU] | Freq: Three times a day (TID) | INTRAMUSCULAR | Status: DC
  Administered 2024-03-24 – 2024-03-25 (×5): 5000 [IU] via SUBCUTANEOUS
  Filled 2024-03-23 (×5): qty 1

## 2024-03-23 MED ORDER — INSULIN ASPART 100 UNIT/ML IJ SOLN
0.0000 [IU] | Freq: Every day | INTRAMUSCULAR | Status: DC
  Administered 2024-03-23: 2 [IU] via SUBCUTANEOUS
  Filled 2024-03-23: qty 2

## 2024-03-23 MED ORDER — HYDRALAZINE HCL 20 MG/ML IJ SOLN
10.0000 mg | INTRAMUSCULAR | Status: DC | PRN
  Administered 2024-03-23: 10 mg via INTRAVENOUS
  Filled 2024-03-23: qty 1

## 2024-03-23 MED ORDER — DM-GUAIFENESIN ER 30-600 MG PO TB12: 1.0000 | ORAL_TABLET | Freq: Two times a day (BID) | ORAL | Status: DC | PRN

## 2024-03-23 MED ORDER — ACETAMINOPHEN 325 MG PO TABS: 650.0000 mg | ORAL_TABLET | Freq: Four times a day (QID) | ORAL | Status: DC | PRN

## 2024-03-23 MED ORDER — INSULIN ASPART 100 UNIT/ML IJ SOLN
0.0000 [IU] | Freq: Three times a day (TID) | INTRAMUSCULAR | Status: DC
  Administered 2024-03-24: 2 [IU] via SUBCUTANEOUS
  Administered 2024-03-24: 1 [IU] via SUBCUTANEOUS
  Filled 2024-03-23 (×3): qty 1

## 2024-03-23 MED ORDER — ALBUTEROL SULFATE (2.5 MG/3ML) 0.083% IN NEBU: 2.5000 mg | INHALATION_SOLUTION | RESPIRATORY_TRACT | Status: DC | PRN

## 2024-03-23 MED ORDER — ONDANSETRON HCL 4 MG/2ML IJ SOLN: 4.0000 mg | Freq: Three times a day (TID) | INTRAMUSCULAR | Status: DC | PRN

## 2024-03-23 MED ORDER — HYDRALAZINE HCL 20 MG/ML IJ SOLN: 15.0000 mg | INTRAMUSCULAR | Status: DC | PRN

## 2024-03-23 NOTE — ED Notes (Signed)
 Attempted to ambulate patient, patient lethargic and unresponsive. Patient dozing in and out of sleep, and unable to follow directions. Shows no sign of understanding or ability. No acute needs at this time.

## 2024-03-23 NOTE — ED Triage Notes (Signed)
 BIBEMS, pt coming from home, went to bed around 11pm yesterday and woke up around 1300-1400 this afternoon where wife states he was not acting normal. Started baclofen yesterday for hiccups. Last dialysis yesterday. Pt also has a strong urine smell.  BP: 226/88 HR: 66 CBG: 286 O2: 100%

## 2024-03-23 NOTE — H&P (Signed)
 History and Physical    Joseph Mcpherson FMW:[id number] DOB: 09/11/1945 DOA: 03/23/2024  Referring MD/NP/PA:   PCP: Sadie Manna, MD   Patient coming from:  The patient is coming from home.     Chief Complaint: AMS  HPI: Joseph Mcpherson is a 78 y.o. male with medical history significant of ESRD-HD (MWF), HTN, HLD, DM, PCD, CAD, s/p of CABG, sCHF with EF 40-455, stroke, hypothyroidism, dementia, anemia, PE not on anticoagulants, hard of hearing, who presents with altered mental status.  Per family (son, wife and the daughter-in-law at the bedside), at her normal baseline, patient is alert orientated x 3.  Patient has been confused since last night. Pt went to bed around 11pm yesterday and woke up around 1300-1400 this afternoon. When I saw pt in ED, he is confused, knows his own name, not oriented to the place and time.  He moves all extremities, with generalized weakness.  No facial droop or slurred speech.  Per family, due to hiccups, patient was started on baclofen by his neurologist yesterday.  Patient took 2 dose of baclofen yesterday.  His hiccups has resolved, but  pt becomes confused.  No chest pain, cough, SOB.  No nausea, vomiting, diarrhea or abdominal pain.  No dysuria.  No fever or chills.  Patient had dialysis yesterday.  Patient has tunneled dialysis catheter in place.  Data reviewed independently and ED Course: pt was found to have WBC 12.2, potassium 4.5, troponin 40 --> 38.  Temperature normal, blood pressure 204/76, 191/74, heart rate 50-60s, RR 18, oxygen saturation 100% on room air.  CT of head negative for acute intracranial abnormalities.  Patient is placed in telemetry bed for observation.  Dr. Dennise of nephrology is consulted.   EKG: I have personally reviewed.  Sinus rhythm, QTc 429, LVH, T wave inversion in inferior leads and V4-V6.   Review of Systems: Cannot be reviewed due to altered mental status.   Allergy: No Known Allergies  Past Medical History:  Diagnosis  Date   Clotting disorder    per daughter, when patient travels, his feet swell   Diabetes mellitus without complication (HCC)    type II   Diabetic eye exam (HCC) 12/2019   ESRD (end stage renal disease) (HCC)    MWF- [place]   GERD (gastroesophageal reflux disease)    [name] (hard of hearing)    Hyperlipidemia    Hypertension    Hypothyroidism    Thyroid  disease     Past Surgical History:  Procedure Laterality Date   AV FISTULA PLACEMENT Right 10/22/2017   Procedure: ARTERIOVENOUS (AV) BRACHIOCEPHALIC FISTULA CREATION RIGHT UPPER ARM;  Surgeon: Laurence Redell CROME, MD;  Location: [facility] OR;  Service: Vascular;  Laterality: Right;   AV FISTULA PLACEMENT Left 02/03/2019   Procedure: INSERTION OF ARTERIOVENOUS (AV) GORE-TEX GRAFT ARM (BRACHIAL AXILLARY );  Surgeon: Jama Cordella MATSU, MD;  Location: [facility] ORS;  Service: Vascular;  Laterality: Left;   AV FISTULA PLACEMENT Left 12/10/2020   Procedure: LEFT ARM BRACHIOCEPHALIC ARTERIOVENOUS (AV) FISTULA CREATION;  Surgeon: Harvey Carlin BRAVO, MD;  Location: [facility] OR;  Service: Vascular;  Laterality: Left;   DIALYSIS/PERMA CATHETER INSERTION N/A 05/13/2023   Procedure: DIALYSIS/PERMA CATHETER INSERTION;  Surgeon: Melia Lynwood ORN, MD;  Location: [facility] INVASIVE CV LAB;  Service: Cardiovascular;  Laterality: N/A;   DIALYSIS/PERMA CATHETER REMOVAL N/A 05/13/2023   Procedure: DIALYSIS/PERMA CATHETER REMOVAL;  Surgeon: Melia Lynwood ORN, MD;  Location: [facility] INVASIVE CV LAB;  Service: Cardiovascular;  Laterality: N/A;   IR CHOLANGIOGRAM  EXISTING TUBE  03/18/2022   IR PERC CHOLECYSTOSTOMY  01/21/2022   LEFT HEART CATH AND CORONARY ANGIOGRAPHY N/A 04/22/2022   Procedure: LEFT HEART CATH AND CORONARY ANGIOGRAPHY;  Surgeon: Ammon Blunt, MD;  Location: [facility] INVASIVE CV LAB;  Service: Cardiovascular;  Laterality: N/A;   PERIPHERAL VASCULAR BALLOON ANGIOPLASTY Right 05/13/2023   Procedure: PERIPHERAL VASCULAR BALLOON ANGIOPLASTY;  Surgeon: Melia Lynwood ORN, MD;  Location: [facility]  INVASIVE CV LAB;  Service: Cardiovascular;  Laterality: Right;   UPPER EXTREMITY ANGIOGRAPHY Left 09/19/2019   Procedure: UPPER EXTREMITY ANGIOGRAPHY;  Surgeon: Jama Cordella MATSU, MD;  Location: [facility] INVASIVE CV LAB;  Service: Cardiovascular;  Laterality: Left;    Social History:  reports that he has never smoked. He has quit using smokeless tobacco. He reports that he does not drink alcohol and does not use drugs.  Family History:  Family History  Problem Relation Age of Onset   Diabetes Mother    Hyperlipidemia Mother    Hypertension Father      Prior to Admission medications   Medication Sig Start Date End Date Taking? Authorizing Provider  albuterol  (PROVENTIL ) (2.5 MG/3ML) 0.083% nebulizer solution Take 3 mLs (2.5 mg total) by nebulization every 6 (six) hours as needed for wheezing or shortness of breath. 03/13/22   Caleen Qualia, MD  albuterol  (VENTOLIN  HFA) 108 (90 Base) MCG/ACT inhaler Inhale 2 puffs into the lungs every 4 (four) hours as needed for wheezing or shortness of breath. 08/06/22   [provider]  amLODipine  (NORVASC ) 10 MG tablet Take 1 tablet (10 mg total) by mouth daily. 02/02/22   Methot, Sona, MD  aspirin  EC 81 MG tablet Take 81 mg by mouth daily. Swallow whole.    [provider]  atorvastatin  (LIPITOR) 80 MG tablet Take 80 mg by mouth every evening.    [provider]  B Complex-C-Folic Acid  (RENA-VITE RX) 1 MG TABS Take 1 tablet by mouth daily.     [provider]  BD PEN NEEDLE NANO 2ND GEN 32G X 4 MM MISC USE AS DIRECTED 02/26/22   Britta King, MD  calcium  acetate (PHOSLO ) 667 MG capsule TAKE 2 CAPSULES BY MOUTH 3 TIMES A DAY WITH MEAL Patient taking differently: Take 1,334 mg by mouth 2 (two) times daily with a meal. 02/16/22   Britta King, MD  carvedilol (COREG) 25 MG tablet Take 12.5 mg by mouth 2 (two) times daily with a meal.    [provider]  Cholecalciferol  (VITAMIN D3) 5000 units TABS Take 5,000 Units by  mouth daily.     [provider]  clopidogrel  (PLAVIX ) 75 MG tablet Take 1 tablet (75 mg total) by mouth daily. 03/14/22   Caleen Qualia, MD  Continuous Blood Gluc Sensor (FREESTYLE LIBRE 14 DAY SENSOR) MISC APPLY EVERY 14 (FOURTEEN) DAYS. 02/02/22   Masoud, Javed, MD  cyanocobalamin (VITAMIN B12) 1000 MCG tablet Take 1,000 mcg by mouth daily.    [provider]  donepezil  (ARICEPT ) 10 MG tablet TAKE 1 TABLET BY MOUTH EVERY DAY 04/22/21   Britta King, MD  gabapentin  (NEURONTIN ) 100 MG capsule Take 100 mg by mouth at bedtime.    [provider]  insulin  detemir (LEVEMIR ) 100 UNIT/ML injection Inject 0.15 mLs (15 Units total) into the skin at bedtime. Patient taking differently: Inject 4 Units into the skin at bedtime as needed (high blood sugar). 02/02/22   Raabe, Sona, MD  ipratropium-albuterol  (DUONEB) 0.5-2.5 (3) MG/3ML SOLN Take 3 mLs by nebulization every 6 (six) hours as  needed. 03/05/22   Masoud, Javed, MD  levocetirizine (XYZAL ) 5 MG tablet Take 5 mg by mouth every evening.    [provider]  levothyroxine  (SYNTHROID ) 137 MCG tablet TAKE 1 TABLET BY MOUTH EVERY DAY 03/24/21   Britta King, MD  losartan  (COZAAR ) 25 MG tablet Take 25 mg by mouth daily.    [provider]  ondansetron  (ZOFRAN -ODT) 4 MG disintegrating tablet Take 1 tablet (4 mg total) by mouth every 6 (six) hours as needed for nausea or vomiting. 03/17/24   Nicholaus Rolland BRAVO, MD  pantoprazole  (PROTONIX ) 40 MG tablet TAKE 1 TABLET BY MOUTH EVERY DAY 07/09/21   Masoud, Javed, MD  polyethylene glycol (MIRALAX ) 17 g packet Take 17 g by mouth daily. 03/17/24   Nicholaus Rolland BRAVO, MD    Physical Exam: Vitals:   03/23/24 1711 03/23/24 2030 03/23/24 2130 03/23/24 2350  BP: (!) 179/96 (!) 204/76 (!) 191/74   Pulse:  (!) 59 (!) 57   Resp:  18 15   Temp:    97.8 F (36.6 C)  TempSrc:    Oral  SpO2:  100% 100%   Weight:      Height:       General: Not in acute distress HEENT:        Eyes: PERRL, EOMI, no jaundice       ENT: No discharge from the ears and nose, no pharynx injection, no tonsillar enlargement.        Neck: No JVD, no bruit, no mass felt. Heme: No neck lymph node enlargement. Cardiac: S1/S2, RRR, No murmurs, No gallops or rubs. Respiratory: No rales, wheezing, rhonchi or rubs. GI: Soft, nondistended, nontender, no rebound pain, no organomegaly, BS present. GU: No hematuria Ext: No pitting leg edema bilaterally. 1+DP/PT pulse bilaterally. Musculoskeletal: No joint deformities, No joint redness or warmth, no limitation of ROM in spin. Skin: No rashes.  Neuro: Confused, knows he is own name, not oriented to place and time, cranial nerves II-XII grossly intact, moves all extremities  Psych: Patient is not psychotic  Labs on Admission: I have personally reviewed following labs and imaging studies  CBC: Recent Labs  Lab 03/17/24 1045 03/23/24 1717  WBC 14.1* 12.2*  NEUTROABS  --  9.7*  HGB 9.6* 11.1*  HCT 29.5* 35.2*  MCV 93.4 94.4  PLT 258 462*   Basic Metabolic Panel: Recent Labs  Lab 03/17/24 1045 03/23/24 1717  NA 132* 135  K 4.0 4.5  CL 94* 94*  CO2 26 26  GLUCOSE 233* 269*  BUN 19 17  CREATININE 3.61* 6.31*  CALCIUM  8.8* 9.4   GFR: Estimated Creatinine Clearance: 8.1 mL/min (A) (by C-G formula based on SCr of 6.31 mg/dL (H)). Liver Function Tests: Recent Labs  Lab 03/17/24 1045 03/23/24 1717  AST 18 22  ALT 17 17  ALKPHOS 257* 343*  BILITOT 0.4 1.0  PROT 7.4 8.1  ALBUMIN 3.1* 3.7   No results for input(s): LIPASE, AMYLASE in the last 168 hours. No results for input(s): AMMONIA in the last 168 hours. Coagulation Profile: No results for input(s): INR, PROTIME in the last 168 hours. Cardiac Enzymes: No results for input(s): CKTOTAL, CKMB, CKMBINDEX, TROPONINI in the last 168 hours. BNP (last 3 results) No results for input(s): PROBNP in the last 8760 hours. HbA1C: No results for input(s): HGBA1C in  the last 72 hours. CBG: Recent Labs  Lab 03/23/24 2257  GLUCAP 229*   Lipid Profile: No results for input(s): CHOL, HDL, LDLCALC, TRIG, CHOLHDL,  LDLDIRECT in the last 72 hours. Thyroid  Function Tests: No results for input(s): TSH, T4TOTAL, FREET4, T3FREE, THYROIDAB in the last 72 hours. Anemia Panel: No results for input(s): VITAMINB12, FOLATE, FERRITIN, TIBC, IRON, RETICCTPCT in the last 72 hours. Urine analysis:    Component Value Date/Time   COLORURINE Straw 09/11/2013 1933   APPEARANCEUR Clear 09/11/2013 1933   LABSPEC 1.016 09/11/2013 1933   PHURINE 7.0 09/11/2013 1933   GLUCOSEU >=500 09/11/2013 1933   HGBUR Negative 09/11/2013 1933   BILIRUBINUR Negative 09/11/2013 1933   KETONESUR Negative 09/11/2013 1933   PROTEINUR 100 mg/dL 95/79/7984 8066   NITRITE Negative 09/11/2013 1933   LEUKOCYTESUR Negative 09/11/2013 1933   Sepsis Labs: @LABRCNTIP (procalcitonin:4,lacticidven:4) )No results found for this or any previous visit (from the past 240 hours).   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)   Hypothyroidism   Mixed hyperlipidemia   Assessment and Plan:  Principal Problem:   Acute metabolic encephalopathy Active Problems:   Leukocytosis   End stage renal disease (HCC)   CAD (coronary artery disease)   Chronic systolic CHF (congestive heart failure) (HCC)   Essential hypertension   History of CVA (cerebrovascular accident)   Anemia in ESRD (end-stage renal disease) (HCC)   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     Disposition Plan:  Anticipate discharge back to previous environment  Consults called: Dr. Dennise of nephrology  Admission status and Level of care: Telemetry:    for obs      Dispo: The patient is from: Home              Anticipated d/c is to: Home              Anticipated d/c date is: 1 day              Patient currently is not medically stable to d/c.    Severity of Illness:  The appropriate patient status for this patient is OBSERVATION. Observation status is judged to be reasonable and necessary in order to provide the required intensity of service to ensure the patient's safety. The patient's presenting symptoms, physical exam findings, and initial radiographic and laboratory data in the context of their medical condition is felt to place them at decreased risk for further clinical deterioration. Furthermore, it is anticipated that the patient will be medically stable for discharge from the hospital within 2 midnights of admission.        Date of Service 03/23/2024    Caleb Exon [facility]   If 7PM-7AM, please contact night-coverage [email or web address] 03/23/2024, 11:55 PM

## 2024-03-23 NOTE — ED Provider Notes (Signed)
 [facility] Provider Note    Event Date/Time   First MD Initiated Contact with Patient 03/23/24 1654     (approximate)   History   Altered Mental Status   HPI  Joseph Mcpherson is a 78 y.o. male with ESRD received dialysis yesterday comes in with altered mental status.  Patient went to bed last night at 11 PM was reportedly normal.  This morning he was not acting his normal self more sleepy.  He did recently start taking baclofen and has had 2 doses to help with hiccups.  They did report giving him 1 dose in the afternoon yesterday and then a second dose right before going to bed.  They report that now he seems to be acting closer to his baseline self but still seems a little bit confused.  He is never been on baclofen before.  They report that it was just more difficult to wake him up and then he had an episode of urinating and defecating on himself which is abnormal for him.  Patient did receive dialysis yesterday.  Physical Exam   Triage Vital Signs: ED Triage Vitals  Encounter Vitals Group     BP      Girls Systolic BP Percentile      Girls Diastolic BP Percentile      Boys Systolic BP Percentile      Boys Diastolic BP Percentile      Pulse      Resp      Temp      Temp src      SpO2      Weight      Height      Head Circumference      Peak Flow      Pain Score      Pain Loc      Pain Education      Exclude from Growth Chart     Most recent vital signs: Vitals:   03/23/24 1711 03/23/24 2030  BP: (!) 179/96 (!) 204/76  Pulse:  (!) 59  Resp:  18  Temp:    SpO2:  100%     General: Awake, no distress.  CV:  Good peripheral perfusion.  Resp:  Normal effort.  Abd:  No distention.  Soft and nontender Other:  Dialysis catheter in right chest wall. Patient does appear confused although he is talking.  Seems to be spontaneously moving all extremities but not really following commands.   ED Results / Procedures / Treatments   Labs (all  labs ordered are listed, but only abnormal results are displayed) Labs Reviewed  CBC WITH DIFFERENTIAL/PLATELET - Abnormal; Notable for the following components:      Result Value   WBC 12.2 (*)    RBC 3.73 (*)    Hemoglobin 11.1 (*)    HCT 35.2 (*)    Platelets 462 (*)    Neutro Abs 9.7 (*)    Abs Immature Granulocytes 0.10 (*)    All other components within normal limits  COMPREHENSIVE METABOLIC PANEL WITH GFR - Abnormal; Notable for the following components:   Chloride 94 (*)    Glucose, Bld 269 (*)    Creatinine, Ser 6.31 (*)    Alkaline Phosphatase 343 (*)    GFR, Estimated 8 (*)    All other components within normal limits  TROPONIN I (HIGH SENSITIVITY) - Abnormal; Notable for the following components:   Troponin I (High Sensitivity) 40 (*)    All other  components within normal limits  TROPONIN I (HIGH SENSITIVITY) - Abnormal; Notable for the following components:   Troponin I (High Sensitivity) 38 (*)    All other components within normal limits  ETHANOL  URINALYSIS, ROUTINE W REFLEX MICROSCOPIC     EKG  My interpretation of EKG:  Normal sinus rate of 70 without any ST elevation or T wave inversions, normal intervals  RADIOLOGY I have reviewed the ct personally and interpreted no evidence of intracranial hemorrhage   PROCEDURES:  Critical Care performed: No  Procedures   MEDICATIONS ORDERED IN ED: Medications - No data to display   IMPRESSION / MDM / ASSESSMENT AND PLAN / ED COURSE  I reviewed the triage vital signs and the nursing notes.   Patient's presentation is most consistent with acute presentation with potential threat to life or bodily function.   Patient comes in with altered mental status increasing fatigue I suspect related to baclofen given ESRD, patient's age.  Last known well was yesterday at the window for stroke code I seems more like a global weakness then focal weakness.  Will get CT head to ensure no evidence of intercranial  hemorrhage and check blood work given patient is a dialysis patient.   IMPRESSION: 1. No acute intracranial abnormality. 2. Stable atrophy and chronic small vessel ischemia. Remote right occipital infarct.   Troponins are slightly elevated but similar to prior and flat upon repeat.  CBC shows reassuring blood work.  CMP shows slightly elevated glucose but no evidence of DKA and no evidence of needing emergent dialysis.  BUN is reassuring and potassium is 4.5   Baclofen is excreted by the kidneys so I do suspect that patient had increased accumulation secondary to the baclofen.  I recommended discontinuing baclofen.    Patient is still altered unable to ambulate therefore will discuss with hospital team for admission due to acute new altered mental status most likely secondary to medications.  The patient is on the cardiac monitor to evaluate for evidence of arrhythmia and/or significant heart rate changes.      FINAL CLINICAL IMPRESSION(S) / ED DIAGNOSES   Final diagnoses:  Altered mental status, unspecified altered mental status type  Adverse effect of drug, initial encounter     Rx / DC Orders   ED Discharge Orders     None        Note:  This document was prepared using Dragon voice recognition software and may include unintentional dictation errors.   Ernest Ronal BRAVO, MD 03/23/24 2200

## 2024-03-23 NOTE — ED Notes (Signed)
 This RN and Government social research officer provided pericare after pt had a episode of stool incontinence. Pt tolerated well. Brief applied and pt repositioned. NAD, Warm blankets provided. Wife at bedside.

## 2024-03-24 DIAGNOSIS — E1129 Type 2 diabetes mellitus with other diabetic kidney complication: Secondary | ICD-10-CM | POA: Diagnosis present

## 2024-03-24 DIAGNOSIS — Z992 Dependence on renal dialysis: Secondary | ICD-10-CM

## 2024-03-24 DIAGNOSIS — E039 Hypothyroidism, unspecified: Secondary | ICD-10-CM | POA: Diagnosis not present

## 2024-03-24 DIAGNOSIS — I2510 Atherosclerotic heart disease of native coronary artery without angina pectoris: Secondary | ICD-10-CM

## 2024-03-24 DIAGNOSIS — I5022 Chronic systolic (congestive) heart failure: Secondary | ICD-10-CM | POA: Diagnosis not present

## 2024-03-24 DIAGNOSIS — G9341 Metabolic encephalopathy: Secondary | ICD-10-CM | POA: Diagnosis not present

## 2024-03-24 DIAGNOSIS — I2583 Coronary atherosclerosis due to lipid rich plaque: Secondary | ICD-10-CM

## 2024-03-24 DIAGNOSIS — D72829 Elevated white blood cell count, unspecified: Secondary | ICD-10-CM | POA: Diagnosis not present

## 2024-03-24 DIAGNOSIS — D631 Anemia in chronic kidney disease: Secondary | ICD-10-CM

## 2024-03-24 DIAGNOSIS — N186 End stage renal disease: Secondary | ICD-10-CM | POA: Diagnosis not present

## 2024-03-24 DIAGNOSIS — E782 Mixed hyperlipidemia: Secondary | ICD-10-CM | POA: Diagnosis not present

## 2024-03-24 DIAGNOSIS — G301 Alzheimer's disease with late onset: Secondary | ICD-10-CM | POA: Diagnosis not present

## 2024-03-24 DIAGNOSIS — Z794 Long term (current) use of insulin: Secondary | ICD-10-CM

## 2024-03-24 DIAGNOSIS — E1122 Type 2 diabetes mellitus with diabetic chronic kidney disease: Secondary | ICD-10-CM

## 2024-03-24 DIAGNOSIS — Z8673 Personal history of transient ischemic attack (TIA), and cerebral infarction without residual deficits: Secondary | ICD-10-CM | POA: Diagnosis not present

## 2024-03-24 LAB — CBC
HCT: 29.3 % — ABNORMAL LOW (ref 39.0–52.0)
Hemoglobin: 9.7 g/dL — ABNORMAL LOW (ref 13.0–17.0)
MCH: 30.7 pg (ref 26.0–34.0)
MCHC: 33.1 g/dL (ref 30.0–36.0)
MCV: 92.7 fL (ref 80.0–100.0)
Platelets: 413 K/uL — ABNORMAL HIGH (ref 150–400)
RBC: 3.16 MIL/uL — ABNORMAL LOW (ref 4.22–5.81)
RDW: 14.7 % (ref 11.5–15.5)
WBC: 11.3 K/uL — ABNORMAL HIGH (ref 4.0–10.5)
nRBC: 0 % (ref 0.0–0.2)

## 2024-03-24 LAB — BASIC METABOLIC PANEL WITH GFR
Anion gap: 13 (ref 5–15)
BUN: 20 mg/dL (ref 8–23)
CO2: 28 mmol/L (ref 22–32)
Calcium: 8.6 mg/dL — ABNORMAL LOW (ref 8.9–10.3)
Chloride: 97 mmol/L — ABNORMAL LOW (ref 98–111)
Creatinine, Ser: 7.14 mg/dL — ABNORMAL HIGH (ref 0.61–1.24)
GFR, Estimated: 7 mL/min — ABNORMAL LOW (ref >60)
Glucose, Bld: 157 mg/dL — ABNORMAL HIGH (ref 70–99)
Potassium: 4 mmol/L (ref 3.5–5.1)
Sodium: 138 mmol/L (ref 135–145)

## 2024-03-24 LAB — CBG MONITORING, ED: Glucose-Capillary: 163 mg/dL — ABNORMAL HIGH (ref 70–99)

## 2024-03-24 LAB — GLUCOSE, CAPILLARY
Glucose-Capillary: 150 mg/dL — ABNORMAL HIGH (ref 70–99)
Glucose-Capillary: 181 mg/dL — ABNORMAL HIGH (ref 70–99)

## 2024-03-24 LAB — TSH: TSH: <0.1 u[IU]/mL — ABNORMAL LOW (ref 0.350–4.500)

## 2024-03-24 MED ORDER — HEPARIN SODIUM (PORCINE) 1000 UNIT/ML IJ SOLN
INTRAMUSCULAR | Status: AC
  Filled 2024-03-24: qty 5

## 2024-03-24 MED ORDER — LEVOTHYROXINE SODIUM 137 MCG PO TABS
137.0000 ug | ORAL_TABLET | Freq: Every day | ORAL | Status: DC
  Administered 2024-03-24 – 2024-03-25 (×2): 137 ug via ORAL
  Filled 2024-03-24 (×2): qty 1

## 2024-03-24 MED ORDER — CALCIUM ACETATE (PHOS BINDER) 667 MG PO CAPS
667.0000 mg | ORAL_CAPSULE | Freq: Every day | ORAL | Status: DC
  Administered 2024-03-24 – 2024-03-25 (×2): 667 mg via ORAL
  Filled 2024-03-24 (×3): qty 1

## 2024-03-24 MED ORDER — LOSARTAN POTASSIUM 25 MG PO TABS
25.0000 mg | ORAL_TABLET | Freq: Every day | ORAL | Status: DC
  Administered 2024-03-24 – 2024-03-25 (×3): 25 mg via ORAL
  Filled 2024-03-24 (×4): qty 1

## 2024-03-24 MED ORDER — VITAMIN D3 25 MCG (1000 UNIT) PO TABS
5000.0000 [IU] | ORAL_TABLET | Freq: Every day | ORAL | Status: DC
  Administered 2024-03-24 – 2024-03-25 (×2): 5000 [IU] via ORAL
  Filled 2024-03-24 (×5): qty 5

## 2024-03-24 MED ORDER — INSULIN GLARGINE-YFGN 100 UNIT/ML ~~LOC~~ SOLN
3.0000 [IU] | Freq: Every day | SUBCUTANEOUS | Status: DC
Start: 2024-03-24 — End: 2024-03-25
  Administered 2024-03-24 (×2): 3 [IU] via SUBCUTANEOUS
  Filled 2024-03-24 (×3): qty 0.03

## 2024-03-24 MED ORDER — CETIRIZINE HCL 10 MG PO TABS
10.0000 mg | ORAL_TABLET | Freq: Every evening | ORAL | Status: DC
  Administered 2024-03-24 – 2024-03-25 (×2): 10 mg via ORAL
  Filled 2024-03-24 (×2): qty 1

## 2024-03-24 MED ORDER — CHLORHEXIDINE GLUCONATE CLOTH 2 % EX PADS
6.0000 | MEDICATED_PAD | Freq: Every day | CUTANEOUS | Status: DC
  Administered 2024-03-24 – 2024-03-25 (×2): 6 via TOPICAL
  Filled 2024-03-24: qty 6

## 2024-03-24 MED ORDER — DONEPEZIL HCL 5 MG PO TABS
10.0000 mg | ORAL_TABLET | Freq: Every day | ORAL | Status: DC
  Administered 2024-03-24 – 2024-03-25 (×2): 10 mg via ORAL
  Filled 2024-03-24 (×3): qty 2

## 2024-03-24 MED ORDER — PANTOPRAZOLE SODIUM 40 MG PO TBEC
40.0000 mg | DELAYED_RELEASE_TABLET | Freq: Every day | ORAL | Status: DC
  Administered 2024-03-24 – 2024-03-25 (×2): 40 mg via ORAL
  Filled 2024-03-24 (×3): qty 1

## 2024-03-24 MED ORDER — CARVEDILOL 6.25 MG PO TABS
12.5000 mg | ORAL_TABLET | Freq: Two times a day (BID) | ORAL | Status: DC
  Administered 2024-03-24 – 2024-03-25 (×3): 12.5 mg via ORAL
  Filled 2024-03-24 (×4): qty 2

## 2024-03-24 MED ORDER — ASPIRIN 81 MG PO TBEC
81.0000 mg | DELAYED_RELEASE_TABLET | Freq: Every day | ORAL | Status: DC
  Administered 2024-03-24 – 2024-03-25 (×2): 81 mg via ORAL
  Filled 2024-03-24 (×3): qty 1

## 2024-03-24 MED ORDER — CLOPIDOGREL BISULFATE 75 MG PO TABS
75.0000 mg | ORAL_TABLET | Freq: Every day | ORAL | Status: DC
  Administered 2024-03-24 – 2024-03-25 (×2): 75 mg via ORAL
  Filled 2024-03-24 (×3): qty 1

## 2024-03-24 MED ORDER — ATORVASTATIN CALCIUM 20 MG PO TABS
80.0000 mg | ORAL_TABLET | Freq: Every evening | ORAL | Status: DC
  Administered 2024-03-24 – 2024-03-25 (×2): 80 mg via ORAL
  Filled 2024-03-24 (×2): qty 4

## 2024-03-24 MED ORDER — AMLODIPINE BESYLATE 10 MG PO TABS
10.0000 mg | ORAL_TABLET | Freq: Every day | ORAL | Status: DC
  Administered 2024-03-24 (×2): 10 mg via ORAL
  Filled 2024-03-24 (×2): qty 2
  Filled 2024-03-24 (×2): qty 1

## 2024-03-24 NOTE — ED Notes (Signed)
 Pt fall bundle in place (socks, bed alarm on, sign on door frame, and armband present)

## 2024-03-24 NOTE — Progress Notes (Signed)
  Received patient in bed to unit.   Informed consent signed and in chart.    TX duration: 3.5hrs     Transported back to floor Hand-off given to patient's nurse. Pt with high pitch moaning but when asked if he's ok he says yeah   Access used: R HD catheter.  Dressing changed  Access issues: none   Total UF removed: none Medication(s) given: none Post HD VS: wnl      Olivia Hurst LPN [facility]

## 2024-03-24 NOTE — ED Notes (Signed)
 This tech placed a condom catheter at this time to collect a urine specimen per RN, Rexene

## 2024-03-24 NOTE — Progress Notes (Signed)
 PROGRESS NOTE    Joseph Mcpherson  FMW:[id number] DOB: 03/08/1946 DOA: 03/23/2024 PCP: Sadie Manna, MD  Chief Complaint  Patient presents with   Altered Mental Status    Hospital Course:  Joseph Mcpherson is a 78 year old with ESRD on HD MWF, hypertension, hyperlipidemia, diabetes, CAD status post CABG, heart failure with EF 40 to 45%, prior CVA, hypothyroidism, dementia, anemia, pulmonary embolism not currently on anticoagulants, hard of hearing, who presents with altered mentation. Patient's family reports he had been doing well until he was started on baclofen due to hiccups.  He reports after 2 doses of baclofen his hiccups resolved but he became acutely confused.  At baseline patient is A and O x 4, ambulatory, independent for most ADLs. On arrival to the ED WBC 12.2, blood pressure 204/76, heart rate and respiratory stable.  CT head negative for acute intracranial abnormalities.  Patient was admitted for telemetry.  Nephrology was consulted for dialysis.  Subjective: No events overnight.  This morning his wife reports he is starting to improve but is not quite back to baseline yet.  She reports his appetite has recently been poor due to initiating Ozempic and that they will discontinue this medication.  Patient is about to receive dialysis   Objective: Vitals:   03/24/24 1300 03/24/24 1330 03/24/24 1400 03/24/24 1505  BP: (!) 170/63 (!) 151/109 (!) 159/70 135/62  Pulse: (!) 59 60 68 65  Resp: (!) 24 (!) 22 (!) 32 20  Temp:    98.2 F (36.8 C)  TempSrc:    Oral  SpO2: 100% 98% 100% 98%  Weight:   61 kg   Height:        Intake/Output Summary (Last 24 hours) at 03/24/2024 1542 Last data filed at 03/24/2024 1400 Gross per 24 hour  Intake --  Output 0 ml  Net 0 ml   Filed Weights   03/23/24 1706 03/24/24 0957 03/24/24 1400  Weight: 62.5 kg 61 kg 61 kg    Examination: General exam: Appears calm and comfortable, NAD  Respiratory system: No work of breathing, symmetric chest  wall expansion Cardiovascular system: S1 & S2 heard, RRR.  Gastrointestinal system: Abdomen is nondistended, soft and nontender.  Neuro: Very hard of hearing, is unable to report back the year, place, situation.  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 - Anticipate mental status to improve after dialysis today. - PT/OT/SLP evals  Leukocytosis - May be reactive. Resolving without abx. No other signs or symptom of acute infection - UA pending  ESRD on HD - Nephrology consulted - Receiving dialysis today  CAD status post CABG - Troponin 40 with repeat 38 - No chest pain - Suspect this troponin is chronic in setting of ESRD and known CAD - Continue aspirin , Plavix , statin  Chronic systolic CHF - EF 40 to 45%.  Clinically euvolemic on arrival.  No shortness of breath - Volume management per dialysis  Hypertension - Blood pressure very elevated on arrival with SBP 204 - Resume home meds - Blood pressure resolved now  History of CVA - High risk of recurrent delirium - Delirium precautions, frequent reorientation - Continue home meds  Anemia of ESRD - Hemoglobin very stable at this time  Alzheimer's disease with late onset - Continue donepezil   Hypothyroidism - Continue  Synthroid   Hyperlipidemia - Continue statin  Type 2 diabetes with renal manifestations, well-controlled - Hemoglobin A1c 5.9% - Continue with basal/bolus and titrate as needed.  Sliding scale insulin  for now.  DVT prophylaxis: Heparin    Code Status: Full Code Disposition: Mid to observation pending clinical resolution.  Hopefully DC in a.m.  Consultants:    Procedures:    Antimicrobials:  Anti-infectives (From admission, onward)    None       Data Reviewed: I have personally reviewed following labs and imaging studies CBC: Recent Labs  Lab 03/23/24 1717 03/24/24 0428  WBC 12.2* 11.3*  NEUTROABS 9.7*  --   HGB 11.1* 9.7*  HCT 35.2* 29.3*  MCV 94.4 92.7  PLT 462* 413*   Basic Metabolic Panel: Recent Labs  Lab 03/23/24 1717 03/24/24 0428  NA 135 138  K 4.5 4.0  CL 94* 97*  CO2 26 28  GLUCOSE 269* 157*  BUN 17 20  CREATININE 6.31* 7.14*  CALCIUM  9.4 8.6*   GFR: Estimated Creatinine Clearance: 7.1 mL/min (A) (by C-G formula based on SCr of 7.14 mg/dL (H)). Liver Function Tests: Recent Labs  Lab 03/23/24 1717  AST 22  ALT 17  ALKPHOS 343*  BILITOT 1.0  PROT 8.1  ALBUMIN 3.7   CBG: Recent Labs  Lab 03/23/24 2257 03/24/24 0753  GLUCAP 229* 163*    No results found for this or any previous visit (from the past 240 hours).   Radiology Studies: CT HEAD WO CONTRAST ( ) Result Date: 03/23/2024 CLINICAL DATA:  Headache, neuro deficit EXAM: CT HEAD WITHOUT CONTRAST TECHNIQUE: Contiguous axial images were obtained from the base of the skull through the vertex without intravenous contrast. RADIATION DOSE REDUCTION: This exam was performed according to the departmental dose-optimization program which includes automated exposure control, adjustment of the mA and/or kV according to patient size and/or use of iterative reconstruction technique. COMPARISON:  Head CT 04/13/2022 FINDINGS: Brain: No intracranial hemorrhage, mass effect, or midline shift.  Stable degree of atrophy and chronic small vessel ischemia. No hydrocephalus. The basilar cisterns are patent. Remote right occipital infarct, unchanged. Chronic mineralization in the basal ganglia. No evidence of territorial infarct or acute ischemia. No extra-axial or intracranial fluid collection. Vascular: Atherosclerosis of skullbase vasculature without hyperdense vessel or abnormal calcification. Skull: No fracture or focal lesion. Sinuses/Orbits: Mucous retention cysts in both maxillary sinuses. Occasional opacification of left mastoid air cells, unchanged. Other: None. IMPRESSION: 1. No acute intracranial abnormality. 2. Stable atrophy and chronic small vessel ischemia. Remote right occipital infarct. Electronically Signed   By: Andrea Gasman M.D.   On: 03/23/2024 17:37    Scheduled Meds:  amLODipine   10 mg Oral Daily   aspirin  EC  81 mg Oral Daily   atorvastatin   80 mg Oral QPM   calcium  acetate  667 mg Oral Q supper   carvedilol  12.5 mg Oral BID WC   cetirizine  10 mg Oral QPM   Chlorhexidine  Gluconate Cloth  6 each Topical Q0600   cholecalciferol   5,000 Units Oral Daily  clopidogrel   75 mg Oral Daily   donepezil   10 mg Oral Daily   heparin   5,000 Units Subcutaneous Q8H   insulin  aspart  0-5 Units Subcutaneous QHS   insulin  aspart  0-9 Units Subcutaneous TID WC   insulin  glargine-yfgn  3 Units Subcutaneous QHS   levothyroxine   137 mcg Oral Q0600   losartan   25 mg Oral Daily   pantoprazole   40 mg Oral Daily   Continuous Infusions:   LOS: 0 days  MDM: Patient is high risk for one or more organ failure.  They necessitate ongoing hospitalization for continued IV therapies and subsequent lab monitoring. Total time spent interpreting labs and vitals, reviewing the medical record, coordinating care amongst consultants and care team members, directly assessing and discussing care with the patient and/or family: 55 min  Christiann Hagerty, DO [facility]  To contact the  attending physician between 7A-7P please use Epic Chat. To contact the covering physician during after hours 7P-7A, please review Amion.  03/24/2024, 3:42 PM   *This document has been created with the assistance of dictation software. Please excuse typographical errors. *

## 2024-03-24 NOTE — Progress Notes (Signed)
 [facility]  ROUNDING NOTE   Subjective:   Joseph Mcpherson is a 78 year old man of Asian/Indian origin with a past medical history of ESRD, on hemodialysis on Tuesday Thursday Saturday schedule, diabetes mellitus type 2, CVA, hypothyroidism who came to the ER with chief complaint of altered mental status and has been admitted under observation for Altered mental status, unspecified altered mental status type [R41.82] Acute metabolic encephalopathy [G93.41] Adverse effect of drug, initial encounter [T50.905A]  Patient is known to our practice from previous admissions and receives outpatient dialysis treatments at [facility] on a TTS schedule, supervised by [facility] nephrologist. Patient is seen and evaluated during dialysis.    HEMODIALYSIS FLOWSHEET:  Blood Flow Rate (mL/min): 0 mL/min Arterial Pressure (mmHg): 27.27 mmHg Venous Pressure (mmHg): -38.18 mmHg TMP (mmHg): 10.5 mmHg Ultrafiltration Rate (mL/min): 0 mL/min Dialysate Flow Rate (mL/min): 300 ml/min  Alert with a blank stare. Inconsistent with responses. Does intermittently release a high pitched moan.   Labs on ED arrival unremarkable for renal patient. Hgb 11.1, 9.7 today. CT head negative for acute findings.   We have been consulted to manage dialysis needs.    Objective:  Vital signs in last 24 hours:  Temp:  [97.8 F (36.6 C)-98.6 F (37 C)] 98 F (36.7 C) (10/31 0957) Pulse Rate:  [57-74] 68 (10/31 1400) Resp:  [14-32] 32 (10/31 1400) BP: (106-204)/(31-110) 159/70 (10/31 1400) SpO2:  [98 %-100 %] 100 % (10/31 1400) Weight:  [61 kg-62.5 kg] 61 kg (10/31 1400)  Weight change:  Filed Weights   03/23/24 1706 03/24/24 0957 03/24/24 1400  Weight: 62.5 kg 61 kg 61 kg    Intake/Output: No intake/output data recorded.   Intake/Output this shift:  No intake/output data recorded.  Physical Exam: General: NAD  Head: Normocephalic, atraumatic. Moist oral mucosal membranes  Eyes: Anicteric   Lungs:  Clear to auscultation, normal effort  Heart: Regular rate and rhythm  Abdomen:  Soft, nontender  Extremities:  No peripheral edema.  Neurologic: Awake, alert, not following commands  Skin: Warm,dry, no rash  Access: RT internal jugular permcath    Basic Metabolic Panel: Recent Labs  Lab 03/23/24 1717 03/24/24 0428  NA 135 138  K 4.5 4.0  CL 94* 97*  CO2 26 28  GLUCOSE 269* 157*  BUN 17 20  CREATININE 6.31* 7.14*  CALCIUM  9.4 8.6*    Liver Function Tests: Recent Labs  Lab 03/23/24 1717  AST 22  ALT 17  ALKPHOS 343*  BILITOT 1.0  PROT 8.1  ALBUMIN 3.7   No results for input(s): LIPASE, AMYLASE in the last 168 hours. No results for input(s): AMMONIA in the last 168 hours.  CBC: Recent Labs  Lab 03/23/24 1717 03/24/24 0428  WBC 12.2* 11.3*  NEUTROABS 9.7*  --   HGB 11.1* 9.7*  HCT 35.2* 29.3*  MCV 94.4 92.7  PLT 462* 413*    Cardiac Enzymes: No results for input(s): CKTOTAL, CKMB, CKMBINDEX, TROPONINI in the last 168 hours.  BNP: Invalid input(s): POCBNP  CBG: Recent Labs  Lab 03/23/24 2257 03/24/24 0753  GLUCAP 229* 163*    Microbiology: Results for orders placed or performed during the hospital encounter of 04/10/22  Resp Panel by RT-PCR (Flu A&B, Covid) Anterior Nasal Swab     Status: None   Collection Time: 04/10/22  3:25 PM   Specimen: Anterior Nasal Swab  Result Value Ref Range Status   SARS Coronavirus 2 by RT PCR NEGATIVE NEGATIVE Final    Comment: (NOTE)  SARS-CoV-2 target nucleic acids are NOT DETECTED.  The SARS-CoV-2 RNA is generally detectable in upper respiratory specimens during the acute phase of infection. The lowest concentration of SARS-CoV-2 viral copies this assay can detect is 138 copies/mL. A negative result does not preclude SARS-Cov-2 infection and should not be used as the sole basis for treatment or other patient management decisions. A negative result may occur with  improper specimen  collection/handling, submission of specimen other than nasopharyngeal swab, presence of viral mutation(s) within the areas targeted by this assay, and inadequate number of viral copies(<138 copies/mL). A negative result must be combined with clinical observations, patient history, and epidemiological information. The expected result is Negative.  Fact Sheet for Patients:  bloggercourse.com  Fact Sheet for Healthcare Providers:  seriousbroker.it  This test is no t yet approved or cleared by the [place]  FDA and  has been authorized for detection and/or diagnosis of SARS-CoV-2 by FDA under an Emergency Use Authorization (EUA). This EUA will remain  in effect (meaning this test can be used) for the duration of the COVID-19 declaration under Section 564(b)(1) of the Act, 21 U.S.C.section 360bbb-3(b)(1), unless the authorization is terminated  or revoked sooner.       Influenza A by PCR NEGATIVE NEGATIVE Final   Influenza B by PCR NEGATIVE NEGATIVE Final    Comment: (NOTE) The Xpert Xpress SARS-CoV-2/FLU/RSV plus assay is intended as an aid in the diagnosis of influenza from Nasopharyngeal swab specimens and should not be used as a sole basis for treatment. Nasal washings and aspirates are unacceptable for Xpert Xpress SARS-CoV-2/FLU/RSV testing.  Fact Sheet for Patients: bloggercourse.com  Fact Sheet for Healthcare Providers: seriousbroker.it  This test is not yet approved or cleared by the [place]  FDA and has been authorized for detection and/or diagnosis of SARS-CoV-2 by FDA under an Emergency Use Authorization (EUA). This EUA will remain in effect (meaning this test can be used) for the duration of the COVID-19 declaration under Section 564(b)(1) of the Act, 21 U.S.C. section 360bbb-3(b)(1), unless the authorization is terminated or revoked.  Performed at [facility], [address] [place], [place] [address]   Blood culture (routine x 2)     Status: None   Collection Time: 04/10/22  4:37 PM   Specimen: BLOOD  Result Value Ref Range Status   Specimen Description BLOOD BLOOD LEFT ARM  Final   Special Requests   Final    BOTTLES DRAWN AEROBIC AND ANAEROBIC Blood Culture adequate volume   Culture   Final    NO GROWTH 5 DAYS Performed at [facility], [address], [place], [place] [address]    Report Status 04/15/2022 FINAL  Final  Blood culture (routine x 2)     Status: None   Collection Time: 04/10/22  4:37 PM   Specimen: BLOOD  Result Value Ref Range Status   Specimen Description BLOOD BLOOD LEFT ARM  Final   Special Requests   Final    BOTTLES DRAWN AEROBIC AND ANAEROBIC Blood Culture adequate volume   Culture   Final    NO GROWTH 5 DAYS Performed at [facility], [address], [place], [place] [address]    Report Status 04/15/2022 FINAL  Final  MRSA Next Gen by PCR, Nasal     Status: None   Collection Time: 04/10/22  6:53 PM   Specimen: Nasal Mucosa; Nasal Swab  Result Value Ref Range Status   MRSA by PCR Next Gen NOT DETECTED NOT DETECTED  Final    Comment: (NOTE) The GeneXpert MRSA Assay (FDA approved for NASAL specimens only), is one component of a comprehensive MRSA colonization surveillance program. It is not intended to diagnose MRSA infection nor to guide or monitor treatment for MRSA infections. Test performance is not FDA approved in patients less than 48 years old. Performed at [facility], [address] [place], [place] [address]   C Difficile Quick Screen w PCR reflex     Status: None   Collection Time: 04/12/22 10:00 AM   Specimen: STOOL  Result Value Ref Range Status   C Diff antigen NEGATIVE NEGATIVE Final   C Diff toxin NEGATIVE NEGATIVE Final   C Diff interpretation No C. difficile detected.  Final    Comment: Performed at [facility], [address] [place], [place] [address]  Respiratory (~20 pathogens) panel by PCR     Status: None   Collection Time: 04/13/22  5:15 AM   Specimen: Nasopharyngeal Swab; Respiratory  Result Value Ref Range Status   Adenovirus NOT DETECTED NOT DETECTED Final   Coronavirus 229E NOT DETECTED NOT DETECTED Final    Comment: (NOTE) The Coronavirus on the Respiratory Panel, DOES NOT test for the novel  Coronavirus (2019 nCoV)    Coronavirus HKU1 NOT DETECTED NOT DETECTED Final   Coronavirus NL63 NOT DETECTED NOT DETECTED Final   Coronavirus OC43 NOT DETECTED NOT DETECTED Final   Metapneumovirus NOT DETECTED NOT DETECTED Final   Rhinovirus / Enterovirus NOT DETECTED NOT DETECTED Final   Influenza A NOT DETECTED NOT DETECTED Final   Influenza B NOT DETECTED NOT DETECTED Final   Parainfluenza Virus 1 NOT DETECTED NOT DETECTED Final   Parainfluenza Virus 2 NOT DETECTED NOT DETECTED Final   Parainfluenza Virus 3 NOT DETECTED NOT DETECTED Final   Parainfluenza Virus 4 NOT DETECTED NOT DETECTED Final   Respiratory Syncytial Virus NOT DETECTED NOT DETECTED Final   Bordetella pertussis NOT DETECTED NOT DETECTED Final   Bordetella Parapertussis NOT DETECTED NOT DETECTED Final   Chlamydophila pneumoniae NOT DETECTED NOT DETECTED Final   Mycoplasma pneumoniae NOT DETECTED NOT DETECTED Final    Comment: Performed at [facility], [address], [place], [place] [address]    Coagulation Studies: No results for input(s): LABPROT, INR in the last 72 hours.  Urinalysis: No results for input(s): COLORURINE, LABSPEC, PHURINE, GLUCOSEU, HGBUR, BILIRUBINUR, KETONESUR, PROTEINUR, UROBILINOGEN, NITRITE, LEUKOCYTESUR in the last 72 hours.  Invalid input(s): APPERANCEUR    Imaging: CT HEAD WO CONTRAST ( ) Result Date: 03/23/2024 CLINICAL DATA:  Headache, neuro deficit EXAM: CT HEAD WITHOUT CONTRAST TECHNIQUE: Contiguous axial images were obtained from the base of the skull through the  vertex without intravenous contrast. RADIATION DOSE REDUCTION: This exam was performed according to the departmental dose-optimization program which includes automated exposure control, adjustment of the mA and/or kV according to patient size and/or use of iterative reconstruction technique. COMPARISON:  Head CT 04/13/2022 FINDINGS: Brain: No intracranial hemorrhage, mass effect, or midline shift. Stable degree of atrophy and chronic small vessel ischemia. No hydrocephalus. The basilar cisterns are patent. Remote right occipital infarct, unchanged. Chronic mineralization in the basal ganglia. No evidence of territorial infarct or acute ischemia. No extra-axial or intracranial fluid collection. Vascular: Atherosclerosis of skullbase vasculature without hyperdense vessel or abnormal calcification. Skull: No fracture or focal lesion. Sinuses/Orbits: Mucous retention cysts in both maxillary sinuses. Occasional opacification of left mastoid air cells, unchanged. Other: None. IMPRESSION: 1. No acute intracranial abnormality. 2. Stable atrophy  and chronic small vessel ischemia. Remote right occipital infarct. Electronically Signed   By: Andrea Gasman M.D.   On: 03/23/2024 17:37     Medications:     amLODipine   10 mg Oral Daily   aspirin  EC  81 mg Oral Daily   atorvastatin   80 mg Oral QPM   calcium  acetate  667 mg Oral Q supper   carvedilol  12.5 mg Oral BID WC   cetirizine  10 mg Oral QPM   Chlorhexidine  Gluconate Cloth  6 each Topical Q0600   cholecalciferol   5,000 Units Oral Daily   clopidogrel   75 mg Oral Daily   donepezil   10 mg Oral Daily   heparin   5,000 Units Subcutaneous Q8H   insulin  aspart  0-5 Units Subcutaneous QHS   insulin  aspart  0-9 Units Subcutaneous TID WC   insulin  glargine-yfgn  3 Units Subcutaneous QHS   levothyroxine   137 mcg Oral Q0600   losartan   25 mg Oral Daily   pantoprazole   40 mg Oral Daily   acetaminophen , albuterol , dextromethorphan-guaiFENesin , hydrALAZINE ,  ondansetron  (ZOFRAN ) IV  Assessment/ Plan:  Joseph Mcpherson is a 78 y.o.  male with a past medical history of ESRD, on hemodialysis on Tuesday Thursday Saturday schedule, diabetes mellitus type 2, CVA, hypothyroidism who came to the ER with chief complaint of altered mental status and has been admitted under observation for Altered mental status, unspecified altered mental status type [R41.82] Acute metabolic encephalopathy [G93.41] Adverse effect of drug, initial encounter [T50.905A]  [facility]/TTS/right chest PermCath   Acute Metabolic Encephalopathy. Recent new medication, baclofen. Likely cause of this. CT head negative. Workup in progress. Baclofen held.   2. End stage renal disease on hemodialysis. Last treatment received on Wednesday. Receiving dialysis today, no UF due to hypotension. Next treatment scheduled for Monday  3. Anemia of chronic kidney disease Lab Results  Component Value Date   HGB 9.7 (L) 03/24/2024    Hgb borderline. May consider ESA with next treatment.   4. 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 Home regimen includes Novolog . Most recent hemoglobin A1c is 7.5 on 01/26/24.    5. Secondary Hyperparathyroidism: with outpatient labs: PTH 469, phosphorus 2.9, calcium  9.3 on 02/28/24.   Lab Results  Component Value Date   CALCIUM  8.6 (L) 03/24/2024   CAION 1.16 05/13/2023   PHOS 2.6 04/14/2022     Patient prescribed calcium  acetate with meals outpatient along with calcitriol. Will continue to monitor bone minerals.    LOS: 0 Cashel Bellina 10/31/20253:02 PM

## 2024-03-24 NOTE — ED Notes (Signed)
 Attempted to feed pt at this time, Pt wouldn't not swallow any food provided to him. RN notified

## 2024-03-24 NOTE — ED Notes (Signed)
Pt incontinent of urine, pt cleaned and changed 

## 2024-03-24 NOTE — ED Notes (Signed)
 Pt to dialysis.

## 2024-03-25 ENCOUNTER — Other Ambulatory Visit: Payer: Self-pay

## 2024-03-25 ENCOUNTER — Encounter: Payer: Self-pay | Admitting: Internal Medicine

## 2024-03-25 DIAGNOSIS — G9341 Metabolic encephalopathy: Secondary | ICD-10-CM | POA: Diagnosis not present

## 2024-03-25 DIAGNOSIS — G301 Alzheimer's disease with late onset: Secondary | ICD-10-CM | POA: Diagnosis not present

## 2024-03-25 DIAGNOSIS — I10 Essential (primary) hypertension: Secondary | ICD-10-CM | POA: Diagnosis not present

## 2024-03-25 DIAGNOSIS — D631 Anemia in chronic kidney disease: Secondary | ICD-10-CM | POA: Diagnosis not present

## 2024-03-25 DIAGNOSIS — E039 Hypothyroidism, unspecified: Secondary | ICD-10-CM | POA: Diagnosis not present

## 2024-03-25 DIAGNOSIS — N2581 Secondary hyperparathyroidism of renal origin: Secondary | ICD-10-CM | POA: Diagnosis not present

## 2024-03-25 DIAGNOSIS — E1122 Type 2 diabetes mellitus with diabetic chronic kidney disease: Secondary | ICD-10-CM | POA: Diagnosis not present

## 2024-03-25 DIAGNOSIS — N186 End stage renal disease: Secondary | ICD-10-CM | POA: Diagnosis not present

## 2024-03-25 DIAGNOSIS — I2583 Coronary atherosclerosis due to lipid rich plaque: Secondary | ICD-10-CM | POA: Diagnosis not present

## 2024-03-25 DIAGNOSIS — E782 Mixed hyperlipidemia: Secondary | ICD-10-CM | POA: Diagnosis not present

## 2024-03-25 DIAGNOSIS — I5022 Chronic systolic (congestive) heart failure: Secondary | ICD-10-CM | POA: Diagnosis not present

## 2024-03-25 DIAGNOSIS — I2510 Atherosclerotic heart disease of native coronary artery without angina pectoris: Secondary | ICD-10-CM | POA: Diagnosis not present

## 2024-03-25 DIAGNOSIS — Z8673 Personal history of transient ischemic attack (TIA), and cerebral infarction without residual deficits: Secondary | ICD-10-CM | POA: Diagnosis not present

## 2024-03-25 DIAGNOSIS — D72829 Elevated white blood cell count, unspecified: Secondary | ICD-10-CM | POA: Diagnosis not present

## 2024-03-25 LAB — RENAL FUNCTION PANEL
Albumin: 2.7 g/dL — ABNORMAL LOW (ref 3.5–5.0)
Anion gap: 11 (ref 5–15)
BUN: 12 mg/dL (ref 8–23)
CO2: 28 mmol/L (ref 22–32)
Calcium: 8.4 mg/dL — ABNORMAL LOW (ref 8.9–10.3)
Chloride: 93 mmol/L — ABNORMAL LOW (ref 98–111)
Creatinine, Ser: 5.09 mg/dL — ABNORMAL HIGH (ref 0.61–1.24)
GFR, Estimated: 11 mL/min — ABNORMAL LOW (ref >60)
Glucose, Bld: 141 mg/dL — ABNORMAL HIGH (ref 70–99)
Phosphorus: 2.7 mg/dL (ref 2.5–4.6)
Potassium: 3.7 mmol/L (ref 3.5–5.1)
Sodium: 132 mmol/L — ABNORMAL LOW (ref 135–145)

## 2024-03-25 LAB — CBC
HCT: 27.7 % — ABNORMAL LOW (ref 39.0–52.0)
Hemoglobin: 8.8 g/dL — ABNORMAL LOW (ref 13.0–17.0)
MCH: 29.7 pg (ref 26.0–34.0)
MCHC: 31.8 g/dL (ref 30.0–36.0)
MCV: 93.6 fL (ref 80.0–100.0)
Platelets: 334 K/uL (ref 150–400)
RBC: 2.96 MIL/uL — ABNORMAL LOW (ref 4.22–5.81)
RDW: 14.4 % (ref 11.5–15.5)
WBC: 10.9 K/uL — ABNORMAL HIGH (ref 4.0–10.5)
nRBC: 0 % (ref 0.0–0.2)

## 2024-03-25 LAB — GLUCOSE, CAPILLARY
Glucose-Capillary: 92 mg/dL (ref 70–99)
Glucose-Capillary: 105 mg/dL — ABNORMAL HIGH (ref 70–99)
Glucose-Capillary: 111 mg/dL — ABNORMAL HIGH (ref 70–99)

## 2024-03-25 MED ORDER — LEVOTHYROXINE SODIUM 100 MCG PO TABS: 100.0000 ug | ORAL_TABLET | Freq: Every day | ORAL | 0 refills | Status: AC

## 2024-03-25 MED ORDER — HEPARIN SODIUM (PORCINE) 1000 UNIT/ML IJ SOLN
INTRAMUSCULAR | Status: AC
Start: 2024-03-25 — End: 2024-03-25
  Filled 2024-03-25: qty 4

## 2024-03-25 NOTE — Evaluation (Signed)
 Occupational Therapy Evaluation Patient Details Name: Joseph Mcpherson MRN: [id number] DOB: 12/23/1945 Today's Date: 03/25/2024   History of Present Illness   Joseph Mcpherson is a 78 year old with ESRD on HD MWF, hypertension, hyperlipidemia, diabetes, CAD status post CABG, heart failure with EF 40 to 45%, prior CVA, hypothyroidism, dementia, anemia, pulmonary embolism not currently on anticoagulants, hard of hearing, who presents with altered mentation.  Patient's family reports he had been doing well until he was started on baclofen due to hiccups.  He reports after 2 doses of baclofen his hiccups resolved but he became acutely confused.  At baseline patient is A and O x 4, ambulatory, independent for most ADLs.     Clinical Impressions Mr. Dalsanto presents this date with generalized weakness, limited endurance, and mild confusion. PTA, he lives with his wife, is able to perform BADLs with SUPV from his wife; wife assists pt with all IADLs. Per wife, as of yesterday, pt was highly confused and lethargic, not oriented to self, time, place, or situation. During today's session, pt is able to provide his name, birthday, and year and is aware he is in a hospital. He required increased time and ongoing cueing/encouragement for all movement, but is able to perform bed mobility, transfers, ambulation with RW and with SUPV-CGA for safety. Recommend ongoing OT while hospitalized, with HHOT post DC to assist pt in return to PLOF.    If plan is discharge home, recommend the following:   A little help with walking and/or transfers;A little help with bathing/dressing/bathroom;Assistance with cooking/housework;Direct supervision/assist for financial management;Supervision due to cognitive status;Assist for transportation;Direct supervision/assist for medications management;Help with stairs or ramp for entrance     Functional Status Assessment   Patient has had a recent decline in their functional status and  demonstrates the ability to make significant improvements in function in a reasonable and predictable amount of time.     Equipment Recommendations   None recommended by OT     Recommendations for Other Services         Precautions/Restrictions   Precautions Precautions: Fall Restrictions Weight Bearing Restrictions Per Provider Order: No     Mobility Bed Mobility Overal bed mobility: Needs Assistance Bed Mobility: Supine to Sit, Sit to Supine     Supine to sit: Contact guard Sit to supine: Supervision   General bed mobility comments: requires extra time and ongoing encouragement for task continuation    Transfers Overall transfer level: Needs assistance Equipment used: Rolling walker (2 wheels) Transfers: Sit to/from Stand Sit to Stand: Contact guard assist                  Balance Overall balance assessment: Needs assistance Sitting-balance support: Bilateral upper extremity supported, Feet supported Sitting balance-Leahy Scale: Fair Sitting balance - Comments: Telemetry box in R pocket of gown is heavy for pt, leading him to require support and leaning to R Postural control: Right lateral lean Standing balance support: Bilateral upper extremity supported, During functional activity, Reliant on assistive device for balance Standing balance-Leahy Scale: Fair                             ADL either performed or assessed with clinical judgement   ADL Overall ADL's : Needs assistance/impaired Eating/Feeding: Minimal assistance Eating/Feeding Details (indicate cue type and reason): wife is feeding pt, encouraging him to eat, with pt stating he is not hungry  Lower Body Dressing: Moderate assistance               Functional mobility during ADLs: Minimal assistance;Moderate assistance;Rolling walker (2 wheels)       Vision         Perception         Praxis         Pertinent Vitals/Pain Pain  Assessment Pain Assessment: No/denies pain     Extremity/Trunk Assessment Upper Extremity Assessment Upper Extremity Assessment: Generalized weakness   Lower Extremity Assessment Lower Extremity Assessment: Generalized weakness       Communication Communication Communication: Impaired Factors Affecting Communication: Hearing impaired   Cognition Arousal: Alert Behavior During Therapy: WFL for tasks assessed/performed Cognition: History of cognitive impairments                               Following commands: Intact       Cueing  General Comments   Cueing Techniques: Verbal cues;Tactile cues      Exercises Other Exercises Other Exercises: Educ for wife re: possible sources of community support for caregivers; Educ re: POC, DC recs   Shoulder Instructions      Home Living Family/patient expects to be discharged to:: Private residence Living Arrangements: Spouse/significant other Available Help at Discharge: Family Type of Home: House Home Access: Stairs to enter Secretary/administrator of Steps: 2 Entrance Stairs-Rails: Right;Left;Can reach both Home Layout: Two level;Able to live on main level with bedroom/bathroom     Bathroom Shower/Tub: Producer, Television/film/video: Standard     Home Equipment: Agricultural Consultant (2 wheels);Shower seat;Grab bars - toilet;Grab bars - tub/shower          Prior Functioning/Environment               Mobility Comments: Pt ambulates with RW, no recent falls hx ADLs Comments: Pt is able to perform BADL with wife supervising for safety; he is dependent on wife for IADL    OT Problem List: Decreased strength;Decreased activity tolerance;Impaired balance (sitting and/or standing);Decreased cognition   OT Treatment/Interventions: Self-care/ADL training;Therapeutic exercise;Patient/family education;Balance training;Therapeutic activities;DME and/or AE instruction;Cognitive remediation/compensation;Energy  conservation      OT Goals(Current goals can be found in the care plan section)   Acute Rehab OT Goals Patient Stated Goal: To build back strength OT Goal Formulation: With family Time For Goal Achievement: 04/08/24 Potential to Achieve Goals: Good ADL Goals Pt Will Perform Grooming: with supervision (in sitting or standing) Pt Will Perform Lower Body Dressing: with set-up;with supervision;sitting/lateral leans Pt Will Transfer to Toilet: with supervision;ambulating;regular height toilet   OT Frequency:  Min 1X/week    Co-evaluation              AM-PAC OT 6 Clicks Daily Activity     Outcome Measure Help from another person eating meals?: A Little Help from another person taking care of personal grooming?: A Little Help from another person toileting, which includes using toliet, bedpan, or urinal?: A Lot Help from another person bathing (including washing, rinsing, drying)?: A Lot Help from another person to put on and taking off regular upper body clothing?: A Little Help from another person to put on and taking off regular lower body clothing?: A Lot 6 Click Score: 15   End of Session Equipment Utilized During Treatment: Rolling walker (2 wheels)  Activity Tolerance: Patient tolerated treatment well Patient left: in bed;with family/visitor present;with nursing/sitter in room  OT Visit  Diagnosis: Unsteadiness on feet (R26.81);Muscle weakness (generalized) (M62.81)                Time: 9073-9046 OT Time Calculation (min): 27 min Charges:  OT General Charges $OT Visit: 1 Visit OT Evaluation $OT Eval Moderate Complexity: 1 Mod OT Treatments $Self Care/Home Management : 8-22 mins Suzen Hock, PhD, MS, OTR/L 03/25/24, 10:18 AM

## 2024-03-25 NOTE — Progress Notes (Signed)
   03/25/24 1320  Vitals  BP (!) 113/54  Pulse Rate (!) 54  ECG Heart Rate (!) 54  Resp 13  Weight 60.5 kg  Oxygen Therapy  SpO2 100 %  During Treatment Monitoring  Blood Flow Rate (mL/min) 0 mL/min  Arterial Pressure (mmHg) -2.83 mmHg  Venous Pressure (mmHg) -2.83 mmHg  TMP (mmHg) -4.04 mmHg  Ultrafiltration Rate (mL/min) 547 mL/min  Dialysate Flow Rate (mL/min) 299 ml/min  Duration of HD Treatment -hour(s) 2 hour(s)  Cumulative Fluid Removed (mL) per Treatment  589.7  Post Treatment  Liters Processed 48  Fluid Removed (mL) 600 mL  Tolerated HD Treatment Yes  Post-Hemodialysis Comments Tx has been tolerated. Goal has been met. Vs are stable. No verbalized concerns post tx

## 2024-03-25 NOTE — Progress Notes (Signed)
 PT Cancellation Note  Patient Details Name: Joseph Mcpherson MRN: [id number] DOB: Jul 24, 1945   Cancelled Treatment:    Reason Eval/Treat Not Completed: Patient out of room at HD, will attempt to see pt at a future date/time as medically appropriate.     CHARM Glendia Bertin PT, DPT 03/25/24, 10:46 AM

## 2024-03-25 NOTE — Progress Notes (Signed)
 Patent is off floor, in dialysis

## 2024-03-25 NOTE — Discharge Summary (Signed)
 DISCHARGE SUMMARY    Joseph Mcpherson FMW:[id number] DOB: 1945/12/22 DOA: 03/23/2024  PCP: Sadie Manna, MD  Admit date: 03/23/2024 Discharge date: 03/25/2024   Recommendations for Outpatient Follow-up:  Follow up with PCP in 1-2 weeks to review chronic condition management and repeat [facility]   Hospital Course: Joseph Mcpherson is a 78 year old with ESRD on HD MWF, hypertension, hyperlipidemia, diabetes, CAD status post CABG, heart failure with EF 40 to 45%, prior CVA, hypothyroidism, dementia, anemia, pulmonary embolism not currently on anticoagulants, hard of hearing, who presents with altered mentation. Patient's family reports he had been doing well until he was started on baclofen due to hiccups.  He reports after 2 doses of baclofen his hiccups resolved but he became acutely confused.  At baseline patient is A and O x 4, ambulatory, independent for most ADLs. On arrival to the ED WBC 12.2, blood pressure 204/76, heart rate and respiratory stable.  CT head negative for acute intracranial abnormalities.  Patient was admitted for telemetry.  Nephrology was consulted for dialysis. Patient received dialysis on 10/31 and 11/1.  By reevaluation after dialysis patient has returned to his normal mental status.  He is now alert and oriented x 4.  On day of discharge his wife agrees that he is back to his physiologic baseline and is ready for DC home.   Acute metabolic encephalopathy - Likely secondary to baclofen side effect.  Patient has returned to normal after dialysis - Head CT negative - TSH <0.100, have significantly reduced patient Synthroid  - No focal neurodeficits - Discontinue baclofen  Leukocytosis - May be reactive. Resolving without abx. No other signs or symptom of acute infection -UA ordered but still pending.  Denies lower urinary tract symptoms  ESRD on HD -Dialysis x 2 - Continue with outpatient dialysis as scheduled   CAD status post CABG - Troponin 40 with repeat 38 - No  chest pain - Suspect this troponin is chronic in setting of ESRD and known CAD - Continue aspirin , Plavix , statin   Chronic systolic CHF - EF 40 to 45%.  Clinically euvolemic on arrival.  No shortness of breath - Volume management per dialysis   Hypertension - Blood pressure very elevated on arrival with SBP 204 - Has resolved to baseline.  Continue home meds   History of CVA - High risk of recurrent delirium - Delirium precautions, frequent reorientation - Continue home meds - PT/OT Home health ordered at DC   Anemia of ESRD - Hemoglobin very stable at this time   Alzheimer's disease with late onset - Continue donepezil    Hypothyroidism - TSH <0.100 currently on Synthroid  137, decreased to 100 mcg.  Will need close outpatient follow-up with PCP/endocrinology for TSH titrating   Hyperlipidemia - Continue statin   Type 2 diabetes with renal manifestations, well-controlled - Hemoglobin A1c 5.9% - Follows with endocrinology outpatient. - Continue home meds  Discharge Instructions  Discharge Instructions     Call MD for:  difficulty breathing, headache or visual disturbances   Complete by: As directed    Call MD for:  persistant dizziness or light-headedness   Complete by: As directed    Call MD for:  persistant nausea and vomiting   Complete by: As directed    Call MD for:  severe uncontrolled pain   Complete by: As directed    Call MD for:  temperature >100.4   Complete by: As directed    Diet general   Complete by: As directed    Discharge instructions  Complete by: As directed    Stop taking baclofen.    I have decreased your dose of levothyroxine , the new dose has been called into the pharmacy.  Please follow-up with your endocrinologist or PCP to recheck your TSH in 6 weeks and make further medication adjustments.  Continue with your dialysis as scheduled   Increase activity slowly   Complete by: As directed       Allergies as of 03/25/2024   No Known  Allergies      Medication List     TAKE these medications    amLODipine  10 MG tablet Commonly known as: NORVASC  Take 1 tablet (10 mg total) by mouth daily.   aspirin  EC 81 MG tablet Take 81 mg by mouth daily. Swallow whole.   atorvastatin  80 MG tablet Commonly known as: LIPITOR Take 80 mg by mouth every evening.   BD Pen Needle Nano 2nd Gen 32G X 4 MM Misc Generic drug: Insulin  Pen Needle USE AS DIRECTED   calcium  acetate 667 MG capsule Commonly known as: PHOSLO  TAKE 2 CAPSULES BY MOUTH 3 TIMES A DAY WITH MEAL What changed: See the new instructions.   carvedilol 25 MG tablet Commonly known as: COREG Take 12.5 mg by mouth 2 (two) times daily with a meal.   clopidogrel  75 MG tablet Commonly known as: PLAVIX  Take 1 tablet (75 mg total) by mouth daily.   cyanocobalamin 1000 MCG tablet Commonly known as: VITAMIN B12 Take 1,000 mcg by mouth daily.   donepezil  10 MG tablet Commonly known as: ARICEPT  TAKE 1 TABLET BY MOUTH EVERY DAY   FreeStyle Libre 14 Day Sensor Misc APPLY EVERY 14 (FOURTEEN) DAYS.   insulin  aspart 100 UNIT/ML injection Commonly known as: novoLOG  Inject into the skin 3 (three) times daily with meals. Sliding Scale   insulin  detemir 100 UNIT/ML injection Commonly known as: LEVEMIR  Inject 0.15 mLs (15 Units total) into the skin at bedtime. What changed: how much to take   levocetirizine 5 MG tablet Commonly known as: XYZAL  Take 5 mg by mouth every evening.   levothyroxine  100 MCG tablet Commonly known as: SYNTHROID  Take 1 tablet (100 mcg total) by mouth daily. What changed:  medication strength how much to take   losartan  25 MG tablet Commonly known as: COZAAR  Take 25 mg by mouth daily.   ondansetron  4 MG disintegrating tablet Commonly known as: ZOFRAN -ODT Take 1 tablet (4 mg total) by mouth every 6 (six) hours as needed for nausea or vomiting.   pantoprazole  40 MG tablet Commonly known as: PROTONIX  TAKE 1 TABLET BY MOUTH EVERY  DAY   polyethylene glycol 17 g packet Commonly known as: MiraLax  Take 17 g by mouth daily.   Vitamin D3 125 MCG (5000 UT) Tabs Take 5,000 Units by mouth daily.        No Known Allergies  Consultations:    Procedures/Studies: CT HEAD WO CONTRAST ( ) Result Date: 03/23/2024 CLINICAL DATA:  Headache, neuro deficit EXAM: CT HEAD WITHOUT CONTRAST TECHNIQUE: Contiguous axial images were obtained from the base of the skull through the vertex without intravenous contrast. RADIATION DOSE REDUCTION: This exam was performed according to the departmental dose-optimization program which includes automated exposure control, adjustment of the mA and/or kV according to patient size and/or use of iterative reconstruction technique. COMPARISON:  Head CT 04/13/2022 FINDINGS: Brain: No intracranial hemorrhage, mass effect, or midline shift. Stable degree of atrophy and chronic small vessel ischemia. No hydrocephalus. The basilar cisterns are patent. Remote right occipital infarct, unchanged.  Chronic mineralization in the basal ganglia. No evidence of territorial infarct or acute ischemia. No extra-axial or intracranial fluid collection. Vascular: Atherosclerosis of skullbase vasculature without hyperdense vessel or abnormal calcification. Skull: No fracture or focal lesion. Sinuses/Orbits: Mucous retention cysts in both maxillary sinuses. Occasional opacification of left mastoid air cells, unchanged. Other: None. IMPRESSION: 1. No acute intracranial abnormality. 2. Stable atrophy and chronic small vessel ischemia. Remote right occipital infarct. Electronically Signed   By: Andrea Gasman M.D.   On: 03/23/2024 17:37   DG Chest 2 View Result Date: 03/17/2024 CLINICAL DATA:  Hiccups EXAM: CHEST - 2 VIEW COMPARISON:  04/28/2022 FINDINGS: Prior CABG. Right dialysis catheter in place with the tip in the right atrium. Lungs clear. No effusions or pneumothorax. No acute bony abnormality. IMPRESSION: No active  cardiopulmonary disease. Electronically Signed   By: Franky Crease M.D.   On: 03/17/2024 12:07      Discharge Exam: Vitals:   03/25/24 1320 03/25/24 1443  BP: (!) 113/54 (!) 119/58  Pulse: (!) 54 (!) 56  Resp: 13 16  Temp:  98.1 F (36.7 C)  SpO2: 100% 100%   Vitals:   03/25/24 1256 03/25/24 1257 03/25/24 1320 03/25/24 1443  BP:   (!) 113/54 (!) 119/58  Pulse: (!) 55 (!) 56 (!) 54 (!) 56  Resp: 17 18 13 16   Temp:  98 F (36.7 C)  98.1 F (36.7 C)  TempSrc:  Oral    SpO2: 100% 100% 100% 100%  Weight:   60.5 kg   Height:        Constitutional:  Normal appearance. Non toxic-appearing.  HENT: Head Normocephalic and atraumatic.  Mucous membranes are moist.  Eyes:  Extraocular intact. Conjunctivae normal.  Cardiovascular: Rate and Rhythm: Normal rate and regular rhythm.  Pulmonary: Non labored, symmetric rise of chest wall.  Skin: warm and dry. not jaundiced.  Neurological: alert. Oriented x 4 Psychiatric: Mood and Affect congruent.    The results of significant diagnostics from this hospitalization (including imaging, microbiology, ancillary and laboratory) are listed below for reference.     Microbiology: No results found for this or any previous visit (from the past 240 hours).   Labs: BNP (last 3 results) No results for input(s): BNP in the last 8760 hours. Basic Metabolic Panel: Recent Labs  Lab 03/23/24 1717 03/24/24 0428 03/25/24 1050  NA 135 138 132*  K 4.5 4.0 3.7  CL 94* 97* 93*  CO2 26 28 28   GLUCOSE 269* 157* 141*  BUN 17 20 12   CREATININE 6.31* 7.14* 5.09*  CALCIUM  9.4 8.6* 8.4*  PHOS  --   --  2.7   Liver Function Tests: Recent Labs  Lab 03/23/24 1717 03/25/24 1050  AST 22  --   ALT 17  --   ALKPHOS 343*  --   BILITOT 1.0  --   PROT 8.1  --   ALBUMIN 3.7 2.7*   No results for input(s): LIPASE, AMYLASE in the last 168 hours. No results for input(s): AMMONIA in the last 168 hours. CBC: Recent Labs  Lab 03/23/24 1717  03/24/24 0428 03/25/24 1050  WBC 12.2* 11.3* 10.9*  NEUTROABS 9.7*  --   --   HGB 11.1* 9.7* 8.8*  HCT 35.2* 29.3* 27.7*  MCV 94.4 92.7 93.6  PLT 462* 413* 334   Cardiac Enzymes: No results for input(s): CKTOTAL, CKMB, CKMBINDEX, TROPONINI in the last 168 hours. BNP: Invalid input(s): POCBNP CBG: Recent Labs  Lab 03/24/24 1657 03/24/24 2033 03/25/24 9251  03/25/24 1257 03/25/24 1558  GLUCAP 150* 181* 92 105* 111*   D-Dimer No results for input(s): DDIMER in the last 72 hours. Hgb A1c No results for input(s): HGBA1C in the last 72 hours. Lipid Profile No results for input(s): CHOL, HDL, LDLCALC, TRIG, CHOLHDL, LDLDIRECT in the last 72 hours. Thyroid  function studies Recent Labs    03/24/24 1610  TSH <0.100*   Anemia work up No results for input(s): VITAMINB12, FOLATE, FERRITIN, TIBC, IRON, RETICCTPCT in the last 72 hours. Urinalysis    Component Value Date/Time   COLORURINE Straw 09/11/2013 1933   APPEARANCEUR Clear 09/11/2013 1933   LABSPEC 1.016 09/11/2013 1933   PHURINE 7.0 09/11/2013 1933   GLUCOSEU >=500 09/11/2013 1933   HGBUR Negative 09/11/2013 1933   BILIRUBINUR Negative 09/11/2013 1933   KETONESUR Negative 09/11/2013 1933   PROTEINUR 100 mg/dL 95/79/7984 8066   NITRITE Negative 09/11/2013 1933   LEUKOCYTESUR Negative 09/11/2013 1933   Sepsis Labs Recent Labs  Lab 03/23/24 1717 03/24/24 0428 03/25/24 1050  WBC 12.2* 11.3* 10.9*   Microbiology No results found for this or any previous visit (from the past 240 hours).   Time coordinating discharge: 32 min   SIGNED: Teretha Chalupa, DO [facility] 03/25/2024, 4:51 PM Pager   If 7PM-7AM, please contact night-coverage

## 2024-03-25 NOTE — Progress Notes (Signed)
 SLP Cancellation Note  Patient Details Name: Joseph Mcpherson MRN: [id number] DOB: 1945/09/02   Cancelled treatment:       Reason Eval/Treat Not Completed: Patient at procedure or test/unavailable (dialysis)  Pt with known history of cognitive communication impairment s/p CVA and in the presence of dementia. Last evaluation completed at OSH on 07/12/22, reporting deficits noted in memory recall, attention, generative naming and visuospatial abilities. Head CT during admission revealing, No acute intracranial abnormality. Stable atrophy and chronic small vessel ischemia. Remote right occipital infarct. Pt admitted for acute metabolic encephalopathy. Per physician progress note, spouse reporting mentation is improving. RN also reporting improved mentation. Given lack of acute neuro injury, suspect that mentation will continue to improve with medical stabilization. No acute SLP services indicated. Recommend follow up at discharge if recommended by family. Physician aware.   Darcelle Herrada Clapp, MS, CCC-SLP Speech Language Pathologist Rehab Services; [facility] [phone] (ascom)   Brehanna Deveny J Clapp 03/25/2024, 10:54 AM

## 2024-03-25 NOTE — Plan of Care (Signed)
  Problem: Coping: Goal: Ability to adjust to condition or change in health will improve Outcome: Progressing   Problem: Fluid Volume: Goal: Ability to maintain a balanced intake and output will improve Outcome: Progressing   Problem: Nutritional: Goal: Maintenance of adequate nutrition will improve Outcome: Not Progressing   Problem: Skin Integrity: Goal: Risk for impaired skin integrity will decrease Outcome: Progressing

## 2024-03-25 NOTE — Progress Notes (Addendum)
 [facility]  ROUNDING NOTE   Subjective:   Joseph Mcpherson is a 78 year old man of Asian/Indian origin with a past medical history of ESRD, on hemodialysis on Tuesday Thursday Saturday schedule, diabetes mellitus type 2, CVA, hypothyroidism who came to the ER with chief complaint of altered mental status and has been admitted under observation for Altered mental status, unspecified altered mental status type [R41.82] Acute metabolic encephalopathy [G93.41] Adverse effect of drug, initial encounter [T50.905A]  Patient is known to our practice from previous admissions and receives outpatient dialysis treatments at [facility] on a MWF schedule, supervised by [facility] nephrologist. Patient is seen and evaluated during dialysis.    Patient seen sitting up in bed Completely alert and oriented.  Awaiting breakfast Denies pain  Objective:  Vital signs in last 24 hours:  Temp:  [97.6 F (36.4 C)-98.6 F (37 C)] 98 F (36.7 C) (11/01 1032) Pulse Rate:  [56-68] 58 (11/01 1032) Resp:  [16-32] 17 (11/01 1032) BP: (99-191)/(44-110) 136/70 (11/01 1032) SpO2:  [98 %-100 %] 100 % (11/01 0446) Weight:  [60.9 kg-62.4 kg] 60.9 kg (11/01 1032)  Weight change: -1.5 kg Filed Weights   03/24/24 1400 03/25/24 0446 03/25/24 1032  Weight: 61 kg 62.4 kg 60.9 kg    Intake/Output: No intake/output data recorded.   Intake/Output this shift:  No intake/output data recorded.  Physical Exam: General: NAD  Head: Normocephalic, atraumatic. Moist oral mucosal membranes  Eyes: Anicteric  Lungs:  Clear to auscultation, normal effort  Heart: Regular rate and rhythm  Abdomen:  Soft, nontender  Extremities:  No peripheral edema.  Neurologic: Awake, alert, fully oriented  Skin: Warm,dry, no rash  Access: RT internal jugular permcath    Basic Metabolic Panel: Recent Labs  Lab 03/23/24 1717 03/24/24 0428  NA 135 138  K 4.5 4.0  CL 94* 97*  CO2 26 28  GLUCOSE 269* 157*  BUN 17  20  CREATININE 6.31* 7.14*  CALCIUM  9.4 8.6*    Liver Function Tests: Recent Labs  Lab 03/23/24 1717  AST 22  ALT 17  ALKPHOS 343*  BILITOT 1.0  PROT 8.1  ALBUMIN 3.7   No results for input(s): LIPASE, AMYLASE in the last 168 hours. No results for input(s): AMMONIA in the last 168 hours.  CBC: Recent Labs  Lab 03/23/24 1717 03/24/24 0428 03/25/24 1050  WBC 12.2* 11.3* 10.9*  NEUTROABS 9.7*  --   --   HGB 11.1* 9.7* 8.8*  HCT 35.2* 29.3* 27.7*  MCV 94.4 92.7 93.6  PLT 462* 413* 334    Cardiac Enzymes: No results for input(s): CKTOTAL, CKMB, CKMBINDEX, TROPONINI in the last 168 hours.  BNP: Invalid input(s): POCBNP  CBG: Recent Labs  Lab 03/23/24 2257 03/24/24 0753 03/24/24 1657 03/24/24 2033 03/25/24 0748  GLUCAP 229* 163* 150* 181* 92    Microbiology: Results for orders placed or performed during the hospital encounter of 04/10/22  Resp Panel by RT-PCR (Flu A&B, Covid) Anterior Nasal Swab     Status: None   Collection Time: 04/10/22  3:25 PM   Specimen: Anterior Nasal Swab  Result Value Ref Range Status   SARS Coronavirus 2 by RT PCR NEGATIVE NEGATIVE Final    Comment: (NOTE) SARS-CoV-2 target nucleic acids are NOT DETECTED.  The SARS-CoV-2 RNA is generally detectable in upper respiratory specimens during the acute phase of infection. The lowest concentration of SARS-CoV-2 viral copies this assay can detect is 138 copies/mL. A negative result does not preclude SARS-Cov-2 infection and should  not be used as the sole basis for treatment or other patient management decisions. A negative result may occur with  improper specimen collection/handling, submission of specimen other than nasopharyngeal swab, presence of viral mutation(s) within the areas targeted by this assay, and inadequate number of viral copies(<138 copies/mL). A negative result must be combined with clinical observations, patient history, and  epidemiological information. The expected result is Negative.  Fact Sheet for Patients:  bloggercourse.com  Fact Sheet for Healthcare Providers:  seriousbroker.it  This test is no t yet approved or cleared by the [place]  FDA and  has been authorized for detection and/or diagnosis of SARS-CoV-2 by FDA under an Emergency Use Authorization (EUA). This EUA will remain  in effect (meaning this test can be used) for the duration of the COVID-19 declaration under Section 564(b)(1) of the Act, 21 U.S.C.section 360bbb-3(b)(1), unless the authorization is terminated  or revoked sooner.       Influenza A by PCR NEGATIVE NEGATIVE Final   Influenza B by PCR NEGATIVE NEGATIVE Final    Comment: (NOTE) The Xpert Xpress SARS-CoV-2/FLU/RSV plus assay is intended as an aid in the diagnosis of influenza from Nasopharyngeal swab specimens and should not be used as a sole basis for treatment. Nasal washings and aspirates are unacceptable for Xpert Xpress SARS-CoV-2/FLU/RSV testing.  Fact Sheet for Patients: bloggercourse.com  Fact Sheet for Healthcare Providers: seriousbroker.it  This test is not yet approved or cleared by the [place]  FDA and has been authorized for detection and/or diagnosis of SARS-CoV-2 by FDA under an Emergency Use Authorization (EUA). This EUA will remain in effect (meaning this test can be used) for the duration of the COVID-19 declaration under Section 564(b)(1) of the Act, 21 U.S.C. section 360bbb-3(b)(1), unless the authorization is terminated or revoked.  Performed at [facility], [address] [place], [place] [address]   Blood culture (routine x 2)     Status: None   Collection Time: 04/10/22  4:37 PM   Specimen: BLOOD  Result Value Ref Range Status   Specimen Description BLOOD BLOOD LEFT ARM  Final   Special Requests   Final    BOTTLES  DRAWN AEROBIC AND ANAEROBIC Blood Culture adequate volume   Culture   Final    NO GROWTH 5 DAYS Performed at [facility], [address] [place], [place] [address]    Report Status 04/15/2022 FINAL  Final  Blood culture (routine x 2)     Status: None   Collection Time: 04/10/22  4:37 PM   Specimen: BLOOD  Result Value Ref Range Status   Specimen Description BLOOD BLOOD LEFT ARM  Final   Special Requests   Final    BOTTLES DRAWN AEROBIC AND ANAEROBIC Blood Culture adequate volume   Culture   Final    NO GROWTH 5 DAYS Performed at [facility], [address], [place], [place] [address]    Report Status 04/15/2022 FINAL  Final  MRSA Next Gen by PCR, Nasal     Status: None   Collection Time: 04/10/22  6:53 PM   Specimen: Nasal Mucosa; Nasal Swab  Result Value Ref Range Status   MRSA by PCR Next Gen NOT DETECTED NOT DETECTED Final    Comment: (NOTE) The GeneXpert MRSA Assay (FDA approved for NASAL specimens only), is one component of a comprehensive MRSA colonization surveillance program. It is not intended to diagnose MRSA infection nor to guide or monitor treatment for MRSA infections. Test performance is not FDA  approved in patients less than 81 years old. Performed at [facility], [address] [place], [place] [address]   C Difficile Quick Screen w PCR reflex     Status: None   Collection Time: 04/12/22 10:00 AM   Specimen: STOOL  Result Value Ref Range Status   C Diff antigen NEGATIVE NEGATIVE Final   C Diff toxin NEGATIVE NEGATIVE Final   C Diff interpretation No C. difficile detected.  Final    Comment: Performed at [facility], [address] [place], [place] [address]  Respiratory (~20 pathogens) panel by PCR     Status: None   Collection Time: 04/13/22  5:15 AM   Specimen: Nasopharyngeal Swab; Respiratory  Result Value Ref Range Status   Adenovirus NOT DETECTED NOT DETECTED Final   Coronavirus 229E NOT DETECTED NOT  DETECTED Final    Comment: (NOTE) The Coronavirus on the Respiratory Panel, DOES NOT test for the novel  Coronavirus (2019 nCoV)    Coronavirus HKU1 NOT DETECTED NOT DETECTED Final   Coronavirus NL63 NOT DETECTED NOT DETECTED Final   Coronavirus OC43 NOT DETECTED NOT DETECTED Final   Metapneumovirus NOT DETECTED NOT DETECTED Final   Rhinovirus / Enterovirus NOT DETECTED NOT DETECTED Final   Influenza A NOT DETECTED NOT DETECTED Final   Influenza B NOT DETECTED NOT DETECTED Final   Parainfluenza Virus 1 NOT DETECTED NOT DETECTED Final   Parainfluenza Virus 2 NOT DETECTED NOT DETECTED Final   Parainfluenza Virus 3 NOT DETECTED NOT DETECTED Final   Parainfluenza Virus 4 NOT DETECTED NOT DETECTED Final   Respiratory Syncytial Virus NOT DETECTED NOT DETECTED Final   Bordetella pertussis NOT DETECTED NOT DETECTED Final   Bordetella Parapertussis NOT DETECTED NOT DETECTED Final   Chlamydophila pneumoniae NOT DETECTED NOT DETECTED Final   Mycoplasma pneumoniae NOT DETECTED NOT DETECTED Final    Comment: Performed at [facility], [address] [place], [place] [address]    Coagulation Studies: No results for input(s): LABPROT, INR in the last 72 hours.  Urinalysis: No results for input(s): COLORURINE, LABSPEC, PHURINE, GLUCOSEU, HGBUR, BILIRUBINUR, KETONESUR, PROTEINUR, UROBILINOGEN, NITRITE, LEUKOCYTESUR in the last 72 hours.  Invalid input(s): APPERANCEUR    Imaging: CT HEAD WO CONTRAST ( ) Result Date: 03/23/2024 CLINICAL DATA:  Headache, neuro deficit EXAM: CT HEAD WITHOUT CONTRAST TECHNIQUE: Contiguous axial images were obtained from the base of the skull through the vertex without intravenous contrast. RADIATION DOSE REDUCTION: This exam was performed according to the departmental dose-optimization program which includes automated exposure control, adjustment of the mA and/or kV according to patient size and/or use of iterative reconstruction  technique. COMPARISON:  Head CT 04/13/2022 FINDINGS: Brain: No intracranial hemorrhage, mass effect, or midline shift. Stable degree of atrophy and chronic small vessel ischemia. No hydrocephalus. The basilar cisterns are patent. Remote right occipital infarct, unchanged. Chronic mineralization in the basal ganglia. No evidence of territorial infarct or acute ischemia. No extra-axial or intracranial fluid collection. Vascular: Atherosclerosis of skullbase vasculature without hyperdense vessel or abnormal calcification. Skull: No fracture or focal lesion. Sinuses/Orbits: Mucous retention cysts in both maxillary sinuses. Occasional opacification of left mastoid air cells, unchanged. Other: None. IMPRESSION: 1. No acute intracranial abnormality. 2. Stable atrophy and chronic small vessel ischemia. Remote right occipital infarct. Electronically Signed   By: Andrea Gasman M.D.   On: 03/23/2024 17:37     Medications:     amLODipine   10 mg Oral Daily   aspirin  EC  81 mg Oral Daily  atorvastatin   80 mg Oral QPM   calcium  acetate  667 mg Oral Q supper   carvedilol  12.5 mg Oral BID WC   cetirizine  10 mg Oral QPM   Chlorhexidine  Gluconate Cloth  6 each Topical Q0600   cholecalciferol   5,000 Units Oral Daily   clopidogrel   75 mg Oral Daily   donepezil   10 mg Oral Daily   heparin   5,000 Units Subcutaneous Q8H   insulin  aspart  0-5 Units Subcutaneous QHS   insulin  aspart  0-9 Units Subcutaneous TID WC   insulin  glargine-yfgn  3 Units Subcutaneous QHS   levothyroxine   137 mcg Oral Q0600   losartan   25 mg Oral Daily   pantoprazole   40 mg Oral Daily   acetaminophen , albuterol , dextromethorphan-guaiFENesin , hydrALAZINE , ondansetron  (ZOFRAN ) IV  Assessment/ Plan:  [name] is a 78 y.o.  male with a past medical history of ESRD, on hemodialysis on Tuesday Thursday Saturday schedule, diabetes mellitus type 2, CVA, hypothyroidism who came to the ER with chief complaint of altered mental status and  has been admitted under observation for Altered mental status, unspecified altered mental status type [R41.82] Acute metabolic encephalopathy [G93.41] Adverse effect of drug, initial encounter [T50.905A]  [facility]/MWF/right chest PermCath   Acute Metabolic Encephalopathy. Recent new medication, baclofen. Likely cause of this. CT head negative. Workup in progress. Baclofen held.  Mentation has improved.   2. End stage renal disease on hemodialysis. Received dialysis yesterday with no UF. Mentation has improved. Patient requested additional treatment today for additional clearance.   3. Anemia of chronic kidney disease Lab Results  Component Value Date   HGB 8.8 (L) 03/25/2024    Hgb 8.8. Burrell Soulier at outpatient clinic.   4. 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 Home regimen includes Novolog . Most recent hemoglobin A1c is 7.5 on 01/26/24.    5. Secondary Hyperparathyroidism: with outpatient labs: PTH 469, phosphorus 2.9, calcium  9.3 on 02/28/24.   Lab Results  Component Value Date   CALCIUM  8.6 (L) 03/24/2024   CAION 1.16 05/13/2023   PHOS 2.6 04/14/2022     Patient prescribed calcium  acetate with meals outpatient along with calcitriol. Will continue to monitor bone minerals.    LOS: 0 Valissa Lyvers 11/1/202511:05 AM

## 2024-03-26 LAB — T4: T4, Total: 10.4 ug/dL (ref 4.5–12.0)

## 2024-03-27 DIAGNOSIS — N2581 Secondary hyperparathyroidism of renal origin: Secondary | ICD-10-CM | POA: Diagnosis not present

## 2024-03-27 DIAGNOSIS — D631 Anemia in chronic kidney disease: Secondary | ICD-10-CM | POA: Diagnosis not present

## 2024-03-27 DIAGNOSIS — Z992 Dependence on renal dialysis: Secondary | ICD-10-CM | POA: Diagnosis not present

## 2024-03-27 DIAGNOSIS — D509 Iron deficiency anemia, unspecified: Secondary | ICD-10-CM | POA: Diagnosis not present

## 2024-03-27 DIAGNOSIS — N186 End stage renal disease: Secondary | ICD-10-CM | POA: Diagnosis not present

## 2024-03-27 DIAGNOSIS — D689 Coagulation defect, unspecified: Secondary | ICD-10-CM | POA: Diagnosis not present

## 2024-03-28 DIAGNOSIS — D72829 Elevated white blood cell count, unspecified: Secondary | ICD-10-CM | POA: Diagnosis not present

## 2024-03-28 DIAGNOSIS — E039 Hypothyroidism, unspecified: Secondary | ICD-10-CM | POA: Diagnosis not present

## 2024-03-28 DIAGNOSIS — Z9181 History of falling: Secondary | ICD-10-CM | POA: Diagnosis not present

## 2024-03-28 DIAGNOSIS — Z87891 Personal history of nicotine dependence: Secondary | ICD-10-CM | POA: Diagnosis not present

## 2024-03-28 DIAGNOSIS — Z86711 Personal history of pulmonary embolism: Secondary | ICD-10-CM | POA: Diagnosis not present

## 2024-03-28 DIAGNOSIS — G9341 Metabolic encephalopathy: Secondary | ICD-10-CM | POA: Diagnosis not present

## 2024-03-28 DIAGNOSIS — Z7902 Long term (current) use of antithrombotics/antiplatelets: Secondary | ICD-10-CM | POA: Diagnosis not present

## 2024-03-28 DIAGNOSIS — F0280 Dementia in other diseases classified elsewhere without behavioral disturbance: Secondary | ICD-10-CM | POA: Diagnosis not present

## 2024-03-28 DIAGNOSIS — G301 Alzheimer's disease with late onset: Secondary | ICD-10-CM | POA: Diagnosis not present

## 2024-03-28 DIAGNOSIS — I2510 Atherosclerotic heart disease of native coronary artery without angina pectoris: Secondary | ICD-10-CM | POA: Diagnosis not present

## 2024-03-28 DIAGNOSIS — D631 Anemia in chronic kidney disease: Secondary | ICD-10-CM | POA: Diagnosis not present

## 2024-03-28 DIAGNOSIS — Z8673 Personal history of transient ischemic attack (TIA), and cerebral infarction without residual deficits: Secondary | ICD-10-CM | POA: Diagnosis not present

## 2024-03-28 DIAGNOSIS — Z7982 Long term (current) use of aspirin: Secondary | ICD-10-CM | POA: Diagnosis not present

## 2024-03-28 DIAGNOSIS — K219 Gastro-esophageal reflux disease without esophagitis: Secondary | ICD-10-CM | POA: Diagnosis not present

## 2024-03-28 DIAGNOSIS — E782 Mixed hyperlipidemia: Secondary | ICD-10-CM | POA: Diagnosis not present

## 2024-03-28 DIAGNOSIS — Z794 Long term (current) use of insulin: Secondary | ICD-10-CM | POA: Diagnosis not present

## 2024-03-28 DIAGNOSIS — I110 Hypertensive heart disease with heart failure: Secondary | ICD-10-CM | POA: Diagnosis not present

## 2024-03-28 DIAGNOSIS — I5022 Chronic systolic (congestive) heart failure: Secondary | ICD-10-CM | POA: Diagnosis not present

## 2024-03-28 DIAGNOSIS — Z951 Presence of aortocoronary bypass graft: Secondary | ICD-10-CM | POA: Diagnosis not present

## 2024-03-28 DIAGNOSIS — N186 End stage renal disease: Secondary | ICD-10-CM | POA: Diagnosis not present

## 2024-03-28 DIAGNOSIS — E1122 Type 2 diabetes mellitus with diabetic chronic kidney disease: Secondary | ICD-10-CM | POA: Diagnosis not present

## 2024-03-28 DIAGNOSIS — Z992 Dependence on renal dialysis: Secondary | ICD-10-CM | POA: Diagnosis not present

## 2024-04-03 DIAGNOSIS — N2581 Secondary hyperparathyroidism of renal origin: Secondary | ICD-10-CM | POA: Diagnosis not present

## 2024-04-03 DIAGNOSIS — N186 End stage renal disease: Secondary | ICD-10-CM | POA: Diagnosis not present

## 2024-04-03 DIAGNOSIS — Z992 Dependence on renal dialysis: Secondary | ICD-10-CM | POA: Diagnosis not present

## 2024-04-03 DIAGNOSIS — D509 Iron deficiency anemia, unspecified: Secondary | ICD-10-CM | POA: Diagnosis not present

## 2024-04-03 DIAGNOSIS — D689 Coagulation defect, unspecified: Secondary | ICD-10-CM | POA: Diagnosis not present

## 2024-04-07 DIAGNOSIS — G9341 Metabolic encephalopathy: Secondary | ICD-10-CM | POA: Diagnosis not present

## 2024-04-07 DIAGNOSIS — E1122 Type 2 diabetes mellitus with diabetic chronic kidney disease: Secondary | ICD-10-CM | POA: Diagnosis not present

## 2024-04-07 DIAGNOSIS — D631 Anemia in chronic kidney disease: Secondary | ICD-10-CM | POA: Diagnosis not present

## 2024-04-07 DIAGNOSIS — N186 End stage renal disease: Secondary | ICD-10-CM | POA: Diagnosis not present

## 2024-04-07 DIAGNOSIS — I5022 Chronic systolic (congestive) heart failure: Secondary | ICD-10-CM | POA: Diagnosis not present

## 2024-04-07 DIAGNOSIS — E782 Mixed hyperlipidemia: Secondary | ICD-10-CM | POA: Diagnosis not present

## 2024-04-07 DIAGNOSIS — I110 Hypertensive heart disease with heart failure: Secondary | ICD-10-CM | POA: Diagnosis not present

## 2024-04-10 DIAGNOSIS — Z992 Dependence on renal dialysis: Secondary | ICD-10-CM | POA: Diagnosis not present

## 2024-04-10 DIAGNOSIS — N2581 Secondary hyperparathyroidism of renal origin: Secondary | ICD-10-CM | POA: Diagnosis not present

## 2024-04-10 DIAGNOSIS — D631 Anemia in chronic kidney disease: Secondary | ICD-10-CM | POA: Diagnosis not present

## 2024-04-10 DIAGNOSIS — N186 End stage renal disease: Secondary | ICD-10-CM | POA: Diagnosis not present

## 2024-04-10 DIAGNOSIS — D689 Coagulation defect, unspecified: Secondary | ICD-10-CM | POA: Diagnosis not present

## 2024-04-10 DIAGNOSIS — D509 Iron deficiency anemia, unspecified: Secondary | ICD-10-CM | POA: Diagnosis not present

## 2024-04-17 DIAGNOSIS — D689 Coagulation defect, unspecified: Secondary | ICD-10-CM | POA: Diagnosis not present

## 2024-04-17 DIAGNOSIS — Z992 Dependence on renal dialysis: Secondary | ICD-10-CM | POA: Diagnosis not present

## 2024-04-17 DIAGNOSIS — N186 End stage renal disease: Secondary | ICD-10-CM | POA: Diagnosis not present

## 2024-04-17 DIAGNOSIS — N2581 Secondary hyperparathyroidism of renal origin: Secondary | ICD-10-CM | POA: Diagnosis not present

## 2024-04-17 DIAGNOSIS — D509 Iron deficiency anemia, unspecified: Secondary | ICD-10-CM | POA: Diagnosis not present

## 2024-04-23 DIAGNOSIS — Z992 Dependence on renal dialysis: Secondary | ICD-10-CM | POA: Diagnosis not present

## 2024-04-23 DIAGNOSIS — E1122 Type 2 diabetes mellitus with diabetic chronic kidney disease: Secondary | ICD-10-CM | POA: Diagnosis not present

## 2024-04-23 DIAGNOSIS — N186 End stage renal disease: Secondary | ICD-10-CM | POA: Diagnosis not present

## 2024-04-24 DIAGNOSIS — Z992 Dependence on renal dialysis: Secondary | ICD-10-CM | POA: Diagnosis not present

## 2024-04-24 DIAGNOSIS — N2581 Secondary hyperparathyroidism of renal origin: Secondary | ICD-10-CM | POA: Diagnosis not present

## 2024-04-24 DIAGNOSIS — N186 End stage renal disease: Secondary | ICD-10-CM | POA: Diagnosis not present

## 2024-04-24 DIAGNOSIS — D689 Coagulation defect, unspecified: Secondary | ICD-10-CM | POA: Diagnosis not present
